# Patient Record
Sex: Female | Born: 1940 | Race: Black or African American | Hispanic: No | Marital: Married | Smoking: Never smoker
Health system: Southern US, Community
[De-identification: ages and names within clinical notes are randomized; demographics above are authoritative.]

## PROBLEM LIST (undated history)

## (undated) DIAGNOSIS — I471 Supraventricular tachycardia, unspecified: Secondary | ICD-10-CM

## (undated) DIAGNOSIS — IMO0001 Reserved for inherently not codable concepts without codable children: Secondary | ICD-10-CM

## (undated) DIAGNOSIS — I119 Hypertensive heart disease without heart failure: Secondary | ICD-10-CM

## (undated) DIAGNOSIS — I5189 Other ill-defined heart diseases: Secondary | ICD-10-CM

## (undated) DIAGNOSIS — E119 Type 2 diabetes mellitus without complications: Secondary | ICD-10-CM

## (undated) DIAGNOSIS — I2510 Atherosclerotic heart disease of native coronary artery without angina pectoris: Secondary | ICD-10-CM

## (undated) DIAGNOSIS — I739 Peripheral vascular disease, unspecified: Secondary | ICD-10-CM

## (undated) DIAGNOSIS — I639 Cerebral infarction, unspecified: Secondary | ICD-10-CM

## (undated) DIAGNOSIS — E785 Hyperlipidemia, unspecified: Secondary | ICD-10-CM

## (undated) DIAGNOSIS — Z794 Long term (current) use of insulin: Secondary | ICD-10-CM

## (undated) HISTORY — DX: Hyperlipidemia, unspecified: E78.5

## (undated) HISTORY — DX: Peripheral vascular disease, unspecified: I73.9

## (undated) HISTORY — DX: Long term (current) use of insulin: Z79.4

## (undated) HISTORY — PX: EYE SURGERY: SHX253

## (undated) HISTORY — DX: Cerebral infarction, unspecified: I63.9

## (undated) HISTORY — DX: Hypertensive heart disease without heart failure: I11.9

## (undated) HISTORY — DX: Reserved for inherently not codable concepts without codable children: IMO0001

## (undated) HISTORY — DX: Type 2 diabetes mellitus without complications: E11.9

## (undated) HISTORY — DX: Other ill-defined heart diseases: I51.89

## (undated) HISTORY — PX: ABDOMINAL HYSTERECTOMY: SHX81

## (undated) HISTORY — PX: CARDIAC CATHETERIZATION: SHX172

---

## 2004-10-04 ENCOUNTER — Emergency Department: Payer: Self-pay | Admitting: Emergency Medicine

## 2004-10-04 ENCOUNTER — Other Ambulatory Visit: Payer: Self-pay

## 2004-10-04 IMAGING — CT CT CHEST W/ CM
1 of 2 series · 16 of 32 positions shown, 20 images · IV contrast (APPLIED)
Comparison: none

REASON FOR EXAM: chest pain
COMMENTS:

[Series 6: inspace · axial · 0.85mm/px · z∈[-722,-428]mm · 16 of 463 slices shown, 20 images]
[im 22/463  mediastinal]
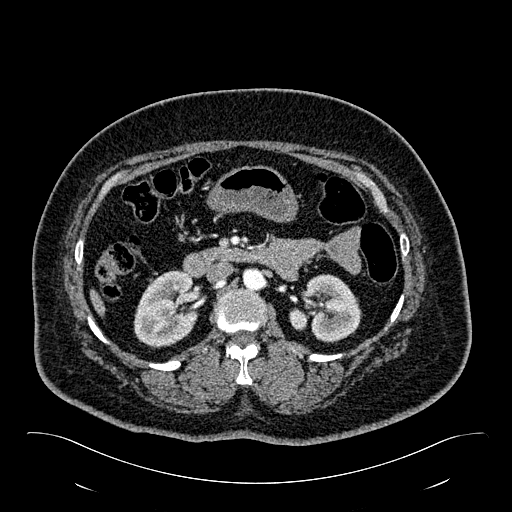
[im 22/463  lung]
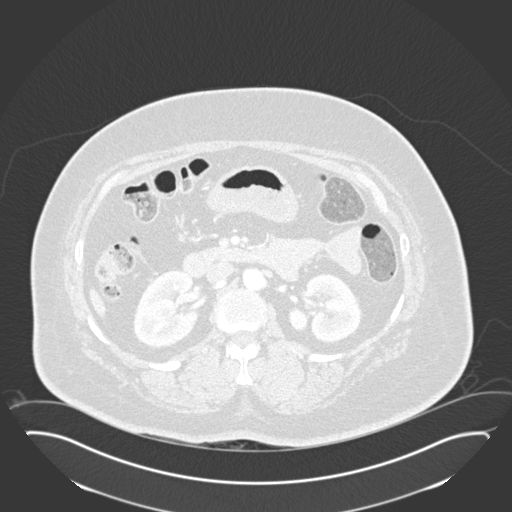
[im 64/463  lung]
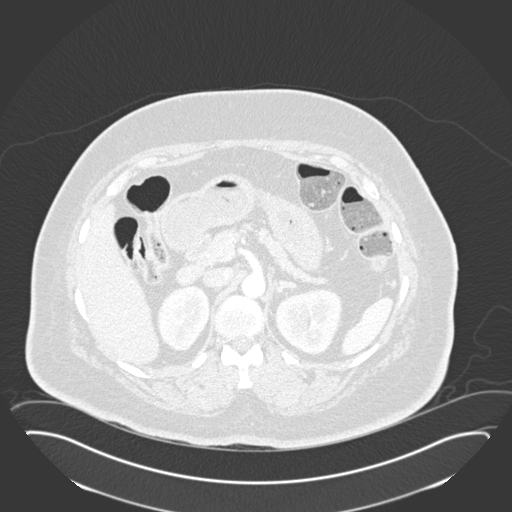
[im 85/463  lung]
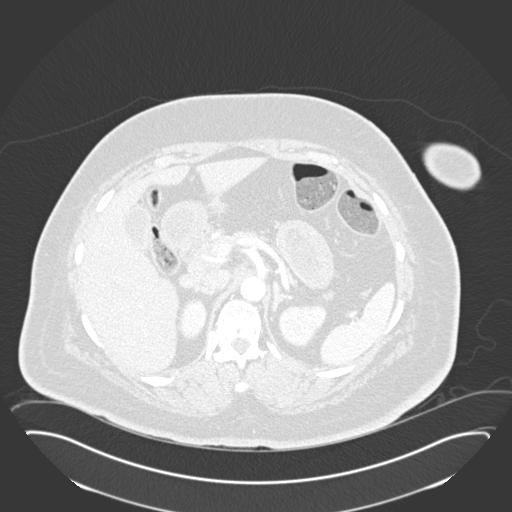
[im 116/463  lung]
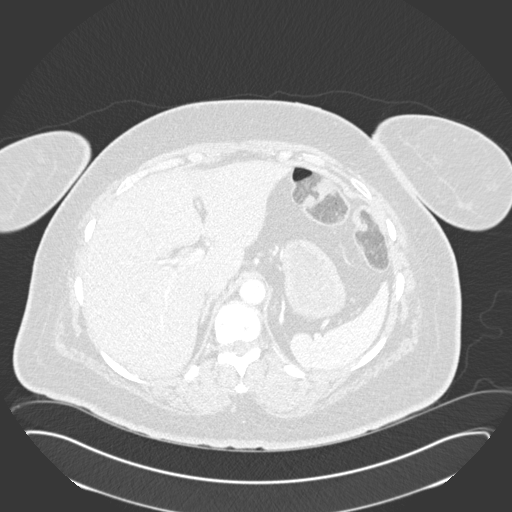
[im 127/463  mediastinal]
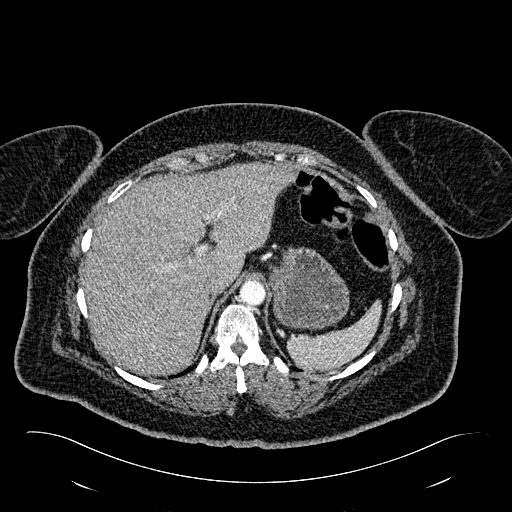
[im 127/463  lung]
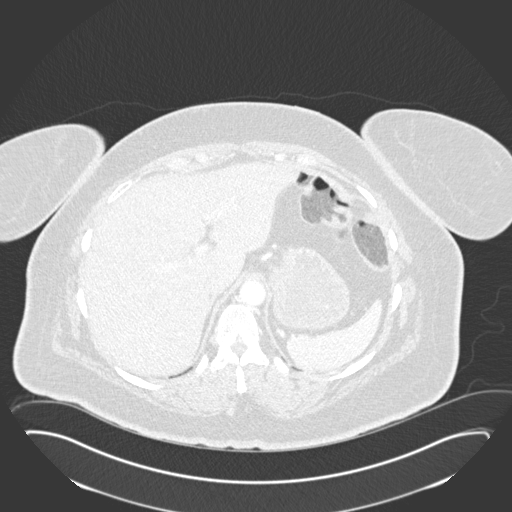
[im 169/463  lung]
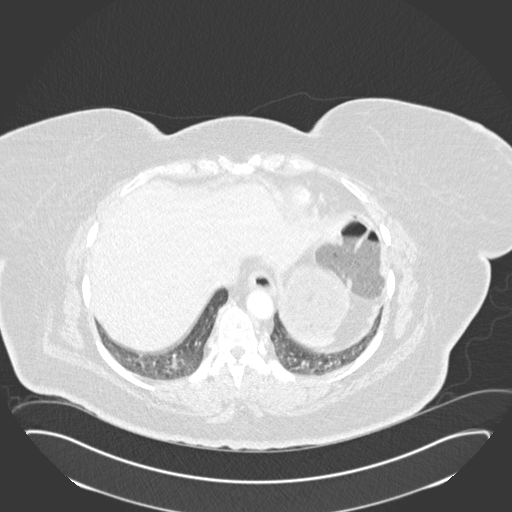
[im 190/463  lung]
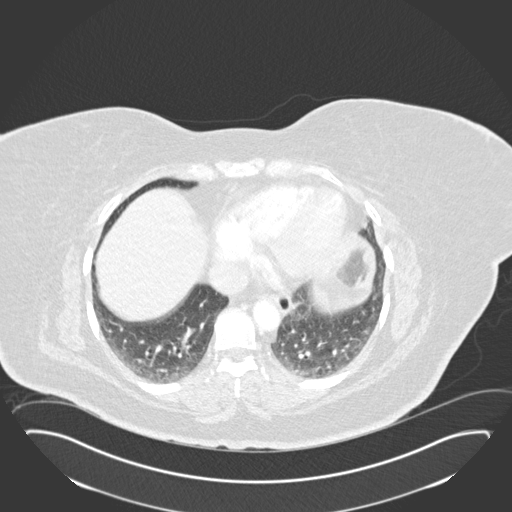
[im 218/463  lung]
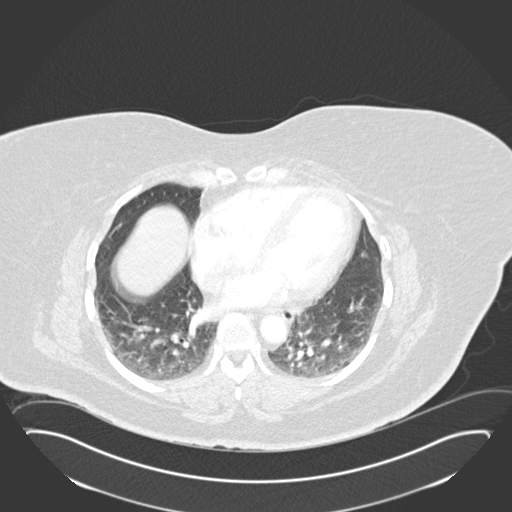
[im 232/463  mediastinal]
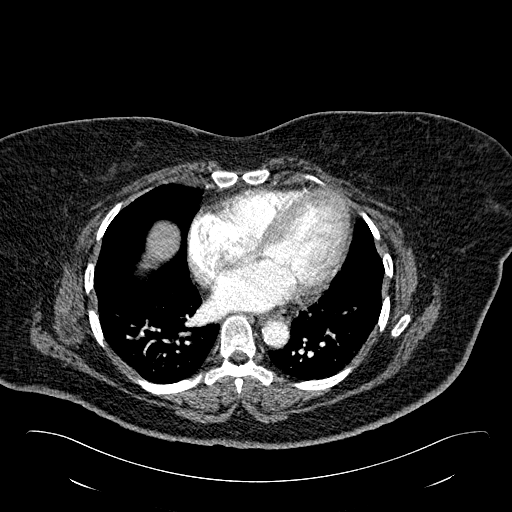
[im 232/463  lung]
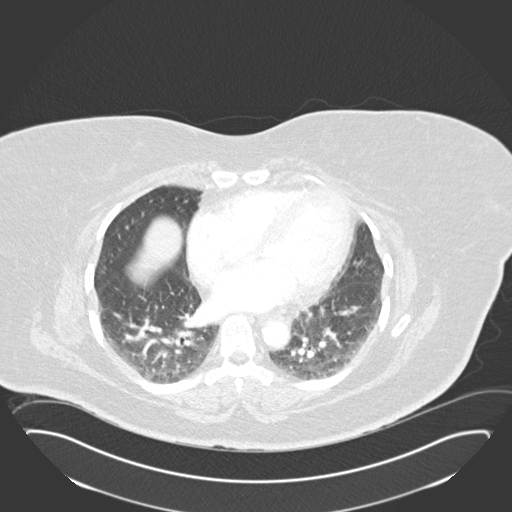
[im 274/463  lung]
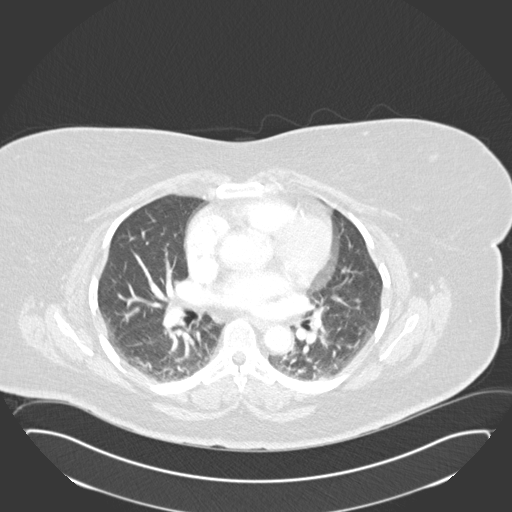
[im 295/463  lung]
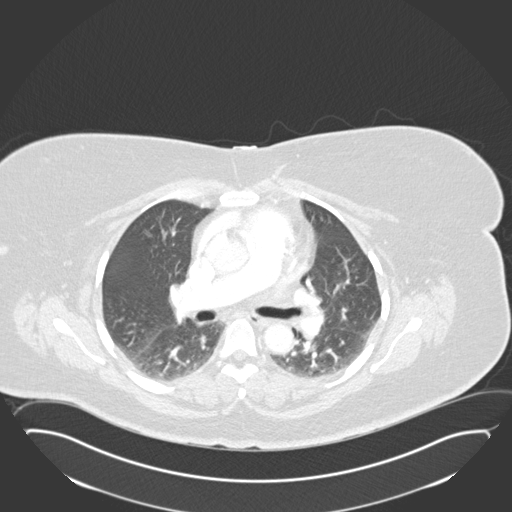
[im 337/463  lung]
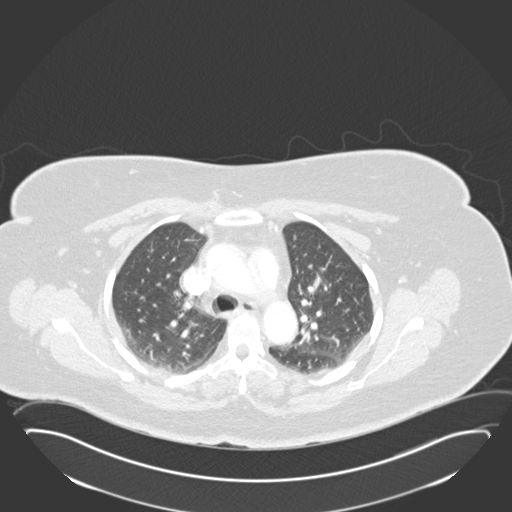
[im 347/463  mediastinal]
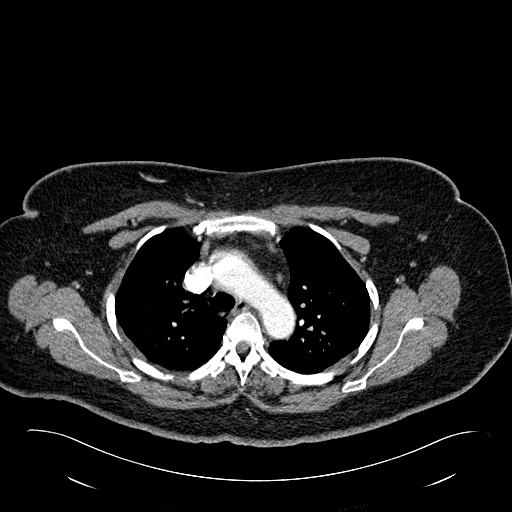
[im 347/463  lung]
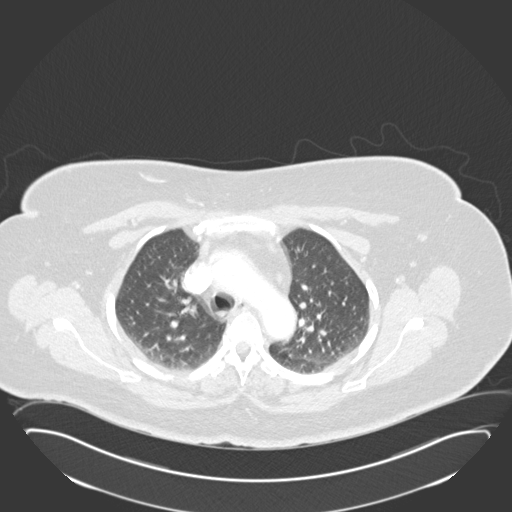
[im 379/463  lung]
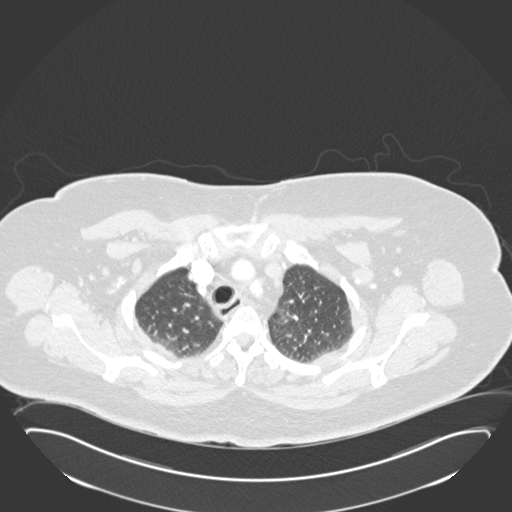
[im 400/463  lung]
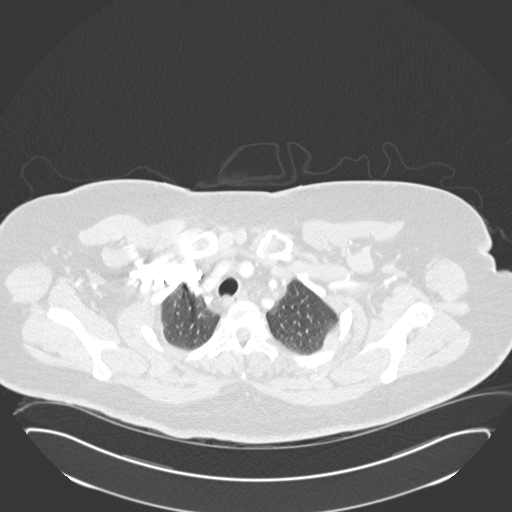
[im 442/463  lung]
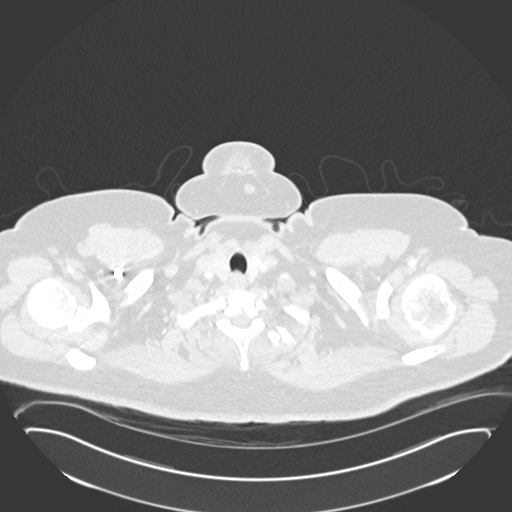

[16 of 32 positions shown; findings below may reference images not displayed]

PROCEDURE:     CT  - CT CHEST (FOR PE) W  - [DATE] [DATE]

RESULT:     3 mm helical cuts were performed through the chest with 100 ccs
of Isovue 370 contrast. No prior studies are available for comparison.  The
pulmonary arteries enhance normally without evidence of filling defect or
wall thickening.  No evidence of pleural or pericardial effusion. The lung
windows do not show evidence of a suspicious mass, nodule or pneumonia.
There is some subtle dependent atelectasis at both lung bases.  The thoracic
aorta tapers normally without evidence of aneurysm or dissection.  Limited
cuts through the upper abdomen do not show a suspicious solid organ
abnormality.
IMPRESSION: 1)No evidence of PE or other acute pulmonary abnormality.

2)No pleural or pericardial effusion or evidence of thoracic aortic aneurysm
or dissection.

3)No suspicious mass, nodule or pneumonia in either lung field.

## 2004-10-05 ENCOUNTER — Ambulatory Visit: Payer: Self-pay | Admitting: Emergency Medicine

## 2004-12-14 ENCOUNTER — Other Ambulatory Visit: Payer: Self-pay

## 2004-12-14 ENCOUNTER — Emergency Department: Payer: Self-pay | Admitting: Internal Medicine

## 2004-12-14 IMAGING — CT CT HEAD WITHOUT CONTRAST
1 series · 16 of 28 positions shown, 20 images · non-contrast
Comparison: none

REASON FOR EXAM: difficulty speaking, slurred speech [HOSPITAL]
COMMENTS:

[Series 2: without · axial · non-contrast · 0.39mm/px · z∈[+354,+480]mm · 16 of 28 slices shown, 20 images]
[im 2/28  brain]
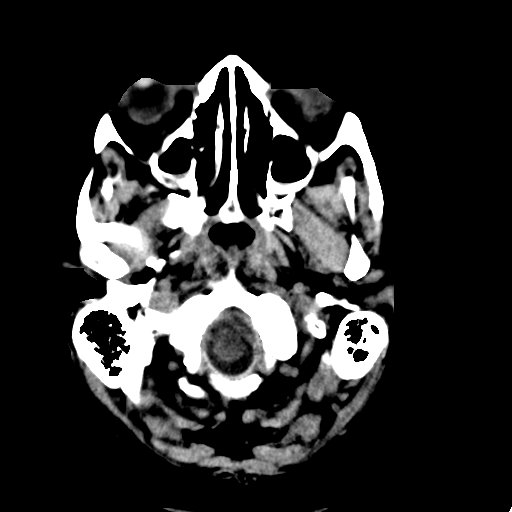
[im 2/28  bone]
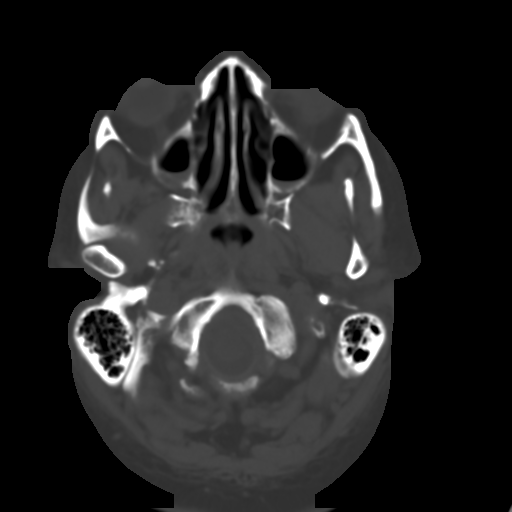
[im 4/28  brain]
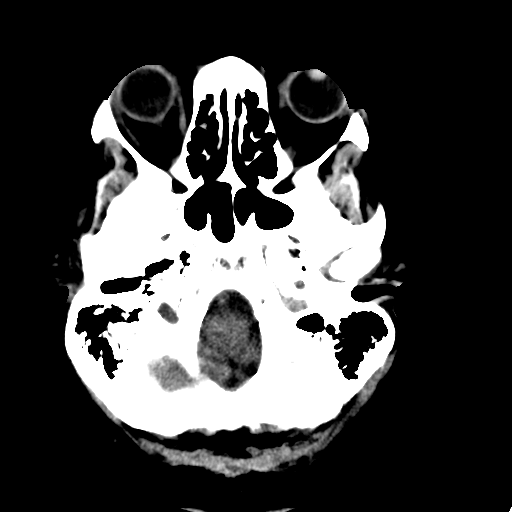
[im 6/28  brain]
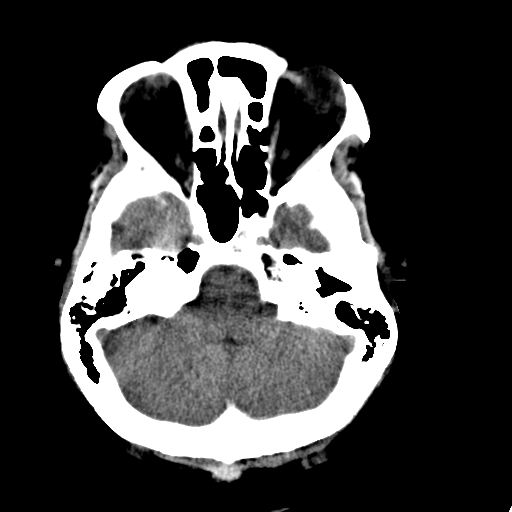
[im 7/28  brain]
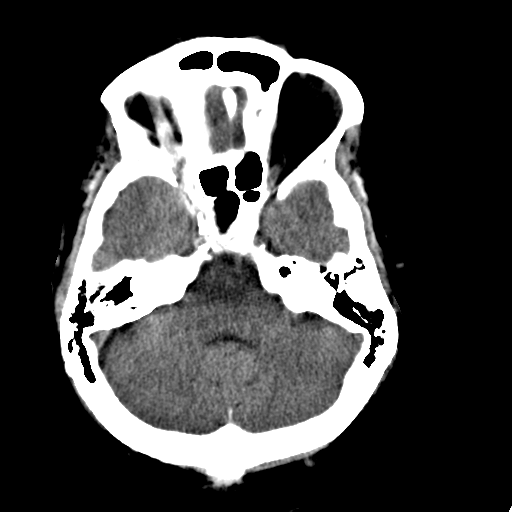
[im 9/28  brain]
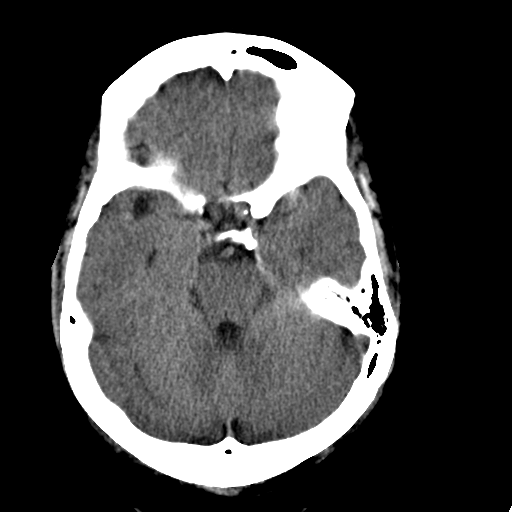
[im 9/28  bone]
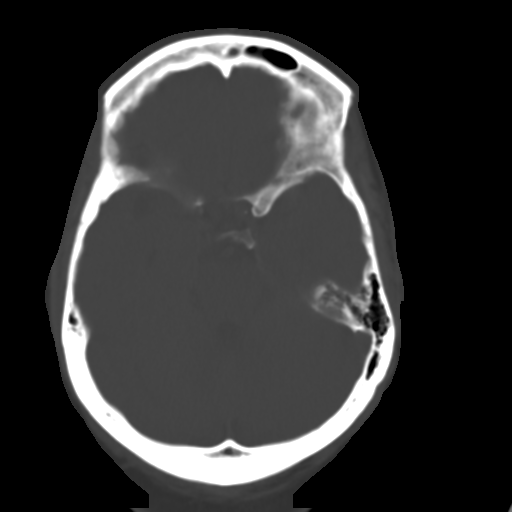
[im 10/28  brain]
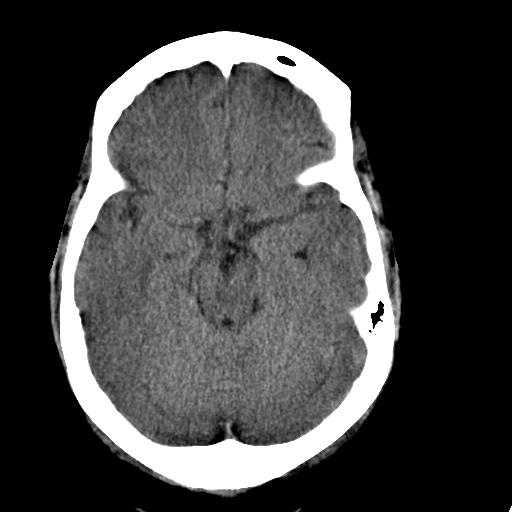
[im 12/28  brain]
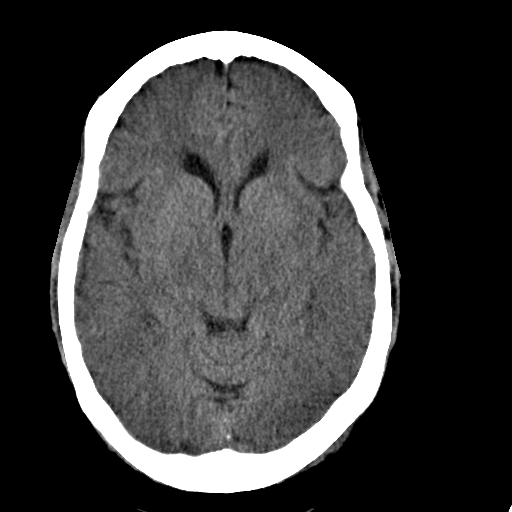
[im 14/28  brain]
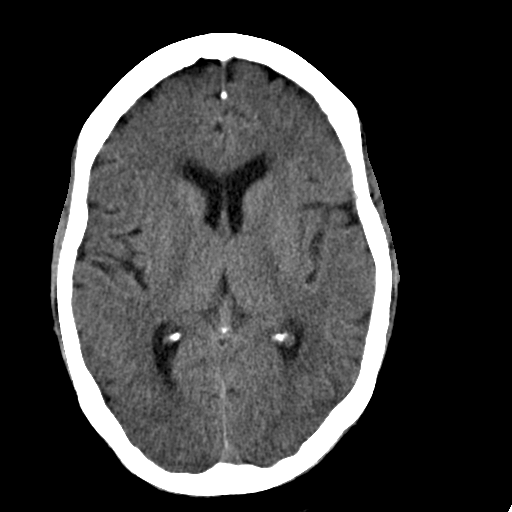
[im 15/28  brain]
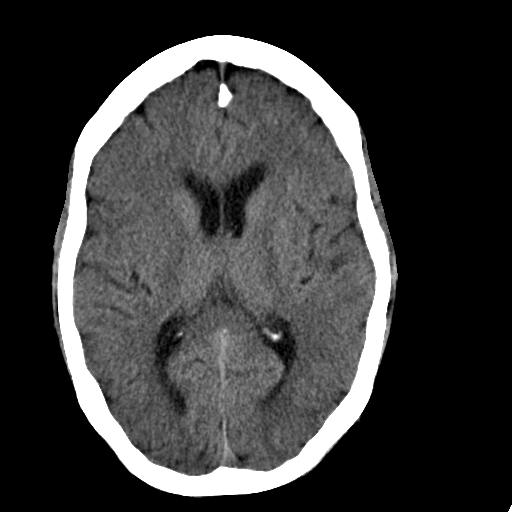
[im 15/28  bone]
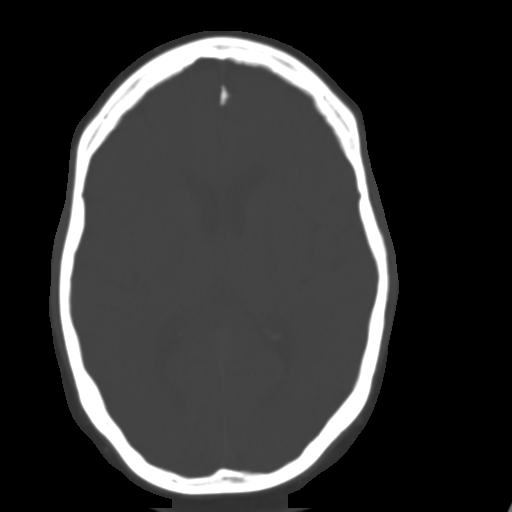
[im 17/28  brain]
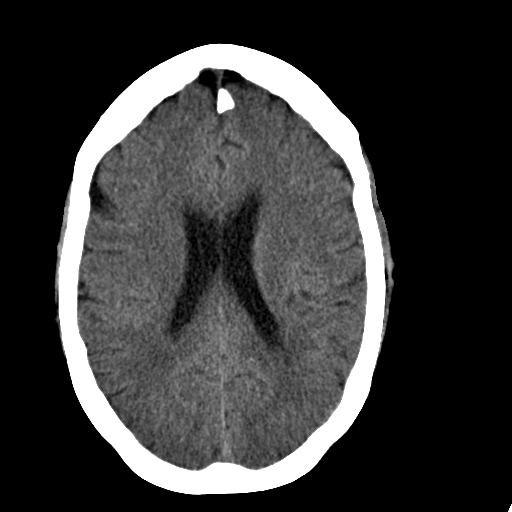
[im 19/28  brain]
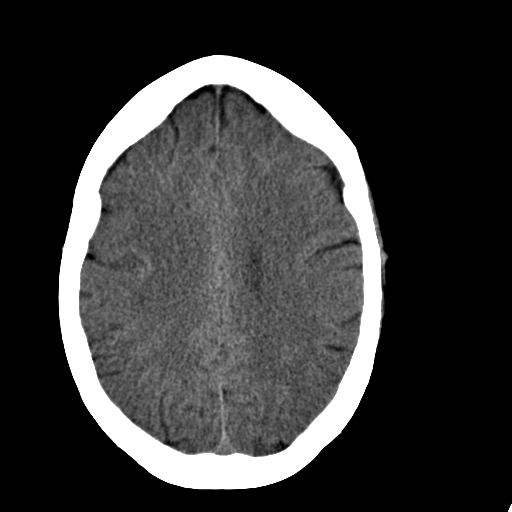
[im 20/28  brain]
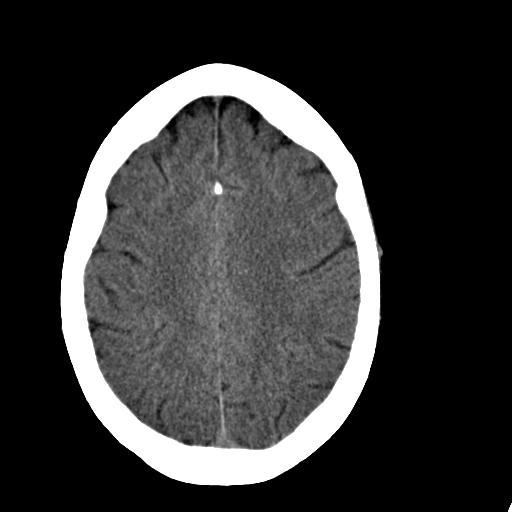
[im 22/28  brain]
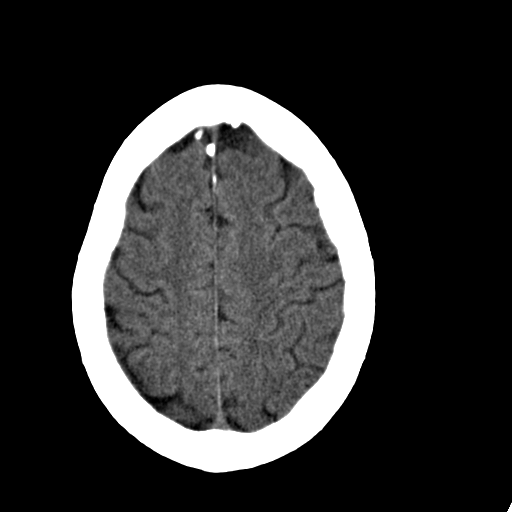
[im 22/28  bone]
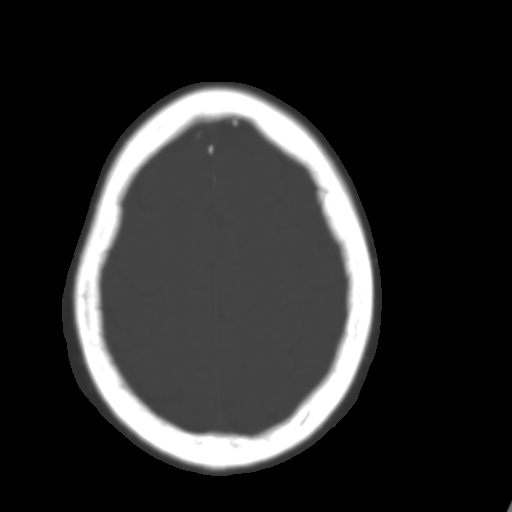
[im 23/28  brain]
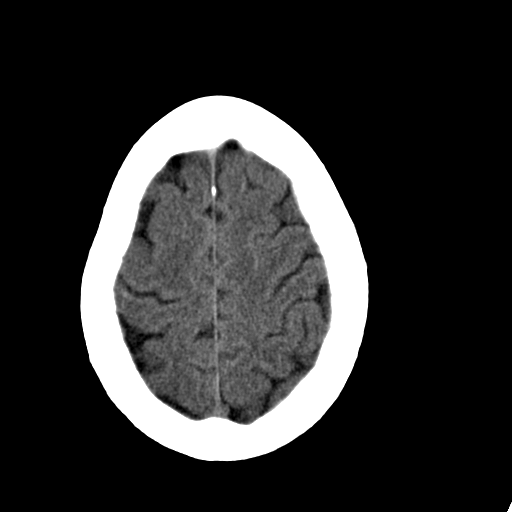
[im 25/28  brain]
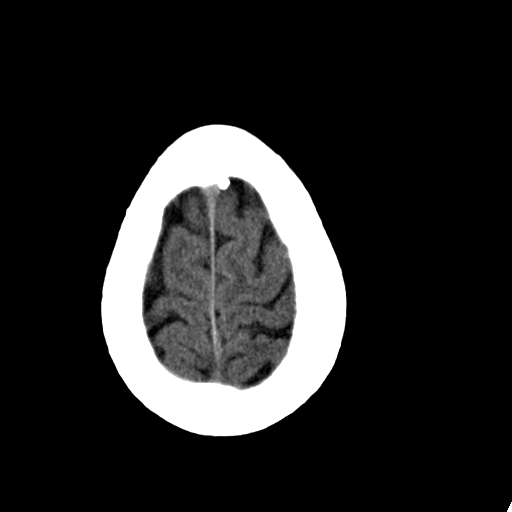
[im 27/28  brain]
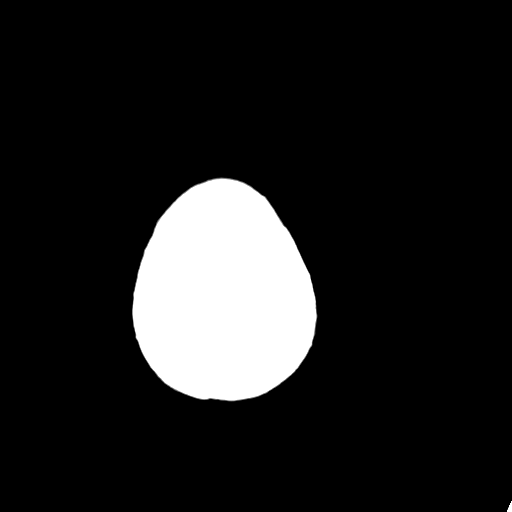

[16 of 28 positions shown; findings below may reference images not displayed]

PROCEDURE:     CT  - CT HEAD WITHOUT CONTRAST  - [DATE]  [DATE]

RESULT:     The patient reports speech disturbances.

The ventricles are normal in size and position. There is no evidence of a
mass nor mass effect.  I see no intracranial hemorrhage. The cerebellum and
brainstem are normal in density and position.  At bone window settings there
are noted to be air fluid levels in both maxillary sinuses. The visualized
portions of the other paranasal sinuses appear normal.
IMPRESSION: 1)I see no acute abnormality of the brain.  Specifically, I see no finding
to suggest an evolving stroke.

2)There is small air fluid levels in both maxillary sinuses.

## 2004-12-15 ENCOUNTER — Ambulatory Visit: Payer: Self-pay | Admitting: Internal Medicine

## 2004-12-15 IMAGING — US US CAROTID DUPLEX BILAT
1 series · 17 of 24 positions shown · non-contrast
Comparison: none

REASON FOR EXAM: Speech Difficulty TIA
COMMENTS:

[Series 1: us carotid duplex bilat · 17 of 56 slices shown]
[im 1/56]
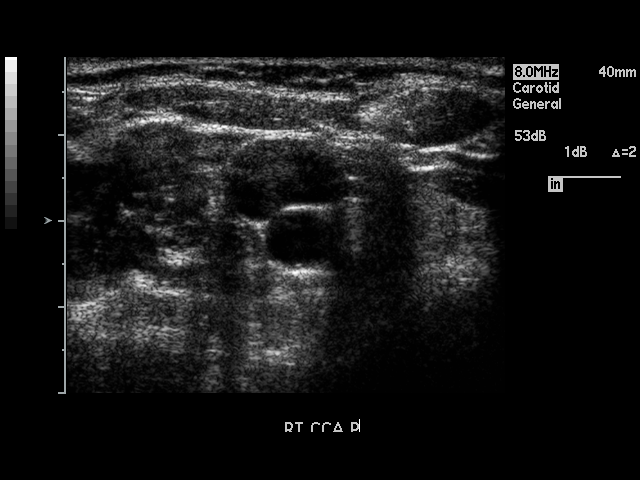
[im 5/56]
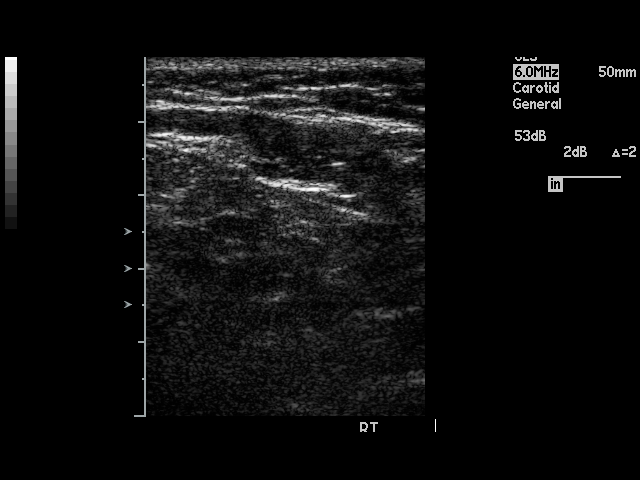
[im 8/56]
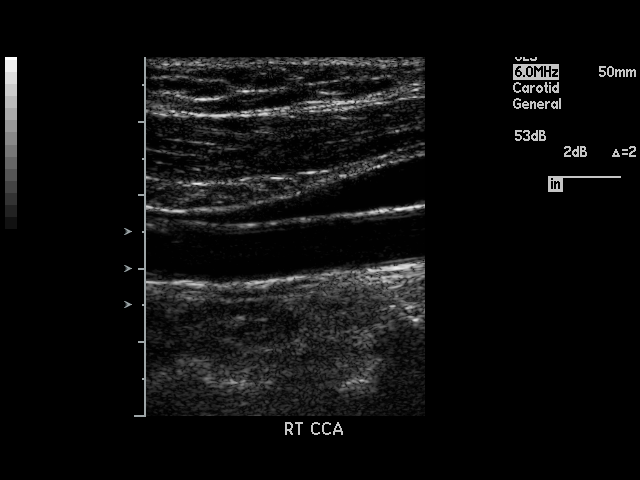
[im 10/56]
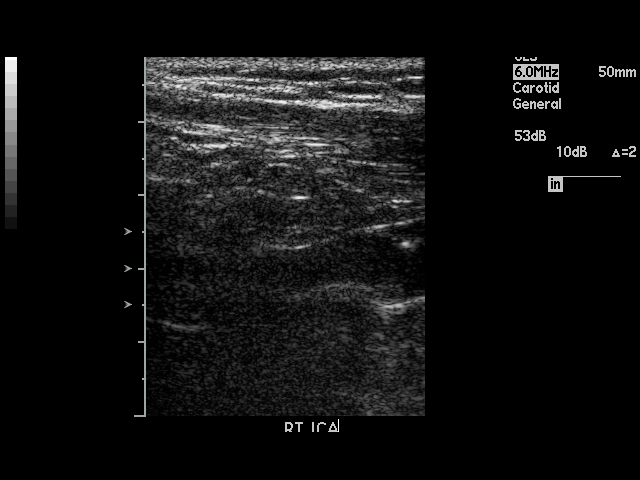
[im 15/56]
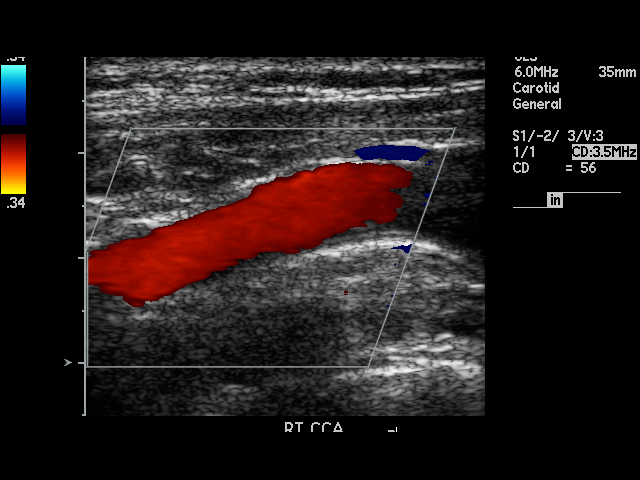
[im 17/56]
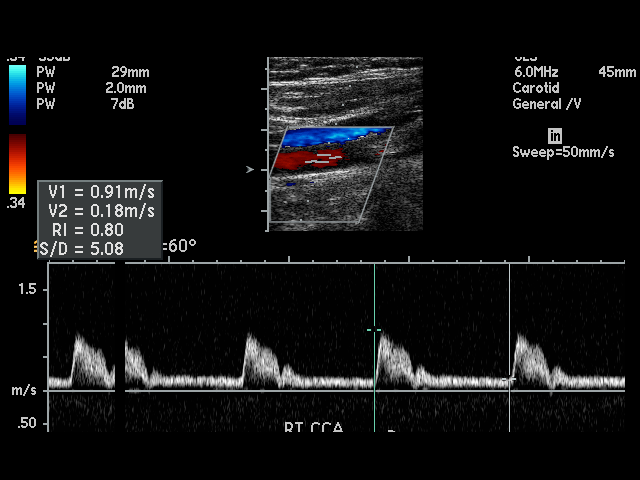
[im 22/56]
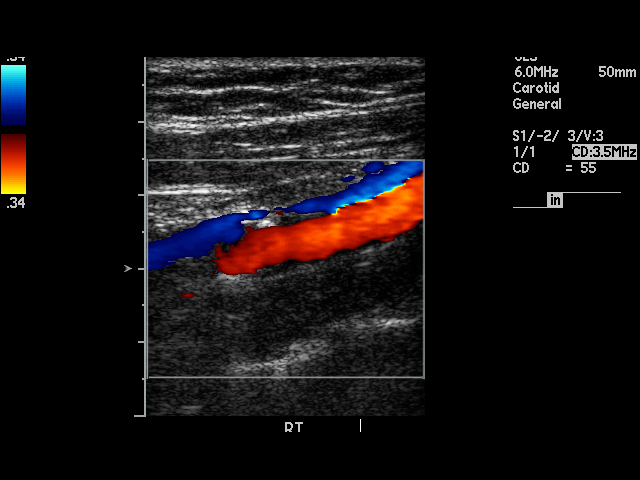
[im 24/56]
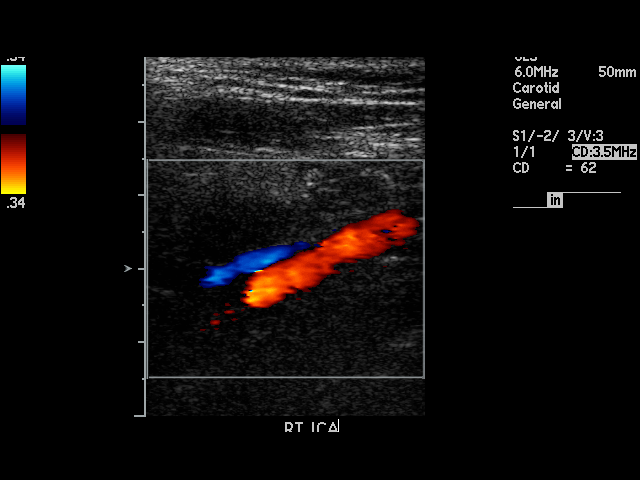
[im 29/56]
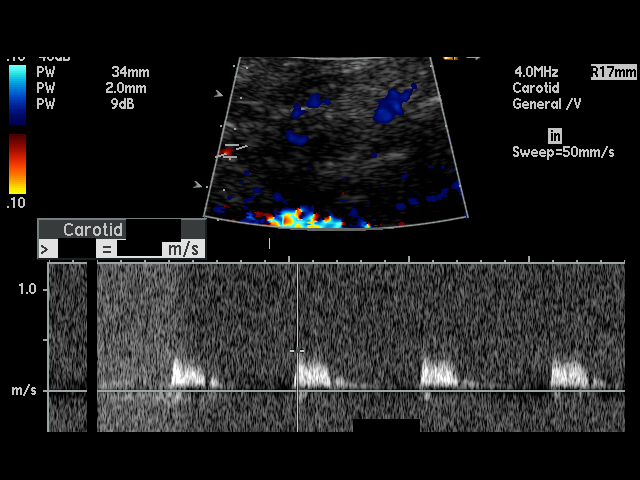
[im 32/56]
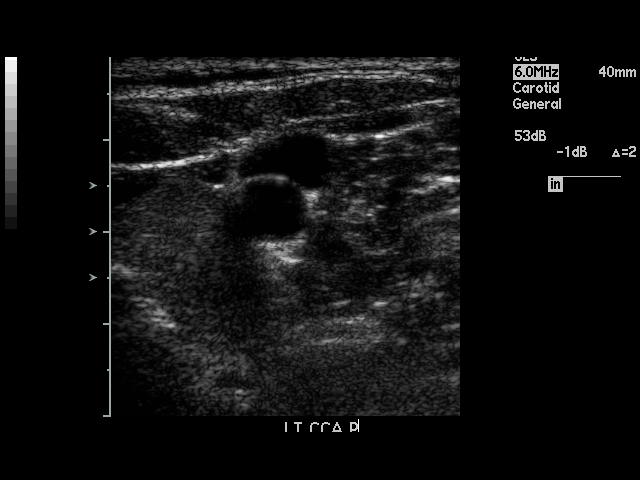
[im 34/56]
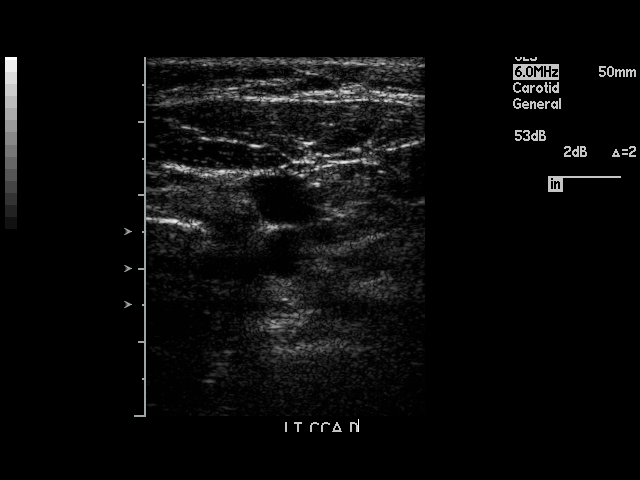
[im 39/56]
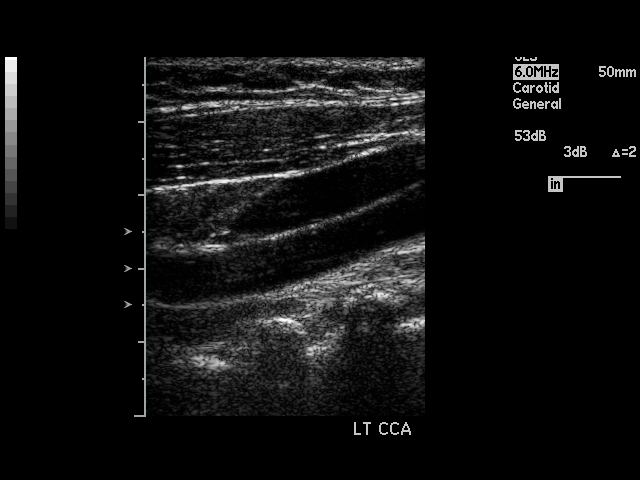
[im 41/56]
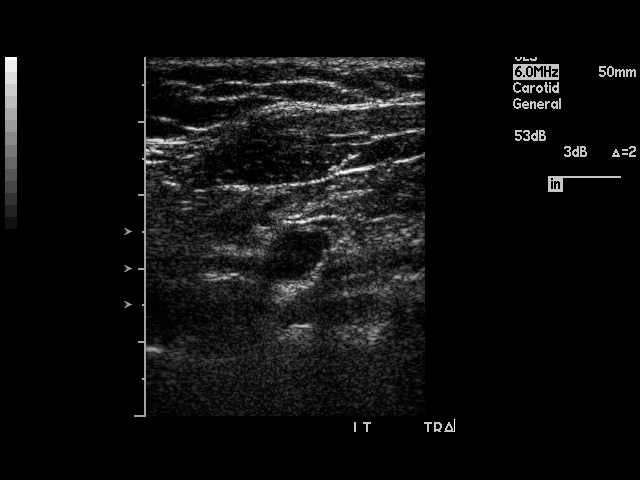
[im 46/56]
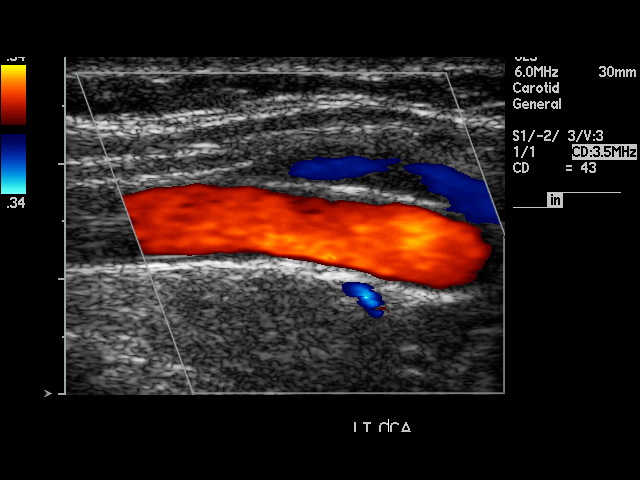
[im 48/56]
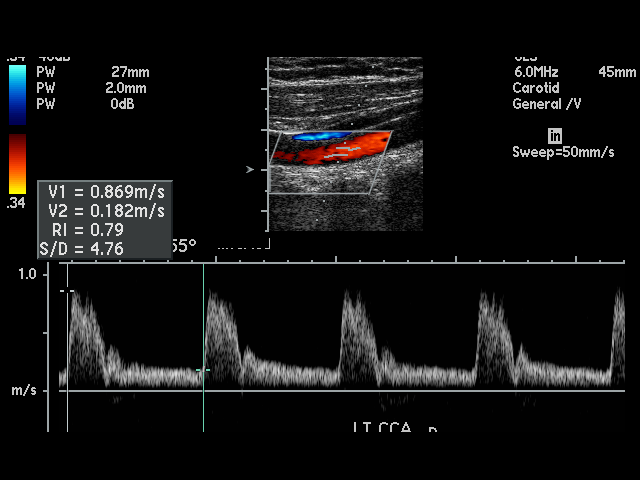
[im 51/56]
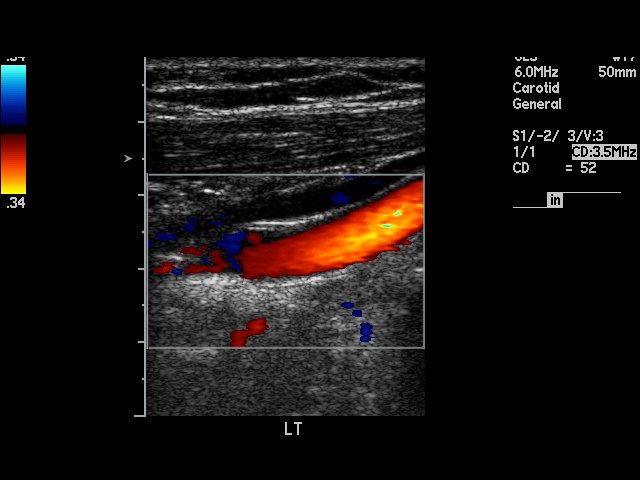
[im 56/56]
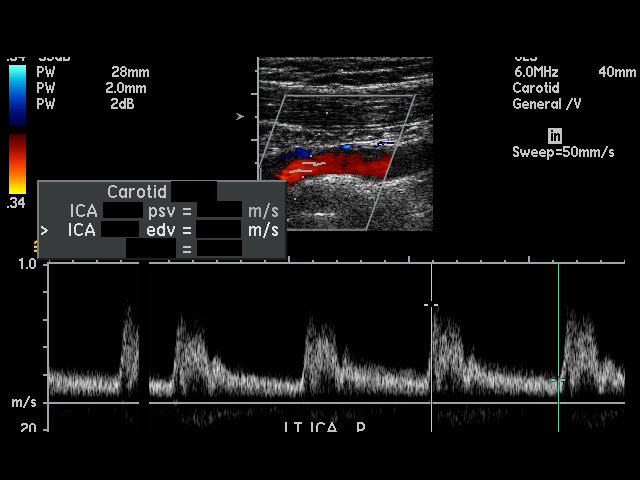

[17 of 24 positions shown; findings below may reference images not displayed]

PROCEDURE:     US  - US CAROTID DOPPLER BILATERAL  - [DATE]  [DATE]

RESULT:        There is noted mild intimal thickening about the carotid
bifurcations bilaterally.  There is slight soft plaque formation at the
carotid bifurcations bilaterally.  On the RIGHT,  the peak RIGHT common
carotid artery flow velocity measures 1.05 meters per second and the peak
RIGHT internal carotid artery flow velocity measures .996 meters per second.
 The IC/CC ratio is 0.95.

On the LEFT,  the peak LEFT common carotid artery flow velocity measures
0.999 meters per second and the peak LEFT internal carotid artery flow
velocity measures .996 meters per second.  The IC/CC ratio is 0.997.  These
values are compatible with the absence of hemodynamically significant
stenosis bilaterally.

Antegrade flow is noted in both vertebrals.
IMPRESSION: 1.     There is noted slight intimal thickening and soft plaque formation
bilaterally.
2.     No hemodynamically significant stenosis is identified on either side.
3.     Antegrade flow is noted in both vertebrals.

## 2004-12-17 ENCOUNTER — Ambulatory Visit: Payer: Self-pay | Admitting: Internal Medicine

## 2005-04-01 ENCOUNTER — Ambulatory Visit: Payer: Self-pay | Admitting: Internal Medicine

## 2005-12-24 ENCOUNTER — Ambulatory Visit: Payer: Self-pay | Admitting: Internal Medicine

## 2006-01-16 ENCOUNTER — Ambulatory Visit: Payer: Self-pay | Admitting: Internal Medicine

## 2006-05-19 ENCOUNTER — Ambulatory Visit: Payer: Self-pay | Admitting: Internal Medicine

## 2006-10-10 ENCOUNTER — Emergency Department: Payer: Self-pay | Admitting: Emergency Medicine

## 2006-10-10 ENCOUNTER — Other Ambulatory Visit: Payer: Self-pay

## 2006-11-24 ENCOUNTER — Ambulatory Visit: Payer: Self-pay | Admitting: Gastroenterology

## 2007-04-11 ENCOUNTER — Ambulatory Visit: Payer: Self-pay | Admitting: Internal Medicine

## 2007-04-11 IMAGING — US ABDOMEN ULTRASOUND
1 series · 17 of 25 positions shown · non-contrast
Comparison: none

REASON FOR EXAM: RUQ pain
COMMENTS:

[Series 1: abdomen ultrasound · 17 of 55 slices shown]
[im 1/55]
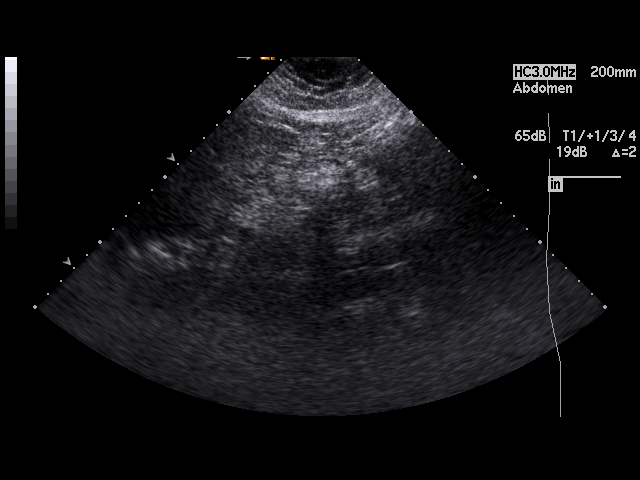
[im 5/55]
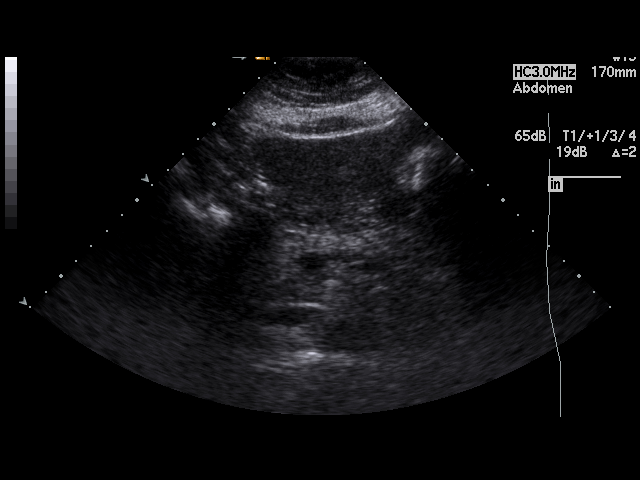
[im 7/55]
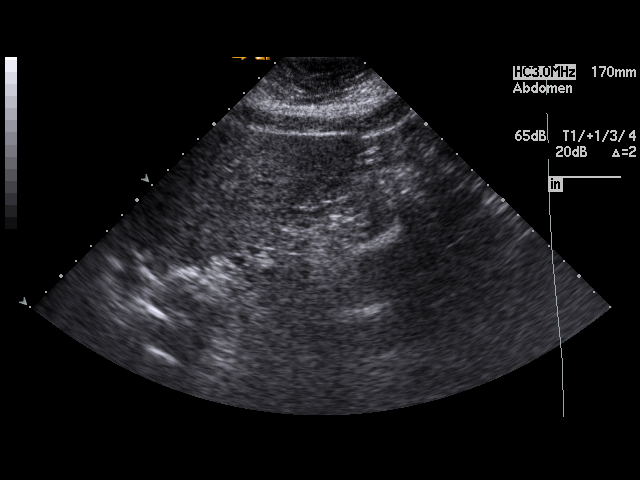
[im 12/55]
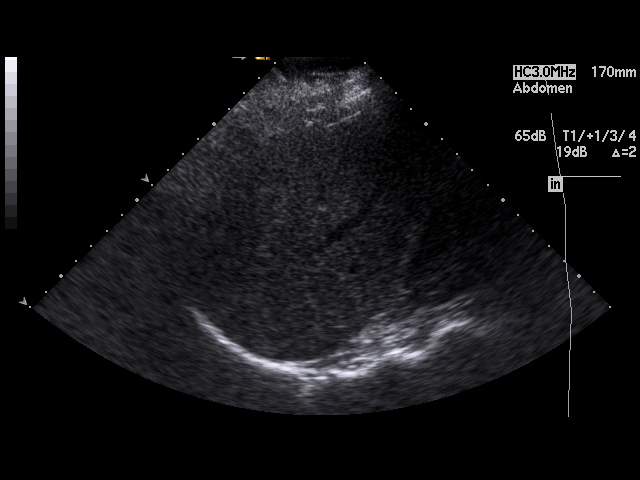
[im 14/55]
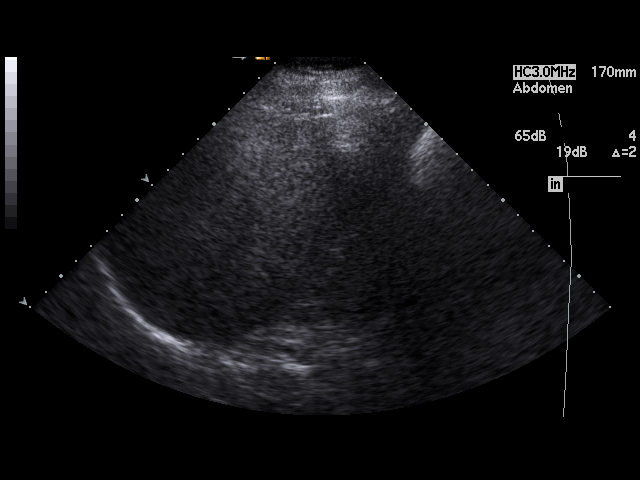
[im 19/55]
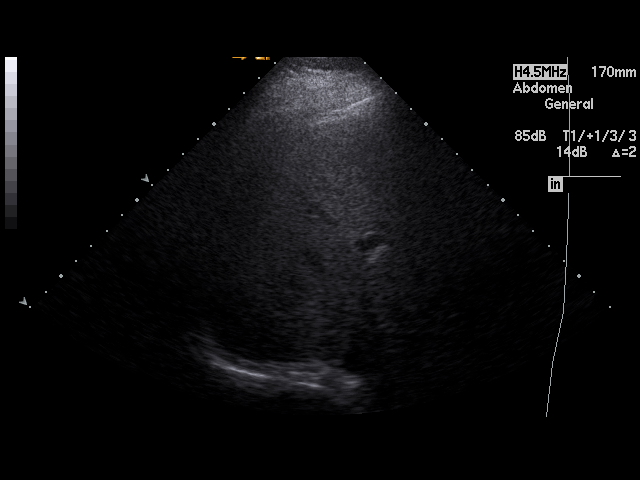
[im 21/55]
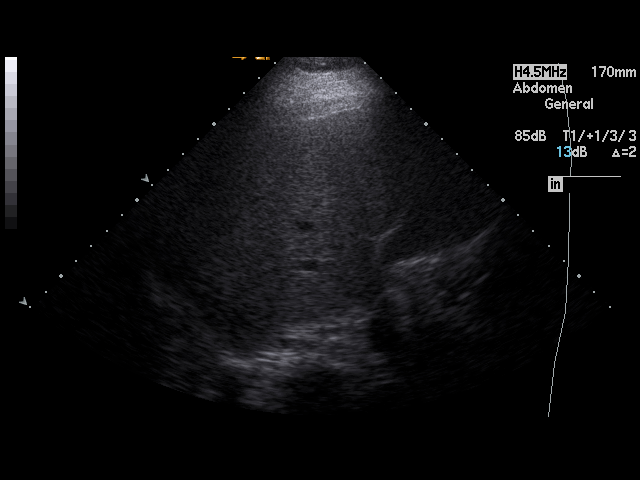
[im 25/55]
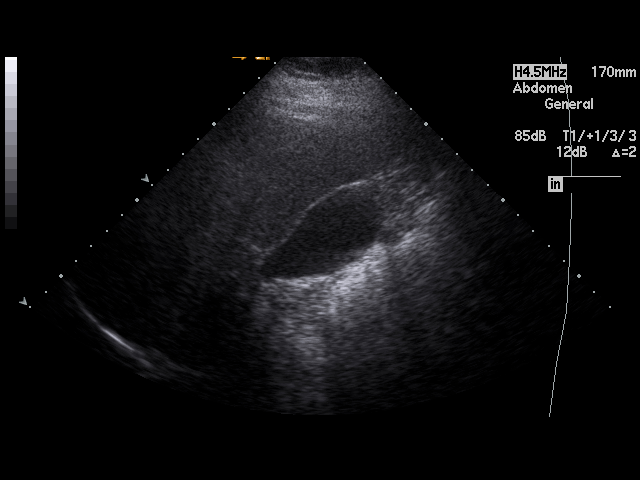
[im 28/55]
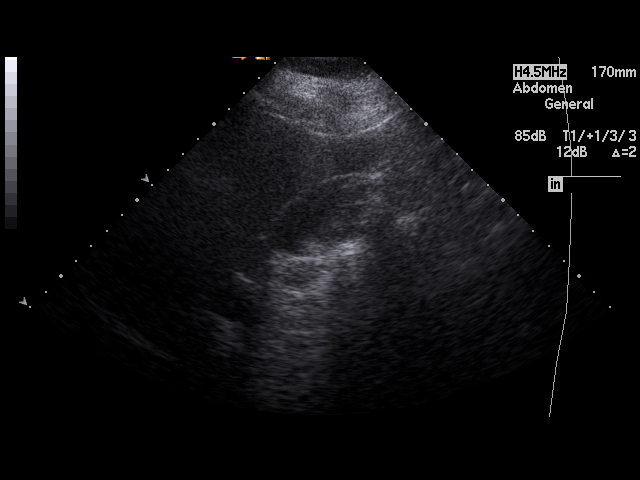
[im 30/55]
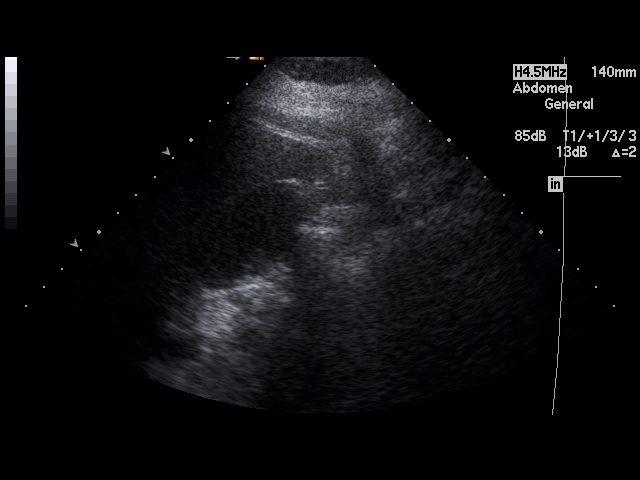
[im 34/55]
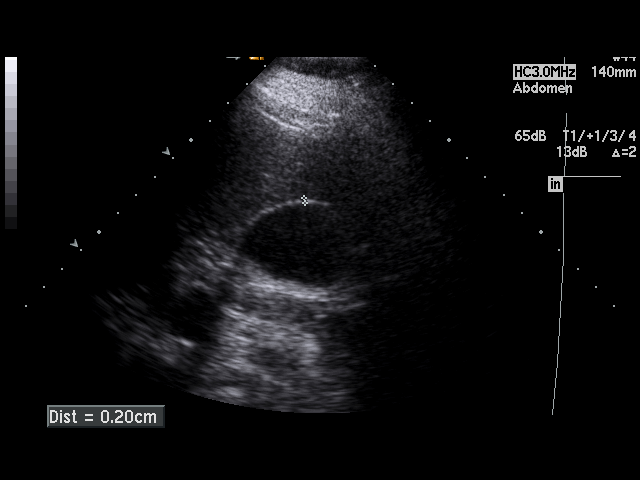
[im 37/55]
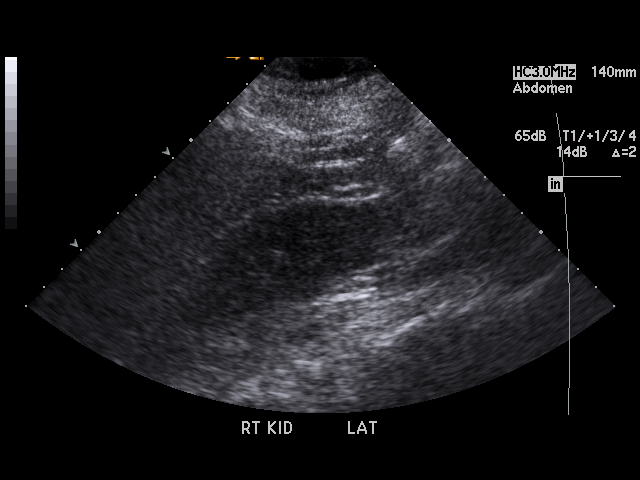
[im 41/55]
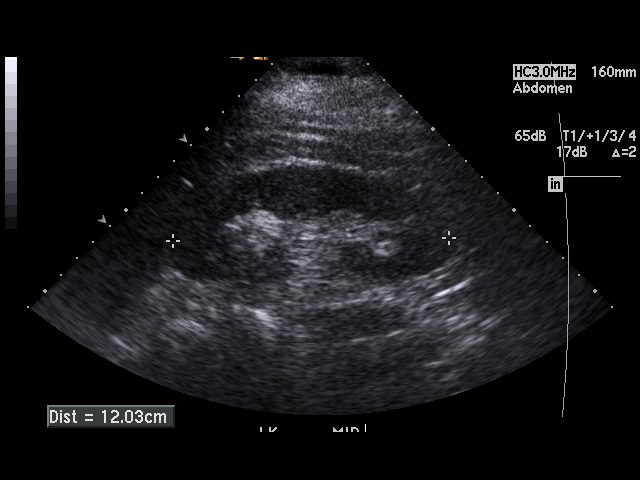
[im 43/55]
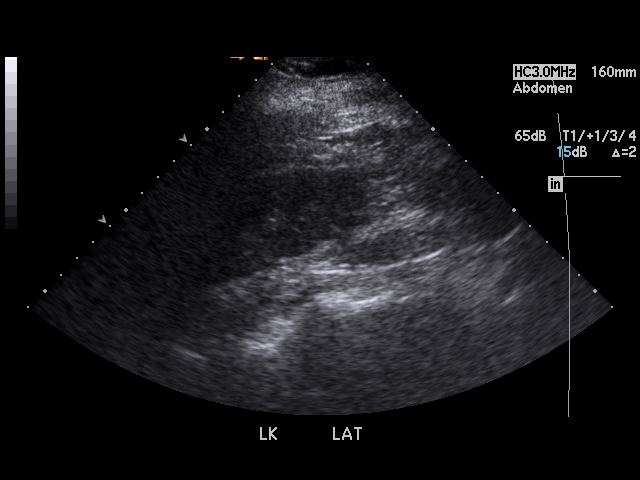
[im 48/55]
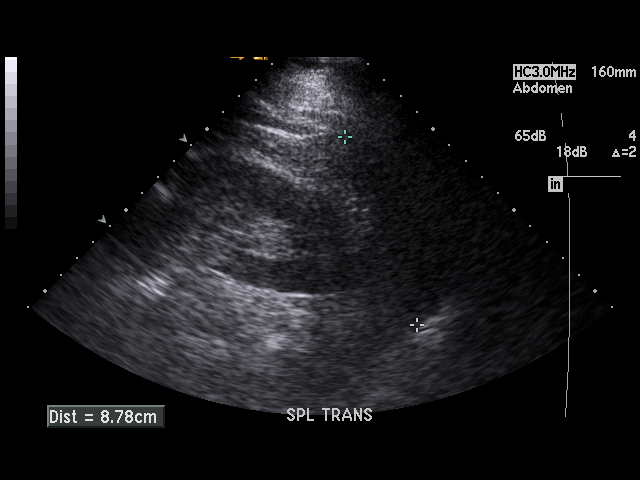
[im 50/55]
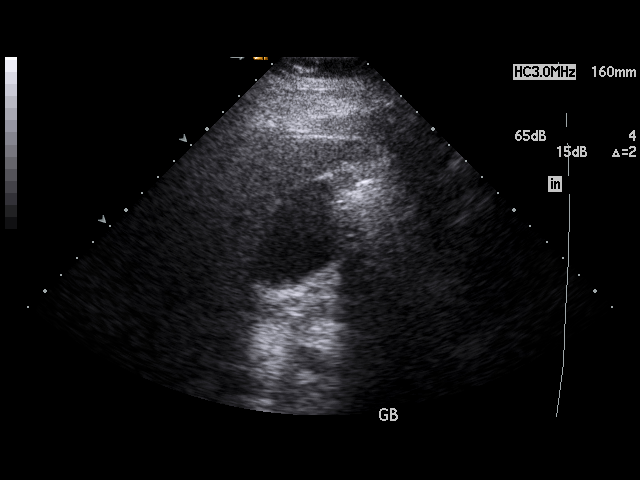
[im 55/55]
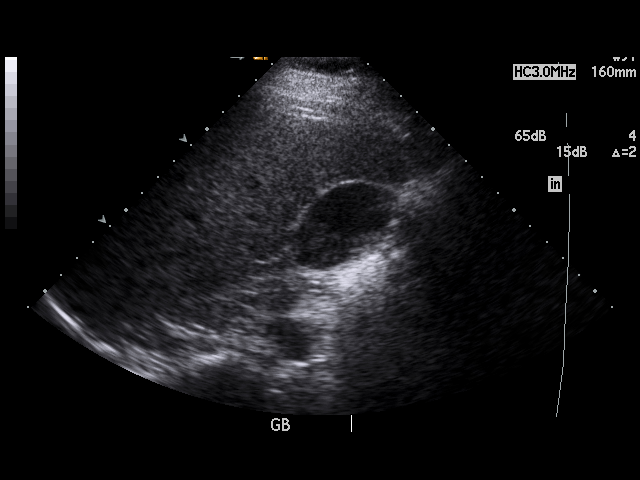

[17 of 25 positions shown; findings below may reference images not displayed]

PROCEDURE:     US  - US ABDOMEN GENERAL SURVEY  - [DATE]  [DATE]

RESULT:     The liver, spleen and pancreas show no specific abnormalities.
There are noted shadowing echo densities in the gallbladder compatible with
gallstones. No thickening of the gallbladder wall is seen. The common bile
duct measures 2.8 mm in diameter which is within normal limits. The kidneys
show no hydronephrosis. There is no ascites.
IMPRESSION: 1. Cholelithiasis.
2. Although not mentioned above, sludge is also observed in the gallbladder.
3. There is no thickening of the gallbladder wall.

## 2007-06-15 ENCOUNTER — Ambulatory Visit: Payer: Self-pay | Admitting: Internal Medicine

## 2008-06-10 ENCOUNTER — Emergency Department: Payer: Self-pay | Admitting: Unknown Physician Specialty

## 2008-07-02 ENCOUNTER — Ambulatory Visit: Payer: Self-pay | Admitting: Internal Medicine

## 2008-08-06 ENCOUNTER — Ambulatory Visit: Payer: Self-pay | Admitting: Family

## 2008-08-18 ENCOUNTER — Ambulatory Visit: Payer: Self-pay | Admitting: Family

## 2008-12-05 IMAGING — CR DG SHOULDER 3+V*L*
1 series · 3 of 3 positions shown · non-contrast
Comparison: none

REASON FOR EXAM: lt shoulder pain
COMMENTS:

PROCEDURE:     DXR - DXR SHOULDER LEFT COMPLETE  - [DATE] [DATE]
RESULT:     Three views of the left shoulder reveal the bones to be mildly
osteopenic. I do not see evidence of an acute fracture. Mild AC joint
degenerative changes present.

[Series 1: view not recorded · 0.17mm/px · 3 of 3 slices shown]
[im 1/3]
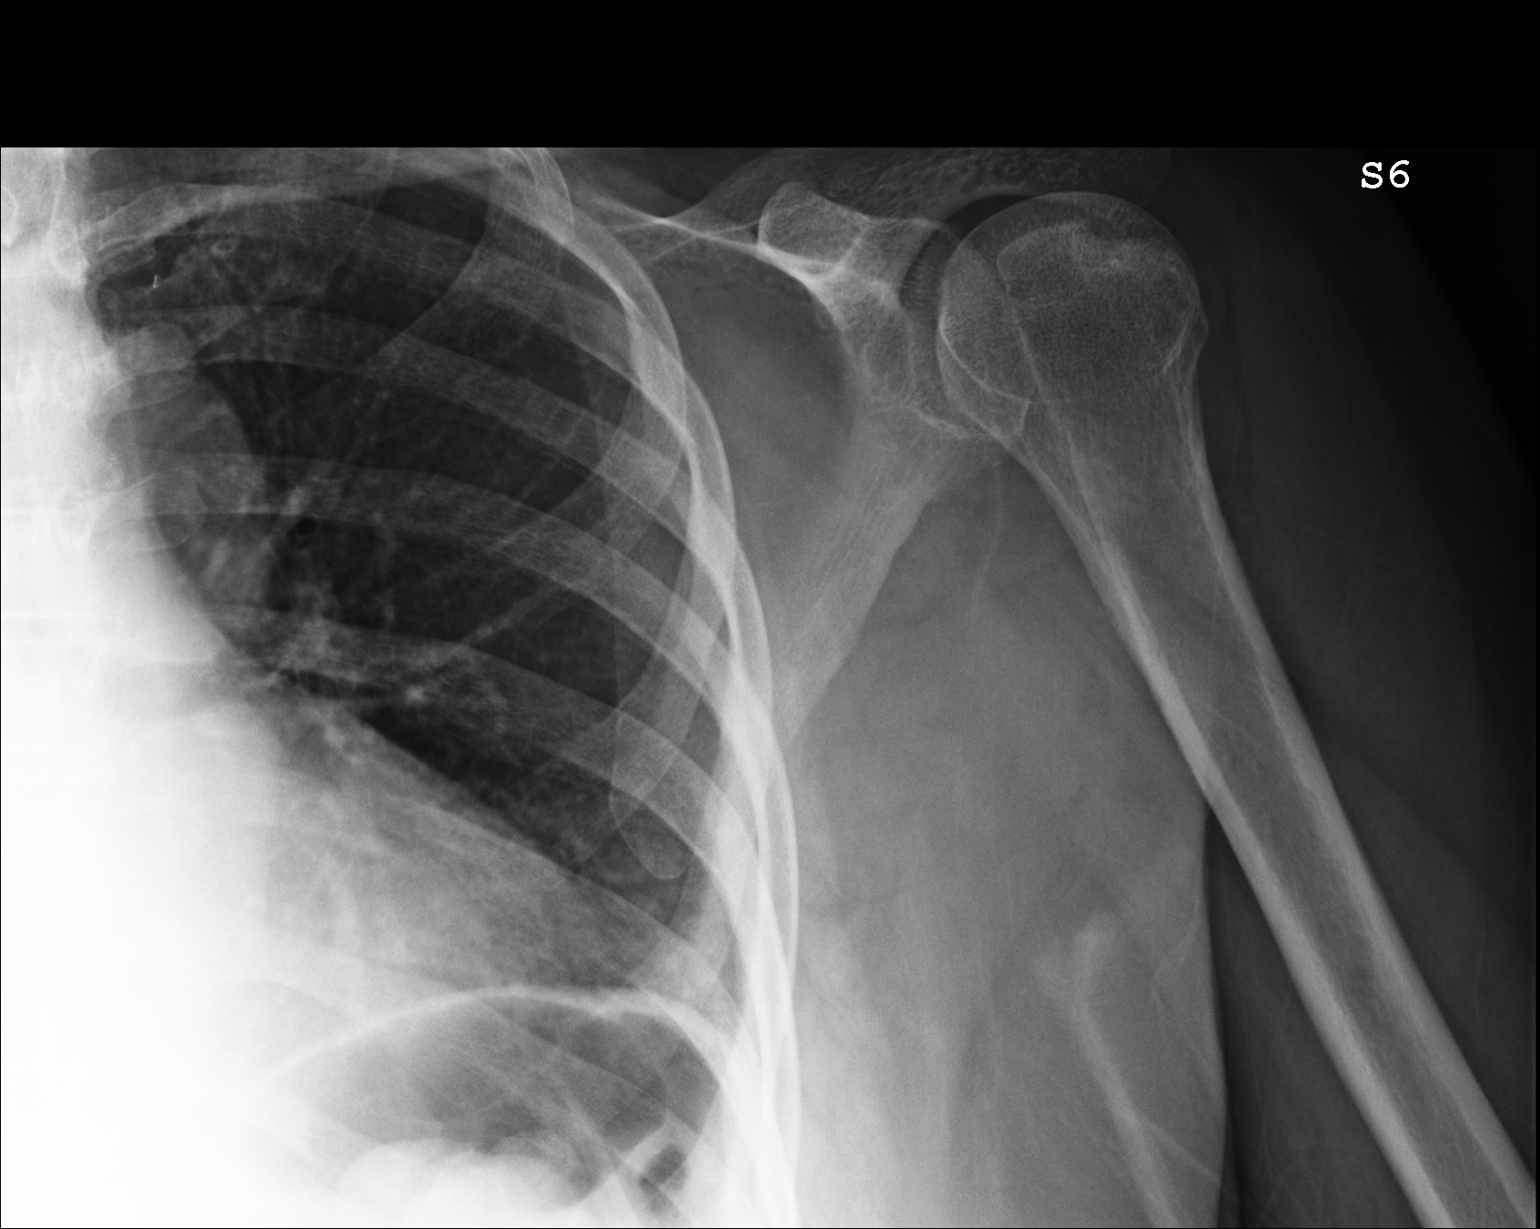
[im 2/3]
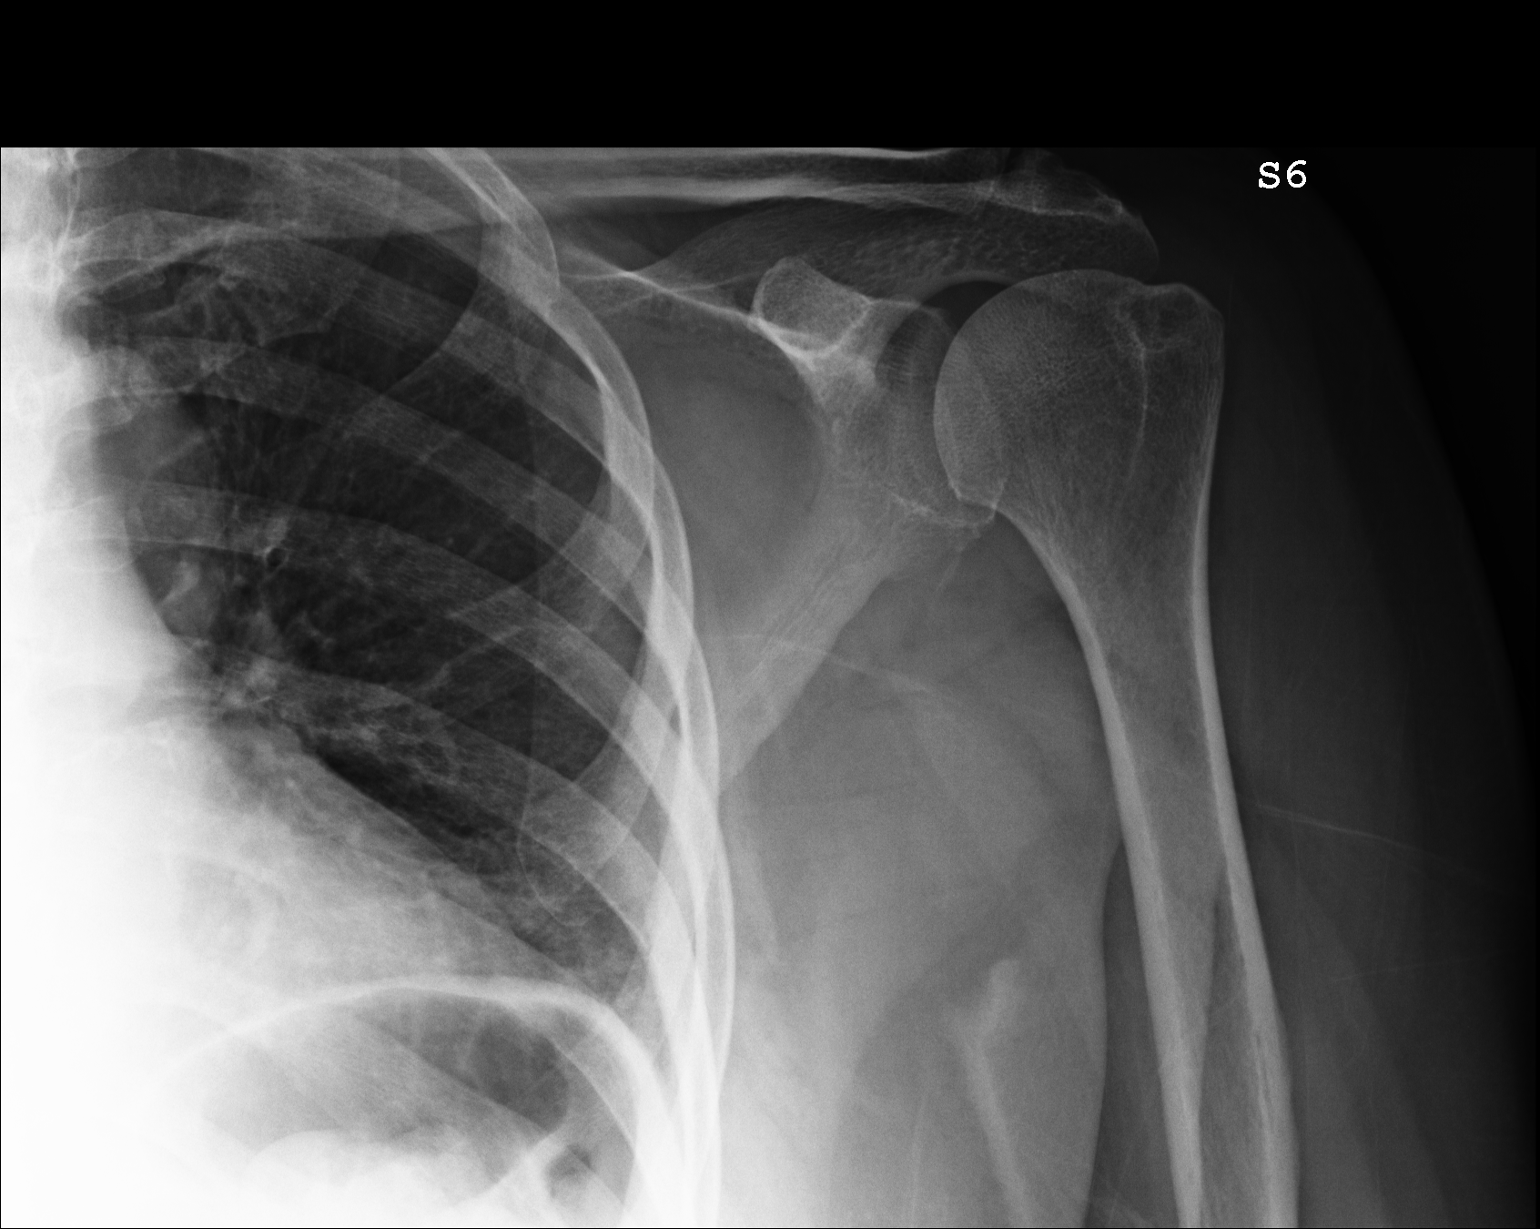
[im 3/3]
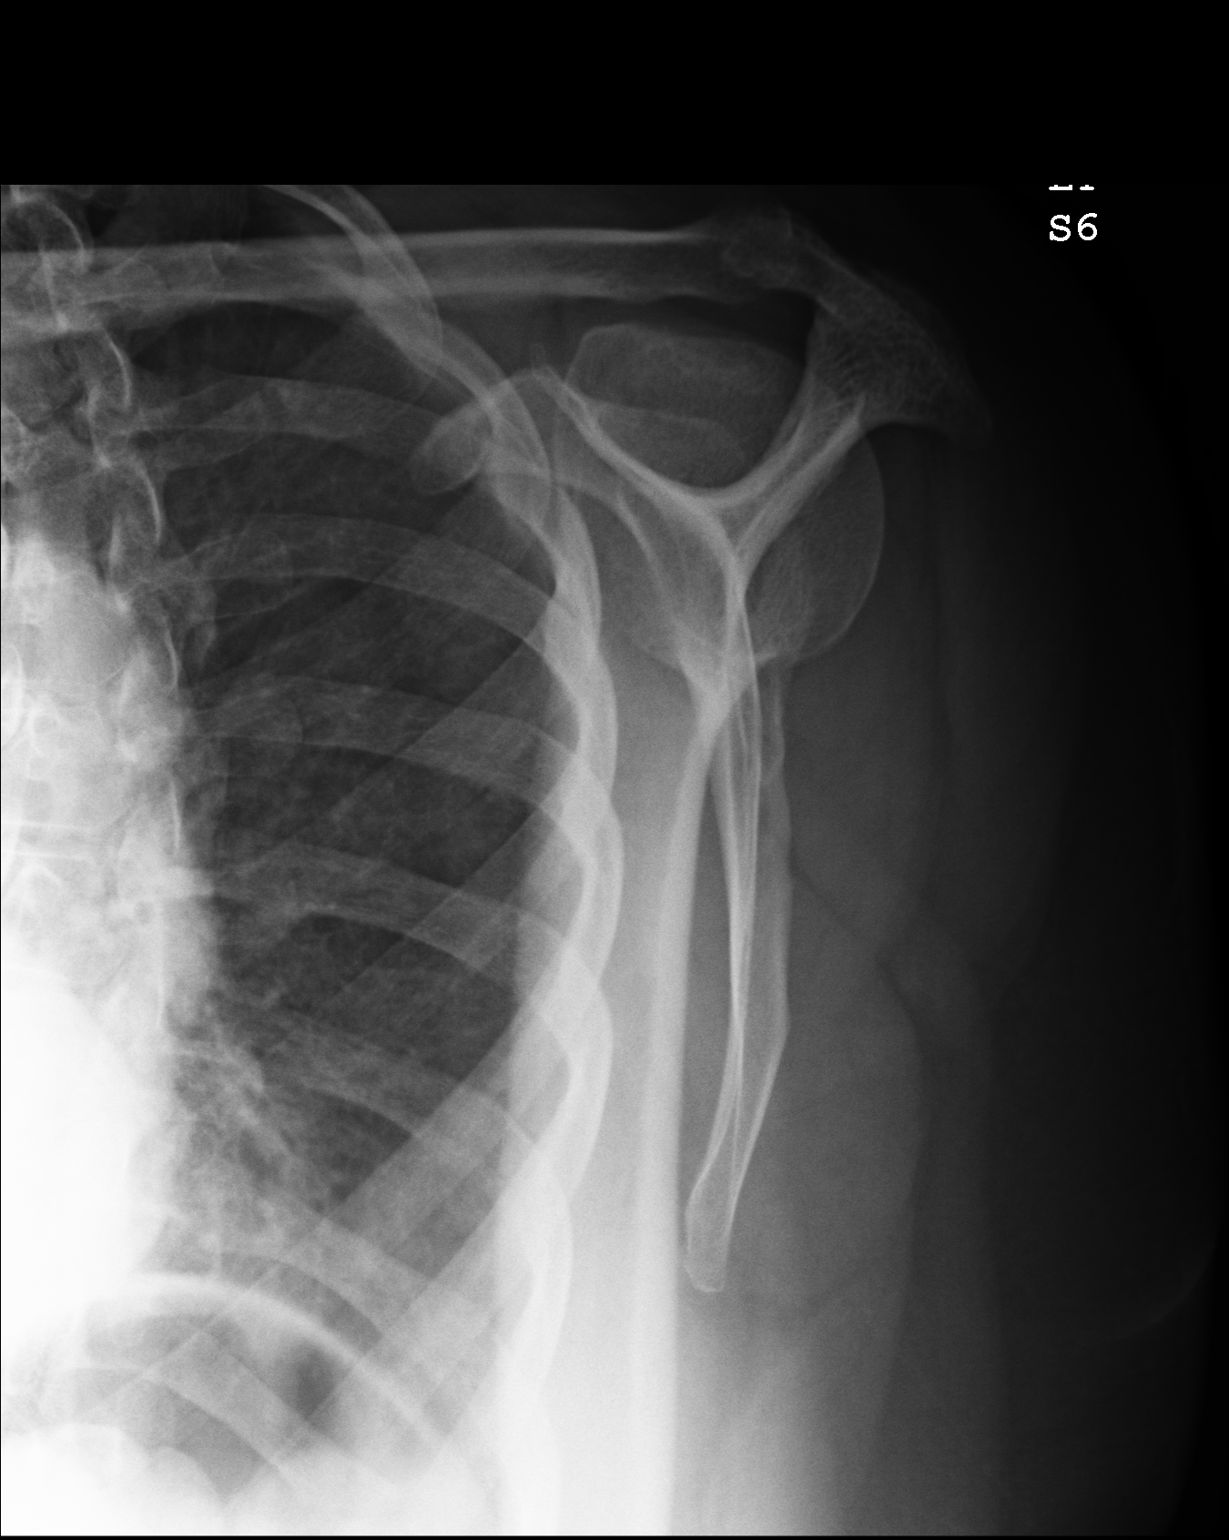

[3 of 3 positions shown; findings below may reference images not displayed]

IMPRESSION: I do not see acute bony abnormality of the left shoulder.
Mild AC joint degenerative changes present.

## 2009-07-08 ENCOUNTER — Ambulatory Visit: Payer: Self-pay | Admitting: Internal Medicine

## 2009-07-08 IMAGING — CR DG KNEE COMPLETE 4+V*R*
1 series · 5 of 5 positions shown · non-contrast
Comparison: none

REASON FOR EXAM: rt knee pain
COMMENTS:

[Series 1: view not recorded · 0.17mm/px · 5 of 5 slices shown]
[im 1/5]
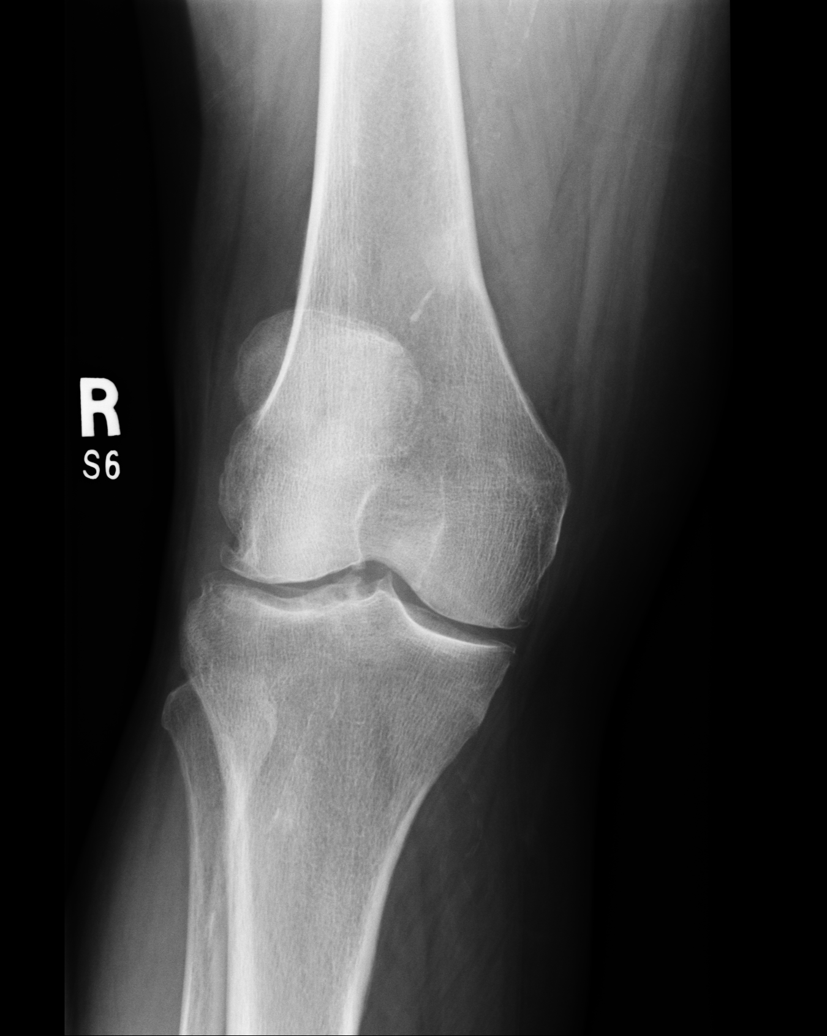
[im 2/5]
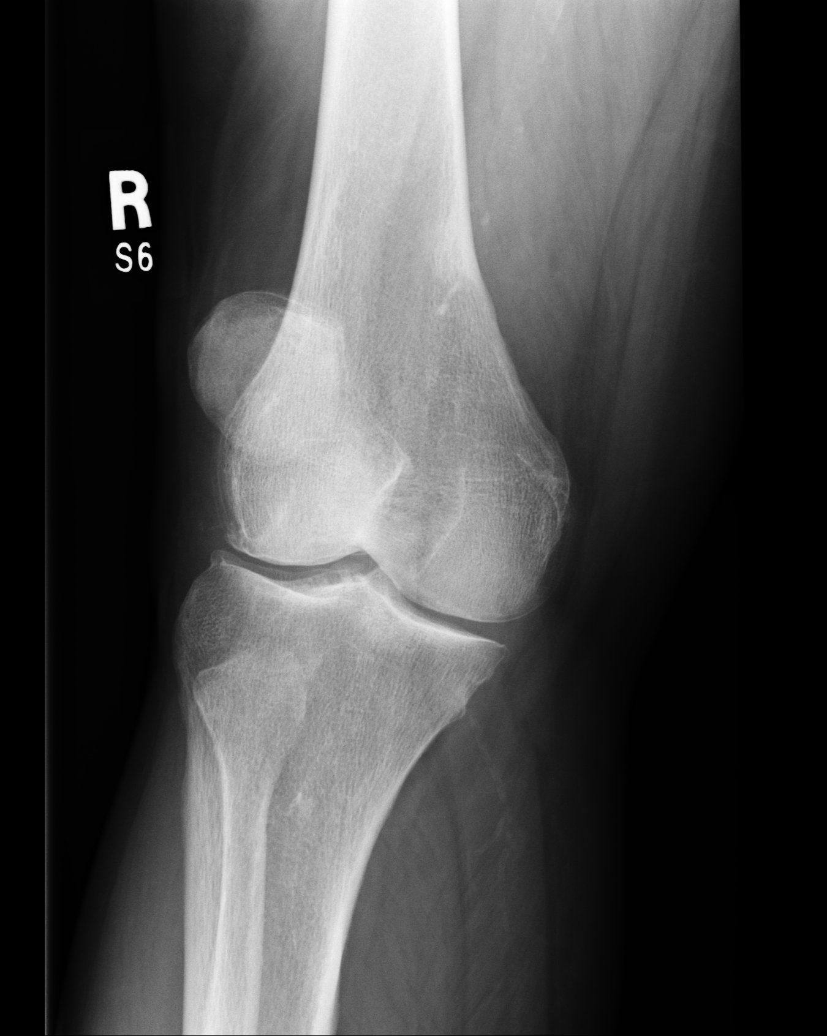
[im 3/5]
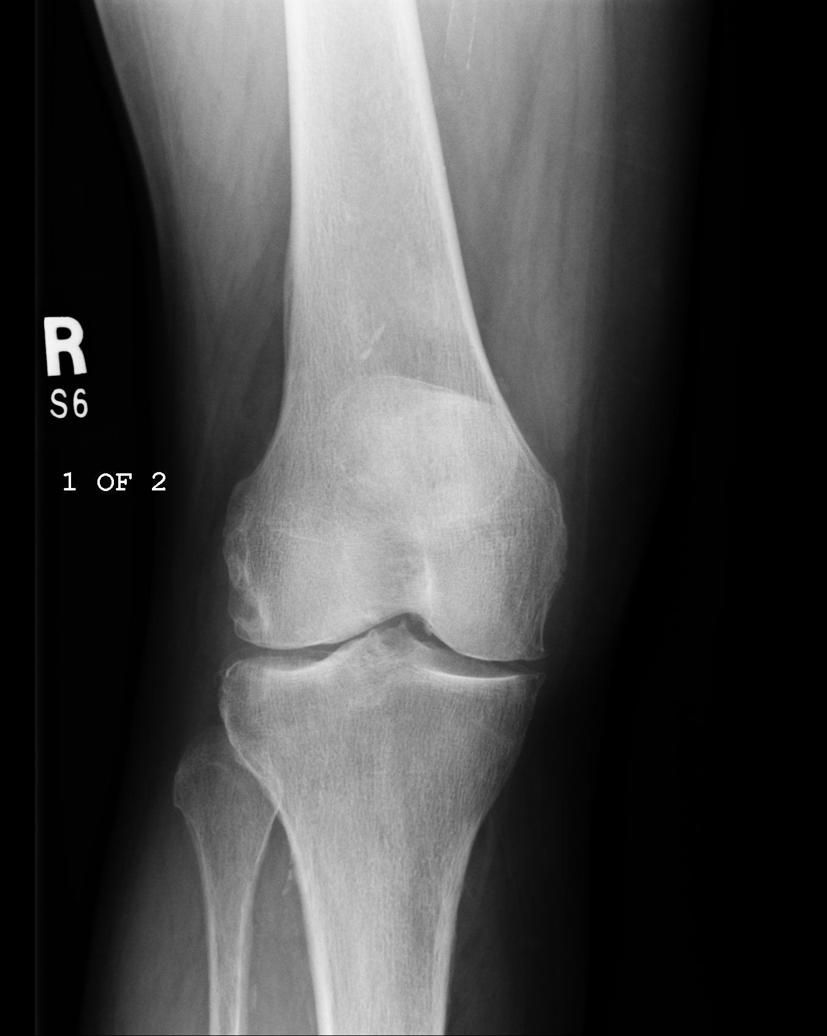
[im 4/5]
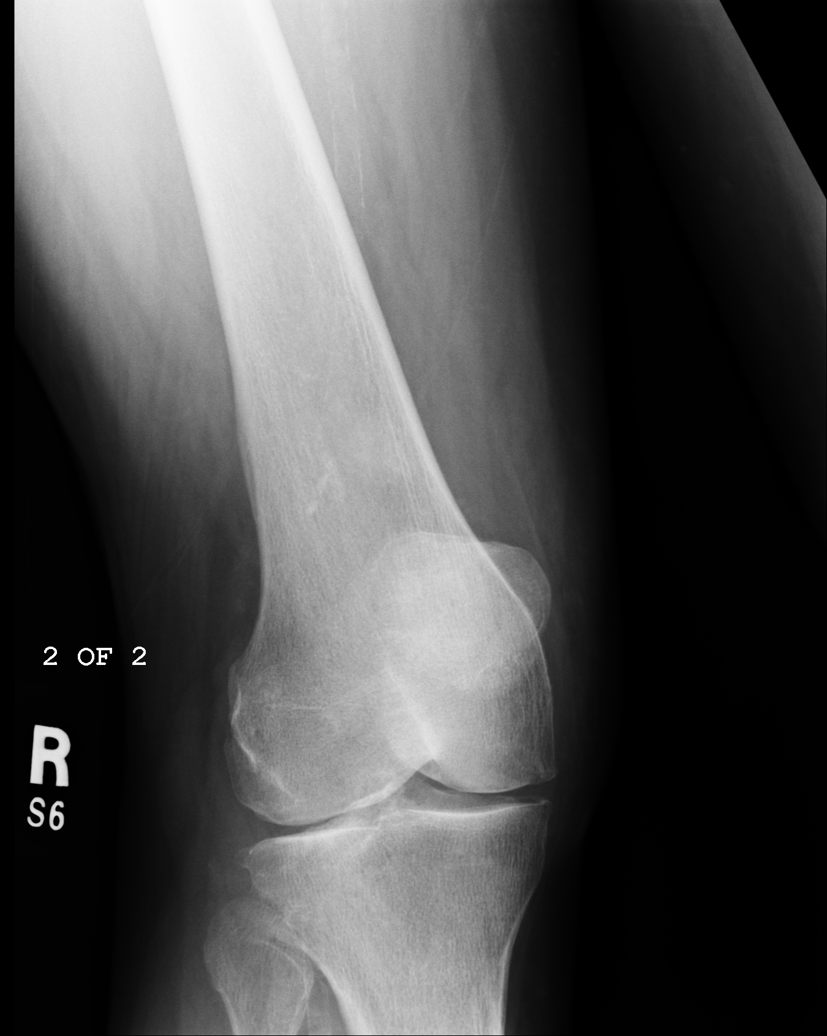
[im 5/5]
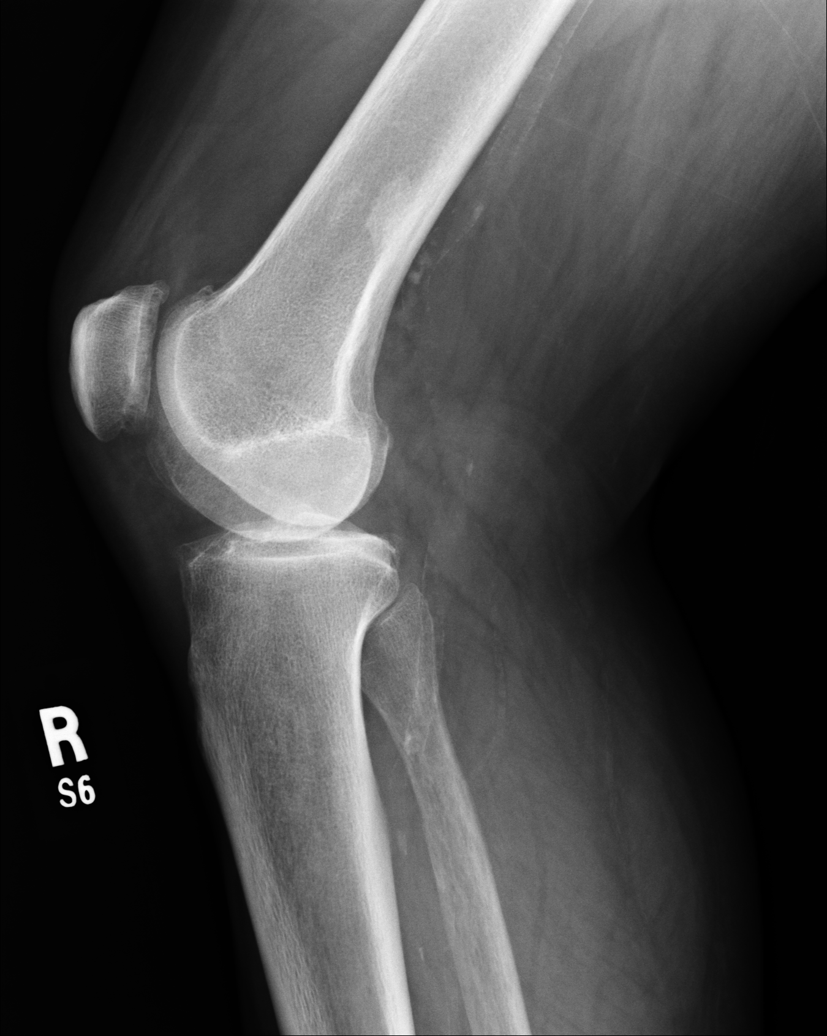

[5 of 5 positions shown; findings below may reference images not displayed]

PROCEDURE:     DXR - DXR KNEE RT COMP WITH OBLIQUES  - [DATE] [DATE]

RESULT:     Four views of the right knee reveal the bones to be adequately
mineralized. There is beaking of the tibial spines. There is mild narrowing
of both medial and lateral joint compartments. There is a small spur from
the superior margin of the patella. There are vascular calcifications in the
popliteal artery.
IMPRESSION: There is moderate degenerative change involving all 3 joint
compartments of the right knee. I do not see evidence of an acute fracture.

## 2009-07-08 IMAGING — CR CERVICAL SPINE - COMPLETE 4+ VIEW
1 series · 6 of 6 positions shown · non-contrast
Comparison: none

REASON FOR EXAM: neck pain
COMMENTS:

[Series 1: view not recorded · 0.17mm/px · 6 of 6 slices shown]
[im 1/6]
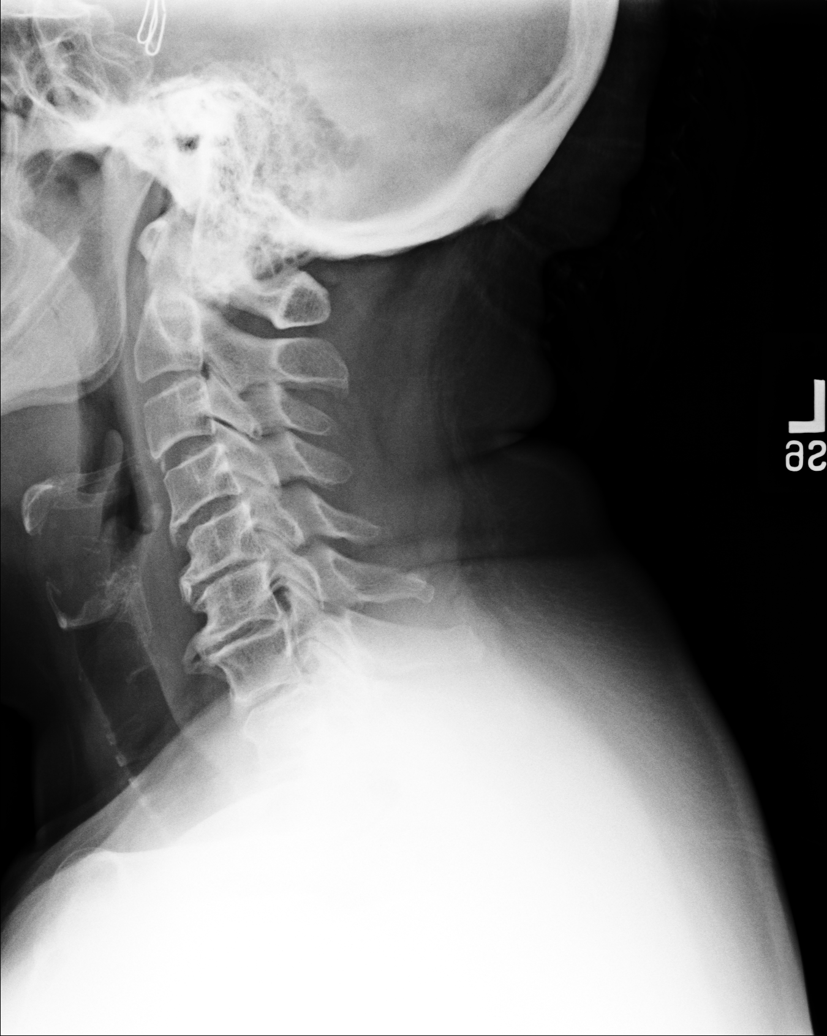
[im 2/6]
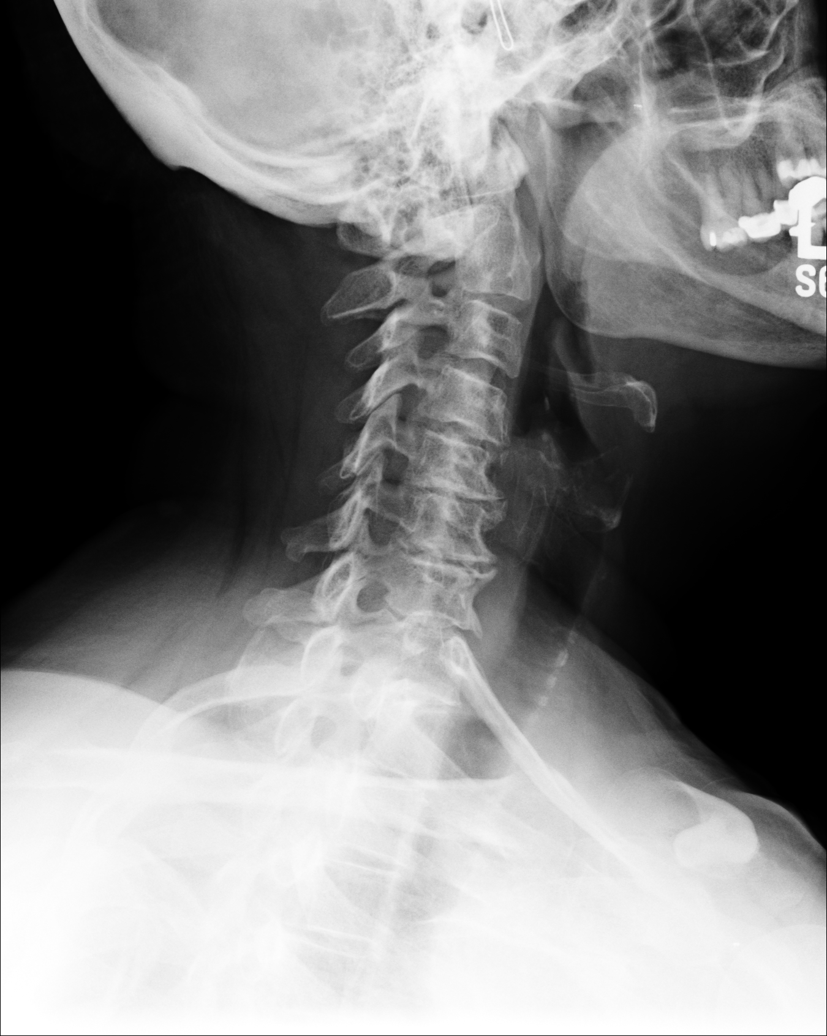
[im 3/6]
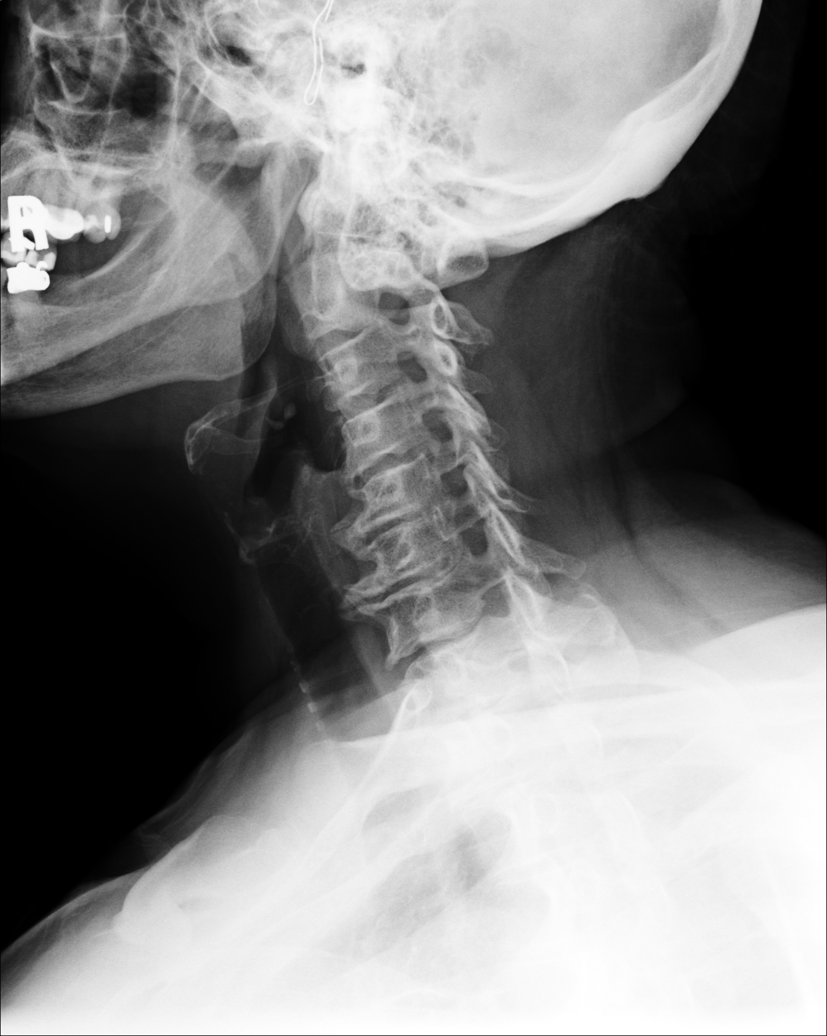
[im 4/6]
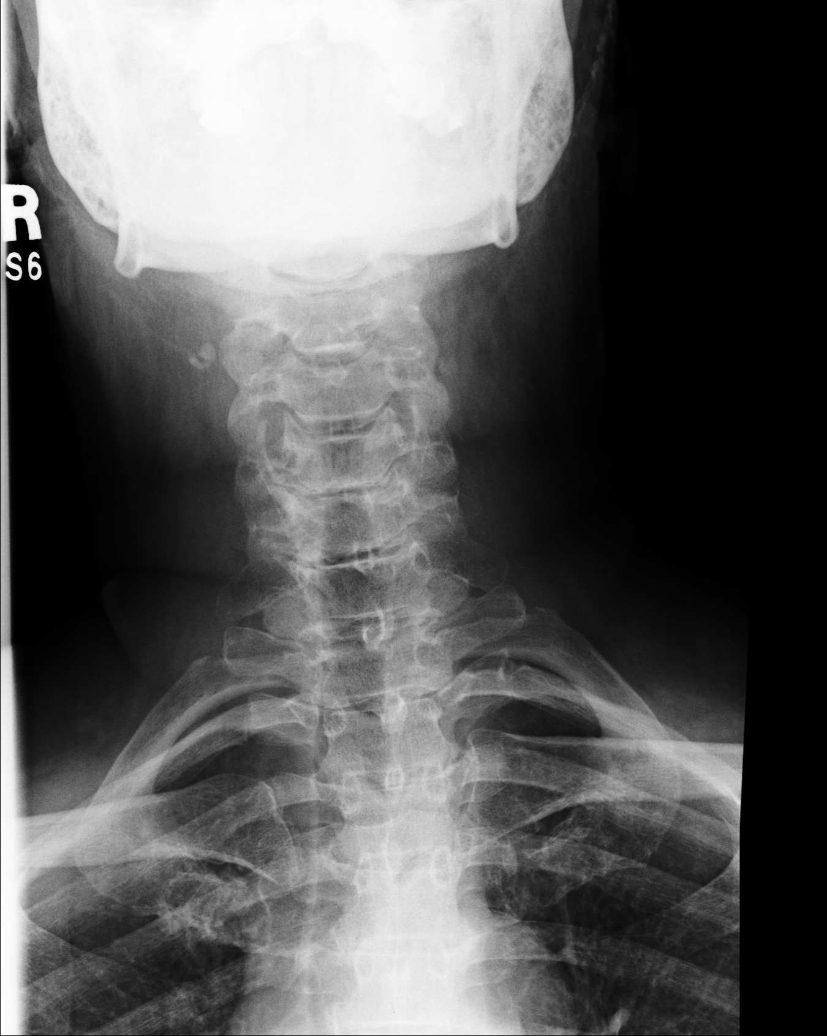
[im 5/6]
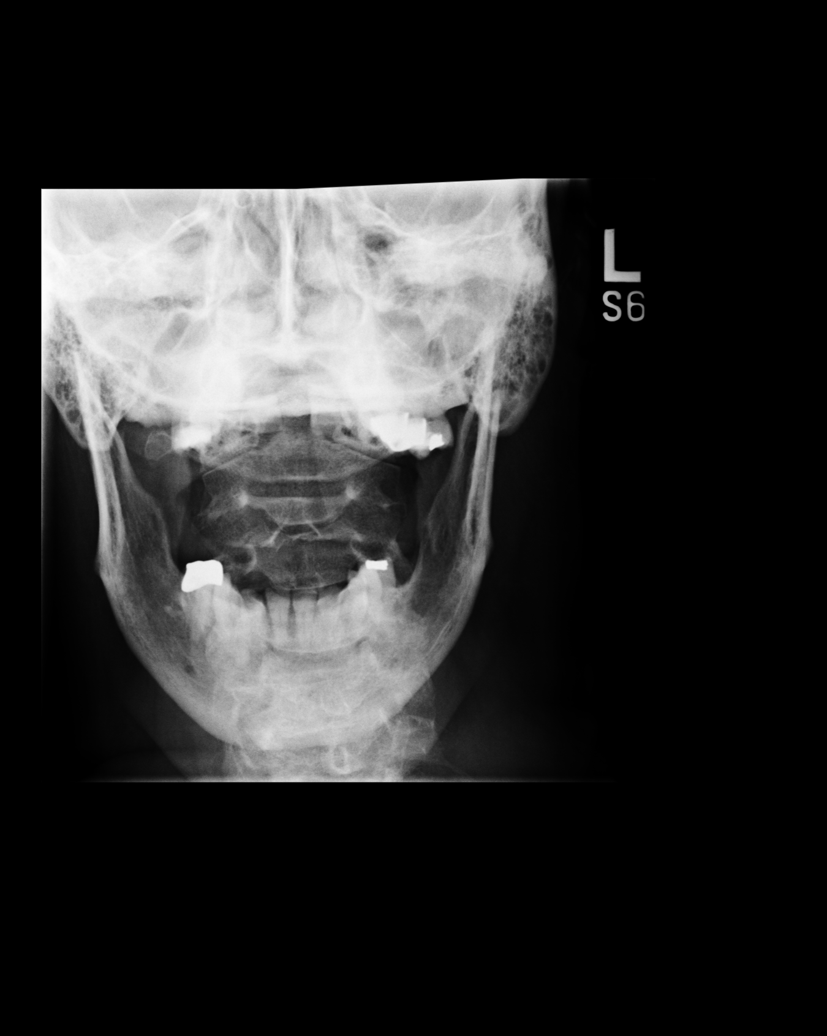
[im 6/6]
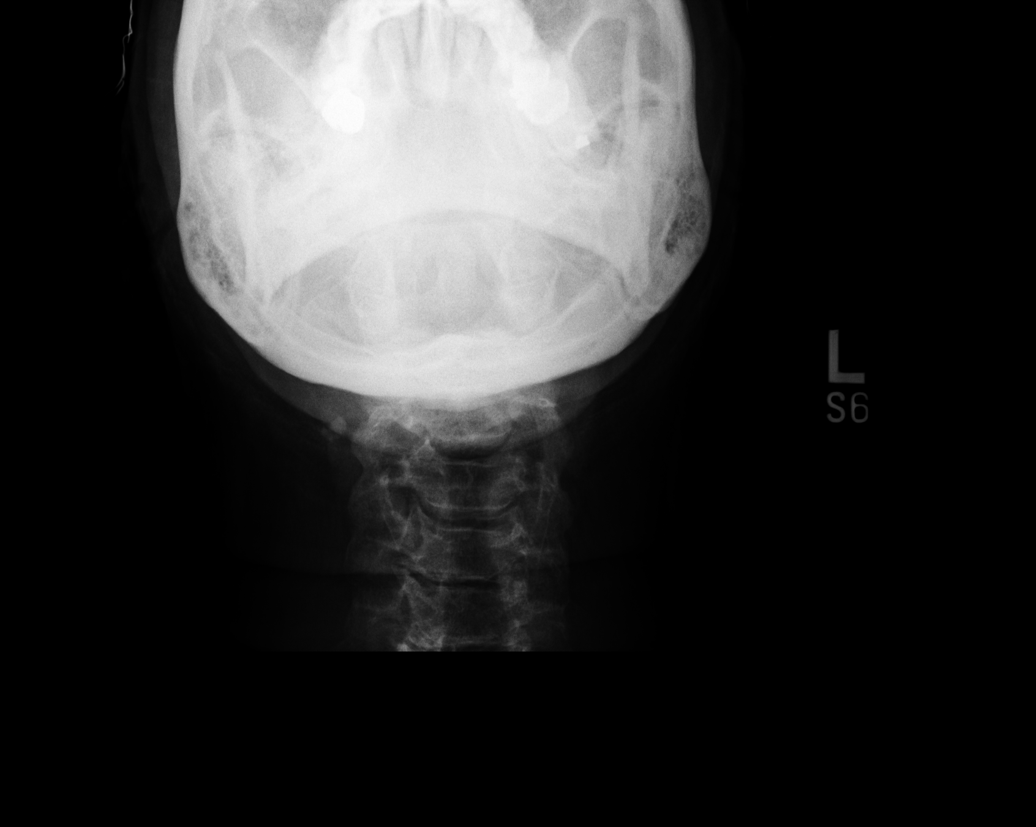

[6 of 6 positions shown; findings below may reference images not displayed]

PROCEDURE:     DXR - DXR CERVICAL SPINE COMPLETE  - [DATE] [DATE]

RESULT:     The cervical vertebral bodies are preserved in height the there
is disc space narrowing at C5-C6 and at C6-C7 and C7 T1. Anterior endplate
osteophytes are present at several levels. The posterior elements appear
intact. The prevertebral soft tissue spaces are normal. The oblique views
reveal mild encroachment upon the neural foramina at several levels. The
odontoid is intact. The lateral masses of C1 align normally with those of
C2. There is calcific density present in the region of the carotid bulbs
bilaterally.
IMPRESSION: 1. There are mild degenerative disc and facet joint changes of the mid and
lower cervical spine. There is no evidence of a fracture. Further evaluation
with MRI may be useful given that there are radicular symptoms.
2. Calcification in the region of the carotid bulbs made reflect underlying
atherosclerosis.

## 2010-07-13 ENCOUNTER — Ambulatory Visit: Payer: Self-pay | Admitting: Internal Medicine

## 2010-11-13 ENCOUNTER — Ambulatory Visit: Payer: Self-pay | Admitting: Internal Medicine

## 2010-11-13 IMAGING — US ABDOMEN ULTRASOUND LIMITED
1 series · 17 of 25 positions shown · non-contrast
Comparison: none

REASON FOR EXAM: abd pain hx of gallstones
COMMENTS:

[Series 1: abdomen ultrasound limited · 17 of 88 slices shown]
[im 1/88]
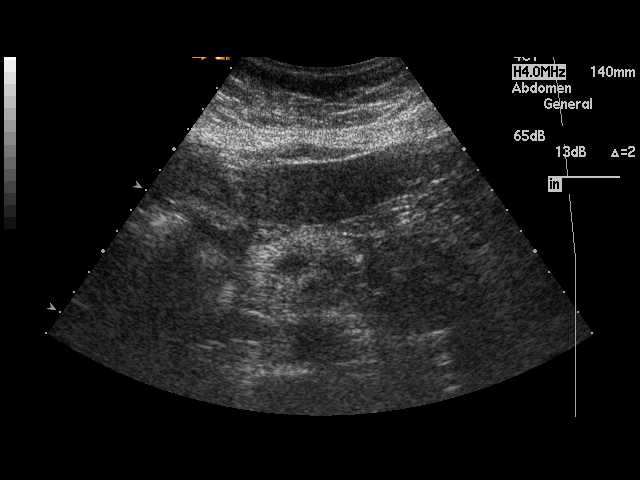
[im 8/88]
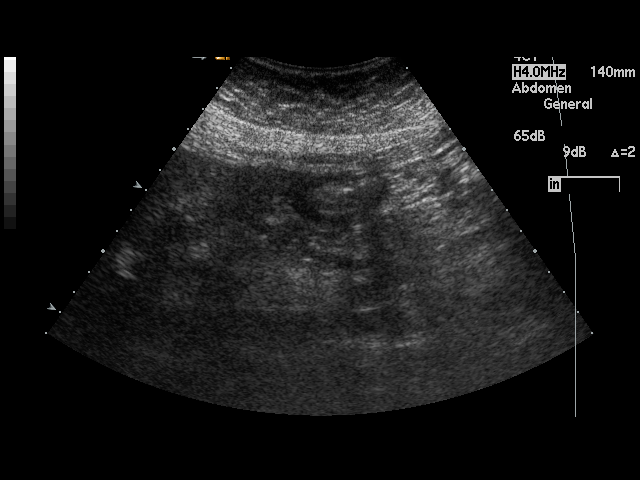
[im 11/88]
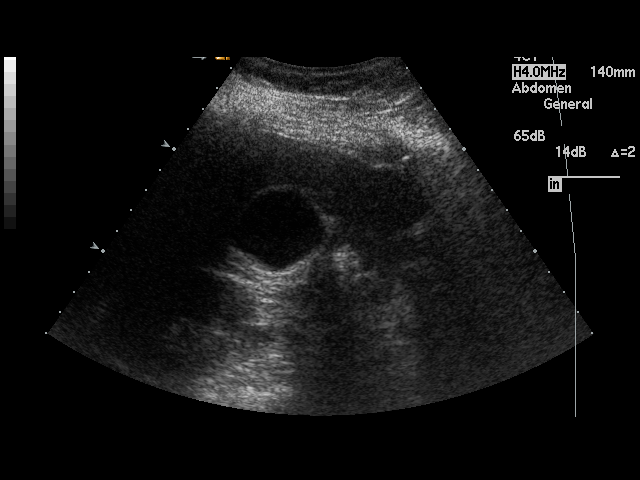
[im 19/88]
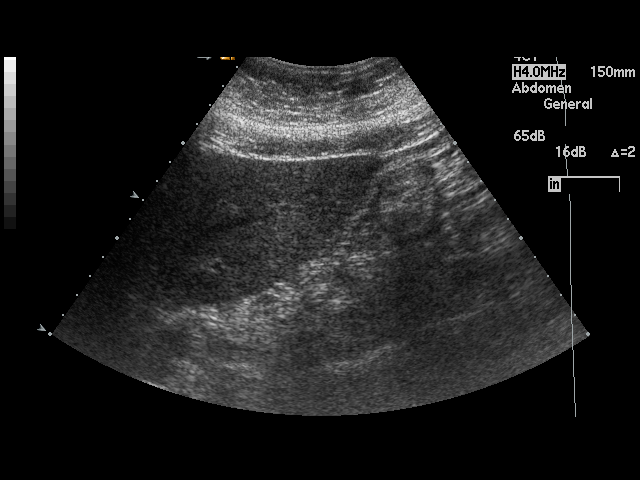
[im 22/88]
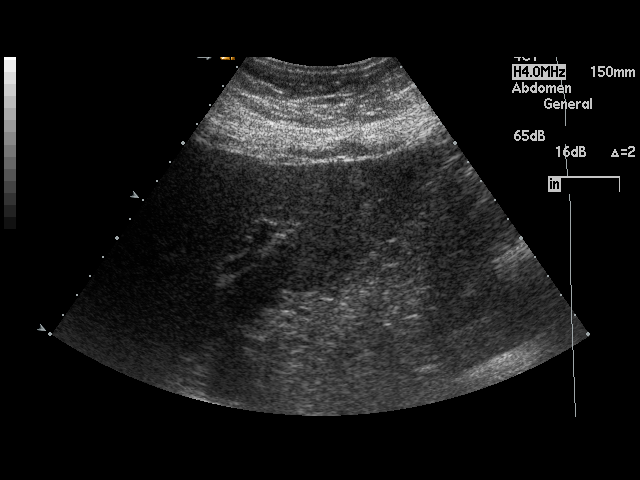
[im 30/88]
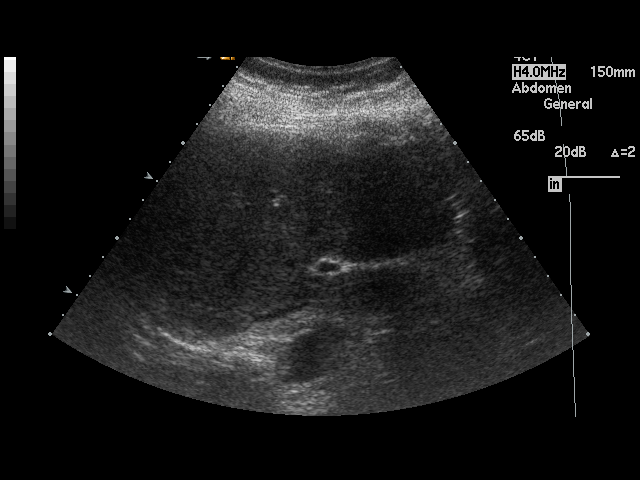
[im 33/88]
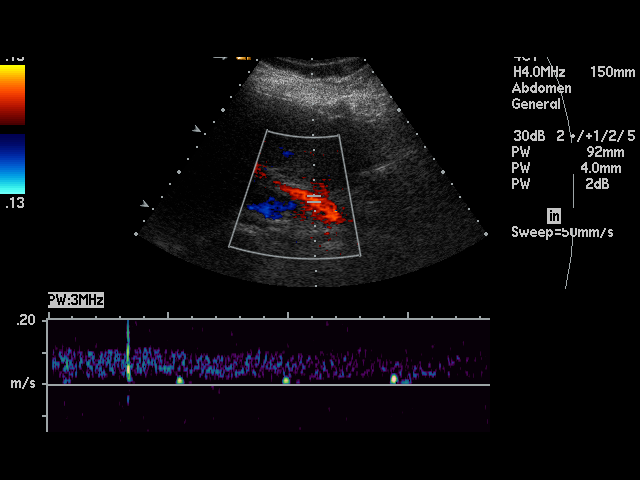
[im 40/88]
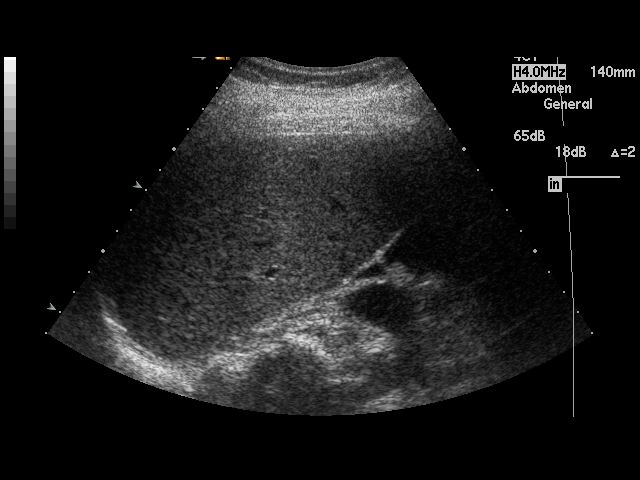
[im 44/88]
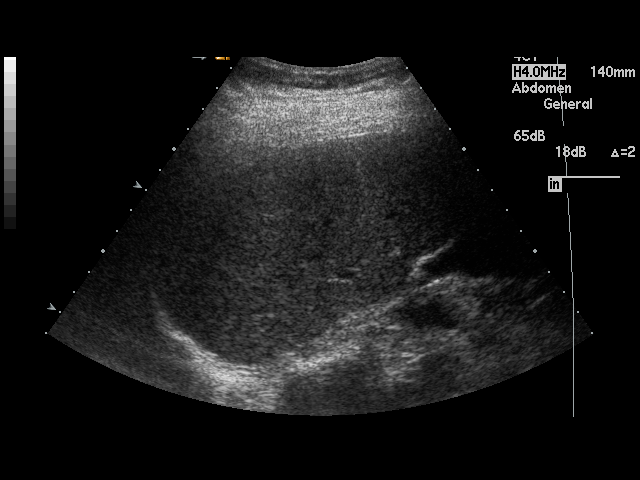
[im 48/88]
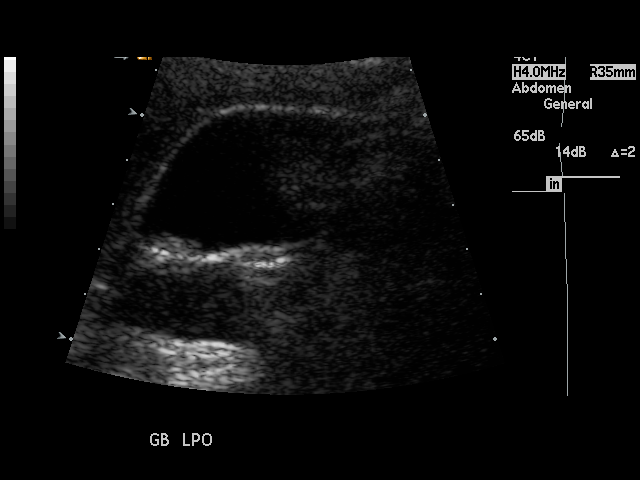
[im 55/88]
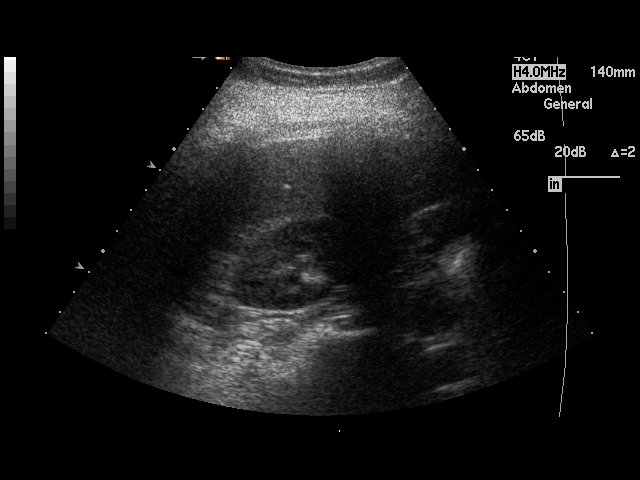
[im 59/88]
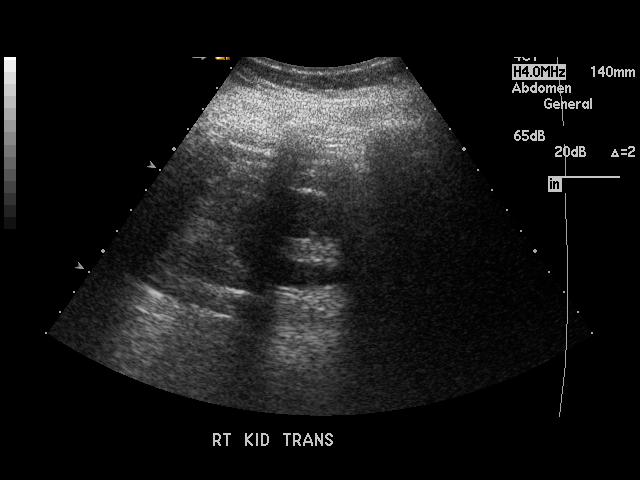
[im 66/88]
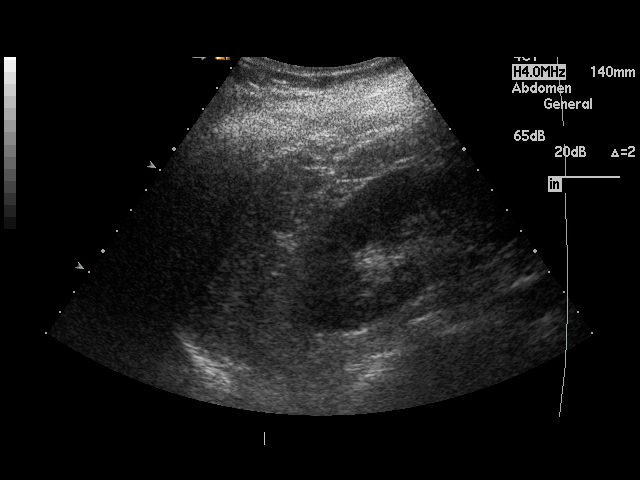
[im 69/88]
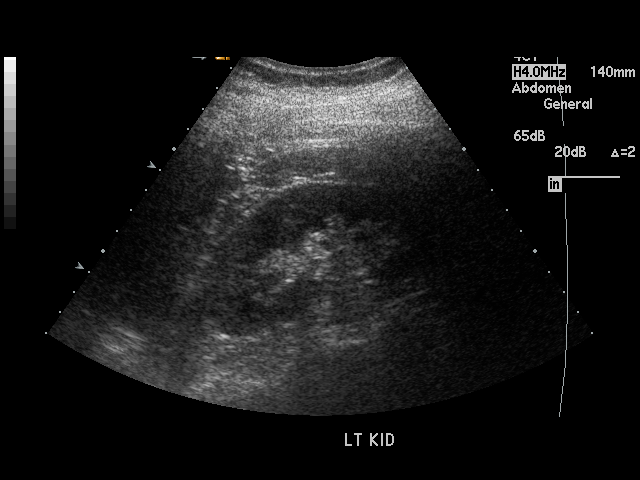
[im 77/88]
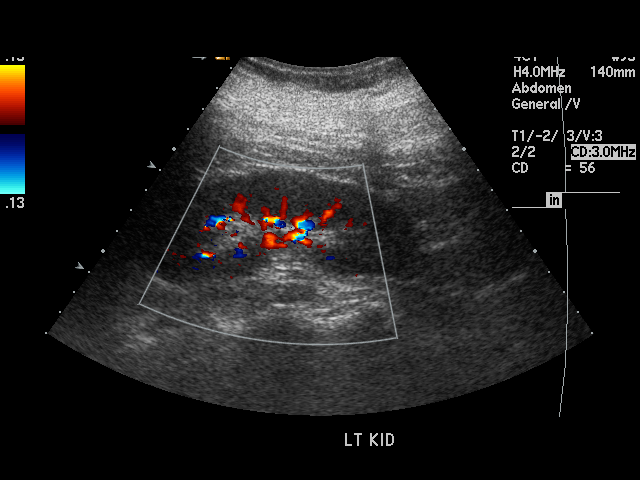
[im 80/88]
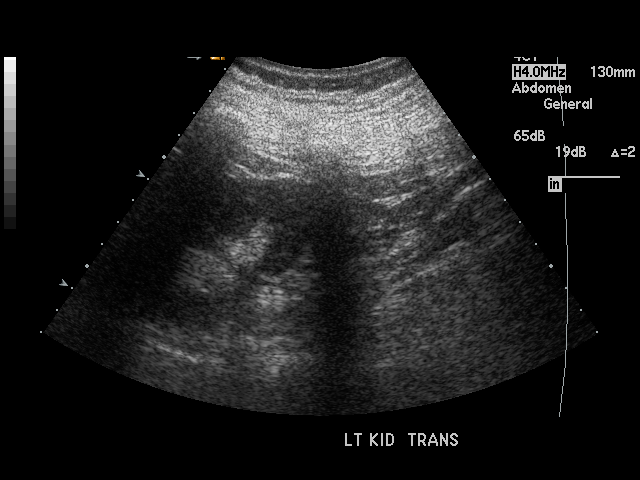
[im 88/88]
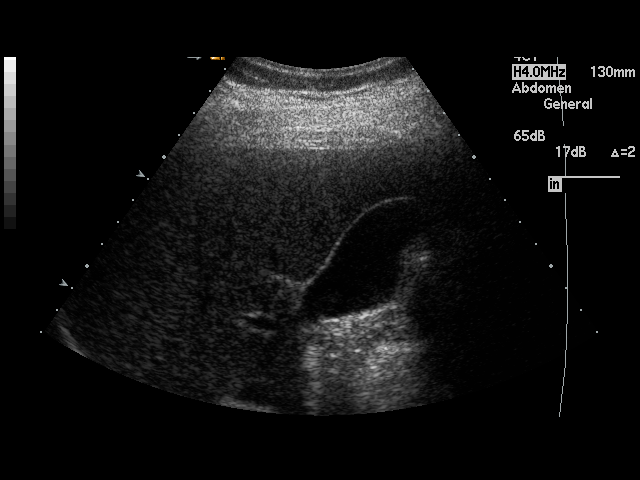

[17 of 25 positions shown; findings below may reference images not displayed]

PROCEDURE:     CHILLON - CHILLON ABDOMEN UPPER GENERAL  - [DATE]  [DATE]

RESULT:     The liver, spleen, pancreas, abdominal aorta and inferior vena
cava show no significant abnormalities. No definite gallstones are
demonstrated on the current exam. The tiny faintly shadowing echo densities
in the gallbladder consider to represent gallstones on the prior exam of
[DATE] are not definitely seen on the current exam. There is a small
amount of nonshadowing echogenic material observed consistent with sludge.
There is no thickening of the gallbladder wall. Common bile duct measures
3.2 mm in diameter which is within normal limits. The kidneys show no
hydronephrosis. Sagittally, the right kidney measures 9.77 cm and left
kidney measures 10.19 cm. There is no ascites.
IMPRESSION: 1. There is a small amount of sludge observed in the gallbladder.
2. The tiny echo densities in the gallbladder thought to represent
gallstones noted on the prior exam of [DATE] are not definitely
identified on the current study.

## 2010-11-26 ENCOUNTER — Ambulatory Visit: Payer: Self-pay | Admitting: Emergency Medicine

## 2011-09-01 ENCOUNTER — Ambulatory Visit: Payer: Self-pay | Admitting: Internal Medicine

## 2012-01-26 DIAGNOSIS — L089 Local infection of the skin and subcutaneous tissue, unspecified: Secondary | ICD-10-CM | POA: Diagnosis not present

## 2012-01-26 DIAGNOSIS — L680 Hirsutism: Secondary | ICD-10-CM | POA: Diagnosis not present

## 2012-01-26 DIAGNOSIS — L83 Acanthosis nigricans: Secondary | ICD-10-CM | POA: Diagnosis not present

## 2012-01-26 DIAGNOSIS — L679 Hair color and hair shaft abnormality, unspecified: Secondary | ICD-10-CM | POA: Diagnosis not present

## 2012-02-07 DIAGNOSIS — E11319 Type 2 diabetes mellitus with unspecified diabetic retinopathy without macular edema: Secondary | ICD-10-CM | POA: Diagnosis not present

## 2012-02-07 DIAGNOSIS — E1139 Type 2 diabetes mellitus with other diabetic ophthalmic complication: Secondary | ICD-10-CM | POA: Diagnosis not present

## 2012-02-14 DIAGNOSIS — K811 Chronic cholecystitis: Secondary | ICD-10-CM | POA: Diagnosis not present

## 2012-02-14 DIAGNOSIS — R109 Unspecified abdominal pain: Secondary | ICD-10-CM | POA: Diagnosis not present

## 2012-04-24 DIAGNOSIS — H43819 Vitreous degeneration, unspecified eye: Secondary | ICD-10-CM | POA: Diagnosis not present

## 2012-06-05 DIAGNOSIS — H43819 Vitreous degeneration, unspecified eye: Secondary | ICD-10-CM | POA: Diagnosis not present

## 2012-07-21 DIAGNOSIS — Z23 Encounter for immunization: Secondary | ICD-10-CM | POA: Diagnosis not present

## 2012-09-05 ENCOUNTER — Ambulatory Visit: Payer: Self-pay | Admitting: Internal Medicine

## 2012-09-05 DIAGNOSIS — Z1231 Encounter for screening mammogram for malignant neoplasm of breast: Secondary | ICD-10-CM | POA: Diagnosis not present

## 2012-09-05 IMAGING — MG MM CAD SCREENING MAMMO
1 series · 4 of 4 positions shown · non-contrast
Comparison: none

REASON FOR EXAM: SCR MAMMO NO ORDER
COMMENTS:

PROCEDURE:     MAM - MAM DGTL SCRN MAM NO ORDER W/CAD  - [DATE]  [DATE]
RESULT:     Scattered fibroglandular parenchymal pattern. Benign
calcifications. No mass. Exam stable from prior exams.

[R CC · right · 4 of 4 slices shown]
[im 1/4]
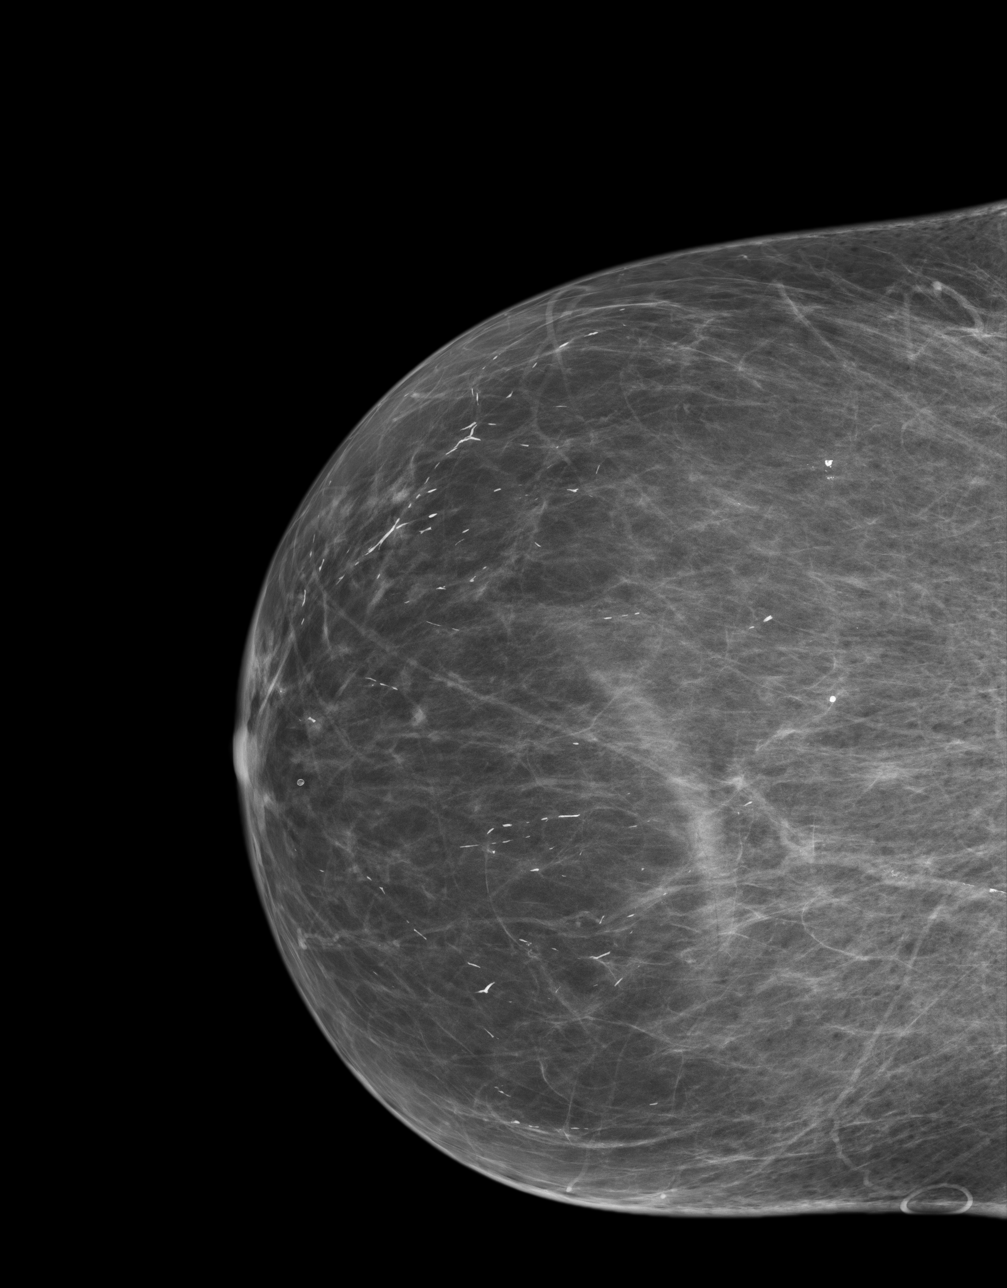
[im 2/4]
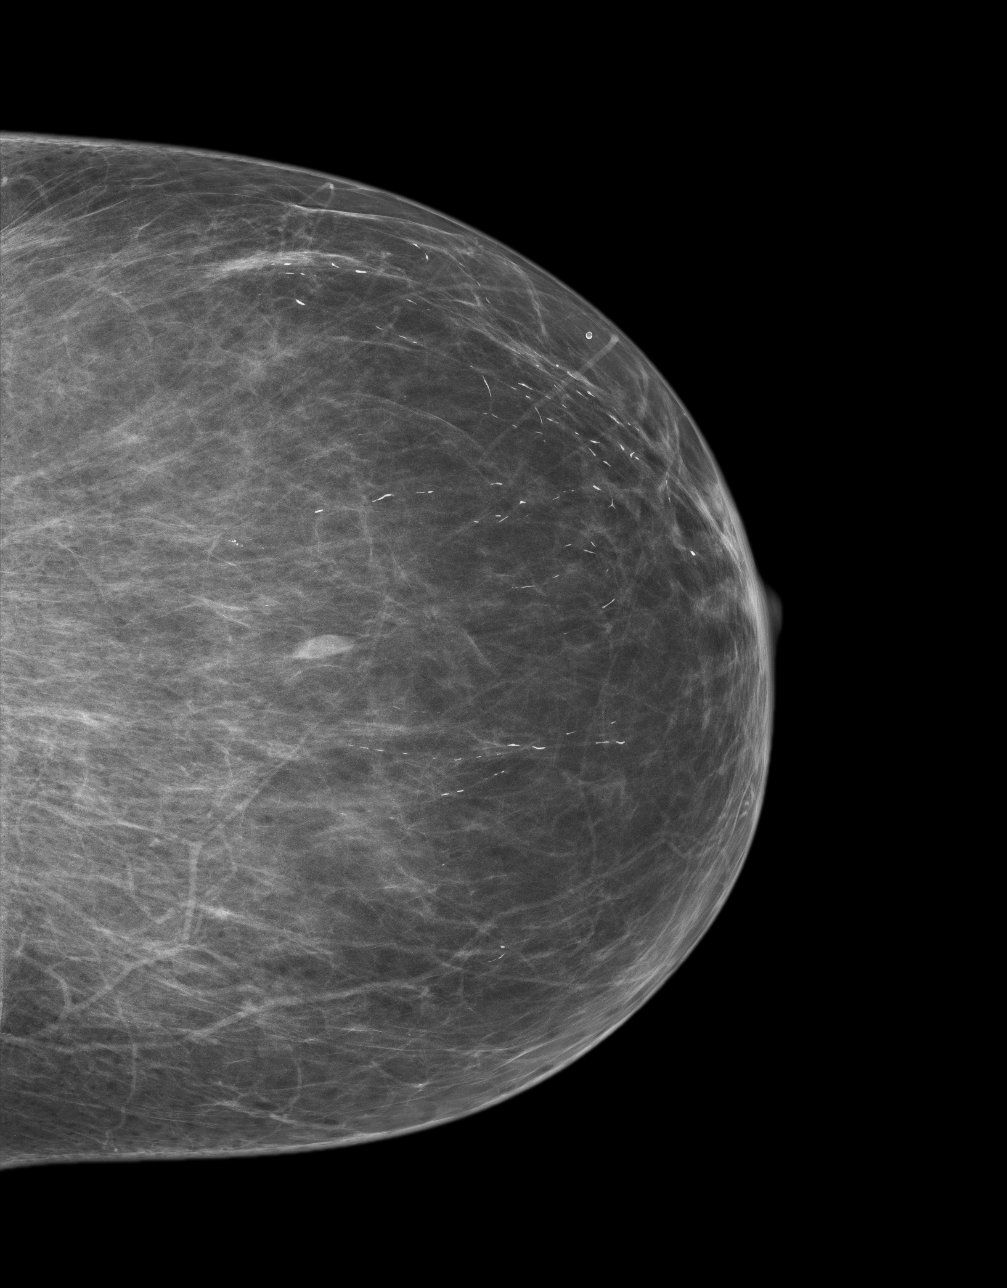
[im 3/4]
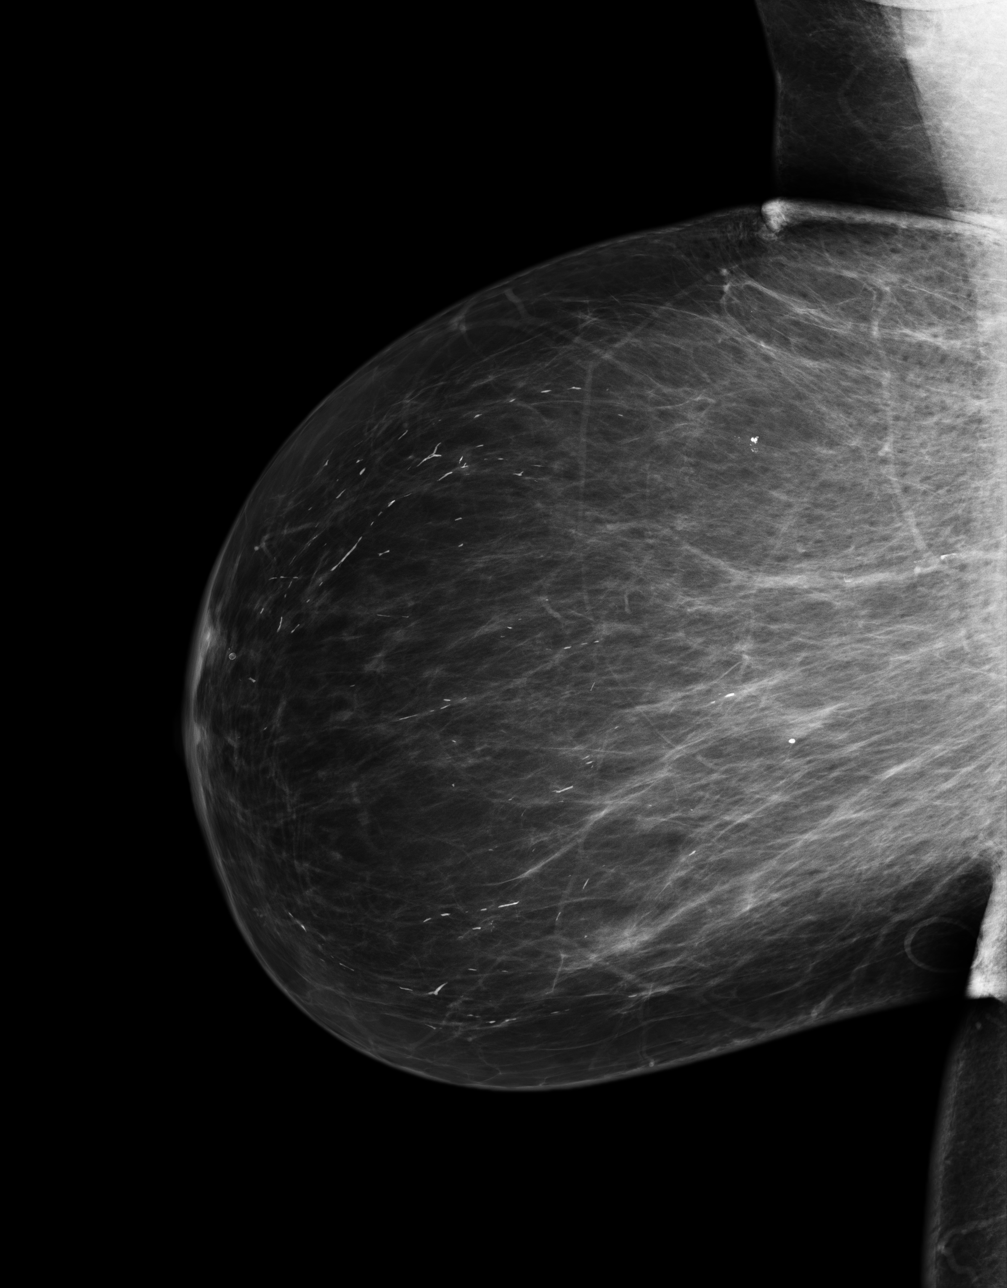
[im 4/4]
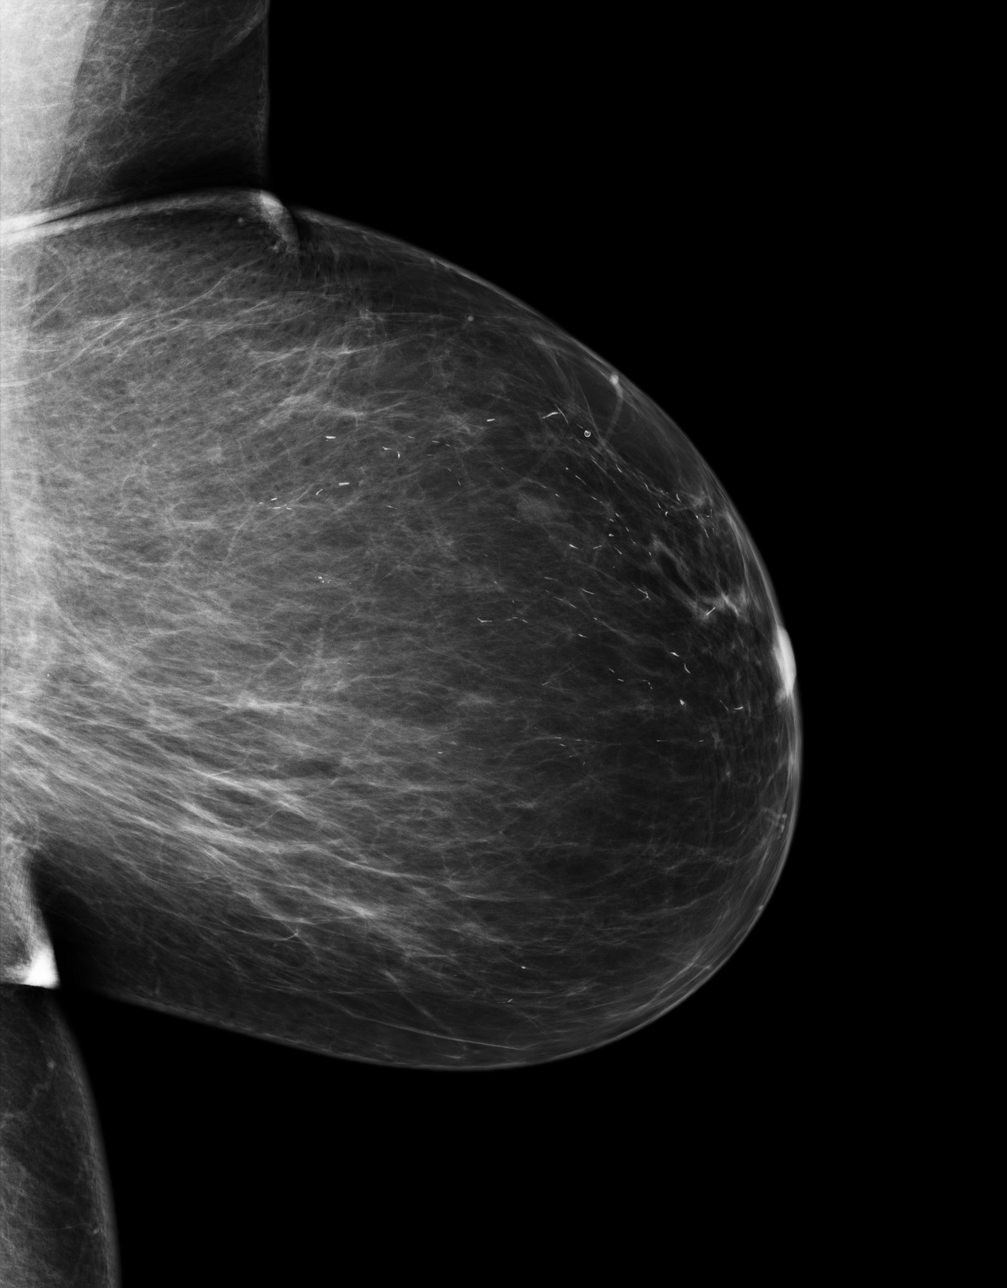

[4 of 4 positions shown; findings below may reference images not displayed]

IMPRESSION: Stable benign exam. BI-RADS: Category 2- Benign Finding

A NEGATIVE MAMMOGRAM REPORT DOES NOT PRECLUDE BIOPSY OR OTHER EVALUATION OF
A CLINICALLY PALPABLE OR OTHERWISE SUSPICIOUS MASS OR LESION. BREAST CANCER
MAY NOT BE DETECTED IN UP TO 10% OF CASES.

## 2012-10-09 DIAGNOSIS — E119 Type 2 diabetes mellitus without complications: Secondary | ICD-10-CM | POA: Diagnosis not present

## 2012-10-09 DIAGNOSIS — E785 Hyperlipidemia, unspecified: Secondary | ICD-10-CM | POA: Diagnosis not present

## 2012-10-09 DIAGNOSIS — N390 Urinary tract infection, site not specified: Secondary | ICD-10-CM | POA: Diagnosis not present

## 2012-10-09 DIAGNOSIS — T887XXA Unspecified adverse effect of drug or medicament, initial encounter: Secondary | ICD-10-CM | POA: Diagnosis not present

## 2012-10-09 DIAGNOSIS — E109 Type 1 diabetes mellitus without complications: Secondary | ICD-10-CM | POA: Diagnosis not present

## 2012-10-09 DIAGNOSIS — E7800 Pure hypercholesterolemia, unspecified: Secondary | ICD-10-CM | POA: Diagnosis not present

## 2012-11-07 DIAGNOSIS — I129 Hypertensive chronic kidney disease with stage 1 through stage 4 chronic kidney disease, or unspecified chronic kidney disease: Secondary | ICD-10-CM | POA: Diagnosis not present

## 2012-11-07 DIAGNOSIS — E1129 Type 2 diabetes mellitus with other diabetic kidney complication: Secondary | ICD-10-CM | POA: Diagnosis not present

## 2012-11-07 DIAGNOSIS — E785 Hyperlipidemia, unspecified: Secondary | ICD-10-CM | POA: Diagnosis not present

## 2012-12-08 DIAGNOSIS — E1139 Type 2 diabetes mellitus with other diabetic ophthalmic complication: Secondary | ICD-10-CM | POA: Diagnosis not present

## 2012-12-08 DIAGNOSIS — E11319 Type 2 diabetes mellitus with unspecified diabetic retinopathy without macular edema: Secondary | ICD-10-CM | POA: Diagnosis not present

## 2013-06-08 DIAGNOSIS — E1139 Type 2 diabetes mellitus with other diabetic ophthalmic complication: Secondary | ICD-10-CM | POA: Diagnosis not present

## 2013-06-08 DIAGNOSIS — E11319 Type 2 diabetes mellitus with unspecified diabetic retinopathy without macular edema: Secondary | ICD-10-CM | POA: Diagnosis not present

## 2013-07-18 DIAGNOSIS — E1129 Type 2 diabetes mellitus with other diabetic kidney complication: Secondary | ICD-10-CM | POA: Diagnosis not present

## 2013-07-18 DIAGNOSIS — I129 Hypertensive chronic kidney disease with stage 1 through stage 4 chronic kidney disease, or unspecified chronic kidney disease: Secondary | ICD-10-CM | POA: Diagnosis not present

## 2013-07-18 DIAGNOSIS — E785 Hyperlipidemia, unspecified: Secondary | ICD-10-CM | POA: Diagnosis not present

## 2013-07-18 DIAGNOSIS — N181 Chronic kidney disease, stage 1: Secondary | ICD-10-CM | POA: Diagnosis not present

## 2013-07-18 DIAGNOSIS — Z23 Encounter for immunization: Secondary | ICD-10-CM | POA: Diagnosis not present

## 2013-08-13 DIAGNOSIS — I10 Essential (primary) hypertension: Secondary | ICD-10-CM | POA: Diagnosis not present

## 2013-08-13 DIAGNOSIS — E119 Type 2 diabetes mellitus without complications: Secondary | ICD-10-CM | POA: Diagnosis not present

## 2013-08-29 DIAGNOSIS — E1129 Type 2 diabetes mellitus with other diabetic kidney complication: Secondary | ICD-10-CM | POA: Diagnosis not present

## 2013-08-29 DIAGNOSIS — Z0000 Encounter for general adult medical examination without abnormal findings: Secondary | ICD-10-CM | POA: Diagnosis not present

## 2013-08-29 DIAGNOSIS — N189 Chronic kidney disease, unspecified: Secondary | ICD-10-CM | POA: Diagnosis not present

## 2013-08-29 DIAGNOSIS — I129 Hypertensive chronic kidney disease with stage 1 through stage 4 chronic kidney disease, or unspecified chronic kidney disease: Secondary | ICD-10-CM | POA: Diagnosis not present

## 2013-09-06 DIAGNOSIS — B351 Tinea unguium: Secondary | ICD-10-CM | POA: Diagnosis not present

## 2013-09-06 DIAGNOSIS — M79609 Pain in unspecified limb: Secondary | ICD-10-CM | POA: Diagnosis not present

## 2013-09-10 ENCOUNTER — Ambulatory Visit: Payer: Self-pay | Admitting: Family

## 2013-09-10 DIAGNOSIS — Z1231 Encounter for screening mammogram for malignant neoplasm of breast: Secondary | ICD-10-CM | POA: Diagnosis not present

## 2013-09-10 IMAGING — MG MM DIGITAL SCREENING BILAT W/ CAD
1 series · 4 of 4 positions shown · non-contrast
Comparison: Previous exam(s).

CLINICAL DATA: Screening.

EXAM:
DIGITAL SCREENING BILATERAL MAMMOGRAM WITH CAD

[R CC · right · 4 of 4 slices shown]
[im 1/4]
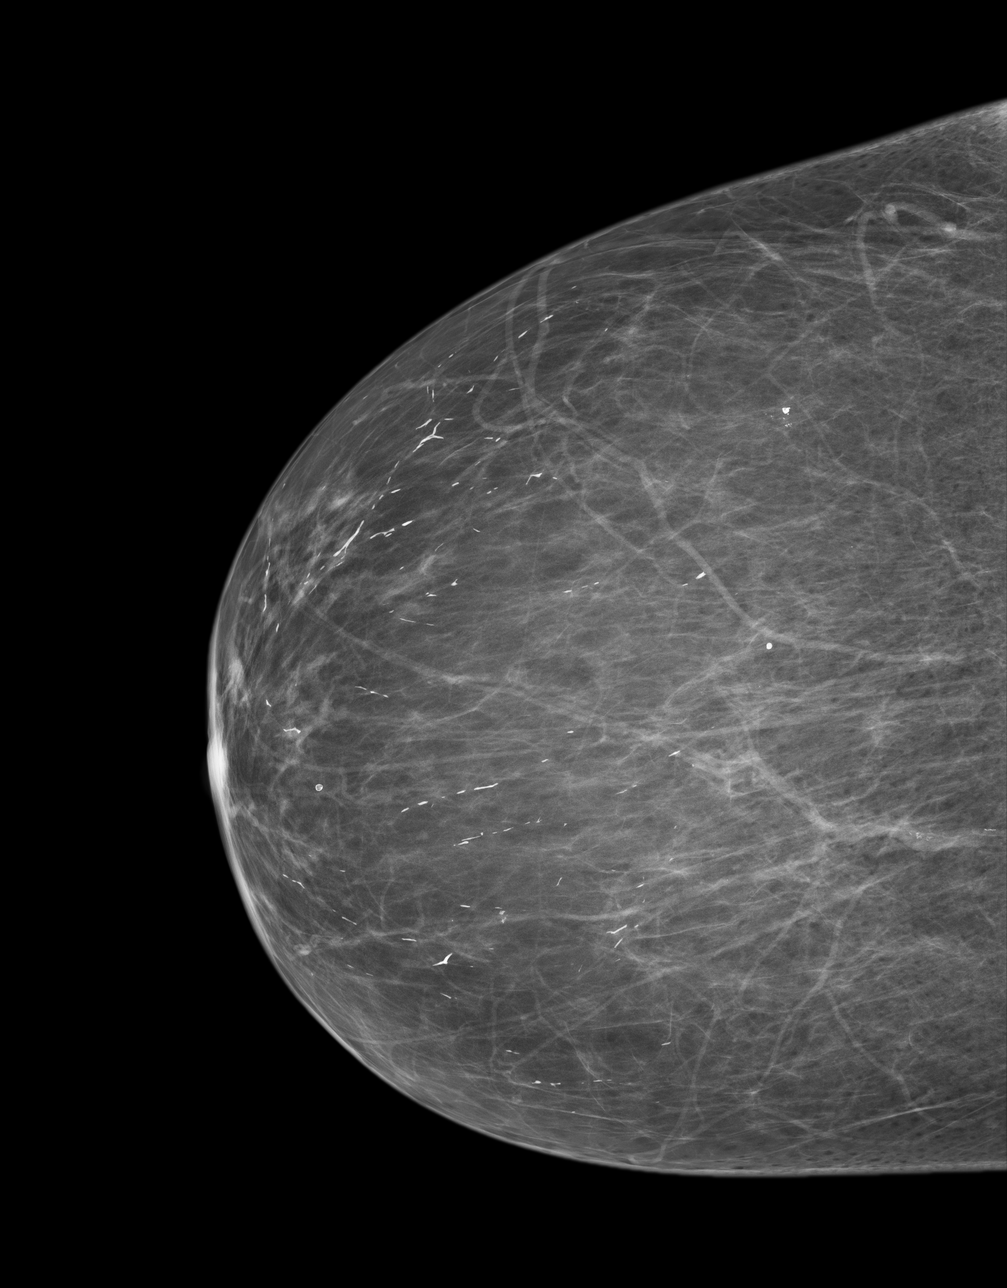
[im 2/4]
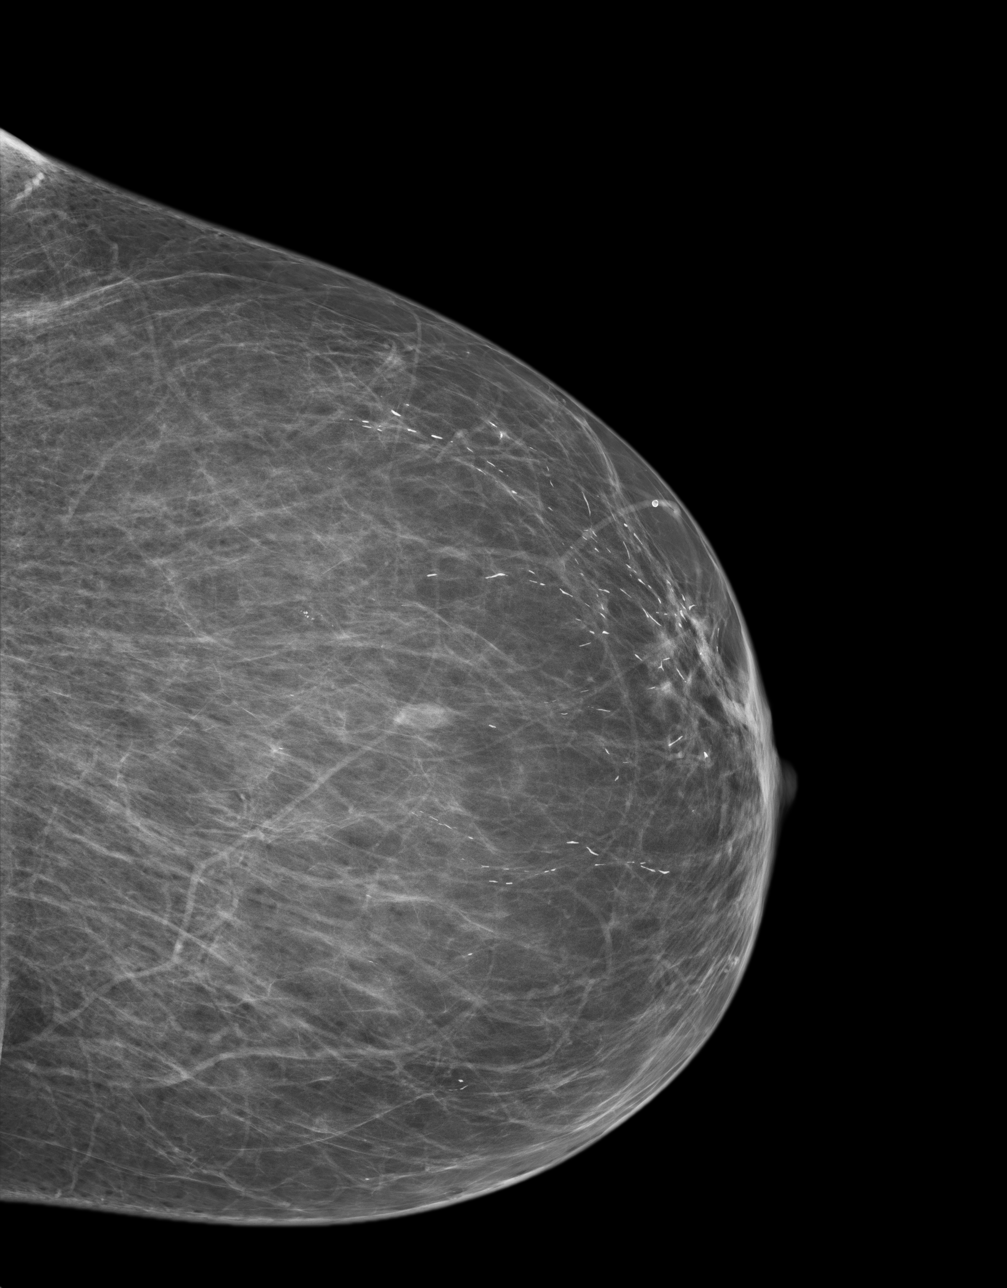
[im 3/4]
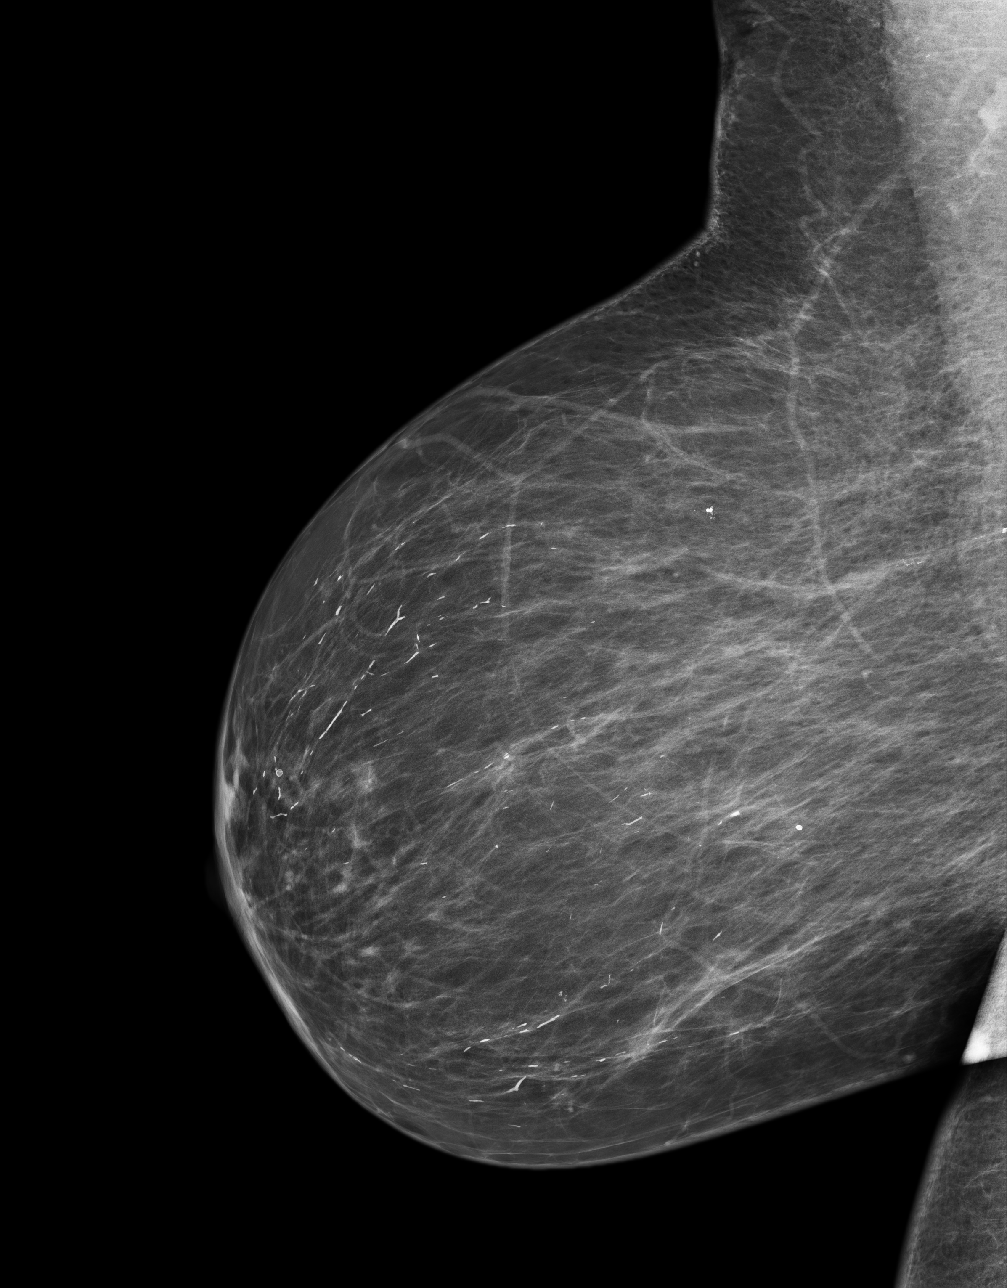
[im 4/4]
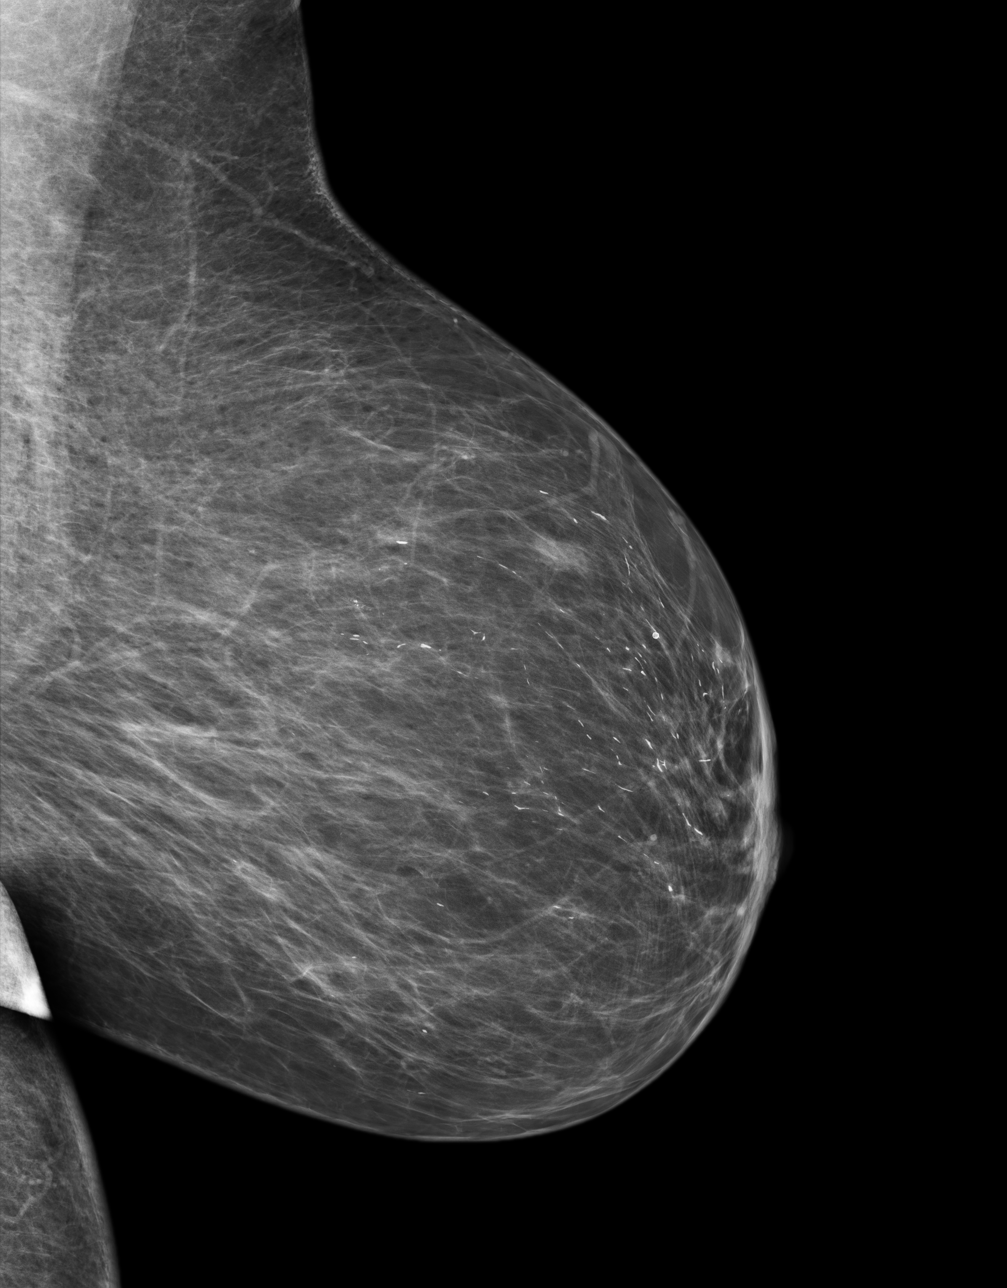

[4 of 4 positions shown; findings below may reference images not displayed]

ACR Breast Density Category b: There are scattered areas of
fibroglandular density.
FINDINGS: There are no findings suspicious for malignancy. Images were
processed with CAD.
IMPRESSION: No mammographic evidence of malignancy. A result letter of this
screening mammogram will be mailed directly to the patient.

RECOMMENDATION:
Screening mammogram in one year. (Code:[HN])

BI-RADS CATEGORY  1: Negative

## 2013-09-20 ENCOUNTER — Encounter: Payer: Self-pay | Admitting: Podiatrist

## 2013-09-20 ENCOUNTER — Ambulatory Visit (INDEPENDENT_AMBULATORY_CARE_PROVIDER_SITE_OTHER): Payer: Medicare Other | Admitting: Podiatrist

## 2013-09-20 VITALS — BP 208/101 | HR 72 | Resp 16 | Ht 68.0 in | Wt 197.0 lb

## 2013-09-20 DIAGNOSIS — M216X9 Other acquired deformities of unspecified foot: Secondary | ICD-10-CM

## 2013-09-20 DIAGNOSIS — L84 Corns and callosities: Secondary | ICD-10-CM | POA: Diagnosis not present

## 2013-09-20 DIAGNOSIS — M2040 Other hammer toe(s) (acquired), unspecified foot: Secondary | ICD-10-CM

## 2013-09-20 DIAGNOSIS — M2042 Other hammer toe(s) (acquired), left foot: Secondary | ICD-10-CM

## 2013-09-20 NOTE — Patient Instructions (Signed)
Diabetes and Foot Care Diabetes may cause you to have problems because of poor blood supply (circulation) to your feet and legs. This may cause the skin on your feet to become thinner, break easier, and heal more slowly. Your skin may become dry, and the skin may peel and crack. You may also have nerve damage in your legs and feet causing decreased feeling in them. You may not notice minor injuries to your feet that could lead to infections or more serious problems. Taking care of your feet is one of the most important things you can do for yourself.  HOME CARE INSTRUCTIONS  Wear shoes at all times, even in the house. Do not go barefoot. Bare feet are easily injured.  Check your feet daily for blisters, cuts, and redness. If you cannot see the bottom of your feet, use a mirror or ask someone for help.  Wash your feet with warm water (do not use hot water) and mild soap. Then pat your feet and the areas between your toes until they are completely dry. Do not soak your feet as this can dry your skin.  Apply a moisturizing lotion or petroleum jelly (that does not contain alcohol and is unscented) to the skin on your feet and to dry, brittle toenails. Do not apply lotion between your toes.  Trim your toenails straight across. Do not dig under them or around the cuticle. File the edges of your nails with an emery board or nail file.  Do not cut corns or calluses or try to remove them with medicine.  Wear clean socks or stockings every day. Make sure they are not too tight. Do not wear knee-high stockings since they may decrease blood flow to your legs.  Wear shoes that fit properly and have enough cushioning. To break in new shoes, wear them for just a few hours a day. This prevents you from injuring your feet. Always look in your shoes before you put them on to be sure there are no objects inside.  Do not cross your legs. This may decrease the blood flow to your feet.  If you find a minor scrape,  cut, or break in the skin on your feet, keep it and the skin around it clean and dry. These areas may be cleansed with mild soap and water. Do not cleanse the area with peroxide, alcohol, or iodine.  When you remove an adhesive bandage, be sure not to damage the skin around it.  If you have a wound, look at it several times a day to make sure it is healing.  Do not use heating pads or hot water bottles. They may burn your skin. If you have lost feeling in your feet or legs, you may not know it is happening until it is too late.  Make sure your health care provider performs a complete foot exam at least annually or more often if you have foot problems. Report any cuts, sores, or bruises to your health care provider immediately. SEEK MEDICAL CARE IF:   You have an injury that is not healing.  You have cuts or breaks in the skin.  You have an ingrown nail.  You notice redness on your legs or feet.  You feel burning or tingling in your legs or feet.  You have pain or cramps in your legs and feet.  Your legs or feet are numb.  Your feet always feel cold. SEEK IMMEDIATE MEDICAL CARE IF:   There is increasing redness,   swelling, or pain in or around a wound.  There is a red line that goes up your leg.  Pus is coming from a wound.  You develop a fever or as directed by your health care provider.  You notice a bad smell coming from an ulcer or wound. Document Released: 10/01/2000 Document Revised: 06/06/2013 Document Reviewed: 03/13/2013 [employer] Patient Information 2014 [employer].  

## 2013-09-20 NOTE — Progress Notes (Signed)
   Subjective:    Patient ID: Carrie Kane, female    DOB: [birth year], 72 y.o.   MRN: [id number]  Subjective:  Patient presents today as a new patient complaining of toe pain 4th digit of her left foot.  She states she has noticed a callus on the toe and it is uncomfortable.  She is diabetic and denies any changes in her diabetes or its control.  She denies any drainage or redness to the toe.  She lives close to [place] and has been seeing a podiatrist past [facility] for her diabetic foot care.  HPI Comments: N PAIN L 4TH DIGIT LEFT D 3M O ? C SAME A ? T PEROXIDE  Toe Pain       Review of Systems  Constitutional: Negative.   HENT: Negative.   Eyes: Positive for redness and itching.  Respiratory: Negative.   Cardiovascular: Negative.   Gastrointestinal: Negative.   Endocrine: Negative.   Genitourinary: Negative.   Musculoskeletal: Negative.   Skin:       CHANGE IN NAILS  Allergic/Immunologic: Positive for environmental allergies.  Neurological: Negative.   Hematological: Negative.   Psychiatric/Behavioral: Negative.        Objective:   Physical Exam GENERAL APPEARANCE: Alert, conversant. Appropriately groomed. No acute distress.  VASCULAR: Pedal pulses palpable at 1/4 DP and PT bilateral.  Capillary refill time is less than 4 seconds to all digits,  Proximal to distal cooling it warm to warm.   NEUROLOGIC: sensation is intact epicritically and protectively to 5.07 monofilament at 3/5 sites bilateral.  Light touch is intact bilateral, vibratory sensation intact bilateral, achilles tendon reflex is intact bilateral.  MUSCULOSKELETAL: acceptable muscle strength, tone and stability bilateral.  Hallux abductovalgus deformity bilateral present,  Forefoot cavus deformity with contracture of digits noted.   DERMATOLOGIC: left 4th toe has slight swelling and darkening of the skin compared to the remainder of the toes despite normal capillary refill time present.  A hard  corn is present on the medial side of the toe.  It is painful to the patient.  No sign of infection present.  No open lesions persent.  Hyperkeratotic lesions submet 2/3 of bilateral feet also present.  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  She will be seen back as needed or per request.    Marlowe Aschoff, DPM

## 2013-09-28 DIAGNOSIS — E1129 Type 2 diabetes mellitus with other diabetic kidney complication: Secondary | ICD-10-CM | POA: Diagnosis not present

## 2013-09-28 DIAGNOSIS — N181 Chronic kidney disease, stage 1: Secondary | ICD-10-CM | POA: Diagnosis not present

## 2013-09-28 DIAGNOSIS — I129 Hypertensive chronic kidney disease with stage 1 through stage 4 chronic kidney disease, or unspecified chronic kidney disease: Secondary | ICD-10-CM | POA: Diagnosis not present

## 2013-09-28 DIAGNOSIS — R42 Dizziness and giddiness: Secondary | ICD-10-CM | POA: Diagnosis not present

## 2013-10-29 DIAGNOSIS — I10 Essential (primary) hypertension: Secondary | ICD-10-CM | POA: Diagnosis not present

## 2013-10-29 DIAGNOSIS — K828 Other specified diseases of gallbladder: Secondary | ICD-10-CM | POA: Diagnosis not present

## 2013-10-29 DIAGNOSIS — G459 Transient cerebral ischemic attack, unspecified: Secondary | ICD-10-CM | POA: Diagnosis not present

## 2013-10-29 DIAGNOSIS — R0602 Shortness of breath: Secondary | ICD-10-CM | POA: Diagnosis not present

## 2013-11-01 ENCOUNTER — Ambulatory Visit: Payer: Self-pay | Admitting: Internal Medicine

## 2013-11-01 DIAGNOSIS — E7800 Pure hypercholesterolemia, unspecified: Secondary | ICD-10-CM | POA: Diagnosis not present

## 2013-11-01 DIAGNOSIS — D649 Anemia, unspecified: Secondary | ICD-10-CM | POA: Diagnosis not present

## 2013-11-01 DIAGNOSIS — G319 Degenerative disease of nervous system, unspecified: Secondary | ICD-10-CM | POA: Diagnosis not present

## 2013-11-01 DIAGNOSIS — E039 Hypothyroidism, unspecified: Secondary | ICD-10-CM | POA: Diagnosis not present

## 2013-11-01 DIAGNOSIS — I6789 Other cerebrovascular disease: Secondary | ICD-10-CM | POA: Diagnosis not present

## 2013-11-01 DIAGNOSIS — G9389 Other specified disorders of brain: Secondary | ICD-10-CM | POA: Diagnosis not present

## 2013-11-01 DIAGNOSIS — R269 Unspecified abnormalities of gait and mobility: Secondary | ICD-10-CM | POA: Diagnosis not present

## 2013-11-01 DIAGNOSIS — E785 Hyperlipidemia, unspecified: Secondary | ICD-10-CM | POA: Diagnosis not present

## 2013-11-01 IMAGING — CT CT HEAD WITHOUT AND WITH CONTRAST
1 of 2 series · 13 of 30 positions shown, 17 images · IV contrast (agent unspecified)
Comparison: Noncontrast head CT [DATE]

CLINICAL DATA: Unsteady gait. Dizziness for a few weeks. Flashing
lights and right eye. TIA.

EXAM:
CT HEAD WITHOUT AND WITH CONTRAST
TECHNIQUE: Contiguous axial images were obtained from the base of the skull
through the vertex without and with intravenous contrast
CONTRAST:  75 cc [Y9]

[Series 2: head wo · axial · 0.41mm/px · z∈[+206,+321]mm · 13 of 27 slices shown, 17 images]
[im 2/27  brain]
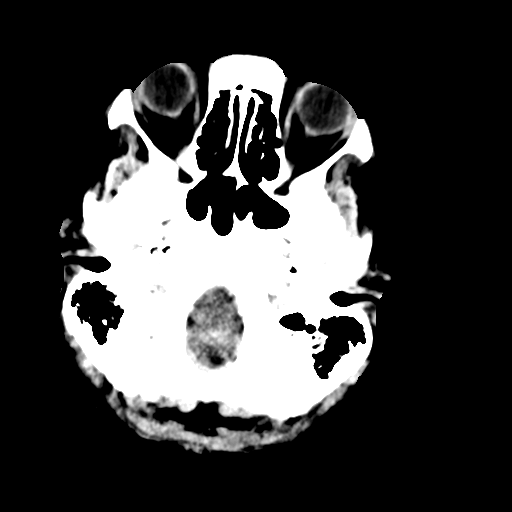
[im 2/27  bone]
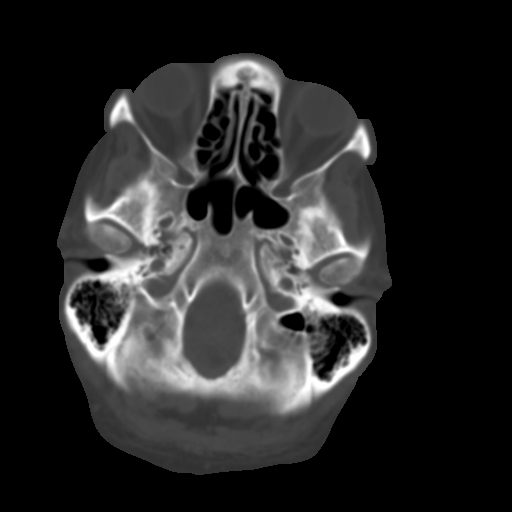
[im 4/27  brain]
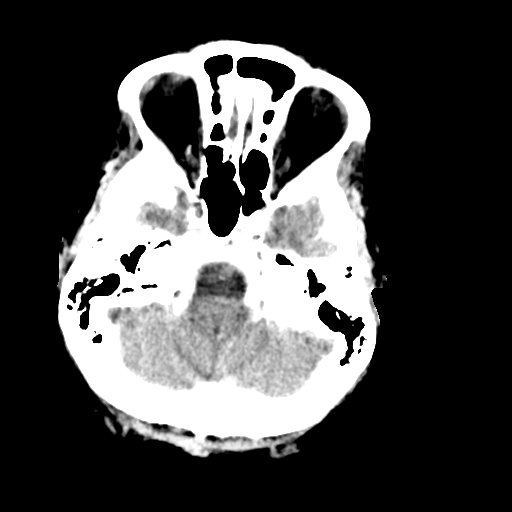
[im 6/27  brain]
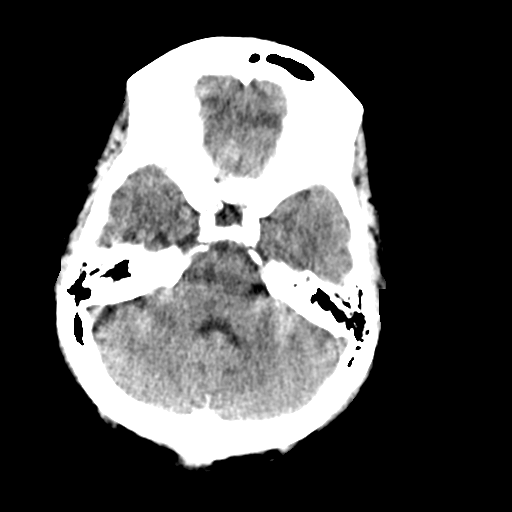
[im 8/27  brain]
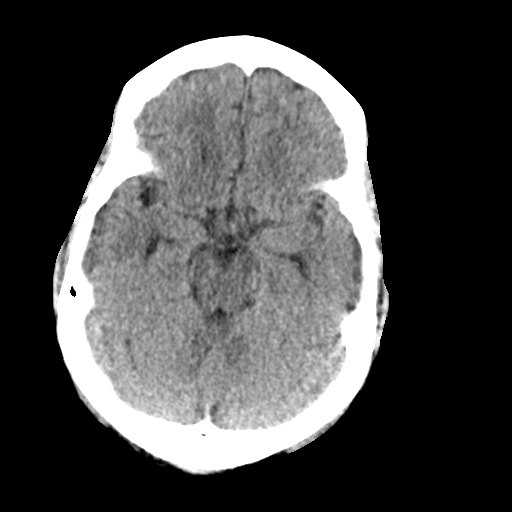
[im 10/27  brain]
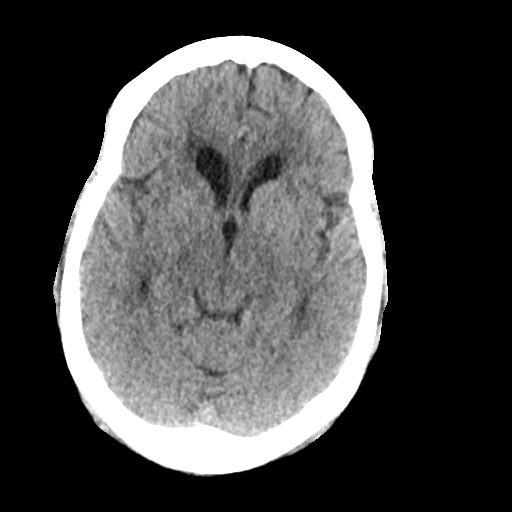
[im 10/27  bone]
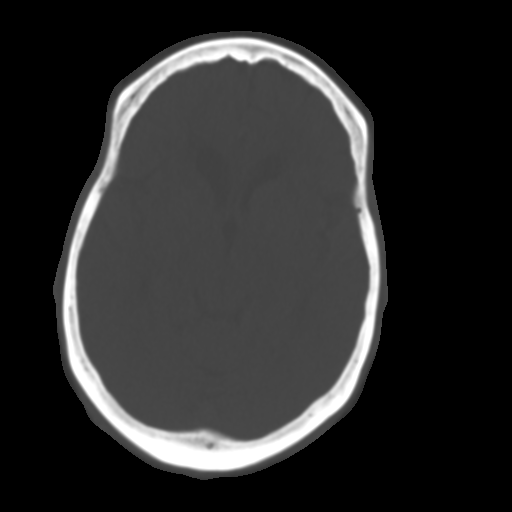
[im 12/27  brain]
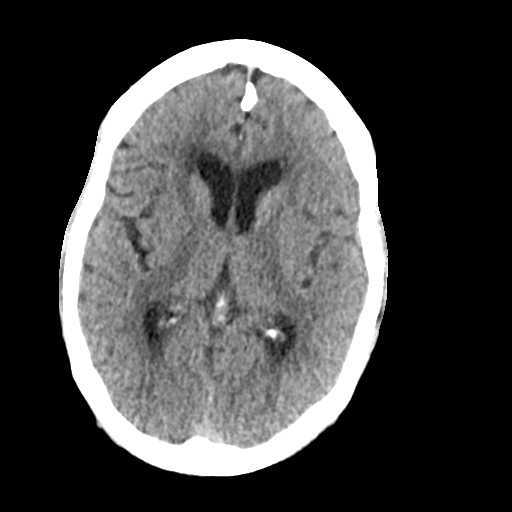
[im 14/27  brain]
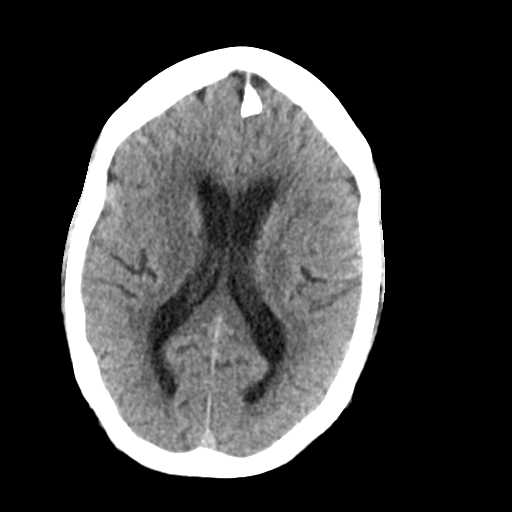
[im 15/27  brain]
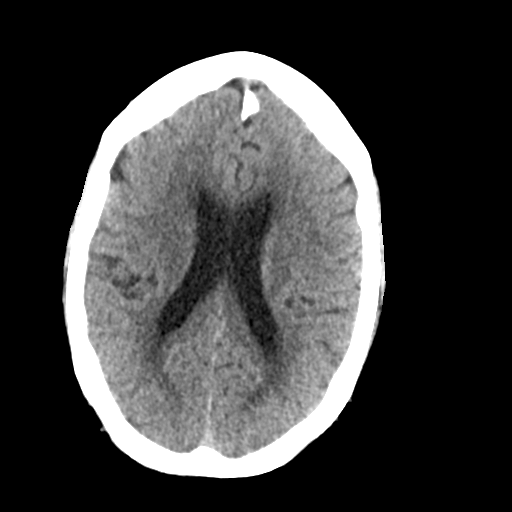
[im 17/27  brain]
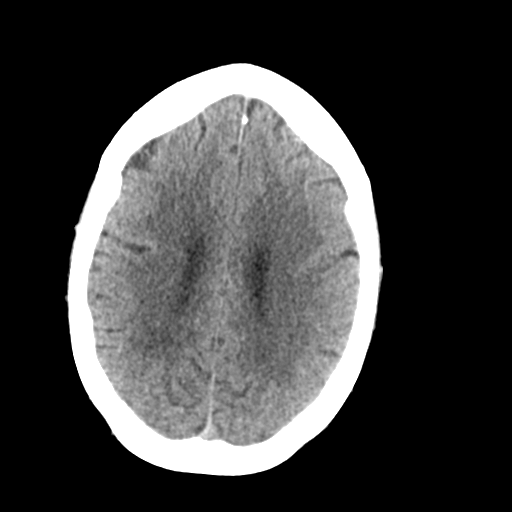
[im 17/27  bone]
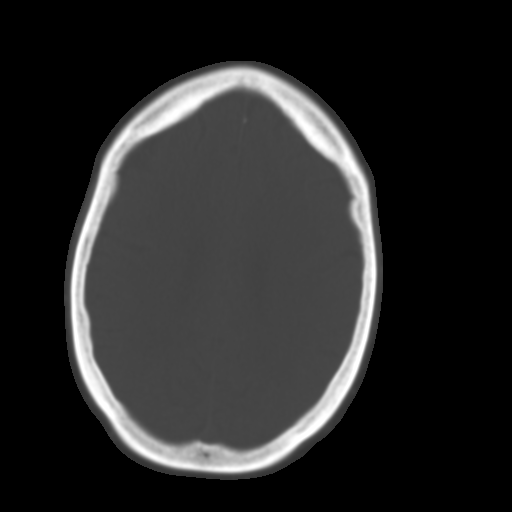
[im 19/27  brain]
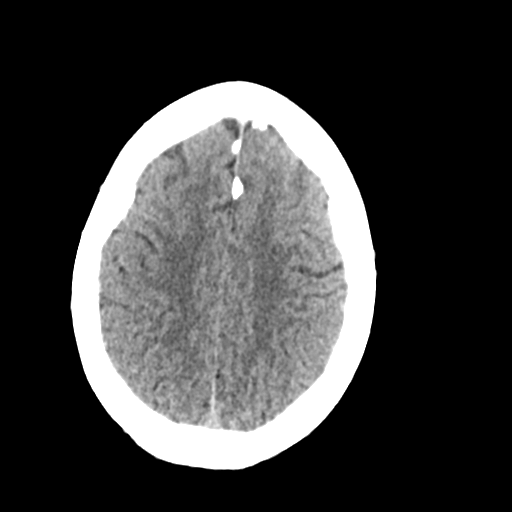
[im 21/27  brain]
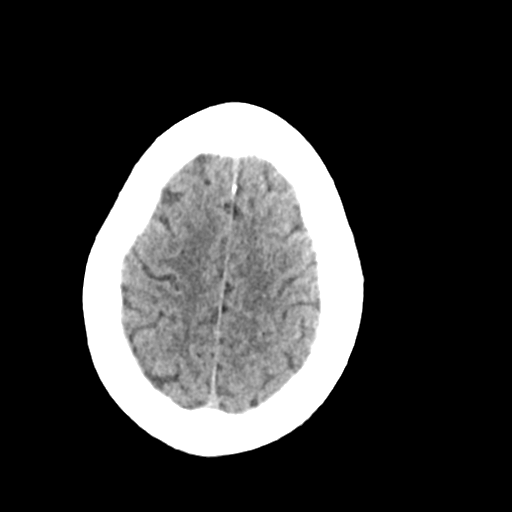
[im 23/27  brain]
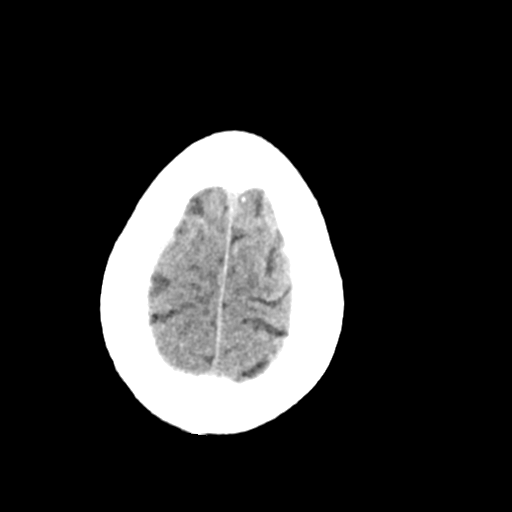
[im 25/27  brain]
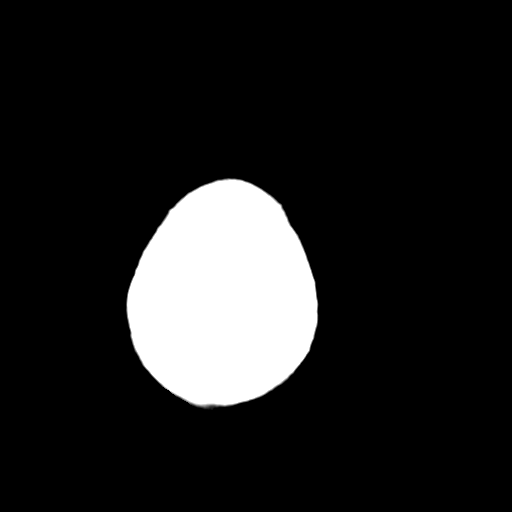
[im 25/27  bone]
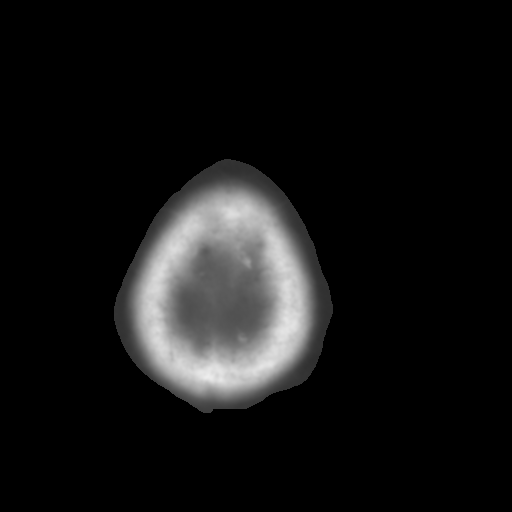

[13 of 30 positions shown; findings below may reference images not displayed]

FINDINGS: Periventricular white matter hypodensities have progressed from the
prior exam, nonspecific but compatible with mild chronic small
vessel ischemic disease. There is also more focal hypoattenuation
posterior to the frontal horn of the right lateral ventricle in the
region of the inferior caudate head, new from prior. There is no
evidence of acute large territory cortical infarct, mass, midline
shift, intracranial hemorrhage, or extra-axial fluid collection.
There is no abnormal enhancement. Visualized mastoid air cells and
paranasal sinuses are clear. Visualized orbits are unremarkable.
IMPRESSION: 1. No evidence of acute large territory cortical infarct.
2. Progressive periventricular white matter chronic small vessel
ischemic change with more focal area of new low density adjacent to
the right frontal horn near the inferior aspect of the caudate,
suggestive of ischemia of indeterminate age.

## 2013-11-05 DIAGNOSIS — G459 Transient cerebral ischemic attack, unspecified: Secondary | ICD-10-CM | POA: Diagnosis not present

## 2013-11-07 DIAGNOSIS — H35059 Retinal neovascularization, unspecified, unspecified eye: Secondary | ICD-10-CM | POA: Diagnosis not present

## 2013-11-26 DIAGNOSIS — M79609 Pain in unspecified limb: Secondary | ICD-10-CM

## 2014-01-07 DIAGNOSIS — H35059 Retinal neovascularization, unspecified, unspecified eye: Secondary | ICD-10-CM | POA: Diagnosis not present

## 2014-01-09 DIAGNOSIS — E109 Type 1 diabetes mellitus without complications: Secondary | ICD-10-CM | POA: Diagnosis not present

## 2014-01-09 DIAGNOSIS — H6690 Otitis media, unspecified, unspecified ear: Secondary | ICD-10-CM | POA: Diagnosis not present

## 2014-01-09 DIAGNOSIS — L049 Acute lymphadenitis, unspecified: Secondary | ICD-10-CM | POA: Diagnosis not present

## 2014-01-09 DIAGNOSIS — J3089 Other allergic rhinitis: Secondary | ICD-10-CM | POA: Diagnosis not present

## 2014-01-21 DIAGNOSIS — E11329 Type 2 diabetes mellitus with mild nonproliferative diabetic retinopathy without macular edema: Secondary | ICD-10-CM | POA: Diagnosis not present

## 2014-01-21 DIAGNOSIS — H353290 Exudative age-related macular degeneration, unspecified eye, stage unspecified: Secondary | ICD-10-CM | POA: Diagnosis not present

## 2014-01-21 DIAGNOSIS — E1139 Type 2 diabetes mellitus with other diabetic ophthalmic complication: Secondary | ICD-10-CM | POA: Diagnosis not present

## 2014-01-23 DIAGNOSIS — H359 Unspecified retinal disorder: Secondary | ICD-10-CM | POA: Diagnosis not present

## 2014-01-23 DIAGNOSIS — N189 Chronic kidney disease, unspecified: Secondary | ICD-10-CM | POA: Diagnosis not present

## 2014-01-23 DIAGNOSIS — E1129 Type 2 diabetes mellitus with other diabetic kidney complication: Secondary | ICD-10-CM | POA: Diagnosis not present

## 2014-01-23 DIAGNOSIS — I10 Essential (primary) hypertension: Secondary | ICD-10-CM | POA: Diagnosis not present

## 2014-01-29 DIAGNOSIS — Z0000 Encounter for general adult medical examination without abnormal findings: Secondary | ICD-10-CM | POA: Diagnosis not present

## 2014-01-29 DIAGNOSIS — E7800 Pure hypercholesterolemia, unspecified: Secondary | ICD-10-CM | POA: Diagnosis not present

## 2014-02-06 DIAGNOSIS — E1139 Type 2 diabetes mellitus with other diabetic ophthalmic complication: Secondary | ICD-10-CM | POA: Diagnosis not present

## 2014-02-06 DIAGNOSIS — E1129 Type 2 diabetes mellitus with other diabetic kidney complication: Secondary | ICD-10-CM | POA: Diagnosis not present

## 2014-02-06 DIAGNOSIS — E11319 Type 2 diabetes mellitus with unspecified diabetic retinopathy without macular edema: Secondary | ICD-10-CM | POA: Diagnosis not present

## 2014-02-06 DIAGNOSIS — E785 Hyperlipidemia, unspecified: Secondary | ICD-10-CM | POA: Diagnosis not present

## 2014-02-21 DIAGNOSIS — D3130 Benign neoplasm of unspecified choroid: Secondary | ICD-10-CM | POA: Diagnosis not present

## 2014-03-26 DIAGNOSIS — H3560 Retinal hemorrhage, unspecified eye: Secondary | ICD-10-CM | POA: Diagnosis not present

## 2014-04-10 DIAGNOSIS — N189 Chronic kidney disease, unspecified: Secondary | ICD-10-CM | POA: Diagnosis not present

## 2014-04-10 DIAGNOSIS — E1129 Type 2 diabetes mellitus with other diabetic kidney complication: Secondary | ICD-10-CM | POA: Diagnosis not present

## 2014-04-10 DIAGNOSIS — E11319 Type 2 diabetes mellitus with unspecified diabetic retinopathy without macular edema: Secondary | ICD-10-CM | POA: Diagnosis not present

## 2014-04-10 DIAGNOSIS — E1139 Type 2 diabetes mellitus with other diabetic ophthalmic complication: Secondary | ICD-10-CM | POA: Diagnosis not present

## 2014-07-01 DIAGNOSIS — I10 Essential (primary) hypertension: Secondary | ICD-10-CM | POA: Diagnosis not present

## 2014-07-01 DIAGNOSIS — H60399 Other infective otitis externa, unspecified ear: Secondary | ICD-10-CM | POA: Diagnosis not present

## 2014-07-01 DIAGNOSIS — E1129 Type 2 diabetes mellitus with other diabetic kidney complication: Secondary | ICD-10-CM | POA: Diagnosis not present

## 2014-07-01 DIAGNOSIS — N189 Chronic kidney disease, unspecified: Secondary | ICD-10-CM | POA: Diagnosis not present

## 2014-07-22 DIAGNOSIS — Z23 Encounter for immunization: Secondary | ICD-10-CM | POA: Diagnosis not present

## 2014-08-16 DIAGNOSIS — E784 Other hyperlipidemia: Secondary | ICD-10-CM | POA: Diagnosis not present

## 2014-08-16 DIAGNOSIS — E669 Obesity, unspecified: Secondary | ICD-10-CM | POA: Diagnosis not present

## 2014-08-16 DIAGNOSIS — E119 Type 2 diabetes mellitus without complications: Secondary | ICD-10-CM | POA: Diagnosis not present

## 2014-08-16 DIAGNOSIS — I10 Essential (primary) hypertension: Secondary | ICD-10-CM | POA: Diagnosis not present

## 2014-08-28 DIAGNOSIS — J3081 Allergic rhinitis due to animal (cat) (dog) hair and dander: Secondary | ICD-10-CM | POA: Diagnosis not present

## 2014-08-28 DIAGNOSIS — N189 Chronic kidney disease, unspecified: Secondary | ICD-10-CM | POA: Diagnosis not present

## 2014-08-28 DIAGNOSIS — I10 Essential (primary) hypertension: Secondary | ICD-10-CM | POA: Diagnosis not present

## 2014-08-28 DIAGNOSIS — E1122 Type 2 diabetes mellitus with diabetic chronic kidney disease: Secondary | ICD-10-CM | POA: Diagnosis not present

## 2014-09-03 DIAGNOSIS — Z23 Encounter for immunization: Secondary | ICD-10-CM | POA: Diagnosis not present

## 2014-09-06 DIAGNOSIS — H3532 Exudative age-related macular degeneration: Secondary | ICD-10-CM | POA: Diagnosis not present

## 2014-09-30 ENCOUNTER — Ambulatory Visit: Payer: Self-pay | Admitting: Internal Medicine

## 2014-09-30 DIAGNOSIS — Z1231 Encounter for screening mammogram for malignant neoplasm of breast: Secondary | ICD-10-CM | POA: Diagnosis not present

## 2014-09-30 IMAGING — MG MM DIGITAL SCREENING BILAT W/ CAD
4 series · 4 of 4 positions shown · non-contrast
Comparison: Previous exam(s).

CLINICAL DATA: Screening.

EXAM:
DIGITAL SCREENING BILATERAL MAMMOGRAM WITH CAD

[R CC]
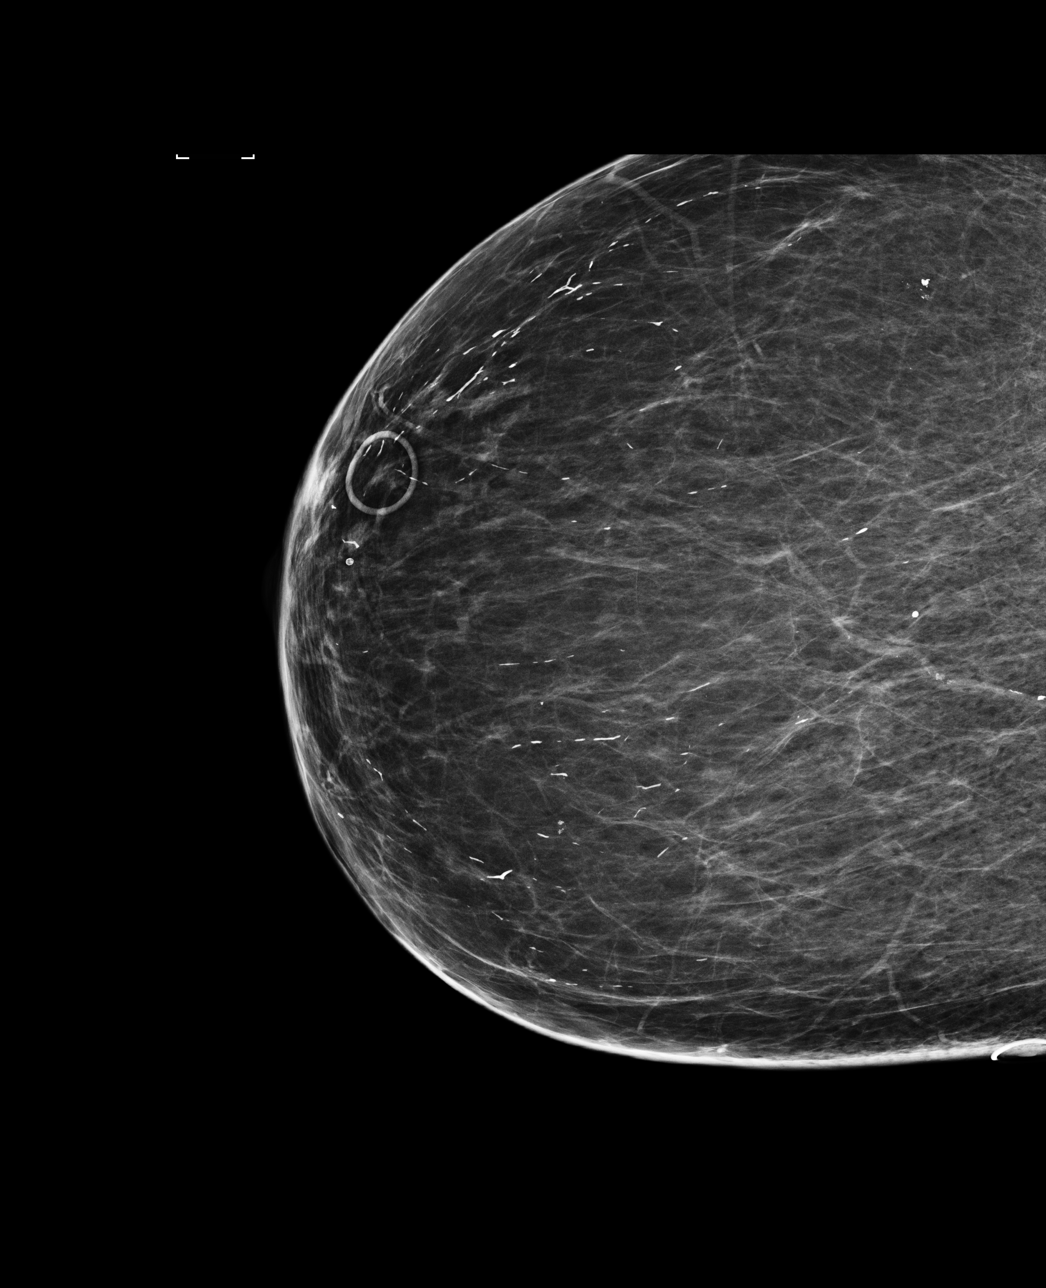

[R MLO]
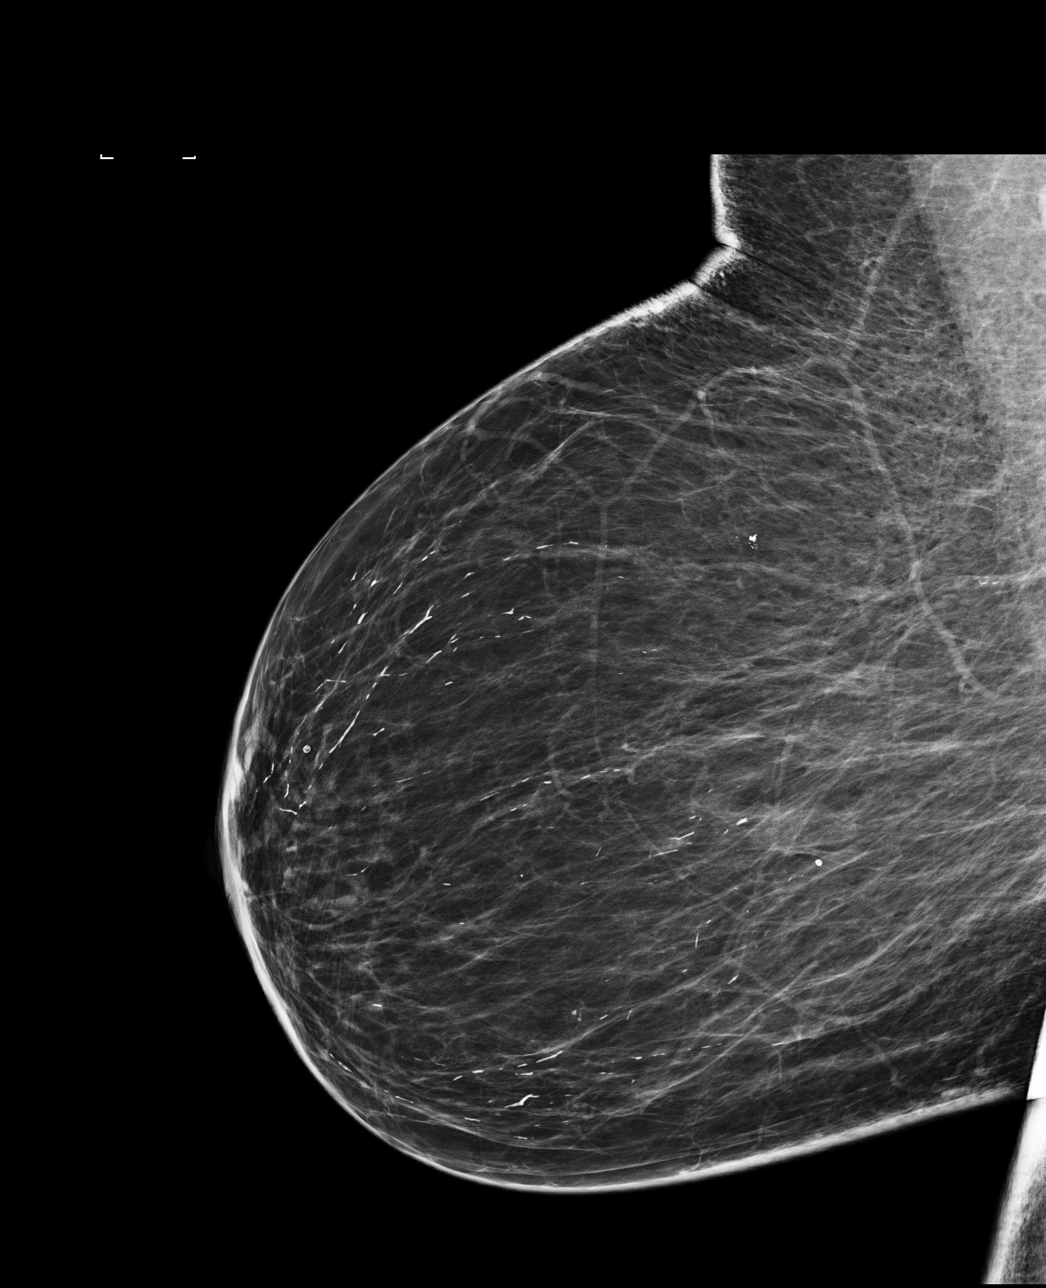

[L MLO]
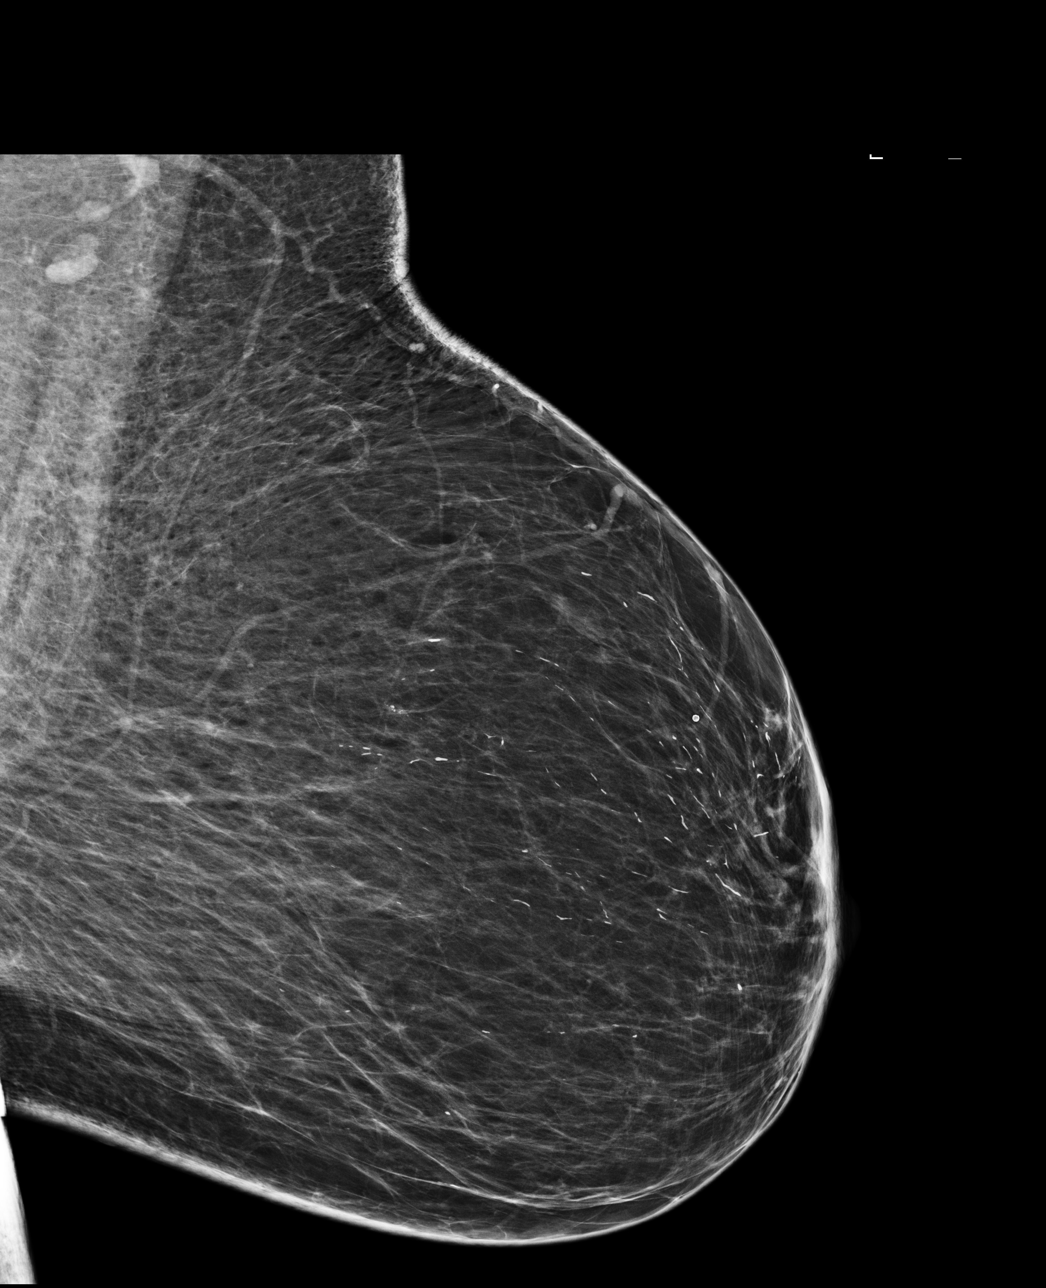

[L CC]
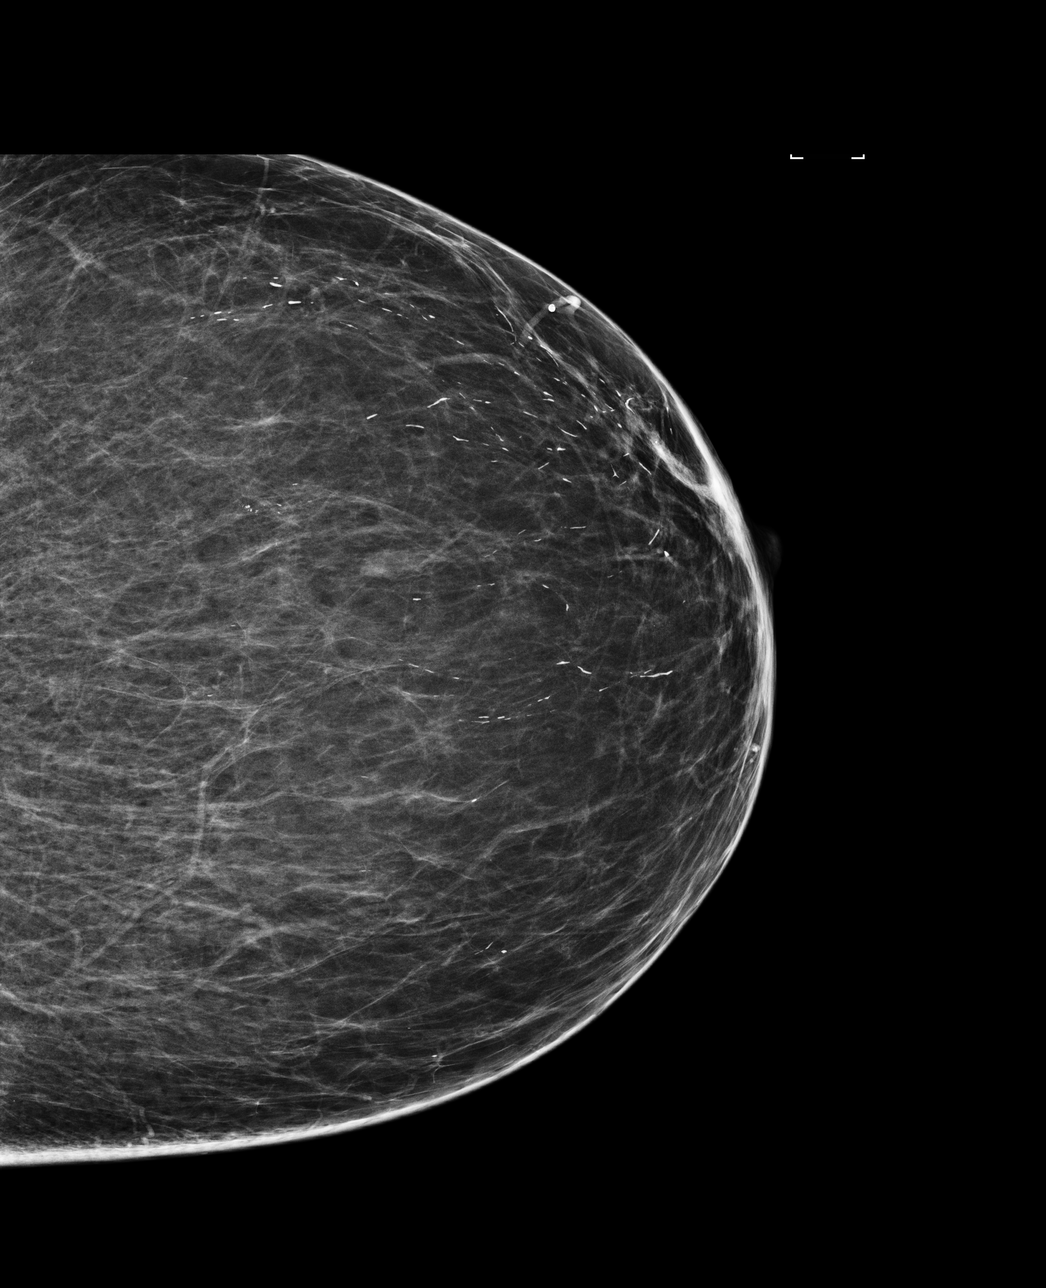

[4 of 4 positions shown; findings below may reference images not displayed]

ACR Breast Density Category b: There are scattered areas of
fibroglandular density.
FINDINGS: There are no findings suspicious for malignancy. Images were
processed with CAD.
IMPRESSION: No mammographic evidence of malignancy. A result letter of this
screening mammogram will be mailed directly to the patient.

RECOMMENDATION:
Screening mammogram in one year. (Code:[US])

BI-RADS CATEGORY  1: Negative.

## 2015-01-20 DIAGNOSIS — E11319 Type 2 diabetes mellitus with unspecified diabetic retinopathy without macular edema: Secondary | ICD-10-CM | POA: Diagnosis not present

## 2015-01-20 DIAGNOSIS — E784 Other hyperlipidemia: Secondary | ICD-10-CM | POA: Diagnosis not present

## 2015-01-20 DIAGNOSIS — E1122 Type 2 diabetes mellitus with diabetic chronic kidney disease: Secondary | ICD-10-CM | POA: Diagnosis not present

## 2015-01-20 DIAGNOSIS — I10 Essential (primary) hypertension: Secondary | ICD-10-CM | POA: Diagnosis not present

## 2015-02-03 DIAGNOSIS — H3532 Exudative age-related macular degeneration: Secondary | ICD-10-CM | POA: Diagnosis not present

## 2015-04-08 DIAGNOSIS — M1990 Unspecified osteoarthritis, unspecified site: Secondary | ICD-10-CM | POA: Diagnosis not present

## 2015-06-02 DIAGNOSIS — R0602 Shortness of breath: Secondary | ICD-10-CM | POA: Diagnosis not present

## 2015-06-02 DIAGNOSIS — E669 Obesity, unspecified: Secondary | ICD-10-CM | POA: Diagnosis not present

## 2015-06-02 DIAGNOSIS — E119 Type 2 diabetes mellitus without complications: Secondary | ICD-10-CM | POA: Diagnosis not present

## 2015-06-02 DIAGNOSIS — E784 Other hyperlipidemia: Secondary | ICD-10-CM | POA: Diagnosis not present

## 2015-06-02 DIAGNOSIS — I10 Essential (primary) hypertension: Secondary | ICD-10-CM | POA: Diagnosis not present

## 2015-06-09 DIAGNOSIS — H3532 Exudative age-related macular degeneration: Secondary | ICD-10-CM | POA: Diagnosis not present

## 2015-06-09 DIAGNOSIS — E11339 Type 2 diabetes mellitus with moderate nonproliferative diabetic retinopathy without macular edema: Secondary | ICD-10-CM | POA: Diagnosis not present

## 2015-06-12 DIAGNOSIS — E1122 Type 2 diabetes mellitus with diabetic chronic kidney disease: Secondary | ICD-10-CM | POA: Diagnosis not present

## 2015-06-12 DIAGNOSIS — I10 Essential (primary) hypertension: Secondary | ICD-10-CM | POA: Diagnosis not present

## 2015-06-12 DIAGNOSIS — E784 Other hyperlipidemia: Secondary | ICD-10-CM | POA: Diagnosis not present

## 2015-06-12 DIAGNOSIS — E119 Type 2 diabetes mellitus without complications: Secondary | ICD-10-CM | POA: Diagnosis not present

## 2015-08-07 ENCOUNTER — Other Ambulatory Visit: Payer: Self-pay | Admitting: Internal Medicine

## 2015-08-07 DIAGNOSIS — M859 Disorder of bone density and structure, unspecified: Secondary | ICD-10-CM

## 2015-08-07 DIAGNOSIS — M8580 Other specified disorders of bone density and structure, unspecified site: Secondary | ICD-10-CM

## 2015-08-07 DIAGNOSIS — Z23 Encounter for immunization: Secondary | ICD-10-CM | POA: Diagnosis not present

## 2015-08-08 ENCOUNTER — Other Ambulatory Visit: Payer: Self-pay | Admitting: Internal Medicine

## 2015-08-08 DIAGNOSIS — M859 Disorder of bone density and structure, unspecified: Secondary | ICD-10-CM

## 2015-08-08 DIAGNOSIS — Z139 Encounter for screening, unspecified: Secondary | ICD-10-CM

## 2015-08-08 DIAGNOSIS — M8580 Other specified disorders of bone density and structure, unspecified site: Secondary | ICD-10-CM

## 2015-08-13 ENCOUNTER — Other Ambulatory Visit: Payer: Self-pay | Admitting: Internal Medicine

## 2015-08-13 DIAGNOSIS — M859 Disorder of bone density and structure, unspecified: Secondary | ICD-10-CM

## 2015-08-14 ENCOUNTER — Ambulatory Visit
Admission: RE | Admit: 2015-08-14 | Discharge: 2015-08-14 | Disposition: A | Discharge status: Home / Self Care | Payer: Medicare Other | Source: Ambulatory Visit | Attending: Internal Medicine | Admitting: Internal Medicine

## 2015-08-14 ENCOUNTER — Ambulatory Visit: Admission: RE | Admit: 2015-08-14 | Payer: Medicare Other | Source: Ambulatory Visit

## 2015-08-14 DIAGNOSIS — Z780 Asymptomatic menopausal state: Secondary | ICD-10-CM | POA: Diagnosis not present

## 2015-08-14 DIAGNOSIS — M859 Disorder of bone density and structure, unspecified: Secondary | ICD-10-CM | POA: Insufficient documentation

## 2015-09-02 ENCOUNTER — Other Ambulatory Visit: Payer: Self-pay | Admitting: Internal Medicine

## 2015-09-02 DIAGNOSIS — E11319 Type 2 diabetes mellitus with unspecified diabetic retinopathy without macular edema: Secondary | ICD-10-CM | POA: Diagnosis not present

## 2015-09-02 DIAGNOSIS — M8580 Other specified disorders of bone density and structure, unspecified site: Secondary | ICD-10-CM | POA: Diagnosis not present

## 2015-09-02 DIAGNOSIS — E119 Type 2 diabetes mellitus without complications: Secondary | ICD-10-CM | POA: Diagnosis not present

## 2015-09-02 DIAGNOSIS — Z1231 Encounter for screening mammogram for malignant neoplasm of breast: Secondary | ICD-10-CM

## 2015-09-02 DIAGNOSIS — M545 Low back pain: Secondary | ICD-10-CM | POA: Diagnosis not present

## 2015-10-03 ENCOUNTER — Ambulatory Visit
Admission: RE | Admit: 2015-10-03 | Discharge: 2015-10-03 | Disposition: A | Discharge status: Home / Self Care | Payer: Medicare Other | Source: Ambulatory Visit | Attending: Internal Medicine | Admitting: Internal Medicine

## 2015-10-03 DIAGNOSIS — Z1231 Encounter for screening mammogram for malignant neoplasm of breast: Secondary | ICD-10-CM | POA: Insufficient documentation

## 2015-10-03 IMAGING — MG MM DIGITAL SCREENING BILAT W/ CAD
1 series · 4 of 4 positions shown · non-contrast
Comparison: Previous exam(s).

CLINICAL DATA: Screening.

EXAM:
DIGITAL SCREENING BILATERAL MAMMOGRAM WITH CAD

[R CC · right · 4 of 4 slices shown]
[im 1/4]
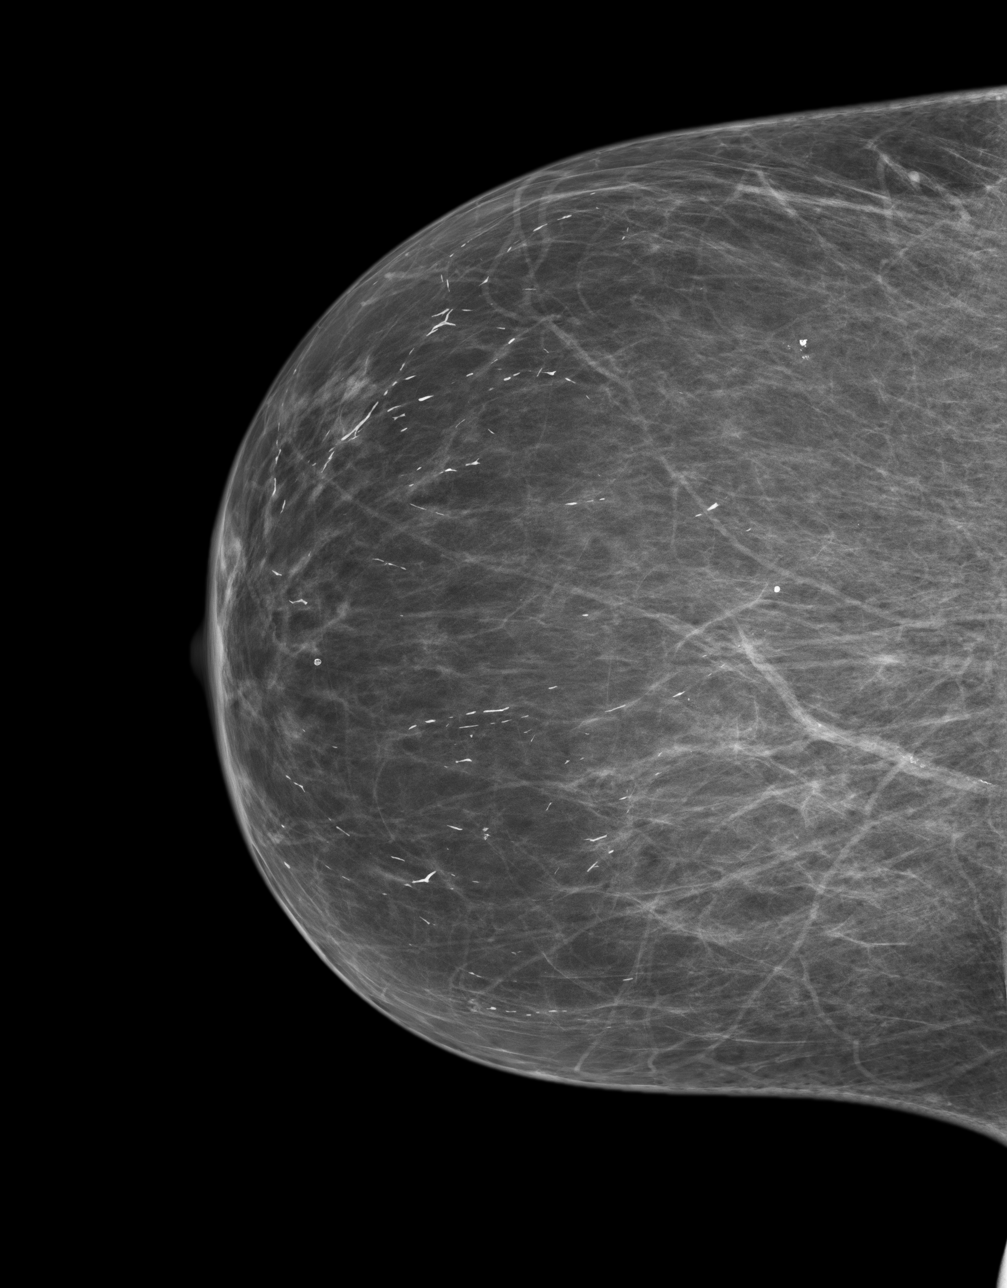
[im 2/4]
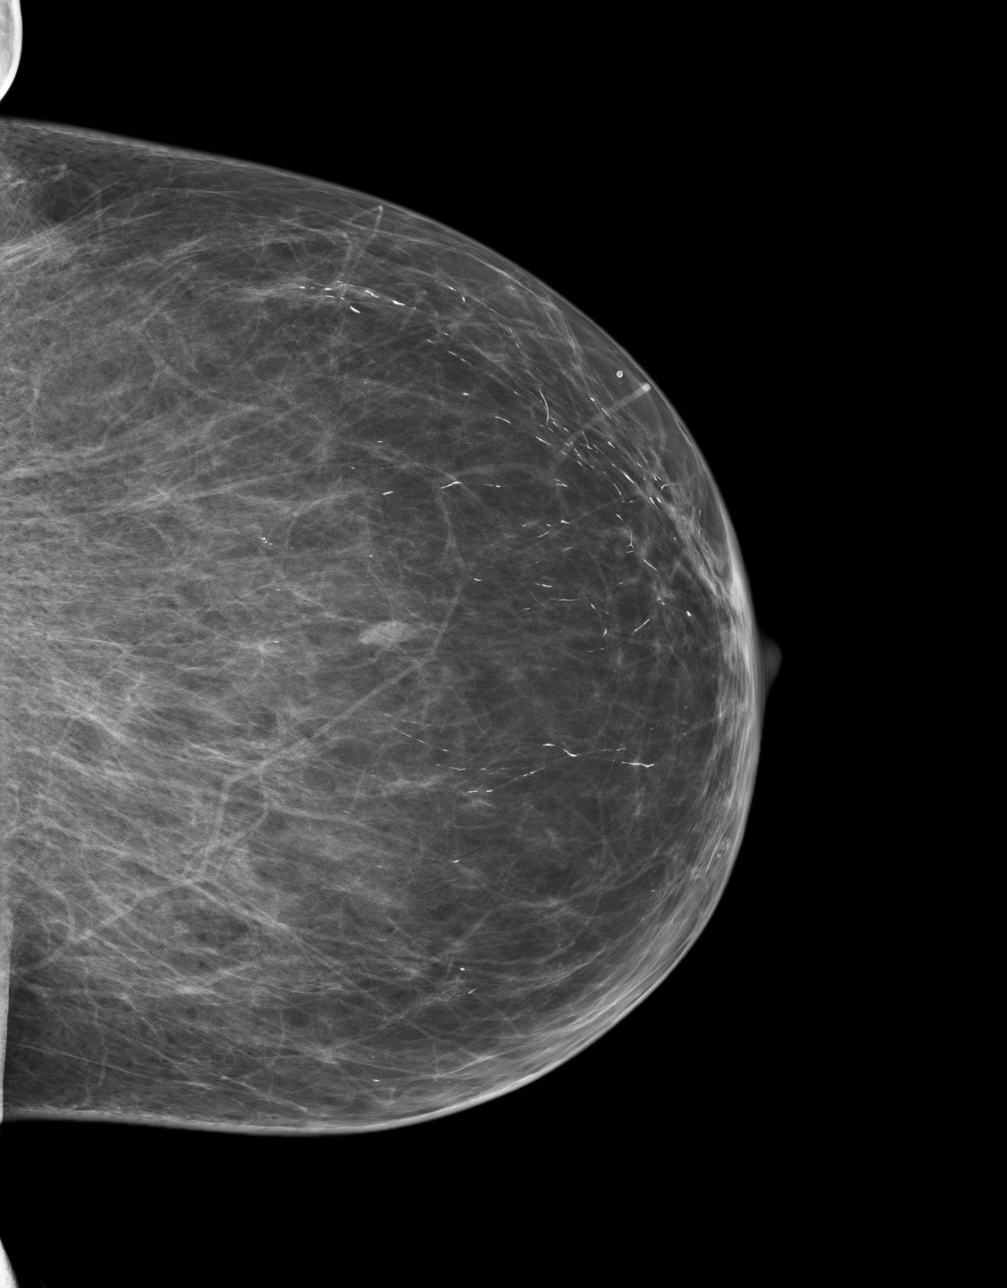
[im 3/4]
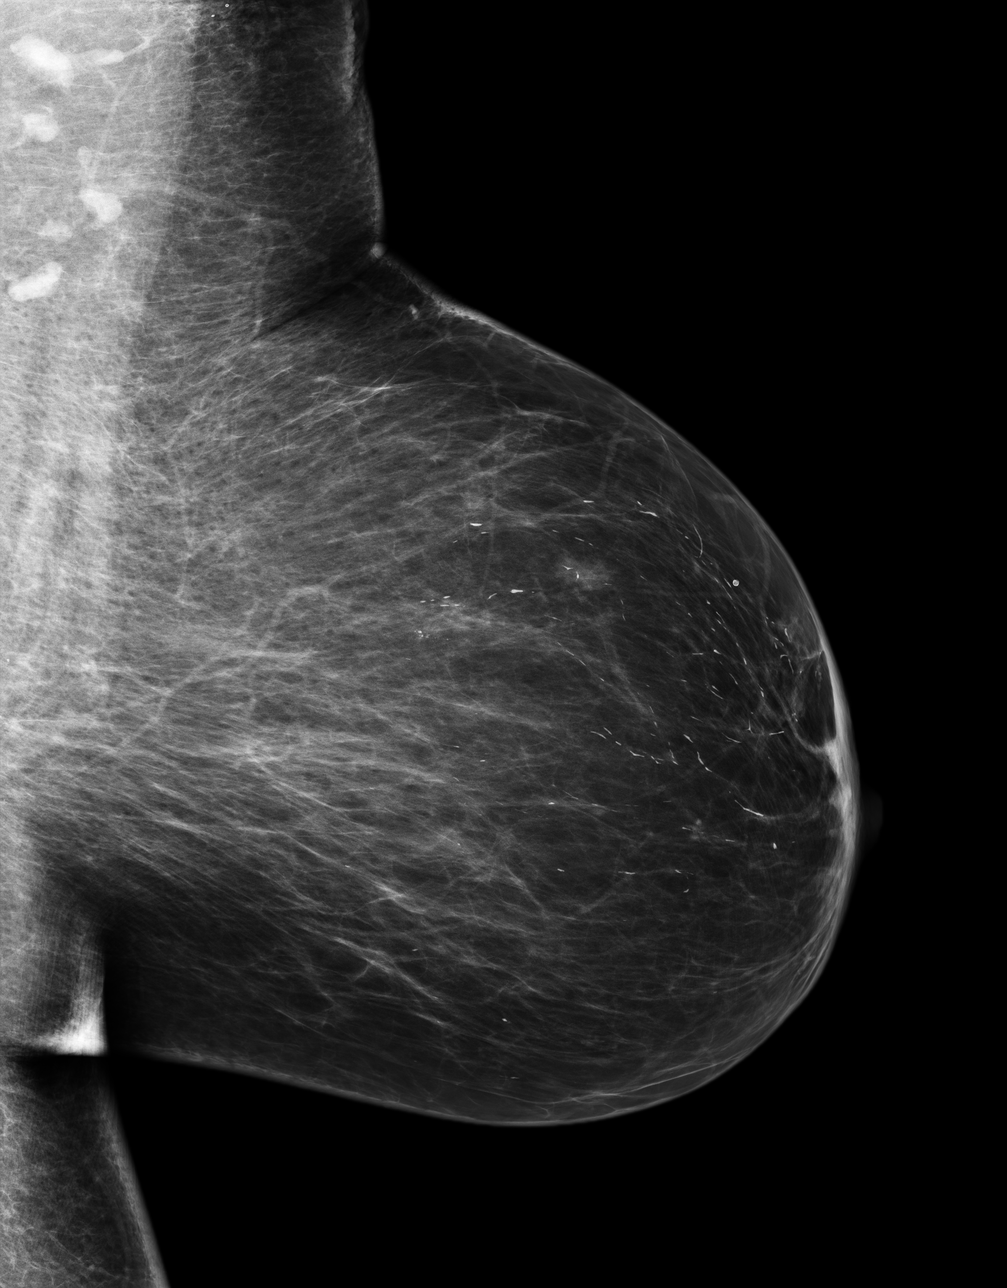
[im 4/4]
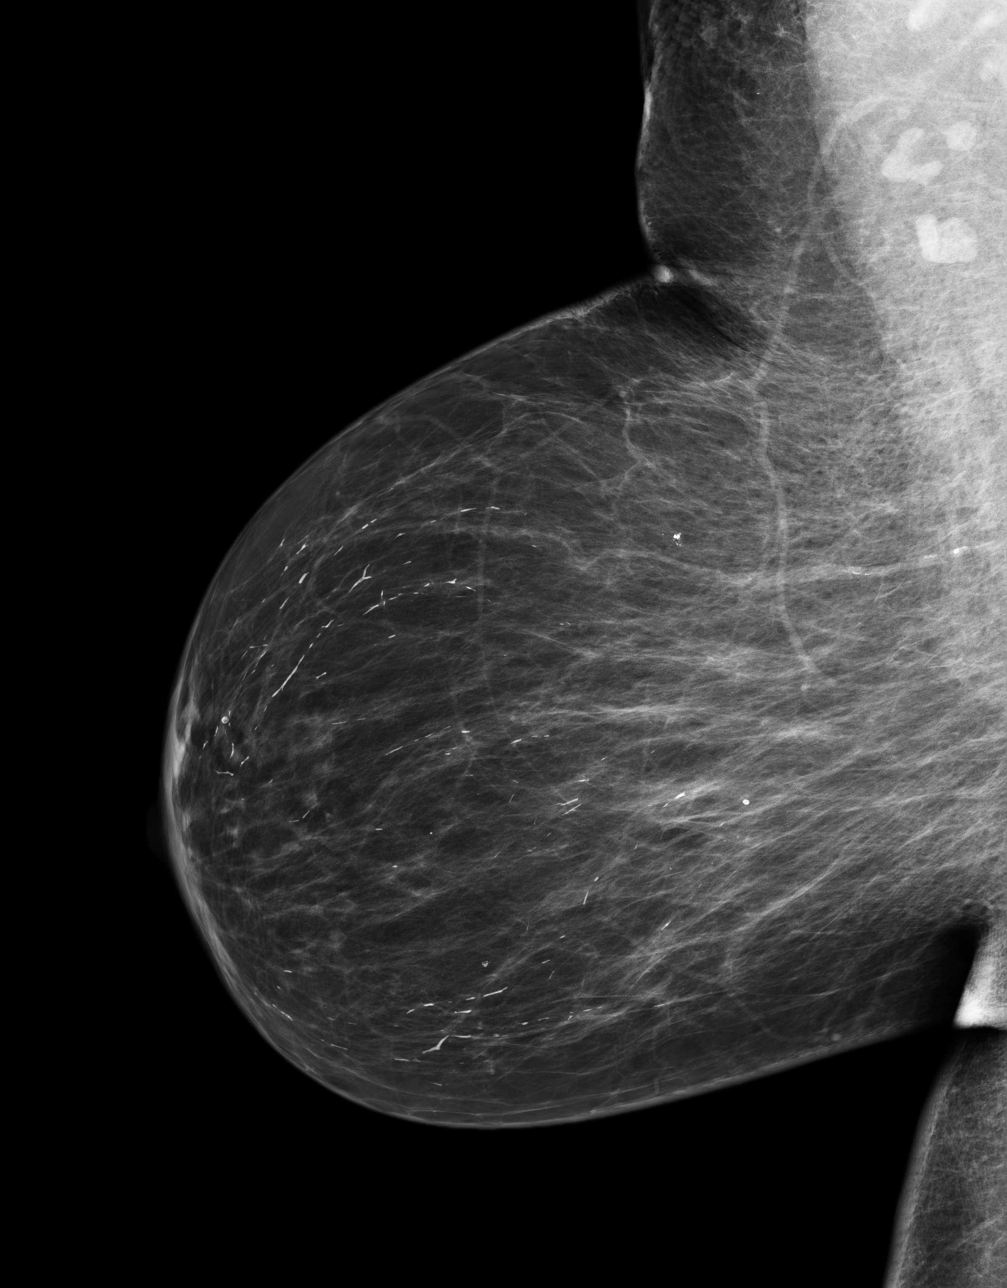

[4 of 4 positions shown; findings below may reference images not displayed]

ACR Breast Density Category b: There are scattered areas of
fibroglandular density.
FINDINGS: There are no findings suspicious for malignancy. Images were
processed with CAD.
IMPRESSION: No mammographic evidence of malignancy. A result letter of this
screening mammogram will be mailed directly to the patient.

RECOMMENDATION:
Screening mammogram in one year. (Code:[US])

BI-RADS CATEGORY  1: Negative.

## 2015-12-19 ENCOUNTER — Emergency Department
Admission: EM | Admit: 2015-12-19 | Discharge: 2015-12-19 | Disposition: A | Discharge status: Home / Self Care | Payer: Medicare Other | Attending: Emergency Medicine | Admitting: Emergency Medicine

## 2015-12-19 ENCOUNTER — Encounter: Payer: Self-pay | Admitting: Urgent Care

## 2015-12-19 DIAGNOSIS — L0231 Cutaneous abscess of buttock: Secondary | ICD-10-CM | POA: Insufficient documentation

## 2015-12-19 DIAGNOSIS — Z794 Long term (current) use of insulin: Secondary | ICD-10-CM | POA: Diagnosis not present

## 2015-12-19 DIAGNOSIS — E119 Type 2 diabetes mellitus without complications: Secondary | ICD-10-CM | POA: Diagnosis not present

## 2015-12-19 DIAGNOSIS — Z79899 Other long term (current) drug therapy: Secondary | ICD-10-CM | POA: Diagnosis not present

## 2015-12-19 MED ORDER — CLINDAMYCIN HCL 300 MG PO CAPS: 300.0000 mg | ORAL_CAPSULE | Freq: Three times a day (TID) | ORAL | Status: DC

## 2015-12-19 MED ORDER — TRAMADOL HCL 50 MG PO TABS: 50.0000 mg | ORAL_TABLET | Freq: Four times a day (QID) | ORAL | Status: AC | PRN

## 2015-12-19 MED ORDER — LIDOCAINE-EPINEPHRINE (PF) 2 %-1:200000 IJ SOLN
10.0000 mL | Freq: Once | INTRAMUSCULAR | Status: DC
  Filled 2015-12-19: qty 10

## 2015-12-19 NOTE — ED Notes (Signed)
On assessment with md pt with approx 1 inch circular bleeding abscess noted to posterior right lower labia and perineum border. No drainage other than sanginous noted.

## 2015-12-19 NOTE — ED Notes (Signed)
Patient presents with an abscess to her RIGHT buttock that has been in place x 2 days. (+) drainage. Patient was seen by Dr. Lavera Guise today and referred to "another doctor" - patient states, "They wouldn't answer the phone and it started bleeding more, so here I am." Denies fever.

## 2015-12-19 NOTE — Discharge Instructions (Signed)
Abscess °An abscess is an infected area that contains a collection of pus and debris. It can occur in almost any part of the body. An abscess is also known as a furuncle or boil. °CAUSES  °An abscess occurs when tissue gets infected. This can occur from blockage of oil or sweat glands, infection of hair follicles, or a minor injury to the skin. As the body tries to fight the infection, pus collects in the area and creates pressure under the skin. This pressure causes pain. People with weakened immune systems have difficulty fighting infections and get certain abscesses more often.  °SYMPTOMS °Usually an abscess develops on the skin and becomes a painful mass that is red, warm, and tender. If the abscess forms under the skin, you may feel a moveable soft area under the skin. Some abscesses break open (rupture) on their own, but most will continue to get worse without care. The infection can spread deeper into the body and eventually into the bloodstream, causing you to feel ill.  °DIAGNOSIS  °Your caregiver will take your medical history and perform a physical exam. A sample of fluid may also be taken from the abscess to determine what is causing your infection. °TREATMENT  °Your caregiver may prescribe antibiotic medicines to fight the infection. However, taking antibiotics alone usually does not cure an abscess. Your caregiver may need to make a small cut (incision) in the abscess to drain the pus. In some cases, gauze is packed into the abscess to reduce pain and to continue draining the area. °HOME CARE INSTRUCTIONS  °· Only take over-the-counter or prescription medicines for pain, discomfort, or fever as directed by your caregiver. °· If you were prescribed antibiotics, take them as directed. Finish them even if you start to feel better. °· If gauze is used, follow your caregiver's directions for changing the gauze. °· To avoid spreading the infection: °· Keep your draining abscess covered with a  bandage. °· Wash your hands well. °· Do not share personal care items, towels, or whirlpools with others. °· Avoid skin contact with others. °· Keep your skin and clothes clean around the abscess. °· Keep all follow-up appointments as directed by your caregiver. °SEEK MEDICAL CARE IF:  °· You have increased pain, swelling, redness, fluid drainage, or bleeding. °· You have muscle aches, chills, or a general ill feeling. °· You have a fever. °MAKE SURE YOU:  °· Understand these instructions. °· Will watch your condition. °· Will get help right away if you are not doing well or get worse. °  °This information is not intended to replace advice given to you by your health care provider. Make sure you discuss any questions you have with your health care provider. °  °Document Released: 07/14/2005 Document Revised: 04/04/2012 Document Reviewed: 12/17/2011 °Elsevier Interactive Patient Education ©2016 Elsevier Inc[employer] ° °Incision and Drainage °Incision and drainage is a procedure in which a sac-like structure (cystic structure) is opened and drained. The area to be drained usually contains material such as pus, fluid, or blood.  °LET YOUR CAREGIVER KNOW ABOUT:  °· Allergies to medicine. °· Medicines taken, including vitamins, herbs, eyedrops, over-the-counter medicines, and creams. °· Use of steroids (by mouth or creams). °· Previous problems with anesthetics or numbing medicines. °· History of bleeding problems or blood clots. °· Previous surgery. °· Other health problems, including diabetes and kidney problems. °· Possibility of pregnancy, if this applies. °RISKS AND COMPLICATIONS °· Pain. °· Bleeding. °· Scarring. °· Infection. °BEFORE THE PROCEDURE  °  You may need to have an ultrasound or other imaging tests to see how large or deep your cystic structure is. Blood tests may also be used to determine if you have an infection or how severe the infection is. You may need to have a tetanus shot. °PROCEDURE  °The affected area  is cleaned with a cleaning fluid. The cyst area will then be numbed with a medicine (local anesthetic). A small incision will be made in the cystic structure. A syringe or catheter may be used to drain the contents of the cystic structure, or the contents may be squeezed out. The area will then be flushed with a cleansing solution. After cleansing the area, it is often gently packed with a gauze or another wound dressing. Once it is packed, it will be covered with gauze and tape or some other type of wound dressing.  °AFTER THE PROCEDURE  °· Often, you will be allowed to go home right after the procedure. °· You may be given antibiotic medicine to prevent or heal an infection. °· If the area was packed with gauze or some other wound dressing, you will likely need to come back in 1 to 2 days to get it removed. °· The area should heal in about 14 days. °  °This information is not intended to replace advice given to you by your health care provider. Make sure you discuss any questions you have with your health care provider. °  °Document Released: 03/30/2001 Document Revised: 04/04/2012 Document Reviewed: 11/29/2011 °Elsevier Interactive Patient Education ©2016 Elsevier Inc. ° °

## 2015-12-19 NOTE — ED Notes (Signed)
i and d set up complete. Pt updated on treatment plan. Pt verbalizes understanding. Pt anxious regarding i and d. Pt reassured with verbal.

## 2015-12-19 NOTE — ED Provider Notes (Signed)
JMHANDP.JMHAND >JMHAND >JMHAND JMHANDP.JMHANDP.[facility] Emergency Department Provider Note  ____________________________________________   I have reviewed the triage vital signs and the nursing notes.   HISTORY  Chief Complaint Abscess    HPI Carrie Kane is a 75 y.o. female who presents today with abscess on her buttocks for the last 2 days. Nothing makes it better and sitting on it makes it worse. Did have some slight drainage for it. Has had no fever or chills. Does not have any other systemic illness. No history of same. Does have a history of diabetes. States her sugars up and well controlled.It is painful. Sharp pain.  Past Medical History  Diagnosis Date  . Diabetes ([place])   . HBP (high blood pressure)     There are no active problems to display for this patient.   Past Surgical History  Procedure Laterality Date  . Abdominal hysterectomy      Current Outpatient Rx  Name  Route  Sig  Dispense  Refill  . benazepril-hydrochlorthiazide (LOTENSIN HCT) 20-12.5 MG per tablet   Oral   Take 1 tablet by mouth daily.         . cloNIDine (CATAPRES) 0.3 MG tablet   Oral   Take 0.3 mg by mouth 2 (two) times daily.         . fluvastatin (LESCOL) 40 MG capsule   Oral   Take 40 mg by mouth at bedtime.         . insulin glargine (LANTUS) 100 UNIT/ML injection   Subcutaneous   Inject 45 Units into the skin at bedtime.         Marland Kitchen labetalol (NORMODYNE) 100 MG tablet   Oral   Take 100 mg by mouth 2 (two) times daily.            Allergies Flexeril  Family History  Problem Relation Age of Onset  . Breast cancer Maternal Aunt     Social History Social History  Substance Use Topics  . Smoking status: Never Smoker   . Smokeless tobacco: Never Used  . Alcohol Use: No    Review of Systems Constitutional: No fever/chills Eyes: No visual changes. ENT: No sore throat. No stiff neck no neck pain Cardiovascular: Denies  chest pain. Respiratory: Denies shortness of breath. Gastrointestinal:   no vomiting.  No diarrhea.  No constipation. Genitourinary: Negative for dysuria. Musculoskeletal: Negative lower extremity swelling Skin: See history of present illness Neurological: Negative for headaches, focal weakness or numbness. 10-point ROS otherwise negative.  ____________________________________________   PHYSICAL EXAM:  VITAL SIGNS: ED Triage Vitals  Enc Vitals Group     BP 12/19/15 1926 238/95 mmHg     Pulse Rate 12/19/15 1926 116     Resp 12/19/15 1926 16     Temp 12/19/15 1926 97.7 F (36.5 C)     Temp Source 12/19/15 1926 Oral     SpO2 12/19/15 1926 96 %     Weight 12/19/15 1926 198 lb (89.812 kg)     Height 12/19/15 1926 5\' 8"  (1.727 m)     Head Cir --      Peak Flow --      Pain Score 12/19/15 1927 8     Pain Loc --      Pain Edu? --      Excl. in [place]? --     Constitutional: Alert and oriented. Well appearing and in no acute distress. Cardiovascular: Normal rate, regular rhythm. Grossly normal heart sounds.  Good  peripheral circulation. Respiratory: Normal respiratory effort.  No retractions. Lungs CTAB. Abdominal: Soft and nontender. No distention. No guarding no rebound Back:  There is no focal tenderness or step off there is no midline tenderness there are no lesions noted. there is no CVA tenderness Musculoskeletal: No lower extremity tenderness. No joint effusions, no DVT signs strong distal pulses no edema Neurologic:  Normal speech and language. No gross focal neurologic deficits are appreciated.  Skin:  Skin is warm, dry and intact. He is perhaps a 2  cm abscess, isolated with no evidence of cellulitis to the medial right buttock, with no evidence of perirectal involvement. It is far from the anus. There is no surround cellulitis or induration. It is raised, and angry appearing. Psychiatric: Mood and affect are normal. Speech and behavior are  normal.  ____________________________________________   LABS (all labs ordered are listed, but only abnormal results are displayed)  Labs Reviewed  WOUND CULTURE   ____________________________________________  EKG  I personally interpreted any EKGs ordered by me or triage  ____________________________________________  RADIOLOGY  I reviewed any imaging ordered by me or triage that were performed during my shift ____________________________________________   PROCEDURES  Procedure(s) performed:   I&D of abscess verbal consent obtained, patient's location agreed upon with patient and the nursing staff, timeout performed. Sterile preparation with iodine, using 4 cc of 1% lidocaine with epi, I was able to anesthetize the area. Using an 11 blade and made a proximally 1.5 cm incision into the wound. There was approximately 3 cc of purulent material. Irrigated the area, investigated and found no loculations, did place packing, patient tolerated the procedure well. No complications. Sterile dressing placed.,    Critical Care performed: None  ____________________________________________   INITIAL IMPRESSION / ASSESSMENT AND PLAN / ED COURSE  Pertinent labs & imaging results that were available during my care of the patient were reviewed by me and considered in my medical decision making (see chart for details).  Patient with a small raised abscess. Could be infected cyst. I indeed did provide patient with complete relief. There is worsening she isn't purulent material. Patient does have a doctor. Return precautions and follow-up given and understood. Initial blood pressure was asymptomatically high and likely secondary to anxiety about the procedure with the patient expressed. However she tolerated very well Did advise her to get her blood pressure rechecked as an outpatient. Extensive return precautions given for incidental hypertension as well as her abscess. Given that she is  diabetic I will start her on a few days of clindamycin.  FINAL CLINICAL IMPRESSION(S) / ED DIAGNOSES  Final diagnoses:  None      This chart was dictated using voice recognition software.  Despite best efforts to proofread,  errors can occur which can change meaning.     Schuyler Amor, MD 12/19/15 2125

## 2015-12-24 LAB — WOUND CULTURE
CULTURE: NORMAL
Special Requests: NORMAL

## 2016-02-04 DIAGNOSIS — D649 Anemia, unspecified: Secondary | ICD-10-CM | POA: Diagnosis not present

## 2016-02-04 DIAGNOSIS — N289 Disorder of kidney and ureter, unspecified: Secondary | ICD-10-CM | POA: Diagnosis not present

## 2016-05-07 ENCOUNTER — Emergency Department
Admission: EM | Admit: 2016-05-07 | Discharge: 2016-05-07 | Disposition: A | Discharge status: Home / Self Care | Payer: Medicare Other | Attending: Emergency Medicine | Admitting: Emergency Medicine

## 2016-05-07 DIAGNOSIS — Z5321 Procedure and treatment not carried out due to patient leaving prior to being seen by health care provider: Secondary | ICD-10-CM | POA: Diagnosis not present

## 2016-05-07 DIAGNOSIS — X58XXXA Exposure to other specified factors, initial encounter: Secondary | ICD-10-CM | POA: Insufficient documentation

## 2016-05-07 DIAGNOSIS — T161XXA Foreign body in right ear, initial encounter: Secondary | ICD-10-CM | POA: Diagnosis not present

## 2016-05-07 DIAGNOSIS — Y939 Activity, unspecified: Secondary | ICD-10-CM | POA: Insufficient documentation

## 2016-05-07 DIAGNOSIS — Y999 Unspecified external cause status: Secondary | ICD-10-CM | POA: Insufficient documentation

## 2016-05-07 DIAGNOSIS — Y929 Unspecified place or not applicable: Secondary | ICD-10-CM | POA: Diagnosis not present

## 2016-05-07 DIAGNOSIS — H6063 Unspecified chronic otitis externa, bilateral: Secondary | ICD-10-CM | POA: Diagnosis not present

## 2016-05-07 NOTE — ED Notes (Signed)
Patient ambulatory to triage with steady gait, without difficulty or distress noted; pt st seen by Dr Lavera Guise yesterday and told ?cotton to right ear and was to make her an appt with ENT; pt c/o pain

## 2016-09-07 ENCOUNTER — Other Ambulatory Visit: Payer: Self-pay | Admitting: Internal Medicine

## 2016-09-07 DIAGNOSIS — Z1231 Encounter for screening mammogram for malignant neoplasm of breast: Secondary | ICD-10-CM

## 2016-09-29 ENCOUNTER — Other Ambulatory Visit: Payer: Self-pay | Admitting: Internal Medicine

## 2016-09-29 DIAGNOSIS — R040 Epistaxis: Secondary | ICD-10-CM

## 2016-09-29 DIAGNOSIS — R42 Dizziness and giddiness: Secondary | ICD-10-CM

## 2016-09-30 ENCOUNTER — Ambulatory Visit
Admission: RE | Admit: 2016-09-30 | Discharge: 2016-09-30 | Disposition: A | Discharge status: Home / Self Care | Payer: Medicare Other | Source: Ambulatory Visit | Attending: Internal Medicine | Admitting: Internal Medicine

## 2016-09-30 DIAGNOSIS — R040 Epistaxis: Secondary | ICD-10-CM | POA: Diagnosis not present

## 2016-09-30 DIAGNOSIS — R42 Dizziness and giddiness: Secondary | ICD-10-CM | POA: Diagnosis present

## 2016-10-05 ENCOUNTER — Ambulatory Visit
Admission: RE | Admit: 2016-10-05 | Discharge: 2016-10-05 | Disposition: A | Discharge status: Home / Self Care | Payer: Medicare Other | Source: Ambulatory Visit | Attending: Internal Medicine | Admitting: Internal Medicine

## 2016-10-05 DIAGNOSIS — Z1231 Encounter for screening mammogram for malignant neoplasm of breast: Secondary | ICD-10-CM | POA: Insufficient documentation

## 2016-10-05 IMAGING — MG MM DIGITAL SCREENING BILAT W/ TOMO W/ CAD
8 of 12 series · 8 of 28 positions shown · non-contrast
Comparison: Previous exam(s).

CLINICAL DATA: Screening.

EXAM:
2D DIGITAL SCREENING BILATERAL MAMMOGRAM WITH CAD AND ADJUNCT TOMO

[L CC]
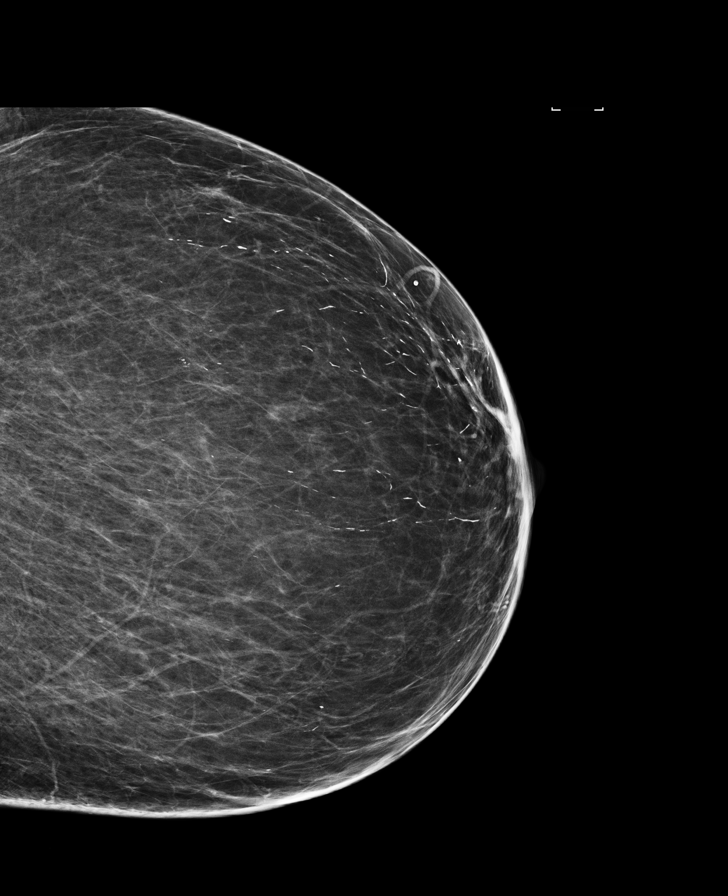

[L MLO synth-2D]
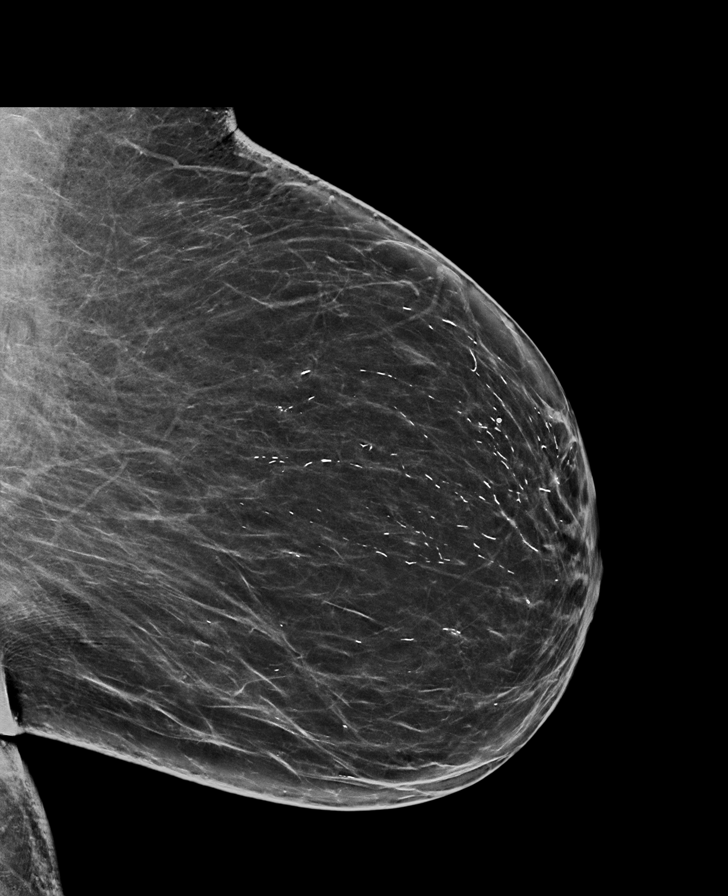

[L CC synth-2D]
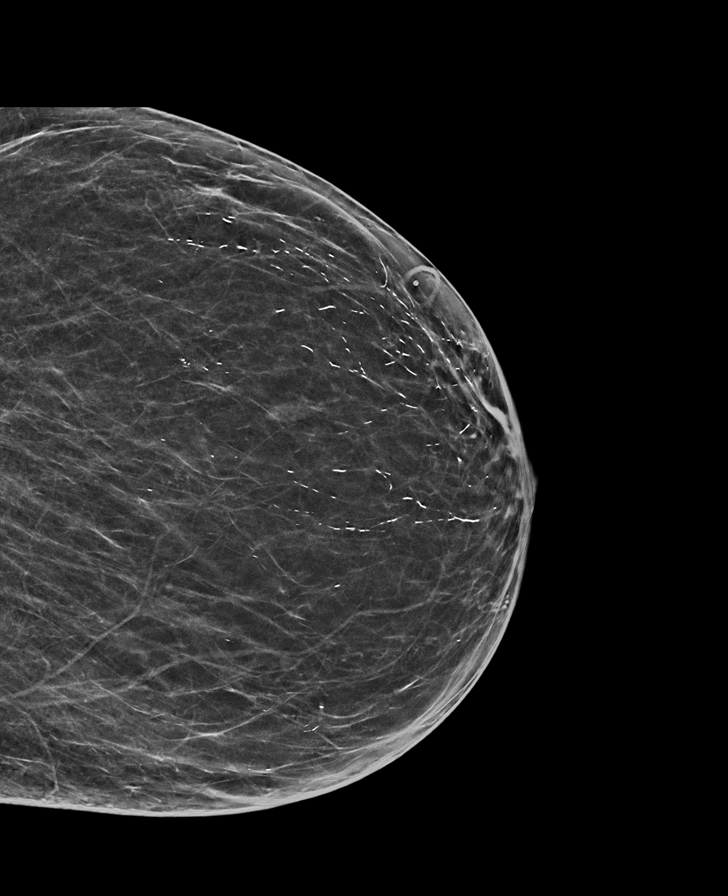

[R MLO synth-2D]
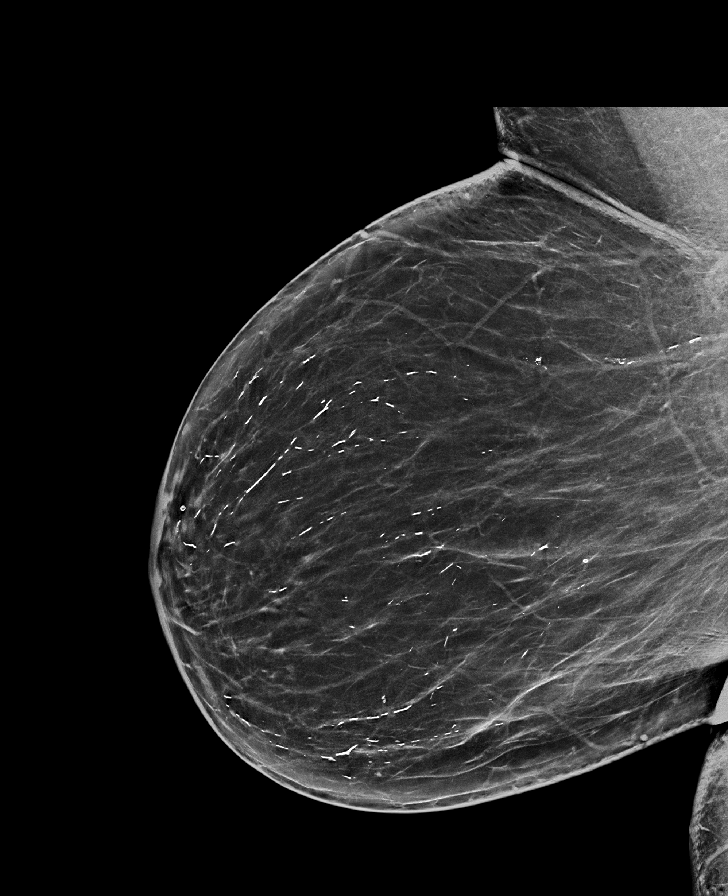

[R CC]
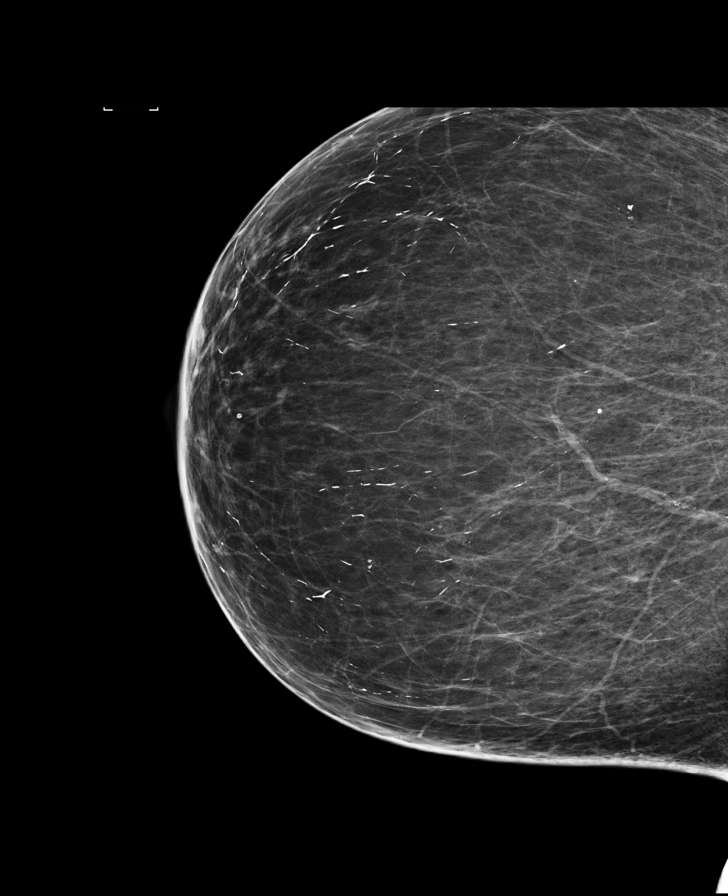

[R CC synth-2D]
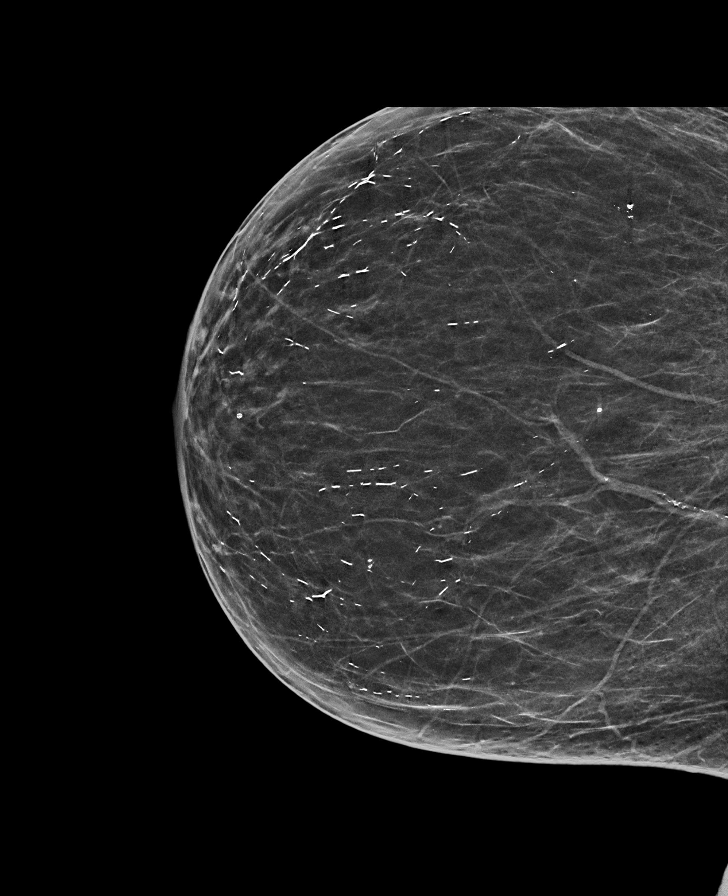

[R MLO]
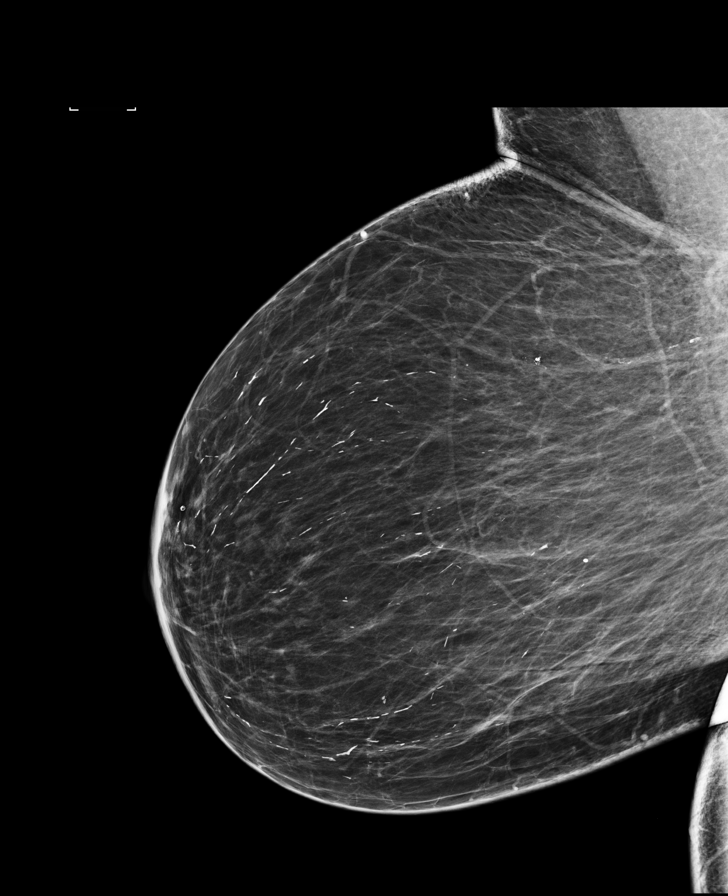

[L MLO]
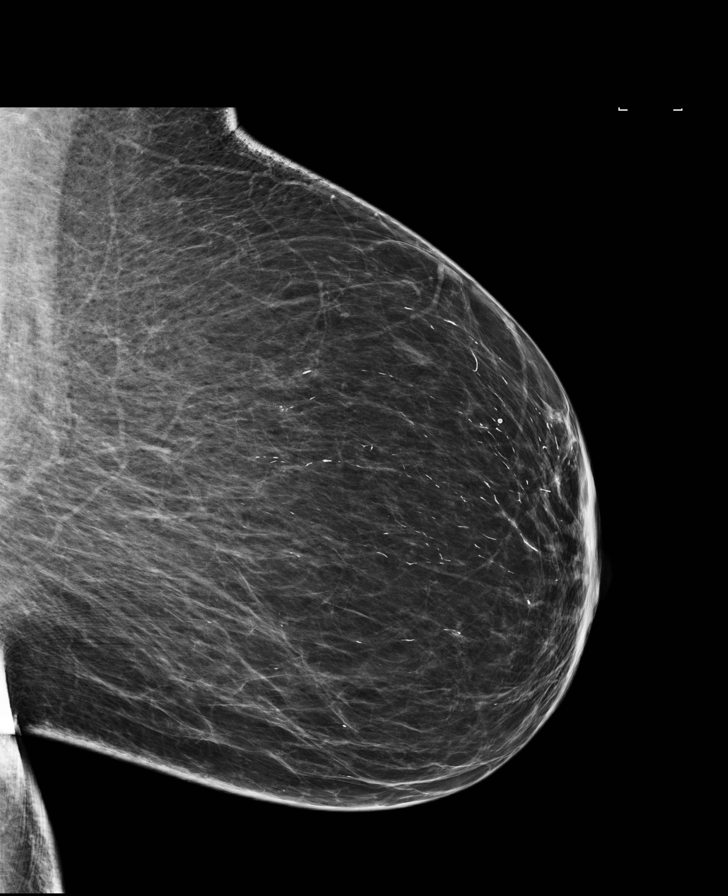

[8 of 28 positions shown; findings below may reference images not displayed]

ACR Breast Density Category b: There are scattered areas of
fibroglandular density.
FINDINGS: There are no findings suspicious for malignancy. Images were
processed with CAD.
IMPRESSION: No mammographic evidence of malignancy. A result letter of this
screening mammogram will be mailed directly to the patient.

RECOMMENDATION:
Screening mammogram in one year. (Code:[33])

BI-RADS CATEGORY  1: Negative.

## 2017-04-28 ENCOUNTER — Ambulatory Visit
Admission: RE | Admit: 2017-04-28 | Discharge: 2017-04-28 | Disposition: A | Discharge status: Home / Self Care | Payer: Medicare Other | Source: Ambulatory Visit | Attending: Cardiovascular Disease | Admitting: Cardiovascular Disease

## 2017-04-28 ENCOUNTER — Ambulatory Visit (INDEPENDENT_AMBULATORY_CARE_PROVIDER_SITE_OTHER): Payer: Medicare Other | Admitting: Cardiovascular Disease

## 2017-04-28 ENCOUNTER — Telehealth: Payer: Self-pay | Admitting: Cardiovascular Disease

## 2017-04-28 ENCOUNTER — Encounter: Payer: Self-pay | Admitting: Cardiovascular Disease

## 2017-04-28 ENCOUNTER — Other Ambulatory Visit
Admission: RE | Admit: 2017-04-28 | Discharge: 2017-04-28 | Disposition: A | Discharge status: Home / Self Care | Payer: Medicare Other | Source: Ambulatory Visit | Attending: Cardiovascular Disease | Admitting: Cardiovascular Disease

## 2017-04-28 VITALS — BP 170/72 | HR 72 | Ht 68.0 in | Wt 191.0 lb

## 2017-04-28 DIAGNOSIS — R0989 Other specified symptoms and signs involving the circulatory and respiratory systems: Secondary | ICD-10-CM | POA: Insufficient documentation

## 2017-04-28 DIAGNOSIS — R9439 Abnormal result of other cardiovascular function study: Secondary | ICD-10-CM | POA: Insufficient documentation

## 2017-04-28 DIAGNOSIS — R079 Chest pain, unspecified: Secondary | ICD-10-CM | POA: Insufficient documentation

## 2017-04-28 DIAGNOSIS — I209 Angina pectoris, unspecified: Secondary | ICD-10-CM

## 2017-04-28 DIAGNOSIS — Z794 Long term (current) use of insulin: Secondary | ICD-10-CM | POA: Diagnosis not present

## 2017-04-28 DIAGNOSIS — I5021 Acute systolic (congestive) heart failure: Secondary | ICD-10-CM | POA: Insufficient documentation

## 2017-04-28 DIAGNOSIS — E119 Type 2 diabetes mellitus without complications: Secondary | ICD-10-CM | POA: Diagnosis not present

## 2017-04-28 DIAGNOSIS — I5032 Chronic diastolic (congestive) heart failure: Secondary | ICD-10-CM | POA: Insufficient documentation

## 2017-04-28 DIAGNOSIS — I519 Heart disease, unspecified: Secondary | ICD-10-CM | POA: Diagnosis not present

## 2017-04-28 DIAGNOSIS — E11649 Type 2 diabetes mellitus with hypoglycemia without coma: Secondary | ICD-10-CM | POA: Insufficient documentation

## 2017-04-28 DIAGNOSIS — I200 Unstable angina: Secondary | ICD-10-CM | POA: Diagnosis not present

## 2017-04-28 LAB — BASIC METABOLIC PANEL
ANION GAP: 9 (ref 5–15)
BUN: 23 mg/dL — ABNORMAL HIGH (ref 6–20)
CALCIUM: 9.9 mg/dL (ref 8.9–10.3)
CO2: 29 mmol/L (ref 22–32)
CREATININE: 0.95 mg/dL (ref 0.44–1.00)
Chloride: 104 mmol/L (ref 101–111)
GFR calc Af Amer: >60 mL/min (ref >60)
GFR, EST NON AFRICAN AMERICAN: 58 mL/min — AB (ref >60)
Glucose, Bld: 66 mg/dL (ref 65–99)
Potassium: 3.9 mmol/L (ref 3.5–5.1)
SODIUM: 142 mmol/L (ref 135–145)

## 2017-04-28 LAB — CBC
HCT: 36.6 % (ref 35.0–47.0)
HEMOGLOBIN: 12.1 g/dL (ref 12.0–16.0)
MCH: 28.6 pg (ref 26.0–34.0)
MCHC: 33 g/dL (ref 32.0–36.0)
MCV: 86.5 fL (ref 80.0–100.0)
PLATELETS: 240 10*3/uL (ref 150–440)
RBC: 4.23 MIL/uL (ref 3.80–5.20)
RDW: 13.9 % (ref 11.5–14.5)
WBC: 5.2 10*3/uL (ref 3.6–11.0)

## 2017-04-28 LAB — PROTIME-INR
INR: 0.98
PROTHROMBIN TIME: 13 s (ref 11.4–15.2)

## 2017-04-28 NOTE — Patient Instructions (Addendum)
Medication Instructions:   No medication changes made  Labwork:  Labs today   Testing/Procedures:  We will schedule a cardiac cath with Dr. Fletcher Anon on Monday at 9:30 Am [facility] Cardiac Cath Instructions   You are scheduled for a Cardiac Cath on:__Monday, July 16__  Please arrive at 9:30 am on the day of your procedure  Please expect a call from our [place] to pre-register you  Do not eat/drink anything after midnight  Someone will need to drive you home  It is recommended someone be with you for the first 24 hours after your procedure  Wear clothes that are easy to get on/off and wear slip on shoes if possible   Medications bring a current list of all medications with you  _X_ Do not take these medications before your procedure: -Please do not take benazepril HCTZ the am of your procedure -Please do not take your insulin the am of your procedure   Day of your procedure: Arrive at the [place] entrance.  Free valet service is available.  After entering the [place] please check-in at the registration desk (1st desk on your right) to receive your armband. After receiving your armband someone will escort you to the cardiac cath/special procedures waiting area.  The usual length of stay after your procedure is about 2 to 3 hours.  This can vary.  If you have any questions, please call our office at [phone], or you may call the cardiac cath lab at [facility] directly at [place] for bruit  Follow-Up: It was a pleasure seeing you in the office today. Please call us if you have new issues that need to be addressed before your next appt.  [phone]  Your physician wants you to follow-up in: With Dr. Fletcher Anon after the cath   If you need a refill on your cardiac medications before your next appointment, please call your pharmacy.     Angiogram An angiogram is an X-ray test. It is used to look at your blood vessels. For this  test, a dye is put into the blood vessel being checked. The dye shows up on X-rays. It helps your doctor see if there is a blockage or other problem in the blood vessel. What happens before the procedure?  Follow your doctor's instructions about limiting what you eat or drink.  Ask your doctor if you may drink enough water to take any needed medicines the morning of the test.  Plan to have someone take you home after the test.  If you go home the same day as the test, plan to have someone stay with you for 24 hours. What happens during the procedure?  An IV tube will be put into one of your veins.  You will be given a medicine that makes you relax (sedative).  Your skin will be washed where the thin tube (catheter) will be put in. Hair may be removed from this area. The tube may be put into: ? Your upper leg area (groin). ? The fold of your arm, near your elbow. ? Your wrist.  You will be given a medicine that numbs the area where the tube will be inserted (local anesthetic).  The tube will be inserted into a blood vessel.  Using a type of X-ray (fluoroscopy) to see, your doctor will move the tube into the blood vessel to check it.  Dye will be put in through the tube. X-rays of your blood vessels will then be  taken. Different doctors and hospitals may do this procedure differently. What happens after the procedure?  If the test is done through the leg, you will be kept in bed lying flat for several hours. You will be told to not bend or cross your legs.  The area where the tube was inserted will be checked often.  The pulse in your feet or wrist will be checked often.  More tests or X-rays may be done. This information is not intended to replace advice given to you by your health care provider. Make sure you discuss any questions you have with your health care provider. Document Released: 12/31/2008 Document Revised: 03/11/2016 Document Reviewed: 03/07/2013 Elsevier  Interactive Patient Education  2017 [employer].

## 2017-04-28 NOTE — Progress Notes (Signed)
Cardiology Office Note  Date:  04/28/2017   ID:  Carrie Kane, DOB [birth year], MRN [id number]  PCP:  Carrie Athens, MD   Chief Complaint  Patient presents with  . other    referral from Dr Carrie Kane. Patient states she was having chest pain while doing her stress test. Meds reviewed verbally with patient.     HPI:  Carrie Kane is a pleasant 76 year old  with past medical history of Diabetes, insulin-dependent Hypertension Worsening chest pain over the past year Who presents by referral from Carrie Kane for unstable angina  She reports chest pain over the past year Symptoms sometimes at rest, often with exertion Symptoms getting worse over the past year Treadmill stress test done with primary care  Today, test was stopped prematurely secondary to worsening chest pain Chest pain lingered until she eventually got home that resolved Nitroglycerin was called in for her from primary care's office She was referred to our office for further evaluation, need for cardiac catheterization  Pain was in the middle of her chest, pressure. Denies having significant radiation   Notes from primary care indicate worsening pain in the past 2 months. Some shortness of breath on exertion  Lab work reviewed showing hemoglobin A1c 8.4 LDL 67 Normal creatinine 0.8 TSH 0.075  Blood pressure elevated on today's visit, she reports is typically better controlled at home but is elevated today as she was rushing  EKG personally reviewed by myself on todays visit Shows normal sinus rhythm rate 72 bpm left axis deviation nonspecific ST abnormality  Echocardiogram done through primary care showing ejection fraction 45%, anteroseptal wall dyskinesia normal left atrial size, calcified mitral valve, diastolic dysfunction    PMH:   has a past medical history of Diabetes ([place]) and HBP (high blood pressure).  PSH:    Past Surgical History:  Procedure Laterality Date  . ABDOMINAL HYSTERECTOMY      Current  Outpatient Prescriptions  Medication Sig Dispense Refill  . benazepril-hydrochlorthiazide (LOTENSIN HCT) 20-12.5 MG per tablet Take 1 tablet by mouth daily.    . cloNIDine (CATAPRES) 0.3 MG tablet Take 0.3 mg by mouth 2 (two) times daily.    . fluvastatin (LESCOL) 40 MG capsule Take 40 mg by mouth at bedtime.    . insulin glargine (LANTUS) 100 UNIT/ML injection Inject 45 Units into the skin at bedtime.    . insulin lispro (HUMALOG) 100 UNIT/ML injection Inject into the skin 3 (three) times daily before meals.    Marland Kitchen labetalol (NORMODYNE) 100 MG tablet Take 100 mg by mouth 2 (two) times daily.     Marland Kitchen levothyroxine (SYNTHROID, LEVOTHROID) 50 MCG tablet Take 50 mcg by mouth daily before breakfast.    . Multiple Vitamin (MULTIVITAMIN) capsule Take 1 capsule by mouth daily.     No current facility-administered medications for this visit.      Allergies:   Flexeril [cyclobenzaprine]   Social History:  The patient  reports that she has never smoked. She has never used smokeless tobacco. She reports that she does not drink alcohol or use drugs.   Family History:   family history includes Breast cancer in her maternal aunt.    Review of Systems: Review of Systems  Constitutional: Negative.   Respiratory: Negative.   Cardiovascular: Positive for chest pain.  Gastrointestinal: Negative.   Musculoskeletal: Negative.   Neurological: Negative.   Psychiatric/Behavioral: Negative.   All other systems reviewed and are negative.    PHYSICAL EXAM: VS:  BP (!) 170/72 (BP Location: Right Arm,  Patient Position: Sitting, Cuff Size: Normal)   Pulse 72   Ht 5\' 8"  (1.727 m)   Wt 191 lb (86.6 kg)   BMI 29.04 kg/m  , BMI Body mass index is 29.04 kg/m. GEN: Well nourished, well developed, in no acute distress  HEENT: normal  Neck: no JVD, + carotid bruits on the left, no masses Cardiac: RRR; no murmurs, rubs, or gallops,no edema  Respiratory:  clear to auscultation bilaterally, normal work of  breathing GI: soft, nontender, nondistended, + BS MS: no deformity or atrophy  Skin: warm and dry, no rash Neuro:  Strength and sensation are intact Psych: euthymic mood, full affect    Recent Labs: No results found for requested labs within last 8760 hours.    Lipid Panel No results found for: CHOL, HDL, LDLCALC, TRIG    Wt Readings from Last 3 Encounters:  04/28/17 191 lb (86.6 kg)  05/07/16 197 lb (89.4 kg)  12/19/15 198 lb (89.8 kg)       ASSESSMENT AND PLAN:    Abnormal stress test - Plan: EKG 25-OIBB, Basic Metabolic Panel (BMET), CBC, INR/PT, DG Chest 2 View Results reviewed with her Chest pain that was persistent, only relieved after some period of time when she got home later Cardiac catheterization ordered as below  Bruit - Plan: VAS US CAROTID, DG Chest 2 View Carotid bruit appreciated on the left, we will order carotid ultrasound at her convenience  Diabetes mellitus type 2, insulin dependent ([place]) We have encouraged continued exercise, careful diet management in an effort to lose weight.  Unstable angina (HCC) Worsening symptoms over the past year particularly in the past 2 months Mildly depressed ejection fraction on echocardiogram, EF 45% Chest pain on stress testing,  Risk factors for coronary disease including poorly controlled diabetes, hyperlipidemia Discussed various treatment options with her, would recommend cardiac catheterization given anginal symptoms with stress test  I have reviewed the risks, indications, and alternatives to cardiac catheterization, possible angioplasty, and stenting with the patient. Risks include but are not limited to bleeding, infection, vascular injury, stroke, myocardial infection, arrhythmia, kidney injury, radiation-related injury in the case of prolonged fluoroscopy use, emergency cardiac surgery, and death. The patient understands the risks of serious complication is 1-2 in 0488 with diagnostic cardiac cath and 1-2%  or less with angioplasty/stenting.  She'll be scheduled for Monday, July 16 with Dr. Fletcher Anon Chest x-ray ordered today, labs ordered today  Systolic dysfunction Ejection fraction 45% with anteroseptal wall dyskinesia based on outside echocardiogram   Total encounter time more than 60 minutes  Greater than 50% was spent in counseling and coordination of care with the patient   Disposition:   F/U  after catheterization    Orders Placed This Encounter  Procedures  . DG Chest 2 View  . Basic Metabolic Panel (BMET)  . CBC  . INR/PT  . EKG 12-Lead     Signed, Esmond Plants, M.D., Ph.D. 04/28/2017  [place], [place]

## 2017-04-28 NOTE — Telephone Encounter (Signed)
Dr Joesph July office needing patient to be seen by Dr Fletcher Anon They did an in office stress test and he thinks she may need a cath.  She is needing to be seen asap   Please advise.      Update: she is coming today at 63 to see Dr Rockey Situ

## 2017-04-29 ENCOUNTER — Ambulatory Visit
Admission: RE | Admit: 2017-04-29 | Discharge: 2017-04-29 | Disposition: A | Discharge status: Home / Self Care | Payer: Medicare Other | Source: Ambulatory Visit | Attending: Cardiovascular Disease | Admitting: Cardiovascular Disease

## 2017-04-29 DIAGNOSIS — I209 Angina pectoris, unspecified: Secondary | ICD-10-CM | POA: Insufficient documentation

## 2017-04-29 DIAGNOSIS — R079 Chest pain, unspecified: Secondary | ICD-10-CM | POA: Insufficient documentation

## 2017-04-29 DIAGNOSIS — R9439 Abnormal result of other cardiovascular function study: Secondary | ICD-10-CM | POA: Insufficient documentation

## 2017-04-29 DIAGNOSIS — R0989 Other specified symptoms and signs involving the circulatory and respiratory systems: Secondary | ICD-10-CM | POA: Insufficient documentation

## 2017-04-29 IMAGING — CR DG CHEST 2V
1 series · 2 of 2 positions shown · non-contrast
Comparison: None.

CLINICAL DATA: Angina pectoris. Abnormal stress test. Pre-op
respiratory exam

EXAM:
CHEST  2 VIEW

[Series 1: dg chest 2 view · 0.14mm/px · 2 of 2 slices shown]
[im 1/2]
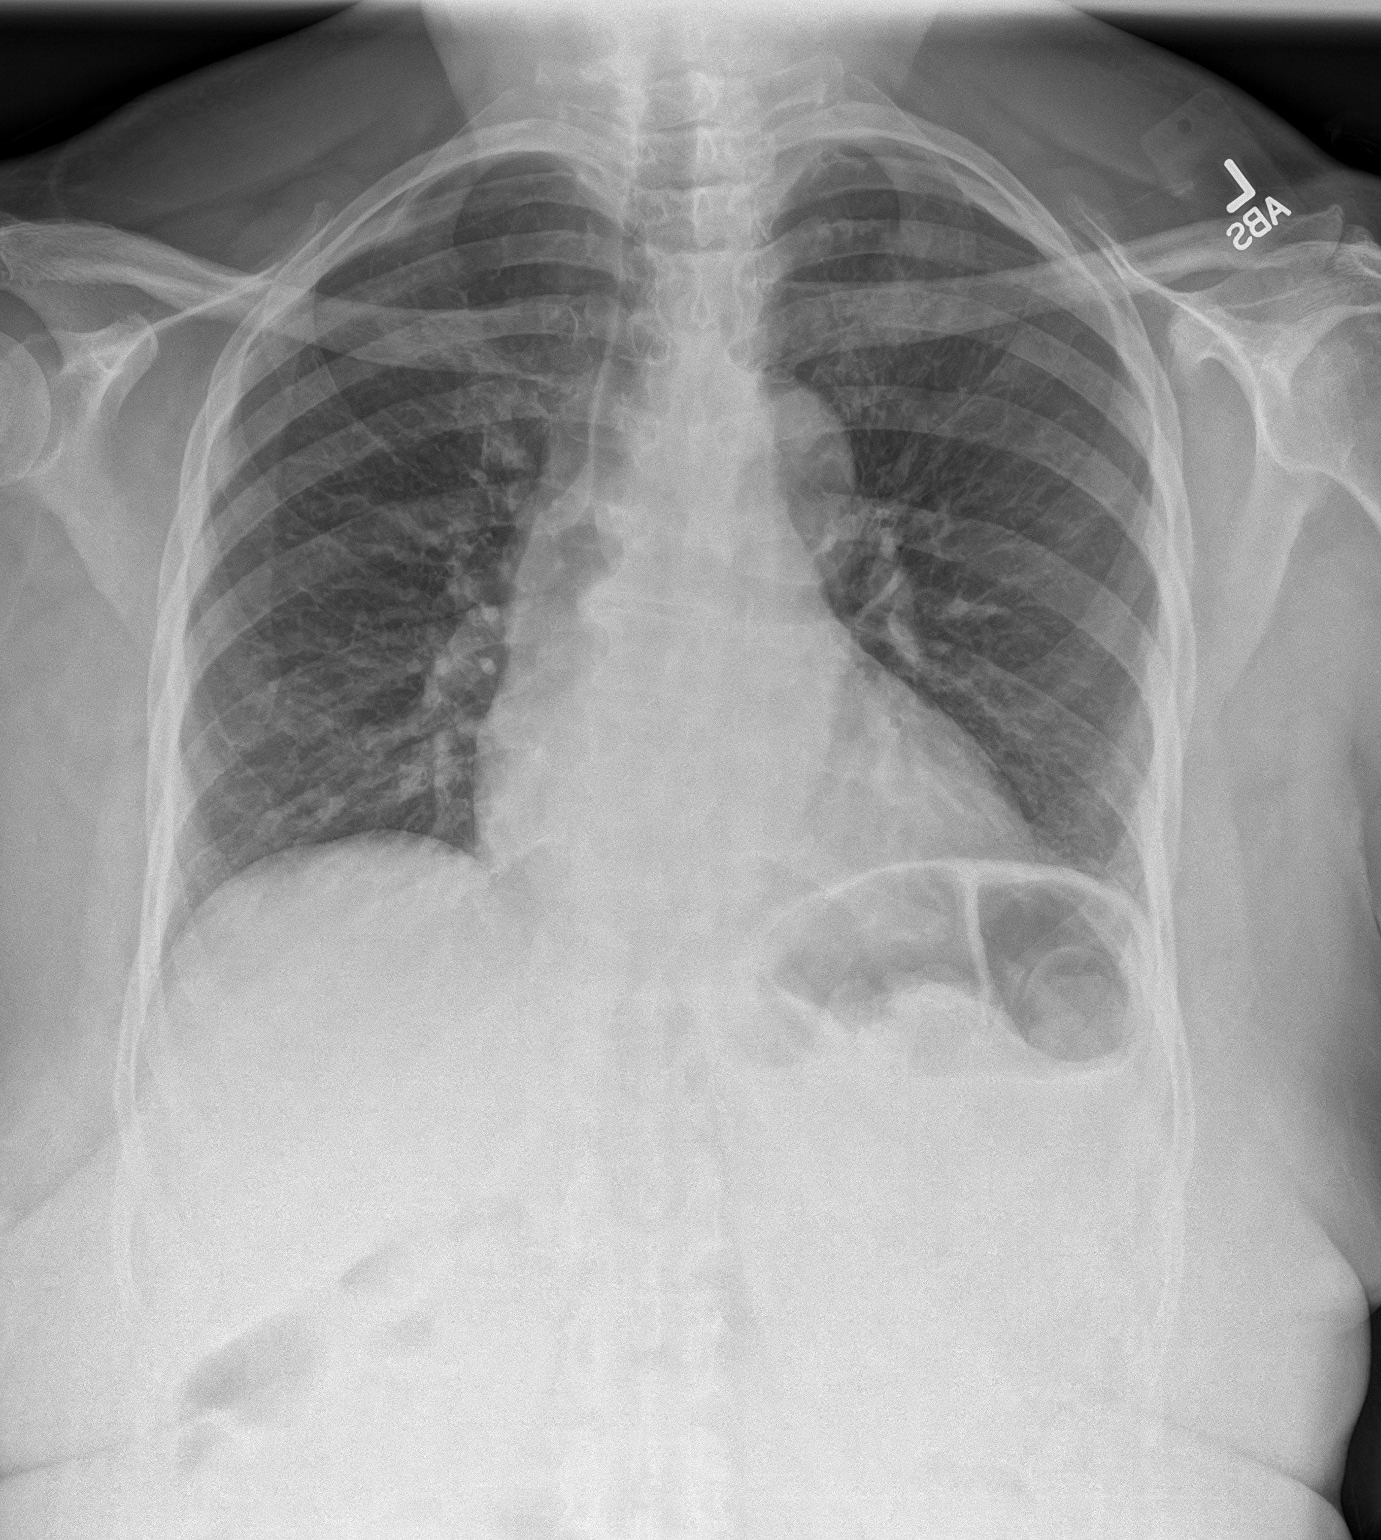
[im 2/2]
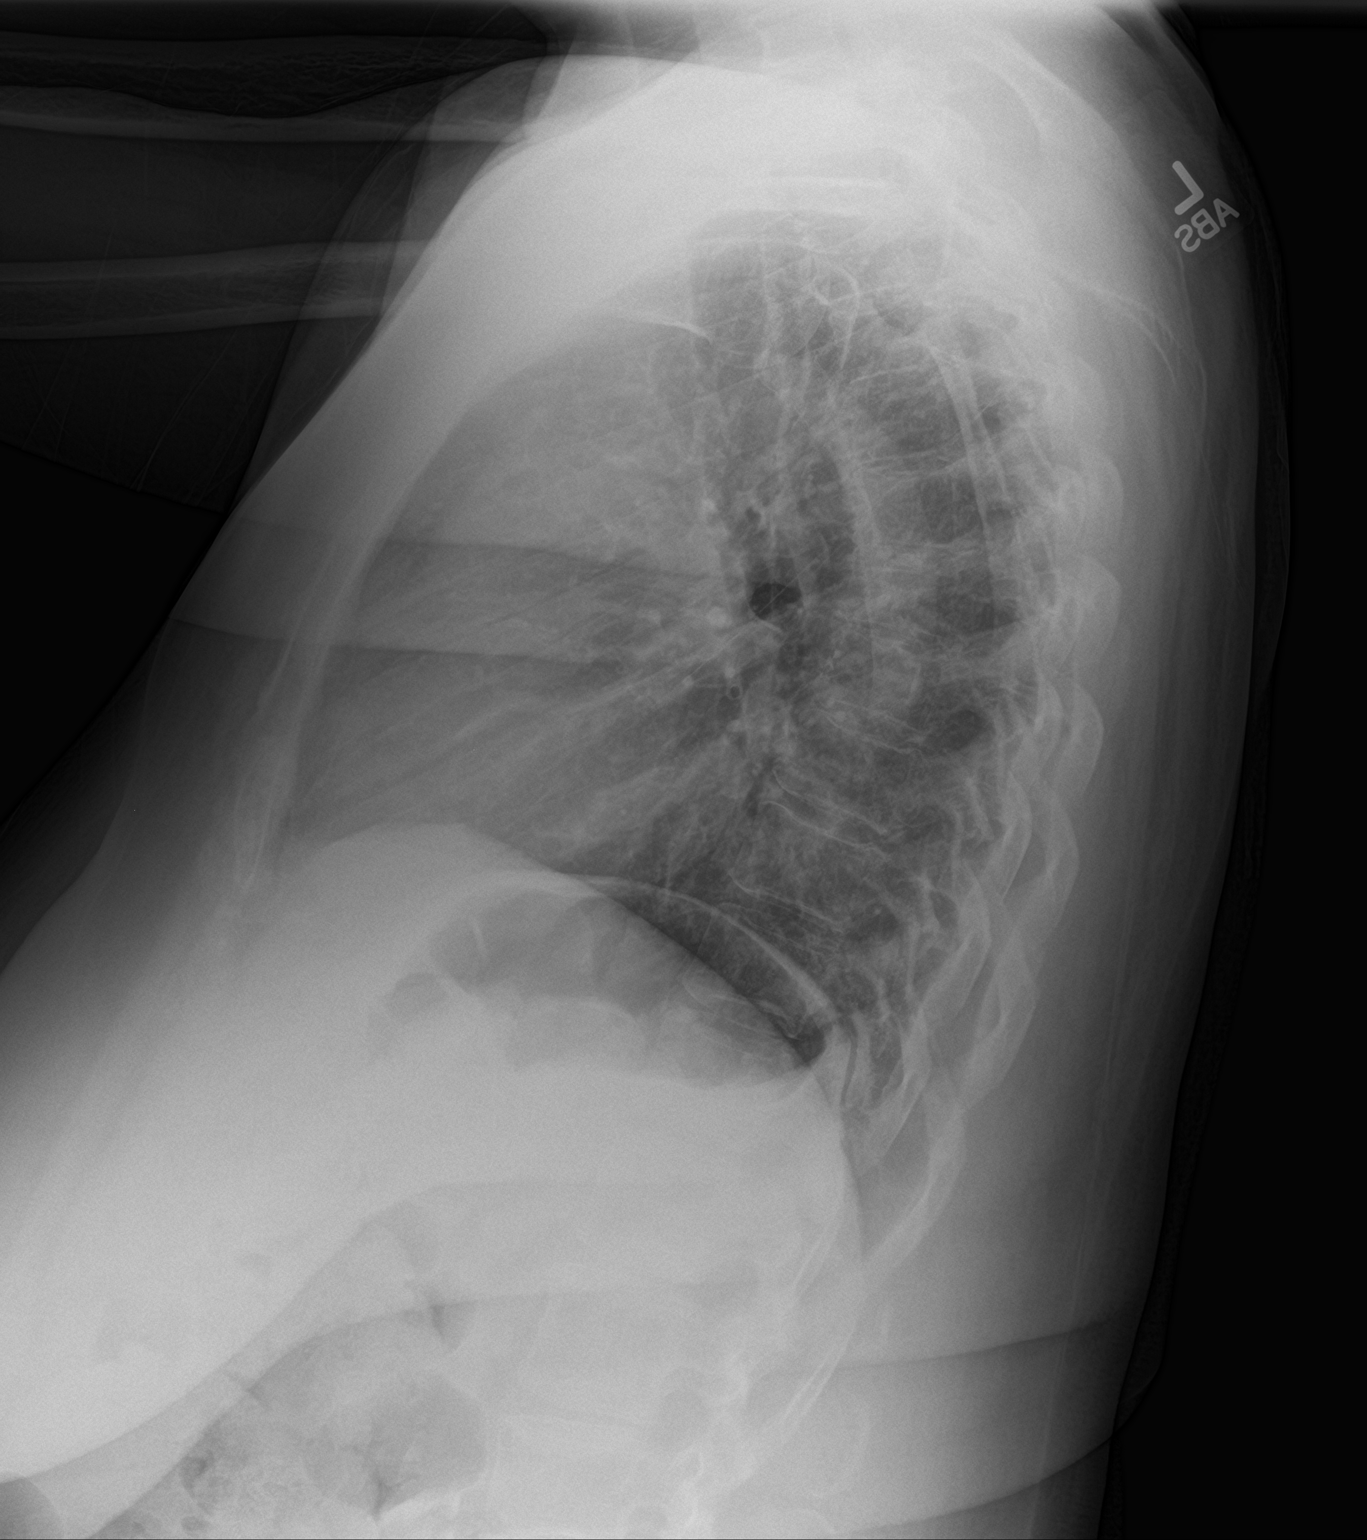

[2 of 2 positions shown; findings below may reference images not displayed]

FINDINGS: The heart size and mediastinal contours are within normal limits.
Both lungs are clear. The visualized skeletal structures are
unremarkable.
IMPRESSION: No active cardiopulmonary disease.

## 2017-05-02 ENCOUNTER — Inpatient Hospital Stay
Admission: AD | Admit: 2017-05-02 | Discharge: 2017-05-02 | DRG: 287 | Disposition: A | Discharge status: Other Acute Inpatient Hospital | Payer: Medicare Other | Source: Ambulatory Visit | Attending: Cardiovascular Disease | Admitting: Cardiovascular Disease

## 2017-05-02 ENCOUNTER — Other Ambulatory Visit: Payer: Self-pay | Admitting: Cardiovascular Disease

## 2017-05-02 ENCOUNTER — Inpatient Hospital Stay (HOSPITAL_COMMUNITY)
Admission: AD | Admit: 2017-05-02 | Discharge: 2017-05-15 | DRG: 236 | Disposition: A | Discharge status: Home Health | Payer: Medicare Other | Source: Other Acute Inpatient Hospital | Attending: Cardiothoracic Surgery | Admitting: Cardiothoracic Surgery

## 2017-05-02 ENCOUNTER — Other Ambulatory Visit: Payer: Self-pay | Admitting: Nurse Practitioner

## 2017-05-02 ENCOUNTER — Encounter
Admission: AD | Disposition: A | Discharge status: Other Acute Inpatient Hospital | Payer: Self-pay | Source: Ambulatory Visit | Attending: Cardiovascular Disease

## 2017-05-02 DIAGNOSIS — Z888 Allergy status to other drugs, medicaments and biological substances status: Secondary | ICD-10-CM

## 2017-05-02 DIAGNOSIS — Z79890 Hormone replacement therapy (postmenopausal): Secondary | ICD-10-CM | POA: Diagnosis not present

## 2017-05-02 DIAGNOSIS — I200 Unstable angina: Secondary | ICD-10-CM | POA: Diagnosis not present

## 2017-05-02 DIAGNOSIS — Z794 Long term (current) use of insulin: Secondary | ICD-10-CM | POA: Diagnosis not present

## 2017-05-02 DIAGNOSIS — E785 Hyperlipidemia, unspecified: Secondary | ICD-10-CM | POA: Diagnosis present

## 2017-05-02 DIAGNOSIS — D62 Acute posthemorrhagic anemia: Secondary | ICD-10-CM | POA: Diagnosis not present

## 2017-05-02 DIAGNOSIS — Z79899 Other long term (current) drug therapy: Secondary | ICD-10-CM | POA: Diagnosis not present

## 2017-05-02 DIAGNOSIS — I509 Heart failure, unspecified: Secondary | ICD-10-CM | POA: Diagnosis present

## 2017-05-02 DIAGNOSIS — E1159 Type 2 diabetes mellitus with other circulatory complications: Secondary | ICD-10-CM | POA: Diagnosis present

## 2017-05-02 DIAGNOSIS — G249 Dystonia, unspecified: Secondary | ICD-10-CM | POA: Diagnosis present

## 2017-05-02 DIAGNOSIS — E039 Hypothyroidism, unspecified: Secondary | ICD-10-CM | POA: Diagnosis present

## 2017-05-02 DIAGNOSIS — E8770 Fluid overload, unspecified: Secondary | ICD-10-CM | POA: Diagnosis not present

## 2017-05-02 DIAGNOSIS — E7800 Pure hypercholesterolemia, unspecified: Secondary | ICD-10-CM | POA: Diagnosis not present

## 2017-05-02 DIAGNOSIS — I110 Hypertensive heart disease with heart failure: Secondary | ICD-10-CM | POA: Diagnosis present

## 2017-05-02 DIAGNOSIS — I2510 Atherosclerotic heart disease of native coronary artery without angina pectoris: Secondary | ICD-10-CM | POA: Diagnosis not present

## 2017-05-02 DIAGNOSIS — I25110 Atherosclerotic heart disease of native coronary artery with unstable angina pectoris: Secondary | ICD-10-CM | POA: Diagnosis not present

## 2017-05-02 DIAGNOSIS — R079 Chest pain, unspecified: Secondary | ICD-10-CM | POA: Diagnosis present

## 2017-05-02 DIAGNOSIS — E669 Obesity, unspecified: Secondary | ICD-10-CM | POA: Diagnosis present

## 2017-05-02 DIAGNOSIS — Z6831 Body mass index (BMI) 31.0-31.9, adult: Secondary | ICD-10-CM | POA: Diagnosis not present

## 2017-05-02 DIAGNOSIS — I2584 Coronary atherosclerosis due to calcified coronary lesion: Secondary | ICD-10-CM | POA: Diagnosis present

## 2017-05-02 DIAGNOSIS — E1165 Type 2 diabetes mellitus with hyperglycemia: Secondary | ICD-10-CM | POA: Diagnosis not present

## 2017-05-02 DIAGNOSIS — I519 Heart disease, unspecified: Secondary | ICD-10-CM | POA: Diagnosis present

## 2017-05-02 DIAGNOSIS — R0989 Other specified symptoms and signs involving the circulatory and respiratory systems: Secondary | ICD-10-CM | POA: Diagnosis present

## 2017-05-02 DIAGNOSIS — I214 Non-ST elevation (NSTEMI) myocardial infarction: Secondary | ICD-10-CM | POA: Diagnosis present

## 2017-05-02 DIAGNOSIS — Z01810 Encounter for preprocedural cardiovascular examination: Secondary | ICD-10-CM | POA: Diagnosis not present

## 2017-05-02 DIAGNOSIS — Z951 Presence of aortocoronary bypass graft: Secondary | ICD-10-CM

## 2017-05-02 DIAGNOSIS — Z90710 Acquired absence of both cervix and uterus: Secondary | ICD-10-CM

## 2017-05-02 DIAGNOSIS — E11649 Type 2 diabetes mellitus with hypoglycemia without coma: Secondary | ICD-10-CM | POA: Diagnosis not present

## 2017-05-02 DIAGNOSIS — I10 Essential (primary) hypertension: Secondary | ICD-10-CM | POA: Diagnosis not present

## 2017-05-02 DIAGNOSIS — E119 Type 2 diabetes mellitus without complications: Secondary | ICD-10-CM | POA: Diagnosis present

## 2017-05-02 DIAGNOSIS — K5900 Constipation, unspecified: Secondary | ICD-10-CM | POA: Diagnosis not present

## 2017-05-02 DIAGNOSIS — I5041 Acute combined systolic (congestive) and diastolic (congestive) heart failure: Secondary | ICD-10-CM | POA: Diagnosis not present

## 2017-05-02 DIAGNOSIS — I119 Hypertensive heart disease without heart failure: Secondary | ICD-10-CM | POA: Diagnosis present

## 2017-05-02 DIAGNOSIS — Z803 Family history of malignant neoplasm of breast: Secondary | ICD-10-CM

## 2017-05-02 HISTORY — PX: LEFT HEART CATH AND CORONARY ANGIOGRAPHY: CATH118249

## 2017-05-02 HISTORY — DX: Atherosclerotic heart disease of native coronary artery without angina pectoris: I25.10

## 2017-05-02 LAB — GLUCOSE, CAPILLARY
GLUCOSE-CAPILLARY: 414 mg/dL — AB (ref 65–99)
Glucose-Capillary: 405 mg/dL — ABNORMAL HIGH (ref 65–99)
Glucose-Capillary: 369 mg/dL — ABNORMAL HIGH (ref 65–99)
Glucose-Capillary: 151 mg/dL — ABNORMAL HIGH (ref 65–99)

## 2017-05-02 SURGERY — LEFT HEART CATH AND CORONARY ANGIOGRAPHY
Anesthesia: Moderate Sedation

## 2017-05-02 SURGERY — LEFT HEART CATH
Anesthesia: Moderate Sedation

## 2017-05-02 MED ORDER — ATORVASTATIN CALCIUM 20 MG PO TABS: 80.0000 mg | ORAL_TABLET | Freq: Every day | ORAL | Status: DC

## 2017-05-02 MED ORDER — INSULIN ASPART 100 UNIT/ML ~~LOC~~ SOLN
10.0000 [IU] | Freq: Once | SUBCUTANEOUS | Status: AC
  Administered 2017-05-02: 10 [IU] via SUBCUTANEOUS

## 2017-05-02 MED ORDER — MIDAZOLAM HCL 2 MG/2ML IJ SOLN
INTRAMUSCULAR | Status: DC | PRN
  Administered 2017-05-02: 1 mg via INTRAVENOUS

## 2017-05-02 MED ORDER — ATORVASTATIN CALCIUM 40 MG PO TABS
40.0000 mg | ORAL_TABLET | Freq: Every day | ORAL | Status: DC
  Administered 2017-05-02 – 2017-05-14 (×12): 40 mg via ORAL
  Filled 2017-05-02 (×13): qty 1

## 2017-05-02 MED ORDER — INSULIN REGULAR HUMAN 100 UNIT/ML IJ SOLN: 10.0000 [IU] | Freq: Once | INTRAMUSCULAR | Status: DC

## 2017-05-02 MED ORDER — BENAZEPRIL HCL 20 MG PO TABS
20.0000 mg | ORAL_TABLET | Freq: Two times a day (BID) | ORAL | Status: DC
  Administered 2017-05-02: 20 mg via ORAL
  Filled 2017-05-02 (×3): qty 1

## 2017-05-02 MED ORDER — INSULIN GLARGINE 100 UNIT/ML ~~LOC~~ SOLN
45.0000 [IU] | Freq: Every day | SUBCUTANEOUS | Status: DC
  Filled 2017-05-02: qty 0.45

## 2017-05-02 MED ORDER — FENTANYL CITRATE (PF) 100 MCG/2ML IJ SOLN
INTRAMUSCULAR | Status: AC
  Filled 2017-05-02: qty 2

## 2017-05-02 MED ORDER — LABETALOL HCL 5 MG/ML IV SOLN
INTRAVENOUS | Status: AC
  Filled 2017-05-02: qty 4

## 2017-05-02 MED ORDER — INSULIN ASPART 100 UNIT/ML ~~LOC~~ SOLN
SUBCUTANEOUS | Status: AC
  Filled 2017-05-02: qty 1

## 2017-05-02 MED ORDER — HEPARIN (PORCINE) IN NACL 100-0.45 UNIT/ML-% IJ SOLN
950.0000 [IU]/h | INTRAMUSCULAR | Status: DC
  Administered 2017-05-02 – 2017-05-08 (×5): 950 [IU]/h via INTRAVENOUS
  Filled 2017-05-02 (×6): qty 250

## 2017-05-02 MED ORDER — SODIUM CHLORIDE 0.9% FLUSH: 3.0000 mL | Freq: Two times a day (BID) | INTRAVENOUS | Status: DC

## 2017-05-02 MED ORDER — CLONIDINE HCL 0.2 MG PO TABS
0.3000 mg | ORAL_TABLET | Freq: Two times a day (BID) | ORAL | Status: DC
  Administered 2017-05-02 – 2017-05-08 (×13): 0.3 mg via ORAL
  Filled 2017-05-02 (×13): qty 1

## 2017-05-02 MED ORDER — ALUM & MAG HYDROXIDE-SIMETH 200-200-20 MG/5ML PO SUSP: 15.0000 mL | Freq: Four times a day (QID) | ORAL | Status: DC | PRN

## 2017-05-02 MED ORDER — VITAMIN D3 25 MCG (1000 UNIT) PO TABS
1000.0000 [IU] | ORAL_TABLET | Freq: Every day | ORAL | Status: DC
  Filled 2017-05-02: qty 1

## 2017-05-02 MED ORDER — LEVOTHYROXINE SODIUM 100 MCG PO TABS: 100.0000 ug | ORAL_TABLET | Freq: Every day | ORAL | Status: DC

## 2017-05-02 MED ORDER — ADULT MULTIVITAMIN W/MINERALS CH: 1.0000 | ORAL_TABLET | Freq: Every day | ORAL | Status: DC

## 2017-05-02 MED ORDER — SODIUM CHLORIDE 0.9% FLUSH
3.0000 mL | Freq: Two times a day (BID) | INTRAVENOUS | Status: DC
  Administered 2017-05-02 – 2017-05-07 (×9): 3 mL via INTRAVENOUS

## 2017-05-02 MED ORDER — ASPIRIN 81 MG PO CHEW: 81.0000 mg | CHEWABLE_TABLET | ORAL | Status: DC

## 2017-05-02 MED ORDER — IOPAMIDOL (ISOVUE-300) INJECTION 61%
INTRAVENOUS | Status: DC | PRN
  Administered 2017-05-02: 110 mL via INTRA_ARTERIAL

## 2017-05-02 MED ORDER — INSULIN ASPART 100 UNIT/ML ~~LOC~~ SOLN: 0.0000 [IU] | Freq: Three times a day (TID) | SUBCUTANEOUS | Status: DC

## 2017-05-02 MED ORDER — MIDAZOLAM HCL 2 MG/2ML IJ SOLN
INTRAMUSCULAR | Status: AC
  Filled 2017-05-02: qty 2

## 2017-05-02 MED ORDER — VERAPAMIL HCL 2.5 MG/ML IV SOLN
INTRAVENOUS | Status: AC
  Filled 2017-05-02: qty 2

## 2017-05-02 MED ORDER — SODIUM CHLORIDE 0.9 % IV SOLN: INTRAVENOUS | Status: DC

## 2017-05-02 MED ORDER — SODIUM CHLORIDE 0.9 % IV SOLN: 250.0000 mL | INTRAVENOUS | Status: DC | PRN

## 2017-05-02 MED ORDER — CARVEDILOL 6.25 MG PO TABS
6.2500 mg | ORAL_TABLET | Freq: Two times a day (BID) | ORAL | Status: DC
  Administered 2017-05-02: 6.25 mg via ORAL
  Filled 2017-05-02: qty 1

## 2017-05-02 MED ORDER — BENAZEPRIL-HYDROCHLOROTHIAZIDE 20-12.5 MG PO TABS: 1.0000 | ORAL_TABLET | Freq: Two times a day (BID) | ORAL | Status: DC

## 2017-05-02 MED ORDER — BENAZEPRIL HCL 20 MG PO TABS
20.0000 mg | ORAL_TABLET | Freq: Every day | ORAL | Status: DC
  Administered 2017-05-02 – 2017-05-08 (×7): 20 mg via ORAL
  Filled 2017-05-02 (×7): qty 2

## 2017-05-02 MED ORDER — INSULIN GLARGINE 100 UNIT/ML ~~LOC~~ SOLN: 45.0000 [IU] | Freq: Every day | SUBCUTANEOUS | Status: DC

## 2017-05-02 MED ORDER — NITROGLYCERIN 0.4 MG SL SUBL
0.4000 mg | SUBLINGUAL_TABLET | SUBLINGUAL | Status: DC | PRN
  Administered 2017-05-03 – 2017-05-09 (×4): 0.4 mg via SUBLINGUAL
  Filled 2017-05-02 (×4): qty 1

## 2017-05-02 MED ORDER — FENTANYL CITRATE (PF) 100 MCG/2ML IJ SOLN
INTRAMUSCULAR | Status: DC | PRN
  Administered 2017-05-02: 25 ug via INTRAVENOUS

## 2017-05-02 MED ORDER — ONDANSETRON HCL 4 MG/2ML IJ SOLN: 4.0000 mg | Freq: Four times a day (QID) | INTRAMUSCULAR | Status: DC | PRN

## 2017-05-02 MED ORDER — ASPIRIN EC 81 MG PO TBEC
81.0000 mg | DELAYED_RELEASE_TABLET | Freq: Every day | ORAL | Status: DC
  Administered 2017-05-03 – 2017-05-08 (×6): 81 mg via ORAL
  Filled 2017-05-02 (×6): qty 1

## 2017-05-02 MED ORDER — SODIUM CHLORIDE 0.9% FLUSH: 3.0000 mL | INTRAVENOUS | Status: DC | PRN

## 2017-05-02 MED ORDER — NITROGLYCERIN 0.4 MG SL SUBL: 0.4000 mg | SUBLINGUAL_TABLET | SUBLINGUAL | Status: DC | PRN

## 2017-05-02 MED ORDER — ACETAMINOPHEN 325 MG PO TABS: 650.0000 mg | ORAL_TABLET | ORAL | Status: DC | PRN

## 2017-05-02 MED ORDER — INSULIN ASPART 100 UNIT/ML ~~LOC~~ SOLN
0.0000 [IU] | Freq: Three times a day (TID) | SUBCUTANEOUS | Status: DC
  Administered 2017-05-02: 3 [IU] via SUBCUTANEOUS
  Administered 2017-05-03: 5 [IU] via SUBCUTANEOUS
  Administered 2017-05-03: 8 [IU] via SUBCUTANEOUS
  Administered 2017-05-03: 2 [IU] via SUBCUTANEOUS
  Administered 2017-05-04: 11 [IU] via SUBCUTANEOUS
  Administered 2017-05-04: 2 [IU] via SUBCUTANEOUS
  Administered 2017-05-04: 3 [IU] via SUBCUTANEOUS
  Administered 2017-05-05: 2 [IU] via SUBCUTANEOUS

## 2017-05-02 MED ORDER — HEPARIN (PORCINE) IN NACL 2-0.9 UNIT/ML-% IJ SOLN
INTRAMUSCULAR | Status: AC
  Filled 2017-05-02: qty 500

## 2017-05-02 MED ORDER — INSULIN GLARGINE 100 UNIT/ML ~~LOC~~ SOLN
25.0000 [IU] | Freq: Two times a day (BID) | SUBCUTANEOUS | Status: DC
  Administered 2017-05-02 – 2017-05-07 (×8): 25 [IU] via SUBCUTANEOUS
  Filled 2017-05-02 (×11): qty 0.25

## 2017-05-02 MED ORDER — ASPIRIN 81 MG PO CHEW: 81.0000 mg | CHEWABLE_TABLET | Freq: Every day | ORAL | Status: DC

## 2017-05-02 MED ORDER — LABETALOL HCL 5 MG/ML IV SOLN
INTRAVENOUS | Status: DC | PRN
  Administered 2017-05-02 (×2): 20 mg via INTRAVENOUS

## 2017-05-02 MED ORDER — CARVEDILOL 6.25 MG PO TABS
6.2500 mg | ORAL_TABLET | Freq: Two times a day (BID) | ORAL | Status: DC
Start: 2017-05-02 — End: 2017-05-09
  Administered 2017-05-02 – 2017-05-08 (×13): 6.25 mg via ORAL
  Filled 2017-05-02 (×13): qty 1

## 2017-05-02 MED ORDER — LEVOTHYROXINE SODIUM 100 MCG PO TABS
100.0000 ug | ORAL_TABLET | Freq: Every day | ORAL | Status: DC
  Administered 2017-05-03 – 2017-05-15 (×12): 100 ug via ORAL
  Filled 2017-05-02 (×13): qty 1

## 2017-05-02 MED ORDER — METOCLOPRAMIDE HCL 5 MG/ML IJ SOLN: 10.0000 mg | Freq: Four times a day (QID) | INTRAMUSCULAR | Status: DC

## 2017-05-02 MED ORDER — CLONIDINE HCL 0.1 MG PO TABS
ORAL_TABLET | ORAL | Status: AC
  Administered 2017-05-02: 0.3 mg via ORAL
  Filled 2017-05-02: qty 3

## 2017-05-02 MED ORDER — HEPARIN SODIUM (PORCINE) 1000 UNIT/ML IJ SOLN
INTRAMUSCULAR | Status: AC
  Filled 2017-05-02: qty 1

## 2017-05-02 MED ORDER — HYDROCHLOROTHIAZIDE 12.5 MG PO CAPS
12.5000 mg | ORAL_CAPSULE | Freq: Two times a day (BID) | ORAL | Status: DC
  Administered 2017-05-02: 12.5 mg via ORAL
  Filled 2017-05-02 (×3): qty 1

## 2017-05-02 MED ORDER — CLONIDINE HCL 0.1 MG PO TABS
0.3000 mg | ORAL_TABLET | Freq: Two times a day (BID) | ORAL | Status: DC
  Administered 2017-05-02: 0.3 mg via ORAL

## 2017-05-02 MED ORDER — INSULIN ASPART 100 UNIT/ML ~~LOC~~ SOLN
0.0000 [IU] | Freq: Three times a day (TID) | SUBCUTANEOUS | Status: DC
  Administered 2017-05-02: 15 [IU] via SUBCUTANEOUS

## 2017-05-02 SURGICAL SUPPLY — 12 items
CATH 5FR JL4 DIAGNOSTIC (CATHETERS) ×4 IMPLANT
CATH INFINITI 5FR ANG PIGTAIL (CATHETERS) ×4 IMPLANT
CATH INFINITI 5FR JL5 (CATHETERS) ×4 IMPLANT
CATH INFINITI JR4 5F (CATHETERS) ×4 IMPLANT
DEVICE CLOSURE MYNXGRIP 5F (Vascular Products) ×4 IMPLANT
GLIDESHEATH SLEND SS 6F .021 (SHEATH) IMPLANT
KIT MANI 3VAL PERCEP (MISCELLANEOUS) ×4 IMPLANT
NEEDLE PERC 18GX7CM (NEEDLE) ×4 IMPLANT
PACK CARDIAC CATH (CUSTOM PROCEDURE TRAY) ×4 IMPLANT
SHEATH AVANTI 5FR X 11CM (SHEATH) ×4 IMPLANT
WIRE EMERALD 3MM-J .035X150CM (WIRE) ×4 IMPLANT
WIRE ROSEN-J .035X260CM (WIRE) IMPLANT

## 2017-05-02 NOTE — H&P (View-Only) (Signed)
Cardiology Office Note  Date:  04/28/2017   ID:  Carrie Kane, DOB [birth year], MRN [id number]  PCP:  Cletis Athens, MD   Chief Complaint  Patient presents with  . other    referral from Dr Lavera Guise. Patient states she was having chest pain while doing her stress test. Meds reviewed verbally with patient.     HPI:  [name] is a pleasant 76 year old  with past medical history of Diabetes, insulin-dependent Hypertension Worsening chest pain over the past year Who presents by referral from [facility] for unstable angina  She reports chest pain over the past year Symptoms sometimes at rest, often with exertion Symptoms getting worse over the past year Treadmill stress test done with primary care  Today, test was stopped prematurely secondary to worsening chest pain Chest pain lingered until she eventually got home that resolved Nitroglycerin was called in for her from primary care's office She was referred to our office for further evaluation, need for cardiac catheterization  Pain was in the middle of her chest, pressure. Denies having significant radiation   Notes from primary care indicate worsening pain in the past 2 months. Some shortness of breath on exertion  Lab work reviewed showing hemoglobin A1c 8.4 LDL 67 Normal creatinine 0.8 TSH 0.075  Blood pressure elevated on today's visit, she reports is typically better controlled at home but is elevated today as she was rushing  EKG personally reviewed by myself on todays visit Shows normal sinus rhythm rate 72 bpm left axis deviation nonspecific ST abnormality  Echocardiogram done through primary care showing ejection fraction 45%, anteroseptal wall dyskinesia normal left atrial size, calcified mitral valve, diastolic dysfunction    PMH:   has a past medical history of Diabetes ([place]) and HBP (high blood pressure).  PSH:    Past Surgical History:  Procedure Laterality Date  . ABDOMINAL HYSTERECTOMY      Current  Outpatient Prescriptions  Medication Sig Dispense Refill  . benazepril-hydrochlorthiazide (LOTENSIN HCT) 20-12.5 MG per tablet Take 1 tablet by mouth daily.    . cloNIDine (CATAPRES) 0.3 MG tablet Take 0.3 mg by mouth 2 (two) times daily.    . fluvastatin (LESCOL) 40 MG capsule Take 40 mg by mouth at bedtime.    . insulin glargine (LANTUS) 100 UNIT/ML injection Inject 45 Units into the skin at bedtime.    . insulin lispro (HUMALOG) 100 UNIT/ML injection Inject into the skin 3 (three) times daily before meals.    Marland Kitchen labetalol (NORMODYNE) 100 MG tablet Take 100 mg by mouth 2 (two) times daily.     Marland Kitchen levothyroxine (SYNTHROID, LEVOTHROID) 50 MCG tablet Take 50 mcg by mouth daily before breakfast.    . Multiple Vitamin (MULTIVITAMIN) capsule Take 1 capsule by mouth daily.     No current facility-administered medications for this visit.      Allergies:   Flexeril [cyclobenzaprine]   Social History:  The patient  reports that she has never smoked. She has never used smokeless tobacco. She reports that she does not drink alcohol or use drugs.   Family History:   family history includes Breast cancer in her maternal aunt.    Review of Systems: Review of Systems  Constitutional: Negative.   Respiratory: Negative.   Cardiovascular: Positive for chest pain.  Gastrointestinal: Negative.   Musculoskeletal: Negative.   Neurological: Negative.   Psychiatric/Behavioral: Negative.   All other systems reviewed and are negative.    PHYSICAL EXAM: VS:  BP (!) 170/72 (BP Location: Right Arm,  Patient Position: Sitting, Cuff Size: Normal)   Pulse 72   Ht 5\' 8"  (1.727 m)   Wt 191 lb (86.6 kg)   BMI 29.04 kg/m  , BMI Body mass index is 29.04 kg/m. GEN: Well nourished, well developed, in no acute distress  HEENT: normal  Neck: no JVD, + carotid bruits on the left, no masses Cardiac: RRR; no murmurs, rubs, or gallops,no edema  Respiratory:  clear to auscultation bilaterally, normal work of  breathing GI: soft, nontender, nondistended, + BS MS: no deformity or atrophy  Skin: warm and dry, no rash Neuro:  Strength and sensation are intact Psych: euthymic mood, full affect    Recent Labs: No results found for requested labs within last 8760 hours.    Lipid Panel No results found for: CHOL, HDL, LDLCALC, TRIG    Wt Readings from Last 3 Encounters:  04/28/17 191 lb (86.6 kg)  05/07/16 197 lb (89.4 kg)  12/19/15 198 lb (89.8 kg)       ASSESSMENT AND PLAN:    Abnormal stress test - Plan: EKG 53-MIWO, Basic Metabolic Panel (BMET), CBC, INR/PT, DG Chest 2 View Results reviewed with her Chest pain that was persistent, only relieved after some period of time when she got home later Cardiac catheterization ordered as below  Bruit - Plan: VAS US CAROTID, DG Chest 2 View Carotid bruit appreciated on the left, we will order carotid ultrasound at her convenience  Diabetes mellitus type 2, insulin dependent ([place]) We have encouraged continued exercise, careful diet management in an effort to lose weight.  Unstable angina (HCC) Worsening symptoms over the past year particularly in the past 2 months Mildly depressed ejection fraction on echocardiogram, EF 45% Chest pain on stress testing,  Risk factors for coronary disease including poorly controlled diabetes, hyperlipidemia Discussed various treatment options with her, would recommend cardiac catheterization given anginal symptoms with stress test  I have reviewed the risks, indications, and alternatives to cardiac catheterization, possible angioplasty, and stenting with the patient. Risks include but are not limited to bleeding, infection, vascular injury, stroke, myocardial infection, arrhythmia, kidney injury, radiation-related injury in the case of prolonged fluoroscopy use, emergency cardiac surgery, and death. The patient understands the risks of serious complication is 1-2 in 0321 with diagnostic cardiac cath and 1-2%  or less with angioplasty/stenting.  She'll be scheduled for Monday, July 16 with Dr. Fletcher Anon Chest x-ray ordered today, labs ordered today  Systolic dysfunction Ejection fraction 45% with anteroseptal wall dyskinesia based on outside echocardiogram   Total encounter time more than 60 minutes  Greater than 50% was spent in counseling and coordination of care with the patient   Disposition:   F/U  after catheterization    Orders Placed This Encounter  Procedures  . DG Chest 2 View  . Basic Metabolic Panel (BMET)  . CBC  . INR/PT  . EKG 12-Lead     Signed, Esmond Plants, M.D., Ph.D. 04/28/2017  [place], [place]

## 2017-05-02 NOTE — Discharge Instructions (Signed)
Groin Insertion Instructions-If you lose feeling or develop tingling or pain in your leg or foot after the procedure, please walk around first.  If the discomfort does not improve , contact your physician and proceed to the nearest emergency room.  Loss of feeling in your leg might mean that a blockage has formed in the artery and this can be appropriately treated.  Limit your activity for the next two days after your procedure.  Avoid stooping, bending, heavy lifting or exertion as this may put pressure on the insertion site.  Resume normal activities in 48 hours.  You may shower after 24 hours but avoid excessive warm water and do not scrub the site.  Remove clear dressing in 48 hours.  If you have had a closure device inserted, do not soak in a tub bath or a hot tub for at least one week. ° °No driving for 48 hours after discharge.  After the procedure, check the insertion site occasionally.  If any oozing occurs or there is apparent swelling, firm pressure over the site will prevent a bruise from forming.  You can not hurt anything by pressing directly on the site.  The pressure stops the bleeding by allowing a small clot to form.  If the bleeding continues after the pressure has been applied for more than 15 minutes, call 911 or go to the nearest emergency room.   ° °The x-ray dye causes you to pass a considerate amount of urine.  For this reason, you will be asked to drink plenty of liquids after the procedure to prevent dehydration.  You may resume you regular diet.  Avoid caffeine products.   ° °For pain at the site of your procedure, take non-aspirin medicines such as Tylenol. ° °Medications: A. Hold Metformin for 48 hours if applicable.  B. Continue taking all your present medications at home unless your doctor prescribes any changes. ° °Moderate Conscious Sedation, Adult, Care After °These instructions provide you with information about caring for yourself after your procedure. Your health care provider  may also give you more specific instructions. Your treatment has been planned according to current medical practices, but problems sometimes occur. Call your health care provider if you have any problems or questions after your procedure. °What can I expect after the procedure? °After your procedure, it is common: °· To feel sleepy for several hours. °· To feel clumsy and have poor balance for several hours. °· To have poor judgment for several hours. °· To vomit if you eat too soon. ° °Follow these instructions at home: °For at least 24 hours after the procedure: ° °· Do not: °? Participate in activities where you could fall or become injured. °? Drive. °? Use heavy machinery. °? Drink alcohol. °? Take sleeping pills or medicines that cause drowsiness. °? Make important decisions or sign legal documents. °? Take care of children on your own. °· Rest. °Eating and drinking °· Follow the diet recommended by your health care provider. °· If you vomit: °? Drink water, juice, or soup when you can drink without vomiting. °? Make sure you have little or no nausea before eating solid foods. °General instructions °· Have a responsible adult stay with you until you are awake and alert. °· Take over-the-counter and prescription medicines only as told by your health care provider. °· If you smoke, do not smoke without supervision. °· Keep all follow-up visits as told by your health care provider. This is important. °Contact a health care provider   if: °· You keep feeling nauseous or you keep vomiting. °· You feel light-headed. °· You develop a rash. °· You have a fever. °Get help right away if: °· You have trouble breathing. °This information is not intended to replace advice given to you by your health care provider. Make sure you discuss any questions you have with your health care provider. °Document Released: 07/25/2013 Document Revised: 03/08/2016 Document Reviewed: 01/24/2016 °Elsevier Interactive Patient Education © 2018  Elsevier Inc. ° °

## 2017-05-02 NOTE — Consult Note (Signed)
[address]       [place] [address]             [phone]        Carrie Kane [place] Medical Record #[id number] Date of Birth: 07/30/1941  Referring: Dr. Sophronia Simas  Primary Care: Cletis Athens, MD  Chief Complaint:  Chest pain shortness of breath  History of Present Illness:     Patient examined, coronary angiogram images personally reviewed and counseled with patient  76 year old obese diabetic nonsmoker with hypertension and recent symptoms of chest pain consistent with acute coronary syndrome. She notably developed substernal chest pain during a stress test which had to be terminated. She subsequently underwent a outpatient echo at a different institution which by report demonstrated ejection fraction 45% with anteroapical hypokinesia. Today she underwent a scheduled outpatient cardiac catheterization which demonstrated severe multivessel CAD. Ejection fraction of 45-50 percent and LVEDP was 27-28. Due to her coronary anatomy and diabetes she is felt to be candidate for surgical coronary revascularization.  The patient was transferred to this institution is now on IV heparin. She denies any chest pain during or after the procedure today. Cardiac catheterization access was via right femoral artery without complications. Current Activity/ Functional Status: The patient is retired and lives with her husband   Zubrod Score: At the time of surgery this patient's most appropriate activity status/level should be described as: []     0    Normal activity, no symptoms []     1    Restricted in physical strenuous activity but ambulatory, able to do out light work [x]     2    Ambulatory and capable of self care, unable to do work activities, up and about                 more than 50%  Of the time                            []     3    Only limited self care, in bed greater than 50% of waking hours []     4    Completely disabled, no self care, confined to bed or  chair []     5    Moribund  Past Medical History:  Diagnosis Date  . CAD (coronary artery disease)    a. 04/2017 Cath: LM 80, LAD 80p, 29m, LCX 95ost, 61m, EF 45-50%.  . Diabetes ([place])   . HBP (high blood pressure)     Past Surgical History:  Procedure Laterality Date  . ABDOMINAL HYSTERECTOMY      History  Smoking Status  . Never Smoker  Smokeless Tobacco  . Never Used    History  Alcohol Use No    Social History   Social History  . Marital status: Married    Spouse name: N/A  . Number of children: N/A  . Years of education: N/A   Occupational History  . Not on file.   Social History Main Topics  . Smoking status: Never Smoker  . Smokeless tobacco: Never Used  . Alcohol use No  . Drug use: No  . Sexual activity: Not on file   Other Topics Concern  . Not on file   Social History Narrative  . No narrative on file    Allergies  Allergen Reactions  . Flexeril [Cyclobenzaprine] Other (See Comments)    HBP    Current  Facility-Administered Medications  Medication Dose Route Frequency Provider Last Rate Last Dose  . 0.9 %  sodium chloride infusion  250 mL Intravenous PRN Rogelia Mire, NP      . acetaminophen (TYLENOL) tablet 650 mg  650 mg Oral Q4H PRN Rogelia Mire, NP      . Derrill Memo ON 05/03/2017] aspirin EC tablet 81 mg  81 mg Oral Daily Murray Hodgkins R, NP      . atorvastatin (LIPITOR) tablet 40 mg  40 mg Oral q1800 Rogelia Mire, NP   40 mg at 05/02/17 1851  . benazepril (LOTENSIN) tablet 20 mg  20 mg Oral Daily Murray Hodgkins R, NP   20 mg at 05/02/17 1851  . carvedilol (COREG) tablet 6.25 mg  6.25 mg Oral BID WC Murray Hodgkins R, NP   6.25 mg at 05/02/17 1851  . cloNIDine (CATAPRES) tablet 0.3 mg  0.3 mg Oral BID Murray Hodgkins R, NP      . heparin ADULT infusion 100 units/mL (25000 units/282mL sodium chloride 0.45%)  950 Units/hr Intravenous Continuous Kris Mouton, RPH      . insulin aspart (novoLOG) injection  0-15 Units  0-15 Units Subcutaneous TID WC & HS Prescott Gum, Collier Salina, MD      . insulin glargine (LANTUS) injection 25 Units  25 Units Subcutaneous BID Prescott Gum, Collier Salina, MD      . Derrill Memo ON 05/03/2017] levothyroxine (SYNTHROID, LEVOTHROID) tablet 100 mcg  100 mcg Oral QAC breakfast Rogelia Mire, NP      . nitroGLYCERIN (NITROSTAT) SL tablet 0.4 mg  0.4 mg Sublingual Q5 Min x 3 PRN Rogelia Mire, NP      . ondansetron [facility]) injection 4 mg  4 mg Intravenous Q6H PRN Rogelia Mire, NP      . sodium chloride flush (NS) 0.9 % injection 3 mL  3 mL Intravenous Q12H Murray Hodgkins R, NP      . sodium chloride flush (NS) 0.9 % injection 3 mL  3 mL Intravenous PRN Rogelia Mire, NP        Prescriptions Prior to Admission  Medication Sig Dispense Refill Last Dose  . alum & mag hydroxide-simeth (MAALOX/MYLANTA) 200-200-20 MG/5ML suspension Take 15 mLs by mouth every 6 (six) hours as needed for indigestion or heartburn.   Past Week at Unknown time  . benazepril-hydrochlorthiazide (LOTENSIN HCT) 20-12.5 MG per tablet Take 1 tablet by mouth 2 (two) times daily.    05/02/2017 at Unknown time  . Chlorpheniramine Maleate (ALLERGY PO) Take 1 tablet by mouth daily as needed (allergies).   Past Week at Unknown time  . cholecalciferol (VITAMIN D) 1000 units tablet Take 1,000 Units by mouth daily at 12 noon.   Past Week at Unknown time  . cloNIDine (CATAPRES) 0.3 MG tablet Take 0.3 mg by mouth 2 (two) times daily.   05/01/2017 at Unknown time  . fluvastatin (LESCOL) 40 MG capsule Take 40 mg by mouth at bedtime.   05/01/2017 at Unknown time  . insulin glargine (LANTUS) 100 UNIT/ML injection Inject 45 Units into the skin at bedtime.   05/01/2017 at Unknown time  . insulin lispro (HUMALOG) 100 UNIT/ML injection Inject 12-14 Units into the skin 2 (two) times daily. 12 units with lunch and 14 units with dinner   05/01/2017 at Unknown time  . insulin lispro protamine-lispro (HUMALOG 75/25 MIX) (75-25)  100 UNIT/ML SUSP injection Inject 19-38 Units into the skin daily with breakfast.   05/01/2017 at Unknown time  .  labetalol (NORMODYNE) 100 MG tablet Take 100 mg by mouth 2 (two) times daily.    05/02/2017 at Unknown time  . levothyroxine (SYNTHROID, LEVOTHROID) 100 MCG tablet Take 100 mcg by mouth daily before breakfast.   05/01/2017 at Unknown time  . Multiple Vitamin (MULTIVITAMIN) capsule Take 1 capsule by mouth daily at 12 noon.    05/01/2017 at Unknown time  . nitroGLYCERIN (NITROSTAT) 0.4 MG SL tablet Place 0.4 mg under the tongue every 5 (five) minutes as needed for chest pain.    05/01/2017 at Unknown time  . Polyethyl Glycol-Propyl Glycol (SYSTANE OP) Apply 1 drop to eye as needed (dry eyes).   05/01/2017 at Unknown time    Family History  Problem Relation Age of Onset  . Breast cancer Maternal Aunt      Review of Systems:    No prior thoracic trauma or surgery    No problems of bleeding disorder     No problems with anesthesia for previous hysterectomy     Cardiac Review of Systems: Y or N  Chest Pain [email or web address]    ]  Resting SOB [no   ] Exertional SOB  [ yes ]  Orthopnea [ no ]   Pedal Edema [ no  ]    Palpitations [place]  ] Syncope  [ no ]   Presyncope [ no  ]  General Review of Systems: [Y] = yes [  ]=no Constitional: recent weight change [  ]; anorexia [  ]; fatigue [place]  ]; nausea [  ]; night sweats [  ]; fever [  ]; or chills [  ]                                                               Dental: poor dentition[  ]; Last Dentist visit: One year  Eye : blurred vision [  ]; diplopia [   ]; vision changes [  ];  Amaurosis fugax[  ]; Resp: cough [  ];  wheezing[  ];  hemoptysis[  ]; shortness of breath[  ]; paroxysmal nocturnal dyspnea[  ]; dyspnea on exertion[ yes  ]; or orthopnea[  ];  GI:  gallstones[  ], vomiting[  ];  dysphagia[  ]; melena[  ];  hematochezia [  ]; heartburn[  ];   Hx of  Colonoscopy[  ]; GU: kidney stones [  ]; hematuria[  ];   dysuria [  ];  nocturia[  ];  history  of     obstruction [  ]; urinary frequency [  ]             Skin: rash, swelling[  ];, hair loss[  ];  peripheral edema[  ];  or itching[  ]; Musculosketetal: myalgias[  ];  joint swelling[  ];  joint erythema[  ];  joint pain[  ];  back pain[ yes  ];  Heme/Lymph: bruising[  ];  bleeding[  ];  anemia[  ];  Neuro: TIA[  ];  headaches[  ];  stroke[  ];  vertigo[  ];  seizures[  ];   paresthesias[  ];  difficulty walking[  ];  Psych:depression[  ]; anxiety[ yes mild ];  Endocrine: diabetes[ yes blood sugar today > 400 ];  thyroid dysfunction[  ];  Immunizations: Flu [  ];  Pneumococcal[  ];  Other: Right-hand dominant  Physical Exam: BP (!) 168/70 (BP Location: Right Arm)   Pulse 62   Temp 98.3 F (36.8 C) (Oral)   Resp 18   Ht 5\' 8"  (1.727 m)   Wt 186 lb 8 oz (84.6 kg)   SpO2 100%   BMI 28.36 kg/m        Physical Exam  General: Pleasant overweight AA female who appears younger than her stated age 60: Normocephalic pupils equal , dentition adequate Neck: Supple without JVD, adenopathy, or bruit Chest: Clear to auscultation, symmetrical breath sounds, no rhonchi, no tenderness             or deformity Cardiovascular: Regular rate and rhythm, no murmur, no gallop, peripheral pulses             palpable in all extremities Abdomen:  Soft, nontender, no palpable mass or organomegaly Extremities: Warm, well-perfused, no clubbing cyanosis edema or tenderness,              no venous stasis changes of the legs Rectal/GU: Deferred Neuro: Grossly non--focal and symmetrical throughout Skin: Clean and dry without rash or ulceration   Diagnostic Studies & Laboratory data:     Recent Radiology Findings:   No results found.   I have independently reviewed the above radiologic studies. Significant left main stenosis, severe ostial circumflex stenosis, severe proximal LAD stenosis, moderate RCA stenosis Recent Lab Findings: Lab Results  Component Value Date   WBC 5.2 04/28/2017    HGB 12.1 04/28/2017   HCT 36.6 04/28/2017   PLT 240 04/28/2017   GLUCOSE 66 04/28/2017   NA 142 04/28/2017   K 3.9 04/28/2017   CL 104 04/28/2017   CREATININE 0.95 04/28/2017   BUN 23 (H) 04/28/2017   CO2 29 04/28/2017   INR 0.98 04/28/2017      Assessment / Plan:      Very nice 76 year old diabetic with hypertension and accelerating angina She has mild LV dysfunction with severe multivessel CAD including left main disease Her best long-term therapy would be multivessel CABG which we scheduled on this admission after her blood sugars become better controlled and she undergoes her preoperative evaluation. I discussed the procedure with the patient and she demonstrates her understanding and agrees to proceed with the plan for multivessel CABG.     @ME1 @ 05/02/2017 8:32 PM

## 2017-05-02 NOTE — Interval H&P Note (Signed)
History and Physical Interval Note:  05/02/2017 12:39 PM  Carrie Kane  has presented today for surgery, with the diagnosis of Left Hearth Cath  The various methods of treatment have been discussed with the patient and family. After consideration of risks, benefits and other options for treatment, the patient has consented to  Procedure(s): Left Heart Cath and Coronary Angiography (N/A) as a surgical intervention .  The patient's history has been reviewed, patient examined, no change in status, stable for surgery.  I have reviewed the patient's chart and labs.  Questions were answered to the patient's satisfaction.     Kathlyn [place]

## 2017-05-02 NOTE — H&P (Signed)
History & Physical    Patient ID: Carrie Kane MRN: [id number], DOB/AGE: January 12, 1941   Admit date: (Not on file)   Primary Physician: Cletis Athens, MD Primary Cardiologist: Johnny Bridge, MD   Patient Profile    76 year old female with prior history of diabetes and hypertension who was recently evaluated in clinic by Dr. Rockey Situ with complaints of chest pain and abnormal stress test, who underwent diagnostic catheterization today revealing severe multivessel coronary artery disease.   Past Medical History    Past Medical History:  Diagnosis Date  . CAD (coronary artery disease)    a. 04/2017 Cath: LM 80, LAD 80p, 80m, LCX 95ost, 39m, EF 45-50%.  . Diabetes ([place])   . HBP (high blood pressure)     Past Surgical History:  Procedure Laterality Date  . ABDOMINAL HYSTERECTOMY       Allergies  Allergies  Allergen Reactions  . Flexeril [Cyclobenzaprine] Other (See Comments)    HBP    History of Present Illness    76 year old female with a prior history of hypertension, diabetes, and hypothyroidism. She did not have a prior cardiac history. She was recently evaluated by primary care due to exertional chest pain and dyspnea. A stress test was performed on July 12 through her primary care provider's office and this was stopped prematurely secondary to worsening chest pain. An echocardiogram was also performed and showed an EF of 45% with anteroseptal wall dyskinesia and diastolic dysfunction. As result of these findings, she was referred to Dr. Rockey Situ on July 12. It was felt that she would require diagnostic catheterization and this was set up for July 16. Catheterization this morning showed severe multivessel coronary artery disease and as result, she was admitted to [facility] while arrangements were made for transfer to [facility] for CT surgical evaluation. She has been chest pain-free during her time in [facility]. Blood pressures have been elevated.   Home Medications      Prior to Admission medications   Medication Sig Start Date End Date Taking? Authorizing Provider  alum & mag hydroxide-simeth (MAALOX/MYLANTA) 200-200-20 MG/5ML suspension Take 15 mLs by mouth every 6 (six) hours as needed for indigestion or heartburn.    [provider]  benazepril-hydrochlorthiazide (LOTENSIN HCT) 20-12.5 MG per tablet Take 1 tablet by mouth 2 (two) times daily.     [provider]  Chlorpheniramine Maleate (ALLERGY PO) Take 1 tablet by mouth daily as needed (allergies).    [provider]  cholecalciferol (VITAMIN D) 1000 units tablet Take 1,000 Units by mouth daily at 12 noon.    [provider]  cloNIDine (CATAPRES) 0.3 MG tablet Take 0.3 mg by mouth 2 (two) times daily.    [provider]  fluvastatin (LESCOL) 40 MG capsule Take 40 mg by mouth at bedtime.    [provider]  insulin glargine (LANTUS) 100 UNIT/ML injection Inject 45 Units into the skin at bedtime.    [provider]  insulin lispro (HUMALOG) 100 UNIT/ML injection Inject 12-14 Units into the skin 2 (two) times daily. 12 units with lunch and 14 units with dinner    [provider]  insulin lispro protamine-lispro (HUMALOG 75/25 MIX) (75-25) 100 UNIT/ML SUSP injection Inject 19-38 Units into the skin daily with breakfast.    [provider]  labetalol (NORMODYNE) 100 MG tablet Take 100 mg by mouth 2 (two) times daily.     [provider]  levothyroxine (SYNTHROID, LEVOTHROID) 100 MCG tablet Take 100 mcg by  mouth daily before breakfast.    [provider]  Multiple Vitamin (MULTIVITAMIN) capsule Take 1 capsule by mouth daily at 12 noon.     [provider]  nitroGLYCERIN (NITROSTAT) 0.4 MG SL tablet Place 0.4 mg under the tongue every 5 (five) minutes as needed for chest pain.  04/28/17   [provider]  Polyethyl Glycol-Propyl Glycol (SYSTANE OP) Apply 1 drop to eye as needed (dry eyes).     [provider]    Family History    Family History  Problem Relation Age of Onset  . Breast cancer Maternal Aunt     Social History    Social History   Social History  . Marital status: Married    Spouse name: N/A  . Number of children: N/A  . Years of education: N/A   Occupational History  . Not on file.   Social History Main Topics  . Smoking status: Never Smoker  . Smokeless tobacco: Never Used  . Alcohol use No  . Drug use: No  . Sexual activity: Not on file   Other Topics Concern  . Not on file   Social History Narrative  . No narrative on file     Review of Systems    General:  No chills, fever, night sweats or weight changes.  Cardiovascular: +++chest pain, +++ dyspnea on exertion, no   edema, orthopnea, palpitations, paroxysmal nocturnal dyspnea. Dermatological: No rash, lesions/masses Respiratory: No cough, +++ dyspnea Urologic: No hematuria, dysuria Abdominal:   No nausea, vomiting, diarrhea, bright red blood per rectum, melena, or hematemesis Neurologic:  No visual changes, wkns, changes in mental status. All other systems reviewed and are otherwise negative except as noted above.  Physical Exam    Temperature 98.4, heart rate 73, respirations 11, blood pressure 152/61, pulse ox 97% on room air  General: Pleasant, NAD Psych: Normal affect. Neuro: Alert and oriented X 3. Moves all extremities spontaneously. HEENT: Normal  Neck: Supple without bruits or JVD. Lungs:  Resp regular and unlabored, CTA. Heart: RRR no s3, s4, or murmurs. Abdomen: Soft, non-tender, non-distended, BS + x 4.  Extremities: No clubbing, cyanosis or edema. DP/PT/Radials 2+ and equal bilaterally.  Labs   Lab Results  Component Value Date   WBC 5.2 04/28/2017   HGB 12.1 04/28/2017   HCT 36.6 04/28/2017   MCV 86.5 04/28/2017   PLT 240 04/28/2017    Recent Labs Lab 04/28/17 1828  NA 142  K 3.9  CL 104  CO2 29  BUN 23*  CREATININE 0.95  CALCIUM 9.9    GLUCOSE 66     Radiology Studies    Dg Chest 2 View  Result Date: 04/29/2017 CLINICAL DATA:  Angina pectoris. Abnormal stress test. Pre-op respiratory exam EXAM: CHEST  2 VIEW COMPARISON:  None. FINDINGS: The heart size and mediastinal contours are within normal limits. Both lungs are clear. The visualized skeletal structures are unremarkable. IMPRESSION: No active cardiopulmonary disease. Electronically Signed   By: Earle Gell M.D.   On: 04/29/2017 14:17    ECG & Cardiac Imaging    Cath results as outlined in the past medical history.  Assessment & Plan    1. Unstable angina/coronary artery disease: patient was recently evaluated secondary to progressive dyspnea and exertional chest discomfort. Catheterization this morning shows severe multivessel coronary artery disease. She will be transferred to [facility] today for further CT surgical evaluation. Continue aspirin, heparin, beta blocker, and statin therapy.   2. Essential hypertension: Blood  pressure elevated during catheterization. She was previously on labetalol and this has  been switched to carvedilol.  Continue ACE inhibitor and clonidine.  3. Hyperlipidemia: Change to high potency statin therapy in the setting of above.  4. Type 2 diabetes mellitus: Continue home insulin regimen and add sliding scale insulin.  Signed, Murray Hodgkins, NP 05/02/2017, 5:14 PM

## 2017-05-02 NOTE — Progress Notes (Signed)
ANTICOAGULATION CONSULT NOTE - Initial Consult  Pharmacy Consult for heparin  Indication: chest pain/ACS  Allergies  Allergen Reactions  . Flexeril [Cyclobenzaprine] Other (See Comments)    HBP    Patient Measurements: Heparin dosing wt= 81.9kg  Vital Signs: Temp: 98.4 F (36.9 C) (07/16 1005) Temp Source: Oral (07/16 1005) BP: 152/61 (07/16 1637) Pulse Rate: 88 (07/16 1530)  Labs: No results for input(s): HGB, HCT, PLT, APTT, LABPROT, INR, HEPARINUNFRC, HEPRLOWMOCWT, CREATININE, CKTOTAL, CKMB, TROPONINI in the last 72 hours.  Estimated Creatinine Clearance: 59.5 mL/min (by C-G formula based on SCr of 0.95 mg/dL).   Medical History: Past Medical History:  Diagnosis Date  . CAD (coronary artery disease)    a. 04/2017 Cath: LM 80, LAD 80p, 67m, LCX 95ost, 79m, EF 45-50%.  . Diabetes ([place])   . HBP (high blood pressure)     Medications:  Prescriptions Prior to Admission  Medication Sig Dispense Refill Last Dose  . alum & mag hydroxide-simeth (MAALOX/MYLANTA) 200-200-20 MG/5ML suspension Take 15 mLs by mouth every 6 (six) hours as needed for indigestion or heartburn.   Past Week at Unknown time  . benazepril-hydrochlorthiazide (LOTENSIN HCT) 20-12.5 MG per tablet Take 1 tablet by mouth 2 (two) times daily.    05/02/2017 at Unknown time  . Chlorpheniramine Maleate (ALLERGY PO) Take 1 tablet by mouth daily as needed (allergies).   Past Week at Unknown time  . cholecalciferol (VITAMIN D) 1000 units tablet Take 1,000 Units by mouth daily at 12 noon.   Past Week at Unknown time  . cloNIDine (CATAPRES) 0.3 MG tablet Take 0.3 mg by mouth 2 (two) times daily.   05/01/2017 at Unknown time  . fluvastatin (LESCOL) 40 MG capsule Take 40 mg by mouth at bedtime.   05/01/2017 at Unknown time  . insulin glargine (LANTUS) 100 UNIT/ML injection Inject 45 Units into the skin at bedtime.   05/01/2017 at Unknown time  . insulin lispro (HUMALOG) 100 UNIT/ML injection Inject 12-14 Units into the skin 2  (two) times daily. 12 units with lunch and 14 units with dinner   05/01/2017 at Unknown time  . insulin lispro protamine-lispro (HUMALOG 75/25 MIX) (75-25) 100 UNIT/ML SUSP injection Inject 19-38 Units into the skin daily with breakfast.   05/01/2017 at Unknown time  . labetalol (NORMODYNE) 100 MG tablet Take 100 mg by mouth 2 (two) times daily.    05/02/2017 at Unknown time  . levothyroxine (SYNTHROID, LEVOTHROID) 100 MCG tablet Take 100 mcg by mouth daily before breakfast.   05/01/2017 at Unknown time  . Multiple Vitamin (MULTIVITAMIN) capsule Take 1 capsule by mouth daily at 12 noon.    05/01/2017 at Unknown time  . nitroGLYCERIN (NITROSTAT) 0.4 MG SL tablet Place 0.4 mg under the tongue every 5 (five) minutes as needed for chest pain.    05/01/2017 at Unknown time  . Polyethyl Glycol-Propyl Glycol (SYSTANE OP) Apply 1 drop to eye as needed (dry eyes).   05/01/2017 at Unknown time   Scheduled:  . [START ON 05/03/2017] aspirin EC  81 mg Oral Daily  . atorvastatin  40 mg Oral q1800  . benazepril  20 mg Oral Daily  . carvedilol  6.25 mg Oral BID WC  . cloNIDine  0.3 mg Oral BID  . [START ON 05/03/2017] insulin aspart  0-15 Units Subcutaneous TID WC  . [START ON 05/03/2017] insulin glargine  45 Units Subcutaneous BH-q7a  . [START ON 05/03/2017] levothyroxine  100 mcg Oral QAC breakfast  . sodium chloride flush  3 mL Intravenous Q12H    Assessment: 76 yo female with CP s/p cath with multivessel CAD and for possible CABG. Pharmacy consulted for heparin (sheath removed  ~ 1pm). No anticoagulants noted PTA.  Goal of Therapy:  Heparin level 0.3-0.7 units/ml Monitor platelets by anticoagulation protocol: Yes   Plan:  -No heparin bolus with recent cath -Start heparin 8 hours post sheath at 950 units/hr -Heparin level in 8 hours and daily wth CBC daily  Hildred Laser, Pharm D 05/02/2017 6:05 PM

## 2017-05-02 NOTE — Progress Notes (Signed)
Orders to transfer to [facility] for CABG/ report called to [facility] on [address]/ [facility]  To transfer pt.

## 2017-05-03 ENCOUNTER — Encounter (HOSPITAL_COMMUNITY): Payer: Self-pay

## 2017-05-03 ENCOUNTER — Inpatient Hospital Stay (HOSPITAL_COMMUNITY): Payer: Medicare Other

## 2017-05-03 DIAGNOSIS — I200 Unstable angina: Secondary | ICD-10-CM

## 2017-05-03 DIAGNOSIS — E1159 Type 2 diabetes mellitus with other circulatory complications: Secondary | ICD-10-CM

## 2017-05-03 DIAGNOSIS — I2510 Atherosclerotic heart disease of native coronary artery without angina pectoris: Secondary | ICD-10-CM

## 2017-05-03 DIAGNOSIS — I25110 Atherosclerotic heart disease of native coronary artery with unstable angina pectoris: Secondary | ICD-10-CM

## 2017-05-03 LAB — CBC
HEMATOCRIT: 34.2 % — AB (ref 36.0–46.0)
Hemoglobin: 11.3 g/dL — ABNORMAL LOW (ref 12.0–15.0)
MCH: 28.2 pg (ref 26.0–34.0)
MCHC: 33 g/dL (ref 30.0–36.0)
MCV: 85.3 fL (ref 78.0–100.0)
Platelets: 228 10*3/uL (ref 150–400)
RBC: 4.01 MIL/uL (ref 3.87–5.11)
RDW: 13.5 % (ref 11.5–15.5)
WBC: 6.2 10*3/uL (ref 4.0–10.5)

## 2017-05-03 LAB — ECHOCARDIOGRAM COMPLETE
E decel time: 408 msec
E/e' ratio: 9.03
FS: 31 % (ref 28–44)
Height: 68 in
IVS/LV PW RATIO, ED: 0.93
LA ID, A-P, ES: 45 mm
LA diam end sys: 45 mm
LA diam index: 2.21 cm/m2
LA vol A4C: 52.4 ml
LA vol index: 26.4 mL/m2
LA vol: 53.9 mL
LV E/e' medial: 9.03
LV E/e'average: 9.03
LV PW d: 10.9 mm — AB (ref 0.6–1.1)
LV dias vol index: 33 mL/m2
LV dias vol: 67 mL (ref 46–106)
LV sys vol index: 15 mL/m2
LV sys vol: 31 mL (ref 14–42)
LVOT SV: 89 mL
LVOT VTI: 28.2 cm
LVOT area: 3.14 cm2
LVOT diameter: 20 mm
LVOT peak grad rest: 5 mmHg
LVOT peak vel: 112 cm/s
Lateral S' vel: 10.3 cm/s
MV Dec: 408
MV Peak grad: 2 mmHg
MV pk A vel: 141 m/s
MV pk E vel: 75.7 m/s
RV sys press: 30 mmHg
Reg peak vel: 261 cm/s
Simpson's disk: 54
Stroke v: 36 ml
TAPSE: 21.4 mm
TDI e' medial: 8.38
TR max vel: 261 cm/s
Weight: 2998.4 oz

## 2017-05-03 LAB — URINALYSIS, ROUTINE W REFLEX MICROSCOPIC
Bilirubin Urine: NEGATIVE
Glucose, UA: >=500 mg/dL — AB
Hgb urine dipstick: NEGATIVE
Ketones, ur: NEGATIVE mg/dL
Nitrite: NEGATIVE
Protein, ur: NEGATIVE mg/dL
Specific Gravity, Urine: 1.011 (ref 1.005–1.030)
pH: 6 (ref 5.0–8.0)

## 2017-05-03 LAB — GLUCOSE, CAPILLARY
GLUCOSE-CAPILLARY: 222 mg/dL — AB (ref 65–99)
GLUCOSE-CAPILLARY: 258 mg/dL — AB (ref 65–99)
GLUCOSE-CAPILLARY: 298 mg/dL — AB (ref 65–99)
GLUCOSE-CAPILLARY: 78 mg/dL (ref 65–99)
Glucose-Capillary: 137 mg/dL — ABNORMAL HIGH (ref 65–99)
Glucose-Capillary: 70 mg/dL (ref 65–99)
Glucose-Capillary: 125 mg/dL — ABNORMAL HIGH (ref 65–99)
Glucose-Capillary: 146 mg/dL — ABNORMAL HIGH (ref 65–99)

## 2017-05-03 LAB — BASIC METABOLIC PANEL
Anion gap: 6 (ref 5–15)
BUN: 22 mg/dL — AB (ref 6–20)
CHLORIDE: 105 mmol/L (ref 101–111)
CO2: 28 mmol/L (ref 22–32)
Calcium: 9.2 mg/dL (ref 8.9–10.3)
Creatinine, Ser: 0.91 mg/dL (ref 0.44–1.00)
GFR calc Af Amer: >60 mL/min (ref >60)
GFR calc non Af Amer: >60 mL/min (ref >60)
GLUCOSE: 148 mg/dL — AB (ref 65–99)
POTASSIUM: 3.8 mmol/L (ref 3.5–5.1)
Sodium: 139 mmol/L (ref 135–145)

## 2017-05-03 LAB — LIPID PANEL
Cholesterol: 155 mg/dL (ref 0–200)
HDL: 55 mg/dL (ref >40)
LDL CALC: 81 mg/dL (ref 0–99)
Total CHOL/HDL Ratio: 2.8 RATIO
Triglycerides: 93 mg/dL (ref <150)
VLDL: 19 mg/dL (ref 0–40)

## 2017-05-03 LAB — HEPARIN LEVEL (UNFRACTIONATED)
HEPARIN UNFRACTIONATED: 0.46 [IU]/mL (ref 0.30–0.70)
Heparin Unfractionated: 0.42 IU/mL (ref 0.30–0.70)

## 2017-05-03 LAB — TSH: TSH: 0.372 u[IU]/mL (ref 0.350–4.500)

## 2017-05-03 LAB — PROTIME-INR
INR: 1.09
Prothrombin Time: 14.1 seconds (ref 11.4–15.2)

## 2017-05-03 LAB — SURGICAL PCR SCREEN
MRSA, PCR: NEGATIVE
Staphylococcus aureus: NEGATIVE

## 2017-05-03 MED ORDER — INSULIN ASPART 100 UNIT/ML ~~LOC~~ SOLN
8.0000 [IU] | Freq: Three times a day (TID) | SUBCUTANEOUS | Status: DC
  Administered 2017-05-03: 8 [IU] via SUBCUTANEOUS

## 2017-05-03 MED ORDER — INSULIN ASPART 100 UNIT/ML ~~LOC~~ SOLN
6.0000 [IU] | Freq: Three times a day (TID) | SUBCUTANEOUS | Status: DC
  Administered 2017-05-04 – 2017-05-07 (×9): 6 [IU] via SUBCUTANEOUS

## 2017-05-03 NOTE — Progress Notes (Signed)
The patient got her first dose of meal coverage of 8 units at dinner time with a CBG of ~140 plus sliding scale 2 units. She called the nurse not feeling well, diaphoretic, stating her sugar was low. CBG down to 70 after eating 100% of her meal. Notified Tacy Dura PA with CVTS, verbal to change meal coverage to 6 units based of Diabetes Coordinator recommendation. Verbal to hold Lantus night time dose if CBG <200 at HS check. I will continue to monitor the patient closely.   Saddie Benders RN

## 2017-05-03 NOTE — Progress Notes (Signed)
  2D Echocardiogram has been performed.  Carrie Kane 05/03/2017, 4:20 PM

## 2017-05-03 NOTE — Progress Notes (Signed)
Progress Note  Patient Name: Carrie Kane Date of Encounter: 05/03/2017  Primary Cardiologist: Dr. Rockey Situ   Subjective   No complaints. Currently CP free. No dyspnea.   Inpatient Medications    Scheduled Meds: . aspirin EC  81 mg Oral Daily  . atorvastatin  40 mg Oral q1800  . benazepril  20 mg Oral Daily  . carvedilol  6.25 mg Oral BID WC  . cloNIDine  0.3 mg Oral BID  . insulin aspart  0-15 Units Subcutaneous TID WC & HS  . insulin glargine  25 Units Subcutaneous BID  . levothyroxine  100 mcg Oral QAC breakfast  . sodium chloride flush  3 mL Intravenous Q12H   Continuous Infusions: . sodium chloride    . heparin 950 Units/hr (05/02/17 2127)   PRN Meds: sodium chloride, acetaminophen, nitroGLYCERIN, ondansetron (ZOFRAN) IV, sodium chloride flush   Vital Signs    Vitals:   05/02/17 1813 05/02/17 1851 05/02/17 2129 05/03/17 0524  BP: (!) 168/70  (!) 161/68 (!) 155/68  Pulse:  62 66 69  Resp: 18     Temp: 98.3 F (36.8 C)  99.3 F (37.4 C) 98.3 F (36.8 C)  TempSrc: Oral  Oral Oral  SpO2: 100%  100% 97%  Weight: 186 lb 8 oz (84.6 kg)   187 lb 6.4 oz (85 kg)  Height: 5\' 8"  (1.727 m)       Intake/Output Summary (Last 24 hours) at 05/03/17 0822 Last data filed at 05/03/17 0036  Gross per 24 hour  Intake                0 ml  Output              950 ml  Net             -950 ml   Filed Weights   05/02/17 1813 05/03/17 0524  Weight: 186 lb 8 oz (84.6 kg) 187 lb 6.4 oz (85 kg)    Telemetry    NSR- Personally Reviewed  ECG    NSR LAFB- Personally Reviewed  Physical Exam   GEN: No acute distress.   Neck: No JVD Cardiac: RRR, no murmurs, rubs, or gallops.  Respiratory: Clear to auscultation bilaterally. GI: Soft, nontender, non-distended  MS: No edema; No deformity. Neuro:  Nonfocal  Psych: Normal affect   Labs    Chemistry Recent Labs Lab 04/28/17 1828 05/03/17 0454  NA 142 139  K 3.9 3.8  CL 104 105  CO2 29 28  GLUCOSE 66 148*  BUN  23* 22*  CREATININE 0.95 0.91  CALCIUM 9.9 9.2  GFRNONAA 58* >60  GFRAA >60 >60  ANIONGAP 9 6     Hematology Recent Labs Lab 04/28/17 1828 05/03/17 0454  WBC 5.2 6.2  RBC 4.23 4.01  HGB 12.1 11.3*  HCT 36.6 34.2*  MCV 86.5 85.3  MCH 28.6 28.2  MCHC 33.0 33.0  RDW 13.9 13.5  PLT 240 228    Cardiac EnzymesNo results for input(s): TROPONINI in the last 168 hours. No results for input(s): TROPIPOC in the last 168 hours.   BNPNo results for input(s): BNP, PROBNP in the last 168 hours.   DDimer No results for input(s): DDIMER in the last 168 hours.   Radiology    No results found.  Cardiac Studies   LHC 05/02/17 Conclusion     LM lesion, 80 %stenosed.  Ost Cx to Prox Cx lesion, 95 %stenosed.  Mid Cx lesion, 70 %stenosed.  Mid  LAD lesion, 30 %stenosed.  Prox LAD lesion, 80 %stenosed.  There is mild left ventricular systolic dysfunction.  LV end diastolic pressure is moderately elevated.  The left ventricular ejection fraction is 45-50% by visual estimate.   1. Severe heavily calcified calcified left main stenosis extending into the ostium of the left circumflex with significant proximal LAD stenosis. Diffuse diabetic branch disease. 2. Mildly reduced LV systolic function with an EF of 45-50% with moderate mid to distal anterior and apical hypokinesis. 3. Severely elevated systemic hypertension with moderately elevated left ventricular end-diastolic pressure.       Patient Profile     76 y.o. female with DM, HTN and CAD s/p recent LHC demonstrating severe multivessel disease with LM involvment, transferred from [facility] by Dr. Fletcher Anon for CABG.   Assessment & Plan    1. Unstable Angina/ CAD: LHC 05/02/17 at [facility] showed severe multivessel coronary artery disease with 80% LM lesion. Evaluated by Dr. Prescott Gum yesterday. CAGB recommend. Pre-operative evaluation pending. Tentatively planned for Friday 05/06/17. Continue medical therapy for now. IV  heparin until surgery, ASA, statin, BB and ACE-I. Echo pending. EF by cath was 45-50%.    2. Mildly Reduced LV Systolic Dysfunction: Echo pending. EF by cath 45-50%. In the setting CAD. Continue Coreg and benazepril. Volume stable. Monitor.   3. DM: fasting BG 148. Continue Insulin. Hgb A1c pending   4. HTN: has been elevated this admission. Most recent BP 149/73. Continue to monitor. Continue Coreg, benazepril and clonidine.   5. HLD: LDL 81 mg/dL. Was on fluvastatin 40 mg outpatient. Now on Lipitor 40. Recheck FLP and HFTs in 6-8 weeks. Goal LDL <70 mg/dL. If not at goal, bump up Lipitor to 80 mg +/- Zetia.   Signed, Lyda Jester, PA-C  05/03/2017, 8:22 AM    I have seen and examined the patient along with Lyda Jester, PA-C .  I have reviewed the chart, notes and new data.  I agree with PA/NP's note.  Key new complaints: asymptomatic at rest Key examination changes: no overt hypervolemia Key new findings / data: workup for CABG in process  PLAN: Keep on IV heparin until CABG.  Sanda Klein, MD, [place] [phone] 05/03/2017, 9:36 AM

## 2017-05-03 NOTE — Progress Notes (Signed)
Inpatient Diabetes Program Recommendations  AACE/ADA: New Consensus Statement on Inpatient Glycemic Control (2015)  Target Ranges:  Prepandial:   less than 140 mg/dL      Peak postprandial:   less than 180 mg/dL (1-2 hours)      Critically ill patients:  140 - 180 mg/dL   Lab Results  Component Value Date   GLUCAP 258 (H) 05/03/2017    Review of Glycemic Control:  Results for Carrie Kane, MIN (MRN [id number]) as of 05/03/2017 11:55  Ref. Range 05/02/2017 14:14 05/02/2017 18:24 05/02/2017 21:25 05/03/2017 00:39 05/03/2017 07:45 05/03/2017 10:14 05/03/2017 11:53  Glucose-Capillary Latest Ref Range: 65 - 99 mg/dL 405 (H) 369 (H) 151 (H) 78 222 (H) 298 (H) 258 (H)   Diabetes history: Type 2 diabetes Outpatient Diabetes medications: Lantus 45 units q HS, Humalog 12-14 units bid, Humalog 75/25 19-38 units with breakfast Current orders for Inpatient glycemic control:  Novolog moderate tid with meals and HS, Lantus 25 units bid  Inpatient Diabetes Program Recommendations:   Please consider adding Novolog 6 units tid with meals-Hold if patient eats less than 50%.   Thanks, Adah Perl, RN, BC-ADM Inpatient Diabetes Coordinator Pager [phone] (8a-5p)

## 2017-05-03 NOTE — Progress Notes (Signed)
ANTICOAGULATION CONSULT NOTE  Pharmacy Consult:  Heparin  Indication: chest pain/ACS  Allergies  Allergen Reactions  . Flexeril [Cyclobenzaprine] Other (See Comments)    HBP    Patient Measurements: Heparin dosing wt = 82 kg  Vital Signs: Temp: 98.3 F (36.8 C) (07/17 0524) Temp Source: Oral (07/17 0524) BP: 149/73 (07/17 0732) Pulse Rate: 69 (07/17 0524)  Labs:  Recent Labs  05/03/17 0454 05/03/17 1201  HGB 11.3*  --   HCT 34.2*  --   PLT 228  --   LABPROT 14.1  --   INR 1.09  --   HEPARINUNFRC 0.42 0.46  CREATININE 0.91  --     Estimated Creatinine Clearance: 61.9 mL/min (by C-G formula based on SCr of 0.91 mg/dL).   Assessment: [name] with chest pain now s/p cath.  Pharmacy consulted to dose heparin while awaiting CABG on ?Friday.  Heparin level is therapeutic and stable.  No bleeding reported.   Goal of Therapy:  Heparin level 0.3-0.7 units/ml Monitor platelets by anticoagulation protocol: Yes    Plan:  Continue heparin gtt at 950 units/hr Daily heparin level and CBC F/U CBGs   Carrie Kane, PharmD, BCPS Pager:  [phone] 05/03/2017, 1:19 PM

## 2017-05-04 ENCOUNTER — Inpatient Hospital Stay (HOSPITAL_COMMUNITY): Payer: Medicare Other

## 2017-05-04 ENCOUNTER — Encounter (HOSPITAL_COMMUNITY): Payer: Medicare Other

## 2017-05-04 DIAGNOSIS — E7800 Pure hypercholesterolemia, unspecified: Secondary | ICD-10-CM

## 2017-05-04 DIAGNOSIS — I5041 Acute combined systolic (congestive) and diastolic (congestive) heart failure: Secondary | ICD-10-CM

## 2017-05-04 DIAGNOSIS — I10 Essential (primary) hypertension: Secondary | ICD-10-CM

## 2017-05-04 LAB — CBC
HEMATOCRIT: 36.4 % (ref 36.0–46.0)
Hemoglobin: 12 g/dL (ref 12.0–15.0)
MCH: 28.4 pg (ref 26.0–34.0)
MCHC: 33 g/dL (ref 30.0–36.0)
MCV: 86.1 fL (ref 78.0–100.0)
PLATELETS: 238 10*3/uL (ref 150–400)
RBC: 4.23 MIL/uL (ref 3.87–5.11)
RDW: 14 % (ref 11.5–15.5)
WBC: 7 10*3/uL (ref 4.0–10.5)

## 2017-05-04 LAB — PULMONARY FUNCTION TEST
FEF 25-75 Post: 1.85 L/sec
FEF 25-75 Pre: 2.7 L/sec
FEF2575-%Change-Post: -31 %
FEF2575-%Pred-Post: 101 %
FEF2575-%Pred-Pre: 148 %
FEV1-%Change-Post: -4 %
FEV1-%Pred-Post: 84 %
FEV1-%Pred-Pre: 89 %
FEV1-Post: 1.78 L
FEV1-Pre: 1.86 L
FEV1FVC-%Change-Post: -8 %
FEV1FVC-%Pred-Pre: 120 %
FEV6-%Change-Post: 3 %
FEV6-%Pred-Post: 81 %
FEV6-%Pred-Pre: 78 %
FEV6-Post: 2.11 L
FEV6-Pre: 2.03 L
FEV6FVC-%Pred-Post: 103 %
FEV6FVC-%Pred-Pre: 103 %
FVC-%Change-Post: 3 %
FVC-%Pred-Post: 78 %
FVC-%Pred-Pre: 75 %
FVC-Post: 2.11 L
FVC-Pre: 2.03 L
Post FEV1/FVC ratio: 84 %
Post FEV6/FVC ratio: 100 %
Pre FEV1/FVC ratio: 92 %
Pre FEV6/FVC Ratio: 100 %

## 2017-05-04 LAB — GLUCOSE, CAPILLARY
GLUCOSE-CAPILLARY: 340 mg/dL — AB (ref 65–99)
Glucose-Capillary: 125 mg/dL — ABNORMAL HIGH (ref 65–99)
Glucose-Capillary: 150 mg/dL — ABNORMAL HIGH (ref 65–99)
Glucose-Capillary: 62 mg/dL — ABNORMAL LOW (ref 65–99)
Glucose-Capillary: 122 mg/dL — ABNORMAL HIGH (ref 65–99)

## 2017-05-04 LAB — HEMOGLOBIN A1C
Hgb A1c MFr Bld: 8.4 % — ABNORMAL HIGH (ref 4.8–5.6)
Mean Plasma Glucose: 194 mg/dL

## 2017-05-04 LAB — HEPARIN LEVEL (UNFRACTIONATED): Heparin Unfractionated: 0.49 IU/mL (ref 0.30–0.70)

## 2017-05-04 MED ORDER — AMLODIPINE BESYLATE 5 MG PO TABS
5.0000 mg | ORAL_TABLET | Freq: Every day | ORAL | Status: DC
  Administered 2017-05-04 – 2017-05-06 (×3): 5 mg via ORAL
  Filled 2017-05-04 (×3): qty 1

## 2017-05-04 MED ORDER — ALBUTEROL SULFATE (2.5 MG/3ML) 0.083% IN NEBU
2.5000 mg | INHALATION_SOLUTION | Freq: Once | RESPIRATORY_TRACT | Status: AC
  Administered 2017-05-04: 2.5 mg via RESPIRATORY_TRACT

## 2017-05-04 MED ORDER — ALBUTEROL SULFATE (2.5 MG/3ML) 0.083% IN NEBU
INHALATION_SOLUTION | RESPIRATORY_TRACT | Status: AC
  Filled 2017-05-04: qty 3

## 2017-05-04 NOTE — Progress Notes (Signed)
CARDIAC REHAB PHASE I   PRE:  Rate/Rhythm: 96 SR  BP:  Sitting: 166/72        SaO2: 97 RA  MODE:  Ambulation: 500 ft   POST:  Rate/Rhythm: 86 SR  BP:  Sitting: 173/81         SaO2: 100 RA  Pt ambulated 500 ft on RA, IV, assist x1, steady gait, tolerated well with no complaints, happy to walk. Completed cardiac surgery pre-op education. Reviewed IS, activity progression, sternal precautions, cardiac surgery booklet and cardiac surgery guidelines. Pt verbalized understanding, declines cardiac surgery videos at this time. Pt to recliner after walk, call bell within reach. Will follow.   [place], RN, BSN 05/04/2017 3:17 PM

## 2017-05-04 NOTE — Progress Notes (Signed)
ANTICOAGULATION CONSULT NOTE  Pharmacy Consult:  Heparin  Indication: chest pain/ACS  Allergies  Allergen Reactions  . Flexeril [Cyclobenzaprine] Other (See Comments)    HBP    Patient Measurements: Heparin dosing wt = 82 kg  Vital Signs: Temp: 98.9 F (37.2 C) (07/18 0440) Temp Source: Oral (07/18 0440) BP: 164/78 (07/18 0440) Pulse Rate: 63 (07/18 0440)  Labs:  Recent Labs  05/03/17 0454 05/03/17 1201 05/04/17 0228  HGB 11.3*  --  12.0  HCT 34.2*  --  36.4  PLT 228  --  238  LABPROT 14.1  --   --   INR 1.09  --   --   HEPARINUNFRC 0.42 0.46 0.49  CREATININE 0.91  --   --     Estimated Creatinine Clearance: 61.6 mL/min (by C-G formula based on SCr of 0.91 mg/dL).   Assessment: 47 YOF with chest pain now s/p cath.   Pharmacy consulted to dose heparin while awaiting CABG on Friday.    Heparin level is therapeutic and stable.  No bleeding reported.   Goal of Therapy:  Heparin level 0.3-0.7 units/ml Monitor platelets by anticoagulation protocol: Yes    Plan:  Continue heparin gtt at 950 units/hr Daily heparin level and CBC   Sloan Leiter, PharmD, BCPS Clinical Pharmacist Clinical Phone 05/04/2017 until 3:30 PM - #[phone] After hours, please call [phone] 05/04/2017, 10:28 AM

## 2017-05-04 NOTE — Progress Notes (Signed)
Procedure(s) (LRB): CORONARY ARTERY BYPASS GRAFTING (CABG) (N/A) INTRAOPERATIVE TRANSESOPHAGEAL ECHOCARDIOGRAM (N/A) Subjective: Some pain last night CBGs getting better PFTs ok Echo with normal systolic function, valves ok Objective: Vital signs in last 24 hours: Temp:  [98.4 F (36.9 C)-98.9 F (37.2 C)] 98.9 F (37.2 C) (07/18 0440) Pulse Rate:  [63-73] 63 (07/18 0440) Cardiac Rhythm: Normal sinus rhythm (07/18 0700) Resp:  [18] 18 (07/17 1348) BP: (164-222)/(71-82) 164/78 (07/18 0440) SpO2:  [98 %-100 %] 100 % (07/18 0440) Weight:  [184 lb 11.2 oz (83.8 kg)] 184 lb 11.2 oz (83.8 kg) (07/18 0440)  Hemodynamic parameters for last 24 hours:  stable  Intake/Output from previous day: 07/17 0701 - 07/18 0700 In: 1033 [P.O.:720; I.V.:313] Out: -  Intake/Output this shift: Total I/O In: 240 [P.O.:240] Out: -        Exam    General- alert and comfortable   Lungs- clear without rales, wheezes   Cor- regular rate and rhythm, no murmur , gallop   Abdomen- soft, non-tender   Extremities - warm, non-tender, minimal edema   Neuro- oriented, appropriate, no focal weakness   Lab Results:  Recent Labs  05/03/17 0454 05/04/17 0228  WBC 6.2 7.0  HGB 11.3* 12.0  HCT 34.2* 36.4  PLT 228 238   BMET:  Recent Labs  05/03/17 0454  NA 139  K 3.8  CL 105  CO2 28  GLUCOSE 148*  BUN 22*  CREATININE 0.91  CALCIUM 9.2    PT/INR:  Recent Labs  05/03/17 0454  LABPROT 14.1  INR 1.09   ABG No results found for: PHART, HCO3, TCO2, ACIDBASEDEF, O2SAT CBG (last 3)   Recent Labs  05/03/17 2052 05/04/17 0730 05/04/17 1029  GLUCAP 146* 125* 150*    Assessment/Plan: S/P Procedure(s) (LRB): CORONARY ARTERY BYPASS GRAFTING (CABG) (N/A) INTRAOPERATIVE TRANSESOPHAGEAL ECHOCARDIOGRAM (N/A) CABG first avail OR availability- prob Mon    LOS: 2 days    Carrie Kane 05/04/2017

## 2017-05-04 NOTE — Progress Notes (Signed)
Progress Note  Patient Name: Carrie Kane Date of Encounter: 05/04/2017  Primary Cardiologist: Dr. Rockey Situ   Subjective   No complaints. Currently CP free. No dyspnea. Had 1 brief episode of sharp chest pain last night around 8pm, without associated symptoms and relieved with NTG.   Inpatient Medications    Scheduled Meds: . aspirin EC  81 mg Oral Daily  . atorvastatin  40 mg Oral q1800  . benazepril  20 mg Oral Daily  . carvedilol  6.25 mg Oral BID WC  . cloNIDine  0.3 mg Oral BID  . insulin aspart  0-15 Units Subcutaneous TID WC & HS  . insulin aspart  6 Units Subcutaneous TID WC  . insulin glargine  25 Units Subcutaneous BID  . levothyroxine  100 mcg Oral QAC breakfast  . sodium chloride flush  3 mL Intravenous Q12H   Continuous Infusions: . sodium chloride    . heparin 950 Units/hr (05/03/17 2247)   PRN Meds: sodium chloride, acetaminophen, nitroGLYCERIN, ondansetron (ZOFRAN) IV, sodium chloride flush   Vital Signs    Vitals:   05/03/17 2048 05/03/17 2053 05/03/17 2227 05/04/17 0440  BP: (!) 187/82 (!) 187/82 (!) 204/79 (!) 164/78  Pulse:  73  63  Resp:      Temp:  98.4 F (36.9 C)  98.9 F (37.2 C)  TempSrc:  Oral  Oral  SpO2:  99%  100%  Weight:    184 lb 11.2 oz (83.8 kg)  Height:        Intake/Output Summary (Last 24 hours) at 05/04/17 0938 Last data filed at 05/04/17 0911  Gross per 24 hour  Intake          1033.03 ml  Output                0 ml  Net          1033.03 ml   Filed Weights   05/02/17 1813 05/03/17 0524 05/04/17 0440  Weight: 186 lb 8 oz (84.6 kg) 187 lb 6.4 oz (85 kg) 184 lb 11.2 oz (83.8 kg)    Telemetry    NSR, 60's-80's, had a short run of SVT last evening. - Personally Reviewed  ECG   NSR LAFB, LVH unchanged from previous- Personally Reviewed  Physical Exam   GEN: No acute distress.   Neck: No JVD Cardiac: RRR, no murmurs, rubs, or gallops.  Respiratory: Clear to auscultation bilaterally. GI: Soft, nontender,  non-distended  MS: No edema; No deformity. Neuro:  Nonfocal  Psych: Normal affect   Labs    Chemistry  Recent Labs Lab 04/28/17 1828 05/03/17 0454  NA 142 139  K 3.9 3.8  CL 104 105  CO2 29 28  GLUCOSE 66 148*  BUN 23* 22*  CREATININE 0.95 0.91  CALCIUM 9.9 9.2  GFRNONAA 58* >60  GFRAA >60 >60  ANIONGAP 9 6     Hematology  Recent Labs Lab 04/28/17 1828 05/03/17 0454 05/04/17 0228  WBC 5.2 6.2 7.0  RBC 4.23 4.01 4.23  HGB 12.1 11.3* 12.0  HCT 36.6 34.2* 36.4  MCV 86.5 85.3 86.1  MCH 28.6 28.2 28.4  MCHC 33.0 33.0 33.0  RDW 13.9 13.5 14.0  PLT 240 228 238    Cardiac EnzymesNo results for input(s): TROPONINI in the last 168 hours. No results for input(s): TROPIPOC in the last 168 hours.   BNPNo results for input(s): BNP, PROBNP in the last 168 hours.   DDimer No results for input(s): DDIMER in  the last 168 hours.   Radiology    No results found.  Cardiac Studies   LHC 05/02/17 Conclusion     LM lesion, 80 %stenosed.  Ost Cx to Prox Cx lesion, 95 %stenosed.  Mid Cx lesion, 70 %stenosed.  Mid LAD lesion, 30 %stenosed.  Prox LAD lesion, 80 %stenosed.  There is mild left ventricular systolic dysfunction.  LV end diastolic pressure is moderately elevated.  The left ventricular ejection fraction is 45-50% by visual estimate.   1. Severe heavily calcified calcified left main stenosis extending into the ostium of the left circumflex with significant proximal LAD stenosis. Diffuse diabetic branch disease. 2. Mildly reduced LV systolic function with an EF of 45-50% with moderate mid to distal anterior and apical hypokinesis. 3. Severely elevated systemic hypertension with moderately elevated left ventricular end-diastolic pressure.    Echocardiogram 05/03/17 Study Conclusions  - Left ventricle: The cavity size was normal. Systolic function was   normal. The estimated ejection fraction was in the range of 55%   to 60%. Wall motion was normal;  there were no regional wall   motion abnormalities. Doppler parameters are consistent with   abnormal left ventricular relaxation (grade 1 diastolic   dysfunction). - Mitral valve: Moderately calcified annulus. - Left atrium: The atrium was mildly dilated. - Pulmonary arteries: Systolic pressure was mildly increased.   Patient Profile     76 y.o. female with DM, HTN and CAD s/p recent LHC demonstrating severe multivessel disease with LM involvment, transferred from [facility] by Dr. Fletcher Anon for CABG.   Assessment & Plan    1. 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 Evaluated by Dr. Prescott Gum yesterday. CAGB recommended and tentatively planned for Friday 05/06/17. Pre-operative evaluation pending. Continue medical therapy for now. IV heparin until surgery, ASA, statin, BB and ACE-I. Echo pending. EF by cath was 45-50%. One brief episode of sharp chest pain last evening, relieved by SL NTG.    2. Mildly Reduced LV Systolic Dysfunction: Echo pending. EF by cath 45-50%. In the setting CAD. Continue Coreg and benazepril. Volume stable. Monitor.   3. DM: ACHS CBGs with SSI.  Hgb A1c 8.4. Diabetes coordinator and recommendations being followed.   4. HTN: has been elevated this admission. Most recent BP 164/78 with high of 222/78 last evening. Continue to monitor. Continue Coreg, benazepril and clonidine. Is off of her HCTZ as per home routine.  Will add amlodipine 5 mg.   5. HLD: LDL 81 mg/dL. Was on fluvastatin 40 mg outpatient. Now on Lipitor 40. Recheck FLP and HFTs in 6-8 weeks. Goal LDL <70 mg/dL. If not at goal, bump up Lipitor to 80 mg +/- Zetia.   Signed, Daune Perch, NP  05/04/2017, 9:38 AM    I have seen and examined the patient along with Daune Perch, NP .  I have reviewed the chart, notes and new data.  I agree with NP's note.  Key new complaints: transient chest discomfort last night, no angina  today Key examination changes: no  Overt HF, BP remains consistently high. Off diuretic since before cath Key new findings / data: short burst SVT on telemetry, EF normal on echo, better than LV gram and Doppler findings suggest that LV filling pressures have normalized.  PLAN: Rapid changes in LV systolic and diastolic function consistent with active ischemia at rest, unstable coronary status. Occasional chest pain at rest. Needs to remain inpatient on heparin until surgery.  Add amlodipine for BP and antianginal properties.  Sanda Klein, MD, [place] [phone] 05/04/2017, 10:35 AM

## 2017-05-05 ENCOUNTER — Inpatient Hospital Stay (HOSPITAL_COMMUNITY): Payer: Medicare Other

## 2017-05-05 DIAGNOSIS — Z01810 Encounter for preprocedural cardiovascular examination: Secondary | ICD-10-CM

## 2017-05-05 LAB — GLUCOSE, CAPILLARY
GLUCOSE-CAPILLARY: 42 mg/dL — AB (ref 65–99)
GLUCOSE-CAPILLARY: 101 mg/dL — AB (ref 65–99)
GLUCOSE-CAPILLARY: 114 mg/dL — AB (ref 65–99)
Glucose-Capillary: 137 mg/dL — ABNORMAL HIGH (ref 65–99)
Glucose-Capillary: 120 mg/dL — ABNORMAL HIGH (ref 65–99)
Glucose-Capillary: 74 mg/dL (ref 65–99)
Glucose-Capillary: 108 mg/dL — ABNORMAL HIGH (ref 65–99)

## 2017-05-05 LAB — CBC
HCT: 35 % — ABNORMAL LOW (ref 36.0–46.0)
Hemoglobin: 11.4 g/dL — ABNORMAL LOW (ref 12.0–15.0)
MCH: 28 pg (ref 26.0–34.0)
MCHC: 32.6 g/dL (ref 30.0–36.0)
MCV: 86 fL (ref 78.0–100.0)
PLATELETS: 218 10*3/uL (ref 150–400)
RBC: 4.07 MIL/uL (ref 3.87–5.11)
RDW: 13.8 % (ref 11.5–15.5)
WBC: 6.3 10*3/uL (ref 4.0–10.5)

## 2017-05-05 LAB — VAS US DOPPLER PRE CABG
LEFT ECA DIAS: -7 cm/s
LEFT VERTEBRAL DIAS: -9 cm/s
Left CCA dist dias: -19 cm/s
Left CCA dist sys: -89 cm/s
Left CCA prox dias: 11 cm/s
Left CCA prox sys: 93 cm/s
Left ICA dist dias: -32 cm/s
Left ICA dist sys: -106 cm/s
Left ICA prox dias: -23 cm/s
Left ICA prox sys: -103 cm/s
RIGHT ECA DIAS: -3 cm/s
RIGHT VERTEBRAL DIAS: -4 cm/s
Right CCA prox dias: 16 cm/s
Right CCA prox sys: 107 cm/s
Right cca dist sys: -103 cm/s

## 2017-05-05 LAB — HEPARIN LEVEL (UNFRACTIONATED): HEPARIN UNFRACTIONATED: 0.55 [IU]/mL (ref 0.30–0.70)

## 2017-05-05 MED ORDER — INSULIN ASPART 100 UNIT/ML ~~LOC~~ SOLN
0.0000 [IU] | Freq: Three times a day (TID) | SUBCUTANEOUS | Status: DC
  Administered 2017-05-06: 2 [IU] via SUBCUTANEOUS
  Administered 2017-05-06: 5 [IU] via SUBCUTANEOUS
  Administered 2017-05-07: 2 [IU] via SUBCUTANEOUS
  Administered 2017-05-07: 5 [IU] via SUBCUTANEOUS
  Administered 2017-05-08: 3 [IU] via SUBCUTANEOUS
  Administered 2017-05-08: 11 [IU] via SUBCUTANEOUS

## 2017-05-05 MED ORDER — INSULIN ASPART 100 UNIT/ML ~~LOC~~ SOLN
0.0000 [IU] | Freq: Every day | SUBCUTANEOUS | Status: DC
  Administered 2017-05-08: 2 [IU] via SUBCUTANEOUS

## 2017-05-05 NOTE — Care Management Note (Signed)
Case Management Note  Patient Details  Name: Sueellen Kayes MRN: [id number] Date of Birth: Nov 09, 1940  Subjective/Objective: Pt presented for Unstable Angina. 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 CVTS consulted and plan for CABG on Monday 05-09-17. Pt is from home with husband and PTA- pt was independent.                   Action/Plan: CM will continue to monitor for additional needs post procedure.   Expected Discharge Date:                  Expected Discharge Plan:  [place]  In-House Referral:  NA  Discharge planning Services  CM Consult  Post Acute Care Choice:    Choice offered to:     DME Arranged:    DME Agency:     HH Arranged:    HH Agency:     Status of Service:  In process, will continue to follow  If discussed at Long Length of Stay Meetings, dates discussed:    Additional Comments:  Bethena Roys, RN 05/05/2017, 4:23 PM

## 2017-05-05 NOTE — Progress Notes (Signed)
Inpatient Diabetes Program Recommendations  AACE/ADA: New Consensus Statement on Inpatient Glycemic Control (2015)  Target Ranges:  Prepandial:   less than 140 mg/dL      Peak postprandial:   less than 180 mg/dL (1-2 hours)      Critically ill patients:  140 - 180 mg/dL   Lab Results  Component Value Date   GLUCAP 114 (H) 05/05/2017   HGBA1C 8.4 (H) 05/03/2017    Review of Glycemic ControlResults for LACRESHA, FUSILIER (MRN [id number]) as of 05/05/2017 12:05  Ref. Range 05/04/2017 20:22 05/05/2017 04:01 05/05/2017 04:34 05/05/2017 07:50 05/05/2017 11:25  Glucose-Capillary Latest Ref Range: 65 - 99 mg/dL 340 (H) 42 (LL) 101 (H) 137 (H) 114 (H)  Diabetes history: Type 2 diabetes Outpatient Diabetes medications: Lantus 45 units q HS, Humalog 12-14 units bid, Humalog 75/25 19-38 units with breakfast Current orders for Inpatient glycemic control:  Novolog moderate tid with meals and HS, Lantus 25 units bid, Novolog 6 units tid with meals Inpatient Diabetes Program Recommendations:   Note low blood sugar this morning after patient received 11 units of Novolog last PM for blood sugar of 340 mg/dL.  May consider changing to HS coverage per glycemic control order set (starts at 201 mg/dL and less aggressive).  Text page sent.   Thanks, Adah Perl, RN, BC-ADM Inpatient Diabetes Coordinator Pager [phone] (8a-5p)

## 2017-05-05 NOTE — Progress Notes (Addendum)
ANTICOAGULATION CONSULT NOTE - Follow Up Consult  Pharmacy Consult for Heparin Indication: chest pain/ACS  Allergies  Allergen Reactions  . Flexeril [Cyclobenzaprine] Other (See Comments)    HBP    Patient Measurements: Height: 5\' 8"  (172.7 cm) Weight: 187 lb 9.6 oz (85.1 kg) IBW/kg (Calculated) : 63.9 Heparin Dosing Weight: 82 kg  Vital Signs: Temp: 97.9 F (36.6 C) (07/19 0516) Temp Source: Oral (07/19 0516) BP: 170/71 (07/19 0848) Pulse Rate: 69 (07/19 0803)  Labs:  Recent Labs  05/03/17 0454 05/03/17 1201 05/04/17 0228 05/05/17 0158  HGB 11.3*  --  12.0 11.4*  HCT 34.2*  --  36.4 35.0*  PLT 228  --  238 218  LABPROT 14.1  --   --   --   INR 1.09  --   --   --   HEPARINUNFRC 0.42 0.46 0.49 0.55  CREATININE 0.91  --   --   --     Estimated Creatinine Clearance: 62 mL/min (by C-G formula based on SCr of 0.91 mg/dL).  Assessment: 76 year old female presented with chest pain, now s/p cath. Pharmacy to dose heparin. Awaiting CABG Monday 05/09/17.   Heparin level is therapeutic and has been stable. CBC stable. No signs/symptoms of bleeding noted.   Goal of Therapy:  Heparin level 0.3-0.7 units/ml Monitor platelets by anticoagulation protocol: Yes   Plan:   Continue heparin drip at 950 units/hour Daily heparin level and CBC F/u CABG plans and assess heparin stop time (currently scheduled for Mon)  Bridgett Larsson, PharmD, [facility] PGY1 Pharmacy Resident Pager: [phone] 05/05/2017,10:20 AM   I discussed / reviewed the pharmacy note by Dr. Kyung Rudd and I agree with the resident's findings and plans as documented.   Sloan Leiter, PharmD, BCPS Clinical Pharmacist Clinical phone 05/05/2017 until 3:30 PM - [phone] After hours, please call [phone] 05/05/2017, 11:19 AM

## 2017-05-05 NOTE — Progress Notes (Signed)
Pre-op Cardiac Surgery  Carotid Findings: Bilateral:  1-39% ICA stenosis.  Vertebral artery flow is antegrade.      Upper Extremity Right Left  Brachial Pressures 185 Triphasic 172 Triphasic  Radial Waveforms Triphasic Triphasic  Ulnar Waveforms Triphasic Triphasic  Palmar Arch (Allen's Test) Normal Normal   Findings:  Doppler waveforms remained normal bilaterally with both radial and ulnar compressions    Lower  Extremity Right Left  Dorsalis Pedis 132 Monophasic >255 Monophasic  Posterior Tibial 78 Monophasic >255 Monophasic  Ankle/Brachial Indices 0.71 N/A   Findings :  Right ABI indicates a moderate reduction in arterial flow at rest. Left ABI could not be ascertained due to non compressible arteries possibly secondary to calcification.   [facility], [place] 05/05/17, 4:13 PM

## 2017-05-05 NOTE — Progress Notes (Signed)
CARDIAC REHAB PHASE I   PRE:  Rate/Rhythm: 65 SR  BP:  Sitting: 140/64        SaO2: 100 RA  MODE:  Ambulation: 550 ft   POST:  Rate/Rhythm: 72 SR  BP:  Sitting: 143/63         SaO2: 100 RA  Pt ambulated 550 ft on RA, IV, hand held assist, steady gait, tolerated well with no complaints. Encouraged ambulation as tolerated with staff supervision. Pt to recliner after walk, call bell within reach. Will follow.   [place], RN, BSN 05/05/2017 2:08 PM

## 2017-05-05 NOTE — Plan of Care (Signed)
Problem: Safety: Goal: Ability to remain free from injury will improve Outcome: Progressing Patient able to ambulate to the restroom, she has her call bell within easy reach and uses it to call for assistance.

## 2017-05-05 NOTE — Progress Notes (Signed)
Progress Note  Patient Name: Carrie Kane Date of Encounter: 05/05/2017  Primary Cardiologist: Dr. Rockey Situ   Subjective   No complaints. Currently chest pain-free. No dyspnea. She has been working on her incentive spirometer in preparation for surgery.  Inpatient Medications    Scheduled Meds: . amLODipine  5 mg Oral Daily  . aspirin EC  81 mg Oral Daily  . atorvastatin  40 mg Oral q1800  . benazepril  20 mg Oral Daily  . carvedilol  6.25 mg Oral BID WC  . cloNIDine  0.3 mg Oral BID  . insulin aspart  0-15 Units Subcutaneous TID WC & HS  . insulin aspart  6 Units Subcutaneous TID WC  . insulin glargine  25 Units Subcutaneous BID  . levothyroxine  100 mcg Oral QAC breakfast  . sodium chloride flush  3 mL Intravenous Q12H   Continuous Infusions: . sodium chloride    . heparin 950 Units/hr (05/05/17 1100)   PRN Meds: sodium chloride, acetaminophen, nitroGLYCERIN, ondansetron (ZOFRAN) IV, sodium chloride flush   Vital Signs    Vitals:   05/04/17 2025 05/05/17 0516 05/05/17 0803 05/05/17 0848  BP: (!) 180/81 (!) 159/68 (!) 170/71 (!) 170/71  Pulse: 63 67 69   Resp: 18 18  15   Temp: 98.2 F (36.8 C) 97.9 F (36.6 C)    TempSrc: Oral Oral    SpO2: 100% 100%    Weight:  187 lb 9.6 oz (85.1 kg)    Height:        Intake/Output Summary (Last 24 hours) at 05/05/17 1148 Last data filed at 05/05/17 0849  Gross per 24 hour  Intake            246.7 ml  Output                0 ml  Net            246.7 ml   Filed Weights   05/03/17 0524 05/04/17 0440 05/05/17 0516  Weight: 187 lb 6.4 oz (85 kg) 184 lb 11.2 oz (83.8 kg) 187 lb 9.6 oz (85.1 kg)    Telemetry    Normal sinus rhythm in the 60s with PVCs - Personally Reviewed  ECG   No new tracings.  Physical Exam  Physical Exam  Constitutional: She is oriented to person, place, and time. She appears well-developed and well-nourished. No distress.  HENT:  Head: Normocephalic and atraumatic.  Neck: No JVD present.   Cardiovascular: Normal rate, regular rhythm and normal heart sounds.  Exam reveals no gallop and no friction rub.   No murmur heard. Pulmonary/Chest: Effort normal and breath sounds normal. No respiratory distress. She has no wheezes. She has no rales.  Abdominal: Soft. Bowel sounds are normal.  Musculoskeletal: Normal range of motion. She exhibits no edema.  Neurological: She is alert and oriented to person, place, and time.  Skin: Skin is warm and dry.     Labs    Chemistry  Recent Labs Lab 04/28/17 1828 05/03/17 0454  NA 142 139  K 3.9 3.8  CL 104 105  CO2 29 28  GLUCOSE 66 148*  BUN 23* 22*  CREATININE 0.95 0.91  CALCIUM 9.9 9.2  GFRNONAA 58* >60  GFRAA >60 >60  ANIONGAP 9 6     Hematology  Recent Labs Lab 05/03/17 0454 05/04/17 0228 05/05/17 0158  WBC 6.2 7.0 6.3  RBC 4.01 4.23 4.07  HGB 11.3* 12.0 11.4*  HCT 34.2* 36.4 35.0*  MCV 85.3  86.1 86.0  MCH 28.2 28.4 28.0  MCHC 33.0 33.0 32.6  RDW 13.5 14.0 13.8  PLT 228 238 218    Cardiac EnzymesNo results for input(s): TROPONINI in the last 168 hours. No results for input(s): TROPIPOC in the last 168 hours.   BNPNo results for input(s): BNP, PROBNP in the last 168 hours.   DDimer No results for input(s): DDIMER in the last 168 hours.   Radiology    No results found.  Cardiac Studies   LHC 05/02/17 Conclusion     LM lesion, 80 %stenosed.  Ost Cx to Prox Cx lesion, 95 %stenosed.  Mid Cx lesion, 70 %stenosed.  Mid LAD lesion, 30 %stenosed.  Prox LAD lesion, 80 %stenosed.  There is mild left ventricular systolic dysfunction.  LV end diastolic pressure is moderately elevated.  The left ventricular ejection fraction is 45-50% by visual estimate.   1. Severe heavily calcified calcified left main stenosis extending into the ostium of the left circumflex with significant proximal LAD stenosis. Diffuse diabetic branch disease. 2. Mildly reduced LV systolic function with an EF of 45-50% with  moderate mid to distal anterior and apical hypokinesis. 3. Severely elevated systemic hypertension with moderately elevated left ventricular end-diastolic pressure.    Echocardiogram 05/03/17 Study Conclusions  - Left ventricle: The cavity size was normal. Systolic function was   normal. The estimated ejection fraction was in the range of 55%   to 60%. Wall motion was normal; there were no regional wall   motion abnormalities. Doppler parameters are consistent with   abnormal left ventricular relaxation (grade 1 diastolic   dysfunction). - Mitral valve: Moderately calcified annulus. - Left atrium: The atrium was mildly dilated. - Pulmonary arteries: Systolic pressure was mildly increased.   Patient Profile     76 y.o. female with DM, HTN and CAD s/p recent LHC demonstrating severe multivessel disease with LM involvment, transferred from [facility] by Dr. Fletcher Anon for CABG.   Assessment & Plan    1. 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 Evaluated by Dr. Prescott Gum . CABG recommended and tentatively planned for Monday. Pre-operative evaluation in progress. Continue medical therapy for now. IV heparin until surgery, ASA, statin, BB and ACE-I. Echo showed EF 55-60% with no RWMA, grade 1 DD. No further chest pain.   2. Mildly Reduced LV Systolic Dysfunction: EF by cath 45-50% in the setting CAD. Echo showed EF 55-60%, LV filling pressures have normalized. Continue Coreg and benazepril. Volume stable. No dyspnea, orthopnea or edema. Monitor.   3. DM: ACHS CBGs with SSI.  Hgb A1c 8.4. Diabetes coordinator and recommendations being followed. Had an episode of hypoglycemia last night and her Insulin 7030 has been decreased.  4. HTN: has been elevated this admission. Most recent BP 170/71. Continue Coreg, benazepril and clonidine. Is off of her HCTZ as per home routine.  Amlodipine 5 mg added, first dose yesterday.  Continue to monitor.  5. HLD: LDL 81 mg/dL. Was on fluvastatin 40 mg outpatient. Now on Lipitor 40. Recheck FLP and HFTs in 6-8 weeks. Goal LDL <70 mg/dL. If not at goal, bump up Lipitor to 80 mg +/- Zetia.   Signed, Daune Perch, NP  05/05/2017, 11:48 AM    I have seen and examined the patient along with Daune Perch, NP.  I have reviewed the chart, notes and new data.  I agree with PA/NP's note.  Key new complaints: had mild transient hypoglycemia  after increased dose of insulin, no angina. Key examination changes: no overt hypervolemia  PLAN: CABG next available on schedule, currently slotted for Monday.  Sanda Klein, MD, [place] [phone] 05/05/2017, 1:28 PM

## 2017-05-05 NOTE — Progress Notes (Signed)
Hypoglycemic Event  CBG: 42 at 0401  Treatment: Sprite and Graham crackers  Symptoms: diaphoresis  Follow-up CBG: Time 0434 CBG Result: 101  Possible Reasons for Event: Patient's night time CBG was 340. Scheduled to get 11 units Novolog and 25 units of Lantus. Patient did not want snack at that time.   Comments/MD notified:  Marylyn Ishihara

## 2017-05-06 DIAGNOSIS — E1165 Type 2 diabetes mellitus with hyperglycemia: Secondary | ICD-10-CM

## 2017-05-06 DIAGNOSIS — Z794 Long term (current) use of insulin: Secondary | ICD-10-CM

## 2017-05-06 DIAGNOSIS — I519 Heart disease, unspecified: Secondary | ICD-10-CM

## 2017-05-06 LAB — HEPARIN LEVEL (UNFRACTIONATED): HEPARIN UNFRACTIONATED: 0.48 [IU]/mL (ref 0.30–0.70)

## 2017-05-06 LAB — CBC
HCT: 33.5 % — ABNORMAL LOW (ref 36.0–46.0)
Hemoglobin: 10.8 g/dL — ABNORMAL LOW (ref 12.0–15.0)
MCH: 28.1 pg (ref 26.0–34.0)
MCHC: 32.2 g/dL (ref 30.0–36.0)
MCV: 87 fL (ref 78.0–100.0)
PLATELETS: 223 10*3/uL (ref 150–400)
RBC: 3.85 MIL/uL — AB (ref 3.87–5.11)
RDW: 14.1 % (ref 11.5–15.5)
WBC: 6.3 10*3/uL (ref 4.0–10.5)

## 2017-05-06 LAB — GLUCOSE, CAPILLARY
GLUCOSE-CAPILLARY: 121 mg/dL — AB (ref 65–99)
GLUCOSE-CAPILLARY: 145 mg/dL — AB (ref 65–99)
Glucose-Capillary: 105 mg/dL — ABNORMAL HIGH (ref 65–99)
Glucose-Capillary: 129 mg/dL — ABNORMAL HIGH (ref 65–99)
Glucose-Capillary: 242 mg/dL — ABNORMAL HIGH (ref 65–99)

## 2017-05-06 MED ORDER — AMLODIPINE BESYLATE 10 MG PO TABS
10.0000 mg | ORAL_TABLET | Freq: Every day | ORAL | Status: DC
  Administered 2017-05-07 – 2017-05-08 (×2): 10 mg via ORAL
  Filled 2017-05-06 (×2): qty 1

## 2017-05-06 MED ORDER — HYDRALAZINE HCL 10 MG PO TABS: 10.0000 mg | ORAL_TABLET | Freq: Three times a day (TID) | ORAL | Status: DC

## 2017-05-06 MED ORDER — HYDRALAZINE HCL 10 MG PO TABS
10.0000 mg | ORAL_TABLET | Freq: Three times a day (TID) | ORAL | Status: DC
  Administered 2017-05-07: 10 mg via ORAL
  Filled 2017-05-06: qty 1

## 2017-05-06 MED ORDER — MAGNESIUM HYDROXIDE 400 MG/5ML PO SUSP
5.0000 mL | Freq: Every day | ORAL | Status: DC | PRN
  Administered 2017-05-06: 5 mL via ORAL
  Filled 2017-05-06: qty 30

## 2017-05-06 NOTE — Progress Notes (Signed)
Procedure(s) (LRB): CORONARY ARTERY BYPASS GRAFTING (CABG) (N/A) INTRAOPERATIVE TRANSESOPHAGEAL ECHOCARDIOGRAM (N/A) Subjective: Left main and multivessel CAD Blood sugars under better control Blood pressure still remains high-we'll start low-dose oral Apresoline in a.m. Plan multivessel CABG on Monday 7-23 Preoperative Doppler show no significant carotid disease, brachial artery pressures are equal, right leg ABI 0.7  Objective: Vital signs in last 24 hours: Temp:  [97.5 F (36.4 C)-98.7 F (37.1 C)] 97.5 F (36.4 C) (07/20 1411) Pulse Rate:  [60-75] 62 (07/20 1411) Cardiac Rhythm: Normal sinus rhythm (07/20 0810) Resp:  [16] 16 (07/20 1411) BP: (143-181)/(66-93) 176/67 (07/20 1411) SpO2:  [100 %] 100 % (07/20 1411) Weight:  [188 lb 1.6 oz (85.3 kg)] 188 lb 1.6 oz (85.3 kg) (07/20 0431)  Hemodynamic parameters for last 24 hours:    Intake/Output from previous day: 07/19 0701 - 07/20 0700 In: 435.8 [P.O.:360; I.V.:75.8] Out: -  Intake/Output this shift: Total I/O In: 434.6 [P.O.:360; I.V.:74.6] Out: -     Lab Results:  Recent Labs  05/05/17 0158 05/06/17 0344  WBC 6.3 6.3  HGB 11.4* 10.8*  HCT 35.0* 33.5*  PLT 218 223   BMET: No results for input(s): NA, K, CL, CO2, GLUCOSE, BUN, CREATININE, CALCIUM in the last 72 hours.  PT/INR: No results for input(s): LABPROT, INR in the last 72 hours. ABG No results found for: PHART, HCO3, TCO2, ACIDBASEDEF, O2SAT CBG (last 3)   Recent Labs  05/06/17 0425 05/06/17 0746 05/06/17 1118  GLUCAP 105* 129* 121*    Assessment/Plan: S/P Procedure(s) (LRB): CORONARY ARTERY BYPASS GRAFTING (CABG) (N/A) INTRAOPERATIVE TRANSESOPHAGEAL ECHOCARDIOGRAM (N/A) CABG Monday 7-23   LOS: 4 days    Tharon Aquas Trigt III 05/06/2017

## 2017-05-06 NOTE — Care Management Important Message (Signed)
Important Message  Patient Details  Name: Carrie Kane MRN: [id number] Date of Birth: 06-28-1941   Medicare Important Message Given:  Yes    Nathen May 05/06/2017, 10:29 AM

## 2017-05-06 NOTE — Progress Notes (Signed)
ANTICOAGULATION CONSULT NOTE - Follow Up Consult  Pharmacy Consult for Heparin Indication: chest pain/ACS  Allergies  Allergen Reactions  . Flexeril [Cyclobenzaprine] Other (See Comments)    HBP    Patient Measurements: Height: 5\' 8"  (172.7 cm) Weight: 188 lb 1.6 oz (85.3 kg) IBW/kg (Calculated) : 63.9 Heparin Dosing Weight: 82 kg  Vital Signs: Temp: 98.7 F (37.1 C) (07/20 0431) Temp Source: Oral (07/20 0431) BP: 181/83 (07/20 0815) Pulse Rate: 70 (07/20 0815)  Labs:  Recent Labs  05/04/17 0228 05/05/17 0158 05/06/17 0344  HGB 12.0 11.4* 10.8*  HCT 36.4 35.0* 33.5*  PLT 238 218 223  HEPARINUNFRC 0.49 0.55 0.48    Estimated Creatinine Clearance: 62.1 mL/min (by C-G formula based on SCr of 0.91 mg/dL).  Assessment: 76 year old female presented with chest pain, now s/p cath. Pharmacy to dose heparin. Awaiting CABG Monday 05/09/17.   Heparin level is therapeutic and has been stable. CBC stable. No signs/symptoms of bleeding noted.   Goal of Therapy:  Heparin level 0.3-0.7 units/ml Monitor platelets by anticoagulation protocol: Yes   Plan:   Continue heparin drip at 950 units/hour Daily heparin level and CBC F/u CABG plans and assess heparin stop time (currently scheduled for Mon)  Erin Hearing PharmD., BCPS Clinical Pharmacist Pager [phone] 05/06/2017 12:10 PM

## 2017-05-06 NOTE — Progress Notes (Signed)
Progress Note  Patient Name: Carrie Kane Date of Encounter: 05/06/2017  Primary Cardiologist: Dr. Rockey Situ   Subjective   Sitting up in the chair eating breakfast. No chest pain, shortness of breath, or dizziness.  Inpatient Medications    Scheduled Meds: . amLODipine  5 mg Oral Daily  . aspirin EC  81 mg Oral Daily  . atorvastatin  40 mg Oral q1800  . benazepril  20 mg Oral Daily  . carvedilol  6.25 mg Oral BID WC  . cloNIDine  0.3 mg Oral BID  . insulin aspart  0-15 Units Subcutaneous TID WC  . insulin aspart  0-5 Units Subcutaneous QHS  . insulin aspart  6 Units Subcutaneous TID WC  . insulin glargine  25 Units Subcutaneous BID  . levothyroxine  100 mcg Oral QAC breakfast  . sodium chloride flush  3 mL Intravenous Q12H   Continuous Infusions: . sodium chloride    . heparin 950 Units/hr (05/06/17 0140)   PRN Meds: sodium chloride, acetaminophen, nitroGLYCERIN, ondansetron (ZOFRAN) IV, sodium chloride flush   Vital Signs    Vitals:   05/05/17 2139 05/05/17 2144 05/06/17 0431 05/06/17 0815  BP: (!) 177/66 (!) 177/66 (!) 153/71 (!) 181/83  Pulse:  65 60 70  Resp:  16 16   Temp:  98.2 F (36.8 C) 98.7 F (37.1 C)   TempSrc:  Oral Oral   SpO2:  100% 100%   Weight:   188 lb 1.6 oz (85.3 kg)   Height:        Intake/Output Summary (Last 24 hours) at 05/06/17 0845 Last data filed at 05/06/17 0140  Gross per 24 hour  Intake           435.83 ml  Output                0 ml  Net           435.83 ml   Filed Weights   05/04/17 0440 05/05/17 0516 05/06/17 0431  Weight: 184 lb 11.2 oz (83.8 kg) 187 lb 9.6 oz (85.1 kg) 188 lb 1.6 oz (85.3 kg)    Telemetry   Normal sinus rhythm in the low 60s - Personally Reviewed  ECG   No new tracings.  Physical Exam  Physical Exam  Constitutional: She is oriented to person, place, and time. She appears well-developed and well-nourished. No distress.  HENT:  Head: Normocephalic and atraumatic.  Neck: Normal range of  motion. Neck supple. No JVD present.  Cardiovascular: Normal rate, regular rhythm and normal heart sounds.  Exam reveals no gallop and no friction rub.   No murmur heard. Pulmonary/Chest: Effort normal and breath sounds normal. No respiratory distress. She has no wheezes. She has no rales.  Abdominal: Soft. Bowel sounds are normal.  Musculoskeletal: Normal range of motion. She exhibits no edema.  Neurological: She is alert and oriented to person, place, and time.  Skin: Skin is warm and dry.  Psychiatric: She has a normal mood and affect. Her behavior is normal.    Labs    Chemistry  Recent Labs Lab 05/03/17 0454  NA 139  K 3.8  CL 105  CO2 28  GLUCOSE 148*  BUN 22*  CREATININE 0.91  CALCIUM 9.2  GFRNONAA >60  GFRAA >60  ANIONGAP 6     Hematology  Recent Labs Lab 05/04/17 0228 05/05/17 0158 05/06/17 0344  WBC 7.0 6.3 6.3  RBC 4.23 4.07 3.85*  HGB 12.0 11.4* 10.8*  HCT 36.4 35.0*  33.5*  MCV 86.1 86.0 87.0  MCH 28.4 28.0 28.1  MCHC 33.0 32.6 32.2  RDW 14.0 13.8 14.1  PLT 238 218 223    Cardiac EnzymesNo results for input(s): TROPONINI in the last 168 hours. No results for input(s): TROPIPOC in the last 168 hours.   BNPNo results for input(s): BNP, PROBNP in the last 168 hours.   DDimer No results for input(s): DDIMER in the last 168 hours.   Radiology    No results found.  Cardiac Studies   LHC 05/02/17 Conclusion     LM lesion, 80 %stenosed.  Ost Cx to Prox Cx lesion, 95 %stenosed.  Mid Cx lesion, 70 %stenosed.  Mid LAD lesion, 30 %stenosed.  Prox LAD lesion, 80 %stenosed.  There is mild left ventricular systolic dysfunction.  LV end diastolic pressure is moderately elevated.  The left ventricular ejection fraction is 45-50% by visual estimate.   1. Severe heavily calcified calcified left main stenosis extending into the ostium of the left circumflex with significant proximal LAD stenosis. Diffuse diabetic branch disease. 2. Mildly  reduced LV systolic function with an EF of 45-50% with moderate mid to distal anterior and apical hypokinesis. 3. Severely elevated systemic hypertension with moderately elevated left ventricular end-diastolic pressure.    Echocardiogram 05/03/17 Study Conclusions  - Left ventricle: The cavity size was normal. Systolic function was   normal. The estimated ejection fraction was in the range of 55%   to 60%. Wall motion was normal; there were no regional wall   motion abnormalities. Doppler parameters are consistent with   abnormal left ventricular relaxation (grade 1 diastolic   dysfunction). - Mitral valve: Moderately calcified annulus. - Left atrium: The atrium was mildly dilated. - Pulmonary arteries: Systolic pressure was mildly increased.   Patient Profile     76 y.o. female with DM, HTN and CAD s/p recent LHC demonstrating severe multivessel disease with LM involvment, transferred from [facility] by Dr. Fletcher Anon for CABG.   Assessment & Plan    1. Unstable Angina/ CAD: LHC 05/02/17 at [facility] showed severe multivessel coronary artery disease with 80% LM lesion. Evaluated by Dr. Prescott Gum . CABG recommended and tentatively planned for Monday. Pre-operative evaluation in progress. Continue medical therapy for now. IV heparin until surgery, ASA, statin, BB and ACE-I. Echo showed EF 55-60% with no RWMA, grade 1 DD. No further chest pain.   2. Mildly Reduced LV Systolic Dysfunction: EF by cath 45-50% in the setting CAD. Echo showed EF 55-60%, LV filling pressures have normalized. Continue Coreg and benazepril. Volume stable. No dyspnea, orthopnea or edema. Monitor.   3. DM: ACHS CBGs with SSI.  Hgb A1c 8.4. Diabetes coordinator and recommendations being followed. Blood sugars have improved with fasting of 129 this am.   4. HTN: has been elevated this admission. Most recent BP 170/71. Continue Coreg, benazepril and clonidine. Is off of her HCTZ as per home routine.  Amlodipine 5 mg  added and BP continues to be high. Will increase to 10 mg.   5. HLD: LDL 81 mg/dL. Was on fluvastatin 40 mg outpatient. Now on Lipitor 40. Recheck FLP and HFTs in 6-8 weeks. Goal LDL <70 mg/dL. If not at goal, bump up Lipitor to 80 mg +/- Zetia.   Signed, Daune Perch, NP  05/06/2017, 8:45 AM    I have seen and examined the patient along with Daune Perch, NP .  I have reviewed the chart, notes and new data.  I agree with NP's note.  Key new complaints: anxious to get surgery over with Key examination changes: no overt HF, no arrhythmia. BP still high Key new findings / data: chemistry not checked since 7/17. Recheck in AM  PLAN: CABG Monday.  Sanda Klein, MD, [place] [phone] 05/06/2017, 11:09 AM

## 2017-05-06 NOTE — Progress Notes (Signed)
CARDIAC REHAB PHASE I   PRE:  Rate/Rhythm: 70 SR  BP:  Sitting: 176/67        SaO2: 100 RA  MODE:  Ambulation: 850 ft   POST:  Rate/Rhythm: 90 SR  BP:  Sitting: 190/78         SaO2: 99 RA  Pt ambulated 850 ft on RA, IV, independent (standby assist), steady gait, tolerated well with no complaints. BP somewhat elevated. Pt to recliner after walk, call bell within reach. Will follow.   [phone] Lenna Sciara, RN, BSN 05/06/2017 2:30 PM

## 2017-05-07 LAB — BASIC METABOLIC PANEL
Anion gap: 6 (ref 5–15)
BUN: 14 mg/dL (ref 6–20)
CALCIUM: 9.2 mg/dL (ref 8.9–10.3)
CO2: 26 mmol/L (ref 22–32)
CREATININE: 0.8 mg/dL (ref 0.44–1.00)
Chloride: 109 mmol/L (ref 101–111)
GFR calc Af Amer: >60 mL/min (ref >60)
GLUCOSE: 84 mg/dL (ref 65–99)
Potassium: 3.7 mmol/L (ref 3.5–5.1)
SODIUM: 141 mmol/L (ref 135–145)

## 2017-05-07 LAB — GLUCOSE, CAPILLARY
GLUCOSE-CAPILLARY: 42 mg/dL — AB (ref 65–99)
GLUCOSE-CAPILLARY: 197 mg/dL — AB (ref 65–99)
Glucose-Capillary: 123 mg/dL — ABNORMAL HIGH (ref 65–99)
Glucose-Capillary: 150 mg/dL — ABNORMAL HIGH (ref 65–99)
Glucose-Capillary: 224 mg/dL — ABNORMAL HIGH (ref 65–99)
Glucose-Capillary: 67 mg/dL (ref 65–99)

## 2017-05-07 LAB — CBC
HCT: 33.7 % — ABNORMAL LOW (ref 36.0–46.0)
Hemoglobin: 10.9 g/dL — ABNORMAL LOW (ref 12.0–15.0)
MCH: 28 pg (ref 26.0–34.0)
MCHC: 32.3 g/dL (ref 30.0–36.0)
MCV: 86.6 fL (ref 78.0–100.0)
PLATELETS: 211 10*3/uL (ref 150–400)
RBC: 3.89 MIL/uL (ref 3.87–5.11)
RDW: 13.8 % (ref 11.5–15.5)
WBC: 6.2 10*3/uL (ref 4.0–10.5)

## 2017-05-07 LAB — HEPARIN LEVEL (UNFRACTIONATED): Heparin Unfractionated: 0.46 IU/mL (ref 0.30–0.70)

## 2017-05-07 MED ORDER — HYDRALAZINE HCL 25 MG PO TABS
25.0000 mg | ORAL_TABLET | Freq: Four times a day (QID) | ORAL | Status: DC
  Administered 2017-05-07 – 2017-05-08 (×7): 25 mg via ORAL
  Filled 2017-05-07 (×7): qty 1

## 2017-05-07 MED ORDER — INSULIN GLARGINE 100 UNIT/ML ~~LOC~~ SOLN
20.0000 [IU] | Freq: Two times a day (BID) | SUBCUTANEOUS | Status: DC
  Administered 2017-05-07 – 2017-05-08 (×3): 20 [IU] via SUBCUTANEOUS
  Filled 2017-05-07 (×5): qty 0.2

## 2017-05-07 NOTE — Progress Notes (Signed)
Subjective:  No complaints of chest pain or shortness of breath.  Objective:  Vital Signs in the last 24 hours: BP (!) 170/64   Pulse (!) 58   Temp 98.6 F (37 C) (Oral)   Resp 16   Ht 5\' 8"  (1.727 m)   Wt 85.8 kg (189 lb 3.2 oz)   SpO2 100%   BMI 28.77 kg/m   Physical Exam: Pleasant obese black female in no acute distress Lungs:  Clear Cardiac:  Regular rhythm, normal S1 and S2, no S3 Extremities:  No edema present  Intake/Output from previous day: 07/20 0701 - 07/21 0700 In: 794.6 [P.O.:720; I.V.:74.6] Out: -   Weight Filed Weights   05/05/17 0516 05/06/17 0431 05/07/17 0401  Weight: 85.1 kg (187 lb 9.6 oz) 85.3 kg (188 lb 1.6 oz) 85.8 kg (189 lb 3.2 oz)    Lab Results: Basic Metabolic Panel:  Recent Labs  05/07/17 0225  NA 141  K 3.7  CL 109  CO2 26  GLUCOSE 84  BUN 14  CREATININE 0.80   CBC:  Recent Labs  05/06/17 0344 05/07/17 0225  WBC 6.3 6.2  HGB 10.8* 10.9*  HCT 33.5* 33.7*  MCV 87.0 86.6  PLT 223 211   Telemetry: Personal and reviewed.  Normal sinus rhythm.  Assessment/Plan:  1.  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  Diabetes mellitus fair control 3.  Hypertension still elevated  Recommendations:  Blood pressure remains up this morning.  Awaiting surgery on Monday without chest pain.  Add hydralazine because of reduced LV systolic function and hypertension.      Kerry Hough  MD [facility] Cardiology  05/07/2017, 9:15 AM

## 2017-05-07 NOTE — Progress Notes (Signed)
Notified Cardiology Mancel Bale with uncontrolled cbg's insulin and lantus changes made. I will continue to monitor the patient closely.  Saddie Benders RN

## 2017-05-07 NOTE — Progress Notes (Signed)
Pt c/o of diaphoresis this AM. CBG 42. Recheck CBG after snack 123. Pt diet is more controlled in hospital setting and RN has had multiple conversations initiated by pt about lifestyle changes post d/c.

## 2017-05-07 NOTE — Progress Notes (Signed)
ANTICOAGULATION CONSULT NOTE - Follow Up Consult  Pharmacy Consult for Heparin Indication: chest pain/ACS  Allergies  Allergen Reactions  . Flexeril [Cyclobenzaprine] Hypertension    Patient Measurements: Height: 5\' 8"  (172.7 cm) Weight: 189 lb 3.2 oz (85.8 kg) IBW/kg (Calculated) : 63.9 Heparin Dosing Weight: 82 kg  Vital Signs: Temp: 98.6 F (37 C) (07/21 0401) Temp Source: Oral (07/21 0401) BP: 170/64 (07/21 0908) Pulse Rate: 58 (07/21 0904)  Labs:  Recent Labs  05/05/17 0158 05/06/17 0344 05/07/17 0225  HGB 11.4* 10.8* 10.9*  HCT 35.0* 33.5* 33.7*  PLT 218 223 211  HEPARINUNFRC 0.55 0.48 0.46  CREATININE  --   --  0.80    Estimated Creatinine Clearance: 70.8 mL/min (by C-G formula based on SCr of 0.8 mg/dL).  Assessment: 76 year old female presented with chest pain, now s/p cath. Pharmacy to dose heparin. Awaiting CABG Monday 05/09/17.   Heparin level is therapeutic and has been stable. CBC stable. No signs/symptoms of bleeding noted.   Goal of Therapy:  Heparin level 0.3-0.7 units/ml Monitor platelets by anticoagulation protocol: Yes   Plan:   Continue heparin drip at 950 units/hour Daily heparin level and CBC F/u CABG plans and assess heparin stop time (currently scheduled for Mon)  Erin Hearing PharmD., BCPS Clinical Pharmacist Pager [phone] 05/07/2017 11:41 AM

## 2017-05-08 LAB — CBC
HEMATOCRIT: 33.2 % — AB (ref 36.0–46.0)
HEMOGLOBIN: 10.7 g/dL — AB (ref 12.0–15.0)
MCH: 27.7 pg (ref 26.0–34.0)
MCHC: 32.2 g/dL (ref 30.0–36.0)
MCV: 86 fL (ref 78.0–100.0)
Platelets: 222 10*3/uL (ref 150–400)
RBC: 3.86 MIL/uL — AB (ref 3.87–5.11)
RDW: 13.9 % (ref 11.5–15.5)
WBC: 7 10*3/uL (ref 4.0–10.5)

## 2017-05-08 LAB — BLOOD GAS, ARTERIAL
ACID-BASE EXCESS: 0.4 mmol/L (ref 0.0–2.0)
BICARBONATE: 24 mmol/L (ref 20.0–28.0)
Drawn by: 236041
FIO2: 21
O2 Saturation: 97.1 %
PCO2 ART: 35.3 mmHg (ref 32.0–48.0)
PH ART: 7.448 (ref 7.350–7.450)
PO2 ART: 88.2 mmHg (ref 83.0–108.0)
Patient temperature: 98.6

## 2017-05-08 LAB — GLUCOSE, CAPILLARY
GLUCOSE-CAPILLARY: 207 mg/dL — AB (ref 65–99)
Glucose-Capillary: 162 mg/dL — ABNORMAL HIGH (ref 65–99)
Glucose-Capillary: 324 mg/dL — ABNORMAL HIGH (ref 65–99)
Glucose-Capillary: 34 mg/dL — CL (ref 65–99)
Glucose-Capillary: 403 mg/dL — ABNORMAL HIGH (ref 65–99)
Glucose-Capillary: 44 mg/dL — CL (ref 65–99)
Glucose-Capillary: 83 mg/dL (ref 65–99)

## 2017-05-08 LAB — PREPARE RBC (CROSSMATCH)

## 2017-05-08 LAB — HEPARIN LEVEL (UNFRACTIONATED): Heparin Unfractionated: 0.46 IU/mL (ref 0.30–0.70)

## 2017-05-08 LAB — ABO/RH: ABO/RH(D): A POS

## 2017-05-08 MED ORDER — CHLORHEXIDINE GLUCONATE 0.12 % MT SOLN
15.0000 mL | Freq: Once | OROMUCOSAL | Status: AC
  Administered 2017-05-09: 15 mL via OROMUCOSAL
  Filled 2017-05-08: qty 15

## 2017-05-08 MED ORDER — DIAZEPAM 2 MG PO TABS
2.0000 mg | ORAL_TABLET | Freq: Once | ORAL | Status: AC
  Administered 2017-05-09: 2 mg via ORAL
  Filled 2017-05-08: qty 1

## 2017-05-08 MED ORDER — TRANEXAMIC ACID 1000 MG/10ML IV SOLN
1.5000 mg/kg/h | INTRAVENOUS | Status: DC
  Filled 2017-05-08: qty 25

## 2017-05-08 MED ORDER — TRANEXAMIC ACID (OHS) PUMP PRIME SOLUTION
2.0000 mg/kg | INTRAVENOUS | Status: DC
Start: 2017-05-09 — End: 2017-05-09
  Filled 2017-05-08: qty 1.72

## 2017-05-08 MED ORDER — METOPROLOL TARTRATE 12.5 MG HALF TABLET
12.5000 mg | ORAL_TABLET | Freq: Once | ORAL | Status: AC
  Administered 2017-05-09: 12.5 mg via ORAL
  Filled 2017-05-08: qty 1

## 2017-05-08 MED ORDER — VANCOMYCIN HCL 10 G IV SOLR
1500.0000 mg | INTRAVENOUS | Status: DC
  Filled 2017-05-08: qty 1500

## 2017-05-08 MED ORDER — DEXTROSE 5 % IV SOLN
1.5000 g | INTRAVENOUS | Status: DC
  Filled 2017-05-08: qty 1.5

## 2017-05-08 MED ORDER — TRANEXAMIC ACID (OHS) BOLUS VIA INFUSION
15.0000 mg/kg | INTRAVENOUS | Status: DC
  Filled 2017-05-08: qty 1287

## 2017-05-08 MED ORDER — EPINEPHRINE PF 1 MG/ML IJ SOLN
0.0000 ug/min | INTRAMUSCULAR | Status: DC
  Filled 2017-05-08: qty 4

## 2017-05-08 MED ORDER — CHLORHEXIDINE GLUCONATE 4 % EX LIQD
60.0000 mL | Freq: Once | CUTANEOUS | Status: AC
  Administered 2017-05-08: 4 via TOPICAL
  Filled 2017-05-08: qty 60

## 2017-05-08 MED ORDER — DEXTROSE 50 % IV SOLN
INTRAVENOUS | Status: AC
  Administered 2017-05-08: 50 mL
  Filled 2017-05-08: qty 50

## 2017-05-08 MED ORDER — DEXMEDETOMIDINE HCL IN NACL 400 MCG/100ML IV SOLN
0.1000 ug/kg/h | INTRAVENOUS | Status: DC
Start: 2017-05-09 — End: 2017-05-09
  Filled 2017-05-08: qty 100

## 2017-05-08 MED ORDER — SODIUM CHLORIDE 0.9 % IV SOLN
INTRAVENOUS | Status: DC
  Filled 2017-05-08: qty 1

## 2017-05-08 MED ORDER — TEMAZEPAM 15 MG PO CAPS: 15.0000 mg | ORAL_CAPSULE | Freq: Once | ORAL | Status: DC | PRN

## 2017-05-08 MED ORDER — POTASSIUM CHLORIDE 2 MEQ/ML IV SOLN
80.0000 meq | INTRAVENOUS | Status: DC
  Filled 2017-05-08: qty 40

## 2017-05-08 MED ORDER — MAGNESIUM SULFATE 50 % IJ SOLN
40.0000 meq | INTRAMUSCULAR | Status: DC
  Filled 2017-05-08: qty 10

## 2017-05-08 MED ORDER — NITROGLYCERIN IN D5W 200-5 MCG/ML-% IV SOLN
2.0000 ug/min | INTRAVENOUS | Status: DC
  Filled 2017-05-08: qty 250

## 2017-05-08 MED ORDER — DOPAMINE-DEXTROSE 3.2-5 MG/ML-% IV SOLN
0.0000 ug/kg/min | INTRAVENOUS | Status: DC
  Filled 2017-05-08: qty 250

## 2017-05-08 MED ORDER — DEXTROSE 5 % IV SOLN
750.0000 mg | INTRAVENOUS | Status: DC
  Filled 2017-05-08: qty 750

## 2017-05-08 MED ORDER — SODIUM CHLORIDE 0.9 % IV SOLN
30.0000 ug/min | INTRAVENOUS | Status: DC
  Filled 2017-05-08: qty 2

## 2017-05-08 MED ORDER — SODIUM CHLORIDE 0.9 % IV SOLN
INTRAVENOUS | Status: DC
  Filled 2017-05-08: qty 30

## 2017-05-08 MED ORDER — BISACODYL 5 MG PO TBEC: 5.0000 mg | DELAYED_RELEASE_TABLET | Freq: Once | ORAL | Status: DC

## 2017-05-08 MED ORDER — CHLORHEXIDINE GLUCONATE 4 % EX LIQD
60.0000 mL | Freq: Once | CUTANEOUS | Status: AC
Start: 2017-05-09 — End: 2017-05-09
  Administered 2017-05-09: 4 via TOPICAL
  Filled 2017-05-08: qty 60

## 2017-05-08 MED ORDER — ALPRAZOLAM 0.25 MG PO TABS: 0.2500 mg | ORAL_TABLET | ORAL | Status: DC | PRN

## 2017-05-08 MED ORDER — PLASMA-LYTE 148 IV SOLN
INTRAVENOUS | Status: DC
  Filled 2017-05-08: qty 2.5

## 2017-05-08 NOTE — Progress Notes (Signed)
TCTS BRIEF PROGRESS NOTE   Denies CP, SOB Hypertensive - being addressed by Dr Wynonia Lawman For OR tomorrow All questions answered  Rexene Alberts, MD 05/08/2017 9:59 AM

## 2017-05-08 NOTE — Progress Notes (Signed)
Subjective:   She had an episode of chest pain last night while blisters were in the room.  She is currently pain-free.  Denies angina today.  Anxious about surgery and questions answered.  Objective:  Vital Signs in the last 24 hours: BP (!) 139/58   Pulse 68   Temp 98.2 F (36.8 C) (Oral)   Resp 20   Ht 5\' 8"  (1.727 m)   Wt 85.8 kg (189 lb 1.6 oz)   SpO2 98%   BMI 28.75 kg/m   Physical Exam: Pleasant obese black female in no acute distress Lungs:  Clear Cardiac:  Regular rhythm, normal S1 and S2, no S3 Extremities:  No edema present  Intake/Output from previous day: 07/21 0701 - 07/22 0700 In: 1596.1 [P.O.:1500; I.V.:96.1] Out: -   Weight Filed Weights   05/06/17 0431 05/07/17 0401 05/08/17 0451  Weight: 85.3 kg (188 lb 1.6 oz) 85.8 kg (189 lb 3.2 oz) 85.8 kg (189 lb 1.6 oz)    Lab Results: Basic Metabolic Panel:  Recent Labs  05/07/17 0225  NA 141  K 3.7  CL 109  CO2 26  GLUCOSE 84  BUN 14  CREATININE 0.80   CBC:  Recent Labs  05/07/17 0225 05/08/17 0544  WBC 6.2 7.0  HGB 10.9* 10.7*  HCT 33.7* 33.2*  MCV 86.6 86.0  PLT 211 222   Telemetry: Personal and reviewed.  Normal sinus rhythm.  Assessment/Plan:  1.  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  Diabetes mellitus fair controlSome adjustments of insulin this morning. 3.  Hypertension still elevated  Recommendations:  Hydralazine was increased yesterday.  She may need a higher dose following surgery.  The blood pressure is running a little better today following the dosing increased yesterday.  Adjustments in insulin made.  Questions answered regarding surgery.      Kerry Hough  MD [facility] Cardiology  05/08/2017, 9:29 AM

## 2017-05-08 NOTE — Anesthesia Preprocedure Evaluation (Addendum)
Anesthesia Evaluation  Patient identified by MRN, date of birth, ID band Patient awake    Reviewed: Allergy & Precautions, NPO status , Patient's Chart, lab work & pertinent test results  History of Anesthesia Complications Negative for: history of anesthetic complications  Airway Mallampati: II  TM Distance: >3 FB Neck ROM: Full    Dental no notable dental hx. (+) Dental Advisory Given   Pulmonary neg pulmonary ROS,    Pulmonary exam normal        Cardiovascular hypertension, + angina + CAD  Normal cardiovascular exam   04/2017 Cath: LM 80, LAD 80p, 59m, LCX 95ost, 59m, EF 45-50%.   Neuro/Psych negative neurological ROS  negative psych ROS   GI/Hepatic negative GI ROS, Neg liver ROS,   Endo/Other  diabetes  Renal/GU      Musculoskeletal   Abdominal   Peds  Hematology negative hematology ROS (+)   Anesthesia Other Findings   Reproductive/Obstetrics                            Anesthesia Physical Anesthesia Plan  ASA: IV  Anesthesia Plan: General   Post-op Pain Management:    Induction: Intravenous  PONV Risk Score and Plan: 2 and Ondansetron, Dexamethasone and Treatment may vary due to age or medical condition  Airway Management Planned: Oral ETT  Additional Equipment: Arterial line, PA Cath and 3D TEE  Intra-op Plan:   Post-operative Plan: Post-operative intubation/ventilation  Informed Consent: I have reviewed the patients History and Physical, chart, labs and discussed the procedure including the risks, benefits and alternatives for the proposed anesthesia with the patient or authorized representative who has indicated his/her understanding and acceptance.   Dental advisory given  Plan Discussed with: Anesthesiologist, CRNA and Surgeon  Anesthesia Plan Comments:       Anesthesia Quick Evaluation

## 2017-05-08 NOTE — Progress Notes (Signed)
ANTICOAGULATION CONSULT NOTE - Follow Up Consult  Pharmacy Consult for Heparin Indication: chest pain/ACS  Allergies  Allergen Reactions  . Flexeril [Cyclobenzaprine] Hypertension    Patient Measurements: Height: 5\' 8"  (172.7 cm) Weight: 189 lb 1.6 oz (85.8 kg) IBW/kg (Calculated) : 63.9 Heparin Dosing Weight: 82 kg  Vital Signs: Temp: 98.2 F (36.8 C) (07/22 0451) Temp Source: Oral (07/22 0451) BP: 139/58 (07/22 0450) Pulse Rate: 68 (07/22 0450)  Labs:  Recent Labs  05/06/17 0344 05/07/17 0225 05/08/17 0544  HGB 10.8* 10.9* 10.7*  HCT 33.5* 33.7* 33.2*  PLT 223 211 222  HEPARINUNFRC 0.48 0.46 0.46  CREATININE  --  0.80  --     Estimated Creatinine Clearance: 70.8 mL/min (by C-G formula based on SCr of 0.8 mg/dL).  Assessment: 76 year old female presented with chest pain, now s/p cath. Pharmacy to dose heparin. Awaiting CABG Monday 05/09/17.   Heparin level is therapeutic: 0.46 and has been stable. CBC stable. No signs/symptoms of bleeding noted.   Goal of Therapy:  Heparin level 0.3-0.7 units/ml Monitor platelets by anticoagulation protocol: Yes   Plan:   Continue heparin drip at 950 units/hour  Daily heparin level and CBC   Diana L. Kyung Rudd, PharmD, [place] PGY1 Pharmacy Resident

## 2017-05-09 ENCOUNTER — Inpatient Hospital Stay (HOSPITAL_COMMUNITY): Payer: Medicare Other | Admitting: Anesthesiology

## 2017-05-09 ENCOUNTER — Inpatient Hospital Stay (HOSPITAL_COMMUNITY): Payer: Medicare Other

## 2017-05-09 ENCOUNTER — Encounter (HOSPITAL_COMMUNITY)
Admission: AD | Disposition: A | Discharge status: Home Health | Payer: Self-pay | Source: Other Acute Inpatient Hospital | Attending: Cardiovascular Disease

## 2017-05-09 DIAGNOSIS — I214 Non-ST elevation (NSTEMI) myocardial infarction: Secondary | ICD-10-CM

## 2017-05-09 DIAGNOSIS — I25110 Atherosclerotic heart disease of native coronary artery with unstable angina pectoris: Secondary | ICD-10-CM

## 2017-05-09 HISTORY — PX: CORONARY ARTERY BYPASS GRAFT: SHX141

## 2017-05-09 HISTORY — PX: INTRAOPERATIVE TRANSESOPHAGEAL ECHOCARDIOGRAM: SHX5062

## 2017-05-09 LAB — POCT I-STAT 3, ART BLOOD GAS (G3+)
Acid-base deficit: 3 mmol/L — ABNORMAL HIGH (ref 0.0–2.0)
Acid-base deficit: 1 mmol/L (ref 0.0–2.0)
BICARBONATE: 23.7 mmol/L (ref 20.0–28.0)
BICARBONATE: 24.6 mmol/L (ref 20.0–28.0)
Bicarbonate: 22.6 mmol/L (ref 20.0–28.0)
O2 SAT: 99 %
O2 Saturation: 97 %
O2 Saturation: 97 %
PCO2 ART: 40.1 mmHg (ref 32.0–48.0)
PH ART: 7.355 (ref 7.350–7.450)
PH ART: 7.381 (ref 7.350–7.450)
PO2 ART: 94 mmHg (ref 83.0–108.0)
Patient temperature: 36.1
Patient temperature: 36.4
TCO2: 25 mmol/L (ref 0–100)
TCO2: 24 mmol/L (ref 0–100)
TCO2: 26 mmol/L (ref 0–100)
pCO2 arterial: 33.3 mmHg (ref 32.0–48.0)
pCO2 arterial: 41.3 mmHg (ref 32.0–48.0)
pH, Arterial: 7.455 — ABNORMAL HIGH (ref 7.350–7.450)
pO2, Arterial: 109 mmHg — ABNORMAL HIGH (ref 83.0–108.0)
pO2, Arterial: 90 mmHg (ref 83.0–108.0)

## 2017-05-09 LAB — GLUCOSE, CAPILLARY
GLUCOSE-CAPILLARY: 105 mg/dL — AB (ref 65–99)
GLUCOSE-CAPILLARY: 110 mg/dL — AB (ref 65–99)
GLUCOSE-CAPILLARY: 148 mg/dL — AB (ref 65–99)
GLUCOSE-CAPILLARY: 152 mg/dL — AB (ref 65–99)
GLUCOSE-CAPILLARY: 173 mg/dL — AB (ref 65–99)
Glucose-Capillary: 180 mg/dL — ABNORMAL HIGH (ref 65–99)
Glucose-Capillary: 103 mg/dL — ABNORMAL HIGH (ref 65–99)
Glucose-Capillary: 105 mg/dL — ABNORMAL HIGH (ref 65–99)
Glucose-Capillary: 127 mg/dL — ABNORMAL HIGH (ref 65–99)
Glucose-Capillary: 146 mg/dL — ABNORMAL HIGH (ref 65–99)
Glucose-Capillary: 167 mg/dL — ABNORMAL HIGH (ref 65–99)

## 2017-05-09 LAB — POCT I-STAT, CHEM 8
BUN: 11 mg/dL (ref 6–20)
CHLORIDE: 108 mmol/L (ref 101–111)
CREATININE: 0.6 mg/dL (ref 0.44–1.00)
Calcium, Ion: 1.24 mmol/L (ref 1.15–1.40)
Glucose, Bld: 118 mg/dL — ABNORMAL HIGH (ref 65–99)
HEMATOCRIT: 34 % — AB (ref 36.0–46.0)
HEMOGLOBIN: 11.6 g/dL — AB (ref 12.0–15.0)
POTASSIUM: 4 mmol/L (ref 3.5–5.1)
Sodium: 141 mmol/L (ref 135–145)
TCO2: 24 mmol/L (ref 0–100)

## 2017-05-09 LAB — CBC
HCT: 34.5 % — ABNORMAL LOW (ref 36.0–46.0)
HCT: 32.3 % — ABNORMAL LOW (ref 36.0–46.0)
HCT: 27.4 % — ABNORMAL LOW (ref 36.0–46.0)
Hemoglobin: 11.3 g/dL — ABNORMAL LOW (ref 12.0–15.0)
Hemoglobin: 10.7 g/dL — ABNORMAL LOW (ref 12.0–15.0)
Hemoglobin: 9.2 g/dL — ABNORMAL LOW (ref 12.0–15.0)
MCH: 28.3 pg (ref 26.0–34.0)
MCH: 27.8 pg (ref 26.0–34.0)
MCH: 28.3 pg (ref 26.0–34.0)
MCHC: 32.8 g/dL (ref 30.0–36.0)
MCHC: 33.1 g/dL (ref 30.0–36.0)
MCHC: 33.6 g/dL (ref 30.0–36.0)
MCV: 86.3 fL (ref 78.0–100.0)
MCV: 83.9 fL (ref 78.0–100.0)
MCV: 84.3 fL (ref 78.0–100.0)
PLATELETS: 235 10*3/uL (ref 150–400)
PLATELETS: 132 10*3/uL — AB (ref 150–400)
Platelets: 138 10*3/uL — ABNORMAL LOW (ref 150–400)
RBC: 4 MIL/uL (ref 3.87–5.11)
RBC: 3.85 MIL/uL — AB (ref 3.87–5.11)
RBC: 3.25 MIL/uL — ABNORMAL LOW (ref 3.87–5.11)
RDW: 14.2 % (ref 11.5–15.5)
RDW: 14.5 % (ref 11.5–15.5)
RDW: 14.8 % (ref 11.5–15.5)
WBC: 6.5 10*3/uL (ref 4.0–10.5)
WBC: 8.7 10*3/uL (ref 4.0–10.5)
WBC: 10.2 10*3/uL (ref 4.0–10.5)

## 2017-05-09 LAB — BASIC METABOLIC PANEL
Anion gap: 7 (ref 5–15)
BUN: 14 mg/dL (ref 6–20)
CO2: 25 mmol/L (ref 22–32)
Calcium: 9.3 mg/dL (ref 8.9–10.3)
Chloride: 107 mmol/L (ref 101–111)
Creatinine, Ser: 0.93 mg/dL (ref 0.44–1.00)
GFR calc Af Amer: >60 mL/min (ref >60)
GFR calc non Af Amer: 59 mL/min — ABNORMAL LOW (ref >60)
Glucose, Bld: 173 mg/dL — ABNORMAL HIGH (ref 65–99)
Potassium: 4.3 mmol/L (ref 3.5–5.1)
Sodium: 139 mmol/L (ref 135–145)

## 2017-05-09 LAB — HEMOGLOBIN AND HEMATOCRIT, BLOOD
HCT: 22.7 % — ABNORMAL LOW (ref 36.0–46.0)
Hemoglobin: 7.6 g/dL — ABNORMAL LOW (ref 12.0–15.0)

## 2017-05-09 LAB — PLATELET COUNT: Platelets: 133 10*3/uL — ABNORMAL LOW (ref 150–400)

## 2017-05-09 LAB — POCT I-STAT 4, (NA,K, GLUC, HGB,HCT)
Glucose, Bld: 112 mg/dL — ABNORMAL HIGH (ref 65–99)
HCT: 29 % — ABNORMAL LOW (ref 36.0–46.0)
HEMOGLOBIN: 9.9 g/dL — AB (ref 12.0–15.0)
Potassium: 4.2 mmol/L (ref 3.5–5.1)
SODIUM: 142 mmol/L (ref 135–145)

## 2017-05-09 LAB — CREATININE, SERUM
Creatinine, Ser: 0.76 mg/dL (ref 0.44–1.00)
GFR calc Af Amer: >60 mL/min (ref >60)
GFR calc non Af Amer: >60 mL/min (ref >60)

## 2017-05-09 LAB — PREPARE RBC (CROSSMATCH)

## 2017-05-09 LAB — ECHO TEE
Height: 68 in
Weight: 189 oz

## 2017-05-09 LAB — MAGNESIUM: Magnesium: 2.9 mg/dL — ABNORMAL HIGH (ref 1.7–2.4)

## 2017-05-09 LAB — PROTIME-INR
INR: 1.4
PROTHROMBIN TIME: 17.2 s — AB (ref 11.4–15.2)

## 2017-05-09 LAB — HEPARIN LEVEL (UNFRACTIONATED): Heparin Unfractionated: 0.61 IU/mL (ref 0.30–0.70)

## 2017-05-09 LAB — APTT: aPTT: 27 seconds (ref 24–36)

## 2017-05-09 IMAGING — DX DG CHEST 1V PORT
1 series · 1 of 1 positions shown · non-contrast
Comparison: Chest x-ray [DATE] and CTA chest [DATE].

CLINICAL DATA: Postop day 0 CABG.

EXAM:
PORTABLE CHEST 1 VIEW

[chest]
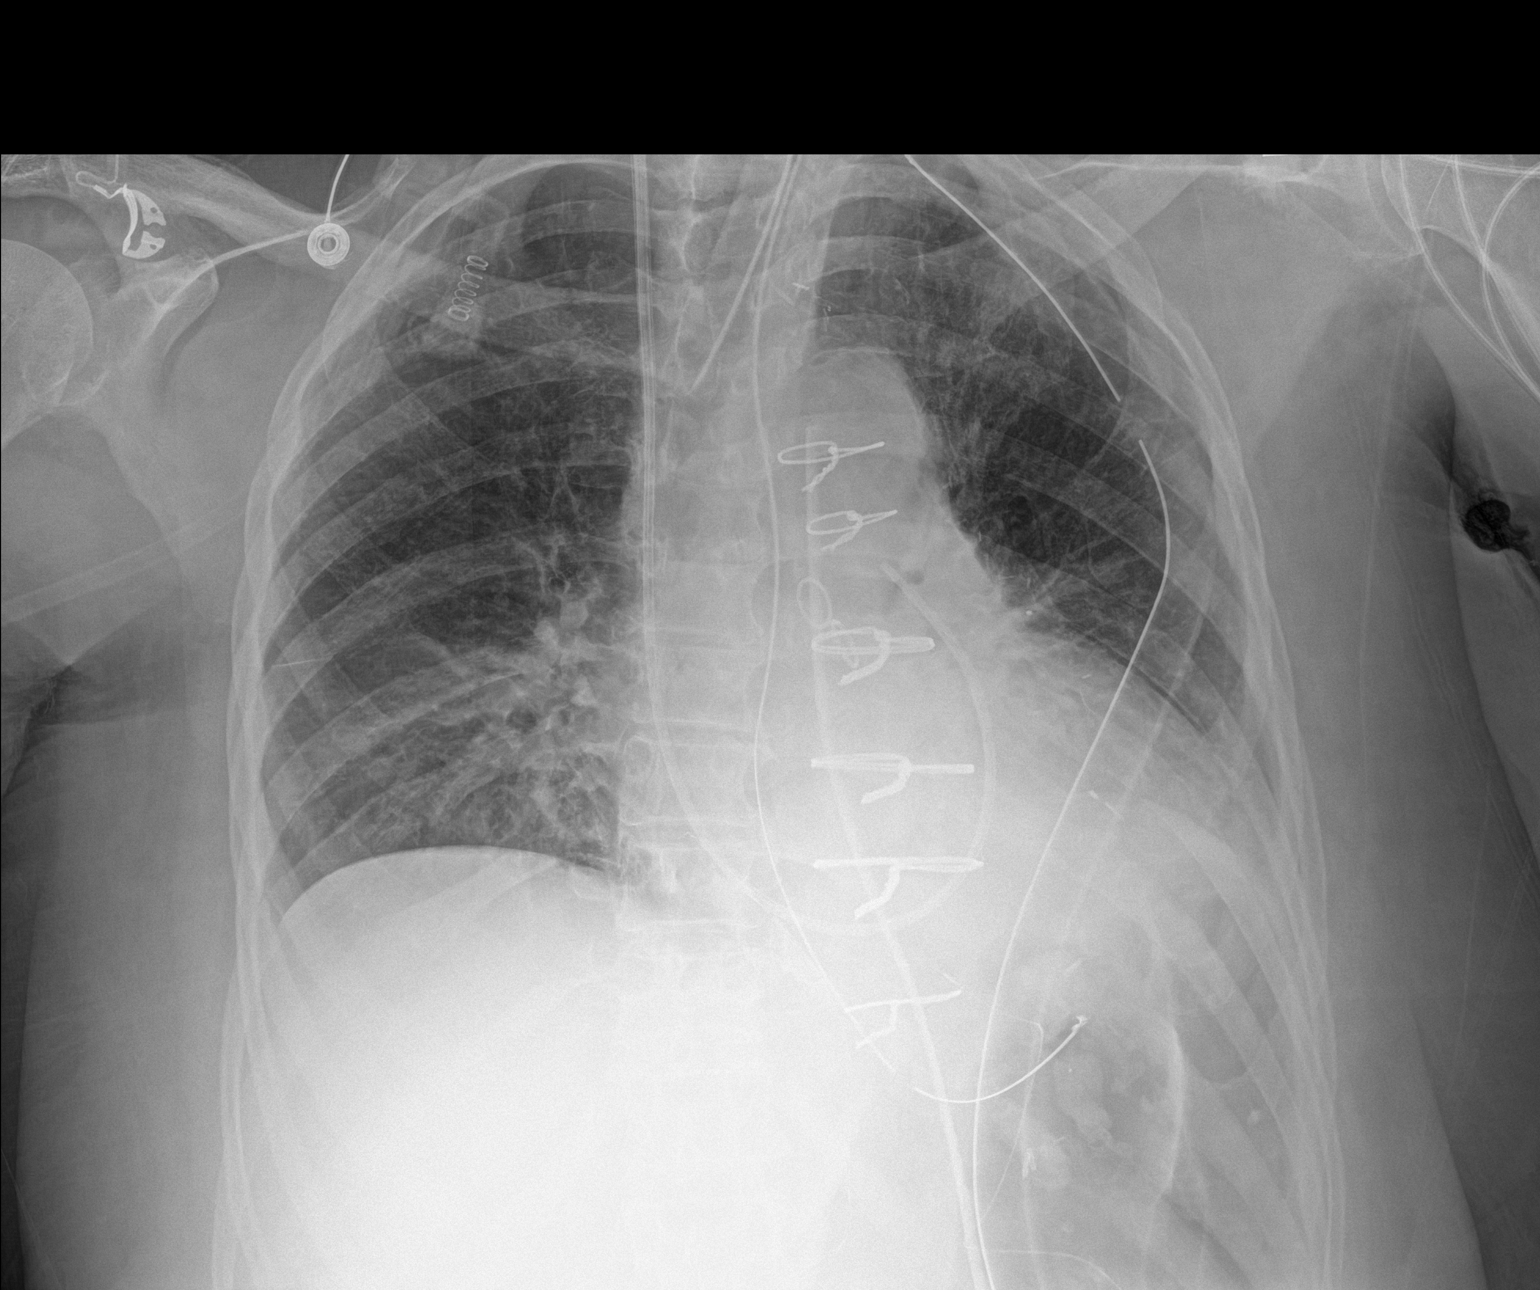

[1 of 1 positions shown; findings below may reference images not displayed]

FINDINGS: Sternotomy for CABG. Endotracheal tube tip in satisfactory position
projecting approximately 4 cm above the carina. Swan-Ganz catheter
tip projects at the expected location of the main pulmonary trunk.
Left chest tube in place with no pneumothorax. Nasogastric tube tip
at the expected location of the gastric fundus.

Cardiac silhouette mildly to moderately enlarged, unchanged. Mild
atelectasis involving the lung bases, left greater than right. Lungs
otherwise clear. Pulmonary vascularity normal without evidence of
pulmonary edema.
IMPRESSION: 1. Support apparatus satisfactory.
2. Left chest tube in place with no visible pneumothorax.
3. Bibasilar atelectasis, left greater than right. No acute
cardiopulmonary disease otherwise.

## 2017-05-09 SURGERY — CORONARY ARTERY BYPASS GRAFTING (CABG)
Anesthesia: General | Site: Chest

## 2017-05-09 MED ORDER — GELATIN ABSORBABLE MT POWD
OROMUCOSAL | Status: DC | PRN
Start: 2017-05-09 — End: 2017-05-09
  Administered 2017-05-09: 4 mL via TOPICAL

## 2017-05-09 MED ORDER — SODIUM CHLORIDE 0.9% FLUSH
3.0000 mL | Freq: Two times a day (BID) | INTRAVENOUS | Status: DC
  Administered 2017-05-10 – 2017-05-14 (×7): 3 mL via INTRAVENOUS

## 2017-05-09 MED ORDER — ROCURONIUM BROMIDE 10 MG/ML (PF) SYRINGE
PREFILLED_SYRINGE | INTRAVENOUS | Status: DC | PRN
  Administered 2017-05-09: 100 mg via INTRAVENOUS
  Administered 2017-05-09: 50 mg via INTRAVENOUS

## 2017-05-09 MED ORDER — MIDAZOLAM HCL 5 MG/5ML IJ SOLN
INTRAMUSCULAR | Status: DC | PRN
  Administered 2017-05-09 (×2): 1 mg via INTRAVENOUS

## 2017-05-09 MED ORDER — ALBUMIN HUMAN 5 % IV SOLN
12.5000 g | Freq: Once | INTRAVENOUS | Status: AC
  Administered 2017-05-09: 12.5 g via INTRAVENOUS

## 2017-05-09 MED ORDER — METOPROLOL TARTRATE 5 MG/5ML IV SOLN: 2.5000 mg | INTRAVENOUS | Status: DC | PRN

## 2017-05-09 MED ORDER — TRAMADOL HCL 50 MG PO TABS: 50.0000 mg | ORAL_TABLET | ORAL | Status: DC | PRN

## 2017-05-09 MED ORDER — DEXMEDETOMIDINE HCL IN NACL 400 MCG/100ML IV SOLN
0.1000 ug/kg/h | INTRAVENOUS | Status: AC
  Administered 2017-05-09: .5 ug/kg/h via INTRAVENOUS

## 2017-05-09 MED ORDER — EPINEPHRINE PF 1 MG/ML IJ SOLN: 0.0000 ug/min | INTRAVENOUS | Status: DC

## 2017-05-09 MED ORDER — FENTANYL CITRATE (PF) 250 MCG/5ML IJ SOLN
INTRAMUSCULAR | Status: AC
  Filled 2017-05-09: qty 10

## 2017-05-09 MED ORDER — SODIUM CHLORIDE 0.9 % IV SOLN: 250.0000 mL | INTRAVENOUS | Status: DC

## 2017-05-09 MED ORDER — ALBUMIN HUMAN 5 % IV SOLN
INTRAVENOUS | Status: AC
  Filled 2017-05-09: qty 250

## 2017-05-09 MED ORDER — LIDOCAINE HCL (CARDIAC) 20 MG/ML IV SOLN
INTRAVENOUS | Status: AC
  Filled 2017-05-09: qty 5

## 2017-05-09 MED ORDER — ACETAMINOPHEN 160 MG/5ML PO SOLN: 650.0000 mg | Freq: Once | ORAL | Status: AC

## 2017-05-09 MED ORDER — MORPHINE SULFATE (PF) 4 MG/ML IV SOLN
2.0000 mg | INTRAVENOUS | Status: DC | PRN
  Administered 2017-05-09 (×2): 2 mg via INTRAVENOUS
  Administered 2017-05-10 (×2): 4 mg via INTRAVENOUS
  Filled 2017-05-09 (×4): qty 1

## 2017-05-09 MED ORDER — METOPROLOL TARTRATE 25 MG/10 ML ORAL SUSPENSION: 12.5000 mg | Freq: Two times a day (BID) | ORAL | Status: DC

## 2017-05-09 MED ORDER — FENTANYL CITRATE (PF) 250 MCG/5ML IJ SOLN
INTRAMUSCULAR | Status: AC
  Filled 2017-05-09: qty 5

## 2017-05-09 MED ORDER — CEFUROXIME SODIUM 1.5 G IV SOLR
1.5000 g | INTRAVENOUS | Status: AC
Start: 2017-05-09 — End: 2017-05-09
  Administered 2017-05-09: .75 g via INTRAVENOUS
  Administered 2017-05-09: 1.5 g via INTRAVENOUS

## 2017-05-09 MED ORDER — VANCOMYCIN HCL IN DEXTROSE 1-5 GM/200ML-% IV SOLN
1000.0000 mg | Freq: Once | INTRAVENOUS | Status: AC
  Administered 2017-05-09: 1000 mg via INTRAVENOUS
  Filled 2017-05-09: qty 200

## 2017-05-09 MED ORDER — LACTATED RINGERS IV SOLN: INTRAVENOUS | Status: DC

## 2017-05-09 MED ORDER — DOPAMINE-DEXTROSE 3.2-5 MG/ML-% IV SOLN: 0.0000 ug/kg/min | INTRAVENOUS | Status: DC

## 2017-05-09 MED ORDER — LACTATED RINGERS IV SOLN
INTRAVENOUS | Status: DC
  Administered 2017-05-10: 05:00:00 via INTRAVENOUS

## 2017-05-09 MED ORDER — ASPIRIN 81 MG PO CHEW
324.0000 mg | CHEWABLE_TABLET | Freq: Every day | ORAL | Status: DC
  Administered 2017-05-15: 324 mg
  Filled 2017-05-09 (×2): qty 4

## 2017-05-09 MED ORDER — PROPOFOL 10 MG/ML IV BOLUS
INTRAVENOUS | Status: AC
  Filled 2017-05-09: qty 20

## 2017-05-09 MED ORDER — ASPIRIN EC 325 MG PO TBEC
325.0000 mg | DELAYED_RELEASE_TABLET | Freq: Every day | ORAL | Status: DC
  Administered 2017-05-10 – 2017-05-14 (×5): 325 mg via ORAL
  Filled 2017-05-09 (×6): qty 1

## 2017-05-09 MED ORDER — SODIUM CHLORIDE 0.9 % IJ SOLN
OROMUCOSAL | Status: DC | PRN
  Administered 2017-05-09: 4 mL via TOPICAL

## 2017-05-09 MED ORDER — TRANEXAMIC ACID (OHS) BOLUS VIA INFUSION
15.0000 mg/kg | INTRAVENOUS | Status: AC
  Administered 2017-05-09: 1287 mg via INTRAVENOUS
  Filled 2017-05-09: qty 1287

## 2017-05-09 MED ORDER — LACTATED RINGERS IV SOLN
INTRAVENOUS | Status: DC | PRN
  Administered 2017-05-09 (×2): via INTRAVENOUS

## 2017-05-09 MED ORDER — ALBUMIN HUMAN 5 % IV SOLN
250.0000 mL | INTRAVENOUS | Status: AC | PRN
  Administered 2017-05-09 (×4): 250 mL via INTRAVENOUS
  Filled 2017-05-09: qty 250

## 2017-05-09 MED ORDER — MILRINONE LACTATE IN DEXTROSE 20-5 MG/100ML-% IV SOLN
0.1250 ug/kg/min | INTRAVENOUS | Status: DC
  Administered 2017-05-09: 0.3 ug/kg/min via INTRAVENOUS
  Administered 2017-05-10: 0.125 ug/kg/min via INTRAVENOUS
  Filled 2017-05-09 (×2): qty 100

## 2017-05-09 MED ORDER — ACETAMINOPHEN 160 MG/5ML PO SOLN: 1000.0000 mg | Freq: Four times a day (QID) | ORAL | Status: AC

## 2017-05-09 MED ORDER — LACTATED RINGERS IV SOLN: 500.0000 mL | Freq: Once | INTRAVENOUS | Status: DC | PRN

## 2017-05-09 MED ORDER — SODIUM CHLORIDE 0.9 % IV SOLN
30.0000 ug/min | INTRAVENOUS | Status: AC
  Administered 2017-05-09: 25 ug/min via INTRAVENOUS

## 2017-05-09 MED ORDER — MIDAZOLAM HCL 2 MG/2ML IJ SOLN: 2.0000 mg | INTRAMUSCULAR | Status: DC | PRN

## 2017-05-09 MED ORDER — ACETAMINOPHEN 500 MG PO TABS
1000.0000 mg | ORAL_TABLET | Freq: Four times a day (QID) | ORAL | Status: AC
  Administered 2017-05-09 – 2017-05-14 (×18): 1000 mg via ORAL
  Filled 2017-05-09 (×18): qty 2

## 2017-05-09 MED ORDER — CEFUROXIME SODIUM 750 MG IJ SOLR: 750.0000 mg | INTRAMUSCULAR | Status: DC

## 2017-05-09 MED ORDER — METOCLOPRAMIDE HCL 5 MG/ML IJ SOLN
10.0000 mg | Freq: Four times a day (QID) | INTRAMUSCULAR | Status: DC
  Administered 2017-05-09 – 2017-05-13 (×14): 10 mg via INTRAVENOUS
  Filled 2017-05-09 (×14): qty 2

## 2017-05-09 MED ORDER — SODIUM CHLORIDE 0.9 % IV SOLN
INTRAVENOUS | Status: AC
Start: 2017-05-09 — End: 2017-05-09
  Administered 2017-05-09: 1 [IU]/h via INTRAVENOUS

## 2017-05-09 MED ORDER — MILRINONE LACTATE IN DEXTROSE 20-5 MG/100ML-% IV SOLN
0.1250 ug/kg/min | INTRAVENOUS | Status: AC
  Administered 2017-05-09: 0.25 ug/kg/min via INTRAVENOUS
  Filled 2017-05-09: qty 100

## 2017-05-09 MED ORDER — MAGNESIUM SULFATE 4 GM/100ML IV SOLN
4.0000 g | Freq: Once | INTRAVENOUS | Status: AC
  Administered 2017-05-09: 4 g via INTRAVENOUS
  Filled 2017-05-09: qty 100

## 2017-05-09 MED ORDER — NITROGLYCERIN IN D5W 200-5 MCG/ML-% IV SOLN
2.0000 ug/min | INTRAVENOUS | Status: AC
  Administered 2017-05-09: 10 ug/min via INTRAVENOUS

## 2017-05-09 MED ORDER — BISACODYL 10 MG RE SUPP: 10.0000 mg | Freq: Every day | RECTAL | Status: DC

## 2017-05-09 MED ORDER — SODIUM CHLORIDE 0.9% FLUSH: 3.0000 mL | INTRAVENOUS | Status: DC | PRN

## 2017-05-09 MED ORDER — LACTATED RINGERS IV SOLN
INTRAVENOUS | Status: DC | PRN
  Administered 2017-05-09: 11:00:00 via INTRAVENOUS

## 2017-05-09 MED ORDER — PROTAMINE SULFATE 10 MG/ML IV SOLN
INTRAVENOUS | Status: DC | PRN
  Administered 2017-05-09: 240 mg via INTRAVENOUS
  Administered 2017-05-09: 10 mg via INTRAVENOUS

## 2017-05-09 MED ORDER — HEPARIN SODIUM (PORCINE) 1000 UNIT/ML IJ SOLN
INTRAMUSCULAR | Status: DC | PRN
  Administered 2017-05-09: 23000 [IU] via INTRAVENOUS
  Administered 2017-05-09: 2000 [IU] via INTRAVENOUS

## 2017-05-09 MED ORDER — HEPARIN SODIUM (PORCINE) 1000 UNIT/ML IJ SOLN
INTRAMUSCULAR | Status: AC
  Filled 2017-05-09: qty 1

## 2017-05-09 MED ORDER — PHENYLEPHRINE HCL 10 MG/ML IJ SOLN
INTRAMUSCULAR | Status: DC | PRN
  Administered 2017-05-09: 10 ug/min via INTRAVENOUS

## 2017-05-09 MED ORDER — PROTAMINE SULFATE 10 MG/ML IV SOLN
INTRAVENOUS | Status: AC
  Filled 2017-05-09: qty 25

## 2017-05-09 MED ORDER — LIDOCAINE 2% (20 MG/ML) 5 ML SYRINGE
INTRAMUSCULAR | Status: DC | PRN
  Administered 2017-05-09: 100 mg via INTRAVENOUS

## 2017-05-09 MED ORDER — PANTOPRAZOLE SODIUM 40 MG PO TBEC
40.0000 mg | DELAYED_RELEASE_TABLET | Freq: Every day | ORAL | Status: DC
  Administered 2017-05-11 – 2017-05-15 (×5): 40 mg via ORAL
  Filled 2017-05-09 (×4): qty 1

## 2017-05-09 MED ORDER — PLASMA-LYTE 148 IV SOLN
INTRAVENOUS | Status: AC
  Administered 2017-05-09: 500 mL

## 2017-05-09 MED ORDER — ONDANSETRON HCL 4 MG/2ML IJ SOLN: 4.0000 mg | Freq: Four times a day (QID) | INTRAMUSCULAR | Status: DC | PRN

## 2017-05-09 MED ORDER — MIDAZOLAM HCL 2 MG/2ML IJ SOLN
INTRAMUSCULAR | Status: AC
  Filled 2017-05-09: qty 2

## 2017-05-09 MED ORDER — FAMOTIDINE IN NACL 20-0.9 MG/50ML-% IV SOLN
20.0000 mg | Freq: Two times a day (BID) | INTRAVENOUS | Status: AC
  Administered 2017-05-09 (×2): 20 mg via INTRAVENOUS
  Filled 2017-05-09: qty 50

## 2017-05-09 MED ORDER — ORAL CARE MOUTH RINSE
15.0000 mL | Freq: Two times a day (BID) | OROMUCOSAL | Status: DC
  Administered 2017-05-10 – 2017-05-15 (×7): 15 mL via OROMUCOSAL

## 2017-05-09 MED ORDER — SODIUM CHLORIDE 0.9 % IV SOLN
0.0000 ug/kg/h | INTRAVENOUS | Status: DC
  Filled 2017-05-09 (×2): qty 2

## 2017-05-09 MED ORDER — VANCOMYCIN HCL 10 G IV SOLR
1500.0000 mg | INTRAVENOUS | Status: AC
  Administered 2017-05-09: 1500 mg via INTRAVENOUS

## 2017-05-09 MED ORDER — SODIUM CHLORIDE 0.9 % IV SOLN
0.0000 ug/min | INTRAVENOUS | Status: DC
  Filled 2017-05-09: qty 2

## 2017-05-09 MED ORDER — MORPHINE SULFATE (PF) 4 MG/ML IV SOLN
1.0000 mg | INTRAVENOUS | Status: AC | PRN
  Administered 2017-05-09: 2 mg via INTRAVENOUS
  Administered 2017-05-10: 4 mg via INTRAVENOUS
  Filled 2017-05-09 (×2): qty 1

## 2017-05-09 MED ORDER — FENTANYL CITRATE (PF) 250 MCG/5ML IJ SOLN
INTRAMUSCULAR | Status: DC | PRN
Start: 2017-05-09 — End: 2017-05-09
  Administered 2017-05-09 (×2): 250 ug via INTRAVENOUS
  Administered 2017-05-09: 100 ug via INTRAVENOUS
  Administered 2017-05-09: 50 ug via INTRAVENOUS
  Administered 2017-05-09: 250 ug via INTRAVENOUS
  Administered 2017-05-09: 150 ug via INTRAVENOUS
  Administered 2017-05-09 (×2): 100 ug via INTRAVENOUS

## 2017-05-09 MED ORDER — DEXTROSE 5 % IV SOLN
1.5000 g | Freq: Two times a day (BID) | INTRAVENOUS | Status: AC
  Administered 2017-05-09 – 2017-05-11 (×4): 1.5 g via INTRAVENOUS
  Filled 2017-05-09 (×5): qty 1.5

## 2017-05-09 MED ORDER — TRANEXAMIC ACID 1000 MG/10ML IV SOLN
1.5000 mg/kg/h | INTRAVENOUS | Status: AC
Start: 2017-05-09 — End: 2017-05-09
  Administered 2017-05-09: 1.5 mg/kg/h via INTRAVENOUS

## 2017-05-09 MED ORDER — METOPROLOL TARTRATE 12.5 MG HALF TABLET
12.5000 mg | ORAL_TABLET | Freq: Two times a day (BID) | ORAL | Status: DC
  Administered 2017-05-10 – 2017-05-13 (×8): 12.5 mg via ORAL
  Filled 2017-05-09 (×8): qty 1

## 2017-05-09 MED ORDER — HEMOSTATIC AGENTS (NO CHARGE) OPTIME
TOPICAL | Status: DC | PRN
  Administered 2017-05-09: 1 via TOPICAL

## 2017-05-09 MED ORDER — BISACODYL 5 MG PO TBEC
10.0000 mg | DELAYED_RELEASE_TABLET | Freq: Every day | ORAL | Status: DC
  Administered 2017-05-10 – 2017-05-15 (×6): 10 mg via ORAL
  Filled 2017-05-09 (×7): qty 2

## 2017-05-09 MED ORDER — LACTATED RINGERS IV SOLN
INTRAVENOUS | Status: DC | PRN
  Administered 2017-05-09: 07:00:00 via INTRAVENOUS

## 2017-05-09 MED ORDER — MAGNESIUM SULFATE 50 % IJ SOLN: 40.0000 meq | INTRAMUSCULAR | Status: DC

## 2017-05-09 MED ORDER — SODIUM CHLORIDE 0.45 % IV SOLN: INTRAVENOUS | Status: DC | PRN

## 2017-05-09 MED ORDER — TRANEXAMIC ACID (OHS) PUMP PRIME SOLUTION: 2.0000 mg/kg | INTRAVENOUS | Status: DC

## 2017-05-09 MED ORDER — SODIUM CHLORIDE 0.9 % IV SOLN: INTRAVENOUS | Status: DC

## 2017-05-09 MED ORDER — NITROGLYCERIN IN D5W 200-5 MCG/ML-% IV SOLN
0.0000 ug/min | INTRAVENOUS | Status: DC
  Administered 2017-05-10: 55 ug/min via INTRAVENOUS
  Filled 2017-05-09: qty 250

## 2017-05-09 MED ORDER — ROCURONIUM BROMIDE 10 MG/ML (PF) SYRINGE
PREFILLED_SYRINGE | INTRAVENOUS | Status: AC
  Filled 2017-05-09: qty 10

## 2017-05-09 MED ORDER — OXYCODONE HCL 5 MG PO TABS
5.0000 mg | ORAL_TABLET | ORAL | Status: DC | PRN
  Administered 2017-05-10: 10 mg via ORAL
  Administered 2017-05-10: 5 mg via ORAL
  Administered 2017-05-11: 10 mg via ORAL
  Filled 2017-05-09 (×2): qty 2
  Filled 2017-05-09: qty 1

## 2017-05-09 MED ORDER — POTASSIUM CHLORIDE 10 MEQ/50ML IV SOLN: 10.0000 meq | INTRAVENOUS | Status: AC

## 2017-05-09 MED ORDER — DOCUSATE SODIUM 100 MG PO CAPS
200.0000 mg | ORAL_CAPSULE | Freq: Every day | ORAL | Status: DC
  Administered 2017-05-10 – 2017-05-15 (×5): 200 mg via ORAL
  Filled 2017-05-09 (×5): qty 2

## 2017-05-09 MED ORDER — INSULIN REGULAR BOLUS VIA INFUSION
0.0000 [IU] | Freq: Three times a day (TID) | INTRAVENOUS | Status: DC
  Filled 2017-05-09: qty 10

## 2017-05-09 MED ORDER — PROPOFOL 10 MG/ML IV BOLUS
INTRAVENOUS | Status: DC | PRN
  Administered 2017-05-09: 100 mg via INTRAVENOUS

## 2017-05-09 MED ORDER — CHLORHEXIDINE GLUCONATE 0.12 % MT SOLN
15.0000 mL | OROMUCOSAL | Status: AC
  Administered 2017-05-09: 15 mL via OROMUCOSAL

## 2017-05-09 MED ORDER — SODIUM CHLORIDE 0.9 % IV SOLN: 10.0000 mL/h | Freq: Once | INTRAVENOUS | Status: DC

## 2017-05-09 MED ORDER — 0.9 % SODIUM CHLORIDE (POUR BTL) OPTIME
TOPICAL | Status: DC | PRN
  Administered 2017-05-09: 6000 mL

## 2017-05-09 MED ORDER — ACETAMINOPHEN 650 MG RE SUPP
650.0000 mg | Freq: Once | RECTAL | Status: AC
  Administered 2017-05-09: 650 mg via RECTAL

## 2017-05-09 MED ORDER — SODIUM CHLORIDE 0.9 % IV SOLN
INTRAVENOUS | Status: DC
  Administered 2017-05-09: 3.7 [IU]/h via INTRAVENOUS
  Filled 2017-05-09: qty 1

## 2017-05-09 MED ORDER — POTASSIUM CHLORIDE 2 MEQ/ML IV SOLN: 80.0000 meq | INTRAVENOUS | Status: DC

## 2017-05-09 SURGICAL SUPPLY — 111 items
ADAPTER CARDIO PERF ANTE/RETRO (ADAPTER) ×3 IMPLANT
APPLICATOR COTTON TIP 6IN STRL (MISCELLANEOUS) ×3 IMPLANT
BAG DECANTER FOR FLEXI CONT (MISCELLANEOUS) ×3 IMPLANT
BANDAGE ACE 4X5 VEL STRL LF (GAUZE/BANDAGES/DRESSINGS) ×3 IMPLANT
BANDAGE ACE 6X5 VEL STRL LF (GAUZE/BANDAGES/DRESSINGS) ×3 IMPLANT
BASKET HEART  (ORDER IN 25'S) (MISCELLANEOUS) ×1
BASKET HEART (ORDER IN 25'S) (MISCELLANEOUS) ×1
BASKET HEART (ORDER IN 25S) (MISCELLANEOUS) ×1 IMPLANT
BLADE CLIPPER SURG (BLADE) IMPLANT
BLADE STERNUM SYSTEM 6 (BLADE) ×3 IMPLANT
BLADE SURG 11 STRL SS (BLADE) ×3 IMPLANT
BLADE SURG 12 STRL SS (BLADE) ×3 IMPLANT
BNDG GAUZE ELAST 4 BULKY (GAUZE/BANDAGES/DRESSINGS) ×3 IMPLANT
CANISTER SUCT 3000ML PPV (MISCELLANEOUS) ×3 IMPLANT
CANNULA GUNDRY RCSP 15FR (MISCELLANEOUS) ×3 IMPLANT
CATH CPB KIT VANTRIGT (MISCELLANEOUS) ×3 IMPLANT
CATH ROBINSON RED A/P 18FR (CATHETERS) ×9 IMPLANT
CATH THORACIC 36FR RT ANG (CATHETERS) ×3 IMPLANT
CRADLE DONUT ADULT HEAD (MISCELLANEOUS) ×3 IMPLANT
DERMABOND ADVANCED (GAUZE/BANDAGES/DRESSINGS) ×2
DERMABOND ADVANCED .7 DNX12 (GAUZE/BANDAGES/DRESSINGS) ×1 IMPLANT
DRAIN CHANNEL 32F RND 10.7 FF (WOUND CARE) ×3 IMPLANT
DRAPE CARDIOVASCULAR INCISE (DRAPES) ×2
DRAPE SLUSH/WARMER DISC (DRAPES) ×3 IMPLANT
DRAPE SRG 135X102X78XABS (DRAPES) ×1 IMPLANT
DRSG AQUACEL AG ADV 3.5X14 (GAUZE/BANDAGES/DRESSINGS) ×3 IMPLANT
ELECT BLADE 4.0 EZ CLEAN MEGAD (MISCELLANEOUS) ×3
ELECT BLADE 6.5 EXT (BLADE) ×3 IMPLANT
ELECT CAUTERY BLADE 6.4 (BLADE) ×3 IMPLANT
ELECT REM PT RETURN 9FT ADLT (ELECTROSURGICAL) ×6
ELECTRODE BLDE 4.0 EZ CLN MEGD (MISCELLANEOUS) ×1 IMPLANT
ELECTRODE REM PT RTRN 9FT ADLT (ELECTROSURGICAL) ×2 IMPLANT
FELT TEFLON 1X6 (MISCELLANEOUS) ×6 IMPLANT
GAUZE SPONGE 4X4 12PLY STRL (GAUZE/BANDAGES/DRESSINGS) ×6 IMPLANT
GAUZE SPONGE 4X4 12PLY STRL LF (GAUZE/BANDAGES/DRESSINGS) ×6 IMPLANT
GLOVE BIO SURGEON STRL SZ7.5 (GLOVE) ×9 IMPLANT
GLOVE BIOGEL M 6.5 STRL (GLOVE) ×12 IMPLANT
GLOVE BIOGEL M STER SZ 6 (GLOVE) ×6 IMPLANT
GLOVE BIOGEL PI IND STRL 6 (GLOVE) ×1 IMPLANT
GLOVE BIOGEL PI IND STRL 6.5 (GLOVE) ×4 IMPLANT
GLOVE BIOGEL PI INDICATOR 6 (GLOVE) ×2
GLOVE BIOGEL PI INDICATOR 6.5 (GLOVE) ×8
GOWN STRL REUS W/ TWL LRG LVL3 (GOWN DISPOSABLE) ×8 IMPLANT
GOWN STRL REUS W/TWL LRG LVL3 (GOWN DISPOSABLE) ×16
HEMOSTAT POWDER SURGIFOAM 1G (HEMOSTASIS) ×9 IMPLANT
HEMOSTAT SURGICEL 2X14 (HEMOSTASIS) ×3 IMPLANT
INSERT FOGARTY XLG (MISCELLANEOUS) IMPLANT
KIT BASIN OR (CUSTOM PROCEDURE TRAY) ×3 IMPLANT
KIT ROOM TURNOVER OR (KITS) ×3 IMPLANT
KIT SUCTION CATH 14FR (SUCTIONS) ×3 IMPLANT
KIT VASOVIEW HEMOPRO VH 3000 (KITS) ×3 IMPLANT
LEAD PACING MYOCARDI (MISCELLANEOUS) ×3 IMPLANT
LINE VENT (MISCELLANEOUS) ×3 IMPLANT
MARKER GRAFT CORONARY BYPASS (MISCELLANEOUS) ×9 IMPLANT
NS IRRIG 1000ML POUR BTL (IV SOLUTION) ×15 IMPLANT
PACK OPEN HEART (CUSTOM PROCEDURE TRAY) ×3 IMPLANT
PAD ARMBOARD 7.5X6 YLW CONV (MISCELLANEOUS) ×6 IMPLANT
PAD ELECT DEFIB RADIOL ZOLL (MISCELLANEOUS) ×3 IMPLANT
PENCIL BUTTON HOLSTER BLD 10FT (ELECTRODE) ×3 IMPLANT
PUNCH AORTIC ROTATE  4.5MM 8IN (MISCELLANEOUS) ×3 IMPLANT
PUNCH AORTIC ROTATE 4.0MM (MISCELLANEOUS) IMPLANT
PUNCH AORTIC ROTATE 4.5MM 8IN (MISCELLANEOUS) IMPLANT
PUNCH AORTIC ROTATE 5MM 8IN (MISCELLANEOUS) IMPLANT
SET CARDIOPLEGIA MPS 5001102 (MISCELLANEOUS) ×3 IMPLANT
SOLUTION ANTI FOG 6CC (MISCELLANEOUS) ×3 IMPLANT
SPONGE LAP 18X18 X RAY DECT (DISPOSABLE) ×3 IMPLANT
SURGIFLO W/THROMBIN 8M KIT (HEMOSTASIS) ×6 IMPLANT
SUT BONE WAX W31G (SUTURE) ×3 IMPLANT
SUT ETHIBOND 2 0 SH (SUTURE) ×8
SUT ETHIBOND 2 0 SH 36X2 (SUTURE) ×4 IMPLANT
SUT MNCRL AB 4-0 PS2 18 (SUTURE) ×3 IMPLANT
SUT PROLENE 3 0 SH DA (SUTURE) IMPLANT
SUT PROLENE 3 0 SH1 36 (SUTURE) IMPLANT
SUT PROLENE 4 0 RB 1 (SUTURE) ×4
SUT PROLENE 4 0 SH DA (SUTURE) ×3 IMPLANT
SUT PROLENE 4-0 RB1 .5 CRCL 36 (SUTURE) ×2 IMPLANT
SUT PROLENE 5 0 C 1 36 (SUTURE) ×6 IMPLANT
SUT PROLENE 6 0 C 1 30 (SUTURE) ×6 IMPLANT
SUT PROLENE 6 0 CC (SUTURE) ×9 IMPLANT
SUT PROLENE 8 0 BV175 6 (SUTURE) ×6 IMPLANT
SUT PROLENE BLUE 7 0 (SUTURE) ×6 IMPLANT
SUT SILK  1 MH (SUTURE) ×6
SUT SILK 1 MH (SUTURE) ×3 IMPLANT
SUT SILK 1 TIES 10X30 (SUTURE) ×3 IMPLANT
SUT SILK 2 0 SH CR/8 (SUTURE) ×6 IMPLANT
SUT SILK 2 0 TIES 10X30 (SUTURE) ×3 IMPLANT
SUT SILK 2 0 TIES 17X18 (SUTURE) ×2
SUT SILK 2-0 18XBRD TIE BLK (SUTURE) ×1 IMPLANT
SUT SILK 3 0 SH CR/8 (SUTURE) ×3 IMPLANT
SUT SILK 4 0 TIE 10X30 (SUTURE) ×6 IMPLANT
SUT STEEL 6MS V (SUTURE) ×3 IMPLANT
SUT STEEL SZ 6 DBL 3X14 BALL (SUTURE) ×3 IMPLANT
SUT TEM PAC WIRE 2 0 SH (SUTURE) ×6 IMPLANT
SUT VIC AB 1 CTX 36 (SUTURE) ×4
SUT VIC AB 1 CTX36XBRD ANBCTR (SUTURE) ×2 IMPLANT
SUT VIC AB 2-0 CT1 27 (SUTURE) ×2
SUT VIC AB 2-0 CT1 TAPERPNT 27 (SUTURE) ×1 IMPLANT
SUT VIC AB 2-0 CTX 27 (SUTURE) ×6 IMPLANT
SUT VIC AB 3-0 X1 27 (SUTURE) ×6 IMPLANT
SUTURE E-PAK OPEN HEART (SUTURE) ×3 IMPLANT
SYSTEM SAHARA CHEST DRAIN ATS (WOUND CARE) ×3 IMPLANT
TAPE CLOTH SURG 4X10 WHT LF (GAUZE/BANDAGES/DRESSINGS) ×3 IMPLANT
TAPE PAPER 2X10 WHT MICROPORE (GAUZE/BANDAGES/DRESSINGS) ×3 IMPLANT
TOWEL GREEN STERILE (TOWEL DISPOSABLE) ×12 IMPLANT
TOWEL GREEN STERILE FF (TOWEL DISPOSABLE) ×6 IMPLANT
TOWEL OR 17X24 6PK STRL BLUE (TOWEL DISPOSABLE) ×6 IMPLANT
TOWEL OR 17X26 10 PK STRL BLUE (TOWEL DISPOSABLE) ×6 IMPLANT
TRAY FOLEY SILVER 16FR TEMP (SET/KITS/TRAYS/PACK) ×3 IMPLANT
TUBING INSUFFLATION (TUBING) ×3 IMPLANT
UNDERPAD 30X30 (UNDERPADS AND DIAPERS) ×3 IMPLANT
WATER STERILE IRR 1000ML POUR (IV SOLUTION) ×6 IMPLANT

## 2017-05-09 NOTE — Brief Op Note (Signed)
05/02/2017 - 05/09/2017  11:34 AM  PATIENT:  Carrie Kane  76 y.o. female  PRE-OPERATIVE DIAGNOSIS:  CAD  POST-OPERATIVE DIAGNOSIS:  CAD  PROCEDURE:  Procedure(s):  CORONARY ARTERY BYPASS GRAFTING x 3 -LIMA to LAD -SVG to DIAGONAL -SVG to OM 1  ENDOSCOPIC HARVEST GREATER SAPHENOUS VEIN -Right Leg  INTRAOPERATIVE TRANSESOPHAGEAL ECHOCARDIOGRAM (N/A)  SURGEON:  Surgeon(s) and Role:    Ivin Poot, MD - Primary  PHYSICIAN ASSISTANT: Ellwood Handler PA-C  ANESTHESIA:   general  EBL:  Total I/O In: 23 [I.V.:1100] Out: 0223 [Urine:1425]  BLOOD ADMINISTERED: CELLSAVER  DRAINS: Left Pleural Chest Tubes, Mediastinal Chest Drains   LOCAL MEDICATIONS USED:  NONE  SPECIMEN:  No Specimen  DISPOSITION OF SPECIMEN:  N/A  COUNTS:  YES  TOURNIQUET:  * No tourniquets in log *    DICTATION: .Dragon Dictation  PLAN OF CARE: Admit to inpatient   PATIENT DISPOSITION:  ICU - intubated and hemodynamically stable.   Delay start of Pharmacological VTE agent (>24hrs) due to surgical blood loss or risk of bleeding: yes

## 2017-05-09 NOTE — Progress Notes (Signed)
On-call cardiology fellow notified of CBG.

## 2017-05-09 NOTE — Progress Notes (Signed)
  Echocardiogram Echocardiogram Transesophageal has been performed.  Darlina Sicilian M 05/09/2017, 8:49 AM

## 2017-05-09 NOTE — Procedures (Signed)
Extubation Procedure Note  Patient Details:   Name: Carrie Kane DOB: 1941-06-11 MRN: [id number]   Airway Documentation:     Evaluation  O2 sats: stable throughout Complications: No apparent complications Patient did tolerate procedure well. Bilateral Breath Sounds: Clear   Yes, Placed on 4L [place]  SPO2 100%  Gonzella Lex 05/09/2017, 6:32 PM

## 2017-05-09 NOTE — Progress Notes (Signed)
The patient was examined and preop studies reviewed. There has been no change from the prior exam and the patient is ready for surgery.  Plan CABG on B [place]

## 2017-05-09 NOTE — Progress Notes (Addendum)
Hypoglycemic Event  CBG: 44, 34  Treatment: 1 amp D50  Symptoms: Sweaty  Follow-up CBG: Time:0005 CBG Result:173 Possible Reasons for Event: Unknown      Carrie Kane N Ennis Heavner

## 2017-05-09 NOTE — Progress Notes (Signed)
CT surgery. Rounds  Patient extubated Neuro intact Atrially paced with stable hemodynamics Minimal chest tube output Blood pressure controlled with IV nitroglycerin Cardiac index 2.2

## 2017-05-09 NOTE — Transfer of Care (Signed)
Immediate Anesthesia Transfer of Care Note  Patient: Carrie Kane  Procedure(s) Performed: Procedure(s): CORONARY ARTERY BYPASS GRAFTING (CABG) x 3 using left internal mammary artery and right greater saphenous vein harvested endoscopically (N/A) INTRAOPERATIVE TRANSESOPHAGEAL ECHOCARDIOGRAM (N/A)  Patient Location: SICU  Anesthesia Type:General  Level of Consciousness: sedated and Patient remains intubated per anesthesia plan  Airway & Oxygen Therapy: Patient remains intubated per anesthesia plan and Patient placed on Ventilator (see vital sign flow sheet for setting)  Post-op Assessment: Report given to RN and Post -op Vital signs reviewed and stable  Post vital signs: Reviewed and stable  Last Vitals:  Vitals:   05/09/17 0422 05/09/17 0428  BP: (!) 174/80 (!) 149/70  Pulse: 67 67  Resp:    Temp:      Last Pain:  Vitals:   05/09/17 0432  TempSrc:   PainSc: 1       Patients Stated Pain Goal: 0 (27/07/86 7544)  Complications: No apparent anesthesia complications

## 2017-05-09 NOTE — Anesthesia Procedure Notes (Signed)
Central Venous Catheter Insertion Performed by: Duane Boston, anesthesiologist Start/End7/23/2018 6:44 AM, 05/09/2017 7:54 AM Patient location: Pre-op. Preanesthetic checklist: patient identified, IV checked, site marked, risks and benefits discussed, surgical consent, monitors and equipment checked, pre-op evaluation, timeout performed and anesthesia consent Position: Trendelenburg Lidocaine 1% used for infiltration and patient sedated Hand hygiene performed , maximum sterile barriers used  and Seldinger technique used Catheter size: 8.5 Fr Total catheter length 8. PA cath was placed.Sheath introducer Swan type:thermodilution PA Cath depth:50 Procedure performed using ultrasound guided technique. Ultrasound Notes:anatomy identified, needle tip was noted to be adjacent to the nerve/plexus identified, no ultrasound evidence of intravascular and/or intraneural injection and image(s) printed for medical record Attempts: 1 Following insertion, line sutured and dressing applied. Post procedure assessment: free fluid flow, blood return through all ports and no air  Patient tolerated the procedure well with no immediate complications.

## 2017-05-09 NOTE — Anesthesia Postprocedure Evaluation (Addendum)
Anesthesia Post Note  Patient: Carrie Kane  Procedure(s) Performed: Procedure(s) (LRB): CORONARY ARTERY BYPASS GRAFTING (CABG) x 3 using left internal mammary artery and right greater saphenous vein harvested endoscopically (N/A) INTRAOPERATIVE TRANSESOPHAGEAL ECHOCARDIOGRAM (N/A)     Patient location during evaluation: SICU Anesthesia Type: General Level of consciousness: sedated Pain management: pain level controlled Vital Signs Assessment: post-procedure vital signs reviewed and stable Respiratory status: patient remains intubated per anesthesia plan Cardiovascular status: stable Anesthetic complications: no    Last Vitals:  Vitals:   05/09/17 0422 05/09/17 0428  BP: (!) 174/80 (!) 149/70  Pulse: 67 67  Resp:    Temp:      Last Pain:  Vitals:   05/09/17 0432  TempSrc:   PainSc: 1                  Tahlia Deamer DANIEL

## 2017-05-09 NOTE — Plan of Care (Signed)
Problem: Cardiac: Goal: Hemodynamic stability will improve Outcome: Progressing Wean off drips for BP Goal: Ability to maintain an adequate cardiac output will improve Outcome: Progressing CI will improve Goal: Will show no signs and symptoms of excessive bleeding Outcome: Progressing Chest tube drainage will be minimal

## 2017-05-09 NOTE — Anesthesia Procedure Notes (Signed)
Arterial Line Insertion Start/End7/23/2018 7:05 AM, 05/09/2017 7:10 AM Performed by: CRNA  Patient location: Pre-op. Preanesthetic checklist: patient identified, IV checked, site marked, risks and benefits discussed, surgical consent, monitors and equipment checked, pre-op evaluation, timeout performed and anesthesia consent Lidocaine 1% used for infiltration Left, radial was placed Catheter size: 20 G Hand hygiene performed , maximum sterile barriers used  and Seldinger technique used Allen's test indicative of satisfactory collateral circulation Attempts: 1 Procedure performed without using ultrasound guided technique. Following insertion, Biopatch and dressing applied. Post procedure assessment: normal  Patient tolerated the procedure well with no immediate complications.

## 2017-05-10 ENCOUNTER — Inpatient Hospital Stay (HOSPITAL_COMMUNITY): Payer: Medicare Other

## 2017-05-10 ENCOUNTER — Encounter (HOSPITAL_COMMUNITY): Payer: Self-pay | Admitting: Cardiothoracic Surgery

## 2017-05-10 LAB — CBC
HCT: 28 % — ABNORMAL LOW (ref 36.0–46.0)
HEMATOCRIT: 29.4 % — AB (ref 36.0–46.0)
HEMOGLOBIN: 9.4 g/dL — AB (ref 12.0–15.0)
HEMOGLOBIN: 9.5 g/dL — AB (ref 12.0–15.0)
MCH: 28.7 pg (ref 26.0–34.0)
MCH: 27.7 pg (ref 26.0–34.0)
MCHC: 33.6 g/dL (ref 30.0–36.0)
MCHC: 32.3 g/dL (ref 30.0–36.0)
MCV: 85.4 fL (ref 78.0–100.0)
MCV: 85.7 fL (ref 78.0–100.0)
PLATELETS: 142 10*3/uL — AB (ref 150–400)
Platelets: 156 10*3/uL (ref 150–400)
RBC: 3.28 MIL/uL — AB (ref 3.87–5.11)
RBC: 3.43 MIL/uL — AB (ref 3.87–5.11)
RDW: 15.4 % (ref 11.5–15.5)
RDW: 15.6 % — ABNORMAL HIGH (ref 11.5–15.5)
WBC: 10.9 10*3/uL — ABNORMAL HIGH (ref 4.0–10.5)
WBC: 13.5 10*3/uL — ABNORMAL HIGH (ref 4.0–10.5)

## 2017-05-10 LAB — POCT I-STAT 3, ART BLOOD GAS (G3+)
ACID-BASE EXCESS: 2 mmol/L (ref 0.0–2.0)
ACID-BASE EXCESS: 3 mmol/L — AB (ref 0.0–2.0)
ACID-BASE EXCESS: 3 mmol/L — AB (ref 0.0–2.0)
Acid-Base Excess: 2 mmol/L (ref 0.0–2.0)
Acid-Base Excess: 2 mmol/L (ref 0.0–2.0)
BICARBONATE: 27 mmol/L (ref 20.0–28.0)
BICARBONATE: 26.7 mmol/L (ref 20.0–28.0)
Bicarbonate: 26.9 mmol/L (ref 20.0–28.0)
Bicarbonate: 26.8 mmol/L (ref 20.0–28.0)
Bicarbonate: 27.8 mmol/L (ref 20.0–28.0)
O2 SAT: 100 %
O2 SAT: 100 %
O2 Saturation: 100 %
O2 Saturation: 100 %
O2 Saturation: 100 %
PCO2 ART: 43.2 mmHg (ref 32.0–48.0)
PCO2 ART: 40.6 mmHg (ref 32.0–48.0)
PCO2 ART: 43.4 mmHg (ref 32.0–48.0)
PCO2 ART: 43.1 mmHg (ref 32.0–48.0)
PO2 ART: 258 mmHg — AB (ref 83.0–108.0)
PO2 ART: 274 mmHg — AB (ref 83.0–108.0)
PO2 ART: 258 mmHg — AB (ref 83.0–108.0)
TCO2: 28 mmol/L (ref 0–100)
TCO2: 28 mmol/L (ref 0–100)
TCO2: 28 mmol/L (ref 0–100)
TCO2: 28 mmol/L (ref 0–100)
TCO2: 29 mmol/L (ref 0–100)
pCO2 arterial: 37.3 mmHg (ref 32.0–48.0)
pH, Arterial: 7.403 (ref 7.350–7.450)
pH, Arterial: 7.431 (ref 7.350–7.450)
pH, Arterial: 7.397 (ref 7.350–7.450)
pH, Arterial: 7.417 (ref 7.350–7.450)
pH, Arterial: 7.464 — ABNORMAL HIGH (ref 7.350–7.450)
pO2, Arterial: 290 mmHg — ABNORMAL HIGH (ref 83.0–108.0)
pO2, Arterial: 325 mmHg — ABNORMAL HIGH (ref 83.0–108.0)

## 2017-05-10 LAB — POCT I-STAT, CHEM 8
BUN: 16 mg/dL (ref 6–20)
BUN: 11 mg/dL (ref 6–20)
BUN: 16 mg/dL (ref 6–20)
BUN: 12 mg/dL (ref 6–20)
BUN: 12 mg/dL (ref 6–20)
BUN: 12 mg/dL (ref 6–20)
BUN: 13 mg/dL (ref 6–20)
CALCIUM ION: 1.05 mmol/L — AB (ref 1.15–1.40)
CALCIUM ION: 1.04 mmol/L — AB (ref 1.15–1.40)
CALCIUM ION: 1.05 mmol/L — AB (ref 1.15–1.40)
CHLORIDE: 105 mmol/L (ref 101–111)
CHLORIDE: 95 mmol/L — AB (ref 101–111)
CHLORIDE: 102 mmol/L (ref 101–111)
CHLORIDE: 102 mmol/L (ref 101–111)
CREATININE: 0.4 mg/dL — AB (ref 0.44–1.00)
CREATININE: 0.5 mg/dL (ref 0.44–1.00)
CREATININE: 0.6 mg/dL (ref 0.44–1.00)
CREATININE: 0.5 mg/dL (ref 0.44–1.00)
Calcium, Ion: 1.23 mmol/L (ref 1.15–1.40)
Calcium, Ion: 0.98 mmol/L — ABNORMAL LOW (ref 1.15–1.40)
Calcium, Ion: 1.22 mmol/L (ref 1.15–1.40)
Calcium, Ion: 1.17 mmol/L (ref 1.15–1.40)
Chloride: 106 mmol/L (ref 101–111)
Chloride: 102 mmol/L (ref 101–111)
Chloride: 100 mmol/L — ABNORMAL LOW (ref 101–111)
Creatinine, Ser: 0.7 mg/dL (ref 0.44–1.00)
Creatinine, Ser: 0.5 mg/dL (ref 0.44–1.00)
Creatinine, Ser: 0.9 mg/dL (ref 0.44–1.00)
GLUCOSE: 108 mg/dL — AB (ref 65–99)
GLUCOSE: 102 mg/dL — AB (ref 65–99)
GLUCOSE: 290 mg/dL — AB (ref 65–99)
Glucose, Bld: 175 mg/dL — ABNORMAL HIGH (ref 65–99)
Glucose, Bld: 114 mg/dL — ABNORMAL HIGH (ref 65–99)
Glucose, Bld: 150 mg/dL — ABNORMAL HIGH (ref 65–99)
Glucose, Bld: 125 mg/dL — ABNORMAL HIGH (ref 65–99)
HCT: 23 % — ABNORMAL LOW (ref 36.0–46.0)
HCT: 24 % — ABNORMAL LOW (ref 36.0–46.0)
HCT: 28 % — ABNORMAL LOW (ref 36.0–46.0)
HEMATOCRIT: 36 % (ref 36.0–46.0)
HEMATOCRIT: 23 % — AB (ref 36.0–46.0)
HEMATOCRIT: 29 % — AB (ref 36.0–46.0)
HEMATOCRIT: 28 % — AB (ref 36.0–46.0)
HEMOGLOBIN: 9.9 g/dL — AB (ref 12.0–15.0)
HEMOGLOBIN: 8.2 g/dL — AB (ref 12.0–15.0)
HEMOGLOBIN: 9.5 g/dL — AB (ref 12.0–15.0)
Hemoglobin: 12.2 g/dL (ref 12.0–15.0)
Hemoglobin: 7.8 g/dL — ABNORMAL LOW (ref 12.0–15.0)
Hemoglobin: 7.8 g/dL — ABNORMAL LOW (ref 12.0–15.0)
Hemoglobin: 9.5 g/dL — ABNORMAL LOW (ref 12.0–15.0)
POTASSIUM: 4.3 mmol/L (ref 3.5–5.1)
POTASSIUM: 4.4 mmol/L (ref 3.5–5.1)
Potassium: 4.4 mmol/L (ref 3.5–5.1)
Potassium: 4.2 mmol/L (ref 3.5–5.1)
Potassium: 5.4 mmol/L — ABNORMAL HIGH (ref 3.5–5.1)
Potassium: 5 mmol/L (ref 3.5–5.1)
Potassium: 5.6 mmol/L — ABNORMAL HIGH (ref 3.5–5.1)
SODIUM: 142 mmol/L (ref 135–145)
SODIUM: 136 mmol/L (ref 135–145)
SODIUM: 141 mmol/L (ref 135–145)
SODIUM: 140 mmol/L (ref 135–145)
SODIUM: 137 mmol/L (ref 135–145)
Sodium: 137 mmol/L (ref 135–145)
Sodium: 139 mmol/L (ref 135–145)
TCO2: 25 mmol/L (ref 0–100)
TCO2: 26 mmol/L (ref 0–100)
TCO2: 29 mmol/L (ref 0–100)
TCO2: 26 mmol/L (ref 0–100)
TCO2: 26 mmol/L (ref 0–100)
TCO2: 28 mmol/L (ref 0–100)
TCO2: 23 mmol/L (ref 0–100)

## 2017-05-10 LAB — CREATININE, SERUM
CREATININE: 1.09 mg/dL — AB (ref 0.44–1.00)
GFR calc Af Amer: 57 mL/min — ABNORMAL LOW (ref >60)
GFR, EST NON AFRICAN AMERICAN: 49 mL/min — AB (ref >60)

## 2017-05-10 LAB — GLUCOSE, CAPILLARY
GLUCOSE-CAPILLARY: 139 mg/dL — AB (ref 65–99)
GLUCOSE-CAPILLARY: 124 mg/dL — AB (ref 65–99)
GLUCOSE-CAPILLARY: 133 mg/dL — AB (ref 65–99)
GLUCOSE-CAPILLARY: 209 mg/dL — AB (ref 65–99)
GLUCOSE-CAPILLARY: 191 mg/dL — AB (ref 65–99)
GLUCOSE-CAPILLARY: 220 mg/dL — AB (ref 65–99)
GLUCOSE-CAPILLARY: 211 mg/dL — AB (ref 65–99)
Glucose-Capillary: 114 mg/dL — ABNORMAL HIGH (ref 65–99)
Glucose-Capillary: 121 mg/dL — ABNORMAL HIGH (ref 65–99)
Glucose-Capillary: 123 mg/dL — ABNORMAL HIGH (ref 65–99)
Glucose-Capillary: 126 mg/dL — ABNORMAL HIGH (ref 65–99)
Glucose-Capillary: 127 mg/dL — ABNORMAL HIGH (ref 65–99)
Glucose-Capillary: 129 mg/dL — ABNORMAL HIGH (ref 65–99)
Glucose-Capillary: 147 mg/dL — ABNORMAL HIGH (ref 65–99)
Glucose-Capillary: 154 mg/dL — ABNORMAL HIGH (ref 65–99)
Glucose-Capillary: 167 mg/dL — ABNORMAL HIGH (ref 65–99)

## 2017-05-10 LAB — BASIC METABOLIC PANEL
Anion gap: 7 (ref 5–15)
BUN: 9 mg/dL (ref 6–20)
CHLORIDE: 109 mmol/L (ref 101–111)
CO2: 23 mmol/L (ref 22–32)
CREATININE: 0.83 mg/dL (ref 0.44–1.00)
Calcium: 8.2 mg/dL — ABNORMAL LOW (ref 8.9–10.3)
Glucose, Bld: 114 mg/dL — ABNORMAL HIGH (ref 65–99)
POTASSIUM: 3.7 mmol/L (ref 3.5–5.1)
SODIUM: 139 mmol/L (ref 135–145)

## 2017-05-10 LAB — MAGNESIUM
MAGNESIUM: 2.5 mg/dL — AB (ref 1.7–2.4)
Magnesium: 2.3 mg/dL (ref 1.7–2.4)

## 2017-05-10 IMAGING — DX DG CHEST 1V PORT
1 series · 1 of 1 positions shown · non-contrast
Comparison: [DATE] .  [DATE] .

CLINICAL DATA: CABG.  Chest tube .

EXAM:
PORTABLE CHEST 1 VIEW

[chest ap]
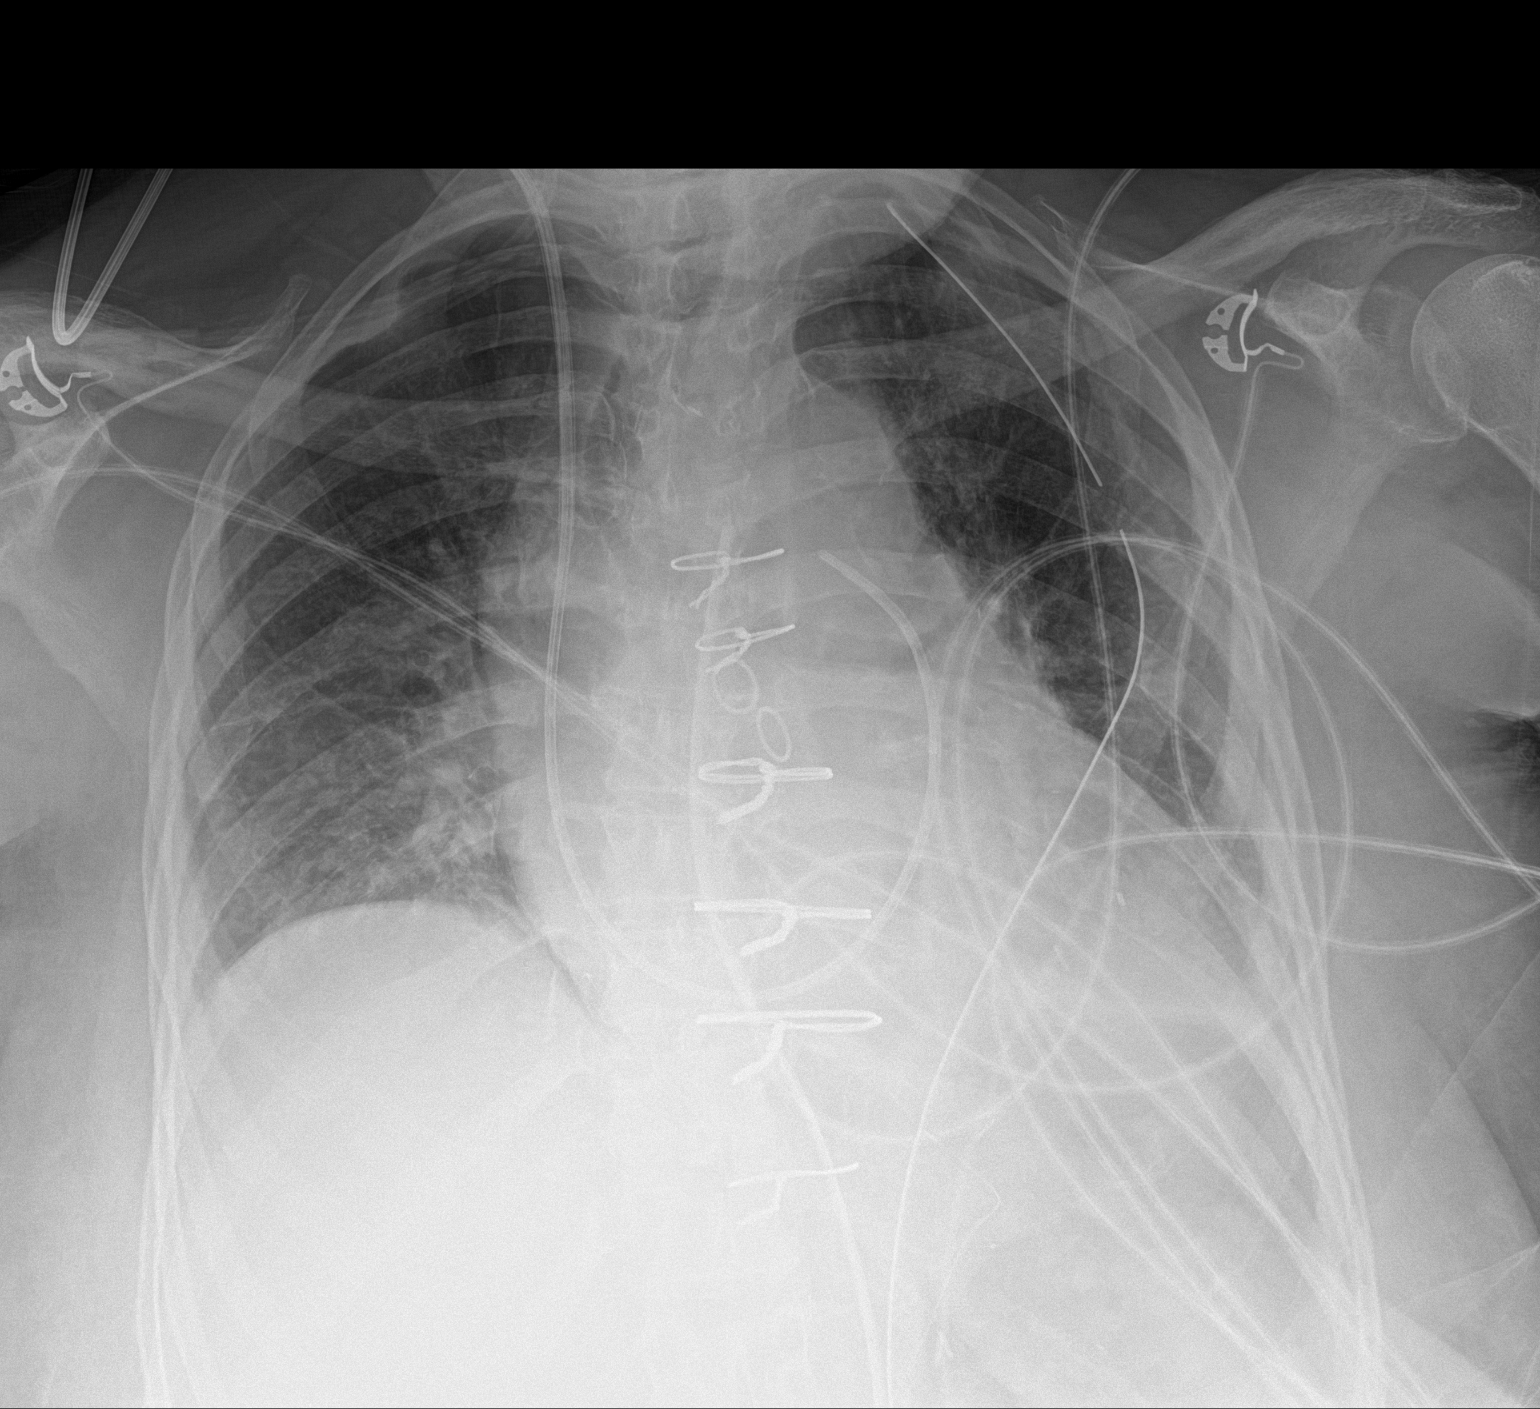

[1 of 1 positions shown; findings below may reference images not displayed]

FINDINGS: Interim extubation and removal of NG tube. Swan-Ganz catheter left
chest tube in stable position. No pneumothorax. Prior CABG.
Cardiomegaly. Mild bilateral interstitial prominence. Mild CHF
cannot be excluded.
IMPRESSION: 1. Interim extubation and removal of NG tube. Remaining lines and
tubes including left chest tube in stable position. No pneumothorax.

2. Prior CABG. Cardiomegaly with mild bilateral interstitial
prominence consistent with mild CHF.

## 2017-05-10 MED ORDER — INSULIN DETEMIR 100 UNIT/ML ~~LOC~~ SOLN: 8.0000 [IU] | Freq: Two times a day (BID) | SUBCUTANEOUS | Status: DC

## 2017-05-10 MED ORDER — FUROSEMIDE 10 MG/ML IJ SOLN
40.0000 mg | Freq: Two times a day (BID) | INTRAMUSCULAR | Status: DC
  Administered 2017-05-10: 40 mg via INTRAVENOUS
  Filled 2017-05-10: qty 4

## 2017-05-10 MED ORDER — POTASSIUM CHLORIDE 10 MEQ/50ML IV SOLN: 10.0000 meq | INTRAVENOUS | Status: DC | PRN

## 2017-05-10 MED ORDER — VANCOMYCIN HCL IN DEXTROSE 1-5 GM/200ML-% IV SOLN
1000.0000 mg | Freq: Once | INTRAVENOUS | Status: AC
  Administered 2017-05-10: 1000 mg via INTRAVENOUS
  Filled 2017-05-10: qty 200

## 2017-05-10 MED ORDER — FUROSEMIDE 10 MG/ML IJ SOLN
20.0000 mg | Freq: Two times a day (BID) | INTRAMUSCULAR | Status: DC
  Administered 2017-05-10 (×2): 20 mg via INTRAVENOUS
  Filled 2017-05-10 (×2): qty 2

## 2017-05-10 MED ORDER — POTASSIUM CHLORIDE 10 MEQ/50ML IV SOLN
10.0000 meq | INTRAVENOUS | Status: AC
  Administered 2017-05-10 (×3): 10 meq via INTRAVENOUS

## 2017-05-10 MED ORDER — CLONIDINE HCL 0.3 MG/24HR TD PTWK
0.3000 mg | MEDICATED_PATCH | TRANSDERMAL | Status: DC
  Administered 2017-05-10: 0.3 mg via TRANSDERMAL
  Filled 2017-05-10: qty 1

## 2017-05-10 MED ORDER — BENAZEPRIL HCL 5 MG PO TABS
10.0000 mg | ORAL_TABLET | Freq: Two times a day (BID) | ORAL | Status: DC
  Administered 2017-05-10 – 2017-05-14 (×10): 10 mg via ORAL
  Filled 2017-05-10: qty 2
  Filled 2017-05-10: qty 1
  Filled 2017-05-10 (×2): qty 2
  Filled 2017-05-10 (×3): qty 1
  Filled 2017-05-10 (×2): qty 2
  Filled 2017-05-10: qty 1

## 2017-05-10 MED ORDER — INSULIN DETEMIR 100 UNIT/ML ~~LOC~~ SOLN
28.0000 [IU] | Freq: Two times a day (BID) | SUBCUTANEOUS | Status: DC
  Administered 2017-05-10 – 2017-05-15 (×8): 28 [IU] via SUBCUTANEOUS
  Filled 2017-05-10 (×11): qty 0.28

## 2017-05-10 MED ORDER — HYDRALAZINE HCL 20 MG/ML IJ SOLN
10.0000 mg | INTRAMUSCULAR | Status: DC | PRN
  Administered 2017-05-11 – 2017-05-12 (×2): 10 mg via INTRAVENOUS
  Filled 2017-05-10 (×2): qty 1

## 2017-05-10 MED ORDER — INSULIN ASPART 100 UNIT/ML ~~LOC~~ SOLN
0.0000 [IU] | SUBCUTANEOUS | Status: DC
Start: 2017-05-10 — End: 2017-05-11
  Administered 2017-05-10: 12 [IU] via SUBCUTANEOUS
  Administered 2017-05-10 (×3): 8 [IU] via SUBCUTANEOUS
  Administered 2017-05-11: 2 [IU] via SUBCUTANEOUS

## 2017-05-10 MED ORDER — INSULIN ASPART 100 UNIT/ML ~~LOC~~ SOLN
6.0000 [IU] | Freq: Three times a day (TID) | SUBCUTANEOUS | Status: DC
  Administered 2017-05-11 – 2017-05-12 (×4): 6 [IU] via SUBCUTANEOUS

## 2017-05-10 MED ORDER — INSULIN DETEMIR 100 UNIT/ML ~~LOC~~ SOLN
18.0000 [IU] | Freq: Two times a day (BID) | SUBCUTANEOUS | Status: DC
  Administered 2017-05-10: 18 [IU] via SUBCUTANEOUS
  Filled 2017-05-10 (×2): qty 0.18

## 2017-05-10 MED ORDER — INSULIN ASPART 100 UNIT/ML ~~LOC~~ SOLN
4.0000 [IU] | Freq: Three times a day (TID) | SUBCUTANEOUS | Status: DC
  Administered 2017-05-10 (×2): 4 [IU] via SUBCUTANEOUS

## 2017-05-10 MED FILL — Potassium Chloride Inj 2 mEq/ML: INTRAVENOUS | Qty: 40 | Status: AC

## 2017-05-10 MED FILL — Sodium Bicarbonate IV Soln 8.4%: INTRAVENOUS | Qty: 50 | Status: AC

## 2017-05-10 MED FILL — Heparin Sodium (Porcine) Inj 1000 Unit/ML: INTRAMUSCULAR | Qty: 30 | Status: AC

## 2017-05-10 MED FILL — Electrolyte-R (PH 7.4) Solution: INTRAVENOUS | Qty: 6000 | Status: AC

## 2017-05-10 MED FILL — Sodium Chloride IV Soln 0.9%: INTRAVENOUS | Qty: 4000 | Status: AC

## 2017-05-10 MED FILL — Lidocaine HCl IV Inj 20 MG/ML: INTRAVENOUS | Qty: 5 | Status: AC

## 2017-05-10 MED FILL — Mannitol IV Soln 20%: INTRAVENOUS | Qty: 500 | Status: AC

## 2017-05-10 MED FILL — Magnesium Sulfate Inj 50%: INTRAMUSCULAR | Qty: 10 | Status: AC

## 2017-05-10 MED FILL — Heparin Sodium (Porcine) Inj 1000 Unit/ML: INTRAMUSCULAR | Qty: 10 | Status: AC

## 2017-05-10 NOTE — Care Management Note (Signed)
Case Management Note  Patient Details  Name: Carrie Kane MRN: [id number] Date of Birth: [birth year]  Subjective/Objective:  Per previous NCM note, Pt presented for Unstable Angina. [place] 05/02/17 at [facility] showed severe multivessel coronary artery disease . CVTS consulted and plan for CABG on Monday 05-09-17. Pt is from home with husband and PTA- pt was independent.   7/24 Tomi Bamberger RN, BSN- POD 1 CABG, dc chest tubes, diuresis.                    Action/Plan: NCM will follow for dc needs.   Expected Discharge Date:                  Expected Discharge Plan:  [place]  In-House Referral:  NA  Discharge planning Services  CM Consult  Post Acute Care Choice:    Choice offered to:     DME Arranged:    DME Agency:     HH Arranged:    HH Agency:     Status of Service:  In process, will continue to follow  If discussed at Long Length of Stay Meetings, dates discussed:    Additional Comments:  Zenon Mayo, RN 05/10/2017, 5:36 PM

## 2017-05-10 NOTE — Progress Notes (Signed)
1 Day Post-Op Procedure(s) (LRB): CORONARY ARTERY BYPASS GRAFTING (CABG) x 3 using left internal mammary artery and right greater saphenous vein harvested endoscopically (N/A) INTRAOPERATIVE TRANSESOPHAGEAL ECHOCARDIOGRAM (N/A) Subjective: Stable after CABG Now in low junctional rhythm needing A-V pacing BP high  Objective: Vital signs in last 24 hours: Temp:  [96.6 F (35.9 C)-98.8 F (37.1 C)] 98.8 F (37.1 C) (07/24 0800) Pulse Rate:  [72-90] 89 (07/24 0800) Cardiac Rhythm: Ventricular paced (07/24 0400) Resp:  [0-26] 19 (07/24 0800) BP: (88-145)/(54-76) 130/55 (07/24 0800) SpO2:  [94 %-100 %] 95 % (07/24 0800) Arterial Line BP: (118-211)/(47-65) 172/49 (07/24 0800) FiO2 (%):  [40 %-50 %] 40 % (07/23 1749) Weight:  [189 lb 2.5 oz (85.8 kg)-203 lb 4.2 oz (92.2 kg)] 203 lb 4.2 oz (92.2 kg) (07/24 0500)  Hemodynamic parameters for last 24 hours: PAP: (18-34)/(5-18) 28/11 CO:  [2.5 L/min-5.7 L/min] 5.4 L/min CI:  [1.3 L/min/m2-2.9 L/min/m2] 2.7 L/min/m2  Intake/Output from previous day: 07/23 0701 - 07/24 0700 In: 5777.3 [P.O.:240; I.V.:3072.3; Blood:665; IV Piggyback:1800] Out: 0932 [Urine:3140; Blood:660; Chest Tube:432] Intake/Output this shift: Total I/O In: -  Out: 35 [Urine:15; Chest Tube:20]       Exam    General- alert and comfortable   Lungs- clear without rales, wheezes   Cor- regular rate and rhythm, no murmur , gallop   Abdomen- soft, non-tender   Extremities - warm, non-tender, minimal edema   Neuro- oriented, appropriate, no focal weakness   Lab Results:  Recent Labs  05/09/17 1927 05/09/17 1947 05/10/17 0312  WBC 10.2  --  10.9*  HGB 9.2* 11.6* 9.4*  HCT 27.4* 34.0* 28.0*  PLT 138*  --  142*   BMET:  Recent Labs  05/09/17 0448  05/09/17 1947 05/10/17 0312  NA 139  < > 141 139  K 4.3  < > 4.0 3.7  CL 107  --  108 109  CO2 25  --   --  23  GLUCOSE 173*  < > 118* 114*  BUN 14  --  11 9  CREATININE 0.93  < > 0.60 0.83  CALCIUM 9.3  --    --  8.2*  < > = values in this interval not displayed.  PT/INR:  Recent Labs  05/09/17 1320  LABPROT 17.2*  INR 1.40   ABG    Component Value Date/Time   PHART 7.381 05/09/2017 1943   HCO3 24.6 05/09/2017 1943   TCO2 24 05/09/2017 1947   ACIDBASEDEF 1.0 05/09/2017 1943   O2SAT 97.0 05/09/2017 1943   CBG (last 3)   Recent Labs  05/10/17 0631 05/10/17 0735 05/10/17 0820  GLUCAP 133* 127* 147*    Assessment/Plan: S/P Procedure(s) (LRB): CORONARY ARTERY BYPASS GRAFTING (CABG) x 3 using left internal mammary artery and right greater saphenous vein harvested endoscopically (N/A) INTRAOPERATIVE TRANSESOPHAGEAL ECHOCARDIOGRAM (N/A) Mobilize Diuresis Diabetes control d/c tubes/lines See progression orders   LOS: 8 days    Tharon Aquas Trigt III 05/10/2017

## 2017-05-10 NOTE — Progress Notes (Signed)
Patient ID: Carrie Kane, female   DOB: 10-07-1941, 76 y.o.   MRN: [id number] SICU Evening Rounds:  Hemodynamically stable in sinus rhythm on milrinone 0.125  Urine output good  BMET    Component Value Date/Time   NA 137 05/10/2017 1736   K 4.4 05/10/2017 1736   CL 100 (L) 05/10/2017 1736   CO2 23 05/10/2017 0312   GLUCOSE 290 (H) 05/10/2017 1736   BUN 13 05/10/2017 1736   CREATININE 0.90 05/10/2017 1736   CALCIUM 8.2 (L) 05/10/2017 0312   GFRNONAA 49 (L) 05/10/2017 1724   GFRAA 57 (L) 05/10/2017 1724   CBC    Component Value Date/Time   WBC 13.5 (H) 05/10/2017 1724   RBC 3.43 (L) 05/10/2017 1724   HGB 9.5 (L) 05/10/2017 1736   HCT 28.0 (L) 05/10/2017 1736   PLT 156 05/10/2017 1724   MCV 85.7 05/10/2017 1724   MCH 27.7 05/10/2017 1724   MCHC 32.3 05/10/2017 1724   RDW 15.6 (H) 05/10/2017 1724   CBG 290 earlier this evening and now 220. On levemir bid and SSI. Continue to follow for now.

## 2017-05-11 ENCOUNTER — Inpatient Hospital Stay (HOSPITAL_COMMUNITY): Payer: Medicare Other

## 2017-05-11 LAB — BASIC METABOLIC PANEL
Anion gap: 7 (ref 5–15)
BUN: 13 mg/dL (ref 6–20)
CO2: 25 mmol/L (ref 22–32)
Calcium: 8.6 mg/dL — ABNORMAL LOW (ref 8.9–10.3)
Chloride: 104 mmol/L (ref 101–111)
Creatinine, Ser: 0.99 mg/dL (ref 0.44–1.00)
GFR calc Af Amer: >60 mL/min (ref >60)
GFR calc non Af Amer: 55 mL/min — ABNORMAL LOW (ref >60)
Glucose, Bld: 147 mg/dL — ABNORMAL HIGH (ref 65–99)
Potassium: 3.9 mmol/L (ref 3.5–5.1)
Sodium: 136 mmol/L (ref 135–145)

## 2017-05-11 LAB — GLUCOSE, CAPILLARY
GLUCOSE-CAPILLARY: 291 mg/dL — AB (ref 65–99)
GLUCOSE-CAPILLARY: 123 mg/dL — AB (ref 65–99)
GLUCOSE-CAPILLARY: 154 mg/dL — AB (ref 65–99)
GLUCOSE-CAPILLARY: 99 mg/dL (ref 65–99)
Glucose-Capillary: 130 mg/dL — ABNORMAL HIGH (ref 65–99)
Glucose-Capillary: 154 mg/dL — ABNORMAL HIGH (ref 65–99)
Glucose-Capillary: 193 mg/dL — ABNORMAL HIGH (ref 65–99)
Glucose-Capillary: 91 mg/dL (ref 65–99)

## 2017-05-11 LAB — TYPE AND SCREEN
ABO/RH(D): A POS
Antibody Screen: NEGATIVE
Unit division: 0
Unit division: 0
Unit division: 0
Unit division: 0

## 2017-05-11 LAB — BPAM RBC
Blood Product Expiration Date: 201808042359
Blood Product Expiration Date: 201808052359
Blood Product Expiration Date: 201808072359
Blood Product Expiration Date: 201808072359
ISSUE DATE / TIME: 201807231137
ISSUE DATE / TIME: 201807231137
ISSUE DATE / TIME: 201807231137
ISSUE DATE / TIME: 201807231137
Unit Type and Rh: 6200
Unit Type and Rh: 6200
Unit Type and Rh: 6200
Unit Type and Rh: 6200

## 2017-05-11 LAB — CBC
HCT: 29.7 % — ABNORMAL LOW (ref 36.0–46.0)
Hemoglobin: 9.8 g/dL — ABNORMAL LOW (ref 12.0–15.0)
MCH: 28.2 pg (ref 26.0–34.0)
MCHC: 33 g/dL (ref 30.0–36.0)
MCV: 85.6 fL (ref 78.0–100.0)
Platelets: 154 10*3/uL (ref 150–400)
RBC: 3.47 MIL/uL — ABNORMAL LOW (ref 3.87–5.11)
RDW: 15.6 % — ABNORMAL HIGH (ref 11.5–15.5)
WBC: 15.4 10*3/uL — ABNORMAL HIGH (ref 4.0–10.5)

## 2017-05-11 IMAGING — DX DG CHEST 1V PORT
1 series · 1 of 1 positions shown · non-contrast
Comparison: [DATE] .

CLINICAL DATA: Sore chest.  CABG.

EXAM:
PORTABLE CHEST 1 VIEW

[chest]
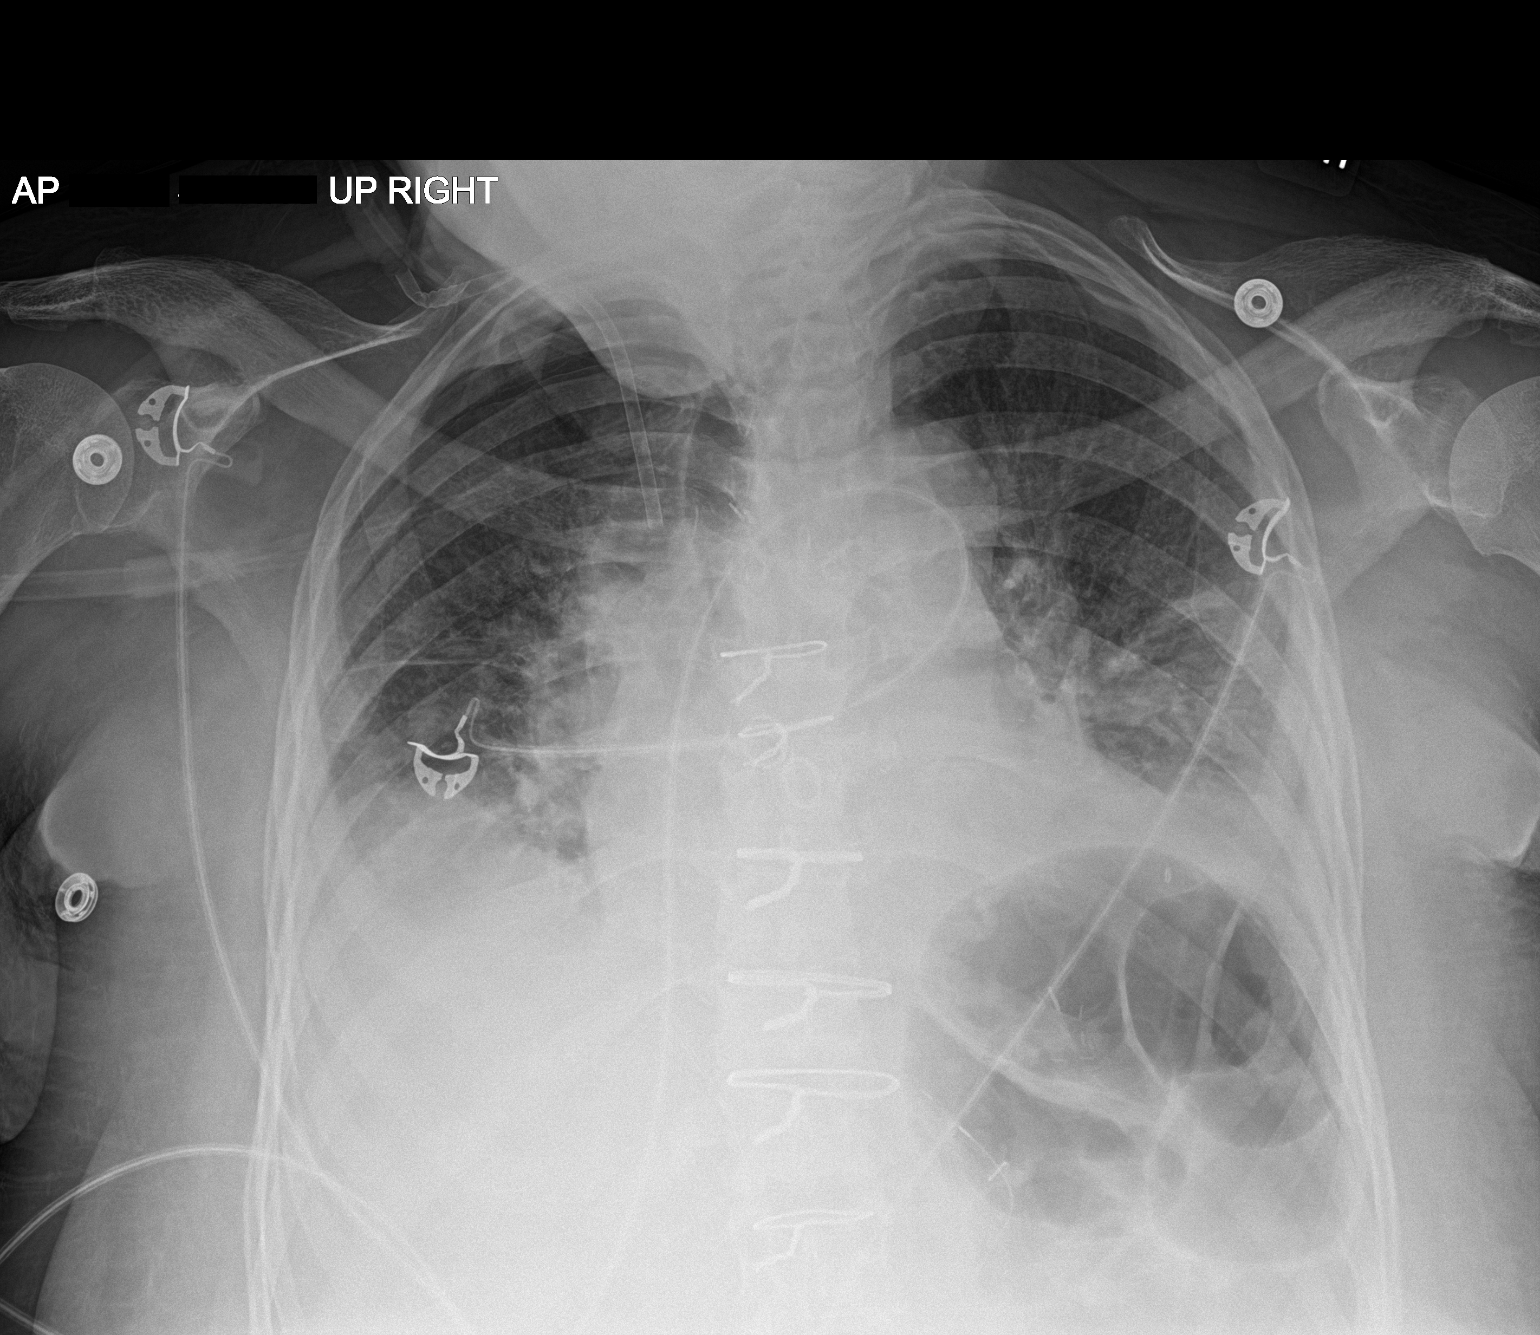

[1 of 1 positions shown; findings below may reference images not displayed]

FINDINGS: Interim removal of left chest tube. Interim removal of Swan-Ganz
catheter. Right IJ sheath in stable position. Prior CABG. Surgical
clips and wiring noted over the chest and upper abdomen .
Cardiomegaly with diffuse pulmonary venous congestion bilateral
interstitial prominence consistent with CHF. Small bilateral pleural
effusions cannot be excluded . Low lung volumes with basilar
atelectasis.
IMPRESSION: 1. Interim removal of left chest tube and Swan-Ganz catheter. Right
IJ sheath in stable position. No pneumothorax.

2. Prior CABG. Cardiomegaly with diffuse bilateral pulmonary
interstitial prominence again noted consistent with CHF. Small
bilateral pleural effusions .

## 2017-05-11 MED ORDER — METOLAZONE 2.5 MG PO TABS
5.0000 mg | ORAL_TABLET | Freq: Every day | ORAL | Status: AC
  Administered 2017-05-11 – 2017-05-13 (×3): 5 mg via ORAL
  Filled 2017-05-11 (×2): qty 1
  Filled 2017-05-11: qty 2

## 2017-05-11 MED ORDER — SODIUM CHLORIDE 0.9% FLUSH
3.0000 mL | Freq: Two times a day (BID) | INTRAVENOUS | Status: DC
  Administered 2017-05-11 – 2017-05-14 (×6): 3 mL via INTRAVENOUS

## 2017-05-11 MED ORDER — FUROSEMIDE 10 MG/ML IJ SOLN
40.0000 mg | Freq: Two times a day (BID) | INTRAMUSCULAR | Status: AC
  Administered 2017-05-11 (×2): 40 mg via INTRAVENOUS
  Filled 2017-05-11 (×2): qty 4

## 2017-05-11 MED ORDER — SIMETHICONE 80 MG PO CHEW
80.0000 mg | CHEWABLE_TABLET | Freq: Four times a day (QID) | ORAL | Status: DC | PRN
Start: 2017-05-11 — End: 2017-05-15

## 2017-05-11 MED ORDER — SODIUM CHLORIDE 0.9 % IV SOLN: 250.0000 mL | INTRAVENOUS | Status: DC | PRN

## 2017-05-11 MED ORDER — INSULIN ASPART 100 UNIT/ML ~~LOC~~ SOLN: 0.0000 [IU] | Freq: Three times a day (TID) | SUBCUTANEOUS | Status: DC

## 2017-05-11 MED ORDER — SODIUM CHLORIDE 0.9% FLUSH
3.0000 mL | INTRAVENOUS | Status: DC | PRN
  Administered 2017-05-11: 13:00:00 via INTRAVENOUS
  Filled 2017-05-11: qty 3

## 2017-05-11 MED ORDER — FUROSEMIDE 40 MG PO TABS
40.0000 mg | ORAL_TABLET | Freq: Every day | ORAL | Status: DC
  Administered 2017-05-12 – 2017-05-15 (×4): 40 mg via ORAL
  Filled 2017-05-11 (×4): qty 1

## 2017-05-11 MED ORDER — MOVING RIGHT ALONG BOOK
Freq: Once | Status: AC
  Administered 2017-05-11: 1
  Filled 2017-05-11: qty 1

## 2017-05-11 MED ORDER — MAGNESIUM HYDROXIDE 400 MG/5ML PO SUSP: 30.0000 mL | Freq: Every day | ORAL | Status: DC | PRN

## 2017-05-11 MED ORDER — INSULIN ASPART 100 UNIT/ML ~~LOC~~ SOLN
0.0000 [IU] | Freq: Three times a day (TID) | SUBCUTANEOUS | Status: DC
  Administered 2017-05-11 (×2): 2 [IU] via SUBCUTANEOUS
  Administered 2017-05-12: 8 [IU] via SUBCUTANEOUS
  Administered 2017-05-12: 2 [IU] via SUBCUTANEOUS
  Administered 2017-05-12: 8 [IU] via SUBCUTANEOUS
  Administered 2017-05-12: 2 [IU] via SUBCUTANEOUS
  Administered 2017-05-13: 4 [IU] via SUBCUTANEOUS
  Administered 2017-05-13: 12 [IU] via SUBCUTANEOUS
  Administered 2017-05-14: 4 [IU] via SUBCUTANEOUS
  Administered 2017-05-14: 16 [IU] via SUBCUTANEOUS
  Administered 2017-05-15: 4 [IU] via SUBCUTANEOUS
  Administered 2017-05-15: 8 [IU] via SUBCUTANEOUS

## 2017-05-11 NOTE — Progress Notes (Signed)
      [address]       [place] [address]             [phone]      POD # 2 CABG x 3  No complaints this evening  BP (!) 154/62   Pulse 78   Temp 98 F (36.7 C) (Oral)   Resp 16   Ht 5\' 8"  (1.727 m)   Wt 204 lb 12.9 oz (92.9 kg)   SpO2 99%   BMI 31.14 kg/m    Intake/Output Summary (Last 24 hours) at 05/11/17 1817 Last data filed at 05/11/17 1645  Gross per 24 hour  Intake           745.75 ml  Output             2885 ml  Net         -2139.25 ml   CBG 130-154  Doing well POD # 2  Remo Lipps C. Roxan Hockey, MD [facility] [phone]

## 2017-05-11 NOTE — Op Note (Signed)
NAME:  Carrie Kane, CATER NO.:  MEDICAL RECORD NO.:  [id number]  LOCATION:                                 FACILITY:  PHYSICIAN:  Ivin Poot, M.D.  DATE OF BIRTH:  1941/05/13  DATE OF PROCEDURE:  05/09/2017 DATE OF DISCHARGE:                              OPERATIVE REPORT   PREOPERATIVE DIAGNOSES:  Non-ST elevation myocardial infarction, diabetes, hypertension, severe multivessel coronary artery disease.  POSTOPERATIVE DIAGNOSES:  Non-ST elevation myocardial infarction, diabetes, hypertension, severe multivessel coronary artery disease.  OPERATION: 1. Coronary artery bypass grafting x3 (left internal mammary artery to     left anterior descending artery, saphenous vein graft to diagonal,     saphenous vein graft to the obtuse marginal). 2. Endoscopic harvest of right leg greater saphenous vein.  SURGEON:  Ivin Poot, M.D.  ASSISTANT:  Ellwood Handler, PA-C.  ANESTHESIA:  General by Dr. Tamela Gammon.  INDICATIONS:  The patient is a 76 year old diabetic hypertensive female who presented with chest pain and positive cardiac enzymes.  Cardiac catheterization by Dr. Fletcher Anon demonstrated severe left main and multivessel coronary artery disease.  She was placed on heparin, nitroglycerin and stabilized.  Her renal function remained stable.  Her blood sugars were high and were optimized with diabetic regimen.  Blood pressure was well controlled.  She was recommended for multivessel CABG. I saw the patient in consultation after reviewing her cardiac catheterization and echocardiogram and agreed with the recommendation for multivessel CABG for treatment of her CAD.  I discussed with the patient the benefits of CABG for treatment of her coronary disease and to prevent further MI.  She understood that the surgery would improve survival, improve her symptoms and preserve her left ventricular function.  I discussed the aspects of the operation with the  patient including the use of general anesthesia and cardiopulmonary bypass, the location of the surgical incisions, and the expected postoperative hospital recovery.  I discussed with the patient the risks to her of coronary bypass surgery including risk of stroke, MI, bleeding, blood transfusion requirement, postoperative pulmonary problems including pleural effusion, postoperative arrhythmias, postoperative infection, and death.  After reviewing these issues, she demonstrated her understanding and agreed to proceed with surgery under what I felt was an informed consent.  OPERATIVE FINDINGS: 1. Small but adequate conduit. 2. Small but adequate targets in the coronary vessels. 3. Preserved LV systolic function following separation from     cardiopulmonary bypass. 4. Heavily diseased vessels, not adequate for redo surgical     revascularization.  OPERATIVE PROCEDURE:  The patient was brought to the operating room, placed supine on the operating table.  General anesthesia was induced under invasive hemodynamic monitoring.  A transesophageal echo probe was placed by the anesthesia team.  The patient was prepped and draped as a sterile field.  A proper time-out was performed.  A sternal incision was made as the saphenous vein was harvested endoscopically from the right leg.  The left internal mammary artery was harvested as a pedicle graft from its origin at the subclavian vessels.  The sternal retractor was placed using the deep blades due to the patient's obese body habitus.  The pericardium was opened and suspended.  Pursestrings were placed in the ascending aorta and right atrium and after heparin had been administered and the ACT was documented as being therapeutic, the patient was placed on cardiopulmonary bypass.  The coronaries were identified for grafting, and the mammary artery and vein grafts were prepared for the distal anastomoses.  Cardioplegia cannulas were placed both  antegrade and retrograde cold blood cardioplegia, and the patient was cooled to 32 degrees.  The aortic crossclamp was applied and 1 liter of cold blood cardioplegia was delivered in split doses between the antegrade aortic and retrograde coronary sinus catheters.  There was good cardioplegic arrest and septal temperature dropped less than 14 degrees.  Cardioplegia was delivered every 20 minutes.  The distal coronary anastomoses were performed.  The first distal anastomosis was to the OM branch of the left coronary.  This had a proximal 95% stenosis.  A reverse saphenous vein was sewn end-to-side with running 7-0 Prolene with good flow through the graft.  Cardioplegia was redosed.  The second distal anastomosis was to the diagonal branch of the LAD. This had an ostial 80% stenosis.  A reverse saphenous vein was sewn end- to-side with running 7-0 Prolene with good flow through the graft. Cardioplegia was redosed.  The third distal anastomosis was the distal third of the LAD. Proximally, it was heavily diseased and calcified.  There was a proximal 95% stenosis.  The left IMA pedicle was brought through an opening and the left lateral pericardium was brought down onto the LAD and sewn end- to-side with running 8-0 Prolene.  There was good flow through the anastomosis after briefly releasing the bulldog clamp on the mammary pedicle.  The bulldog was reapplied and the pedicle was secured to epicardium.  Cardioplegia was redosed.  The crossclamp was still in place, 2 proximal vein anastomoses were performed on the ascending aorta using a 4.5-mm punch and running 6-0 Prolene.  Prior to tying the final proximal anastomosis, air was vented from the coronaries with a dose of retrograde warm blood cardioplegia. The crossclamp was removed.  The vein grafts were open and de-aired and each had good flow and hemostasis was documented at the proximal and distal sites.  The patient was rewarmed and  reperfused.  Temporary pacing wires were applied.  The lungs were expanded, ventilator was resumed.  The cardioplegia cannulas had been removed.  The patient was weaned from cardiopulmonary bypass without difficulty.  Low-dose Milrinone was used because of her history of hypertension and elevated pulmonary artery pressures.  The patient was stable off bypass.  Cardiac output was normal.  Protamine was administered to reverse heparin without adverse reactive reaction.  The cannulas were removed.  The mediastinum was irrigated.  The superior pericardial fat was closed over the aorta.  Anterior mediastinal and left pleural tubes were placed and brought out through separate incisions.  The sternum was closed with interrupted wire.  The pectoralis fascia was closed with a running #1 Vicryl.  The subcutaneous and skin layers were closed in running Vicryl and sterile dressings were applied.  Total cardiopulmonary bypass time was 118 minutes.     Ivin Poot, M.D.     PV/MEDQ  D:  05/10/2017  T:  05/10/2017  Job:  [id number]  cc:   Kathlyn Sacramento, MD

## 2017-05-11 NOTE — Progress Notes (Signed)
2 Days Post-Op Procedure(s) (LRB): CORONARY ARTERY BYPASS GRAFTING (CABG) x 3 using left internal mammary artery and right greater saphenous vein harvested endoscopically (N/A) INTRAOPERATIVE TRANSESOPHAGEAL ECHOCARDIOGRAM (N/A) Subjective: Walking Atelectasis on CXR Now NSR off pacer  Objective: Vital signs in last 24 hours: Temp:  [97.5 F (36.4 C)-98.6 F (37 C)] 98.6 F (37 C) (07/25 0752) Pulse Rate:  [72-94] 80 (07/25 0800) Cardiac Rhythm: Ventricular paced (07/25 0400) Resp:  [0-28] 15 (07/25 0800) BP: (119-173)/(49-101) 124/60 (07/25 0800) SpO2:  [94 %-100 %] 97 % (07/25 0800) Weight:  [204 lb 12.9 oz (92.9 kg)] 204 lb 12.9 oz (92.9 kg) (07/25 0500)  Hemodynamic parameters for last 24 hours: PAP: (31-38)/(10-17) 34/17 CO:  [4.3 L/min] 4.3 L/min CI:  [2.2 L/min/m2] 2.2 L/min/m2  Intake/Output from previous day: 07/24 0701 - 07/25 0700 In: 1608.5 [P.O.:240; I.V.:968.5; IV Piggyback:400] Out: 1760 [Urine:1740; Chest Tube:20] Intake/Output this shift: No intake/output data recorded.       Exam    General- alert and comfortable   Lungs- clear without rales, wheezes   Cor- regular rate and rhythm, no murmur , gallop   Abdomen- soft, non-tender   Extremities - warm, non-tender, minimal edema   Neuro- oriented, appropriate, no focal weakness   Lab Results:  Recent Labs  05/10/17 1724 05/10/17 1736 05/11/17 0529  WBC 13.5*  --  15.4*  HGB 9.5* 9.5* 9.8*  HCT 29.4* 28.0* 29.7*  PLT 156  --  154   BMET:  Recent Labs  05/10/17 0312  05/10/17 1736 05/11/17 0529  NA 139  --  137 136  K 3.7  --  4.4 3.9  CL 109  --  100* 104  CO2 23  --   --  25  GLUCOSE 114*  --  290* 147*  BUN 9  --  13 13  CREATININE 0.83  < > 0.90 0.99  CALCIUM 8.2*  --   --  8.6*  < > = values in this interval not displayed.  PT/INR:  Recent Labs  05/09/17 1320  LABPROT 17.2*  INR 1.40   ABG    Component Value Date/Time   PHART 7.381 05/09/2017 1943   HCO3 24.6  05/09/2017 1943   TCO2 23 05/10/2017 1736   ACIDBASEDEF 1.0 05/09/2017 1943   O2SAT 97.0 05/09/2017 1943   CBG (last 3)   Recent Labs  05/10/17 2324 05/11/17 0041 05/11/17 0352  GLUCAP 211* 193* 154*    Assessment/Plan: S/P Procedure(s) (LRB): CORONARY ARTERY BYPASS GRAFTING (CABG) x 3 using left internal mammary artery and right greater saphenous vein harvested endoscopically (N/A) INTRAOPERATIVE TRANSESOPHAGEAL ECHOCARDIOGRAM (N/A) Mobilize Diuresis Diabetes control d/c tubes/lines Plan for transfer to step-down: see transfer orders   LOS: 9 days    Tharon Aquas Trigt III 05/11/2017

## 2017-05-12 ENCOUNTER — Telehealth: Payer: Self-pay

## 2017-05-12 ENCOUNTER — Inpatient Hospital Stay (HOSPITAL_COMMUNITY): Payer: Medicare Other

## 2017-05-12 LAB — BASIC METABOLIC PANEL
Anion gap: 11 (ref 5–15)
BUN: 16 mg/dL (ref 6–20)
CO2: 25 mmol/L (ref 22–32)
Calcium: 8.7 mg/dL — ABNORMAL LOW (ref 8.9–10.3)
Chloride: 100 mmol/L — ABNORMAL LOW (ref 101–111)
Creatinine, Ser: 0.92 mg/dL (ref 0.44–1.00)
GFR calc Af Amer: >60 mL/min (ref >60)
GFR calc non Af Amer: 60 mL/min — ABNORMAL LOW (ref >60)
Glucose, Bld: 159 mg/dL — ABNORMAL HIGH (ref 65–99)
Potassium: 3.3 mmol/L — ABNORMAL LOW (ref 3.5–5.1)
Sodium: 136 mmol/L (ref 135–145)

## 2017-05-12 LAB — GLUCOSE, CAPILLARY
GLUCOSE-CAPILLARY: 203 mg/dL — AB (ref 65–99)
GLUCOSE-CAPILLARY: 135 mg/dL — AB (ref 65–99)
Glucose-Capillary: 160 mg/dL — ABNORMAL HIGH (ref 65–99)
Glucose-Capillary: 225 mg/dL — ABNORMAL HIGH (ref 65–99)

## 2017-05-12 LAB — CBC
HCT: 33.6 % — ABNORMAL LOW (ref 36.0–46.0)
Hemoglobin: 11.2 g/dL — ABNORMAL LOW (ref 12.0–15.0)
MCH: 28.4 pg (ref 26.0–34.0)
MCHC: 33.3 g/dL (ref 30.0–36.0)
MCV: 85.1 fL (ref 78.0–100.0)
Platelets: 181 10*3/uL (ref 150–400)
RBC: 3.95 MIL/uL (ref 3.87–5.11)
RDW: 15.1 % (ref 11.5–15.5)
WBC: 14.1 10*3/uL — ABNORMAL HIGH (ref 4.0–10.5)

## 2017-05-12 IMAGING — DX DG CHEST 1V PORT
1 series · 1 of 1 positions shown · non-contrast
Comparison: [DATE] .

CLINICAL DATA: Sore chest.  Follow-up CABG .

EXAM:
PORTABLE CHEST 1 VIEW

[chest]
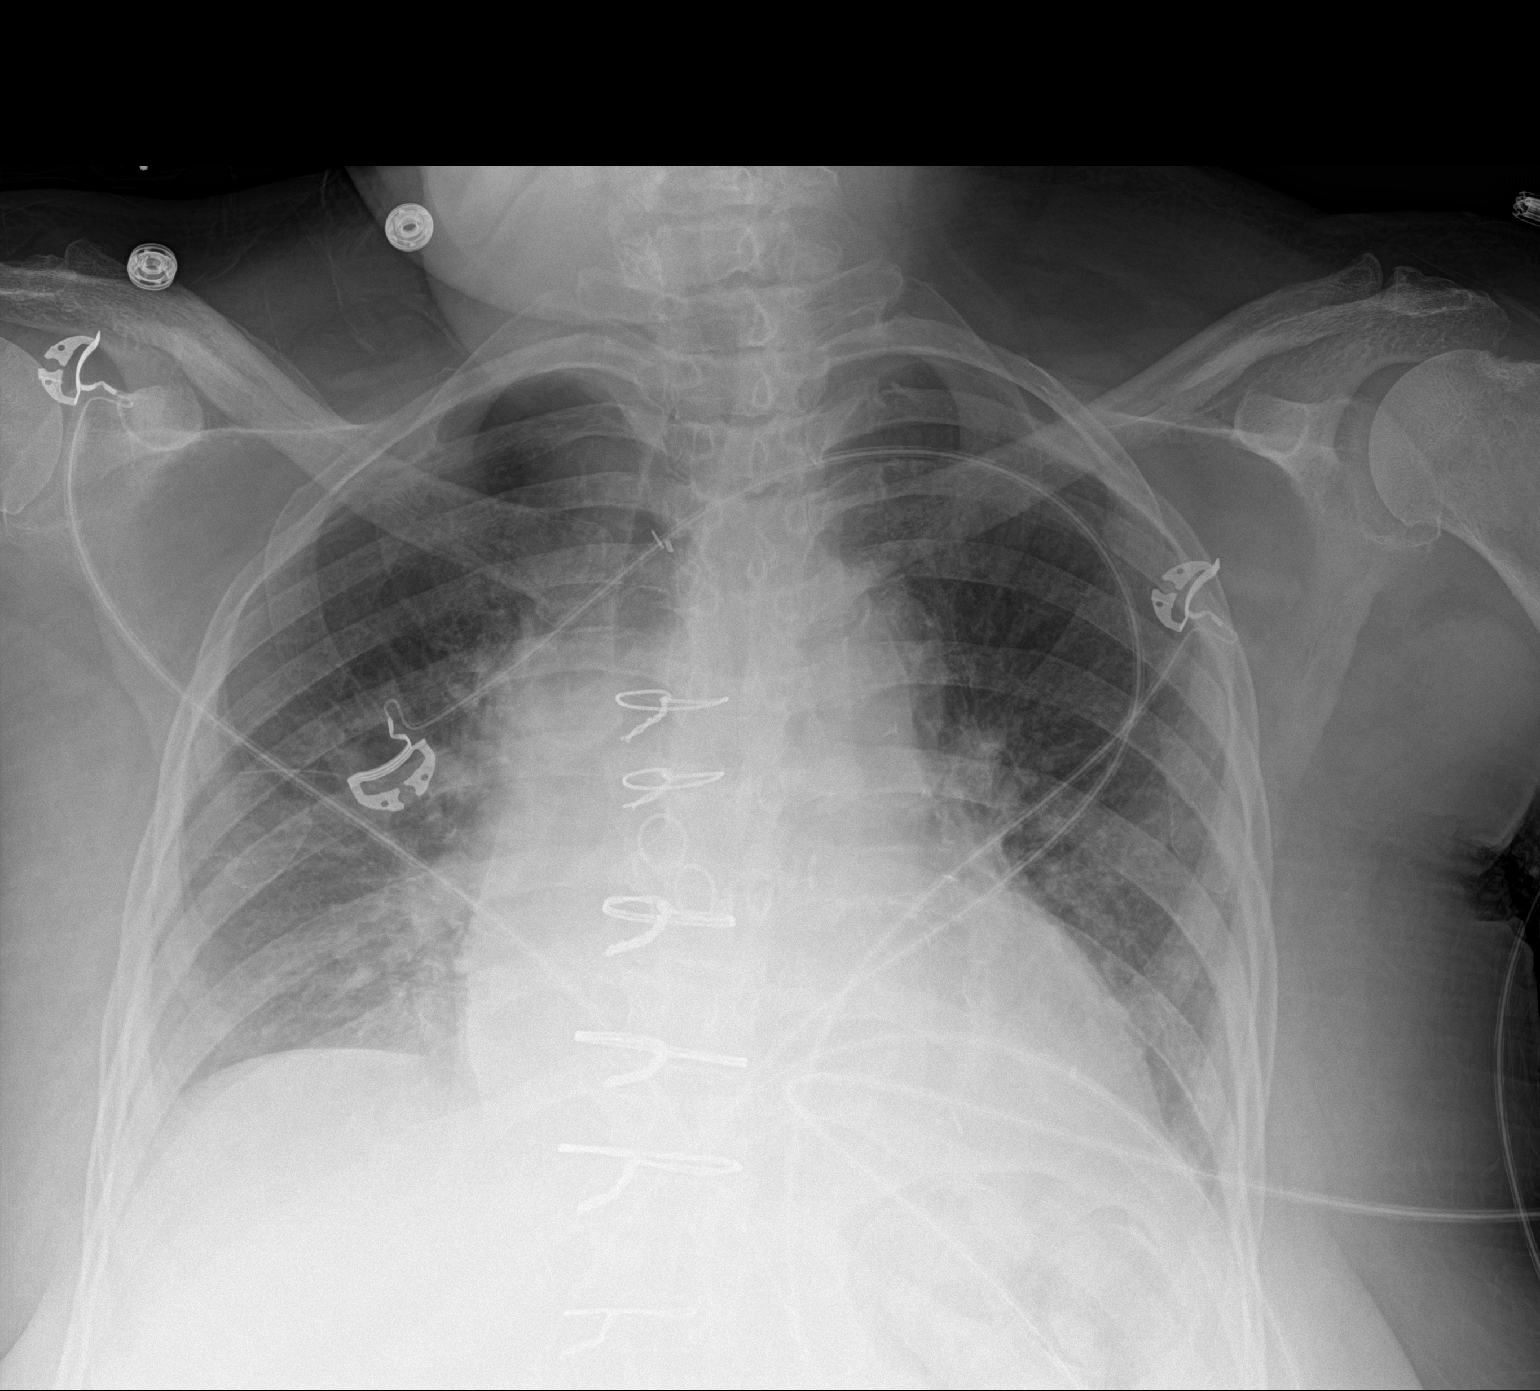

[1 of 1 positions shown; findings below may reference images not displayed]

FINDINGS: Interim removal of right IJ sheath. Prior CABG. Cardiomegaly with
diffuse bilateral from interstitial prominence suggesting CHF .
Interim improvement from prior exam. Tiny pleural effusions cannot
be excluded No pneumothorax.
IMPRESSION: 1. Interim removal of right IJ sheath.

2. Prior CABG. Cardiomegaly. Mild bilateral pulmonary interstitial
prominence consistent with interstitial edema, interim improvement
from prior exam.

## 2017-05-12 MED ORDER — POTASSIUM CHLORIDE CRYS ER 20 MEQ PO TBCR
20.0000 meq | EXTENDED_RELEASE_TABLET | Freq: Two times a day (BID) | ORAL | Status: DC
  Administered 2017-05-12 – 2017-05-15 (×7): 20 meq via ORAL
  Filled 2017-05-12 (×6): qty 1

## 2017-05-12 NOTE — Telephone Encounter (Signed)
Spoke with pt husband, he advised pt is hospitalized, and to call back in a couple weeks. I deferred order for August.

## 2017-05-12 NOTE — Discharge Summary (Signed)
76 year old female with past medical history of diabetes, hypertension and hyperlipidemia who was referred for outpatient cardiac catheterization for symptoms worrisome for unstable angina. She underwent a cardiac catheterization which showed:  1. Severe heavily calcified calcified left main stenosis extending into the ostium of the left circumflex with significant proximal LAD stenosis. Diffuse diabetic branch disease. 2. Mildly reduced LV systolic function with an EF of 45-50% with moderate mid to distal anterior and apical hypokinesis. 3. Severely elevated systemic hypertension with moderately elevated left ventricular end-diastolic pressure.  Given the patient's cath findings, severely elevated blood pressure with chest pain, the patient was transferred to [facility] for evaluation of CABG.

## 2017-05-12 NOTE — Progress Notes (Signed)
3 Days Post-Op Procedure(s) (LRB): CORONARY ARTERY BYPASS GRAFTING (CABG) x 3 using left internal mammary artery and right greater saphenous vein harvested endoscopically (N/A) INTRAOPERATIVE TRANSESOPHAGEAL ECHOCARDIOGRAM (N/A) Subjective: Progressing after CABG  Waiting for stepdown HBP, DM controlled nsr  Objective: Vital signs in last 24 hours: Temp:  [97.5 F (36.4 C)-98.4 F (36.9 C)] 98.4 F (36.9 C) (07/26 0759) Pulse Rate:  [61-82] 77 (07/26 0800) Cardiac Rhythm: Normal sinus rhythm (07/26 0800) Resp:  [15-27] 20 (07/26 0800) BP: (101-199)/(47-96) 153/62 (07/26 0800) SpO2:  [94 %-100 %] 95 % (07/26 0800) Weight:  [198 lb 10.2 oz (90.1 kg)] 198 lb 10.2 oz (90.1 kg) (07/26 0629)  Hemodynamic parameters for last 24 hours:  stable  Intake/Output from previous day: 07/25 0701 - 07/26 0700 In: 860 [P.O.:720; I.V.:140] Out: 2210 [Urine:2210] Intake/Output this shift: No intake/output data recorded.      Exam    General- alert and comfortable   Lungs- clear without rales, wheezes   Cor- regular rate and rhythm, no murmur , gallop   Abdomen- soft, non-tender   Extremities - warm, non-tender, minimal edema   Neuro- oriented, appropriate, no focal weakness   Lab Results:  Recent Labs  05/11/17 0529 05/12/17 0546  WBC 15.4* 14.1*  HGB 9.8* 11.2*  HCT 29.7* 33.6*  PLT 154 181   BMET:  Recent Labs  05/11/17 0529 05/12/17 0546  NA 136 136  K 3.9 3.3*  CL 104 100*  CO2 25 25  GLUCOSE 147* 159*  BUN 13 16  CREATININE 0.99 0.92  CALCIUM 8.6* 8.7*    PT/INR:  Recent Labs  05/09/17 1320  LABPROT 17.2*  INR 1.40   ABG    Component Value Date/Time   PHART 7.381 05/09/2017 1943   HCO3 24.6 05/09/2017 1943   TCO2 23 05/10/2017 1736   ACIDBASEDEF 1.0 05/09/2017 1943   O2SAT 97.0 05/09/2017 1943   CBG (last 3)   Recent Labs  05/11/17 2215 05/11/17 2301 05/12/17 0802  GLUCAP 91 99 160*    Assessment/Plan: S/P Procedure(s) (LRB): CORONARY  ARTERY BYPASS GRAFTING (CABG) x 3 using left internal mammary artery and right greater saphenous vein harvested endoscopically (N/A) INTRAOPERATIVE TRANSESOPHAGEAL ECHOCARDIOGRAM (N/A) Mobilize Diuresis Diabetes control Plan for transfer to step-down: see transfer orders   LOS: 10 days    Tharon Aquas Trigt III 05/12/2017

## 2017-05-12 NOTE — Progress Notes (Signed)
CARDIAC REHAB PHASE I   PRE:  Rate/Rhythm: 92 SR with PVC    BP: sitting 146/43    SaO2: 92 RA  MODE:  Ambulation: 150 ft   POST:  Rate/Rhythm: 101 ST with PVCs    BP: sitting 164/61     SaO2: 95 RA  Pt moving fairly well, used RW, steady with assist x1. However SOB and significant fatigue walking short distance. To recliner, VSS. However pt could not get comfortable therefore moved back to bed. Sts she did not get this tired on this am's walk. Will f/u. Encouraged x1 more walk and IS. [place], ACSM 05/12/2017 2:46 PM

## 2017-05-12 NOTE — Telephone Encounter (Signed)
-----   Message from Minna Merritts, MD sent at 05/10/2017  1:35 PM EDT ----- Regarding: RE: please advise Yes she needs a carotid ultrasound She had bruit on exam thx Tg ----- Message ----- From: Blain Pais Sent: 05/06/2017   3:14 PM To: Minna Merritts, MD Subject: please advise                                  Please advise if this pt needs a carotid ultrasound

## 2017-05-12 NOTE — Progress Notes (Signed)
Pt transferred from [facility]. Elevated BP so PRN Hydralazine given. Pt asymptomatic. Pt oriented to room and equipment. Lunch tray brought. Pt denies needs. Call bell within reach. Will continue to monitor.   Fritz Pickerel, RN

## 2017-05-13 ENCOUNTER — Inpatient Hospital Stay (HOSPITAL_COMMUNITY): Payer: Medicare Other

## 2017-05-13 ENCOUNTER — Other Ambulatory Visit (HOSPITAL_COMMUNITY): Payer: Medicare Other

## 2017-05-13 LAB — GLUCOSE, CAPILLARY
GLUCOSE-CAPILLARY: 87 mg/dL (ref 65–99)
GLUCOSE-CAPILLARY: 195 mg/dL — AB (ref 65–99)
GLUCOSE-CAPILLARY: 46 mg/dL — AB (ref 65–99)
GLUCOSE-CAPILLARY: 66 mg/dL (ref 65–99)
Glucose-Capillary: 269 mg/dL — ABNORMAL HIGH (ref 65–99)
Glucose-Capillary: 109 mg/dL — ABNORMAL HIGH (ref 65–99)

## 2017-05-13 LAB — CBC
HCT: 31 % — ABNORMAL LOW (ref 36.0–46.0)
Hemoglobin: 10.3 g/dL — ABNORMAL LOW (ref 12.0–15.0)
MCH: 28.2 pg (ref 26.0–34.0)
MCHC: 33.2 g/dL (ref 30.0–36.0)
MCV: 84.9 fL (ref 78.0–100.0)
Platelets: 198 10*3/uL (ref 150–400)
RBC: 3.65 MIL/uL — ABNORMAL LOW (ref 3.87–5.11)
RDW: 14.9 % (ref 11.5–15.5)
WBC: 10.2 10*3/uL (ref 4.0–10.5)

## 2017-05-13 LAB — BASIC METABOLIC PANEL
Anion gap: 7 (ref 5–15)
BUN: 17 mg/dL (ref 6–20)
CO2: 28 mmol/L (ref 22–32)
Calcium: 8.2 mg/dL — ABNORMAL LOW (ref 8.9–10.3)
Chloride: 100 mmol/L — ABNORMAL LOW (ref 101–111)
Creatinine, Ser: 0.93 mg/dL (ref 0.44–1.00)
GFR calc Af Amer: >60 mL/min (ref >60)
GFR calc non Af Amer: 59 mL/min — ABNORMAL LOW (ref >60)
Glucose, Bld: 175 mg/dL — ABNORMAL HIGH (ref 65–99)
Potassium: 3.3 mmol/L — ABNORMAL LOW (ref 3.5–5.1)
Sodium: 135 mmol/L (ref 135–145)

## 2017-05-13 IMAGING — CR DG CHEST 2V
2 series · 2 of 2 positions shown · non-contrast
Comparison: [DATE] .  [DATE].  CT [DATE].

CLINICAL DATA: CABG.

EXAM:
CHEST  2 VIEW

[chest pa]
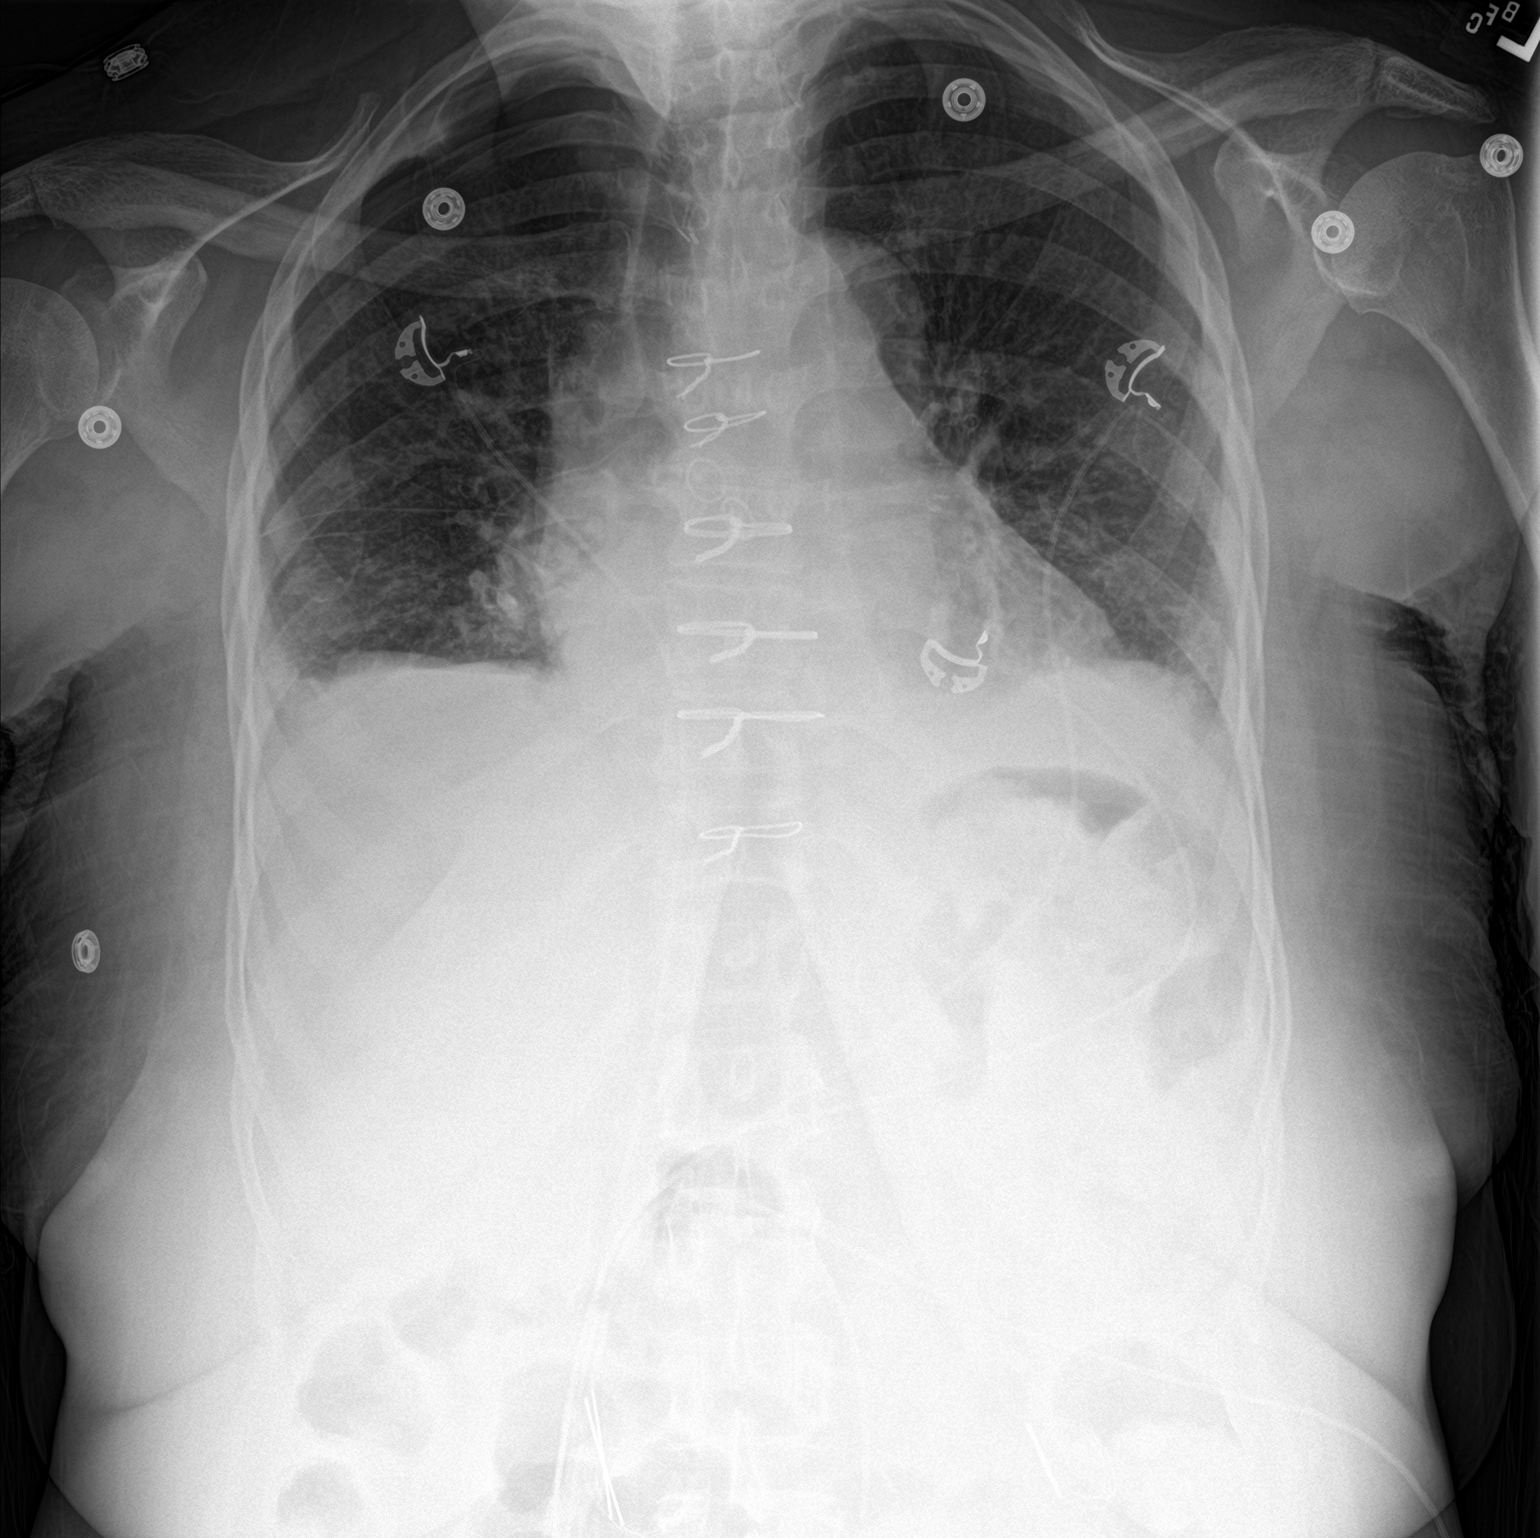

[chest lat]
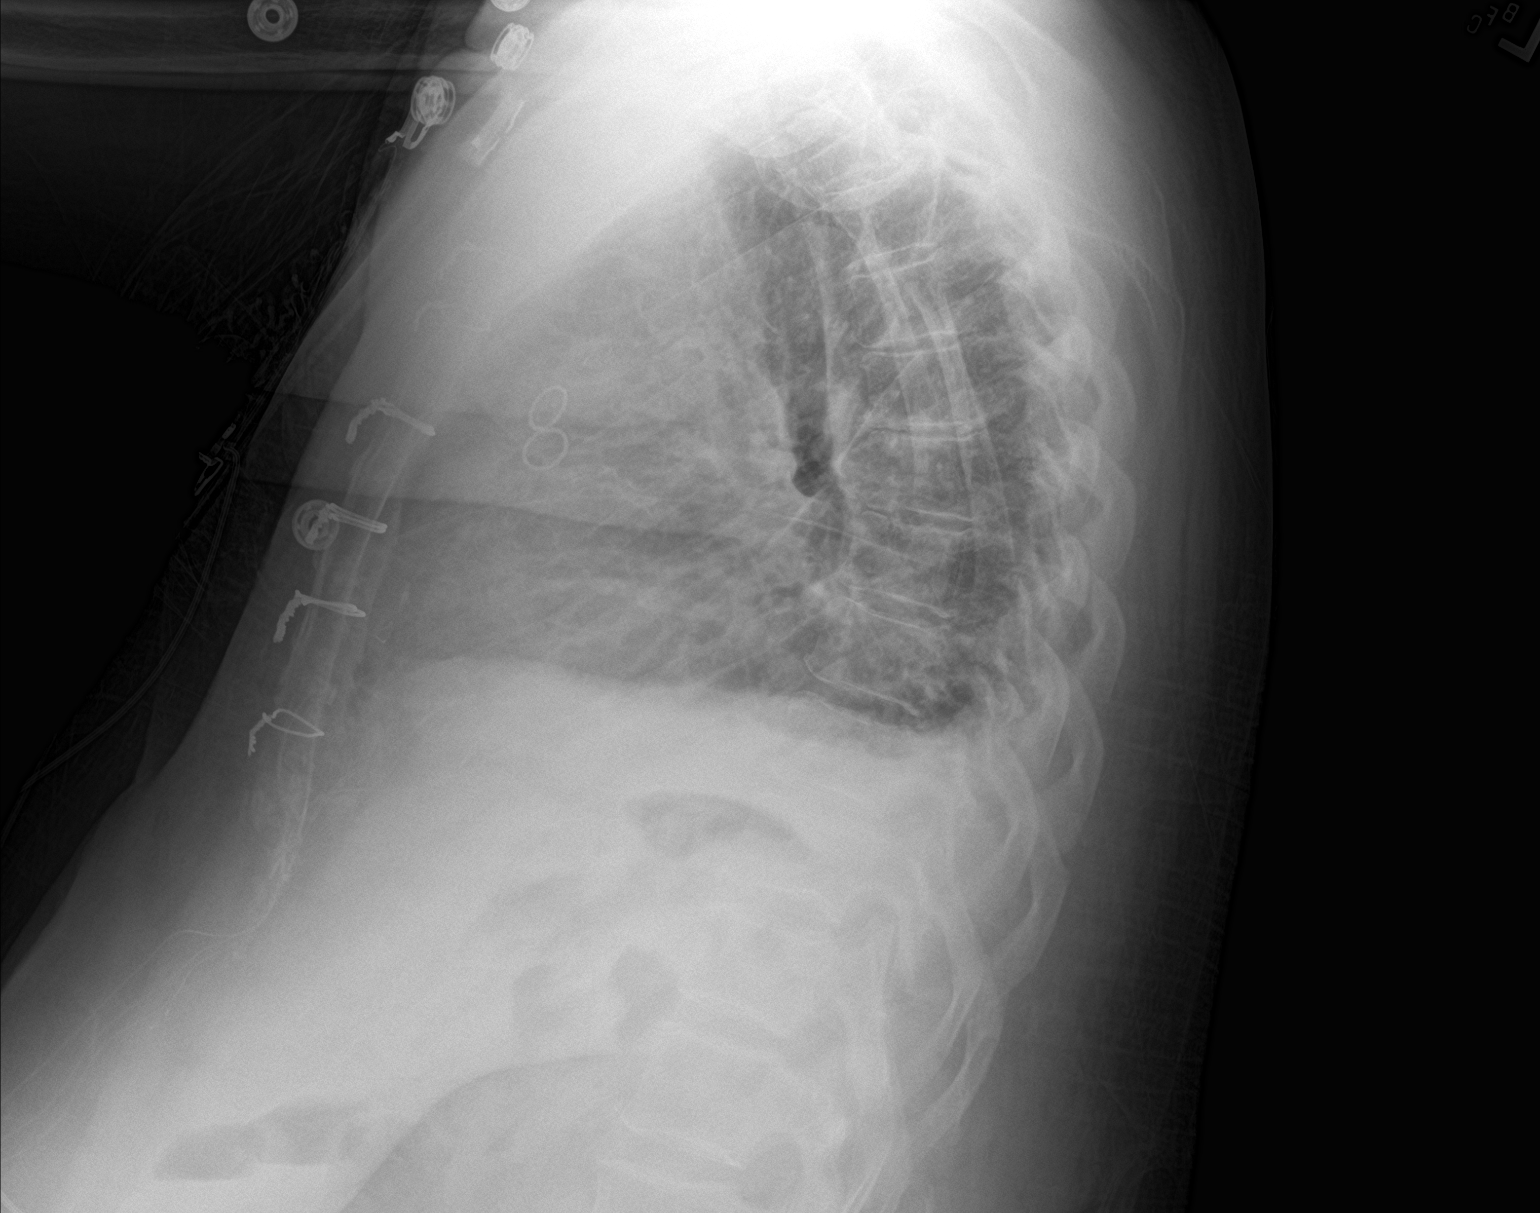

[2 of 2 positions shown; findings below may reference images not displayed]

FINDINGS: Prior CABG. Cardiomegaly with normal pulmonary vascularity. Interim
improvement aeration in the lung bases. Mild residual interstitial
edema. Small bilateral pleural effusions again noted . Stable right
apical pleural thickening.
IMPRESSION: Prior CABG. Stable cardiomegaly. Interim improvement of basilar
interstitial edema and small pleural effusions with mild residual.

## 2017-05-13 MED ORDER — INSULIN ASPART 100 UNIT/ML ~~LOC~~ SOLN
8.0000 [IU] | Freq: Three times a day (TID) | SUBCUTANEOUS | Status: DC
  Administered 2017-05-13 – 2017-05-15 (×4): 8 [IU] via SUBCUTANEOUS

## 2017-05-13 MED ORDER — POTASSIUM CHLORIDE CRYS ER 20 MEQ PO TBCR
40.0000 meq | EXTENDED_RELEASE_TABLET | Freq: Once | ORAL | Status: AC
  Administered 2017-05-13: 40 meq via ORAL
  Filled 2017-05-13: qty 2

## 2017-05-13 MED ORDER — LACTULOSE 10 GM/15ML PO SOLN
20.0000 g | Freq: Once | ORAL | Status: AC
  Administered 2017-05-13: 20 g via ORAL
  Filled 2017-05-13: qty 30

## 2017-05-13 MED ORDER — METOCLOPRAMIDE HCL 10 MG PO TABS
10.0000 mg | ORAL_TABLET | Freq: Four times a day (QID) | ORAL | Status: DC
  Administered 2017-05-14 – 2017-05-15 (×7): 10 mg via ORAL
  Filled 2017-05-13 (×7): qty 1

## 2017-05-13 NOTE — Care Management Important Message (Signed)
Important Message  Patient Details  Name: Carrie Kane MRN: [id number] Date of Birth: [birth year]   Medicare Important Message Given:  Yes    Karie Skowron Montine Circle 05/13/2017, 2:49 PM

## 2017-05-13 NOTE — Progress Notes (Signed)
Results for MAHOGONY, GILCHREST (MRN [id number]) as of 05/13/2017 09:42  Ref. Range 05/12/2017 08:02 05/12/2017 12:49 05/12/2017 15:49 05/12/2017 21:04 05/13/2017 06:35  Glucose-Capillary Latest Ref Range: 65 - 99 mg/dL 160 (H) 203 (H) 225 (H) 135 (H) 87  Noted that blood sugars have been less than 100 mg/dl.  Recommend changing Novolog correction scale to SENSITIVE TID & HS. Will continue to monitor blood sugars while in the hospital.   Harvel Ricks RN BSN CDE Diabetes Coordinator Pager: [phone]  8am-5pm

## 2017-05-13 NOTE — Progress Notes (Signed)
CARDIAC REHAB PHASE I   PRE:  Rate/Rhythm: 64 SR  BP:  Supine: 151/48  Sitting:   Standing:    SaO2: 96%RA   MODE:  Ambulation: 240 ft   POST:  Rate/Rhythm: 83SR  BP:  Supine:   Sitting: 184/68  Standing:    SaO2: 98%RA 1130-1155 Pt walked 240 ft on RA with rolling walker and asst x 1. Recommend walker for home use. Notified case Freight forwarder. To recliner after walk. Tired. BP elevated. Notified RN. Pt wants husband here for ed. Will hold ed at this time as husband not here.    Graylon Good, RN BSN  05/13/2017 11:52 AM

## 2017-05-13 NOTE — Discharge Summary (Signed)
Physician Discharge Summary       [address]       ,[place] [address]             [phone]    Patient ID: Carrie Kane MRN: [id number] DOB/AGE: December 03, 1940 76 y.o.  Admit date: 05/02/2017 Discharge date: 05/15/2017  Admission Diagnoses: 1. Unstable angina ([place]) 2. S/p NSTEMI 3. Coronary artery disease  Active Diagnoses:  1. Diabetes ([place]) 2. HBP (high blood pressure) 3. Obesity 4. ABL anemia   Procedure (s):  Left Heart Cath and Coronary Angiography by Dr. Fletcher Anon on 05/02/2017:  Conclusion     LM lesion, 80 %stenosed.  Ost Cx to Prox Cx lesion, 95 %stenosed.  Mid Cx lesion, 70 %stenosed.  Mid LAD lesion, 30 %stenosed.  Prox LAD lesion, 80 %stenosed.  There is mild left ventricular systolic dysfunction.  LV end diastolic pressure is moderately elevated.  The left ventricular ejection fraction is 45-50% by visual estimate.   1. Severe heavily calcified calcified left main stenosis extending into the ostium of the left circumflex with significant proximal LAD stenosis. Diffuse diabetic branch disease. 2. Mildly reduced LV systolic function with an EF of 45-50% with moderate mid to distal anterior and apical hypokinesis. 3. Severely elevated systemic hypertension with moderately elevated left ventricular end-diastolic pressure.  Recommendations: Given the patient's cath findings, severely elevated blood pressure with chest pain, recommend hospital admission for blood pressure control and revascularization. Recommend CABG. I switched labetalol to carvedilol. The patient is not truly allergic to aspirin but she reports previous retinal bleeding on antiplatelet medications. I'm going to start small dose aspirin 81 mg once daily as I feel that the benefits outweigh the risks at this time.    1. Coronary artery bypass grafting x3 (left internal mammary artery to     left anterior descending artery, saphenous vein graft to diagonal,  saphenous vein graft to the obtuse marginal). 2. Endoscopic harvest of right leg greater saphenous vein by Dr. Prescott Gum on 05/09/2017.  History of Presenting Illness: This is a 76 year old African American obese diabetic nonsmoker with hypertension and recent symptoms of chest pain consistent with acute coronary syndrome. She notably developed substernal chest pain during a stress test which had to be terminated. She subsequently underwent a outpatient echo at a different institution which by report demonstrated ejection fraction 45% with anteroapical hypokinesia. Today she underwent a scheduled outpatient cardiac catheterization which demonstrated severe multivessel CAD. Ejection fraction of 45-50 percent and LVEDP was 27-28. Due to her coronary anatomy and diabetes she is felt to be candidate for surgical coronary revascularization.  The patient was transferred to this institution is now on IV heparin. She denies any chest pain during or after the procedure today. Cardiac catheterization access was via right femoral artery without complications.  Dr. Prescott Gum discussed the need for coronary artery bypass grafting surgery. Potential risks, benefits, and complications of the surgery were discussed with the patient and she agreed to proceed with surgery. Pre operative carotid duplex showed no significant internal carotid artery stenosis bilaterally. She underwent a CABG x 3 on 05/09/2017.  Brief Hospital Course:  The patient was extubated the evening of surgery without difficulty. He remained afebrile and hemodynamically stable. She was initially V paced and weaned off of Milrinone drip. Gordy Councilman, a line, chest tubes, and foley were removed early in the post operative course. Lopressor was started and titrated accordingly. She was hypertensive and was restarted on Clonidine patch and Benazepril for better  BP control. She was volume over loaded and diuresed. She had ABL anemia. She did not require a  post op transfusion. Last H and H was 10.3 and 31 . She was weaned off the insulin drip.   The patient's glucose remained well controlled.The patient's HGA1C pre op was 8.4. She will require close medical follow up after discharge. The patient was felt surgically stable for transfer from the ICU to [facility]U for further convalescence on 05/12/2017. She continues to progress with cardiac rehab. She was ambulating on room air. She has been tolerating a diet and has had a bowel movement. Epicardial pacing wires were removed on 05/13/2017. Chest tube sutures will be removed the day of discharge. The patient is felt surgically stable for discharge today.   Latest Vital Signs: Blood pressure (!) 147/49, pulse 72, temperature 98.7 F (37.1 C), temperature source Oral, resp. rate 20, height 5\' 8"  (1.727 m), weight 87.6 kg (193 lb 3.2 oz), SpO2 100 %.  Physical Exam: Cardiovascular: RRR Pulmonary: Slightly diminished at bases Abdomen: Soft, non tender, bowel sounds present. Extremities: Mild bilateral lower extremity edema. Wounds: Clean and dry.  No erythema or signs of infection.  Discharge Condition:Stable and discharged to home.  Recent laboratory studies:  Lab Results  Component Value Date   WBC 10.2 05/13/2017   HGB 10.3 (L) 05/13/2017   HCT 31.0 (L) 05/13/2017   MCV 84.9 05/13/2017   PLT 198 05/13/2017   Lab Results  Component Value Date   NA 135 05/13/2017   K 3.3 (L) 05/13/2017   CL 100 (L) 05/13/2017   CO2 28 05/13/2017   CREATININE 0.93 05/13/2017   GLUCOSE 175 (H) 05/13/2017    Diagnostic Studies: Dg Chest 2 View  Result Date: 05/13/2017 CLINICAL DATA:  CABG. EXAM: CHEST  2 VIEW COMPARISON:  05/12/2017 .  04/29/2017.  CT 10/04/2004. FINDINGS: Prior CABG. Cardiomegaly with normal pulmonary vascularity. Interim improvement aeration in the lung bases. Mild residual interstitial edema. Small bilateral pleural effusions again noted . Stable right apical pleural thickening. IMPRESSION:  Prior CABG. Stable cardiomegaly. Interim improvement of basilar interstitial edema and small pleural effusions with mild residual. Electronically Signed   By: Marcello Moores  Register   On: 05/13/2017 07:31   Discharge Instructions    Discharge patient    Complete by:  As directed    Discharge disposition:  01-Home or Self Care   Discharge patient date:  05/15/2017     Discharge Medications: Allergies as of 05/15/2017      Reactions   Flexeril [cyclobenzaprine] Hypertension      Medication List    STOP taking these medications   fluvastatin 40 MG capsule Commonly known as:  LESCOL   labetalol 100 MG tablet Commonly known as:  NORMODYNE   nitroGLYCERIN 0.4 MG SL tablet Commonly known as:  NITROSTAT     TAKE these medications   ALLERGY PO Take 1 tablet by mouth daily as needed (allergies).   alum & mag hydroxide-simeth 200-200-20 MG/5ML suspension Commonly known as:  MAALOX/MYLANTA Take 15 mLs by mouth every 6 (six) hours as needed for indigestion or heartburn.   aspirin 325 MG EC tablet Take 1 tablet (325 mg total) by mouth daily.   atorvastatin 40 MG tablet Commonly known as:  LIPITOR Take 1 tablet (40 mg total) by mouth daily at 6 PM.   benazepril-hydrochlorthiazide 20-12.5 MG tablet Commonly known as:  LOTENSIN HCT Take 1 tablet by mouth 2 (two) times daily.   cholecalciferol 1000 units tablet Commonly  known as:  VITAMIN D Take 1,000 Units by mouth daily at 12 noon.   cloNIDine 0.3 MG tablet Commonly known as:  CATAPRES Take 0.3 mg by mouth 2 (two) times daily.   insulin glargine 100 UNIT/ML injection Commonly known as:  LANTUS Inject 45 Units into the skin at bedtime.   insulin lispro 100 UNIT/ML injection Commonly known as:  HUMALOG Inject 12-14 Units into the skin 2 (two) times daily. 12 units with lunch and 14 units with dinner   insulin lispro protamine-lispro (75-25) 100 UNIT/ML Susp injection Commonly known as:  HUMALOG 75/25 MIX Inject 19-38 Units into  the skin daily with breakfast.   levothyroxine 100 MCG tablet Commonly known as:  SYNTHROID, LEVOTHROID Take 100 mcg by mouth daily before breakfast.   metoprolol tartrate 25 MG tablet Commonly known as:  LOPRESSOR Take 1 tablet (25 mg total) by mouth 2 (two) times daily.   multivitamin capsule Take 1 capsule by mouth daily at 12 noon.   oxyCODONE 5 MG immediate release tablet Commonly known as:  Oxy IR/ROXICODONE Take 1 tablet (5 mg total) by mouth every 4 (four) hours as needed for severe pain.   SYSTANE OP Apply 1 drop to eye as needed (dry eyes).      The patient has been discharged on:   1.Beta Blocker:  Yes [  x ]                              No   [   ]                              If No, reason:  2.Ace Inhibitor/ARB: Yes [  x ]                                     No  [    ]                                     If No, reason:  3.Statin:   Yes [x   ]                  No  [   ]                  If No, reason:  4.Ecasa:  Yes  [ x  ]                  No   [   ]                  If No, reason:  Follow Up Appointments: Follow-up Information    Cletis Athens, MD. Call.   Specialty:  Internal Medicine Why:  Call for a follow up appointment regarding further diabetes management and surveillance of HGA1C 8.4 Contact information: [place] [address] [phone]        Ivin Poot, MD Follow up on 06/15/2017.   Specialty:  Cardiothoracic Surgery Why:  PA/LAT CXR to be taken (at [facility] which is in the same building as Dr. Lucianne Lei Trigt's office) on 06/15/2017 at 10:30 am;Appointment time is at 11:00 am  Contact information: [address] [place] [address] [phone]        Minna Merritts, MD Follow up.   Specialty:  Cardiology Contact information: [place] [address] [phone]           Signed: Terance Hart ContePA-C 05/15/2017, 10:21 AM

## 2017-05-13 NOTE — Progress Notes (Addendum)
      [address]       [address],[place] [address]             [phone]        4 Days Post-Op Procedure(s) (LRB): CORONARY ARTERY BYPASS GRAFTING (CABG) x 3 using left internal mammary artery and right greater saphenous vein harvested endoscopically (N/A) INTRAOPERATIVE TRANSESOPHAGEAL ECHOCARDIOGRAM (N/A)  Subjective: Patient has not had a bowel movement  Objective: Vital signs in last 24 hours: Temp:  [98.2 F (36.8 C)-98.8 F (37.1 C)] 98.6 F (37 C) (07/27 0412) Pulse Rate:  [74-89] 75 (07/27 0412) Cardiac Rhythm: Normal sinus rhythm (07/27 0700) Resp:  [13-22] 18 (07/27 0412) BP: (105-188)/(50-77) 141/77 (07/27 0412) SpO2:  [93 %-98 %] 94 % (07/27 0412) Weight:  [88.9 kg (196 lb)-90.7 kg (200 lb)] 88.9 kg (196 lb) (07/27 0517)  Pre op weight 85.8 kg Current Weight  05/13/17 88.9 kg (196 lb)      Intake/Output from previous day: 07/26 0701 - 07/27 0700 In: 720 [P.O.:720] Out: -    Physical Exam:  Cardiovascular: RRR Pulmonary: Slightly diminished at bases Abdomen: Soft, non tender, bowel sounds present. Extremities: Mild bilateral lower extremity edema. Wounds: Clean and dry.  No erythema or signs of infection.  Lab Results: CBC: Recent Labs  05/12/17 0546 05/13/17 0223  WBC 14.1* 10.2  HGB 11.2* 10.3*  HCT 33.6* 31.0*  PLT 181 198   BMET:  Recent Labs  05/12/17 0546 05/13/17 0223  NA 136 135  K 3.3* 3.3*  CL 100* 100*  CO2 25 28  GLUCOSE 159* 175*  BUN 16 17  CREATININE 0.92 0.93  CALCIUM 8.7* 8.2*    PT/INR:  Lab Results  Component Value Date   INR 1.40 05/09/2017   INR 1.09 05/03/2017   INR 0.98 04/28/2017   ABG:  INR: Will add last result for INR, ABG once components are confirmed Will add last 4 CBG results once components are confirmed  Assessment/Plan:  1. CV - S/p NSTEMI. Maintaining SR in the 70's this am. On Lopressor 12.5 mg bid, Clonidine patch, and Benazepril 10 mg bid. Monitor BP as may need  further titration of medications. 2.  Pulmonary - On room air. CXR shows no pneumothorax, cardiomegaly, small bilateral pleural effusions. Encourage incentive spirometer. 3. Volume Overload - On Zaroxolyn 5 mg daily and Lasix 40 mg daily 4.  Acute blood loss anemia - H and H slightly decreased to 10.3 and 31. 5. Supplement potassium 6. DM-CBGs 225/135/87. On Insulin. Pre op HGA1C 8.4. She will need close medical follow up after discharge. 7. Remove EPW 8. LOC constipation 9. Hope to discharge 1-2 days  ZIMMERMAN,DONIELLE MPA-C 05/13/2017,7:17 AM

## 2017-05-13 NOTE — Progress Notes (Signed)
EPWs DC'd per order and unit protocol.  Pt tolerated well, all tips intact, sites unremarkable.  Pt understands bedrest for one hr with frequent VS.  CCMD notified, will monitor closely.

## 2017-05-14 LAB — GLUCOSE, CAPILLARY
GLUCOSE-CAPILLARY: 341 mg/dL — AB (ref 65–99)
GLUCOSE-CAPILLARY: 48 mg/dL — AB (ref 65–99)
Glucose-Capillary: 107 mg/dL — ABNORMAL HIGH (ref 65–99)
Glucose-Capillary: 186 mg/dL — ABNORMAL HIGH (ref 65–99)
Glucose-Capillary: 79 mg/dL (ref 65–99)

## 2017-05-14 MED ORDER — METOPROLOL TARTRATE 25 MG/10 ML ORAL SUSPENSION
12.5000 mg | Freq: Two times a day (BID) | ORAL | Status: DC
  Filled 2017-05-14 (×2): qty 10

## 2017-05-14 MED ORDER — METOPROLOL TARTRATE 25 MG PO TABS
25.0000 mg | ORAL_TABLET | Freq: Two times a day (BID) | ORAL | Status: DC
  Administered 2017-05-14 – 2017-05-15 (×3): 25 mg via ORAL
  Filled 2017-05-14 (×4): qty 1

## 2017-05-14 NOTE — Progress Notes (Signed)
Patient setting in chair. C/Co of being sweaty, tired,calmey. Accu ck was 46 Patient started eating her Dinner and repeat accu.ck at 20:05 was 66 Patient still eating dinner. repeat Accu ck. At 20:37 109 and feels better. Patient had received Insulin earlier tonight at 17:00 and did not eat her dinner til 8 P.M. R.N. Aware.

## 2017-05-14 NOTE — Discharge Instructions (Signed)

## 2017-05-14 NOTE — Progress Notes (Addendum)
      [address]       [place] [address]             [phone]      5 Days Post-Op Procedure(s) (LRB): CORONARY ARTERY BYPASS GRAFTING (CABG) x 3 using left internal mammary artery and right greater saphenous vein harvested endoscopically (N/A) INTRAOPERATIVE TRANSESOPHAGEAL ECHOCARDIOGRAM (N/A) Subjective: States that her sugar got low last night since she didn't eat her dinner right away. She feels better this morning.   Objective: Vital signs in last 24 hours: Temp:  [98.1 F (36.7 C)-98.4 F (36.9 C)] 98.4 F (36.9 C) (07/28 0444) Pulse Rate:  [76-95] 95 (07/28 0444) Cardiac Rhythm: Normal sinus rhythm;Bundle branch block (07/27 1901) Resp:  [19-23] 20 (07/28 0444) BP: (143-164)/(57-85) 149/69 (07/28 0444) SpO2:  [93 %-100 %] 100 % (07/28 0444) Weight:  [87.2 kg (192 lb 4.8 oz)] 87.2 kg (192 lb 4.8 oz) (07/28 0444)     Intake/Output from previous day: 07/27 0701 - 07/28 0700 In: 960 [P.O.:960] Out: 800 [Urine:800] Intake/Output this shift: No intake/output data recorded.  General appearance: alert, cooperative and no distress Heart: regular rate and rhythm, S1, S2 normal, no murmur, click, rub or gallop Lungs: clear to auscultation bilaterally Abdomen: soft, non-tender; bowel sounds normal; no masses,  no organomegaly Extremities: extremities normal, atraumatic, no cyanosis or edema Wound: c/d/i, breast binder in place  Lab Results:  Recent Labs  05/12/17 0546 05/13/17 0223  WBC 14.1* 10.2  HGB 11.2* 10.3*  HCT 33.6* 31.0*  PLT 181 198   BMET:  Recent Labs  05/12/17 0546 05/13/17 0223  NA 136 135  K 3.3* 3.3*  CL 100* 100*  CO2 25 28  GLUCOSE 159* 175*  BUN 16 17  CREATININE 0.92 0.93  CALCIUM 8.7* 8.2*    PT/INR: No results for input(s): LABPROT, INR in the last 72 hours. ABG    Component Value Date/Time   PHART 7.381 05/09/2017 1943   HCO3 24.6 05/09/2017 1943   TCO2 23 05/10/2017 1736   ACIDBASEDEF 1.0 05/09/2017  1943   O2SAT 97.0 05/09/2017 1943   CBG (last 3)   Recent Labs  05/13/17 2005 05/13/17 2037 05/14/17 0629  GLUCAP 66 109* 107*    Assessment/Plan: S/P Procedure(s) (LRB): CORONARY ARTERY BYPASS GRAFTING (CABG) x 3 using left internal mammary artery and right greater saphenous vein harvested endoscopically (N/A) INTRAOPERATIVE TRANSESOPHAGEAL ECHOCARDIOGRAM (N/A)  1. CV-NSR in the 80s to 90s. BP climbing. Will increase her Lopressor to  25mg  BID. On Clonidine patch and Benazepril.  2. Pulm-Tolerating room air with good oxygen saturations.  3. Renal- remains fluid overloaded. Weight trending down. Continue Lasix and Zaroxolyn.  4. Acute blood loss anemia-H and H stable. Platelets trending up. 5. Endo-hypoglycemic events noted. Appears to have received insulin hours before meal was eaten. Diabetes educator consulted and titration insulin.  6. Constipation-+ flatus  Plan: Increase Lopressor. Increase ambulation, Encourage incentive spirometry. Likely home tomorrow if remains stable.    LOS: 12 days    Elgie Collard 05/14/2017 Agree with above assessment and plan. I have personally examined and spoken with the patient Patient appears to be ready for discharge in a.m. Discharge instructions have been reviewed with patient Tharon Aquas trigt M.D.

## 2017-05-14 NOTE — Progress Notes (Signed)
CARDIAC REHAB PHASE I   PRE:  Rate/Rhythm: 83 SR  BP:  Sitting: 132/59        SaO2: 96 RA  MODE:  Ambulation: 470 ft   POST:  Rate/Rhythm: 89 SR  BP:  Sitting: 138/60         SaO2: 99 RA  Pt ambulated 470 ft on RA, rolling walker, assist x1, steady gait, tolerated well with no complaints. Cardiac surgery discharge education completed with pt and husband at bedside. Reviewed risk factors, IS, sternal precautions, activity progression, exercise, heart healthy and diabetes diet handouts, daily weights and phase 2 cardiac rehab. Pt verbalized understanding. Pt interested in phase 2 cardiac rehab referral, Dr. Rockey Situ is pt's cardiologist, will send letter of interest to [facility]. Pt to bed per pt request after walk, call bell within reach.  [place], RN, BSN 05/14/2017 2:27 PM

## 2017-05-15 LAB — GLUCOSE, CAPILLARY
GLUCOSE-CAPILLARY: 170 mg/dL — AB (ref 65–99)
GLUCOSE-CAPILLARY: 174 mg/dL — AB (ref 65–99)
GLUCOSE-CAPILLARY: 205 mg/dL — AB (ref 65–99)
Glucose-Capillary: 189 mg/dL — ABNORMAL HIGH (ref 65–99)

## 2017-05-15 MED ORDER — OXYCODONE HCL 5 MG PO TABS: 5.0000 mg | ORAL_TABLET | ORAL | 0 refills | Status: DC | PRN

## 2017-05-15 MED ORDER — BENAZEPRIL HCL 5 MG PO TABS
20.0000 mg | ORAL_TABLET | Freq: Two times a day (BID) | ORAL | Status: DC
  Administered 2017-05-15: 20 mg via ORAL
  Filled 2017-05-15: qty 4

## 2017-05-15 MED ORDER — ASPIRIN 325 MG PO TBEC: 325.0000 mg | DELAYED_RELEASE_TABLET | Freq: Every day | ORAL | 0 refills | Status: DC

## 2017-05-15 MED ORDER — METOPROLOL TARTRATE 25 MG PO TABS: 25.0000 mg | ORAL_TABLET | Freq: Two times a day (BID) | ORAL | 1 refills | Status: DC

## 2017-05-15 MED ORDER — ATORVASTATIN CALCIUM 40 MG PO TABS: 40.0000 mg | ORAL_TABLET | Freq: Every day | ORAL | 1 refills | Status: DC

## 2017-05-15 NOTE — Progress Notes (Signed)
Patient's discharge instructions given along with prescriptions. Patient verbalized understanding when told about her follow up appointments and education in discharge packet. IV removed and telemetry d/c'd. Waiting on family for discharge.

## 2017-05-15 NOTE — Progress Notes (Signed)
      [address]       [place] [address]             [phone]      6 Days Post-Op Procedure(s) (LRB): CORONARY ARTERY BYPASS GRAFTING (CABG) x 3 using left internal mammary artery and right greater saphenous vein harvested endoscopically (N/A) INTRAOPERATIVE TRANSESOPHAGEAL ECHOCARDIOGRAM (N/A) Subjective: Feels okay this morning. She states her blood sugar gets low sometimes at home.   Objective: Vital signs in last 24 hours: Temp:  [98.5 F (36.9 C)-98.8 F (37.1 C)] 98.7 F (37.1 C) (07/29 0459) Pulse Rate:  [82-88] 82 (07/28 2132) Cardiac Rhythm: Normal sinus rhythm (07/29 0700) Resp:  [20] 20 (07/28 1624) BP: (151-177)/(61-64) 177/61 (07/28 2132) SpO2:  [100 %] 100 % (07/28 1624) Weight:  [87.6 kg (193 lb 3.2 oz)] 87.6 kg (193 lb 3.2 oz) (07/29 0459)     Intake/Output from previous day: 07/28 0701 - 07/29 0700 In: 600 [P.O.:600] Out: 1700 [Urine:1700] Intake/Output this shift: No intake/output data recorded.  General appearance: alert, cooperative and no distress Heart: regular rate and rhythm, S1, S2 normal, no murmur, click, rub or gallop Lungs: clear to auscultation bilaterally Abdomen: soft, non-tender; bowel sounds normal; no masses,  no organomegaly Extremities: extremities normal, atraumatic, no cyanosis or edema Wound: clean and dry  Lab Results:  Recent Labs  05/13/17 0223  WBC 10.2  HGB 10.3*  HCT 31.0*  PLT 198   BMET:  Recent Labs  05/13/17 0223  NA 135  K 3.3*  CL 100*  CO2 28  GLUCOSE 175*  BUN 17  CREATININE 0.93  CALCIUM 8.2*    PT/INR: No results for input(s): LABPROT, INR in the last 72 hours. ABG    Component Value Date/Time   PHART 7.381 05/09/2017 1943   HCO3 24.6 05/09/2017 1943   TCO2 23 05/10/2017 1736   ACIDBASEDEF 1.0 05/09/2017 1943   O2SAT 97.0 05/09/2017 1943   CBG (last 3)   Recent Labs  05/15/17 0035 05/15/17 0558 05/15/17 0633  GLUCAP 170* 174* 189*     Assessment/Plan: S/P Procedure(s) (LRB): CORONARY ARTERY BYPASS GRAFTING (CABG) x 3 using left internal mammary artery and right greater saphenous vein harvested endoscopically (N/A) INTRAOPERATIVE TRANSESOPHAGEAL ECHOCARDIOGRAM (N/A)  1. CV-NSR in the 60s. BP remains elevated. On Clonidine patch and Benazepril. Will revert to her home dose of Benazepril/HCTZ on discharge.  2. Pulm-Tolerating room air with good oxygen saturations.  3. Renal- creatinine stable. Weight stable.  4. Acute blood loss anemia-H and H stable. Platelets trending up. 5. Endo-hypoglycemic again last night.We will revert back to her home dose on discharge and encourage more frequent blood glucose readings. A1C is 8.4, will need outpatient follow-up.  6. Constipation-+ flatus  Plan: Home later today if remains stable. Will prescribe home dose insulin and home dose Benazepril/HCTZ on discharge. Continue to encourage incentive spirometer. Ambulate.    LOS: 13 days    Elgie Collard 05/15/2017

## 2017-05-15 NOTE — Care Management Note (Signed)
Case Management Note Previous CM note initiated by Zenon Mayo, RN--05/10/2017, 5:36 PM   Patient Details  Name: Carrie Kane MRN: [id number] Date of Birth: 07/17/1941  Subjective/Objective:  Per previous NCM note, Pt presented for Unstable Angina. [place] 05/02/17 at [facility] showed severe multivessel coronary artery disease . CVTS consulted and plan for CABG on Monday 05-09-17. Pt is from home with husband and PTA- pt was independent.   7/24 Carrie Bamberger RN, BSN- POD 1 CABG, dc chest tubes, diuresis.                    Action/Plan: NCM will follow for dc needs.   Expected Discharge Date:  05/15/17               Expected Discharge Plan:  [place]  In-House Referral:  NA  Discharge planning Services  CM Consult  Post Acute Care Choice:  Durable Medical Equipment Choice offered to:  Patient  DME Arranged:  [place] rolling DME Agency:  [place]:  NA [place] Agency:  NA  Status of Service:  Completed, signed off  If discussed at [place] of Stay Meetings, dates discussed:    Discharge Disposition: home/self care   Additional Comments:  05/15/17- 1120- Carrie Nibert RN CM- pt for d/c home today- per conversation with pt she still would like a RW for home- order placed and notified Brad with [facility] for DME needs- RW to be delivered to room prior to discharge-  Pt also requested a private duty list for extra assistance at home- copy of list provided to pt at bedside.   Dahlia Client Alice, RN 05/15/2017, 11:26 AM [phone]

## 2017-05-16 ENCOUNTER — Telehealth: Payer: Self-pay | Admitting: Cardiovascular Disease

## 2017-05-16 NOTE — Telephone Encounter (Signed)
Attempted to call patient and schedule fu with Dr Fletcher Anon  No answer no vm Will try again at a later time

## 2017-05-16 NOTE — Telephone Encounter (Signed)
-----   Message from Isaiah Serge, NP sent at 05/16/2017  1:26 PM EDT ----- Needs follow up in 2 weeks post CABG, please call her she has been discharged.  thanks

## 2017-05-17 NOTE — Telephone Encounter (Signed)
Pt scheduled for 06/03/17 to see Ignacia Bayley   Nothing further needed.

## 2017-05-19 ENCOUNTER — Other Ambulatory Visit: Payer: Self-pay | Admitting: *Deleted

## 2017-06-03 ENCOUNTER — Ambulatory Visit (INDEPENDENT_AMBULATORY_CARE_PROVIDER_SITE_OTHER): Payer: Medicare Other | Admitting: Nurse Practitioner

## 2017-06-03 ENCOUNTER — Encounter: Payer: Self-pay | Admitting: Nurse Practitioner

## 2017-06-03 VITALS — BP 90/52 | HR 69 | Ht 68.5 in | Wt 181.5 lb

## 2017-06-03 DIAGNOSIS — E876 Hypokalemia: Secondary | ICD-10-CM

## 2017-06-03 DIAGNOSIS — E785 Hyperlipidemia, unspecified: Secondary | ICD-10-CM

## 2017-06-03 DIAGNOSIS — I2510 Atherosclerotic heart disease of native coronary artery without angina pectoris: Secondary | ICD-10-CM

## 2017-06-03 DIAGNOSIS — I200 Unstable angina: Secondary | ICD-10-CM | POA: Diagnosis not present

## 2017-06-03 DIAGNOSIS — I10 Essential (primary) hypertension: Secondary | ICD-10-CM | POA: Diagnosis not present

## 2017-06-03 MED ORDER — NITROGLYCERIN 0.4 MG SL SUBL: 0.4000 mg | SUBLINGUAL_TABLET | SUBLINGUAL | 12 refills | Status: DC | PRN

## 2017-06-03 MED ORDER — CLONIDINE HCL 0.2 MG PO TABS: 0.2000 mg | ORAL_TABLET | Freq: Two times a day (BID) | ORAL | 1 refills | Status: DC

## 2017-06-03 MED ORDER — METOPROLOL TARTRATE 25 MG PO TABS: 25.0000 mg | ORAL_TABLET | Freq: Two times a day (BID) | ORAL | 1 refills | Status: DC

## 2017-06-03 MED ORDER — ATORVASTATIN CALCIUM 40 MG PO TABS: 40.0000 mg | ORAL_TABLET | Freq: Every day | ORAL | 1 refills | Status: DC

## 2017-06-03 NOTE — Progress Notes (Signed)
Office Visit    Patient Name: Carrie Kane Date of Encounter: 06/03/2017  Primary Care Provider:  Cletis Athens, MD Primary Cardiologist:  Johnny Bridge, MD   Chief Complaint    76 year old female status post recent coronary artery bypass grafting with other history including hypertension, hyperlipidemia, insulin-dependent diabetes mellitus, and diastolic dysfunction, who presents for post hospital follow-up.  Past Medical History    Past Medical History:  Diagnosis Date  . CAD (coronary artery disease)    a. 04/2017 Cath: LM 80, LAD 80p, 88m, LCX 95ost, 23m, EF 45-50%; b. 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  . Diastolic dysfunction    a. 04/2017 Echo: EF 55-60%, no rwma, Gr1 DD, mildly dil LA.  Marland Kitchen Hyperlipidemia   . Hypertensive heart disease   . Insulin dependent diabetes mellitus ([place])    Past Surgical History:  Procedure Laterality Date  . ABDOMINAL HYSTERECTOMY    . CORONARY ARTERY BYPASS GRAFT N/A 05/09/2017   Procedure: CORONARY ARTERY BYPASS GRAFTING (CABG) x 3 using left internal mammary artery and right greater saphenous vein harvested endoscopically;  Surgeon: Ivin Poot, MD;  Location: [place];  Service: Open Heart Surgery;  Laterality: N/A;  . INTRAOPERATIVE TRANSESOPHAGEAL ECHOCARDIOGRAM N/A 05/09/2017   Procedure: INTRAOPERATIVE TRANSESOPHAGEAL ECHOCARDIOGRAM;  Surgeon: Ivin Poot, MD;  Location: [facility];  Service: Open Heart Surgery;  Laterality: N/A;  . LEFT HEART CATH AND CORONARY ANGIOGRAPHY N/A 05/02/2017   Procedure: Left Heart Cath and Coronary Angiography;  Surgeon: Wellington Hampshire, MD;  Location: [facility];  Service: Cardiovascular;  Laterality: N/A;    Allergies  Allergies  Allergen Reactions  . Flexeril [Cyclobenzaprine] Hypertension    History of Present Illness    76 year old female with the above past medical history including hypertension, hyperlipidemia, diabetes, and diastolic dysfunction.  She was recently seen  in clinic with complaints of intermittent chest discomfort and an abnl ETT. She subsequently underwent diagnostic catheterization in July, which revealed significant multivessel disease. She was transferred and admitted to [facility] for CT surgical evaluation. She subsequently underwent coronary artery bypass grafting 3. Postoperative course was uncomplicated and she was discharged on July 31. Since discharge, she has been doing well. She denies angina, dyspnea, PND, orthopnea, palpitations, dizziness, syncope, edema, or early satiety. She feels as though her wounds have been healing well.  She occasionally notes incisional pain in her chest, which is positional and relieved by rolling over in bed.  Home Medications    Prior to Admission medications   Medication Sig Start Date End Date Taking? Authorizing Provider  alum & mag hydroxide-simeth (MAALOX/MYLANTA) 200-200-20 MG/5ML suspension Take 15 mLs by mouth every 6 (six) hours as needed for indigestion or heartburn.   Yes [provider]  aspirin 325 MG EC tablet Take 1 tablet (325 mg total) by mouth daily. 05/15/17  Yes Harriet Pho, Tessa N, PA-C  atorvastatin (LIPITOR) 40 MG tablet Take 1 tablet (40 mg total) by mouth daily at 6 PM. 06/03/17  Yes Rogelia Mire, NP  benazepril-hydrochlorthiazide (LOTENSIN HCT) 20-12.5 MG per tablet Take 1 tablet by mouth 2 (two) times daily.    Yes [provider]  Chlorpheniramine Maleate (ALLERGY PO) Take 1 tablet by mouth daily as needed (allergies).   Yes [provider]  cholecalciferol (VITAMIN D) 1000 units tablet Take 2,000 Units by mouth daily at 12 noon.    Yes [provider]  insulin glargine (LANTUS) 100 UNIT/ML injection Inject 45 Units into the  skin at bedtime.   Yes [provider]  insulin lispro (HUMALOG) 100 UNIT/ML injection Inject 12-14 Units into the skin 2 (two) times daily. 12 units with lunch and 14 units with dinner   Yes [provider]    insulin lispro protamine-lispro (HUMALOG 75/25 MIX) (75-25) 100 UNIT/ML SUSP injection Inject 19-38 Units into the skin daily with breakfast.   Yes [provider]  levothyroxine (SYNTHROID, LEVOTHROID) 100 MCG tablet Take 100 mcg by mouth daily before breakfast.   Yes [provider]  metoprolol tartrate (LOPRESSOR) 25 MG tablet Take 1 tablet (25 mg total) by mouth 2 (two) times daily. 06/03/17  Yes Rogelia Mire, NP  Multiple Vitamin (MULTIVITAMIN) capsule Take 1 capsule by mouth daily at 12 noon.    Yes [provider]  Polyethyl Glycol-Propyl Glycol (SYSTANE OP) Apply 1 drop to eye as needed (dry eyes).   Yes [provider]  cloNIDine (CATAPRES) 0.2 MG tablet Take 1 tablet (0.2 mg total) by mouth 2 (two) times daily. 06/03/17   Rogelia Mire, NP  nitroGLYCERIN (NITROSTAT) 0.4 MG SL tablet Place 1 tablet (0.4 mg total) under the tongue every 5 (five) minutes as needed for chest pain. 06/03/17   Rogelia Mire, NP    Review of Systems    Overall doing well since bypass surgery.  She denies chest pain, palpitations, dyspnea, pnd, orthopnea, n, v, dizziness, syncope, edema, weight gain, or early satiety.  All other systems reviewed and are otherwise negative except as noted above.  Physical Exam    VS:  BP (!) 90/52 (BP Location: Left Arm, Patient Position: Sitting, Cuff Size: Normal)   Pulse 69   Ht 5' 8.5" (1.74 m)   Wt 181 lb 8 oz (82.3 kg)   BMI 27.20 kg/m  , BMI Body mass index is 27.2 kg/m. GEN: Well nourished, well developed, in no acute distress.  HEENT: normal.  Neck: Supple, no JVD, carotid bruits, or masses. Cardiac: RRR, 2/6 systolic ejection murmur at the right upper sternal border, no rubs, or gallops. No clubbing, cyanosis, trace right ankle edema.  Radials/DP/PT 2+ and equal bilaterally. Midline sternal incision is healing well without erythema or drainage. Right medial thigh incision is healing well without erythema or  drainage. Respiratory:  Respirations regular and unlabored, clear to auscultation bilaterally. GI: Soft, nontender, nondistended, BS + x 4.  Chest tube sites in the upper quadrants are healing well without erythema or drainage. MS: no deformity or atrophy. Skin: warm and dry, no rash. Neuro:  Strength and sensation are intact. Psych: Normal affect.  Accessory Clinical Findings    ECG - regular sinus rhythm, 69, left axis deviation, left anterior fascicular block, LVH with repolarization abnormality  Assessment & Plan    1.  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 She subsequently underwent coronary artery bypass grafting times 3. Postoperative course was uncomplicated. She has been feeling well without any dyspnea or recurrent angina. Her surgical incisions have been healing well. She remains on aspirin, statin, beta blocker, and ACE inhibitor therapy. She is planning on enrolling in cardiac rehabilitation here at [facility] once she is cleared by CT surgery.  2. Essential hypertension:Patient's blood pressure is low today at 90/52. She does check it at home and notes that it has been running in the low 100s recently. I have asked her to  reduce her clonidine to 0.2 mg twice a day and sent in a new prescription.  She otherwise remains on beta blocker, ACE inhibitor, and diuretic therapy.  I am going to follow-up a basic metabolic panel today in the setting of hypokalemia on July 27.  3. Hyperlipidemia:  LDL was 81 on July 17. Continue high potency statin therapy.  4. Insulin-dependent diabetes mellitus: She remains on insulin therapy and is followed by primary care.  A1c was 8.4 on July 17.  5. Hypokalemia: This was noted during hospitalization. Follow-uasic metabolic panel today.  Murray Hodgkins, NP 06/03/2017,  1:57 PM

## 2017-06-03 NOTE — Patient Instructions (Signed)
Medication Instructions:  Your physician has recommended you make the following change in your medication:  DECREASE clondine to 0.2mg  twice daily   Labwork: BMET today  Testing/Procedures: none  Follow-Up: Your physician recommends that you schedule a follow-up appointment in: 2 months with Dr. Rockey Situ.    Any Other Special Instructions Will Be Listed Below (If Applicable).     If you need a refill on your cardiac medications before your next appointment, please call your pharmacy.

## 2017-06-04 LAB — BASIC METABOLIC PANEL
BUN / CREAT RATIO: 20 (ref 12–28)
BUN: 21 mg/dL (ref 8–27)
CO2: 23 mmol/L (ref 20–29)
CREATININE: 1.07 mg/dL — AB (ref 0.57–1.00)
Calcium: 9.5 mg/dL (ref 8.7–10.3)
Chloride: 98 mmol/L (ref 96–106)
GFR, EST AFRICAN AMERICAN: 59 mL/min/{1.73_m2} — AB (ref >59)
GFR, EST NON AFRICAN AMERICAN: 51 mL/min/{1.73_m2} — AB (ref >59)
Glucose: 239 mg/dL — ABNORMAL HIGH (ref 65–99)
POTASSIUM: 4.3 mmol/L (ref 3.5–5.2)
SODIUM: 138 mmol/L (ref 134–144)

## 2017-06-06 ENCOUNTER — Other Ambulatory Visit: Payer: Self-pay

## 2017-06-06 MED ORDER — CLONIDINE HCL 0.2 MG PO TABS: 0.2000 mg | ORAL_TABLET | Freq: Two times a day (BID) | ORAL | 0 refills | Status: DC

## 2017-06-09 ENCOUNTER — Encounter: Payer: Medicare Other | Attending: Cardiovascular Disease | Admitting: *Deleted

## 2017-06-09 ENCOUNTER — Encounter: Payer: Self-pay | Admitting: *Deleted

## 2017-06-09 VITALS — Ht 69.0 in | Wt 182.4 lb

## 2017-06-09 DIAGNOSIS — Z951 Presence of aortocoronary bypass graft: Secondary | ICD-10-CM | POA: Diagnosis not present

## 2017-06-09 NOTE — Patient Instructions (Signed)
Patient Instructions  Patient Details  Name: Carrie Kane MRN: [id number] Date of Birth: 08/11/1941 Referring Provider:  Wellington Hampshire, MD  Below are the personal goals you chose as well as exercise and nutrition goals. Our goal is to help you keep on track towards obtaining and maintaining your goals. We will be discussing your progress on these goals with you throughout the program.  Initial Exercise Prescription:     Initial Exercise Prescription - 06/09/17 1500      Date of Initial Exercise RX and Referring Provider   Date 06/09/17   Referring Provider Arida     Treadmill   MPH 1.5   Grade 0   Minutes 15   METs 2     NuStep   Level 2   SPM 60   Minutes 15   METs 2     Biostep-RELP   Level 2   SPM 50   Minutes 15   METs 2     Track   Laps 25   Minutes 15   METs 2     Prescription Details   Frequency (times per week) 3   Duration Progress to 30 minutes of continuous aerobic without signs/symptoms of physical distress     Intensity   THRR 40-80% of Max Heartrate 99-130   Ratings of Perceived Exertion 11-13   Perceived Dyspnea 0-4     Resistance Training   Training Prescription Yes   Weight 2   Reps 10-15      Exercise Goals: Frequency: Be able to perform aerobic exercise three times per week working toward 3-5 days per week.  Intensity: Work with a perceived exertion of 11 (fairly light) - 15 (hard) as tolerated. Follow your new exercise prescription and watch for changes in prescription as you progress with the program. Changes will be reviewed with you when they are made.  Duration: You should be able to do 30 minutes of continuous aerobic exercise in addition to a 5 minute warm-up and a 5 minute cool-down routine.  Nutrition Goals: Your personal nutrition goals will be established when you do your nutrition analysis with the dietician.  The following are nutrition guidelines to follow: Cholesterol < 200mg /day Sodium < 1500mg /day Fiber:  Women over 50 yrs - 21 grams per day  Personal Goals:     Personal Goals and Risk Factors at Admission - 06/09/17 1421      Core Components/Risk Factors/Patient Goals on Admission    Weight Management Weight Gain;Yes   Intervention Weight Management: Provide education and appropriate resources to help participant work on and attain dietary goals.;Weight Management: Develop a combined nutrition and exercise program designed to reach desired caloric intake, while maintaining appropriate intake of nutrient and fiber, sodium and fats, and appropriate energy expenditure required for the weight goal.  Patient states she would like to gain weight in her extremeties because they are "looking too thin"   Admit Weight 182 lb 6.4 oz (82.7 kg)   Goal Weight: Short Term 184 lb (83.5 kg)   Goal Weight: Long Term 186 lb (84.4 kg)  patient unsure, just wants to not look too thin in arms   Expected Outcomes Short Term: Continue to assess and modify interventions until short term weight is achieved;Long Term: Adherence to nutrition and physical activity/exercise program aimed toward attainment of established weight goal;Weight Maintenance: Understanding of the daily nutrition guidelines, which includes 25-35% calories from fat, 7% or less cal from saturated fats, less than 200mg  cholesterol, less than 1.5gm  of sodium, & 5 or more servings of fruits and vegetables daily;Understanding recommendations for meals to include 15-35% energy as protein, 25-35% energy from fat, 35-60% energy from carbohydrates, less than 200mg  of dietary cholesterol, 20-35 gm of total fiber daily;Understanding of distribution of calorie intake throughout the day with the consumption of 4-5 meals/snacks   Diabetes Yes   Intervention Provide education about signs/symptoms and action to take for hypo/hyperglycemia.;Provide education about proper nutrition, including hydration, and aerobic/resistive exercise prescription along with prescribed  medications to achieve blood glucose in normal ranges: Fasting glucose 65-99 mg/dL   Expected Outcomes Short Term: Participant verbalizes understanding of the signs/symptoms and immediate care of hyper/hypoglycemia, proper foot care and importance of medication, aerobic/resistive exercise and nutrition plan for blood glucose control.;Long Term: Attainment of HbA1C < 7%.   Heart Failure Yes   Intervention Provide a combined exercise and nutrition program that is supplemented with education, support and counseling about heart failure. Directed toward relieving symptoms such as shortness of breath, decreased exercise tolerance, and extremity edema.   Expected Outcomes Improve functional capacity of life;Short term: Attendance in program 2-3 days a week with increased exercise capacity. Reported lower sodium intake. Reported increased fruit and vegetable intake. Reports medication compliance.;Short term: Daily weights obtained and reported for increase. Utilizing diuretic protocols set by physician.;Long term: Adoption of self-care skills and reduction of barriers for early signs and symptoms recognition and intervention leading to self-care maintenance.      Tobacco Use Initial Evaluation: History  Smoking Status  . Never Smoker  Smokeless Tobacco  . Never Used    Copy of goals given to participant.

## 2017-06-09 NOTE — Progress Notes (Signed)
Daily Session Note  Patient Details  Name: Carrie Kane MRN: [id number] Date of Birth: Feb 23, 1941 Referring Provider:     Cardiac Rehab from 06/09/2017 in [facility]  Referring Provider  Arida      Encounter Date: 06/09/2017  Check In:     Session Check In - 06/09/17 1415      Check-In   Location [facility]   Staff Present Heath Lark, RN, BSN, CCRP;Jalina Blowers Sherryll Burger, RN Vickki Hearing, BA, ACSM CEP, Exercise Physiologist   Supervising physician immediately available to respond to emergencies See telemetry face sheet for immediately available ER MD   Medication changes reported     No   Fall or balance concerns reported    No   Warm-up and Cool-down Performed as group-led instruction   Resistance Training Performed Yes   VAD Patient? No     Pain Assessment   Currently in Pain? No/denies           Exercise Prescription Changes - 06/09/17 1400      Response to Exercise   Blood Pressure (Admit) 130/58   Blood Pressure (Exercise) 132/48   Blood Pressure (Exit) 128/50   Heart Rate (Admit) 70 bpm   Heart Rate (Exercise) 95 bpm   Heart Rate (Exit) 67 bpm   Oxygen Saturation (Admit) 100 %   Oxygen Saturation (Exercise) 100 %   Rating of Perceived Exertion (Exercise) 15      History  Smoking Status  . Never Smoker  Smokeless Tobacco  . Never Used    Goals Met:  Proper associated with RPD/PD & O2 Sat Exercise tolerated well Personal goals reviewed No report of cardiac concerns or symptoms Strength training completed today  Goals Unmet:  Not Applicable  Comments: Med Review Completed   Dr. Emily Filbert is Medical Director for [facility] and [facility].

## 2017-06-09 NOTE — Progress Notes (Signed)
Cardiac Individual Treatment Plan  Patient Details  Name: Carrie Kane Name MRN: [id number] Date of Birth: February 09, 1941 Referring Provider:     Cardiac Rehab from 06/09/2017 in [facility] Cardiac and Pulmonary Rehab  Referring Provider  Arida      Initial Encounter Date:    Cardiac Rehab from 06/09/2017 in [facility] Cardiac and Pulmonary Rehab  Date  06/09/17  Referring Provider  Fletcher Anon      Visit Diagnosis: S/P CABG x 3  Patient's Home Medications on Admission:  Current Outpatient Prescriptions:  .  alum & mag hydroxide-simeth (MAALOX/MYLANTA) 200-200-20 MG/5ML suspension, Take 15 mLs by mouth every 6 (six) hours as needed for indigestion or heartburn., Disp: , Rfl:  .  aspirin 325 MG EC tablet, Take 1 tablet (325 mg total) by mouth daily., Disp: 30 tablet, Rfl: 0 .  atorvastatin (LIPITOR) 40 MG tablet, Take 1 tablet (40 mg total) by mouth daily at 6 PM., Disp: 90 tablet, Rfl: 1 .  benazepril-hydrochlorthiazide (LOTENSIN HCT) 20-12.5 MG per tablet, Take 1 tablet by mouth 2 (two) times daily. , Disp: , Rfl:  .  Chlorpheniramine Maleate (ALLERGY PO), Take 1 tablet by mouth daily as needed (allergies)., Disp: , Rfl:  .  cholecalciferol (VITAMIN D) 1000 units tablet, Take 2,000 Units by mouth daily at 12 noon. , Disp: , Rfl:  .  cloNIDine (CATAPRES) 0.2 MG tablet, Take 1 tablet (0.2 mg total) by mouth 2 (two) times daily., Disp: 90 tablet, Rfl: 1 .  cloNIDine (CATAPRES) 0.2 MG tablet, Take 1 tablet (0.2 mg total) by mouth 2 (two) times daily., Disp: 60 tablet, Rfl: 0 .  insulin glargine (LANTUS) 100 UNIT/ML injection, Inject 45 Units into the skin at bedtime., Disp: , Rfl:  .  insulin lispro (HUMALOG) 100 UNIT/ML injection, Inject 12-14 Units into the skin 2 (two) times daily. 12 units with lunch and 14 units with dinner, Disp: , Rfl:  .  insulin lispro protamine-lispro (HUMALOG 75/25 MIX) (75-25) 100 UNIT/ML SUSP injection, Inject 19-38 Units into the skin daily with breakfast., Disp: , Rfl:  .   levothyroxine (SYNTHROID, LEVOTHROID) 100 MCG tablet, Take 100 mcg by mouth daily before breakfast., Disp: , Rfl:  .  metoprolol tartrate (LOPRESSOR) 25 MG tablet, Take 1 tablet (25 mg total) by mouth 2 (two) times daily., Disp: 180 tablet, Rfl: 1 .  Multiple Vitamin (MULTIVITAMIN) capsule, Take 1 capsule by mouth daily at 12 noon. , Disp: , Rfl:  .  nitroGLYCERIN (NITROSTAT) 0.4 MG SL tablet, Place 1 tablet (0.4 mg total) under the tongue every 5 (five) minutes as needed for chest pain., Disp: 30 tablet, Rfl: 12 .  Polyethyl Glycol-Propyl Glycol (SYSTANE OP), Apply 1 drop to eye as needed (dry eyes)., Disp: , Rfl:   Past Medical History: Past Medical History:  Diagnosis Date  . CAD (coronary artery disease)    a. 04/2017 Cath: LM 80, LAD 80p, 55m, LCX 95ost, 65m, EF 45-50%; b. 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  . Diastolic dysfunction    a. 04/2017 Echo: EF 55-60%, no rwma, Gr1 DD, mildly dil LA.  Marland Kitchen Hyperlipidemia   . Hypertensive heart disease   . Insulin dependent diabetes mellitus (HCC)     Tobacco Use: History  Smoking Status  . Never Smoker  Smokeless Tobacco  . Never Used    Labs: Recent Review Flowsheet Data    Labs for ITP Cardiac and Pulmonary Rehab Latest Ref Rng & Units 05/09/2017 05/09/2017 05/09/2017 05/09/2017 05/10/2017   Cholestrol 0 -  200 mg/dL - - - - -   LDLCALC 0 - 99 mg/dL - - - - -   HDL >40 mg/dL - - - - -   Trlycerides <150 mg/dL - - - - -   Hemoglobin A1c 4.8 - 5.6 % - - - - -   PHART 7.350 - 7.450 7.455(H) 7.355 7.381 - -   PCO2ART 32.0 - 48.0 mmHg 33.3 40.1 41.3 - -   HCO3 20.0 - 28.0 mmol/L 23.7 22.6 24.6 - -   TCO2 0 - 100 mmol/L 25 24 26 24 23    ACIDBASEDEF 0.0 - 2.0 mmol/L - 3.0(H) 1.0 - -   O2SAT % 99.0 97.0 97.0 - -       Exercise Target Goals: Date: 06/09/17  Exercise Program Goal: Individual exercise prescription set with THRR, safety & activity barriers. Participant demonstrates ability to understand and report RPE using BORG  scale, to self-measure pulse accurately, and to acknowledge the importance of the exercise prescription.  Exercise Prescription Goal: Starting with aerobic activity 30 plus minutes a day, 3 days per week for initial exercise prescription. Provide home exercise prescription and guidelines that participant acknowledges understanding prior to discharge.  Activity Barriers & Risk Stratification:     Activity Barriers & Cardiac Risk Stratification - 06/09/17 1429      Activity Barriers & Cardiac Risk Stratification   Activity Barriers Back Problems   Cardiac Risk Stratification High      6 Minute Walk:     6 Minute Walk    Row Name 06/09/17 1506         6 Minute Walk   Phase Initial     Distance 900 feet     Walk Time 6 minutes     # of Rest Breaks 0     MPH 1.7     METS 2     RPE 15     VO2 Peak 7.03     Symptoms No     Resting HR 68 bpm     Resting BP 130/58     Max Ex. HR 95 bpm     Max Ex. BP 132/48     2 Minute Post BP 128/50        Oxygen Initial Assessment:   Oxygen Re-Evaluation:   Oxygen Discharge (Final Oxygen Re-Evaluation):   Initial Exercise Prescription:     Initial Exercise Prescription - 06/09/17 1500      Date of Initial Exercise RX and Referring Provider   Date 06/09/17   Referring Provider Arida     Treadmill   MPH 1.5   Grade 0   Minutes 15   METs 2     NuStep   Level 2   SPM 60   Minutes 15   METs 2     Biostep-RELP   Level 2   SPM 50   Minutes 15   METs 2     Track   Laps 25   Minutes 15   METs 2     Prescription Details   Frequency (times per week) 3   Duration Progress to 30 minutes of continuous aerobic without signs/symptoms of physical distress     Intensity   THRR 40-80% of Max Heartrate 99-130   Ratings of Perceived Exertion 11-13   Perceived Dyspnea 0-4     Resistance Training   Training Prescription Yes   Weight 2   Reps 10-15      Perform Capillary Blood Glucose checks as  needed.  Exercise  Prescription Changes:     Exercise Prescription Changes    Row Name 06/09/17 1400             Response to Exercise   Blood Pressure (Admit) 130/58       Blood Pressure (Exercise) 132/48       Blood Pressure (Exit) 128/50       Heart Rate (Admit) 70 bpm       Heart Rate (Exercise) 95 bpm       Heart Rate (Exit) 67 bpm       Oxygen Saturation (Admit) 100 %       Oxygen Saturation (Exercise) 100 %       Rating of Perceived Exertion (Exercise) 15          Exercise Comments:   Exercise Goals and Review:     Exercise Goals    Row Name 06/09/17 1506             Exercise Goals   Increase Physical Activity Yes       Intervention Provide advice, education, support and counseling about physical activity/exercise needs.;Develop an individualized exercise prescription for aerobic and resistive training based on initial evaluation findings, risk stratification, comorbidities and participant's personal goals.       Expected Outcomes Achievement of increased cardiorespiratory fitness and enhanced flexibility, muscular endurance and strength shown through measurements of functional capacity and personal statement of participant.       Increase Strength and Stamina Yes       Intervention Provide advice, education, support and counseling about physical activity/exercise needs.;Develop an individualized exercise prescription for aerobic and resistive training based on initial evaluation findings, risk stratification, comorbidities and participant's personal goals.       Expected Outcomes Achievement of increased cardiorespiratory fitness and enhanced flexibility, muscular endurance and strength shown through measurements of functional capacity and personal statement of participant.          Exercise Goals Re-Evaluation :   Discharge Exercise Prescription (Final Exercise Prescription Changes):     Exercise Prescription Changes - 06/09/17 1400      Response to Exercise   Blood  Pressure (Admit) 130/58   Blood Pressure (Exercise) 132/48   Blood Pressure (Exit) 128/50   Heart Rate (Admit) 70 bpm   Heart Rate (Exercise) 95 bpm   Heart Rate (Exit) 67 bpm   Oxygen Saturation (Admit) 100 %   Oxygen Saturation (Exercise) 100 %   Rating of Perceived Exertion (Exercise) 15      Nutrition:  Target Goals: Understanding of nutrition guidelines, daily intake of sodium 1500mg , cholesterol 200mg , calories 30% from fat and 7% or less from saturated fats, daily to have 5 or more servings of fruits and vegetables.  Biometrics:     Pre Biometrics - 06/09/17 1505      Pre Biometrics   Height 5\' 9"  (1.753 m)   Weight 182 lb 6.4 oz (82.7 kg)   Waist Circumference 41.25 inches   Hip Circumference 38.5 inches   Waist to Hip Ratio 1.07 %   BMI (Calculated) 26.92       Nutrition Therapy Plan and Nutrition Goals:   Nutrition Discharge: Rate Your Plate Scores:   Nutrition Goals Re-Evaluation:   Nutrition Goals Discharge (Final Nutrition Goals Re-Evaluation):   Psychosocial: Target Goals: Acknowledge presence or absence of significant depression and/or stress, maximize coping skills, provide positive support system. Participant is able to verbalize types and ability to use techniques and skills needed for  reducing stress and depression.   Initial Review & Psychosocial Screening:   Quality of Life Scores:      Quality of Life - 06/09/17 1426      Quality of Life Scores   Health/Function Pre 20.43 %   Socioeconomic Pre 21 %   Psych/Spiritual Pre 20.57 %   Family Pre 20.5 %   GLOBAL Pre 20.58 %      PHQ-9: Recent Review Flowsheet Data    Depression screen [facility] 2/9 06/09/2017   Decreased Interest 0   Down, Depressed, Hopeless 0   PHQ - 2 Score 0   Altered sleeping 1   Tired, decreased energy 0   Change in appetite 0   Feeling bad or failure about yourself  0   Trouble concentrating 0   Moving slowly or fidgety/restless 0   Suicidal thoughts 0    PHQ-9 Score 1   Difficult doing work/chores Not difficult at all     Interpretation of Total Score  Total Score Depression Severity:  1-4 = Minimal depression, 5-9 = Mild depression, 10-14 = Moderate depression, 15-19 = Moderately severe depression, 20-27 = Severe depression   Psychosocial Evaluation and Intervention:   Psychosocial Re-Evaluation:   Psychosocial Discharge (Final Psychosocial Re-Evaluation):   Vocational Rehabilitation: Provide vocational rehab assistance to qualifying candidates.   Vocational Rehab Evaluation & Intervention:     Vocational Rehab - 06/09/17 1427      Initial Vocational Rehab Evaluation & Intervention   Assessment shows need for Vocational Rehabilitation No      Education: Education Goals: Education classes will be provided on a weekly basis, covering required topics. Participant will state understanding/return demonstration of topics presented.  Learning Barriers/Preferences:     Learning Barriers/Preferences - 06/09/17 1427      Learning Barriers/Preferences   Learning Barriers None   Learning Preferences Individual Instruction      Education Topics: General Nutrition Guidelines/Fats and Fiber: -Group instruction provided by verbal, written material, models and posters to present the general guidelines for heart healthy nutrition. Gives an explanation and review of dietary fats and fiber.   Controlling Sodium/Reading Food Labels: -Group verbal and written material supporting the discussion of sodium use in heart healthy nutrition. Review and explanation with models, verbal and written materials for utilization of the food label.   Exercise Physiology & Risk Factors: - Group verbal and written instruction with models to review the exercise physiology of the cardiovascular system and associated critical values. Details cardiovascular disease risk factors and the goals associated with each risk factor.   Aerobic Exercise &  Resistance Training: - Gives group verbal and written discussion on the health impact of inactivity. On the components of aerobic and resistive training programs and the benefits of this training and how to safely progress through these programs.   Flexibility, Balance, General Exercise Guidelines: - Provides group verbal and written instruction on the benefits of flexibility and balance training programs. Provides general exercise guidelines with specific guidelines to those with heart or lung disease. Demonstration and skill practice provided.   Stress Management: - Provides group verbal and written instruction about the health risks of elevated stress, cause of high stress, and healthy ways to reduce stress.   Depression: - Provides group verbal and written instruction on the correlation between heart/lung disease and depressed mood, treatment options, and the stigmas associated with seeking treatment.   Anatomy & Physiology of the Heart: - Group verbal and written instruction and models provide basic cardiac anatomy and  physiology, with the coronary electrical and arterial systems. Review of: AMI, Angina, Valve disease, Heart Failure, Cardiac Arrhythmia, Pacemakers, and the ICD.   Cardiac Procedures: - Group verbal and written instruction and models to describe the testing methods done to diagnose heart disease. Reviews the outcomes of the test results. Describes the treatment choices: Medical Management, Angioplasty, or Coronary Bypass Surgery.   Cardiac Medications: - Group verbal and written instruction to review commonly prescribed medications for heart disease. Reviews the medication, class of the drug, and side effects. Includes the steps to properly store meds and maintain the prescription regimen.   Go Sex-Intimacy & Heart Disease, Get SMART - Goal Setting: - Group verbal and written instruction through game format to discuss heart disease and the return to sexual intimacy.  Provides group verbal and written material to discuss and apply goal setting through the application of the S.M.A.R.T. Method.   Other Matters of the Heart: - Provides group verbal, written materials and models to describe Heart Failure, Angina, Valve Disease, and Diabetes in the realm of heart disease. Includes description of the disease process and treatment options available to the cardiac patient.   Exercise & Equipment Safety: - Individual verbal instruction and demonstration of equipment use and safety with use of the equipment.   Cardiac Rehab from 06/09/2017 in [facility] Cardiac and Pulmonary Rehab  Date  06/09/17  Educator  [facility]  Instruction Review Code  1- partially meets, needs review/practice      Infection Prevention: - Provides verbal and written material to individual with discussion of infection control including proper hand washing and proper equipment cleaning during exercise session.   Cardiac Rehab from 06/09/2017 in [facility] Cardiac and Pulmonary Rehab  Date  06/09/17  Educator  [facility]  Instruction Review Code  2- meets goals/outcomes      Falls Prevention: - Provides verbal and written material to individual with discussion of falls prevention and safety.   Cardiac Rehab from 06/09/2017 in [facility] Cardiac and Pulmonary Rehab  Date  06/09/17  Educator  [facility]  Instruction Review Code  2- meets goals/outcomes      Diabetes: - Individual verbal and written instruction to review signs/symptoms of diabetes, desired ranges of glucose level fasting, after meals and with exercise. Advice that pre and post exercise glucose checks will be done for 3 sessions at entry of program.   Cardiac Rehab from 06/09/2017 in [facility] Cardiac and Pulmonary Rehab  Date  06/09/17  Educator  [facility]  Instruction Review Code  2- meets goals/outcomes       Knowledge Questionnaire Score:     Knowledge Questionnaire Score - 06/09/17 1427      Knowledge Questionnaire Score   Pre Score 17/28   Correct answers  reviewed with Inez Catalina.      Core Components/Risk Factors/Patient Goals at Admission:     Personal Goals and Risk Factors at Admission - 06/09/17 1421      Core Components/Risk Factors/Patient Goals on Admission    Weight Management Weight Gain;Yes   Intervention Weight Management: Provide education and appropriate resources to help participant work on and attain dietary goals.;Weight Management: Develop a combined nutrition and exercise program designed to reach desired caloric intake, while maintaining appropriate intake of nutrient and fiber, sodium and fats, and appropriate energy expenditure required for the weight goal.  Patient states she would like to gain weight in her extremeties because they are "looking too thin"   Admit Weight 182 lb 6.4 oz (82.7 kg)   Goal Weight:  Short Term 184 lb (83.5 kg)   Goal Weight: Long Term 186 lb (84.4 kg)  patient unsure, just wants to not look too thin in arms   Expected Outcomes Short Term: Continue to assess and modify interventions until short term weight is achieved;Long Term: Adherence to nutrition and physical activity/exercise program aimed toward attainment of established weight goal;Weight Maintenance: Understanding of the daily nutrition guidelines, which includes 25-35% calories from fat, 7% or less cal from saturated fats, less than 200mg  cholesterol, less than 1.5gm of sodium, & 5 or more servings of fruits and vegetables daily;Understanding recommendations for meals to include 15-35% energy as protein, 25-35% energy from fat, 35-60% energy from carbohydrates, less than 200mg  of dietary cholesterol, 20-35 gm of total fiber daily;Understanding of distribution of calorie intake throughout the day with the consumption of 4-5 meals/snacks   Diabetes Yes   Intervention Provide education about signs/symptoms and action to take for hypo/hyperglycemia.;Provide education about proper nutrition, including hydration, and aerobic/resistive exercise  prescription along with prescribed medications to achieve blood glucose in normal ranges: Fasting glucose 65-99 mg/dL   Expected Outcomes Short Term: Participant verbalizes understanding of the signs/symptoms and immediate care of hyper/hypoglycemia, proper foot care and importance of medication, aerobic/resistive exercise and nutrition plan for blood glucose control.;Long Term: Attainment of HbA1C < 7%.   Heart Failure Yes   Intervention Provide a combined exercise and nutrition program that is supplemented with education, support and counseling about heart failure. Directed toward relieving symptoms such as shortness of breath, decreased exercise tolerance, and extremity edema.   Expected Outcomes Improve functional capacity of life;Short term: Attendance in program 2-3 days a week with increased exercise capacity. Reported lower sodium intake. Reported increased fruit and vegetable intake. Reports medication compliance.;Short term: Daily weights obtained and reported for increase. Utilizing diuretic protocols set by physician.;Long term: Adoption of self-care skills and reduction of barriers for early signs and symptoms recognition and intervention leading to self-care maintenance.      Core Components/Risk Factors/Patient Goals Review:    Core Components/Risk Factors/Patient Goals at Discharge (Final Review):    ITP Comments:     ITP Comments    Row Name 06/09/17 1416           ITP Comments Med Review Completed. Initial ITP created. Diagnosis can be found in [facility] 05/13/17          Comments: Initial ITP

## 2017-06-10 ENCOUNTER — Other Ambulatory Visit: Payer: Self-pay

## 2017-06-10 ENCOUNTER — Telehealth: Payer: Self-pay | Admitting: Cardiovascular Disease

## 2017-06-10 MED ORDER — METOPROLOL TARTRATE 25 MG PO TABS: 25.0000 mg | ORAL_TABLET | Freq: Two times a day (BID) | ORAL | 0 refills | Status: DC

## 2017-06-10 NOTE — Telephone Encounter (Signed)
°*  STAT* If patient is at the pharmacy, call can be transferred to refill team.   1. Which medications need to be refilled? (please list name of each medication and dose if known) metoprolol   2. Which pharmacy/location (including street and city if local pharmacy) is [facility]   3. Do they need a 30 day or 90 day supply? 90 day

## 2017-06-10 NOTE — Telephone Encounter (Signed)
Requested Prescriptions   Signed Prescriptions Disp Refills  . metoprolol tartrate (LOPRESSOR) 25 MG tablet 180 tablet 0    Sig: Take 1 tablet (25 mg total) by mouth 2 (two) times daily.    Authorizing Provider: GOLLAN, TIMOTHY J    Ordering User: Naomi Castrogiovanni N    

## 2017-06-14 ENCOUNTER — Encounter: Payer: Self-pay | Admitting: Cardiovascular Disease

## 2017-06-14 ENCOUNTER — Other Ambulatory Visit: Payer: Self-pay | Admitting: Cardiothoracic Surgery

## 2017-06-14 DIAGNOSIS — Z951 Presence of aortocoronary bypass graft: Secondary | ICD-10-CM

## 2017-06-15 ENCOUNTER — Ambulatory Visit
Admission: RE | Admit: 2017-06-15 | Discharge: 2017-06-15 | Disposition: A | Discharge status: Home / Self Care | Payer: Medicare Other | Source: Ambulatory Visit | Attending: Cardiothoracic Surgery | Admitting: Cardiothoracic Surgery

## 2017-06-15 ENCOUNTER — Ambulatory Visit (INDEPENDENT_AMBULATORY_CARE_PROVIDER_SITE_OTHER): Payer: Self-pay | Admitting: Cardiothoracic Surgery

## 2017-06-15 VITALS — BP 117/69 | HR 82 | Resp 20 | Ht 69.0 in | Wt 184.0 lb

## 2017-06-15 DIAGNOSIS — Z951 Presence of aortocoronary bypass graft: Secondary | ICD-10-CM

## 2017-06-15 IMAGING — CR DG CHEST 2V
2 series · 2 of 2 positions shown · non-contrast
Comparison: Radiographs [DATE].

CLINICAL DATA: Status post coronary artery bypass graft.

EXAM:
CHEST  2 VIEW

[w chest pa]
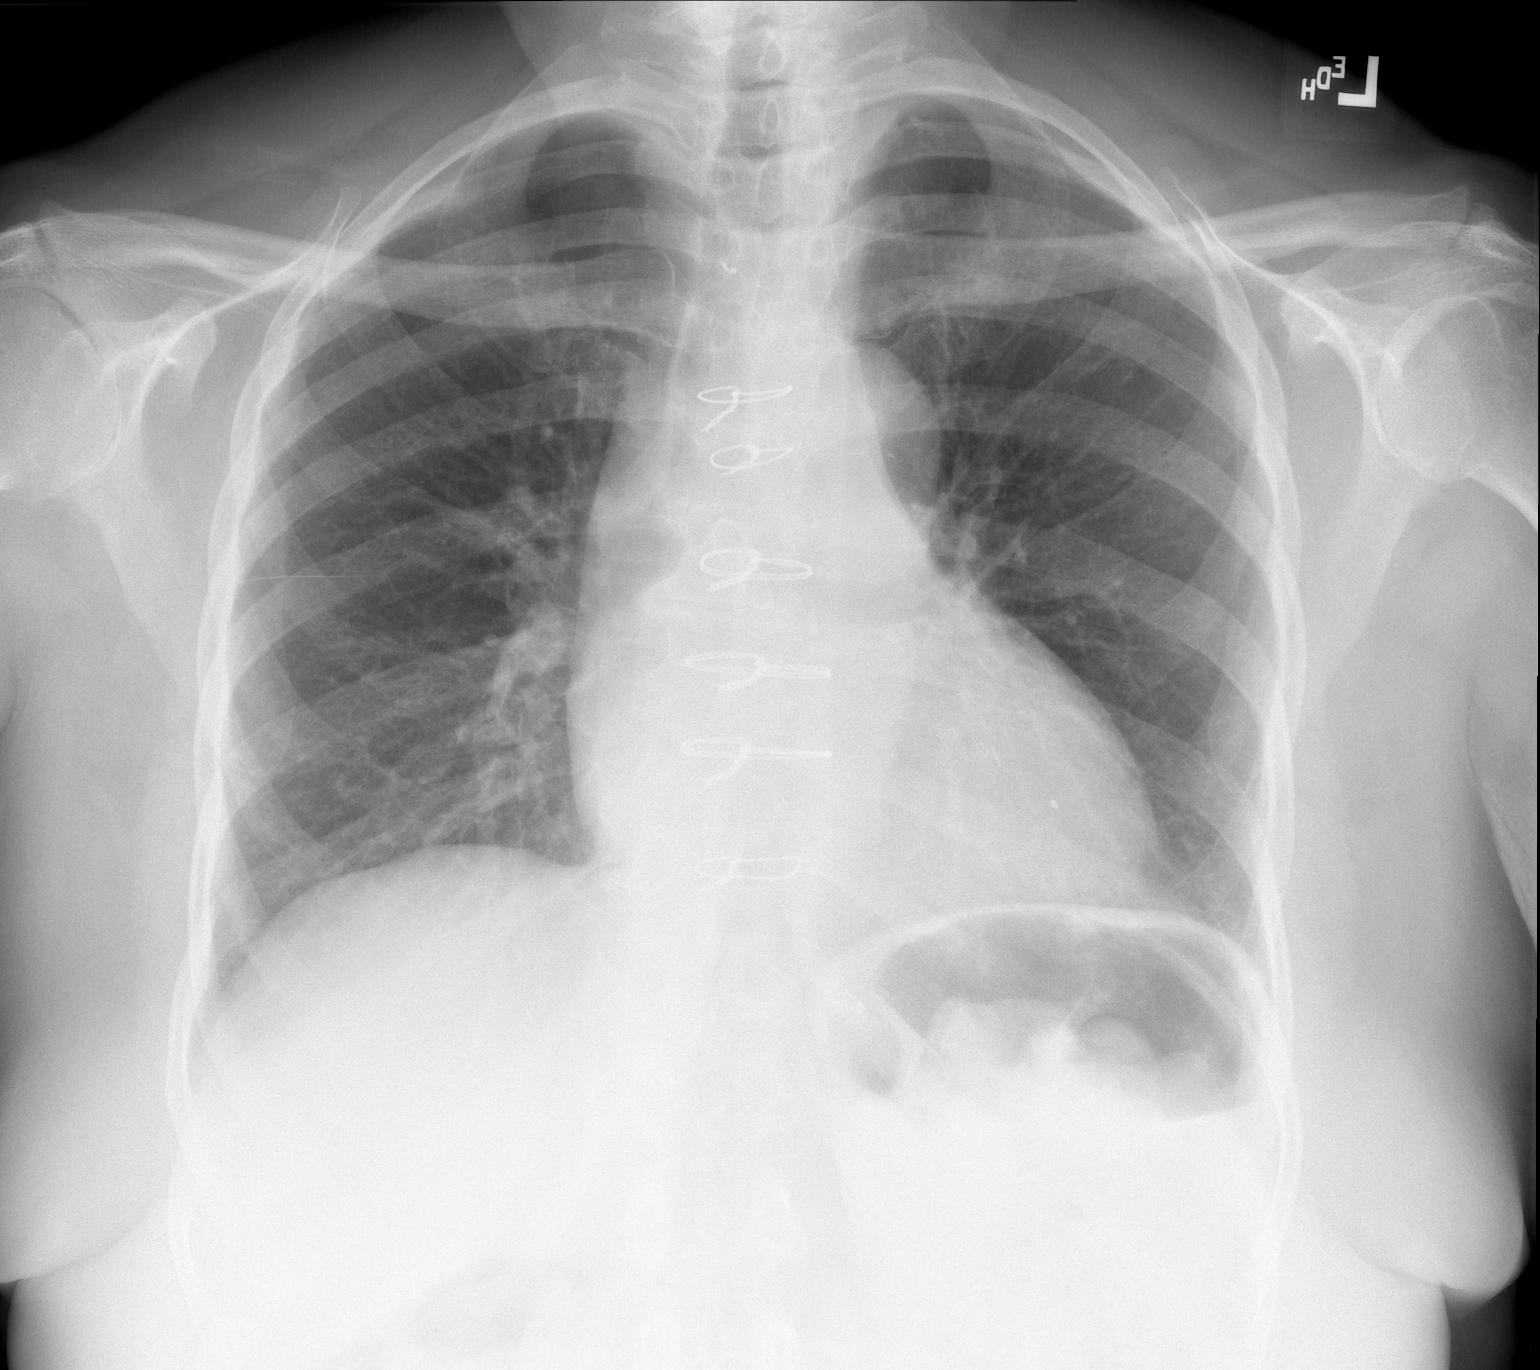

[w chest lat]
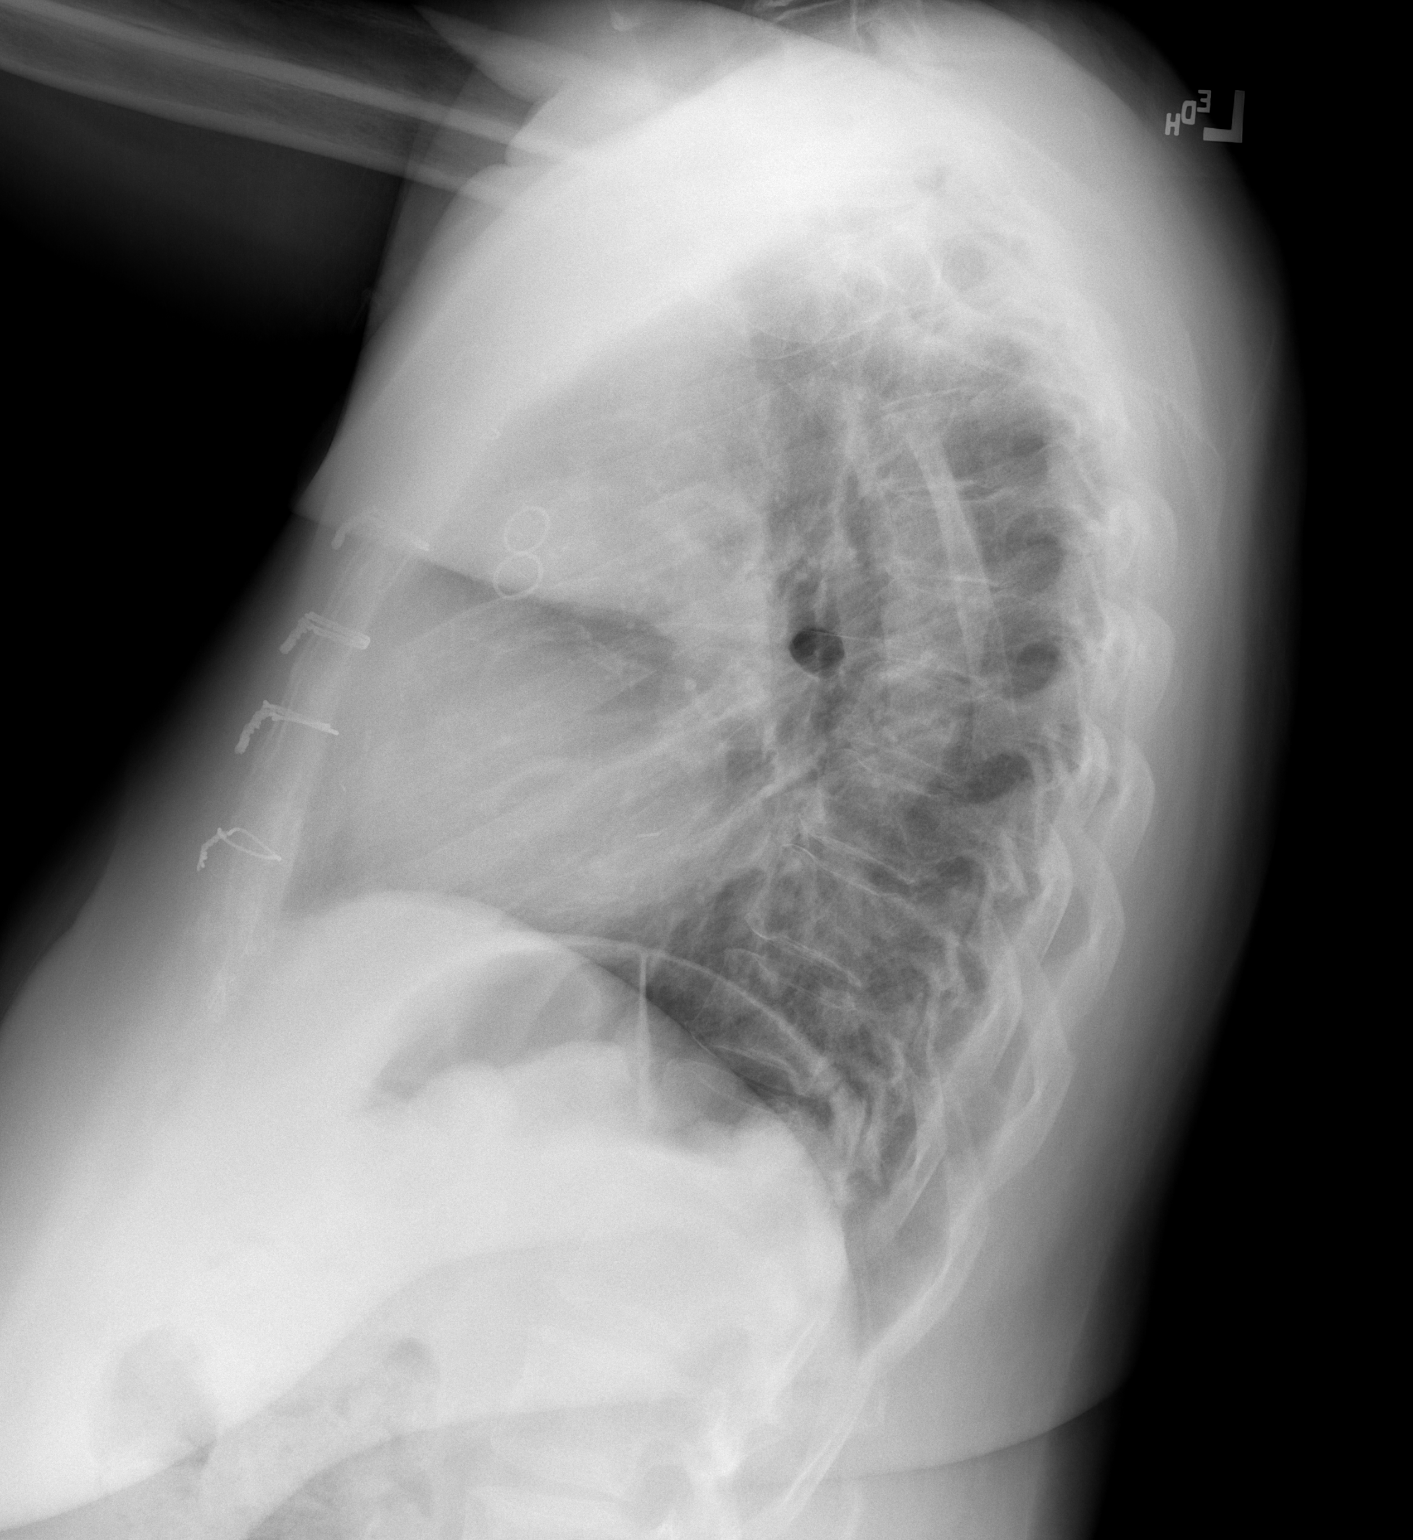

[2 of 2 positions shown; findings below may reference images not displayed]

FINDINGS: Status post coronary artery bypass graft. Stable cardiomediastinal
silhouette. No pneumothorax or pleural effusion is noted. No acute
pulmonary disease is noted. Bony thorax is unremarkable.
IMPRESSION: No active cardiopulmonary disease.

## 2017-06-15 NOTE — Progress Notes (Signed)
PCP is Cletis Athens, MD Referring Provider is Minna Merritts, MD  Chief Complaint  Patient presents with  . Routine Post Op    f/u from surgery with CXR s/p CABG x3, 05/09/17    HPI: The patient returns for scheduled follow-up 1 month after CABG 3 after presentation for symptoms of angina and catheterization findings of severe left main stenosis. She did well after surgery and maintained sinus rhythm. She continues to do well at home without recurrent angina. Surgical incisions are well-healed. She has started outpatient cardiac rehabilitation. She has not required any narcotic pain medication. She denies shortness of breath or edema.  Past Medical History:  Diagnosis Date  . CAD (coronary artery disease)    a. 04/2017 Cath: LM 80, LAD 80p, 33m, LCX 95ost, 73m, EF 45-50%; b. 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  . Diastolic dysfunction    a. 04/2017 Echo: EF 55-60%, no rwma, Gr1 DD, mildly dil LA.  Marland Kitchen Hyperlipidemia   . Hypertensive heart disease   . Insulin dependent diabetes mellitus ([place])     Past Surgical History:  Procedure Laterality Date  . ABDOMINAL HYSTERECTOMY    . CORONARY ARTERY BYPASS GRAFT N/A 05/09/2017   Procedure: CORONARY ARTERY BYPASS GRAFTING (CABG) x 3 using left internal mammary artery and right greater saphenous vein harvested endoscopically;  Surgeon: Ivin Poot, MD;  Location: [facility];  Service: Open Heart Surgery;  Laterality: N/A;  . INTRAOPERATIVE TRANSESOPHAGEAL ECHOCARDIOGRAM N/A 05/09/2017   Procedure: INTRAOPERATIVE TRANSESOPHAGEAL ECHOCARDIOGRAM;  Surgeon: Ivin Poot, MD;  Location: [facility];  Service: Open Heart Surgery;  Laterality: N/A;  . LEFT HEART CATH AND CORONARY ANGIOGRAPHY N/A 05/02/2017   Procedure: Left Heart Cath and Coronary Angiography;  Surgeon: Wellington Hampshire, MD;  Location: [facility];  Service: Cardiovascular;  Laterality: N/A;    Family History  Problem Relation Age of Onset  . Breast cancer Maternal  Aunt     Social History Social History  Substance Use Topics  . Smoking status: Never Smoker  . Smokeless tobacco: Never Used  . Alcohol use No    Current Outpatient Prescriptions  Medication Sig Dispense Refill  . alum & mag hydroxide-simeth (MAALOX/MYLANTA) 200-200-20 MG/5ML suspension Take 15 mLs by mouth every 6 (six) hours as needed for indigestion or heartburn.    Marland Kitchen aspirin 325 MG EC tablet Take 1 tablet (325 mg total) by mouth daily. 30 tablet 0  . atorvastatin (LIPITOR) 40 MG tablet Take 1 tablet (40 mg total) by mouth daily at 6 PM. 90 tablet 1  . benazepril-hydrochlorthiazide (LOTENSIN HCT) 20-12.5 MG per tablet Take 1 tablet by mouth 2 (two) times daily.     . Chlorpheniramine Maleate (ALLERGY PO) Take 1 tablet by mouth daily as needed (allergies).    . cholecalciferol (VITAMIN D) 1000 units tablet Take 2,000 Units by mouth daily at 12 noon.     . cloNIDine (CATAPRES) 0.2 MG tablet Take 1 tablet (0.2 mg total) by mouth 2 (two) times daily. 90 tablet 1  . cloNIDine (CATAPRES) 0.2 MG tablet Take 1 tablet (0.2 mg total) by mouth 2 (two) times daily. 60 tablet 0  . insulin glargine (LANTUS) 100 UNIT/ML injection Inject 45 Units into the skin at bedtime.    . insulin lispro (HUMALOG) 100 UNIT/ML injection Inject 12-14 Units into the skin 2 (two) times daily. 12 units with lunch and 14 units with dinner    . insulin lispro protamine-lispro (HUMALOG 75/25 MIX) (75-25) 100  UNIT/ML SUSP injection Inject 19-38 Units into the skin daily with breakfast.    . levothyroxine (SYNTHROID, LEVOTHROID) 100 MCG tablet Take 100 mcg by mouth daily before breakfast.    . metoprolol tartrate (LOPRESSOR) 25 MG tablet Take 1 tablet (25 mg total) by mouth 2 (two) times daily. 180 tablet 0  . Multiple Vitamin (MULTIVITAMIN) capsule Take 1 capsule by mouth daily at 12 noon.     . nitroGLYCERIN (NITROSTAT) 0.4 MG SL tablet Place 1 tablet (0.4 mg total) under the tongue every 5 (five) minutes as needed for  chest pain. 30 tablet 12  . Polyethyl Glycol-Propyl Glycol (SYSTANE OP) Apply 1 drop to eye as needed (dry eyes).     No current facility-administered medications for this visit.     Allergies  Allergen Reactions  . Flexeril [Cyclobenzaprine] Hypertension    Review of Systems  Strength and appetite improved No complaints  BP 117/69   Pulse 82   Resp 20   Ht 5\' 9"  (1.753 m)   Wt 184 lb (83.5 kg)   SpO2 99% Comment: RA  BMI 27.17 kg/m  Physical Exam      Exam    General- alert and comfortable   Lungs- clear without rales, wheezes   Cor- regular rate and rhythm, no murmur , gallop   Abdomen- soft, non-tender   Extremities - warm, non-tender, minimal edema   Neuro- oriented, appropriate, no focal weakness   Diagnostic Tests: Chest x-ray clear  Impression: Excellent early recovery one month after CABG 3  Plan: Continue current medications Patient understands she may drive and lift up to 20 pounds maximum She understands that she cannot lift more than 20 pounds until 3 months after surgery. We discussed heart healthy diet and heart healthy lifestyle to optimize long-term graft patency. She will return as needed.  Len Childs, MD [facility] [phone]

## 2017-06-16 DIAGNOSIS — Z951 Presence of aortocoronary bypass graft: Secondary | ICD-10-CM | POA: Diagnosis not present

## 2017-06-16 LAB — GLUCOSE, CAPILLARY
GLUCOSE-CAPILLARY: 284 mg/dL — AB (ref 65–99)
GLUCOSE-CAPILLARY: 263 mg/dL — AB (ref 65–99)

## 2017-06-16 NOTE — Progress Notes (Signed)
Daily Session Note  Patient Details  Name: Classie Weng MRN: [id number] Date of Birth: 1941-04-03 Referring Provider:     Cardiac Rehab from 06/09/2017 in [facility] Cardiac and Pulmonary Rehab  Referring Provider  Arida      Encounter Date: 06/16/2017  Check In:     Session Check In - 06/16/17 1650      Check-In   Location [facility]-Cardiac & Pulmonary Rehab   Staff Present Gerlene Burdock, RN, Moises Blood, BS, ACSM CEP, Exercise Physiologist;Estefani Bateson Flavia Shipper   Supervising physician immediately available to respond to emergencies See telemetry face sheet for immediately available ER MD   Medication changes reported     No   Fall or balance concerns reported    No   Warm-up and Cool-down Performed on first and last piece of equipment   Resistance Training Performed Yes   VAD Patient? No     Pain Assessment   Currently in Pain? No/denies   Multiple Pain Sites No         History  Smoking Status  . Never Smoker  Smokeless Tobacco  . Never Used    Goals Met:  Exercise tolerated well No report of cardiac concerns or symptoms Strength training completed today  Goals Unmet:  Not Applicable  Comments: First full day of exercise!  Patient was oriented to gym and equipment including functions, settings, policies, and procedures.  Patient's individual exercise prescription and treatment plan were reviewed.  All starting workloads were established based on the results of the 6 minute walk test done at initial orientation visit.  The plan for exercise progression was also introduced and progression will be customized based on patient's performance and goals.   Dr. Emily Filbert is Medical Director for [place] and [facility].

## 2017-06-22 ENCOUNTER — Encounter: Payer: Medicare Other | Attending: Cardiovascular Disease | Admitting: *Deleted

## 2017-06-22 ENCOUNTER — Telehealth: Payer: Self-pay

## 2017-06-22 DIAGNOSIS — Z951 Presence of aortocoronary bypass graft: Secondary | ICD-10-CM | POA: Insufficient documentation

## 2017-06-22 LAB — GLUCOSE, CAPILLARY
GLUCOSE-CAPILLARY: 302 mg/dL — AB (ref 65–99)
GLUCOSE-CAPILLARY: 208 mg/dL — AB (ref 65–99)

## 2017-06-22 NOTE — Telephone Encounter (Signed)
Only attended first day exercise - no answer or machine

## 2017-06-22 NOTE — Progress Notes (Signed)
Daily Session Note  Patient Details  Name: Carrie Kane MRN: [id number] Date of Birth: 01-29-1941 Referring Provider:     Cardiac Rehab from 06/09/2017 in [facility] and [facility]monary Rehab  Referring Provider  Arida      Encounter Date: 06/22/2017  Check In:     Session Check In - 06/22/17 1733      Check-In   Location [facility]   Staff Present Gerlene Burdock, RN, Vickki Hearing, BA, ACSM CEP, Exercise Physiologist;Meredith Sherryll Burger, RN BSN   Supervising physician immediately available to respond to emergencies See telemetry face sheet for immediately available ER MD   Medication changes reported     No   Fall or balance concerns reported    No   Tobacco Cessation No Change   Warm-up and Cool-down Performed on first and last piece of equipment   Resistance Training Performed Yes   VAD Patient? No     Pain Assessment   Currently in Pain? No/denies         History  Smoking Status  . Never Smoker  Smokeless Tobacco  . Never Used    Goals Met:  Proper associated with RPD/PD & O2 Sat Exercise tolerated well No report of cardiac concerns or symptoms Strength training completed today  Goals Unmet:  Not Applicable  Comments:     Dr. Emily Filbert is Medical Director for [place] and [facility].

## 2017-06-23 DIAGNOSIS — Z951 Presence of aortocoronary bypass graft: Secondary | ICD-10-CM | POA: Diagnosis not present

## 2017-06-23 LAB — GLUCOSE, CAPILLARY
GLUCOSE-CAPILLARY: 117 mg/dL — AB (ref 65–99)
Glucose-Capillary: 125 mg/dL — ABNORMAL HIGH (ref 65–99)

## 2017-06-23 NOTE — Progress Notes (Signed)
Daily Session Note  Patient Details  Name: Carrie Kane MRN: [id number] Date of Birth: 12-Jan-1941 Referring Provider:     Cardiac Rehab from 06/09/2017 in [facility] Cardiac and Pulmonary Rehab  Referring Provider  Arida      Encounter Date: 06/23/2017  Check In:     Session Check In - 06/23/17 1625      Check-In   Location [facility]-Cardiac & [facility] Re[facility]   Staff Present Gerlene Burdock, RN, Vickki Hearing, BA, ACSM CEP, Exercise Physiologist;Kelly Amedeo Plenty, BS, ACSM CEP, Exercise Physiologist;Jisselle Poth Flavia Shipper           Exercise Prescription Changes - 06/23/17 1100      Response to Exercise   Blood Pressure (Admit) 134/62   Blood Pressure (Exercise) 112/52   Blood Pressure (Exit) 148/80   Heart Rate (Admit) 81 bpm   Heart Rate (Exercise) 101 bpm   Heart Rate (Exit) 70 bpm   Rating of Perceived Exertion (Exercise) 17   Symptoms none   Duration Progress to 45 minutes of aerobic exercise without signs/symptoms of physical distress   Intensity THRR unchanged     Progression   Progression Continue to progress workloads to maintain intensity without signs/symptoms of physical distress.   Average METs 1.85     Resistance Training   Training Prescription Yes   Weight 2   Reps 10-15     Interval Training   Interval Training No     Treadmill   MPH 1.2   Grade 0   Minutes 15   METs 1.9     NuStep   Level 2   Minutes 15   METs 1.8      History  Smoking Status  . Never Smoker  Smokeless Tobacco  . Never Used    Goals Met:  Independence with exercise equipment Exercise tolerated well No report of cardiac concerns or symptoms Strength training completed today  Goals Unmet:  Not Applicable  Comments: Pt able to follow exercise prescription today without complaint.  Will continue to monitor for progression.   Dr. Emily Filbert is Medical Director for [place] and [facility].

## 2017-06-27 DIAGNOSIS — Z951 Presence of aortocoronary bypass graft: Secondary | ICD-10-CM | POA: Diagnosis not present

## 2017-06-27 NOTE — Progress Notes (Signed)
Daily Session Note  Patient Details  Name: Carrie Kane MRN: [id number] Date of Birth: 1941/07/22 Referring Provider:     Cardiac Rehab from 06/09/2017 in [facility] Cardiac and Pulmonary Rehab  Referring Provider  Arida      Encounter Date: 06/27/2017  Check In:     Session Check In - 06/27/17 1739      Check-In   Location [facility]-Cardiac & Pulmonary Rehab   Staff Present Nada Maclachlan, BA, ACSM CEP, Exercise Physiologist;Kelly Amedeo Plenty, BS, ACSM CEP, Exercise Physiologist;Meredith Sherryll Burger, RN BSN   Supervising physician immediately available to respond to emergencies See telemetry face sheet for immediately available ER MD   Medication changes reported     No   Fall or balance concerns reported    No   Warm-up and Cool-down Performed on first and last piece of equipment   Resistance Training Performed Yes   VAD Patient? No     Pain Assessment   Currently in Pain? No/denies         History  Smoking Status  . Never Smoker  Smokeless Tobacco  . Never Used    Goals Met:  Independence with exercise equipment Exercise tolerated well No report of cardiac concerns or symptoms Strength training completed today  Goals Unmet:  Not Applicable  Comments: Pt able to follow exercise prescription today without complaint.  Will continue to monitor for progression.    Dr. Emily Filbert is Medical Director for [place] and [facility].

## 2017-06-29 ENCOUNTER — Telehealth: Payer: Self-pay | Admitting: Cardiovascular Disease

## 2017-06-29 ENCOUNTER — Other Ambulatory Visit: Payer: Self-pay | Admitting: Cardiology

## 2017-06-29 ENCOUNTER — Encounter: Payer: Medicare Other | Admitting: *Deleted

## 2017-06-29 ENCOUNTER — Encounter: Payer: Self-pay | Admitting: *Deleted

## 2017-06-29 DIAGNOSIS — I6523 Occlusion and stenosis of bilateral carotid arteries: Secondary | ICD-10-CM

## 2017-06-29 DIAGNOSIS — Z951 Presence of aortocoronary bypass graft: Secondary | ICD-10-CM | POA: Diagnosis not present

## 2017-06-29 NOTE — Telephone Encounter (Signed)
Patient came to the office for Carotid U/s that was not needed as the appointment was scheduled to soon.    Patient states this is not the first time this has happened . She was told previously that she needed to schedule an appt and she was then told that it wasn't needed.  Patient states if she is called again she will not reschedule this appt.

## 2017-06-29 NOTE — Progress Notes (Signed)
Cardiac Individual Treatment Plan  Patient Details  Name: Carrie Kane MRN: [id number] Date of Birth: March 19, 1941 Referring Provider:     Cardiac Rehab from 06/09/2017 in [facility] Cardiac and Pulmonary Rehab  Referring Provider  Arida      Initial Encounter Date:    Cardiac Rehab from 06/09/2017 in [facility] Cardiac and Pulmonary Rehab  Date  06/09/17  Referring Provider  Fletcher Anon      Visit Diagnosis: S/P CABG x 3  Patient's Home Medications on Admission:  Current Outpatient Prescriptions:  .  alum & mag hydroxide-simeth (MAALOX/MYLANTA) 200-200-20 MG/5ML suspension, Take 15 mLs by mouth every 6 (six) hours as needed for indigestion or heartburn., Disp: , Rfl:  .  aspirin 325 MG EC tablet, Take 1 tablet (325 mg total) by mouth daily., Disp: 30 tablet, Rfl: 0 .  atorvastatin (LIPITOR) 40 MG tablet, Take 1 tablet (40 mg total) by mouth daily at 6 PM., Disp: 90 tablet, Rfl: 1 .  benazepril-hydrochlorthiazide (LOTENSIN HCT) 20-12.5 MG per tablet, Take 1 tablet by mouth 2 (two) times daily. , Disp: , Rfl:  .  Chlorpheniramine Maleate (ALLERGY PO), Take 1 tablet by mouth daily as needed (allergies)., Disp: , Rfl:  .  cholecalciferol (VITAMIN D) 1000 units tablet, Take 2,000 Units by mouth daily at 12 noon. , Disp: , Rfl:  .  cloNIDine (CATAPRES) 0.2 MG tablet, Take 1 tablet (0.2 mg total) by mouth 2 (two) times daily., Disp: 90 tablet, Rfl: 1 .  cloNIDine (CATAPRES) 0.2 MG tablet, Take 1 tablet (0.2 mg total) by mouth 2 (two) times daily., Disp: 60 tablet, Rfl: 0 .  insulin glargine (LANTUS) 100 UNIT/ML injection, Inject 45 Units into the skin at bedtime., Disp: , Rfl:  .  insulin lispro (HUMALOG) 100 UNIT/ML injection, Inject 12-14 Units into the skin 2 (two) times daily. 12 units with lunch and 14 units with dinner, Disp: , Rfl:  .  insulin lispro protamine-lispro (HUMALOG 75/25 MIX) (75-25) 100 UNIT/ML SUSP injection, Inject 19-38 Units into the skin daily with breakfast., Disp: , Rfl:  .   levothyroxine (SYNTHROID, LEVOTHROID) 100 MCG tablet, Take 100 mcg by mouth daily before breakfast., Disp: , Rfl:  .  metoprolol tartrate (LOPRESSOR) 25 MG tablet, Take 1 tablet (25 mg total) by mouth 2 (two) times daily., Disp: 180 tablet, Rfl: 0 .  Multiple Vitamin (MULTIVITAMIN) capsule, Take 1 capsule by mouth daily at 12 noon. , Disp: , Rfl:  .  nitroGLYCERIN (NITROSTAT) 0.4 MG SL tablet, Place 1 tablet (0.4 mg total) under the tongue every 5 (five) minutes as needed for chest pain., Disp: 30 tablet, Rfl: 12 .  Polyethyl Glycol-Propyl Glycol (SYSTANE OP), Apply 1 drop to eye as needed (dry eyes)., Disp: , Rfl:   Past Medical History: Past Medical History:  Diagnosis Date  . CAD (coronary artery disease)    a. 04/2017 Cath: LM 80, LAD 80p, 49m LCX 95ost, 751mEF 45-50%; b. 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  . Diastolic dysfunction    a. 04/2017 Echo: EF 55-60%, no rwma, Gr1 DD, mildly dil LA.  . Marland Kitchenyperlipidemia   . Hypertensive heart disease   . Insulin dependent diabetes mellitus (HCC)     Tobacco Use: History  Smoking Status  . Never Smoker  Smokeless Tobacco  . Never Used    Labs: Recent Review Flowsheet Data    Labs for ITP Cardiac and Pulmonary Rehab Latest Ref Rng & Units 05/09/2017 05/09/2017 05/09/2017 05/09/2017 05/10/2017   Cholestrol 0 -  200 mg/dL - - - - -   LDLCALC 0 - 99 mg/dL - - - - -   HDL >40 mg/dL - - - - -   Trlycerides <150 mg/dL - - - - -   Hemoglobin A1c 4.8 - 5.6 % - - - - -   PHART 7.350 - 7.450 7.455(H) 7.355 7.381 - -   PCO2ART 32.0 - 48.0 mmHg 33.3 40.1 41.3 - -   HCO3 20.0 - 28.0 mmol/L 23.7 22.6 24.6 - -   TCO2 0 - 100 mmol/L '25 24 26 24 23   '$ ACIDBASEDEF 0.0 - 2.0 mmol/L - 3.0(H) 1.0 - -   O2SAT % 99.0 97.0 97.0 - -       Exercise Target Goals:    Exercise Program Goal: Individual exercise prescription set with THRR, safety & activity barriers. Participant demonstrates ability to understand and report RPE using BORG scale, to  self-measure pulse accurately, and to acknowledge the importance of the exercise prescription.  Exercise Prescription Goal: Starting with aerobic activity 30 plus minutes a day, 3 days per week for initial exercise prescription. Provide home exercise prescription and guidelines that participant acknowledges understanding prior to discharge.  Activity Barriers & Risk Stratification:     Activity Barriers & Cardiac Risk Stratification - 06/09/17 1429      Activity Barriers & Cardiac Risk Stratification   Activity Barriers Back Problems   Cardiac Risk Stratification High      6 Minute Walk:     6 Minute Walk    Row Name 06/09/17 1506         6 Minute Walk   Phase Initial     Distance 900 feet     Walk Time 6 minutes     # of Rest Breaks 0     MPH 1.7     METS 2     RPE 15     VO2 Peak 7.03     Symptoms No     Resting HR 68 bpm     Resting BP 130/58     Max Ex. HR 95 bpm     Max Ex. BP 132/48     2 Minute Post BP 128/50        Oxygen Initial Assessment:   Oxygen Re-Evaluation:   Oxygen Discharge (Final Oxygen Re-Evaluation):   Initial Exercise Prescription:     Initial Exercise Prescription - 06/09/17 1500      Date of Initial Exercise RX and Referring Provider   Date 06/09/17   Referring Provider Arida     Treadmill   MPH 1.5   Grade 0   Minutes 15   METs 2     NuStep   Level 2   SPM 60   Minutes 15   METs 2     Biostep-RELP   Level 2   SPM 50   Minutes 15   METs 2     Track   Laps 25   Minutes 15   METs 2     Prescription Details   Frequency (times per week) 3   Duration Progress to 30 minutes of continuous aerobic without signs/symptoms of physical distress     Intensity   THRR 40-80% of Max Heartrate 99-130   Ratings of Perceived Exertion 11-13   Perceived Dyspnea 0-4     Resistance Training   Training Prescription Yes   Weight 2   Reps 10-15      Perform Capillary Blood Glucose checks as  needed.  Exercise  Prescription Changes:     Exercise Prescription Changes    Row Name 06/09/17 1400 06/23/17 1100           Response to Exercise   Blood Pressure (Admit) 130/58 134/62      Blood Pressure (Exercise) 132/48 112/52      Blood Pressure (Exit) 128/50 148/80      Heart Rate (Admit) 70 bpm 81 bpm      Heart Rate (Exercise) 95 bpm 101 bpm      Heart Rate (Exit) 67 bpm 70 bpm      Oxygen Saturation (Admit) 100 %  -      Oxygen Saturation (Exercise) 100 %  -      Rating of Perceived Exertion (Exercise) 15 17      Symptoms  - none      Duration  - Progress to 45 minutes of aerobic exercise without signs/symptoms of physical distress      Intensity  - THRR unchanged        Progression   Progression  - Continue to progress workloads to maintain intensity without signs/symptoms of physical distress.      Average METs  - 1.85        Resistance Training   Training Prescription  - Yes      Weight  - 2      Reps  - 10-15        Interval Training   Interval Training  - No        Treadmill   MPH  - 1.2      Grade  - 0      Minutes  - 15      METs  - 1.9        NuStep   Level  - 2      Minutes  - 15      METs  - 1.8         Exercise Comments:   Exercise Goals and Review:     Exercise Goals    Row Name 06/09/17 1506             Exercise Goals   Increase Physical Activity Yes       Intervention Provide advice, education, support and counseling about physical activity/exercise needs.;Develop an individualized exercise prescription for aerobic and resistive training based on initial evaluation findings, risk stratification, comorbidities and participant's personal goals.       Expected Outcomes Achievement of increased cardiorespiratory fitness and enhanced flexibility, muscular endurance and strength shown through measurements of functional capacity and personal statement of participant.       Increase Strength and Stamina Yes       Intervention Provide advice, education,  support and counseling about physical activity/exercise needs.;Develop an individualized exercise prescription for aerobic and resistive training based on initial evaluation findings, risk stratification, comorbidities and participant's personal goals.       Expected Outcomes Achievement of increased cardiorespiratory fitness and enhanced flexibility, muscular endurance and strength shown through measurements of functional capacity and personal statement of participant.          Exercise Goals Re-Evaluation :     Exercise Goals Re-Evaluation    Row Name 06/23/17 1142             Exercise Goal Re-Evaluation   Exercise Goals Review Increase Physical Activity;Increase Strength and Stamina       Comments Chyrel will need to progress slowly with exercise.  Her intital TM speed was reduced.  Staff will monitor progress.       Expected Outcomes Short - Trinna will attend HT regularly.  Long - Grethel will increase overall fitness          Discharge Exercise Prescription (Final Exercise Prescription Changes):     Exercise Prescription Changes - 06/23/17 1100      Response to Exercise   Blood Pressure (Admit) 134/62   Blood Pressure (Exercise) 112/52   Blood Pressure (Exit) 148/80   Heart Rate (Admit) 81 bpm   Heart Rate (Exercise) 101 bpm   Heart Rate (Exit) 70 bpm   Rating of Perceived Exertion (Exercise) 17   Symptoms none   Duration Progress to 45 minutes of aerobic exercise without signs/symptoms of physical distress   Intensity THRR unchanged     Progression   Progression Continue to progress workloads to maintain intensity without signs/symptoms of physical distress.   Average METs 1.85     Resistance Training   Training Prescription Yes   Weight 2   Reps 10-15     Interval Training   Interval Training No     Treadmill   MPH 1.2   Grade 0   Minutes 15   METs 1.9     NuStep   Level 2   Minutes 15   METs 1.8      Nutrition:  Target Goals: Understanding of  nutrition guidelines, daily intake of sodium '1500mg'$ , cholesterol '200mg'$ , calories 30% from fat and 7% or less from saturated fats, daily to have 5 or more servings of fruits and vegetables.  Biometrics:     Pre Biometrics - 06/09/17 1505      Pre Biometrics   Height '5\' 9"'$  (1.753 m)   Weight 182 lb 6.4 oz (82.7 kg)   Waist Circumference 41.25 inches   Hip Circumference 38.5 inches   Waist to Hip Ratio 1.07 %   BMI (Calculated) 26.92       Nutrition Therapy Plan and Nutrition Goals:   Nutrition Discharge: Rate Your Plate Scores:   Nutrition Goals Re-Evaluation:   Nutrition Goals Discharge (Final Nutrition Goals Re-Evaluation):   Psychosocial: Target Goals: Acknowledge presence or absence of significant depression and/or stress, maximize coping skills, provide positive support system. Participant is able to verbalize types and ability to use techniques and skills needed for reducing stress and depression.   Initial Review & Psychosocial Screening:   Quality of Life Scores:      Quality of Life - 06/09/17 1426      Quality of Life Scores   Health/Function Pre 20.43 %   Socioeconomic Pre 21 %   Psych/Spiritual Pre 20.57 %   Family Pre 20.5 %   GLOBAL Pre 20.58 %      PHQ-9: Recent Review Flowsheet Data    Depression screen [facility] 2/9 06/09/2017   Decreased Interest 0   Down, Depressed, Hopeless 0   PHQ - 2 Score 0   Altered sleeping 1   Tired, decreased energy 0   Change in appetite 0   Feeling bad or failure about yourself  0   Trouble concentrating 0   Moving slowly or fidgety/restless 0   Suicidal thoughts 0   PHQ-9 Score 1   Difficult doing work/chores Not difficult at all     Interpretation of Total Score  Total Score Depression Severity:  1-4 = Minimal depression, 5-9 = Mild depression, 10-14 = Moderate depression, 15-19 = Moderately severe depression, 20-27 = Severe depression  Psychosocial Evaluation and Intervention:     Psychosocial  Evaluation - 06/22/17 1703      Psychosocial Evaluation & Interventions   Interventions Encouraged to exercise with the program and follow exercise prescription   Comments Counselor met with Ms. Pichette Inez Catalina) today for initial psychosocial evaluation.  She is a 76 year old who had a CABGx3 on 7/23.  Xandrea has a strong support system with a spouse of 31 years; a daughter; her in-laws; and active involvement in her local church.  Naliah states that she sleeps well and has a good appetite.  Other than her health issues with Cardiac concerns and diabetes, she has minimal stress in her life.  Channa denies a history of depression or anxiety or any current symptoms.  She has goals to "get healthy" and get back to her normal activities.  Bellamy will be followed by staff throughout the course of this program.     Expected Outcomes Blossom will benefit from consistent exercise to achieve  her stated goals.  The educational and psychoeducational components of this program will be helpful in learning to understand and manage her illness in a positive way.  Sonoma will be followed by staff.   Continue Psychosocial Services  Follow up required by staff      Psychosocial Re-Evaluation:   Psychosocial Discharge (Final Psychosocial Re-Evaluation):   Vocational Rehabilitation: Provide vocational rehab assistance to qualifying candidates.   Vocational Rehab Evaluation & Intervention:     Vocational Rehab - 06/09/17 1427      Initial Vocational Rehab Evaluation & Intervention   Assessment shows need for Vocational Rehabilitation No      Education: Education Goals: Education classes will be provided on a variety of topics geared toward better understanding of heart health and risk factor modification. Participant will state understanding/return demonstration of topics presented as noted by education test scores.  Learning Barriers/Preferences:     Learning Barriers/Preferences - 06/09/17 1427       Learning Barriers/Preferences   Learning Barriers None   Learning Preferences Individual Instruction      Education Topics: General Nutrition Guidelines/Fats and Fiber: -Group instruction provided by verbal, written material, models and posters to present the general guidelines for heart healthy nutrition. Gives an explanation and review of dietary fats and fiber.   Controlling Sodium/Reading Food Labels: -Group verbal and written material supporting the discussion of sodium use in heart healthy nutrition. Review and explanation with models, verbal and written materials for utilization of the food label.   Exercise Physiology & Risk Factors: - Group verbal and written instruction with models to review the exercise physiology of the cardiovascular system and associated critical values. Details cardiovascular disease risk factors and the goals associated with each risk factor.   Cardiac Rehab from 06/27/2017 in [facility] Cardiac and Pulmonary Rehab  Date  06/22/17  Educator  Nada Maclachlan, EP  Instruction Review Code  2- Demonstrated Understanding      Aerobic Exercise & Resistance Training: - Gives group verbal and written discussion on the health impact of inactivity. On the components of aerobic and resistive training programs and the benefits of this training and how to safely progress through these programs.   Cardiac Rehab from 06/27/2017 in [facility] Cardiac and Pulmonary Rehab  Date  06/27/17  Educator  [facility]  Instruction Review Code  1- Geologist, engineering, Balance, General Exercise Guidelines: - Provides group verbal and written instruction on the benefits of flexibility and balance training programs.  Provides general exercise guidelines with specific guidelines to those with heart or lung disease. Demonstration and skill practice provided.   Stress Management: - Provides group verbal and written instruction about the health risks of elevated stress, cause of high  stress, and healthy ways to reduce stress.   Depression: - Provides group verbal and written instruction on the correlation between heart/lung disease and depressed mood, treatment options, and the stigmas associated with seeking treatment.   Anatomy & Physiology of the Heart: - Group verbal and written instruction and models provide basic cardiac anatomy and physiology, with the coronary electrical and arterial systems. Review of: AMI, Angina, Valve disease, Heart Failure, Cardiac Arrhythmia, Pacemakers, and the ICD.   Cardiac Procedures: - Group verbal and written instruction to review commonly prescribed medications for heart disease. Reviews the medication, class of the drug, and side effects. Includes the steps to properly store meds and maintain the prescription regimen. (beta blockers and nitrates)   Cardiac Medications I: - Group verbal and written instruction to review commonly prescribed medications for heart disease. Reviews the medication, class of the drug, and side effects. Includes the steps to properly store meds and maintain the prescription regimen.   Cardiac Medications II: -Group verbal and written instruction to review commonly prescribed medications for heart disease. Reviews the medication, class of the drug, and side effects. (all other drug classes)    Go Sex-Intimacy & Heart Disease, Get SMART - Goal Setting: - Group verbal and written instruction through game format to discuss heart disease and the return to sexual intimacy. Provides group verbal and written material to discuss and apply goal setting through the application of the S.M.A.R.T. Method.   Other Matters of the Heart: - Provides group verbal, written materials and models to describe Heart Failure, Angina, Valve Disease, Peripheral Artery Disease, and Diabetes in the realm of heart disease. Includes description of the disease process and treatment options available to the cardiac  patient.   Exercise & Equipment Safety: - Individual verbal instruction and demonstration of equipment use and safety with use of the equipment.   Cardiac Rehab from 06/27/2017 in [facility] Cardiac and Pulmonary Rehab  Date  06/09/17  Educator  [facility]      Infection Prevention: - Provides verbal and written material to individual with discussion of infection control including proper hand washing and proper equipment cleaning during exercise session.   Cardiac Rehab from 06/27/2017 in [facility] Cardiac and Pulmonary Rehab  Date  06/09/17  Educator  [facility]      Falls Prevention: - Provides verbal and written material to individual with discussion of falls prevention and safety.   Cardiac Rehab from 06/27/2017 in [facility] Cardiac and Pulmonary Rehab  Date  06/09/17  Educator  [facility]  Instruction Review Code (retired)  2- meets goals/outcomes      Diabetes: - Individual verbal and written instruction to review signs/symptoms of diabetes, desired ranges of glucose level fasting, after meals and with exercise. Acknowledge that pre and post exercise glucose checks will be done for 3 sessions at entry of program.   Cardiac Rehab from 06/27/2017 in [facility] Cardiac and Pulmonary Rehab  Date  06/09/17  Educator  [facility]      Other: -Provides group and verbal instruction on various topics (see comments)    Knowledge Questionnaire Score:     Knowledge Questionnaire Score - 06/09/17 1427      Knowledge Questionnaire Score   Pre Score 17/28   Correct answers reviewed with Inez Catalina.  Core Components/Risk Factors/Patient Goals at Admission:     Personal Goals and Risk Factors at Admission - 06/09/17 1421      Core Components/Risk Factors/Patient Goals on Admission    Weight Management Weight Gain;Yes   Intervention Weight Management: Provide education and appropriate resources to help participant work on and attain dietary goals.;Weight Management: Develop a combined nutrition and exercise program designed to  reach desired caloric intake, while maintaining appropriate intake of nutrient and fiber, sodium and fats, and appropriate energy expenditure required for the weight goal.  Patient states she would like to gain weight in her extremeties because they are "looking too thin"   Admit Weight 182 lb 6.4 oz (82.7 kg)   Goal Weight: Short Term 184 lb (83.5 kg)   Goal Weight: Long Term 186 lb (84.4 kg)  patient unsure, just wants to not look too thin in arms   Expected Outcomes Short Term: Continue to assess and modify interventions until short term weight is achieved;Long Term: Adherence to nutrition and physical activity/exercise program aimed toward attainment of established weight goal;Weight Maintenance: Understanding of the daily nutrition guidelines, which includes 25-35% calories from fat, 7% or less cal from saturated fats, less than '200mg'$  cholesterol, less than 1.5gm of sodium, & 5 or more servings of fruits and vegetables daily;Understanding recommendations for meals to include 15-35% energy as protein, 25-35% energy from fat, 35-60% energy from carbohydrates, less than '200mg'$  of dietary cholesterol, 20-35 gm of total fiber daily;Understanding of distribution of calorie intake throughout the day with the consumption of 4-5 meals/snacks   Diabetes Yes   Intervention Provide education about signs/symptoms and action to take for hypo/hyperglycemia.;Provide education about proper nutrition, including hydration, and aerobic/resistive exercise prescription along with prescribed medications to achieve blood glucose in normal ranges: Fasting glucose 65-99 mg/dL   Expected Outcomes Short Term: Participant verbalizes understanding of the signs/symptoms and immediate care of hyper/hypoglycemia, proper foot care and importance of medication, aerobic/resistive exercise and nutrition plan for blood glucose control.;Long Term: Attainment of HbA1C < 7%.   Heart Failure Yes   Intervention Provide a combined exercise and  nutrition program that is supplemented with education, support and counseling about heart failure. Directed toward relieving symptoms such as shortness of breath, decreased exercise tolerance, and extremity edema.   Expected Outcomes Improve functional capacity of life;Short term: Attendance in program 2-3 days a week with increased exercise capacity. Reported lower sodium intake. Reported increased fruit and vegetable intake. Reports medication compliance.;Short term: Daily weights obtained and reported for increase. Utilizing diuretic protocols set by physician.;Long term: Adoption of self-care skills and reduction of barriers for early signs and symptoms recognition and intervention leading to self-care maintenance.      Core Components/Risk Factors/Patient Goals Review:    Core Components/Risk Factors/Patient Goals at Discharge (Final Review):    ITP Comments:     ITP Comments    Row Name 06/09/17 1416 06/23/17 1646 06/29/17 1142       ITP Comments Med Review Completed. Initial ITP created. Diagnosis can be found in [facility] 05/13/17 Jalesha felt a little shakey on the treadmill. Blood sugar checked at 117, was given peanut butter crackers and continued to exercise on a seated piece of equipment. 30 day review. Continue with ITP unless directed changes per Medical Director review.          Comments:

## 2017-06-29 NOTE — Progress Notes (Signed)
Daily Session Note  Patient Details  Name: Carrie Kane MRN: [id number] Date of Birth: 1941/08/08 Referring Provider:     Cardiac Rehab from 06/09/2017 in [facility] Cardiac and Pulmonary Rehab  Referring Provider  Arida      Encounter Date: 06/29/2017  Check In:     Session Check In - 06/29/17 1711      Check-In   Location [facility]-Cardiac & Pulmonary Rehab   Staff Present Renita Papa, RN BSN;Carroll Enterkin, RN, Vickki Hearing, BA, ACSM CEP, Exercise Physiologist   Supervising physician immediately available to respond to emergencies See telemetry face sheet for immediately available ER MD   Medication changes reported     No   Fall or balance concerns reported    No   Warm-up and Cool-down Performed on first and last piece of equipment   Resistance Training Performed Yes   VAD Patient? No     Pain Assessment   Currently in Pain? No/denies         History  Smoking Status  . Never Smoker  Smokeless Tobacco  . Never Used    Goals Met:  Proper associated with RPD/PD & O2 Sat Independence with exercise equipment Exercise tolerated well No report of cardiac concerns or symptoms Strength training completed today  Goals Unmet:  Not Applicable  Comments: Pt able to follow exercise prescription today without complaint.  Will continue to monitor for progression.    Dr. Emily Filbert is Medical Director for [place] and [facility].

## 2017-07-04 DIAGNOSIS — Z951 Presence of aortocoronary bypass graft: Secondary | ICD-10-CM | POA: Diagnosis not present

## 2017-07-04 LAB — GLUCOSE, CAPILLARY: Glucose-Capillary: 317 mg/dL — ABNORMAL HIGH (ref 65–99)

## 2017-07-04 NOTE — Progress Notes (Signed)
Daily Session Note  Patient Details  Name: Carrie Kane MRN: [id number] Date of Birth: Jan 08, 1941 Referring Provider:     Cardiac Rehab from 06/09/2017 in [facility] Cardiac and Pulmonary Rehab  Referring Provider  Arida      Encounter Date: 07/04/2017  Check In:     Session Check In - 07/04/17 1733      Check-In   Location [facility]-Cardiac & Pulmonary Rehab   Staff Present Nada Maclachlan, BA, ACSM CEP, Exercise Physiologist;Kelly Amedeo Plenty, BS, ACSM CEP, Exercise Physiologist;Meredith Sherryll Burger, RN BSN   Supervising physician immediately available to respond to emergencies See telemetry face sheet for immediately available ER MD   Medication changes reported     No   Fall or balance concerns reported    No   Warm-up and Cool-down Performed on first and last piece of equipment   Resistance Training Performed Yes   VAD Patient? No     VAD patient   Has back up controller? No     Pain Assessment   Currently in Pain? No/denies         History  Smoking Status  . Never Smoker  Smokeless Tobacco  . Never Used    Goals Met:  Independence with exercise equipment Exercise tolerated well Strength training completed today  Goals Unmet:  Not Applicable  Comments: [name] only completed part of the aerobic exercise session due to high BG.   Dr. Emily Filbert is Medical Director for [place] and [facility].

## 2017-07-06 ENCOUNTER — Encounter: Payer: Medicare Other | Admitting: *Deleted

## 2017-07-06 DIAGNOSIS — Z951 Presence of aortocoronary bypass graft: Secondary | ICD-10-CM | POA: Diagnosis not present

## 2017-07-06 NOTE — Progress Notes (Signed)
Daily Session Note  Patient Details  Name: Carrie Kane MRN: [id number] Date of Birth: 24-Apr-1941 Referring Provider:     Cardiac Rehab from 06/09/2017 in [facility] Cardiac and Pulmonary Rehab  Referring Provider  Arida      Encounter Date: 07/06/2017  Check In:     Session Check In - 07/06/17 1629      Check-In   Location [facility]-Cardiac & Pulmonary Rehab   Staff Present Renita Papa, RN BSN;Carroll Enterkin, RN, Vickki Hearing, BA, ACSM CEP, Exercise Physiologist   Supervising physician immediately available to respond to emergencies See telemetry face sheet for immediately available ER MD   Medication changes reported     No   Fall or balance concerns reported    No   Warm-up and Cool-down Performed on first and last piece of equipment   Resistance Training Performed Yes   VAD Patient? No     Pain Assessment   Currently in Pain? No/denies           Exercise Prescription Changes - 07/06/17 1500      Response to Exercise   Blood Pressure (Admit) 132/82   Blood Pressure (Exercise) 140/80   Blood Pressure (Exit) 110/60   Heart Rate (Admit) 62 bpm   Heart Rate (Exercise) 77 bpm   Heart Rate (Exit) 69 bpm   Rating of Perceived Exertion (Exercise) 12   Symptoms high BG - see daily note   Duration Progress to 45 minutes of aerobic exercise without signs/symptoms of physical distress   Intensity THRR unchanged     Progression   Progression Continue to progress workloads to maintain intensity without signs/symptoms of physical distress.   Average METs 2.15     Resistance Training   Training Prescription Yes   Weight 2   Reps 10-15     Interval Training   Interval Training No     Treadmill   MPH 1.5   Grade 0   Minutes 15   METs 2.15      History  Smoking Status  . Never Smoker  Smokeless Tobacco  . Never Used    Goals Met:  Proper associated with RPD/PD & O2 Sat Independence with exercise equipment Exercise tolerated well No report of cardiac  concerns or symptoms Strength training completed today  Goals Unmet:  Not Applicable  Comments: Pt able to follow exercise prescription today without complaint.  Will continue to monitor for progression.    Dr. Emily Filbert is Medical Director for [place] and [facility].

## 2017-07-11 DIAGNOSIS — Z951 Presence of aortocoronary bypass graft: Secondary | ICD-10-CM

## 2017-07-11 NOTE — Progress Notes (Signed)
Daily Session Note  Patient Details  Name: Carrie Kane MRN: [id number] Date of Birth: [birth year] Referring Provider:     Cardiac Rehab from 06/09/2017 in [facility] Cardiac and Pulmonary Rehab  Referring Provider  Arida      Encounter Date: 07/11/2017  Check In:     Session Check In - 07/11/17 1751      Check-In   Location [facility]-Cardiac & Pulmonary Rehab   Staff Present Nada Maclachlan, BA, ACSM CEP, Exercise Physiologist;Kelly Amedeo Plenty, BS, ACSM CEP, Exercise Physiologist;Meredith Sherryll Burger, RN BSN   Supervising physician immediately available to respond to emergencies See telemetry face sheet for immediately available ER MD   Medication changes reported     No   Fall or balance concerns reported    No   Warm-up and Cool-down Performed on first and last piece of equipment   Resistance Training Performed Yes   VAD Patient? No     VAD patient   Has back up controller? No     Pain Assessment   Currently in Pain? No/denies         History  Smoking Status  . Never Smoker  Smokeless Tobacco  . Never Used    Goals Met:  Independence with exercise equipment Exercise tolerated well No report of cardiac concerns or symptoms Strength training completed today  Goals Unmet:  Not Applicable  Comments: Pt able to follow exercise prescription today without complaint.  Will continue to monitor for progression.    Dr. Emily Filbert is Medical Director for [place] and [facility].

## 2017-07-13 DIAGNOSIS — Z951 Presence of aortocoronary bypass graft: Secondary | ICD-10-CM

## 2017-07-13 NOTE — Progress Notes (Signed)
Daily Session Note  Patient Details  Name: Carrie Kane MRN: [id number] Date of Birth: [birth year] Referring Provider:     Cardiac Rehab from 06/09/2017 in [facility] Cardiac and Pulmonary Rehab  Referring Provider  Arida      Encounter Date: 07/13/2017  Check In:     Session Check In - 07/13/17 1654      Check-In   Location [facility]-Cardiac & Pulmonary Rehab   Staff Present Gerlene Burdock, RN, Vickki Hearing, BA, ACSM CEP, Exercise Physiologist;Meredith Sherryll Burger, RN BSN   Supervising physician immediately available to respond to emergencies See telemetry face sheet for immediately available ER MD   Medication changes reported     No   Fall or balance concerns reported    No   Warm-up and Cool-down Performed on first and last piece of equipment   Resistance Training Performed Yes   VAD Patient? No     VAD patient   Has back up controller? No     Pain Assessment   Currently in Pain? No/denies         History  Smoking Status  . Never Smoker  Smokeless Tobacco  . Never Used    Goals Met:  Independence with exercise equipment Exercise tolerated well No report of cardiac concerns or symptoms Strength training completed today  Goals Unmet:  Not Applicable  Comments: Pt able to follow exercise prescription today without complaint.  Will continue to monitor for progression.    Dr. Emily Filbert is Medical Director for [name] and [facility].

## 2017-07-14 ENCOUNTER — Encounter: Payer: Medicare Other | Admitting: *Deleted

## 2017-07-14 DIAGNOSIS — Z951 Presence of aortocoronary bypass graft: Secondary | ICD-10-CM

## 2017-07-14 NOTE — Progress Notes (Signed)
Daily Session Note  Patient Details  Name: Carrie Kane MRN: [id number] Date of Birth: 1940-10-28 Referring Provider:     Cardiac Rehab from 06/09/2017 in [facility] Cardiac and Pulmonary Rehab  Referring Provider  Arida      Encounter Date: 07/14/2017  Check In:     Session Check In - 07/14/17 1725      Check-In   Location [facility]-Cardiac & Pulmonary Rehab   Staff Present Earlean Shawl, BS, ACSM CEP, Exercise Physiologist;Joseph Tessie Fass RCP,RRT,BSRT;Carroll Enterkin, RN, BSN   Supervising physician immediately available to respond to emergencies See telemetry face sheet for immediately available ER MD   Medication changes reported     No   Fall or balance concerns reported    No   Warm-up and Cool-down Performed on first and last piece of equipment   Resistance Training Performed Yes   VAD Patient? No     Pain Assessment   Currently in Pain? No/denies   Multiple Pain Sites No         History  Smoking Status  . Never Smoker  Smokeless Tobacco  . Never Used    Goals Met:  Independence with exercise equipment Exercise tolerated well No report of cardiac concerns or symptoms Strength training completed today  Goals Unmet:  Not Applicable  Comments: Pt able to follow exercise prescription today without complaint.  Will continue to monitor for progression.    Dr. Emily Filbert is Medical Director for [place] and [facility].

## 2017-07-18 ENCOUNTER — Encounter: Payer: Medicare Other | Attending: Cardiovascular Disease

## 2017-07-18 DIAGNOSIS — Z951 Presence of aortocoronary bypass graft: Secondary | ICD-10-CM

## 2017-07-18 NOTE — Progress Notes (Signed)
Daily Session Note  Patient Details  Name: Carrie Kane MRN: [id number] Date of Birth: 1941-01-18 Referring Provider:     Cardiac Rehab from 06/09/2017 in [facility] Cardiac and Pulmonary Rehab  Referring Provider  [place]      Encounter Date: 07/18/2017  Check In:     Session Check In - 07/18/17 1745      Check-In   Location [facility]-Cardiac & Pulmonary Rehab   Staff Present Nyoka Cowden, RN, BSN, Kela Millin, BA, ACSM CEP, Exercise Physiologist;Kelly Amedeo Plenty, BS, ACSM CEP, Exercise Physiologist   Supervising physician immediately available to respond to emergencies See telemetry face sheet for immediately available ER MD   Medication changes reported     No   Fall or balance concerns reported    No   Warm-up and Cool-down Performed on first and last piece of equipment   Resistance Training Performed Yes   VAD Patient? No     VAD patient   Has back up controller? No     Pain Assessment   Currently in Pain? No/denies         History  Smoking Status  . Never Smoker  Smokeless Tobacco  . Never Used    Goals Met:  Independence with exercise equipment Exercise tolerated well No report of cardiac concerns or symptoms Strength training completed today  Goals Unmet:  Not Applicable  Comments: Pt able to follow exercise prescription today without complaint.  Will continue to monitor for progression.    Dr. Emily Filbert is Medical Director for [place] and [facility].

## 2017-07-20 DIAGNOSIS — Z951 Presence of aortocoronary bypass graft: Secondary | ICD-10-CM

## 2017-07-20 NOTE — Progress Notes (Signed)
Daily Session Note  Patient Details  Name: Carrie Kane MRN: [id number] Date of Birth: 09-06-1941 Referring Provider:     Cardiac Rehab from 06/09/2017 in [facility] Cardiac and Pulmonary Rehab  Referring Provider  [place]      Encounter Date: 07/20/2017  Check In:     Session Check In - 07/20/17 1708      Check-In   Location [facility]-Cardiac & Pulmonary Rehab   Staff Present Renita Papa, RN Vickki Hearing, BA, ACSM CEP, Exercise Physiologist;Carroll Enterkin, RN, BSN   Supervising physician immediately available to respond to emergencies See telemetry face sheet for immediately available ER MD   Medication changes reported     No   Fall or balance concerns reported    No   Warm-up and Cool-down Performed on first and last piece of equipment   Resistance Training Performed Yes   VAD Patient? No     Pain Assessment   Currently in Pain? No/denies           Exercise Prescription Changes - 07/20/17 1500      Response to Exercise   Blood Pressure (Admit) 132/72   Blood Pressure (Exercise) 124/58   Blood Pressure (Exit) 138/88   Heart Rate (Admit) 57 bpm   Heart Rate (Exercise) 93 bpm   Heart Rate (Exit) 53 bpm   Rating of Perceived Exertion (Exercise) 12   Duration Progress to 45 minutes of aerobic exercise without signs/symptoms of physical distress   Intensity THRR unchanged     Progression   Progression Continue to progress workloads to maintain intensity without signs/symptoms of physical distress.   Average METs 2.15     Resistance Training   Training Prescription Yes   Weight 2   Reps 10-15     Interval Training   Interval Training No     Treadmill   MPH 1.5   Grade 0   Minutes 15   METs 2.15     NuStep   Level 3   Minutes 15      History  Smoking Status  . Never Smoker  Smokeless Tobacco  . Never Used    Goals Met:  Independence with exercise equipment Exercise tolerated well No report of cardiac concerns or symptoms Strength training  completed today  Goals Unmet:  Not Applicable  Comments: Pt able to follow exercise prescription today without complaint.  Will continue to monitor for progression.    Dr. Emily Filbert is Medical Director for [place] and [facility].

## 2017-07-21 ENCOUNTER — Encounter: Payer: Self-pay | Admitting: *Deleted

## 2017-07-21 ENCOUNTER — Telehealth: Payer: Self-pay | Admitting: *Deleted

## 2017-07-21 NOTE — Telephone Encounter (Signed)
Carrie Kane called and said her blood sugar dropped at home today so she doesn't feel comfortable coming to Cardiac Rehab this evening and driving herself.

## 2017-07-25 DIAGNOSIS — Z951 Presence of aortocoronary bypass graft: Secondary | ICD-10-CM | POA: Diagnosis not present

## 2017-07-25 NOTE — Progress Notes (Signed)
Daily Session Note  Patient Details  Name: Carrie Kane MRN: [id number] Date of Birth: 11/13/1940 Referring Provider:     Cardiac Rehab from 06/09/2017 in [facility] Cardiac and Pulmonary Rehab  Referring Provider  Arida      Encounter Date: 07/25/2017  Check In:     Session Check In - 07/25/17 1709      Check-In   Location [facility]-Cardiac & Pulmonary Rehab   Staff Present Nada Maclachlan, BA, ACSM CEP, Exercise Physiologist;Kelly Amedeo Plenty, BS, ACSM CEP, Exercise Physiologist;Meredith Sherryll Burger, RN BSN   Supervising physician immediately available to respond to emergencies See telemetry face sheet for immediately available ER MD   Medication changes reported     No   Fall or balance concerns reported    No   Warm-up and Cool-down Performed on first and last piece of equipment   Resistance Training Performed Yes   VAD Patient? No     VAD patient   Has back up controller? No     Pain Assessment   Currently in Pain? No/denies         History  Smoking Status  . Never Smoker  Smokeless Tobacco  . Never Used    Goals Met:  Independence with exercise equipment Exercise tolerated well No report of cardiac concerns or symptoms Strength training completed today  Goals Unmet:  Not Applicable  Comments: Pt able to follow exercise prescription today without complaint.  Will continue to monitor for progression.    Dr. Emily Filbert is Medical Director for [place] and [facility].

## 2017-07-27 ENCOUNTER — Encounter: Payer: Self-pay | Admitting: *Deleted

## 2017-07-27 ENCOUNTER — Encounter: Payer: Medicare Other | Admitting: *Deleted

## 2017-07-27 DIAGNOSIS — Z951 Presence of aortocoronary bypass graft: Secondary | ICD-10-CM

## 2017-07-27 NOTE — Progress Notes (Signed)
Daily Session Note  Patient Details  Name: Emonni Depasquale MRN: [id number] Date of Birth: [birth year] Referring Provider:     Cardiac Rehab from 06/09/2017 in [facility] Cardiac and Pulmonary Rehab  Referring Provider  Arida      Encounter Date: 07/27/2017  Check In:     Session Check In - 07/27/17 1625      Check-In   Location [facility]-Cardiac & Pulmonary Rehab   Staff Present Renita Papa, RN Vickki Hearing, BA, ACSM CEP, Exercise Physiologist;Carroll Enterkin, RN, BSN   Supervising physician immediately available to respond to emergencies See telemetry face sheet for immediately available ER MD   Medication changes reported     No   Fall or balance concerns reported    No   Warm-up and Cool-down Performed on first and last piece of equipment   Resistance Training Performed Yes   VAD Patient? No     Pain Assessment   Currently in Pain? No/denies         History  Smoking Status  . Never Smoker  Smokeless Tobacco  . Never Used    Goals Met:  Independence with exercise equipment Exercise tolerated well No report of cardiac concerns or symptoms Strength training completed today  Goals Unmet:  Not Applicable  Comments: Pt able to follow exercise prescription today without complaint.  Will continue to monitor for progression.    Dr. Emily Filbert is Medical Director for [place] and [facility].

## 2017-07-27 NOTE — Progress Notes (Signed)
Cardiac Individual Treatment Plan  Patient Details  Name: Carrie Kane MRN: [id number] Date of Birth: September 13, 1941 Referring Provider:     Cardiac Rehab from 06/09/2017 in [facility] Cardiac and Pulmonary Rehab  Referring Provider  Arida      Initial Encounter Date:    Cardiac Rehab from 06/09/2017 in [facility] Cardiac and Pulmonary Rehab  Date  06/09/17  Referring Provider  Kirke Corin      Visit Diagnosis: No diagnosis found.  Patient's Home Medications on Admission:  Current Outpatient Prescriptions:  .  alum & mag hydroxide-simeth (MAALOX/MYLANTA) 200-200-20 MG/5ML suspension, Take 15 mLs by mouth every 6 (six) hours as needed for indigestion or heartburn., Disp: , Rfl:  .  aspirin 325 MG EC tablet, Take 1 tablet (325 mg total) by mouth daily., Disp: 30 tablet, Rfl: 0 .  atorvastatin (LIPITOR) 40 MG tablet, Take 1 tablet (40 mg total) by mouth daily at 6 PM., Disp: 90 tablet, Rfl: 1 .  benazepril-hydrochlorthiazide (LOTENSIN HCT) 20-12.5 MG per tablet, Take 1 tablet by mouth 2 (two) times daily. , Disp: , Rfl:  .  Chlorpheniramine Maleate (ALLERGY PO), Take 1 tablet by mouth daily as needed (allergies)., Disp: , Rfl:  .  cholecalciferol (VITAMIN D) 1000 units tablet, Take 2,000 Units by mouth daily at 12 noon. , Disp: , Rfl:  .  cloNIDine (CATAPRES) 0.2 MG tablet, Take 1 tablet (0.2 mg total) by mouth 2 (two) times daily., Disp: 90 tablet, Rfl: 1 .  cloNIDine (CATAPRES) 0.2 MG tablet, Take 1 tablet (0.2 mg total) by mouth 2 (two) times daily., Disp: 60 tablet, Rfl: 0 .  insulin glargine (LANTUS) 100 UNIT/ML injection, Inject 45 Units into the skin at bedtime., Disp: , Rfl:  .  insulin lispro (HUMALOG) 100 UNIT/ML injection, Inject 12-14 Units into the skin 2 (two) times daily. 12 units with lunch and 14 units with dinner, Disp: , Rfl:  .  insulin lispro protamine-lispro (HUMALOG 75/25 MIX) (75-25) 100 UNIT/ML SUSP injection, Inject 19-38 Units into the skin daily with breakfast., Disp: , Rfl:  .   levothyroxine (SYNTHROID, LEVOTHROID) 100 MCG tablet, Take 100 mcg by mouth daily before breakfast., Disp: , Rfl:  .  metoprolol tartrate (LOPRESSOR) 25 MG tablet, Take 1 tablet (25 mg total) by mouth 2 (two) times daily., Disp: 180 tablet, Rfl: 0 .  Multiple Vitamin (MULTIVITAMIN) capsule, Take 1 capsule by mouth daily at 12 noon. , Disp: , Rfl:  .  nitroGLYCERIN (NITROSTAT) 0.4 MG SL tablet, Place 1 tablet (0.4 mg total) under the tongue every 5 (five) minutes as needed for chest pain., Disp: 30 tablet, Rfl: 12 .  Polyethyl Glycol-Propyl Glycol (SYSTANE OP), Apply 1 drop to eye as needed (dry eyes)., Disp: , Rfl:   Past Medical History: Past Medical History:  Diagnosis Date  . CAD (coronary artery disease)    a. 04/2017 Cath: LM 80, LAD 80p, 27m, LCX 95ost, 52m, EF 45-50%; b. 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  . Diastolic dysfunction    a. 04/2017 Echo: EF 55-60%, no rwma, Gr1 DD, mildly dil LA.  Marland Kitchen Hyperlipidemia   . Hypertensive heart disease   . Insulin dependent diabetes mellitus (HCC)     Tobacco Use: History  Smoking Status  . Never Smoker  Smokeless Tobacco  . Never Used    Labs: Recent Review Flowsheet Data    Labs for ITP Cardiac and Pulmonary Rehab Latest Ref Rng & Units 05/09/2017 05/09/2017 05/09/2017 05/09/2017 05/10/2017   Cholestrol 0 - 200  mg/dL - - - - -   LDLCALC 0 - 99 mg/dL - - - - -   HDL >40 mg/dL - - - - -   Trlycerides <150 mg/dL - - - - -   Hemoglobin A1c 4.8 - 5.6 % - - - - -   PHART 7.350 - 7.450 7.455(H) 7.355 7.381 - -   PCO2ART 32.0 - 48.0 mmHg 33.3 40.1 41.3 - -   HCO3 20.0 - 28.0 mmol/L 23.7 22.6 24.6 - -   TCO2 0 - 100 mmol/L '25 24 26 24 23   '$ ACIDBASEDEF 0.0 - 2.0 mmol/L - 3.0(H) 1.0 - -   O2SAT % 99.0 97.0 97.0 - -       Exercise Target Goals:    Exercise Program Goal: Individual exercise prescription set with THRR, safety & activity barriers. Participant demonstrates ability to understand and report RPE using BORG scale, to  self-measure pulse accurately, and to acknowledge the importance of the exercise prescription.  Exercise Prescription Goal: Starting with aerobic activity 30 plus minutes a day, 3 days per week for initial exercise prescription. Provide home exercise prescription and guidelines that participant acknowledges understanding prior to discharge.  Activity Barriers & Risk Stratification:     Activity Barriers & Cardiac Risk Stratification - 06/09/17 1429      Activity Barriers & Cardiac Risk Stratification   Activity Barriers Back Problems   Cardiac Risk Stratification High      6 Minute Walk:     6 Minute Walk    Row Name 06/09/17 1506         6 Minute Walk   Phase Initial     Distance 900 feet     Walk Time 6 minutes     # of Rest Breaks 0     MPH 1.7     METS 2     RPE 15     VO2 Peak 7.03     Symptoms No     Resting HR 68 bpm     Resting BP 130/58     Max Ex. HR 95 bpm     Max Ex. BP 132/48     2 Minute Post BP 128/50        Oxygen Initial Assessment:   Oxygen Re-Evaluation:   Oxygen Discharge (Final Oxygen Re-Evaluation):   Initial Exercise Prescription:     Initial Exercise Prescription - 06/09/17 1500      Date of Initial Exercise RX and Referring Provider   Date 06/09/17   Referring Provider Arida     Treadmill   MPH 1.5   Grade 0   Minutes 15   METs 2     NuStep   Level 2   SPM 60   Minutes 15   METs 2     Biostep-RELP   Level 2   SPM 50   Minutes 15   METs 2     Track   Laps 25   Minutes 15   METs 2     Prescription Details   Frequency (times per week) 3   Duration Progress to 30 minutes of continuous aerobic without signs/symptoms of physical distress     Intensity   THRR 40-80% of Max Heartrate 99-130   Ratings of Perceived Exertion 11-13   Perceived Dyspnea 0-4     Resistance Training   Training Prescription Yes   Weight 2   Reps 10-15      Perform Capillary Blood Glucose checks as needed.  Exercise  Prescription Changes:     Exercise Prescription Changes    Row Name 06/09/17 1400 06/23/17 1100 07/06/17 1500 07/20/17 1500       Response to Exercise   Blood Pressure (Admit) 130/58 134/62 132/82 132/72    Blood Pressure (Exercise) 132/48 112/52 140/80 124/58    Blood Pressure (Exit) 128/50 148/80 110/60 138/88    Heart Rate (Admit) 70 bpm 81 bpm 62 bpm 57 bpm    Heart Rate (Exercise) 95 bpm 101 bpm 77 bpm 93 bpm    Heart Rate (Exit) 67 bpm 70 bpm 69 bpm 53 bpm    Oxygen Saturation (Admit) 100 %  -  -  -    Oxygen Saturation (Exercise) 100 %  -  -  -    Rating of Perceived Exertion (Exercise) '15 17 12 12    '$ Symptoms  - none high BG - see daily note  -    Duration  - Progress to 45 minutes of aerobic exercise without signs/symptoms of physical distress Progress to 45 minutes of aerobic exercise without signs/symptoms of physical distress Progress to 45 minutes of aerobic exercise without signs/symptoms of physical distress    Intensity  - THRR unchanged THRR unchanged THRR unchanged      Progression   Progression  - Continue to progress workloads to maintain intensity without signs/symptoms of physical distress. Continue to progress workloads to maintain intensity without signs/symptoms of physical distress. Continue to progress workloads to maintain intensity without signs/symptoms of physical distress.    Average METs  - 1.85 2.15 2.15      Resistance Training   Training Prescription  - Yes Yes Yes    Weight  - '2 2 2    '$ Reps  - 10-15 10-15 10-15      Interval Training   Interval Training  - No No No      Treadmill   MPH  - 1.2 1.5 1.5    Grade  - 0 0 0    Minutes  - '15 15 15    '$ METs  - 1.9 2.15 2.15      NuStep   Level  - 2  - 3    Minutes  - 15  - 15    METs  - 1.8  -  -       Exercise Comments:     Exercise Comments    Row Name 07/04/17 1734           Exercise Comments Aseret was sweating on TM more than usual.  Her BG was 317.  She did the strength and  stetching exercises and education.          Exercise Goals and Review:     Exercise Goals    Row Name 06/09/17 1506             Exercise Goals   Increase Physical Activity Yes       Intervention Provide advice, education, support and counseling about physical activity/exercise needs.;Develop an individualized exercise prescription for aerobic and resistive training based on initial evaluation findings, risk stratification, comorbidities and participant's personal goals.       Expected Outcomes Achievement of increased cardiorespiratory fitness and enhanced flexibility, muscular endurance and strength shown through measurements of functional capacity and personal statement of participant.       Increase Strength and Stamina Yes       Intervention Provide advice, education, support and counseling about physical activity/exercise needs.;Develop an individualized exercise  prescription for aerobic and resistive training based on initial evaluation findings, risk stratification, comorbidities and participant's personal goals.       Expected Outcomes Achievement of increased cardiorespiratory fitness and enhanced flexibility, muscular endurance and strength shown through measurements of functional capacity and personal statement of participant.          Exercise Goals Re-Evaluation :     Exercise Goals Re-Evaluation    Row Name 06/23/17 1142 07/06/17 1526 07/20/17 1527         Exercise Goal Re-Evaluation   Exercise Goals Review Increase Physical Activity;Increase Strength and Stamina Increase Physical Activity;Increase Strength and Stamina Increase Physical Activity;Increase Strength and Stamina     Comments Lennon will need to progress slowly with exercise.   Her intital TM speed was reduced.  Staff will monitor progress. Ahnna has attended on a regular basis.  Staff will continue to monitor progression. Evann is tolerating exercise well.  Staff will continue to monitor for progress.      Expected Outcomes Short - Yohana will attend HT regularly.  Long - Mardella will increase overall fitness Short - Tiawana will continue to attend.  Long - Jennine will continue to build stamina. Short - Keliah will continue to attend HT.  Long - Bettty will progress levels on the machines and increase MET level.        Discharge Exercise Prescription (Final Exercise Prescription Changes):     Exercise Prescription Changes - 07/20/17 1500      Response to Exercise   Blood Pressure (Admit) 132/72   Blood Pressure (Exercise) 124/58   Blood Pressure (Exit) 138/88   Heart Rate (Admit) 57 bpm   Heart Rate (Exercise) 93 bpm   Heart Rate (Exit) 53 bpm   Rating of Perceived Exertion (Exercise) 12   Duration Progress to 45 minutes of aerobic exercise without signs/symptoms of physical distress   Intensity THRR unchanged     Progression   Progression Continue to progress workloads to maintain intensity without signs/symptoms of physical distress.   Average METs 2.15     Resistance Training   Training Prescription Yes   Weight 2   Reps 10-15     Interval Training   Interval Training No     Treadmill   MPH 1.5   Grade 0   Minutes 15   METs 2.15     NuStep   Level 3   Minutes 15      Nutrition:  Target Goals: Understanding of nutrition guidelines, daily intake of sodium '1500mg'$ , cholesterol '200mg'$ , calories 30% from fat and 7% or less from saturated fats, daily to have 5 or more servings of fruits and vegetables.  Biometrics:     Pre Biometrics - 06/09/17 1505      Pre Biometrics   Height '5\' 9"'$  (1.753 m)   Weight 182 lb 6.4 oz (82.7 kg)   Waist Circumference 41.25 inches   Hip Circumference 38.5 inches   Waist to Hip Ratio 1.07 %   BMI (Calculated) 26.92       Nutrition Therapy Plan and Nutrition Goals:   Nutrition Discharge: Rate Your Plate Scores:   Nutrition Goals Re-Evaluation:     Nutrition Goals Re-Evaluation    Row Name 07/13/17 1654             Goals    Comment Brandalynn wants to meet with the RD.  Appt not open til end of Oct so email was sent to see if she could get in sooner.  Expected Outcome SHort - Adelie will meet with RD.  Long - RD will provide info to help her stay healthy and attain weight goals.          Nutrition Goals Discharge (Final Nutrition Goals Re-Evaluation):     Nutrition Goals Re-Evaluation - 07/13/17 1654      Goals   Comment Niamya wants to meet with the RD.  Appt not open til end of Oct so email was sent to see if she could get in sooner.   Expected Outcome SHort - Jodee will meet with RD.  Long - RD will provide info to help her stay healthy and attain weight goals.      Psychosocial: Target Goals: Acknowledge presence or absence of significant depression and/or stress, maximize coping skills, provide positive support system. Participant is able to verbalize types and ability to use techniques and skills needed for reducing stress and depression.   Initial Review & Psychosocial Screening:   Quality of Life Scores:      Quality of Life - 06/09/17 1426      Quality of Life Scores   Health/Function Pre 20.43 %   Socioeconomic Pre 21 %   Psych/Spiritual Pre 20.57 %   Family Pre 20.5 %   GLOBAL Pre 20.58 %      PHQ-9: Recent Review Flowsheet Data    Depression screen [facility] 2/9 06/09/2017   Decreased Interest 0   Down, Depressed, Hopeless 0   PHQ - 2 Score 0   Altered sleeping 1   Tired, decreased energy 0   Change in appetite 0   Feeling bad or failure about yourself  0   Trouble concentrating 0   Moving slowly or fidgety/restless 0   Suicidal thoughts 0   PHQ-9 Score 1   Difficult doing work/chores Not difficult at all     Interpretation of Total Score  Total Score Depression Severity:  1-4 = Minimal depression, 5-9 = Mild depression, 10-14 = Moderate depression, 15-19 = Moderately severe depression, 20-27 = Severe depression   Psychosocial Evaluation and Intervention:     Psychosocial  Evaluation - 06/22/17 1703      Psychosocial Evaluation & Interventions   Interventions Encouraged to exercise with the program and follow exercise prescription   Comments Counselor met with Ms. Loken Inez Catalina) today for initial psychosocial evaluation.  She is a 76 year old who had a CABGx3 on 7/23.  Kylinn has a strong support system with a spouse of 31 years; a daughter; her in-laws; and active involvement in her local church.  Blaire states that she sleeps well and has a good appetite.  Other than her health issues with Cardiac concerns and diabetes, she has minimal stress in her life.  Chalonda denies a history of depression or anxiety or any current symptoms.  She has goals to "get healthy" and get back to her normal activities.  Viktoriya will be followed by staff throughout the course of this program.     Expected Outcomes Jenaveve will benefit from consistent exercise to achieve  her stated goals.  The educational and psychoeducational components of this program will be helpful in learning to understand and manage her illness in a positive way.  Jolin will be followed by staff.   Continue Psychosocial Services  Follow up required by staff      Psychosocial Re-Evaluation:     Psychosocial Re-Evaluation    [place] Name 07/21/17 1539  Psychosocial Re-Evaluation   Current issues with Current Stress Concerns       Comments Blood sugar controll problems.       Expected Outcomes Stable blood sugars which should help stablize dealing with her heart problems also.        Continue Psychosocial Services  Follow up required by staff         Initial Review   Source of Stress Concerns Chronic Illness          Psychosocial Discharge (Final Psychosocial Re-Evaluation):     Psychosocial Re-Evaluation - 07/21/17 1539      Psychosocial Re-Evaluation   Current issues with Current Stress Concerns   Comments Blood sugar controll problems.   Expected Outcomes Stable blood sugars which should  help stablize dealing with her heart problems also.    Continue Psychosocial Services  Follow up required by staff     Initial Review   Source of Stress Concerns Chronic Illness      Vocational Rehabilitation: Provide vocational rehab assistance to qualifying candidates.   Vocational Rehab Evaluation & Intervention:     Vocational Rehab - 06/09/17 1427      Initial Vocational Rehab Evaluation & Intervention   Assessment shows need for Vocational Rehabilitation No      Education: Education Goals: Education classes will be provided on a variety of topics geared toward better understanding of heart health and risk factor modification. Participant will state understanding/return demonstration of topics presented as noted by education test scores.  Learning Barriers/Preferences:     Learning Barriers/Preferences - 06/09/17 1427      Learning Barriers/Preferences   Learning Barriers None   Learning Preferences Individual Instruction      Education Topics: General Nutrition Guidelines/Fats and Fiber: -Group instruction provided by verbal, written material, models and posters to present the general guidelines for heart healthy nutrition. Gives an explanation and review of dietary fats and fiber.   Controlling Sodium/Reading Food Labels: -Group verbal and written material supporting the discussion of sodium use in heart healthy nutrition. Review and explanation with models, verbal and written materials for utilization of the food label.   Exercise Physiology & Risk Factors: - Group verbal and written instruction with models to review the exercise physiology of the cardiovascular system and associated critical values. Details cardiovascular disease risk factors and the goals associated with each risk factor.   Cardiac Rehab from 07/25/2017 in [facility] Cardiac and Pulmonary Rehab  Date  06/22/17  Educator  Nada Maclachlan, EP  Instruction Review Code  2- Demonstrated Understanding       Aerobic Exercise & Resistance Training: - Gives group verbal and written discussion on the health impact of inactivity. On the components of aerobic and resistive training programs and the benefits of this training and how to safely progress through these programs.   Cardiac Rehab from 07/25/2017 in [facility] Cardiac and Pulmonary Rehab  Date  06/27/17  Educator  [facility]  Instruction Review Code  1- Geologist, engineering, Balance, General Exercise Guidelines: - Provides group verbal and written instruction on the benefits of flexibility and balance training programs. Provides general exercise guidelines with specific guidelines to those with heart or lung disease. Demonstration and skill practice provided.   Cardiac Rehab from 07/25/2017 in [facility] Cardiac and Pulmonary Rehab  Date  06/29/17  Educator  AS  Instruction Review Code  1- Verbalizes Understanding      Stress Management: - Provides group verbal and written instruction about the  health risks of elevated stress, cause of high stress, and healthy ways to reduce stress.   Cardiac Rehab from 07/25/2017 in [facility] Cardiac and Pulmonary Rehab  Date  07/06/17  Educator  [facility]  Instruction Review Code  1- Verbalizes Understanding      Depression: - Provides group verbal and written instruction on the correlation between heart/lung disease and depressed mood, treatment options, and the stigmas associated with seeking treatment.   Anatomy & Physiology of the Heart: - Group verbal and written instruction and models provide basic cardiac anatomy and physiology, with the coronary electrical and arterial systems. Review of: AMI, Angina, Valve disease, Heart Failure, Cardiac Arrhythmia, Pacemakers, and the ICD.   Cardiac Rehab from 07/25/2017 in [facility] Cardiac and Pulmonary Rehab  Date  07/04/17  Educator  [facility]  Instruction Review Code  1- Verbalizes Understanding      Cardiac Procedures: - Group verbal and written instruction to  review commonly prescribed medications for heart disease. Reviews the medication, class of the drug, and side effects. Includes the steps to properly store meds and maintain the prescription regimen. (beta blockers and nitrates)   Cardiac Rehab from 07/25/2017 in [facility] Cardiac and Pulmonary Rehab  Date  07/11/17  Educator  [facility]  Instruction Review Code  1- Verbalizes Understanding      Cardiac Medications I: - Group verbal and written instruction to review commonly prescribed medications for heart disease. Reviews the medication, class of the drug, and side effects. Includes the steps to properly store meds and maintain the prescription regimen.   Cardiac Rehab from 07/25/2017 in [facility] Cardiac and Pulmonary Rehab  Date  07/18/17 [Part 2 07/20/17 [facility]]  Educator  MA  Instruction Review Code  1- Verbalizes Understanding      Cardiac Medications II: -Group verbal and written instruction to review commonly prescribed medications for heart disease. Reviews the medication, class of the drug, and side effects. (all other drug classes)    Go Sex-Intimacy & Heart Disease, Get SMART - Goal Setting: - Group verbal and written instruction through game format to discuss heart disease and the return to sexual intimacy. Provides group verbal and written material to discuss and apply goal setting through the application of the S.M.A.R.T. Method.   Cardiac Rehab from 07/25/2017 in [facility] Cardiac and Pulmonary Rehab  Date  07/11/17  Educator  [facility]  Instruction Review Code  1- Verbalizes Understanding      Other Matters of the Heart: - Provides group verbal, written materials and models to describe Heart Failure, Angina, Valve Disease, Peripheral Artery Disease, and Diabetes in the realm of heart disease. Includes description of the disease process and treatment options available to the cardiac patient.   Cardiac Rehab from 07/25/2017 in [facility] Cardiac and Pulmonary Rehab  Date  07/04/17  Educator  [facility]  Instruction  Review Code  1- Verbalizes Understanding      Exercise & Equipment Safety: - Individual verbal instruction and demonstration of equipment use and safety with use of the equipment.   Cardiac Rehab from 07/25/2017 in [facility] Cardiac and Pulmonary Rehab  Date  06/09/17  Educator  [facility]      Infection Prevention: - Provides verbal and written material to individual with discussion of infection control including proper hand washing and proper equipment cleaning during exercise session.   Cardiac Rehab from 07/25/2017 in [facility] Cardiac and Pulmonary Rehab  Date  06/09/17  Educator  [facility]      Falls Prevention: - Provides verbal and written material to  individual with discussion of falls prevention and safety.   Cardiac Rehab from 07/25/2017 in [facility] Cardiac and Pulmonary Rehab  Date  06/09/17  Educator  [facility]  Instruction Review Code (retired)  2- meets goals/outcomes      Diabetes: - Individual verbal and written instruction to review signs/symptoms of diabetes, desired ranges of glucose level fasting, after meals and with exercise. Acknowledge that pre and post exercise glucose checks will be done for 3 sessions at entry of program.   Cardiac Rehab from 07/25/2017 in [facility] Cardiac and Pulmonary Rehab  Date  06/09/17  Educator  [facility]      Other: -Provides group and verbal instruction on various topics (see comments)    Knowledge Questionnaire Score:     Knowledge Questionnaire Score - 06/09/17 1427      Knowledge Questionnaire Score   Pre Score 17/28   Correct answers reviewed with Inez Catalina.      Core Components/Risk Factors/Patient Goals at Admission:     Personal Goals and Risk Factors at Admission - 06/09/17 1421      Core Components/Risk Factors/Patient Goals on Admission    Weight Management Weight Gain;Yes   Intervention Weight Management: Provide education and appropriate resources to help participant work on and attain dietary goals.;Weight Management: Develop a combined  nutrition and exercise program designed to reach desired caloric intake, while maintaining appropriate intake of nutrient and fiber, sodium and fats, and appropriate energy expenditure required for the weight goal.  Patient states she would like to gain weight in her extremeties because they are "looking too thin"   Admit Weight 182 lb 6.4 oz (82.7 kg)   Goal Weight: Short Term 184 lb (83.5 kg)   Goal Weight: Long Term 186 lb (84.4 kg)  patient unsure, just wants to not look too thin in arms   Expected Outcomes Short Term: Continue to assess and modify interventions until short term weight is achieved;Long Term: Adherence to nutrition and physical activity/exercise program aimed toward attainment of established weight goal;Weight Maintenance: Understanding of the daily nutrition guidelines, which includes 25-35% calories from fat, 7% or less cal from saturated fats, less than '200mg'$  cholesterol, less than 1.5gm of sodium, & 5 or more servings of fruits and vegetables daily;Understanding recommendations for meals to include 15-35% energy as protein, 25-35% energy from fat, 35-60% energy from carbohydrates, less than '200mg'$  of dietary cholesterol, 20-35 gm of total fiber daily;Understanding of distribution of calorie intake throughout the day with the consumption of 4-5 meals/snacks   Diabetes Yes   Intervention Provide education about signs/symptoms and action to take for hypo/hyperglycemia.;Provide education about proper nutrition, including hydration, and aerobic/resistive exercise prescription along with prescribed medications to achieve blood glucose in normal ranges: Fasting glucose 65-99 mg/dL   Expected Outcomes Short Term: Participant verbalizes understanding of the signs/symptoms and immediate care of hyper/hypoglycemia, proper foot care and importance of medication, aerobic/resistive exercise and nutrition plan for blood glucose control.;Long Term: Attainment of HbA1C < 7%.   Heart Failure Yes    Intervention Provide a combined exercise and nutrition program that is supplemented with education, support and counseling about heart failure. Directed toward relieving symptoms such as shortness of breath, decreased exercise tolerance, and extremity edema.   Expected Outcomes Improve functional capacity of life;Short term: Attendance in program 2-3 days a week with increased exercise capacity. Reported lower sodium intake. Reported increased fruit and vegetable intake. Reports medication compliance.;Short term: Daily weights obtained and reported for increase. Utilizing diuretic protocols set by physician.;Long term: Adoption  of self-care skills and reduction of barriers for early signs and symptoms recognition and intervention leading to self-care maintenance.      Core Components/Risk Factors/Patient Goals Review:      Goals and Risk Factor Review    Row Name 07/13/17 1656             Core Components/Risk Factors/Patient Goals Review   Personal Goals Review Hypertension;Diabetes;Heart Failure       Review Danajah weighs in daily.  She also measures BP and BG daily.  All numbers have been good so far.         Expected Outcomes Short - Sarajane will maintain her healthy habits and good numbers.  Long - Krystiana will maintain control of BP and BG.           Core Components/Risk Factors/Patient Goals at Discharge (Final Review):      Goals and Risk Factor Review - 07/13/17 1656      Core Components/Risk Factors/Patient Goals Review   Personal Goals Review Hypertension;Diabetes;Heart Failure   Review Alaysiah weighs in daily.  She also measures BP and BG daily.  All numbers have been good so far.     Expected Outcomes Short - Graycen will maintain her healthy habits and good numbers.  Long - Ghazal will maintain control of BP and BG.       ITP Comments:     ITP Comments    Row Name 06/09/17 1416 06/23/17 1646 06/29/17 1142 07/21/17 1539 07/27/17 0833   ITP Comments Med Review Completed.  Initial ITP created. Diagnosis can be found in [facility] 05/13/17 Agustina felt a little shakey on the treadmill. Blood sugar checked at 117, was given peanut butter crackers and continued to exercise on a seated piece of equipment. 30 day review. Continue with ITP unless directed changes per Medical Director review.   Ova called and said her blood sugar dropped at home today so she doesn't feel comfortable coming to Cardiac Rehab this evening and driving herself.  30 day review. Continue with ITP unless directed changes per Medical Director Review.       Comments:

## 2017-07-28 ENCOUNTER — Encounter: Payer: Self-pay | Admitting: Dietician

## 2017-07-28 ENCOUNTER — Telehealth: Payer: Self-pay | Admitting: *Deleted

## 2017-07-28 NOTE — Telephone Encounter (Signed)
I called Carrie Kane and she may attend Cardiac Rehab but will check weather.

## 2017-08-01 ENCOUNTER — Encounter: Payer: Medicare Other | Admitting: *Deleted

## 2017-08-01 DIAGNOSIS — Z951 Presence of aortocoronary bypass graft: Secondary | ICD-10-CM

## 2017-08-01 NOTE — Progress Notes (Signed)
Cardiology Office Note  Date:  08/02/2017   ID:  Carrie Kane, DOB 08-01-1941, MRN [id number]  PCP:  Carrie Athens, MD   Chief Complaint  Patient presents with  . other    2 month follow up. Patient denies chest pain and SOB. Meds reviewed verbally with patient.     HPI:  [name] is a pleasant 76 year old  with past medical history of Diabetes, insulin-dependent Hypertension Worsening chest pain consistent with unstable angina Cardiac catheterization showing severe left main disease, three-vessel disease Coronary artery bypass grafting x307/23/2018 (left internal mammary artery to left anterior descending artery, saphenous vein graft to diagonal, saphenous vein graft to the obtuse marginal). Who presents for follow-up of her coronary disease, s/p CABG  Doing cardiac rehab, feels well denies any significant shortness of breath or on exertion PreviousLabs reviewed HBA1C 8.4 Total chol 155 LDL 81  She has changed her diet, doing low carbohydrates She is concerned about high aspirin dose, risk of prior bleeding to her eyes  EKG personally reviewed by myself on todays visit Shows normal sinus rhythm rate 61 bpm left anterior fascicular block ST abnormality 1 and aVL  Previous cardiac catheterization results discussed with her  LM lesion, 80 %stenosed.  Ost Cx to Prox Cx lesion, 95 %stenosed.  Mid Cx lesion, 70 %stenosed.  Mid LAD lesion, 30 %stenosed.  Prox LAD lesion, 80 %stenosed.  There is mild left ventricular systolic dysfunction.  LV end diastolic pressure is moderately elevated.  The left ventricular ejection fraction is 45-50% by visual estimate.   1. Severe heavily calcified calcified left main stenosis extending into the ostium of the left circumflex with significant proximal LAD stenosis. Diffuse diabetic branch disease. 2. Mildly reduced LV systolic function with an EF of 45-50% with moderate mid to distal anterior and apical hypokinesis. 3. Severely  elevated systemic hypertension with moderately elevated left ventricular end-diastolic pressure.  Echocardiogram done through primary care showing ejection fraction 45%, anteroseptal wall dyskinesia normal left atrial size, calcified mitral valve, diastolic dysfunction    PMH:   has a past medical history of CAD (coronary artery disease); Diastolic dysfunction; Hyperlipidemia; Hypertensive heart disease; and Insulin dependent diabetes mellitus (Carrie Kane).  PSH:    Past Surgical History:  Procedure Laterality Date  . ABDOMINAL HYSTERECTOMY    . CORONARY ARTERY BYPASS GRAFT N/A 05/09/2017   Procedure: CORONARY ARTERY BYPASS GRAFTING (CABG) x 3 using left internal mammary artery and right greater saphenous vein harvested endoscopically;  Surgeon: Ivin Poot, MD;  Location: [place];  Service: Open Heart Surgery;  Laterality: N/A;  . INTRAOPERATIVE TRANSESOPHAGEAL ECHOCARDIOGRAM N/A 05/09/2017   Procedure: INTRAOPERATIVE TRANSESOPHAGEAL ECHOCARDIOGRAM;  Surgeon: Ivin Poot, MD;  Location: [place];  Service: Open Heart Surgery;  Laterality: N/A;  . LEFT HEART CATH AND CORONARY ANGIOGRAPHY N/A 05/02/2017   Procedure: Left Heart Cath and Coronary Angiography;  Surgeon: Wellington Hampshire, MD;  Location: [place] [facility];  Service: Cardiovascular;  Laterality: N/A;    Current Outpatient Prescriptions  Medication Sig Dispense Refill  . alum & mag hydroxide-simeth (MAALOX/MYLANTA) 200-200-20 MG/5ML suspension Take 15 mLs by mouth every 6 (six) hours as needed for indigestion or heartburn.    Marland Kitchen aspirin 325 MG EC tablet Take 1 tablet (325 mg total) by mouth daily. 30 tablet 0  . atorvastatin (LIPITOR) 40 MG tablet Take 1 tablet (40 mg total) by mouth daily at 6 PM. 90 tablet 1  . benazepril-hydrochlorthiazide (LOTENSIN HCT) 20-12.5 MG per tablet Take 1 tablet by  mouth 2 (two) times daily.     . Chlorpheniramine Maleate (ALLERGY PO) Take 1 tablet by mouth daily as needed (allergies).    .  cholecalciferol (VITAMIN D) 1000 units tablet Take 2,000 Units by mouth daily at 12 noon.     . cloNIDine (CATAPRES) 0.2 MG tablet Take 1 tablet (0.2 mg total) by mouth 2 (two) times daily. 90 tablet 1  . insulin glargine (LANTUS) 100 UNIT/ML injection Inject 45 Units into the skin at bedtime.    . insulin lispro (HUMALOG) 100 UNIT/ML injection Inject 12-14 Units into the skin 2 (two) times daily. 12 units with lunch and 14 units with dinner    . insulin lispro protamine-lispro (HUMALOG 75/25 MIX) (75-25) 100 UNIT/ML SUSP injection Inject 19-38 Units into the skin daily with breakfast.    . levothyroxine (SYNTHROID, LEVOTHROID) 100 MCG tablet Take 100 mcg by mouth daily before breakfast.    . metoprolol tartrate (LOPRESSOR) 25 MG tablet Take 1 tablet (25 mg total) by mouth 2 (two) times daily. 180 tablet 0  . Multiple Vitamin (MULTIVITAMIN) capsule Take 1 capsule by mouth daily at 12 noon.     . nitroGLYCERIN (NITROSTAT) 0.4 MG SL tablet Place 1 tablet (0.4 mg total) under the tongue every 5 (five) minutes as needed for chest pain. 30 tablet 12  . Polyethyl Glycol-Propyl Glycol (SYSTANE OP) Apply 1 drop to eye as needed (dry eyes).     No current facility-administered medications for this visit.      Allergies:   Flexeril [cyclobenzaprine]   Social History:  The patient  reports that she has never smoked. She has never used smokeless tobacco. She reports that she does not drink alcohol or use drugs.   Family History:   family history includes Breast cancer in her maternal aunt.    Review of Systems: Review of Systems  Constitutional: Negative.   Respiratory: Negative.   Cardiovascular: Negative.   Gastrointestinal: Negative.   Musculoskeletal: Negative.   Neurological: Negative.   Psychiatric/Behavioral: Negative.   All other systems reviewed and are negative.    PHYSICAL EXAM: VS:  BP 138/60 (BP Location: Left Arm, Patient Position: Sitting, Cuff Size: Normal)   Pulse 61   Ht  5\' 8"  (1.727 m)   Wt 179 lb (81.2 kg)   BMI 27.22 kg/m  , BMI Body mass index is 27.22 kg/m. GEN: Well nourished, well developed, in no acute distress  HEENT: normal  Neck: no JVD, + carotid bruits on the left, no masses Cardiac: RRR; no murmurs, rubs, or gallops,no edema  Respiratory:  clear to auscultation bilaterally, normal work of breathing GI: soft, nontender, nondistended, + BS MS: no deformity or atrophy , Well healed mediastinal scar Skin: warm and dry, no rash Neuro:  Strength and sensation are intact Psych: euthymic mood, full affect    Recent Labs: 05/03/2017: TSH 0.372 05/10/2017: Magnesium 2.3 05/13/2017: Hemoglobin 10.3; Platelets 198 06/03/2017: BUN 21; Creatinine, Ser 1.07; Potassium 4.3; Sodium 138    Lipid Panel Lab Results  Component Value Date   CHOL 155 05/03/2017   HDL 55 05/03/2017   LDLCALC 81 05/03/2017   TRIG 93 05/03/2017      Wt Readings from Last 3 Encounters:  08/02/17 179 lb (81.2 kg)  06/15/17 184 lb (83.5 kg)  06/09/17 182 lb 6.4 oz (82.7 kg)       ASSESSMENT AND PLAN:  CAD, s/p CABG, Stable angina decrease aspirin dosing, start low-dose Plavix given left main disease, severe three-vessel disease  in a diabetic  Bruit - Ultrasound 05/05/2017 less than 39% bilateral disease  Diabetes mellitus type 2, insulin dependent ([place]) We have encouraged continued exercise, careful diet management in an effort to lose weight.  Systolic dysfunction Ejection fraction 45% with anteroseptal wall dyskinesia based on outside echocardiogram Now status post CABG  Hyperlipidemia On Lipitor 40 mg daily  PAD Lower extremity arterial Doppler showing moderate reduction arterial flow at rest on the right, noncompressible vessels on the left    Total encounter time more than 25 minutes  Greater than 50% was spent in counseling and coordination of care with the patient   Disposition:   F/U  6 months   Orders Placed This Encounter  Procedures   . EKG 12-Lead     Signed, Esmond Plants, M.D., Ph.D. 08/02/2017  [place], [place]

## 2017-08-01 NOTE — Progress Notes (Signed)
Daily Session Note  Patient Details  Name: Carrie Kane MRN: [id number] Date of Birth: [birth year] Referring Provider:     Cardiac Rehab from 06/09/2017 in [facility] Cardiac and Pulmonary Rehab  Referring Provider  Arida      Encounter Date: 08/01/2017  Check In:     Session Check In - 08/01/17 1642      Check-In   Location [facility]-Cardiac & Pulmonary Rehab   Staff Present Nada Maclachlan, BA, ACSM CEP, Exercise Physiologist;Vienne Corcoran Amedeo Plenty, BS, ACSM CEP, Exercise Physiologist;Meredith Sherryll Burger, RN BSN   Supervising physician immediately available to respond to emergencies See telemetry face sheet for immediately available ER MD   Medication changes reported     No   Fall or balance concerns reported    No   Warm-up and Cool-down Performed on first and last piece of equipment   Resistance Training Performed Yes   VAD Patient? No     VAD patient   Has back up controller? No     Pain Assessment   Currently in Pain? No/denies   Multiple Pain Sites No         History  Smoking Status  . Never Smoker  Smokeless Tobacco  . Never Used    Goals Met:  Independence with exercise equipment Exercise tolerated well No report of cardiac concerns or symptoms Strength training completed today  Goals Unmet:  Not Applicable  Comments: Pt able to follow exercise prescription today without complaint.  Will continue to monitor for progression.    Dr. Emily Filbert is Medical Director for [place] and [facility].

## 2017-08-02 ENCOUNTER — Ambulatory Visit (INDEPENDENT_AMBULATORY_CARE_PROVIDER_SITE_OTHER): Payer: Medicare Other | Admitting: Cardiovascular Disease

## 2017-08-02 ENCOUNTER — Encounter: Payer: Self-pay | Admitting: Cardiovascular Disease

## 2017-08-02 VITALS — BP 138/60 | HR 61 | Ht 68.0 in | Wt 179.0 lb

## 2017-08-02 DIAGNOSIS — I200 Unstable angina: Secondary | ICD-10-CM | POA: Diagnosis not present

## 2017-08-02 DIAGNOSIS — Z794 Long term (current) use of insulin: Secondary | ICD-10-CM | POA: Diagnosis not present

## 2017-08-02 DIAGNOSIS — E119 Type 2 diabetes mellitus without complications: Secondary | ICD-10-CM

## 2017-08-02 DIAGNOSIS — E782 Mixed hyperlipidemia: Secondary | ICD-10-CM

## 2017-08-02 DIAGNOSIS — Z951 Presence of aortocoronary bypass graft: Secondary | ICD-10-CM

## 2017-08-02 DIAGNOSIS — I25118 Atherosclerotic heart disease of native coronary artery with other forms of angina pectoris: Secondary | ICD-10-CM | POA: Diagnosis not present

## 2017-08-02 DIAGNOSIS — I739 Peripheral vascular disease, unspecified: Secondary | ICD-10-CM | POA: Insufficient documentation

## 2017-08-02 MED ORDER — CLOPIDOGREL BISULFATE 75 MG PO TABS: 75.0000 mg | ORAL_TABLET | Freq: Every day | ORAL | 3 refills | Status: DC

## 2017-08-02 NOTE — Patient Instructions (Signed)
Medication Instructions:   Decrease the aspirin down to 81 mg daily Start plavix 75 mg once a day  Labwork:  No new labs needed  Testing/Procedures:  No further testing at this time   Follow-Up: It was a pleasure seeing you in the office today. Please call us if you have new issues that need to be addressed before your next appt.  [phone]  Your physician wants you to follow-up in: 6 months.  You will receive a reminder letter in the mail two months in advance. If you don't receive a letter, please call our office to schedule the follow-up appointment.  If you need a refill on your cardiac medications before your next appointment, please call your pharmacy.

## 2017-08-03 DIAGNOSIS — Z951 Presence of aortocoronary bypass graft: Secondary | ICD-10-CM | POA: Diagnosis not present

## 2017-08-03 NOTE — Progress Notes (Signed)
Daily Session Note  Patient Details  Name: Carrie Kane MRN: [id number] Date of Birth: 24-Mar-1941 Referring Provider:     Cardiac Rehab from 06/09/2017 in [facility] Cardiac and Pulmonary Rehab  Referring Provider  Arida      Encounter Date: 08/03/2017  Check In:     Session Check In - 08/03/17 1637      Check-In   Location [facility]-Cardiac & Pulmonary Rehab   Staff Present Gerlene Burdock, RN, Vickki Hearing, BA, ACSM CEP, Exercise Physiologist;Other  [place] physician immediately available to respond to emergencies See telemetry face sheet for immediately available ER MD   Medication changes reported     No   Fall or balance concerns reported    No   Warm-up and Cool-down Performed on first and last piece of equipment   Resistance Training Performed Yes   VAD Patient? No     VAD patient   Has back up controller? No         History  Smoking Status  . Never Smoker  Smokeless Tobacco  . Never Used    Goals Met:  Independence with exercise equipment Exercise tolerated well No report of cardiac concerns or symptoms Strength training completed today  Goals Unmet:  Not Applicable  Comments: Pt able to follow exercise prescription today without complaint.  Will continue to monitor for progression.    Dr. Emily Filbert is Medical Director for [place] and [facility].

## 2017-08-04 DIAGNOSIS — Z951 Presence of aortocoronary bypass graft: Secondary | ICD-10-CM

## 2017-08-04 NOTE — Progress Notes (Signed)
Daily Session Note  Patient Details  Name: Siani Utke MRN: [id number] Date of Birth: August 11, 1941 Referring Provider:     Cardiac Rehab from 06/09/2017 in [facility] Cardiac and Pulmonary Rehab  Referring Provider  Arida      Encounter Date: 08/04/2017  Check In:     Session Check In - 08/04/17 1635      Check-In   Location [facility]-Cardiac & Pulmonary Rehab   Staff Present Gerlene Burdock, RN, Moises Blood, BS, ACSM CEP, Exercise Physiologist;Amare Kontos Flavia Shipper   Supervising physician immediately available to respond to emergencies See telemetry face sheet for immediately available ER MD   Medication changes reported     No   Fall or balance concerns reported    No   Warm-up and Cool-down Performed on first and last piece of equipment   Resistance Training Performed Yes   VAD Patient? No     VAD patient   Has back up controller? No     Pain Assessment   Currently in Pain? No/denies   Multiple Pain Sites No         History  Smoking Status  . Never Smoker  Smokeless Tobacco  . Never Used    Goals Met:  Independence with exercise equipment Exercise tolerated well No report of cardiac concerns or symptoms Strength training completed today  Personal goals reviewed  Goals Unmet:  Not Applicable  Comments: Pt able to follow exercise prescription today without complaint.  Will continue to monitor for progression.   Dr. Emily Filbert is Medical Director for [place] and [facility].

## 2017-08-08 DIAGNOSIS — Z951 Presence of aortocoronary bypass graft: Secondary | ICD-10-CM

## 2017-08-08 NOTE — Progress Notes (Signed)
Daily Session Note  Patient Details  Name: Carrie Kane MRN: [id number] Date of Birth: [birth year] Referring Provider:     Cardiac Rehab from 06/09/2017 in [facility] Cardiac and Pulmonary Rehab  Referring Provider  Arida      Encounter Date: 08/08/2017  Check In:     Session Check In - 08/08/17 1645      Check-In   Location [facility]-Cardiac & Pulmonary Rehab   Staff Present Nada Maclachlan, BA, ACSM CEP, Exercise Physiologist;Kelly Amedeo Plenty, BS, ACSM CEP, Exercise Physiologist;Meredith Sherryll Burger, RN BSN   Supervising physician immediately available to respond to emergencies See telemetry face sheet for immediately available ER MD   Medication changes reported     No   Fall or balance concerns reported    No   Warm-up and Cool-down Performed on first and last piece of equipment   Resistance Training Performed Yes   VAD Patient? No     VAD patient   Has back up controller? No     Pain Assessment   Currently in Pain? No/denies         History  Smoking Status  . Never Smoker  Smokeless Tobacco  . Never Used    Goals Met:  Independence with exercise equipment Exercise tolerated well No report of cardiac concerns or symptoms Strength training completed today  Goals Unmet:  Not Applicable  Comments: Pt able to follow exercise prescription today without complaint.  Will continue to monitor for progression.    Dr. Emily Filbert is Medical Director for [place] and [facility].

## 2017-08-10 ENCOUNTER — Encounter: Payer: Medicare Other | Admitting: *Deleted

## 2017-08-10 DIAGNOSIS — Z951 Presence of aortocoronary bypass graft: Secondary | ICD-10-CM

## 2017-08-10 NOTE — Progress Notes (Signed)
Daily Session Note  Patient Details  Name: Carrie Kane MRN: [id number] Date of Birth: 04-26-1941 Referring Provider:     Cardiac Rehab from 06/09/2017 in [facility] Cardiac and Pulmonary Rehab  Referring Provider  Arida      Encounter Date: 08/10/2017  Check In:     Session Check In - 08/10/17 1633      Check-In   Location [facility]-Cardiac & Pulmonary Rehab   Staff Present Renita Papa, RN Vickki Hearing, BA, ACSM CEP, Exercise Physiologist;Carroll Enterkin, RN, BSN   Supervising physician immediately available to respond to emergencies See telemetry face sheet for immediately available ER MD   Medication changes reported     No   Fall or balance concerns reported    No   Warm-up and Cool-down Performed on first and last piece of equipment   Resistance Training Performed Yes   VAD Patient? No     VAD patient   Has back up controller? No     Pain Assessment   Currently in Pain? No/denies         History  Smoking Status  . Never Smoker  Smokeless Tobacco  . Never Used    Goals Met:  Independence with exercise equipment Exercise tolerated well No report of cardiac concerns or symptoms Strength training completed today  Goals Unmet:  Not Applicable  Comments: Pt able to follow exercise prescription today without complaint.  Will continue to monitor for progression.    Dr. Emily Filbert is Medical Director for [place] and [facility].

## 2017-08-11 ENCOUNTER — Encounter: Payer: Medicare Other | Admitting: *Deleted

## 2017-08-11 VITALS — Ht 69.0 in | Wt 180.3 lb

## 2017-08-11 DIAGNOSIS — Z951 Presence of aortocoronary bypass graft: Secondary | ICD-10-CM | POA: Diagnosis not present

## 2017-08-11 NOTE — Progress Notes (Signed)
Daily Session Note  Patient Details  Name: Carrie Kane MRN: [id number] Date of Birth: [birth year] Referring Provider:     Cardiac Rehab from 06/09/2017 in [facility] Cardiac and Pulmonary Rehab  Referring Provider  Arida      Encounter Date: 08/11/2017  Check In:     Session Check In - 08/11/17 1704      Check-In   Location [facility]-Cardiac & Pulmonary Rehab   Staff Present Earlean Shawl, BS, ACSM CEP, Exercise Physiologist;Joseph Tessie Fass RCP,RRT,BSRT;Carroll Enterkin, RN, BSN   Supervising physician immediately available to respond to emergencies See telemetry face sheet for immediately available ER MD   Medication changes reported     No   Fall or balance concerns reported    No   Warm-up and Cool-down Performed on first and last piece of equipment   Resistance Training Performed Yes   VAD Patient? No     Pain Assessment   Currently in Pain? No/denies   Multiple Pain Sites No         History  Smoking Status  . Never Smoker  Smokeless Tobacco  . Never Used    Goals Met:  Independence with exercise equipment Exercise tolerated well Personal goals reviewed No report of cardiac concerns or symptoms Strength training completed today  Goals Unmet:  Not Applicable  Comments: Pt able to follow exercise prescription today without complaint.  Will continue to monitor for progression.     [place] Name 06/09/17 1506 08/11/17 1705       6 Minute Walk   Phase Initial Discharge    Distance 900 feet 1186 feet    Distance % Change  - 32 %    Distance Feet Change  - 286 ft    Walk Time 6 minutes 6 minutes    # of Rest Breaks 0 0    MPH 1.7 2.25    METS 2 2.6    RPE 15 9    VO2 Peak 7.03 9.11    Symptoms No No    Resting HR 68 bpm 73 bpm    Resting BP 130/58 130/58    Resting Oxygen Saturation   - 100 %    Exercise Oxygen Saturation  during 6 min walk  - 100 %    Max Ex. HR 95 bpm 88 bpm    Max Ex. BP 132/48 154/72    2 Minute Post BP 128/50  -          Dr. Emily Filbert is Medical Director for [place] and [facility].

## 2017-08-15 DIAGNOSIS — Z951 Presence of aortocoronary bypass graft: Secondary | ICD-10-CM

## 2017-08-15 NOTE — Progress Notes (Signed)
Daily Session Note  Patient Details  Name: Jazari Ober MRN: [id number] Date of Birth: [birth year] Referring Provider:     Cardiac Rehab from 06/09/2017 in [facility] Cardiac and Pulmonary Rehab  Referring Provider  Arida      Encounter Date: 08/15/2017  Check In:     Session Check In - 08/15/17 1729      Check-In   Location [facility]-Cardiac & Pulmonary Rehab   Staff Present Nada Maclachlan, BA, ACSM CEP, Exercise Physiologist;Kelly Amedeo Plenty, BS, ACSM CEP, Exercise Physiologist;Meredith Sherryll Burger, RN BSN   Supervising physician immediately available to respond to emergencies See telemetry face sheet for immediately available ER MD   Medication changes reported     No   Fall or balance concerns reported    No   Warm-up and Cool-down Performed on first and last piece of equipment   Resistance Training Performed Yes   VAD Patient? No     VAD patient   Has back up controller? No     Pain Assessment   Currently in Pain? No/denies   Multiple Pain Sites No         History  Smoking Status  . Never Smoker  Smokeless Tobacco  . Never Used    Goals Met:  Independence with exercise equipment Exercise tolerated well No report of cardiac concerns or symptoms Strength training completed today  Goals Unmet:  Not Applicable  Comments: Pt able to follow exercise prescription today without complaint.  Will continue to monitor for progression.    Dr. Emily Filbert is Medical Director for [place] and [facility].

## 2017-08-16 NOTE — Patient Instructions (Signed)
Discharge Instructions  Patient Details  Name: Carrie Kane MRN: [id number] Date of Birth: [birth year] Referring Provider:  Wellington Hampshire, MD   Number of Visits: 11  Reason for Discharge:  Patient reached a stable level of exercise. Patient independent in their exercise. Patient has met program and personal goals.  Smoking History:  History  Smoking Status  . Never Smoker  Smokeless Tobacco  . Never Used    Diagnosis:  S/P CABG x 3  Initial Exercise Prescription:     Initial Exercise Prescription - 06/09/17 1500      Date of Initial Exercise RX and Referring Provider   Date 06/09/17   Referring Provider Arida     Treadmill   MPH 1.5   Grade 0   Minutes 15   METs 2     NuStep   Level 2   SPM 60   Minutes 15   METs 2     Biostep-RELP   Level 2   SPM 50   Minutes 15   METs 2     Track   Laps 25   Minutes 15   METs 2     Prescription Details   Frequency (times per week) 3   Duration Progress to 30 minutes of continuous aerobic without signs/symptoms of physical distress     Intensity   THRR 40-80% of Max Heartrate 99-130   Ratings of Perceived Exertion 11-13   Perceived Dyspnea 0-4     Resistance Training   Training Prescription Yes   Weight 2   Reps 10-15      Discharge Exercise Prescription (Final Exercise Prescription Changes):     Exercise Prescription Changes - 08/05/17 1200      Response to Exercise   Blood Pressure (Admit) 126/58   Blood Pressure (Exercise) 130/67   Blood Pressure (Exit) 124/82   Heart Rate (Admit) 73 bpm   Heart Rate (Exercise) 100 bpm   Heart Rate (Exit) 66 bpm   Rating of Perceived Exertion (Exercise) 13   Duration Continue with 45 min of aerobic exercise without signs/symptoms of physical distress.   Intensity THRR unchanged     Progression   Progression Continue to progress workloads to maintain intensity without signs/symptoms of physical distress.   Average METs 2.7     Resistance Training    Training Prescription Yes   Weight 2   Reps 10-15     Interval Training   Interval Training No     Treadmill   MPH 2   Grade 2   Minutes 15   METs 3     Biostep-RELP   Level 2   SPM 50   Minutes 15   METs 2     Home Exercise Plan   Plans to continue exercise at Home (comment)  add 1-2 days of walking and stretching at home    Frequency Add 2 additional days to program exercise sessions.   Initial Home Exercises Provided 08/04/17      Functional Capacity:     6 Minute Walk    Row Name 06/09/17 1506 08/11/17 1705       6 Minute Walk   Phase Initial Discharge    Distance 900 feet 1186 feet    Distance % Change  - 32 %    Distance Feet Change  - 286 ft    Walk Time 6 minutes 6 minutes    # of Rest Breaks 0 0    MPH 1.7 2.25  METS 2 2.6    RPE 15 9    VO2 Peak 7.03 9.11    Symptoms No No    Resting HR 68 bpm 73 bpm    Resting BP 130/58 130/58    Resting Oxygen Saturation   - 100 %    Exercise Oxygen Saturation  during 6 min walk  - 100 %    Max Ex. HR 95 bpm 88 bpm    Max Ex. BP 132/48 154/72    2 Minute Post BP 128/50  -       Quality of Life:     Quality of Life - 06/09/17 1426      Quality of Life Scores   Health/Function Pre 20.43 %   Socioeconomic Pre 21 %   Psych/Spiritual Pre 20.57 %   Family Pre 20.5 %   GLOBAL Pre 20.58 %      Personal Goals: Goals established at orientation with interventions provided to work toward goal.     Personal Goals and Risk Factors at Admission - 06/09/17 1421      Core Components/Risk Factors/Patient Goals on Admission    Weight Management Weight Gain;Yes   Intervention Weight Management: Provide education and appropriate resources to help participant work on and attain dietary goals.;Weight Management: Develop a combined nutrition and exercise program designed to reach desired caloric intake, while maintaining appropriate intake of nutrient and fiber, sodium and fats, and appropriate energy expenditure  required for the weight goal.  Patient states she would like to gain weight in her extremeties because they are "looking too thin"   Admit Weight 182 lb 6.4 oz (82.7 kg)   Goal Weight: Short Term 184 lb (83.5 kg)   Goal Weight: Long Term 186 lb (84.4 kg)  patient unsure, just wants to not look too thin in arms   Expected Outcomes Short Term: Continue to assess and modify interventions until short term weight is achieved;Long Term: Adherence to nutrition and physical activity/exercise program aimed toward attainment of established weight goal;Weight Maintenance: Understanding of the daily nutrition guidelines, which includes 25-35% calories from fat, 7% or less cal from saturated fats, less than 276m cholesterol, less than 1.5gm of sodium, & 5 or more servings of fruits and vegetables daily;Understanding recommendations for meals to include 15-35% energy as protein, 25-35% energy from fat, 35-60% energy from carbohydrates, less than 202mof dietary cholesterol, 20-35 gm of total fiber daily;Understanding of distribution of calorie intake throughout the day with the consumption of 4-5 meals/snacks   Diabetes Yes   Intervention Provide education about signs/symptoms and action to take for hypo/hyperglycemia.;Provide education about proper nutrition, including hydration, and aerobic/resistive exercise prescription along with prescribed medications to achieve blood glucose in normal ranges: Fasting glucose 65-99 mg/dL   Expected Outcomes Short Term: Participant verbalizes understanding of the signs/symptoms and immediate care of hyper/hypoglycemia, proper foot care and importance of medication, aerobic/resistive exercise and nutrition plan for blood glucose control.;Long Term: Attainment of HbA1C < 7%.   Heart Failure Yes   Intervention Provide a combined exercise and nutrition program that is supplemented with education, support and counseling about heart failure. Directed toward relieving symptoms such as  shortness of breath, decreased exercise tolerance, and extremity edema.   Expected Outcomes Improve functional capacity of life;Short term: Attendance in program 2-3 days a week with increased exercise capacity. Reported lower sodium intake. Reported increased fruit and vegetable intake. Reports medication compliance.;Short term: Daily weights obtained and reported for increase. Utilizing diuretic protocols set by physician.;Long  term: Adoption of self-care skills and reduction of barriers for early signs and symptoms recognition and intervention leading to self-care maintenance.       Personal Goals Discharge:     Goals and Risk Factor Review - 08/04/17 1118      Core Components/Risk Factors/Patient Goals Review   Personal Goals Review Other;Hypertension;Diabetes;Lipids   Review Carrie Kane saw Dr Rockey Situ 08/02/17.  She stated all was good - she is on a smaller does of aspirin.  She walks every day when not attending HT.   Expected Outcomes Short - Carrie Kane will continue to exercise and maintain healthy habits.  Long - Carrie Kane will manage her DM and heart condition long term.      Exercise Goals and Review:     Exercise Goals    Row Name 06/09/17 1506             Exercise Goals   Increase Physical Activity Yes       Intervention Provide advice, education, support and counseling about physical activity/exercise needs.;Develop an individualized exercise prescription for aerobic and resistive training based on initial evaluation findings, risk stratification, comorbidities and participant's personal goals.       Expected Outcomes Achievement of increased cardiorespiratory fitness and enhanced flexibility, muscular endurance and strength shown through measurements of functional capacity and personal statement of participant.       Increase Strength and Stamina Yes       Intervention Provide advice, education, support and counseling about physical activity/exercise needs.;Develop an individualized  exercise prescription for aerobic and resistive training based on initial evaluation findings, risk stratification, comorbidities and participant's personal goals.       Expected Outcomes Achievement of increased cardiorespiratory fitness and enhanced flexibility, muscular endurance and strength shown through measurements of functional capacity and personal statement of participant.          Nutrition & Weight - Outcomes:     Pre Biometrics - 06/09/17 1505      Pre Biometrics   Height _0  (1.753 m)   Weight 182 lb 6.4 oz (82.7 kg)   Waist Circumference 41.25 inches   Hip Circumference 38.5 inches   Waist to Hip Ratio 1.07 %   BMI (Calculated) 26.92         Post Biometrics - 08/11/17 1707       Post  Biometrics   Height _1  (1.753 m)   Weight 180 lb 4.8 oz (81.8 kg)   Waist Circumference 40.5 inches   Hip Circumference 40.5 inches   Waist to Hip Ratio 1 %   BMI (Calculated) 26.61      Nutrition:     Nutrition Therapy & Goals - 07/28/17 1138      Nutrition Therapy   RD appointment defered Yes     Personal Nutrition Goals   Comments Carrie Kane states that she has had an RD visit once before, has her resources, and is following a healthy diet so does not need RD assistance at this time.      Nutrition Discharge:   Education Questionnaire Score:     Knowledge Questionnaire Score - 06/09/17 1427      Knowledge Questionnaire Score   Pre Score 17/28   Correct answers reviewed with Carrie Kane.      Goals reviewed with patient; copy given to patient.

## 2017-08-17 ENCOUNTER — Encounter: Payer: Medicare Other | Admitting: *Deleted

## 2017-08-17 DIAGNOSIS — Z951 Presence of aortocoronary bypass graft: Secondary | ICD-10-CM | POA: Diagnosis not present

## 2017-08-17 NOTE — Progress Notes (Signed)
Discharge Progress Report  Patient Details  Name: Carrie Kane MRN: [id number] Date of Birth: 1941-05-08 Referring Provider:     Cardiac Rehab from 06/09/2017 in [facility]  Referring Provider  [place]       Number of Visits: 97  Reason for Discharge:  Patient reached a stable level of exercise. Patient independent in their exercise. Patient has met program and personal goals.  Smoking History:  History  Smoking Status  . Never Smoker  Smokeless Tobacco  . Never Used    Diagnosis:  S/P CABG x 3  ADL UCSD:   Initial Exercise Prescription:     Initial Exercise Prescription - 06/09/17 1500      Date of Initial Exercise RX and Referring Provider   Date 06/09/17   Referring Provider Arida     Treadmill   MPH 1.5   Grade 0   Minutes 15   METs 2     NuStep   Level 2   SPM 60   Minutes 15   METs 2     Biostep-RELP   Level 2   SPM 50   Minutes 15   METs 2     Track   Laps 25   Minutes 15   METs 2     Prescription Details   Frequency (times per week) 3   Duration Progress to 30 minutes of continuous aerobic without signs/symptoms of physical distress     Intensity   THRR 40-80% of Max Heartrate 99-130   Ratings of Perceived Exertion 11-13   Perceived Dyspnea 0-4     Resistance Training   Training Prescription Yes   Weight 2   Reps 10-15      Discharge Exercise Prescription (Final Exercise Prescription Changes):     Exercise Prescription Changes - 08/05/17 1200      Response to Exercise   Blood Pressure (Admit) 126/58   Blood Pressure (Exercise) 130/67   Blood Pressure (Exit) 124/82   Heart Rate (Admit) 73 bpm   Heart Rate (Exercise) 100 bpm   Heart Rate (Exit) 66 bpm   Rating of Perceived Exertion (Exercise) 13   Duration Continue with 45 min of aerobic exercise without signs/symptoms of physical distress.   Intensity THRR unchanged     Progression   Progression Continue to progress workloads to maintain  intensity without signs/symptoms of physical distress.   Average METs 2.7     Resistance Training   Training Prescription Yes   Weight 2   Reps 10-15     Interval Training   Interval Training No     Treadmill   MPH 2   Grade 2   Minutes 15   METs 3     Biostep-RELP   Level 2   SPM 50   Minutes 15   METs 2     Home Exercise Plan   Plans to continue exercise at Home (comment)  add 1-2 days of walking and stretching at home    Frequency Add 2 additional days to program exercise sessions.   Initial Home Exercises Provided 08/04/17      Functional Capacity:     6 Minute Walk    Row Name 06/09/17 1506 08/11/17 1705       6 Minute Walk   Phase Initial Discharge    Distance 900 feet 1186 feet    Distance % Change  - 32 %    Distance Feet Change  - 286 ft    Walk  Time 6 minutes 6 minutes    # of Rest Breaks 0 0    MPH 1.7 2.25    METS 2 2.6    RPE 15 9    VO2 Peak 7.03 9.11    Symptoms No No    Resting HR 68 bpm 73 bpm    Resting BP 130/58 130/58    Resting Oxygen Saturation   - 100 %    Exercise Oxygen Saturation  during 6 min walk  - 100 %    Max Ex. HR 95 bpm 88 bpm    Max Ex. BP 132/48 154/72    2 Minute Post BP 128/50  -       Psychological, QOL, Others - Outcomes: PHQ 2/9: Depression screen PHQ 2/9 06/09/2017  Decreased Interest 0  Down, Depressed, Hopeless 0  PHQ - 2 Score 0  Altered sleeping 1  Tired, decreased energy 0  Change in appetite 0  Feeling bad or failure about yourself  0  Trouble concentrating 0  Moving slowly or fidgety/restless 0  Suicidal thoughts 0  PHQ-9 Score 1  Difficult doing work/chores Not difficult at all    Quality of Life:     Quality of Life - 06/09/17 1426      Quality of Life Scores   Health/Function Pre 20.43 %   Socioeconomic Pre 21 %   Psych/Spiritual Pre 20.57 %   Family Pre 20.5 %   GLOBAL Pre 20.58 %      Personal Goals: Goals established at orientation with interventions provided to work  toward goal.     Personal Goals and Risk Factors at Admission - 06/09/17 1421      Core Components/Risk Factors/Patient Goals on Admission    Weight Management Weight Gain;Yes   Intervention Weight Management: Provide education and appropriate resources to help participant work on and attain dietary goals.;Weight Management: Develop a combined nutrition and exercise program designed to reach desired caloric intake, while maintaining appropriate intake of nutrient and fiber, sodium and fats, and appropriate energy expenditure required for the weight goal.  Patient states she would like to gain weight in her extremeties because they are "looking too thin"   Admit Weight 182 lb 6.4 oz (82.7 kg)   Goal Weight: Short Term 184 lb (83.5 kg)   Goal Weight: Long Term 186 lb (84.4 kg)  patient unsure, just wants to not look too thin in arms   Expected Outcomes Short Term: Continue to assess and modify interventions until short term weight is achieved;Long Term: Adherence to nutrition and physical activity/exercise program aimed toward attainment of established weight goal;Weight Maintenance: Understanding of the daily nutrition guidelines, which includes 25-35% calories from fat, 7% or less cal from saturated fats, less than '200mg'$  cholesterol, less than 1.5gm of sodium, & 5 or more servings of fruits and vegetables daily;Understanding recommendations for meals to include 15-35% energy as protein, 25-35% energy from fat, 35-60% energy from carbohydrates, less than '200mg'$  of dietary cholesterol, 20-35 gm of total fiber daily;Understanding of distribution of calorie intake throughout the day with the consumption of 4-5 meals/snacks   Diabetes Yes   Intervention Provide education about signs/symptoms and action to take for hypo/hyperglycemia.;Provide education about proper nutrition, including hydration, and aerobic/resistive exercise prescription along with prescribed medications to achieve blood glucose in normal  ranges: Fasting glucose 65-99 mg/dL   Expected Outcomes Short Term: Participant verbalizes understanding of the signs/symptoms and immediate care of hyper/hypoglycemia, proper foot care and importance of medication, aerobic/resistive exercise  and nutrition plan for blood glucose control.;Long Term: Attainment of HbA1C < 7%.   Heart Failure Yes   Intervention Provide a combined exercise and nutrition program that is supplemented with education, support and counseling about heart failure. Directed toward relieving symptoms such as shortness of breath, decreased exercise tolerance, and extremity edema.   Expected Outcomes Improve functional capacity of life;Short term: Attendance in program 2-3 days a week with increased exercise capacity. Reported lower sodium intake. Reported increased fruit and vegetable intake. Reports medication compliance.;Short term: Daily weights obtained and reported for increase. Utilizing diuretic protocols set by physician.;Long term: Adoption of self-care skills and reduction of barriers for early signs and symptoms recognition and intervention leading to self-care maintenance.       Personal Goals Discharge:     Goals and Risk Factor Review    Row Name 07/13/17 1656 08/04/17 1118           Core Components/Risk Factors/Patient Goals Review   Personal Goals Review Hypertension;Diabetes;Heart Failure Other;Hypertension;Diabetes;Lipids      Review Idania weighs in daily.  She also measures BP and BG daily.  All numbers have been good so far.   Angelicia saw Dr Rockey Situ 08/02/17.  She stated all was good - she is on a smaller does of aspirin.  She walks every day when not attending HT.      Expected Outcomes Short - Benelli will maintain her healthy habits and good numbers.  Long - Renesmee will maintain control of BP and BG.  Short - Denisia will continue to exercise and maintain healthy habits.  Long - Mekhi will manage her DM and heart condition long term.         Exercise Goals  and Review:     Exercise Goals    Row Name 06/09/17 1506             Exercise Goals   Increase Physical Activity Yes       Intervention Provide advice, education, support and counseling about physical activity/exercise needs.;Develop an individualized exercise prescription for aerobic and resistive training based on initial evaluation findings, risk stratification, comorbidities and participant's personal goals.       Expected Outcomes Achievement of increased cardiorespiratory fitness and enhanced flexibility, muscular endurance and strength shown through measurements of functional capacity and personal statement of participant.       Increase Strength and Stamina Yes       Intervention Provide advice, education, support and counseling about physical activity/exercise needs.;Develop an individualized exercise prescription for aerobic and resistive training based on initial evaluation findings, risk stratification, comorbidities and participant's personal goals.       Expected Outcomes Achievement of increased cardiorespiratory fitness and enhanced flexibility, muscular endurance and strength shown through measurements of functional capacity and personal statement of participant.          Nutrition & Weight - Outcomes:     Pre Biometrics - 06/09/17 1505      Pre Biometrics   Height '5\' 9"'$  (1.753 m)   Weight 182 lb 6.4 oz (82.7 kg)   Waist Circumference 41.25 inches   Hip Circumference 38.5 inches   Waist to Hip Ratio 1.07 %   BMI (Calculated) 26.92         Post Biometrics - 08/11/17 1707       Post  Biometrics   Height '5\' 9"'$  (1.753 m)   Weight 180 lb 4.8 oz (81.8 kg)   Waist Circumference 40.5 inches   Hip Circumference 40.5 inches  Waist to Hip Ratio 1 %   BMI (Calculated) 26.61      Nutrition:     Nutrition Therapy & Goals - 07/28/17 1138      Nutrition Therapy   RD appointment defered Yes     Personal Nutrition Goals   Comments Ms. Nordmann states that she  has had an RD visit once before, has her resources, and is following a healthy diet so does not need RD assistance at this time.      Nutrition Discharge:   Education Questionnaire Score:     Knowledge Questionnaire Score - 06/09/17 1427      Knowledge Questionnaire Score   Pre Score 17/28   Correct answers reviewed with Inez Catalina.      Goals reviewed with patient; copy given to patient.

## 2017-08-17 NOTE — Progress Notes (Signed)
Daily Session Note  Patient Details  Name: Breeona Waid MRN: [id number] Date of Birth: 1941-03-21 Referring Provider:     Cardiac Rehab from 06/09/2017 in [facility] Cardiac and Pulmonary Rehab  Referring Provider  Arida      Encounter Date: 08/17/2017  Check In:     Session Check In - 08/17/17 1710      Check-In   Staff Present Heath Lark, RN, BSN, CCRP;Meredith Sherryll Burger, RN Vickki Hearing, [place], ACSM CEP, Exercise Physiologist   Supervising physician immediately available to respond to emergencies See telemetry face sheet for immediately available ER MD   Medication changes reported     No   Fall or balance concerns reported    No   Warm-up and Cool-down Performed on first and last piece of equipment   Resistance Training Performed Yes   VAD Patient? No     Pain Assessment   Currently in Pain? No/denies         History  Smoking Status  . Never Smoker  Smokeless Tobacco  . Never Used    Goals Met:  Independence with exercise equipment Exercise tolerated well No report of cardiac concerns or symptoms Strength training completed today  Goals Unmet:  Not Applicable  Comments:  Ethne graduated today from cardiac rehab with 36 sessions completed.  Details of the patient's exercise prescription and what She needs to do in order to continue the prescription and progress were discussed with patient.  Patient was given a copy of prescription and goals.  Patient verbalized understanding.  Shaaron plans to continue to exercise by exercising at home and local gym.    Dr. Emily Filbert is Medical Director for [facility].

## 2017-08-17 NOTE — Progress Notes (Signed)
Cardiac Individual Treatment Plan  Patient Details  Name: Mercedees Convery MRN: [id number] Date of Birth: 05-21-1941 Referring Provider:     Cardiac Rehab from 06/09/2017 in [facility] Cardiac and Pulmonary Rehab  Referring Provider  Arida      Initial Encounter Date:    Cardiac Rehab from 06/09/2017 in [facility] Cardiac and Pulmonary Rehab  Date  06/09/17  Referring Provider  Fletcher Anon      Visit Diagnosis: S/P CABG x 3  Patient's Home Medications on Admission:  Current Outpatient Prescriptions:  .  alum & mag hydroxide-simeth (MAALOX/MYLANTA) 200-200-20 MG/5ML suspension, Take 15 mLs by mouth every 6 (six) hours as needed for indigestion or heartburn., Disp: , Rfl:  .  aspirin EC 81 MG tablet, Take 1 tablet (81 mg total) by mouth daily., Disp: 90 tablet, Rfl: 3 .  atorvastatin (LIPITOR) 40 MG tablet, Take 1 tablet (40 mg total) by mouth daily at 6 PM., Disp: 90 tablet, Rfl: 1 .  benazepril-hydrochlorthiazide (LOTENSIN HCT) 20-12.5 MG per tablet, Take 1 tablet by mouth 2 (two) times daily. , Disp: , Rfl:  .  Chlorpheniramine Maleate (ALLERGY PO), Take 1 tablet by mouth daily as needed (allergies)., Disp: , Rfl:  .  cholecalciferol (VITAMIN D) 1000 units tablet, Take 2,000 Units by mouth daily at 12 noon. , Disp: , Rfl:  .  cloNIDine (CATAPRES) 0.2 MG tablet, Take 1 tablet (0.2 mg total) by mouth 2 (two) times daily., Disp: 90 tablet, Rfl: 1 .  clopidogrel (PLAVIX) 75 MG tablet, Take 1 tablet (75 mg total) by mouth daily., Disp: 90 tablet, Rfl: 3 .  insulin glargine (LANTUS) 100 UNIT/ML injection, Inject 45 Units into the skin at bedtime., Disp: , Rfl:  .  insulin lispro (HUMALOG) 100 UNIT/ML injection, Inject 12-14 Units into the skin 2 (two) times daily. 12 units with lunch and 14 units with dinner, Disp: , Rfl:  .  insulin lispro protamine-lispro (HUMALOG 75/25 MIX) (75-25) 100 UNIT/ML SUSP injection, Inject 19-38 Units into the skin daily with breakfast., Disp: , Rfl:  .  levothyroxine (SYNTHROID,  LEVOTHROID) 100 MCG tablet, Take 100 mcg by mouth daily before breakfast., Disp: , Rfl:  .  metoprolol tartrate (LOPRESSOR) 25 MG tablet, Take 1 tablet (25 mg total) by mouth 2 (two) times daily., Disp: 180 tablet, Rfl: 0 .  Multiple Vitamin (MULTIVITAMIN) capsule, Take 1 capsule by mouth daily at 12 noon. , Disp: , Rfl:  .  nitroGLYCERIN (NITROSTAT) 0.4 MG SL tablet, Place 1 tablet (0.4 mg total) under the tongue every 5 (five) minutes as needed for chest pain., Disp: 30 tablet, Rfl: 12 .  Polyethyl Glycol-Propyl Glycol (SYSTANE OP), Apply 1 drop to eye as needed (dry eyes)., Disp: , Rfl:   Past Medical History: Past Medical History:  Diagnosis Date  . CAD (coronary artery disease)    a. 04/2017 Cath: LM 80, LAD 80p, 69m LCX 95ost, 759mEF 45-50%; b. 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  . Diastolic dysfunction    a. 04/2017 Echo: EF 55-60%, no rwma, Gr1 DD, mildly dil LA.  . Marland Kitchenyperlipidemia   . Hypertensive heart disease   . Insulin dependent diabetes mellitus (HCC)     Tobacco Use: History  Smoking Status  . Never Smoker  Smokeless Tobacco  . Never Used    Labs: Recent Review Flowsheet Data    Labs for ITP Cardiac and Pulmonary Rehab Latest Ref Rng & Units 05/09/2017 05/09/2017 05/09/2017 05/09/2017 05/10/2017   Cholestrol 0 - 200 mg/dL - - - - -  LDLCALC 0 - 99 mg/dL - - - - -   HDL >40 mg/dL - - - - -   Trlycerides <150 mg/dL - - - - -   Hemoglobin A1c 4.8 - 5.6 % - - - - -   PHART 7.350 - 7.450 7.455(H) 7.355 7.381 - -   PCO2ART 32.0 - 48.0 mmHg 33.3 40.1 41.3 - -   HCO3 20.0 - 28.0 mmol/L 23.7 22.6 24.6 - -   TCO2 0 - 100 mmol/L _0 ACIDBASEDEF 0.0 - 2.0 mmol/L - 3.0(H) 1.0 - -   O2SAT % 99.0 97.0 97.0 - -       Exercise Target Goals:    Exercise Program Goal: Individual exercise prescription set with THRR, safety & activity barriers. Participant demonstrates ability to understand and report RPE using BORG scale, to self-measure pulse  accurately, and to acknowledge the importance of the exercise prescription.  Exercise Prescription Goal: Starting with aerobic activity 30 plus minutes a day, 3 days per week for initial exercise prescription. Provide home exercise prescription and guidelines that participant acknowledges understanding prior to discharge.  Activity Barriers & Risk Stratification:     Activity Barriers & Cardiac Risk Stratification - 06/09/17 1429      Activity Barriers & Cardiac Risk Stratification   Activity Barriers Back Problems   Cardiac Risk Stratification High      6 Minute Walk:     6 Minute Walk    Row Name 06/09/17 1506 08/11/17 1705       6 Minute Walk   Phase Initial Discharge    Distance 900 feet 1186 feet    Distance % Change  - 32 %    Distance Feet Change  - 286 ft    Walk Time 6 minutes 6 minutes    # of Rest Breaks 0 0    MPH 1.7 2.25    METS 2 2.6    RPE 15 9    VO2 Peak 7.03 9.11    Symptoms No No    Resting HR 68 bpm 73 bpm    Resting BP 130/58 130/58    Resting Oxygen Saturation   - 100 %    Exercise Oxygen Saturation  during 6 min walk  - 100 %    Max Ex. HR 95 bpm 88 bpm    Max Ex. BP 132/48 154/72    2 Minute Post BP 128/50  -       Oxygen Initial Assessment:     Oxygen Initial Assessment - 07/27/17 1228      Home Oxygen   Home Oxygen Device None   Home Exercise Oxygen Prescription None     Initial 6 min Walk   Oxygen Used None     Program Oxygen Prescription   Program Oxygen Prescription None      Oxygen Re-Evaluation:   Oxygen Discharge (Final Oxygen Re-Evaluation):   Initial Exercise Prescription:     Initial Exercise Prescription - 06/09/17 1500      Date of Initial Exercise RX and Referring Provider   Date 06/09/17   Referring Provider Arida     Treadmill   MPH 1.5   Grade 0   Minutes 15   METs 2     NuStep   Level 2   SPM 60   Minutes 15   METs 2     Biostep-RELP   Level 2   SPM 50   Minutes 15   METs  2      Track   Laps 25   Minutes 15   METs 2     Prescription Details   Frequency (times per week) 3   Duration Progress to 30 minutes of continuous aerobic without signs/symptoms of physical distress     Intensity   THRR 40-80% of Max Heartrate 99-130   Ratings of Perceived Exertion 11-13   Perceived Dyspnea 0-4     Resistance Training   Training Prescription Yes   Weight 2   Reps 10-15      Perform Capillary Blood Glucose checks as needed.  Exercise Prescription Changes:     Exercise Prescription Changes    Row Name 06/09/17 1400 06/23/17 1100 07/06/17 1500 07/20/17 1500 08/04/17 1600     Response to Exercise   Blood Pressure (Admit) 130/58 134/62 132/82 132/72  -   Blood Pressure (Exercise) 132/48 112/52 140/80 124/58  -   Blood Pressure (Exit) 128/50 148/80 110/60 138/88  -   Heart Rate (Admit) 70 bpm 81 bpm 62 bpm 57 bpm  -   Heart Rate (Exercise) 95 bpm 101 bpm 77 bpm 93 bpm  -   Heart Rate (Exit) 67 bpm 70 bpm 69 bpm 53 bpm  -   Oxygen Saturation (Admit) 100 %  -  -  -  -   Oxygen Saturation (Exercise) 100 %  -  -  -  -   Rating of Perceived Exertion (Exercise) _0 -   Symptoms  - none high BG - see daily note  -  -   Duration  - Progress to 45 minutes of aerobic exercise without signs/symptoms of physical distress Progress to 45 minutes of aerobic exercise without signs/symptoms of physical distress Progress to 45 minutes of aerobic exercise without signs/symptoms of physical distress Progress to 45 minutes of aerobic exercise without signs/symptoms of physical distress   Intensity  - THRR unchanged THRR unchanged THRR unchanged THRR unchanged     Progression   Progression  - Continue to progress workloads to maintain intensity without signs/symptoms of physical distress. Continue to progress workloads to maintain intensity without signs/symptoms of physical distress. Continue to progress workloads to maintain intensity without signs/symptoms of physical  distress. Continue to progress workloads to maintain intensity without signs/symptoms of physical distress.   Average METs  - 1.85 2.15 2.15 2.15     Resistance Training   Training Prescription  - Yes Yes Yes Yes   Weight  - _1 Reps  - 10-15 10-15 10-15 10-15     Interval Training   Interval Training  - No No No No     Treadmill   MPH  - 1.2 1.5 1.5 1.5   Grade  - 0 0 0 0   Minutes  - _2 METs  - 1.9 2.15 2.15 2.15     NuStep   Level  - 2  - 3 3   Minutes  - 15  - 15 15   METs  - 1.8  -  -  -     Home Exercise Plan   Plans to continue exercise at  -  -  -  - Home (comment)  add 1-2 days of walking and stretching at home    Frequency  -  -  -  - Add 2 additional days to program exercise sessions.   Initial Home Exercises Provided  -  -  -  -  08/04/17   Row Name 08/05/17 1200             Response to Exercise   Blood Pressure (Admit) 126/58       Blood Pressure (Exercise) 130/67       Blood Pressure (Exit) 124/82       Heart Rate (Admit) 73 bpm       Heart Rate (Exercise) 100 bpm       Heart Rate (Exit) 66 bpm       Rating of Perceived Exertion (Exercise) 13       Duration Continue with 45 min of aerobic exercise without signs/symptoms of physical distress.       Intensity THRR unchanged         Progression   Progression Continue to progress workloads to maintain intensity without signs/symptoms of physical distress.       Average METs 2.7         Resistance Training   Training Prescription Yes       Weight 2       Reps 10-15         Interval Training   Interval Training No         Treadmill   MPH 2       Grade 2       Minutes 15       METs 3         Biostep-RELP   Level 2       SPM 50       Minutes 15       METs 2         Home Exercise Plan   Plans to continue exercise at Home (comment)  add 1-2 days of walking and stretching at home        Frequency Add 2 additional days to program exercise sessions.       Initial Home Exercises  Provided 08/04/17          Exercise Comments:     Exercise Comments    Row Name 07/04/17 1734 08/17/17 1729         Exercise Comments Kaylena was sweating on TM more than usual.  Her BG was 317.  She did the strength and stetching exercises and education.  Matalynn graduated today from cardiac rehab with 36 sessions completed.  Details of the patient's exercise prescription and what She needs to do in order to continue the prescription and progress were discussed with patient.  Patient was given a copy of prescription and goals.  Patient verbalized understanding.  Hazel plans to continue to exercise by exercising at home and local gym.         Exercise Goals and Review:     Exercise Goals    Row Name 06/09/17 1506             Exercise Goals   Increase Physical Activity Yes       Intervention Provide advice, education, support and counseling about physical activity/exercise needs.;Develop an individualized exercise prescription for aerobic and resistive training based on initial evaluation findings, risk stratification, comorbidities and participant's personal goals.       Expected Outcomes Achievement of increased cardiorespiratory fitness and enhanced flexibility, muscular endurance and strength shown through measurements of functional capacity and personal statement of participant.       Increase Strength and Stamina Yes       Intervention Provide advice, education, support and counseling about physical activity/exercise needs.;Develop an individualized exercise  prescription for aerobic and resistive training based on initial evaluation findings, risk stratification, comorbidities and participant's personal goals.       Expected Outcomes Achievement of increased cardiorespiratory fitness and enhanced flexibility, muscular endurance and strength shown through measurements of functional capacity and personal statement of participant.          Exercise Goals Re-Evaluation :      Exercise Goals Re-Evaluation    Row Name 06/23/17 1142 07/06/17 1526 07/20/17 1527 08/04/17 1657 08/11/17 1708     Exercise Goal Re-Evaluation   Exercise Goals Review Increase Physical Activity;Increase Strength and Stamina Increase Physical Activity;Increase Strength and Stamina Increase Physical Activity;Increase Strength and Stamina Increase Physical Activity;Increase Strength and Stamina;Able to understand and use rate of perceived exertion (RPE) scale;Able to check pulse independently;Knowledge and understanding of Target Heart Rate Range (THRR);Understanding of Exercise Prescription Increase Physical Activity;Increase Strength and Stamina   Comments Rebeckah will need to progress slowly with exercise.   Her intital TM speed was reduced.  Staff will monitor progress. Lan has attended on a regular basis.  Staff will continue to monitor progression. Kinsie is tolerating exercise well.  Staff will continue to monitor for progress. Home exercise guidelines were reviewed with patinet. She demonstrated understanding of these guidelines. 6 min walk test done today. Results reviewed with patient. See 6 min walk data sheet for detailed report. She increased her distance by 186 feet.    Expected Outcomes Short - Timarie will attend HT regularly.  Long - Sharnay will increase overall fitness Short - Necha will continue to attend.  Long - Charlise will continue to build stamina. Short - Joe will continue to attend HT.  Long - Bettty will progress levels on the machines and increase MET level. Short: Aniaya will walk/stetch at home 1-2 extra days outside of class. Long: Shaia will independently and safely exercise at home.  Short: Elaysia will graduate from [facility]. Long: Kindal will independently and safely exercise at home and at community fitness center.       Discharge Exercise Prescription (Final Exercise Prescription Changes):     Exercise Prescription Changes - 08/05/17 1200      Response to Exercise    Blood Pressure (Admit) 126/58   Blood Pressure (Exercise) 130/67   Blood Pressure (Exit) 124/82   Heart Rate (Admit) 73 bpm   Heart Rate (Exercise) 100 bpm   Heart Rate (Exit) 66 bpm   Rating of Perceived Exertion (Exercise) 13   Duration Continue with 45 min of aerobic exercise without signs/symptoms of physical distress.   Intensity THRR unchanged     Progression   Progression Continue to progress workloads to maintain intensity without signs/symptoms of physical distress.   Average METs 2.7     Resistance Training   Training Prescription Yes   Weight 2   Reps 10-15     Interval Training   Interval Training No     Treadmill   MPH 2   Grade 2   Minutes 15   METs 3     Biostep-RELP   Level 2   SPM 50   Minutes 15   METs 2     Home Exercise Plan   Plans to continue exercise at Home (comment)  add 1-2 days of walking and stretching at home    Frequency Add 2 additional days to program exercise sessions.   Initial Home Exercises Provided 08/04/17      Nutrition:  Target Goals: Understanding of nutrition guidelines, daily intake of sodium <  $'1500mg'S$ , cholesterol '200mg'$ , calories 30% from fat and 7% or less from saturated fats, daily to have 5 or more servings of fruits and vegetables.  Biometrics:     Pre Biometrics - 06/09/17 1505      Pre Biometrics   Height '5\' 9"'$  (1.753 m)   Weight 182 lb 6.4 oz (82.7 kg)   Waist Circumference 41.25 inches   Hip Circumference 38.5 inches   Waist to Hip Ratio 1.07 %   BMI (Calculated) 26.92         Post Biometrics - 08/11/17 1707       Post  Biometrics   Height '5\' 9"'$  (1.753 m)   Weight 180 lb 4.8 oz (81.8 kg)   Waist Circumference 40.5 inches   Hip Circumference 40.5 inches   Waist to Hip Ratio 1 %   BMI (Calculated) 26.61      Nutrition Therapy Plan and Nutrition Goals:     Nutrition Therapy & Goals - 07/28/17 1138      Nutrition Therapy   RD appointment defered Yes     Personal Nutrition Goals   Comments  Ms. Langhorne states that she has had an RD visit once before, has her resources, and is following a healthy diet so does not need RD assistance at this time.      Nutrition Discharge: Rate Your Plate Scores:   Nutrition Goals Re-Evaluation:     Nutrition Goals Re-Evaluation    Row Name 07/13/17 1654             Goals   Comment Jaydeen wants to meet with the RD.  Appt not open til end of Oct so email was sent to see if she could get in sooner.       Expected Outcome SHort - Marriana will meet with RD.  Long - RD will provide info to help her stay healthy and attain weight goals.          Nutrition Goals Discharge (Final Nutrition Goals Re-Evaluation):     Nutrition Goals Re-Evaluation - 07/13/17 1654      Goals   Comment Keyonni wants to meet with the RD.  Appt not open til end of Oct so email was sent to see if she could get in sooner.   Expected Outcome SHort - Tyrhonda will meet with RD.  Long - RD will provide info to help her stay healthy and attain weight goals.      Psychosocial: Target Goals: Acknowledge presence or absence of significant depression and/or stress, maximize coping skills, provide positive support system. Participant is able to verbalize types and ability to use techniques and skills needed for reducing stress and depression.   Initial Review & Psychosocial Screening:   Quality of Life Scores:      Quality of Life - 06/09/17 1426      Quality of Life Scores   Health/Function Pre 20.43 %   Socioeconomic Pre 21 %   Psych/Spiritual Pre 20.57 %   Family Pre 20.5 %   GLOBAL Pre 20.58 %      PHQ-9: Recent Review Flowsheet Data    Depression screen [facility] 2/9 06/09/2017   Decreased Interest 0   Down, Depressed, Hopeless 0   PHQ - 2 Score 0   Altered sleeping 1   Tired, decreased energy 0   Change in appetite 0   Feeling bad or failure about yourself  0   Trouble concentrating 0   Moving slowly or fidgety/restless 0   Suicidal  thoughts 0   PHQ-9  Score 1   Difficult doing work/chores Not difficult at all     Interpretation of Total Score  Total Score Depression Severity:  1-4 = Minimal depression, 5-9 = Mild depression, 10-14 = Moderate depression, 15-19 = Moderately severe depression, 20-27 = Severe depression   Psychosocial Evaluation and Intervention:     Psychosocial Evaluation - 08/17/17 1703      Discharge Psychosocial Assessment & Intervention   Comments Counselor follow up with Mikaella today stating she is completing this program today and has "learned a lot!"  She reports cooking and eating healthier and and stopped by the gym today to inform she will be joining them regularly beginning this Monday!  Kalysta recognizes that heart disease runs in her family and she has learned to accept that.  While at the same time realizing her lifestyle can contribute to her quality of life.  Paw has made the most of this program and is taking her positive changes with her to be permanent lifestyle changes.  Counselor commended Shumway on her hard work and progress in reaching her goals.  She will be missed in this class!      Psychosocial Re-Evaluation:     Psychosocial Re-Evaluation    Row Name 07/21/17 1539 08/04/17 1120           Psychosocial Re-Evaluation   Current issues with Current Stress Concerns  -      Comments Blood sugar controll problems. Her BG have been under good control.      Expected Outcomes Stable blood sugars which should help stablize dealing with her heart problems also.  Short - Joleen will continue to maintain her exercise to help keep BG under control.  Long - Peityn will follow up with her Dr and maintain good BG levels.      Continue Psychosocial Services  Follow up required by staff  -        Initial Review   Source of Stress Concerns Chronic Illness  -         Psychosocial Discharge (Final Psychosocial Re-Evaluation):     Psychosocial Re-Evaluation - 08/04/17 1120      Psychosocial Re-Evaluation    Comments Her BG have been under good control.   Expected Outcomes Short - Fiona will continue to maintain her exercise to help keep BG under control.  Long - Kennady will follow up with her Dr and maintain good BG levels.      Vocational Rehabilitation: Provide vocational rehab assistance to qualifying candidates.   Vocational Rehab Evaluation & Intervention:     Vocational Rehab - 06/09/17 1427      Initial Vocational Rehab Evaluation & Intervention   Assessment shows need for Vocational Rehabilitation No      Education: Education Goals: Education classes will be provided on a variety of topics geared toward better understanding of heart health and risk factor modification. Participant will state understanding/return demonstration of topics presented as noted by education test scores.  Learning Barriers/Preferences:     Learning Barriers/Preferences - 06/09/17 1427      Learning Barriers/Preferences   Learning Barriers None   Learning Preferences Individual Instruction      Education Topics: General Nutrition Guidelines/Fats and Fiber: -Group instruction provided by verbal, written material, models and posters to present the general guidelines for heart healthy nutrition. Gives an explanation and review of dietary fats and fiber.   Cardiac Rehab from 08/17/2017 in [facility] Cardiac and Pulmonary Rehab  Date  08/01/17  Educator  PI  Instruction Review Code  1- Verbalizes Understanding      Controlling Sodium/Reading Food Labels: -Group verbal and written material supporting the discussion of sodium use in heart healthy nutrition. Review and explanation with models, verbal and written materials for utilization of the food label.   Cardiac Rehab from 08/17/2017 in [facility] Cardiac and Pulmonary Rehab  Date  08/08/17  Educator  PI  Instruction Review Code  1- Verbalizes Understanding      Exercise Physiology & Risk Factors: - Group verbal and written instruction with models  to review the exercise physiology of the cardiovascular system and associated critical values. Details cardiovascular disease risk factors and the goals associated with each risk factor.   Cardiac Rehab from 08/17/2017 in [facility] Cardiac and Pulmonary Rehab  Date  08/15/17  Educator  [facility]  Instruction Review Code  1- Verbalizes Understanding      Aerobic Exercise & Resistance Training: - Gives group verbal and written discussion on the health impact of inactivity. On the components of aerobic and resistive training programs and the benefits of this training and how to safely progress through these programs.   Cardiac Rehab from 08/17/2017 in [facility] Cardiac and Pulmonary Rehab  Date  06/27/17  Educator  [facility]  Instruction Review Code  1- Geologist, engineering, Balance, General Exercise Guidelines: - Provides group verbal and written instruction on the benefits of flexibility and balance training programs. Provides general exercise guidelines with specific guidelines to those with heart or lung disease. Demonstration and skill practice provided.   Cardiac Rehab from 08/17/2017 in [facility] Cardiac and Pulmonary Rehab  Date  06/29/17  Educator  AS  Instruction Review Code  1- Verbalizes Understanding      Stress Management: - Provides group verbal and written instruction about the health risks of elevated stress, cause of high stress, and healthy ways to reduce stress.   Cardiac Rehab from 08/17/2017 in [facility] Cardiac and Pulmonary Rehab  Date  07/06/17  Educator  [facility]  Instruction Review Code  1- Verbalizes Understanding      Depression: - Provides group verbal and written instruction on the correlation between heart/lung disease and depressed mood, treatment options, and the stigmas associated with seeking treatment.   Cardiac Rehab from 08/17/2017 in [facility] Cardiac and Pulmonary Rehab  Date  08/03/17  Educator  [facility]  Instruction Review Code  1- Verbalizes Understanding       Anatomy & Physiology of the Heart: - Group verbal and written instruction and models provide basic cardiac anatomy and physiology, with the coronary electrical and arterial systems. Review of: AMI, Angina, Valve disease, Heart Failure, Cardiac Arrhythmia, Pacemakers, and the ICD.   Cardiac Rehab from 08/17/2017 in [facility] Cardiac and Pulmonary Rehab  Date  07/04/17  Educator  [facility]  Instruction Review Code  1- Verbalizes Understanding      Cardiac Procedures: - Group verbal and written instruction to review commonly prescribed medications for heart disease. Reviews the medication, class of the drug, and side effects. Includes the steps to properly store meds and maintain the prescription regimen. (beta blockers and nitrates)   Cardiac Rehab from 08/17/2017 in [facility] Cardiac and Pulmonary Rehab  Date  07/11/17  Educator  [facility]  Instruction Review Code  1- Verbalizes Understanding      Cardiac Medications I: - Group verbal and written instruction to review commonly prescribed medications for heart disease. Reviews the medication, class of the drug, and side effects.  Includes the steps to properly store meds and maintain the prescription regimen.   Cardiac Rehab from 08/17/2017 in [facility] Cardiac and Pulmonary Rehab  Date  07/18/17 [Part 2 07/20/17 [facility]]  Educator  MA  Instruction Review Code  1- Verbalizes Understanding      Cardiac Medications II: -Group verbal and written instruction to review commonly prescribed medications for heart disease. Reviews the medication, class of the drug, and side effects. (all other drug classes)    Go Sex-Intimacy & Heart Disease, Get SMART - Goal Setting: - Group verbal and written instruction through game format to discuss heart disease and the return to sexual intimacy. Provides group verbal and written material to discuss and apply goal setting through the application of the S.M.A.R.T. Method.   Cardiac Rehab from 08/17/2017 in [facility] Cardiac and Pulmonary  Rehab  Date  07/11/17  Educator  [facility]  Instruction Review Code  1- Verbalizes Understanding      Other Matters of the Heart: - Provides group verbal, written materials and models to describe Heart Failure, Angina, Valve Disease, Peripheral Artery Disease, and Diabetes in the realm of heart disease. Includes description of the disease process and treatment options available to the cardiac patient.   Cardiac Rehab from 08/17/2017 in [facility] Cardiac and Pulmonary Rehab  Date  07/04/17  Educator  [facility]  Instruction Review Code  1- Verbalizes Understanding      Exercise & Equipment Safety: - Individual verbal instruction and demonstration of equipment use and safety with use of the equipment.   Cardiac Rehab from 08/17/2017 in [facility] Cardiac and Pulmonary Rehab  Date  06/09/17  Educator  [facility]      Infection Prevention: - Provides verbal and written material to individual with discussion of infection control including proper hand washing and proper equipment cleaning during exercise session.   Cardiac Rehab from 08/17/2017 in [facility] Cardiac and Pulmonary Rehab  Date  06/09/17  Educator  [facility]      Falls Prevention: - Provides verbal and written material to individual with discussion of falls prevention and safety.   Cardiac Rehab from 08/17/2017 in [facility] Cardiac and Pulmonary Rehab  Date  06/09/17  Educator  [facility]  Instruction Review Code (retired)  2- meets goals/outcomes      Diabetes: - Individual verbal and written instruction to review signs/symptoms of diabetes, desired ranges of glucose level fasting, after meals and with exercise. Acknowledge that pre and post exercise glucose checks will be done for 3 sessions at entry of program.   Cardiac Rehab from 08/17/2017 in [facility] Cardiac and Pulmonary Rehab  Date  06/09/17  Educator  [facility]      Other: -Provides group and verbal instruction on various topics (see comments)    Knowledge Questionnaire Score:     Knowledge Questionnaire Score -  06/09/17 1427      Knowledge Questionnaire Score   Pre Score 17/28   Correct answers reviewed with Inez Catalina.      Core Components/Risk Factors/Patient Goals at Admission:     Personal Goals and Risk Factors at Admission - 06/09/17 1421      Core Components/Risk Factors/Patient Goals on Admission    Weight Management Weight Gain;Yes   Intervention Weight Management: Provide education and appropriate resources to help participant work on and attain dietary goals.;Weight Management: Develop a combined nutrition and exercise program designed to reach desired caloric intake, while maintaining appropriate intake of nutrient and fiber, sodium and fats, and appropriate energy expenditure required for the weight goal.  Patient states she would like to gain weight in her extremeties because they are "looking too thin"   Admit Weight 182 lb 6.4 oz (82.7 kg)   Goal Weight: Short Term 184 lb (83.5 kg)   Goal Weight: Long Term 186 lb (84.4 kg)  patient unsure, just wants to not look too thin in arms   Expected Outcomes Short Term: Continue to assess and modify interventions until short term weight is achieved;Long Term: Adherence to nutrition and physical activity/exercise program aimed toward attainment of established weight goal;Weight Maintenance: Understanding of the daily nutrition guidelines, which includes 25-35% calories from fat, 7% or less cal from saturated fats, less than '200mg'$  cholesterol, less than 1.5gm of sodium, & 5 or more servings of fruits and vegetables daily;Understanding recommendations for meals to include 15-35% energy as protein, 25-35% energy from fat, 35-60% energy from carbohydrates, less than '200mg'$  of dietary cholesterol, 20-35 gm of total fiber daily;Understanding of distribution of calorie intake throughout the day with the consumption of 4-5 meals/snacks   Diabetes Yes   Intervention Provide education about signs/symptoms and action to take for hypo/hyperglycemia.;Provide  education about proper nutrition, including hydration, and aerobic/resistive exercise prescription along with prescribed medications to achieve blood glucose in normal ranges: Fasting glucose 65-99 mg/dL   Expected Outcomes Short Term: Participant verbalizes understanding of the signs/symptoms and immediate care of hyper/hypoglycemia, proper foot care and importance of medication, aerobic/resistive exercise and nutrition plan for blood glucose control.;Long Term: Attainment of HbA1C < 7%.   Heart Failure Yes   Intervention Provide a combined exercise and nutrition program that is supplemented with education, support and counseling about heart failure. Directed toward relieving symptoms such as shortness of breath, decreased exercise tolerance, and extremity edema.   Expected Outcomes Improve functional capacity of life;Short term: Attendance in program 2-3 days a week with increased exercise capacity. Reported lower sodium intake. Reported increased fruit and vegetable intake. Reports medication compliance.;Short term: Daily weights obtained and reported for increase. Utilizing diuretic protocols set by physician.;Long term: Adoption of self-care skills and reduction of barriers for early signs and symptoms recognition and intervention leading to self-care maintenance.      Core Components/Risk Factors/Patient Goals Review:      Goals and Risk Factor Review    Row Name 07/13/17 1656 08/04/17 1118           Core Components/Risk Factors/Patient Goals Review   Personal Goals Review Hypertension;Diabetes;Heart Failure Other;Hypertension;Diabetes;Lipids      Review Caralyn weighs in daily.  She also measures BP and BG daily.  All numbers have been good so far.   Satomi saw Dr Rockey Situ 08/02/17.  She stated all was good - she is on a smaller does of aspirin.  She walks every day when not attending HT.      Expected Outcomes Short - Greysen will maintain her healthy habits and good numbers.  Long - Ilze will  maintain control of BP and BG.  Short - Dyanara will continue to exercise and maintain healthy habits.  Long - Valissa will manage her DM and heart condition long term.         Core Components/Risk Factors/Patient Goals at Discharge (Final Review):      Goals and Risk Factor Review - 08/04/17 1118      Core Components/Risk Factors/Patient Goals Review   Personal Goals Review Other;Hypertension;Diabetes;Lipids   Review Byanca saw Dr Rockey Situ 08/02/17.  She stated all was good - she is on a smaller does of aspirin.  She  walks every day when not attending HT.   Expected Outcomes Short - Haruye will continue to exercise and maintain healthy habits.  Long - Nuvia will manage her DM and heart condition long term.      ITP Comments:     ITP Comments    Row Name 06/09/17 1416 06/23/17 1646 06/29/17 1142 07/21/17 1539 07/27/17 0833   ITP Comments Med Review Completed. Initial ITP created. Diagnosis can be found in [facility] 05/13/17 Evalyne felt a little shakey on the treadmill. Blood sugar checked at 117, was given peanut butter crackers and continued to exercise on a seated piece of equipment. 30 day review. Continue with ITP unless directed changes per Medical Director review.   Madora called and said her blood sugar dropped at home today so she doesn't feel comfortable coming to Cardiac Rehab this evening and driving herself.  30 day review. Continue with ITP unless directed changes per Medical Director Review.    [place] Name 08/17/17 1734           ITP Comments discharged today          Comments: discharged

## 2017-08-18 ENCOUNTER — Other Ambulatory Visit: Payer: Self-pay | Admitting: Internal Medicine

## 2017-08-18 DIAGNOSIS — Z1231 Encounter for screening mammogram for malignant neoplasm of breast: Secondary | ICD-10-CM

## 2017-09-02 ENCOUNTER — Other Ambulatory Visit: Payer: Self-pay | Admitting: *Deleted

## 2017-09-02 ENCOUNTER — Telehealth: Payer: Self-pay | Admitting: Cardiovascular Disease

## 2017-09-02 MED ORDER — METOPROLOL TARTRATE 25 MG PO TABS: 25.0000 mg | ORAL_TABLET | Freq: Two times a day (BID) | ORAL | 3 refills | Status: DC

## 2017-09-02 NOTE — Telephone Encounter (Signed)
Requested Prescriptions   Signed Prescriptions Disp Refills  . metoprolol tartrate (LOPRESSOR) 25 MG tablet 180 tablet 3    Sig: Take 1 tablet (25 mg total) 2 (two) times daily by mouth.    Authorizing Provider: Minna Merritts    Ordering User: Britt Bottom

## 2017-09-02 NOTE — Telephone Encounter (Signed)
°*  STAT* If patient is at the pharmacy, call can be transferred to refill team.   1. Which medications need to be refilled? (please list name of each medication and dose if known)   Metoprolol 25 mg po bid   2. Which pharmacy/location (including street and city if local pharmacy) is medication to [facility] sent to[facility]  3. Do they need a 30 day or 90 day supply? [place]

## 2017-09-28 ENCOUNTER — Other Ambulatory Visit: Payer: Self-pay | Admitting: Physician Assistant

## 2017-10-13 ENCOUNTER — Other Ambulatory Visit: Payer: Self-pay | Admitting: Internal Medicine

## 2017-10-13 ENCOUNTER — Ambulatory Visit
Admission: RE | Admit: 2017-10-13 | Discharge: 2017-10-13 | Disposition: A | Discharge status: Home / Self Care | Payer: Medicare Other | Source: Ambulatory Visit | Attending: Internal Medicine | Admitting: Internal Medicine

## 2017-10-13 DIAGNOSIS — Z1231 Encounter for screening mammogram for malignant neoplasm of breast: Secondary | ICD-10-CM

## 2017-10-13 IMAGING — MG MM DIGITAL SCREENING BILAT W/ TOMO W/ CAD
8 of 13 series · 8 of 29 positions shown · non-contrast
Comparison: Previous exam(s).

CLINICAL DATA: Screening.

EXAM:
2D DIGITAL SCREENING BILATERAL MAMMOGRAM WITH CAD AND ADJUNCT TOMO

[R CC (1 of 2)]
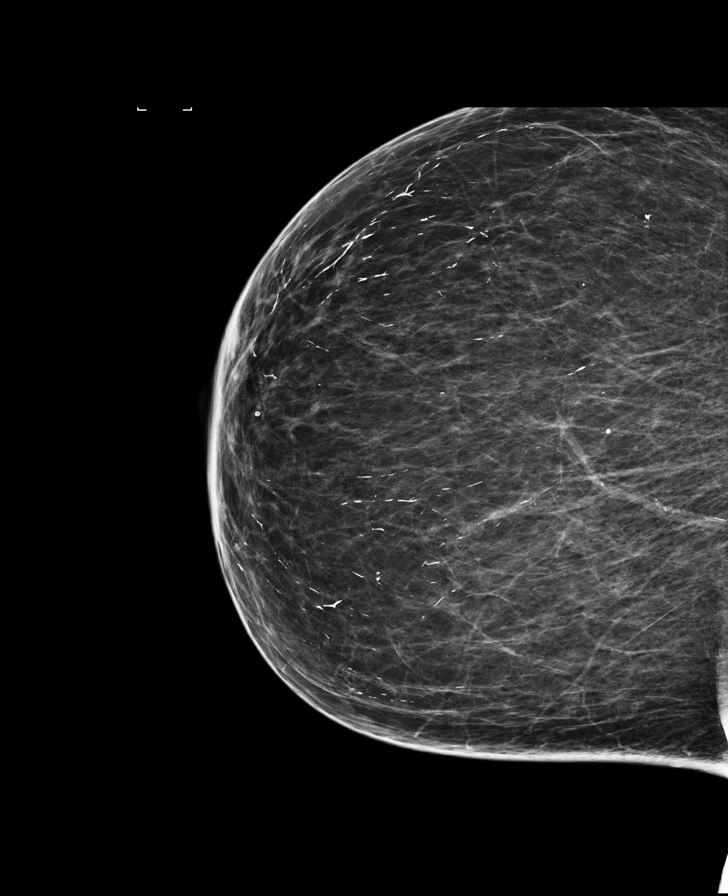

[L MLO synth-2D]
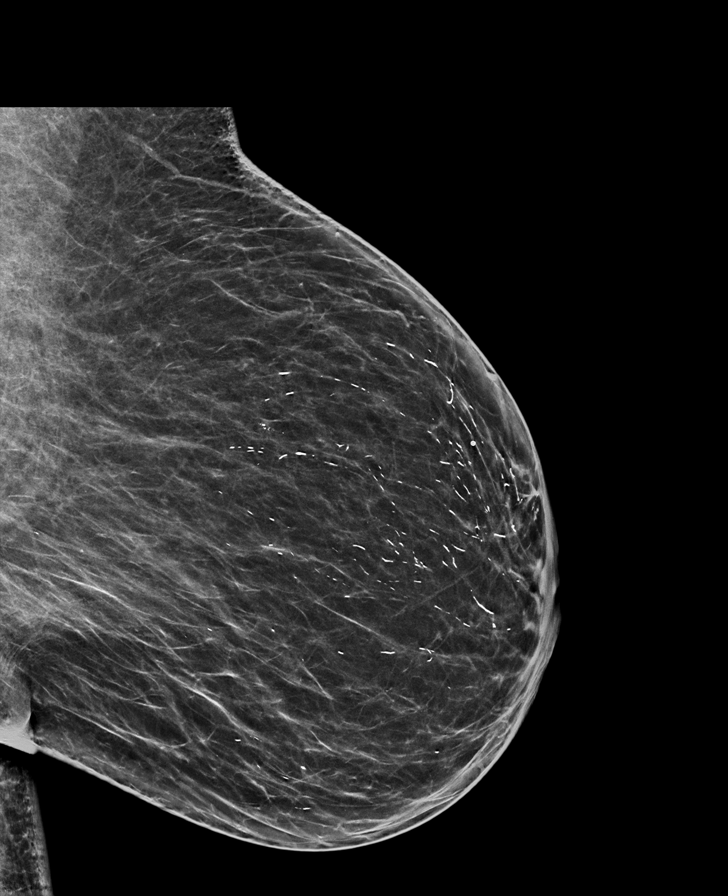

[R CC synth-2D]
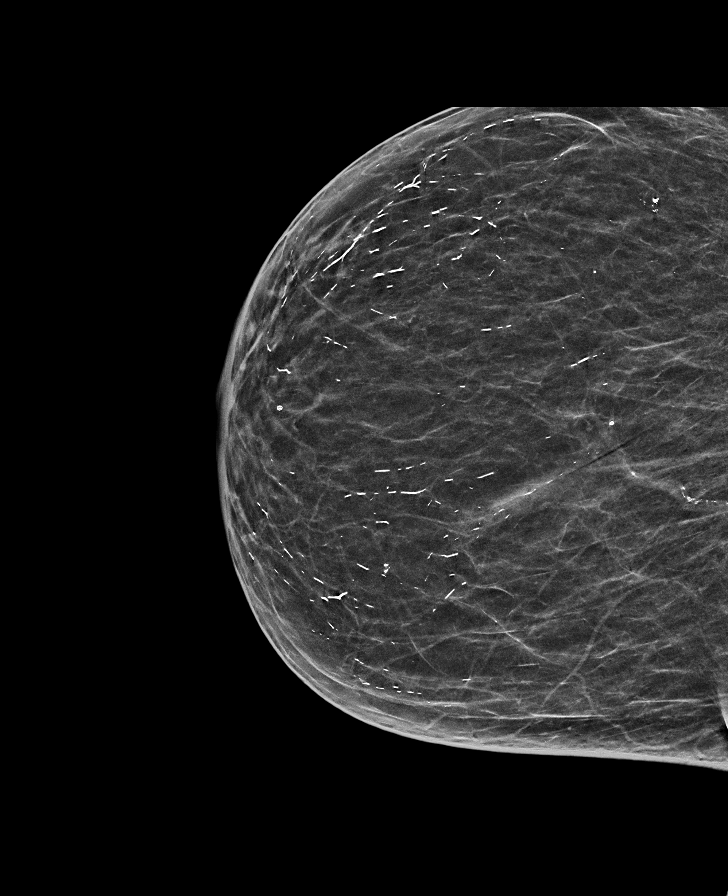

[L CC]
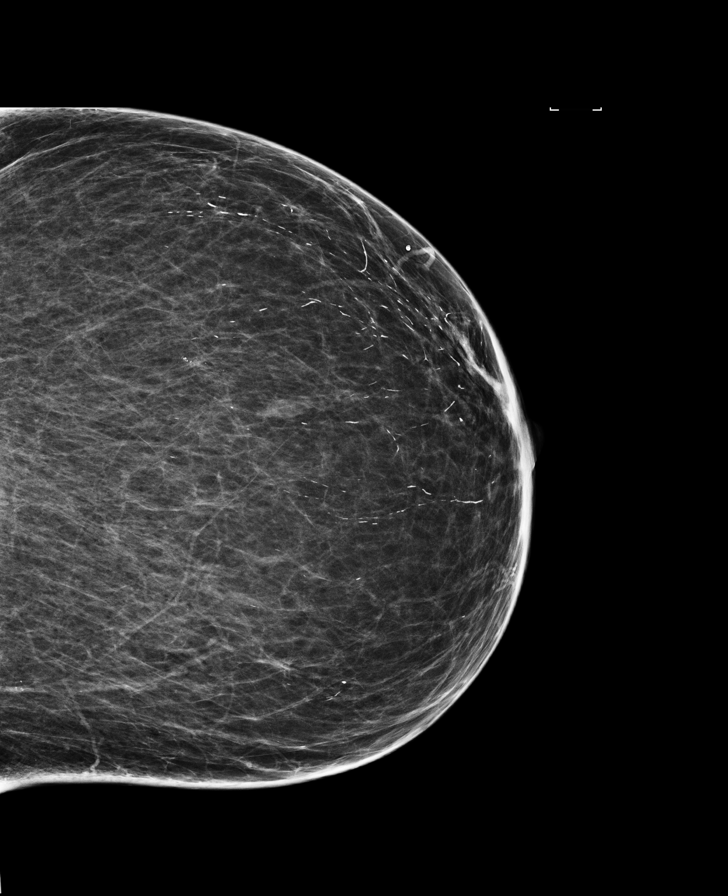

[L CC synth-2D]
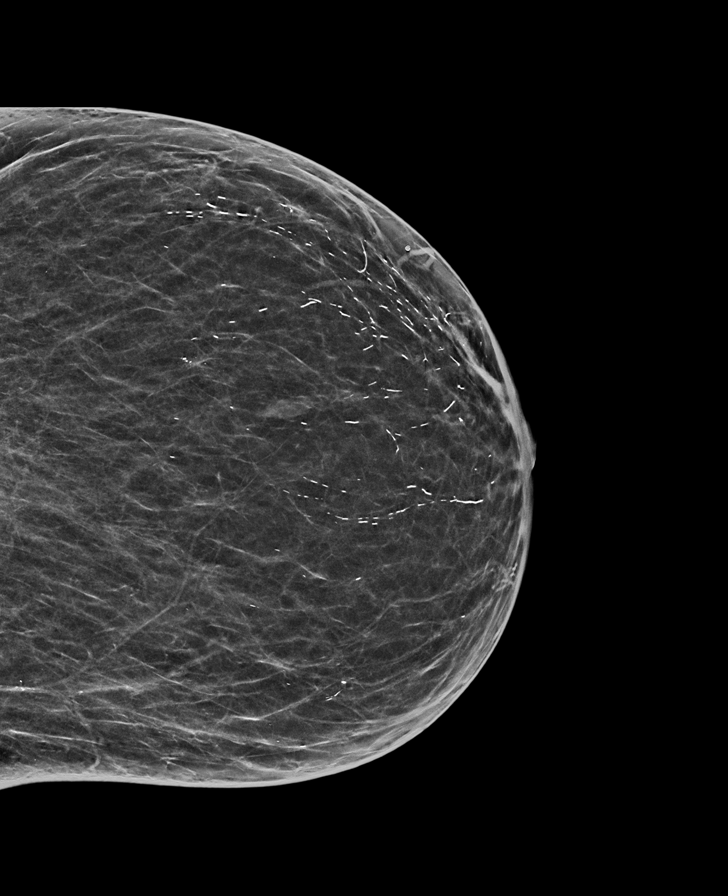

[R CC (2 of 2)]
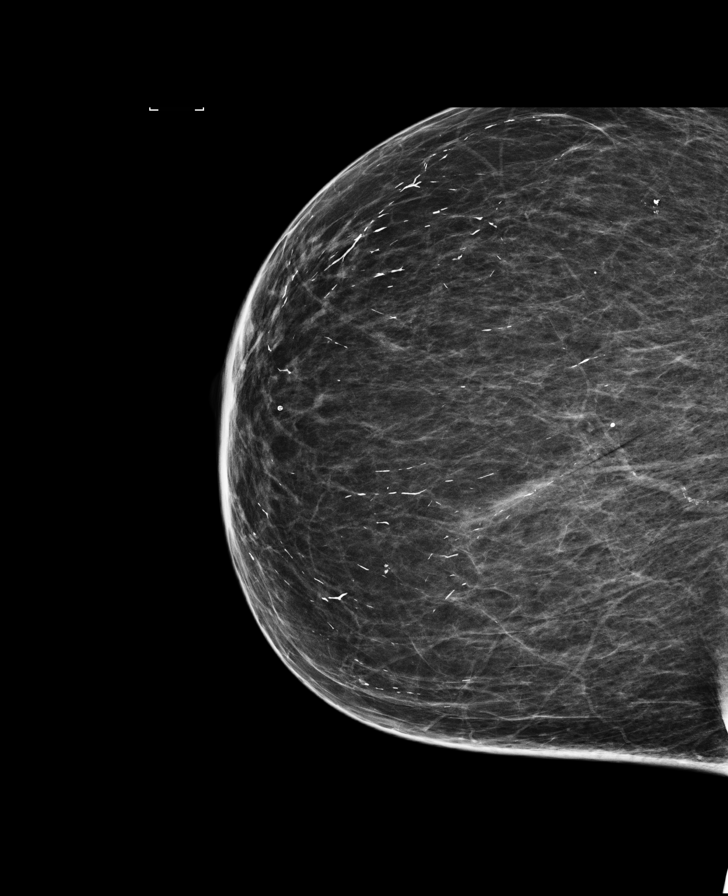

[L MLO]
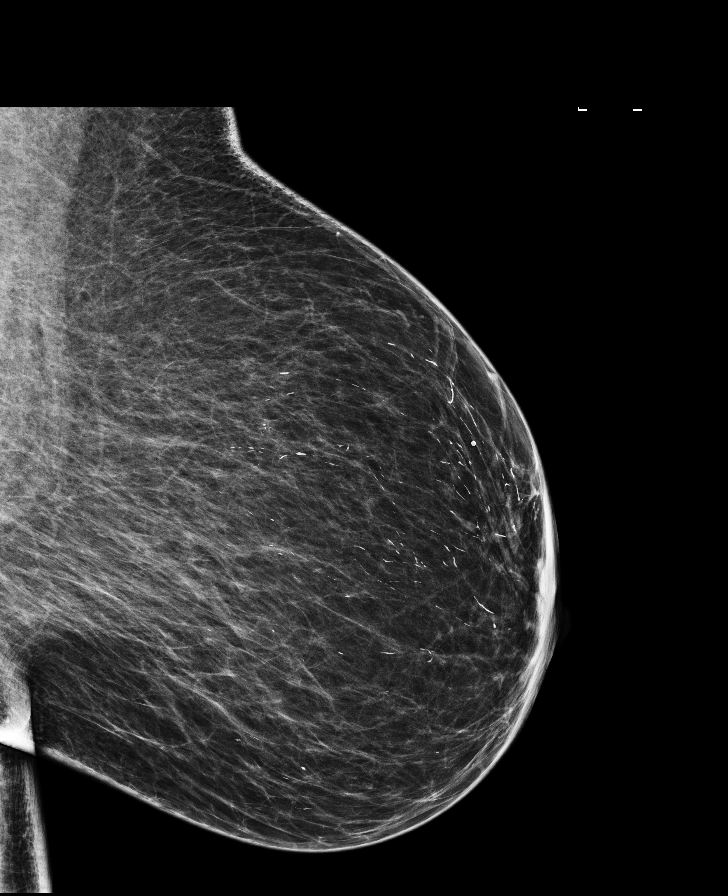

[R MLO synth-2D]
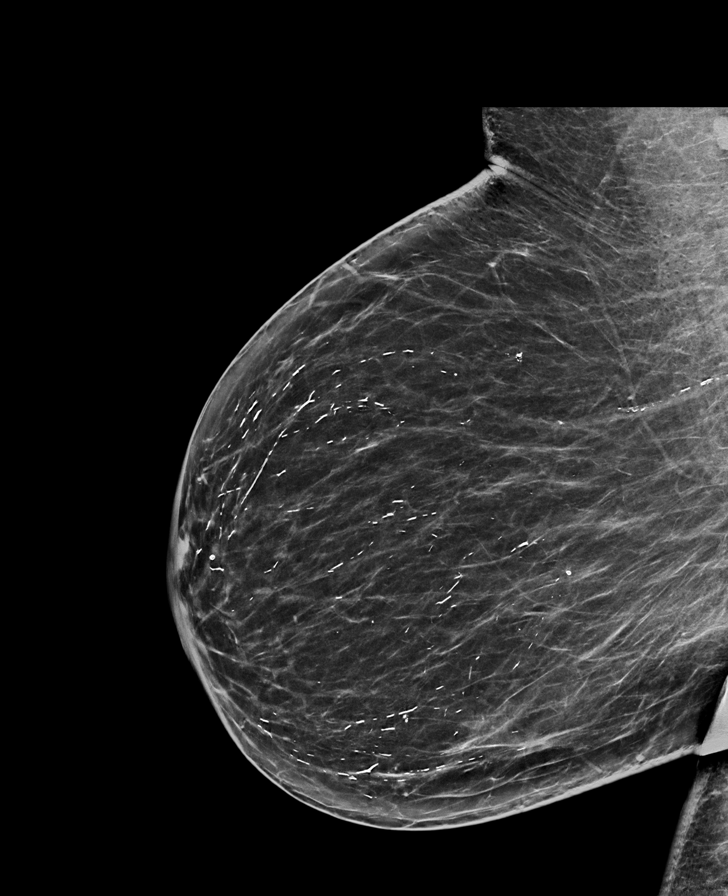

[8 of 29 positions shown; findings below may reference images not displayed]

ACR Breast Density Category b: There are scattered areas of
fibroglandular density.
FINDINGS: There are no findings suspicious for malignancy. Images were
processed with CAD.
IMPRESSION: No mammographic evidence of malignancy. A result letter of this
screening mammogram will be mailed directly to the patient.

RECOMMENDATION:
Screening mammogram in one year. (Code:[33])

BI-RADS CATEGORY  1: Negative.

## 2018-01-28 NOTE — Progress Notes (Deleted)
Cardiology Office Note  Date:  01/28/2018   ID:  Carrie Kane, DOB 1941/04/29, MRN [id number]  PCP:  Cletis Athens, MD   No chief complaint on file.   HPI:  Carrie Kane is a pleasant 77 year old  with past medical history of Diabetes, insulin-dependent Hypertension Worsening chest pain consistent with unstable angina Cardiac catheterization showing severe left main disease, three-vessel disease Coronary artery bypass grafting x307/23/2018 (left internal mammary artery to left anterior descending artery, saphenous vein graft to diagonal, saphenous vein graft to the obtuse marginal). Who presents for follow-up of her coronary disease, s/p CABG  Doing cardiac rehab, feels well denies any significant shortness of breath or on exertion PreviousLabs reviewed HBA1C 8.4 Total chol 155 LDL 81  She has changed her diet, doing low carbohydrates She is concerned about high aspirin dose, risk of prior bleeding to her eyes  EKG personally reviewed by myself on todays visit Shows normal sinus rhythm rate 61 bpm left anterior fascicular block ST abnormality 1 and aVL  Previous cardiac catheterization results discussed with her  LM lesion, 80 %stenosed.  Ost Cx to Prox Cx lesion, 95 %stenosed.  Mid Cx lesion, 70 %stenosed.  Mid LAD lesion, 30 %stenosed.  Prox LAD lesion, 80 %stenosed.  There is mild left ventricular systolic dysfunction.  LV end diastolic pressure is moderately elevated.  The left ventricular ejection fraction is 45-50% by visual estimate.   1. Severe heavily calcified calcified left main stenosis extending into the ostium of the left circumflex with significant proximal LAD stenosis. Diffuse diabetic branch disease. 2. Mildly reduced LV systolic function with an EF of 45-50% with moderate mid to distal anterior and apical hypokinesis. 3. Severely elevated systemic hypertension with moderately elevated left ventricular end-diastolic pressure.  Echocardiogram done  through primary care showing ejection fraction 45%, anteroseptal wall dyskinesia normal left atrial size, calcified mitral valve, diastolic dysfunction    PMH:   has a past medical history of CAD (coronary artery disease), Diastolic dysfunction, Hyperlipidemia, Hypertensive heart disease, and Insulin dependent diabetes mellitus ([place]).  PSH:    Past Surgical History:  Procedure Laterality Date  . ABDOMINAL HYSTERECTOMY    . CORONARY ARTERY BYPASS GRAFT N/A 05/09/2017   Procedure: CORONARY ARTERY BYPASS GRAFTING (CABG) x 3 using left internal mammary artery and right greater saphenous vein harvested endoscopically;  Surgeon: Ivin Poot, MD;  Location: [facility];  Service: Open Heart Surgery;  Laterality: N/A;  . INTRAOPERATIVE TRANSESOPHAGEAL ECHOCARDIOGRAM N/A 05/09/2017   Procedure: INTRAOPERATIVE TRANSESOPHAGEAL ECHOCARDIOGRAM;  Surgeon: Ivin Poot, MD;  Location: [facility];  Service: Open Heart Surgery;  Laterality: N/A;  . LEFT HEART CATH AND CORONARY ANGIOGRAPHY N/A 05/02/2017   Procedure: Left Heart Cath and Coronary Angiography;  Surgeon: Wellington Hampshire, MD;  Location: [facility];  Service: Cardiovascular;  Laterality: N/A;    Current Outpatient Medications  Medication Sig Dispense Refill  . alum & mag hydroxide-simeth (MAALOX/MYLANTA) 200-200-20 MG/5ML suspension Take 15 mLs by mouth every 6 (six) hours as needed for indigestion or heartburn.    Marland Kitchen aspirin EC 81 MG tablet Take 1 tablet (81 mg total) by mouth daily. 90 tablet 3  . atorvastatin (LIPITOR) 40 MG tablet Take 1 tablet (40 mg total) by mouth daily at 6 PM. 90 tablet 1  . benazepril-hydrochlorthiazide (LOTENSIN HCT) 20-12.5 MG per tablet Take 1 tablet by mouth 2 (two) times daily.     . Chlorpheniramine Maleate (ALLERGY PO) Take 1 tablet by mouth daily as needed (allergies).    Marland Kitchen  cholecalciferol (VITAMIN D) 1000 units tablet Take 2,000 Units by mouth daily at 12 noon.     . cloNIDine (CATAPRES) 0.2 MG tablet  Take 1 tablet (0.2 mg total) by mouth 2 (two) times daily. 90 tablet 1  . clopidogrel (PLAVIX) 75 MG tablet Take 1 tablet (75 mg total) by mouth daily. 90 tablet 3  . insulin glargine (LANTUS) 100 UNIT/ML injection Inject 45 Units into the skin at bedtime.    . insulin lispro (HUMALOG) 100 UNIT/ML injection Inject 12-14 Units into the skin 2 (two) times daily. 12 units with lunch and 14 units with dinner    . insulin lispro protamine-lispro (HUMALOG 75/25 MIX) (75-25) 100 UNIT/ML SUSP injection Inject 19-38 Units into the skin daily with breakfast.    . levothyroxine (SYNTHROID, LEVOTHROID) 100 MCG tablet Take 100 mcg by mouth daily before breakfast.    . metoprolol tartrate (LOPRESSOR) 25 MG tablet Take 1 tablet (25 mg total) 2 (two) times daily by mouth. 180 tablet 3  . Multiple Vitamin (MULTIVITAMIN) capsule Take 1 capsule by mouth daily at 12 noon.     . nitroGLYCERIN (NITROSTAT) 0.4 MG SL tablet Place 1 tablet (0.4 mg total) under the tongue every 5 (five) minutes as needed for chest pain. 30 tablet 12  . Polyethyl Glycol-Propyl Glycol (SYSTANE OP) Apply 1 drop to eye as needed (dry eyes).     No current facility-administered medications for this visit.      Allergies:   Flexeril [cyclobenzaprine]   Social History:  The patient  reports that she has never smoked. She has never used smokeless tobacco. She reports that she does not drink alcohol or use drugs.   Family History:   family history includes Breast cancer in her maternal aunt.    Review of Systems: Review of Systems  Constitutional: Negative.   Respiratory: Negative.   Cardiovascular: Negative.   Gastrointestinal: Negative.   Musculoskeletal: Negative.   Neurological: Negative.   Psychiatric/Behavioral: Negative.   All other systems reviewed and are negative.    PHYSICAL EXAM: VS:  There were no vitals taken for this visit. , BMI There is no height or weight on file to calculate BMI. GEN: Well nourished, well  developed, in no acute distress  HEENT: normal  Neck: no JVD, + carotid bruits on the left, no masses Cardiac: RRR; no murmurs, rubs, or gallops,no edema  Respiratory:  clear to auscultation bilaterally, normal work of breathing GI: soft, nontender, nondistended, + BS MS: no deformity or atrophy , Well healed mediastinal scar Skin: warm and dry, no rash Neuro:  Strength and sensation are intact Psych: euthymic mood, full affect    Recent Labs: 05/03/2017: TSH 0.372 05/10/2017: Magnesium 2.3 05/13/2017: Hemoglobin 10.3; Platelets 198 06/03/2017: BUN 21; Creatinine, Ser 1.07; Potassium 4.3; Sodium 138    Lipid Panel Lab Results  Component Value Date   CHOL 155 05/03/2017   HDL 55 05/03/2017   LDLCALC 81 05/03/2017   TRIG 93 05/03/2017      Wt Readings from Last 3 Encounters:  08/11/17 180 lb 4.8 oz (81.8 kg)  08/02/17 179 lb (81.2 kg)  06/15/17 184 lb (83.5 kg)       ASSESSMENT AND PLAN:  CAD, s/p CABG, Stable angina decrease aspirin dosing, start low-dose Plavix given left main disease, severe three-vessel disease in a diabetic  Bruit - Ultrasound 05/05/2017 less than 39% bilateral disease  Diabetes mellitus type 2, insulin dependent (HCC) We have encouraged continued exercise, careful diet management in an  effort to lose weight.  Systolic dysfunction Ejection fraction 45% with anteroseptal wall dyskinesia based on outside echocardiogram Now status post CABG  Hyperlipidemia On Lipitor 40 mg daily  PAD Lower extremity arterial Doppler showing moderate reduction arterial flow at rest on the right, noncompressible vessels on the left    Total encounter time more than 25 minutes  Greater than 50% was spent in counseling and coordination of care with the patient   Disposition:   F/U  6 months   No orders of the defined types were placed in this encounter.    Signed, Esmond Plants, M.D., Ph.D. 01/28/2018  [place],  [place]

## 2018-01-31 ENCOUNTER — Ambulatory Visit: Payer: Medicare Other | Admitting: Cardiovascular Disease

## 2018-02-07 NOTE — Progress Notes (Signed)
Cardiology Office Note  Date:  02/08/2018   ID:  Carrie Kane, DOB 1941/06/01, MRN [id number]  PCP:  Cletis Athens, MD   Chief Complaint  Patient presents with  . OTHER    6 month f/u no complaints today. Meds reviewed verbally with pt.    HPI:  [name] is a pleasant 77 year old  with past medical history of Diabetes, insulin-dependent nonsmoker Hypertension Worsening chest pain consistent with unstable angina Cardiac catheterization showing severe left main disease, three-vessel disease Coronary artery bypass grafting x3  05/09/2017 (left internal mammary artery to left anterior descending artery, saphenous vein graft to diagonal, saphenous vein graft to the obtuse marginal). Who presents for follow-up of her coronary disease, s/p CABG  In follow-up today she reports that she is doing very well Goes to the Gym 3x a week does aerobic Active outside of the gym, good ADLs Denies any symptoms of shortness of breath or chest pain on exertion  Previous lab work HBA1C 8.4 Total chol 155 LDL 81 in 04/2017 No new labs available  She has been watching her diet trying to get her sugars down Records requested from primary care and reviewed   EKG personally reviewed by myself on todays visit Shows normal sinus rhythm rate 60 bpm left anterior fascicular block ST abnormality 1 and aVL Frequent PVCs  Previous cardiac catheterization results discussed with her  LM lesion, 80 %stenosed.  Ost Cx to Prox Cx lesion, 95 %stenosed.  Mid Cx lesion, 70 %stenosed.  Mid LAD lesion, 30 %stenosed.  Prox LAD lesion, 80 %stenosed.  There is mild left ventricular systolic dysfunction.  LV end diastolic pressure is moderately elevated.  The left ventricular ejection fraction is 45-50% by visual estimate.   1. Severe heavily calcified calcified left main stenosis extending into the ostium of the left circumflex with significant proximal LAD stenosis. Diffuse diabetic branch disease. 2.  Mildly reduced LV systolic function with an EF of 45-50% with moderate mid to distal anterior and apical hypokinesis. 3. Severely elevated systemic hypertension with moderately elevated left ventricular end-diastolic pressure.  Echocardiogram done through primary care showing ejection fraction 45%, anteroseptal wall dyskinesia normal left atrial size, calcified mitral valve, diastolic dysfunction    PMH:   has a past medical history of CAD (coronary artery disease), Diastolic dysfunction, Hyperlipidemia, Hypertensive heart disease, and Insulin dependent diabetes mellitus ([place]).  PSH:    Past Surgical History:  Procedure Laterality Date  . ABDOMINAL HYSTERECTOMY    . CORONARY ARTERY BYPASS GRAFT N/A 05/09/2017   Procedure: CORONARY ARTERY BYPASS GRAFTING (CABG) x 3 using left internal mammary artery and right greater saphenous vein harvested endoscopically;  Surgeon: Ivin Poot, MD;  Location: [facility];  Service: Open Heart Surgery;  Laterality: N/A;  . INTRAOPERATIVE TRANSESOPHAGEAL ECHOCARDIOGRAM N/A 05/09/2017   Procedure: INTRAOPERATIVE TRANSESOPHAGEAL ECHOCARDIOGRAM;  Surgeon: Ivin Poot, MD;  Location: [facility];  Service: Open Heart Surgery;  Laterality: N/A;  . LEFT HEART CATH AND CORONARY ANGIOGRAPHY N/A 05/02/2017   Procedure: Left Heart Cath and Coronary Angiography;  Surgeon: Wellington Hampshire, MD;  Location: [facility];  Service: Cardiovascular;  Laterality: N/A;    Current Outpatient Medications  Medication Sig Dispense Refill  . alum & mag hydroxide-simeth (MAALOX/MYLANTA) 200-200-20 MG/5ML suspension Take 15 mLs by mouth every 6 (six) hours as needed for indigestion or heartburn.    Marland Kitchen aspirin EC 81 MG tablet Take 1 tablet (81 mg total) by mouth daily. 90 tablet 3  . atorvastatin (LIPITOR)  40 MG tablet Take 1 tablet (40 mg total) by mouth daily at 6 PM. 90 tablet 1  . benazepril-hydrochlorthiazide (LOTENSIN HCT) 20-12.5 MG per tablet Take 1 tablet by mouth 2 (two)  times daily.     . Chlorpheniramine Maleate (ALLERGY PO) Take 1 tablet by mouth daily as needed (allergies).    . cholecalciferol (VITAMIN D) 1000 units tablet Take 2,000 Units by mouth daily at 12 noon.     . cloNIDine (CATAPRES) 0.2 MG tablet Take 1 tablet (0.2 mg total) by mouth 2 (two) times daily. 90 tablet 1  . clopidogrel (PLAVIX) 75 MG tablet Take 1 tablet (75 mg total) by mouth daily. 90 tablet 3  . insulin glargine (LANTUS) 100 UNIT/ML injection Inject 45 Units into the skin at bedtime.    . insulin lispro (HUMALOG) 100 UNIT/ML injection Inject 12-14 Units into the skin 2 (two) times daily. 12 units with lunch and 14 units with dinner    . insulin lispro protamine-lispro (HUMALOG 75/25 MIX) (75-25) 100 UNIT/ML SUSP injection Inject 19-38 Units into the skin daily with breakfast.    . levothyroxine (SYNTHROID, LEVOTHROID) 100 MCG tablet Take 100 mcg by mouth daily before breakfast.    . metoprolol tartrate (LOPRESSOR) 25 MG tablet Take 1 tablet (25 mg total) 2 (two) times daily by mouth. 180 tablet 3  . Multiple Vitamin (MULTIVITAMIN) capsule Take 1 capsule by mouth daily at 12 noon.     . nitroGLYCERIN (NITROSTAT) 0.4 MG SL tablet Place 1 tablet (0.4 mg total) under the tongue every 5 (five) minutes as needed for chest pain. 30 tablet 12  . Polyethyl Glycol-Propyl Glycol (SYSTANE OP) Apply 1 drop to eye as needed (dry eyes).     No current facility-administered medications for this visit.      Allergies:   Flexeril [cyclobenzaprine]   Social History:  The patient  reports that she has never smoked. She has never used smokeless tobacco. She reports that she does not drink alcohol or use drugs.   Family History:   family history includes Breast cancer in her maternal aunt.    Review of Systems: Review of Systems  Constitutional: Negative.   Respiratory: Negative.   Cardiovascular: Negative.   Gastrointestinal: Negative.   Musculoskeletal: Negative.   Neurological: Negative.    Psychiatric/Behavioral: Negative.   All other systems reviewed and are negative.    PHYSICAL EXAM: VS:  BP (!) 110/54 (BP Location: Left Arm, Patient Position: Sitting, Cuff Size: Normal)   Pulse 60   Ht 5' 8.5" (1.74 m)   Wt 178 lb 8 oz (81 kg)   BMI 26.75 kg/m  , BMI Body mass index is 26.75 kg/m. Constitutional:  oriented to person, place, and time. No distress.  HENT:  Head: Normocephalic and atraumatic.  Eyes:  no discharge. No scleral icterus.  Neck: Normal range of motion. Neck supple. No JVD present.  Cardiovascular: Normal rate, regular rhythm, normal heart sounds and intact distal pulses. Exam reveals no gallop and no friction rub. No edema No murmur heard. Pulmonary/Chest: Effort normal and breath sounds normal. No stridor. No respiratory distress.  no wheezes.  no rales.  no tenderness.  Abdominal: Soft.  no distension.  no tenderness.  Musculoskeletal: Normal range of motion.  no  tenderness or deformity.  Neurological:  normal muscle tone. Coordination normal. No atrophy Skin: Skin is warm and dry. No rash noted. not diaphoretic.  Psychiatric:  normal mood and affect. behavior is normal. Thought content normal.  Recent Labs: 05/03/2017: TSH 0.372 05/10/2017: Magnesium 2.3 05/13/2017: Hemoglobin 10.3; Platelets 198 06/03/2017: BUN 21; Creatinine, Ser 1.07; Potassium 4.3; Sodium 138    Lipid Panel Lab Results  Component Value Date   CHOL 155 05/03/2017   HDL 55 05/03/2017   LDLCALC 81 05/03/2017   TRIG 93 05/03/2017      Wt Readings from Last 3 Encounters:  02/08/18 178 lb 8 oz (81 kg)  08/11/17 180 lb 4.8 oz (81.8 kg)  08/02/17 179 lb (81.2 kg)       ASSESSMENT AND PLAN:  CAD, s/p CABG, Stable angina She is on aspirin and Plavix given left main disease, severe three-vessel disease  Diabetic We will continue aggressive lipid management goal LDL less than 70 Reports she has lab work through primary care  Bruit  Ultrasound 05/05/2017 less than  39% bilateral disease No further work-up needed at this time  Diabetes mellitus type 2, insulin dependent ([place]) She is working on her exercise, lifestyle modification Weight stable  Hyperlipidemia On Lipitor 40 mg daily could add Zetia if above goal  PAD Lower extremity arterial Doppler showing moderate reduction arterial flow at rest on the right, noncompressible vessels on the left Denies claudication symptoms, works out at [employer]    Total encounter time more than 25 minutes  Greater than 50% was spent in counseling and coordination of care with the patient   Disposition:   F/U  12 months   Orders Placed This Encounter  Procedures  . EKG 12-Lead     Signed, Esmond Plants, M.D., Ph.D. 02/08/2018  [place], [place]

## 2018-02-08 ENCOUNTER — Ambulatory Visit: Payer: Medicare Other | Admitting: Cardiovascular Disease

## 2018-02-08 ENCOUNTER — Encounter: Payer: Self-pay | Admitting: Cardiovascular Disease

## 2018-02-08 VITALS — BP 110/54 | HR 60 | Ht 68.5 in | Wt 178.5 lb

## 2018-02-08 DIAGNOSIS — I25118 Atherosclerotic heart disease of native coronary artery with other forms of angina pectoris: Secondary | ICD-10-CM | POA: Diagnosis not present

## 2018-02-08 DIAGNOSIS — Z794 Long term (current) use of insulin: Secondary | ICD-10-CM

## 2018-02-08 DIAGNOSIS — I739 Peripheral vascular disease, unspecified: Secondary | ICD-10-CM | POA: Diagnosis not present

## 2018-02-08 DIAGNOSIS — E119 Type 2 diabetes mellitus without complications: Secondary | ICD-10-CM | POA: Diagnosis not present

## 2018-02-08 DIAGNOSIS — E785 Hyperlipidemia, unspecified: Secondary | ICD-10-CM

## 2018-02-08 DIAGNOSIS — I10 Essential (primary) hypertension: Secondary | ICD-10-CM | POA: Diagnosis not present

## 2018-02-08 DIAGNOSIS — Z951 Presence of aortocoronary bypass graft: Secondary | ICD-10-CM

## 2018-02-08 NOTE — Patient Instructions (Addendum)
Goal LDL <70, preferably 60 Call if numbers are high   Medication Instructions:   No medication changes made  Labwork:  No new labs needed  Testing/Procedures:  No further testing at this time   Follow-Up: It was a pleasure seeing you in the office today. Please call us if you have new issues that need to be addressed before your next appt.  [phone]  Your physician wants you to follow-up in: 12 months.  You will receive a reminder letter in the mail two months in advance. If you don't receive a letter, please call our office to schedule the follow-up appointment.  If you need a refill on your cardiac medications before your next appointment, please call your pharmacy.  For educational health videos Log in to : [email or web address] Or : [email or web address], password : [email or web address]

## 2018-07-24 ENCOUNTER — Telehealth: Payer: Self-pay

## 2018-07-24 MED ORDER — METOPROLOL TARTRATE 25 MG PO TABS: 25.0000 mg | ORAL_TABLET | Freq: Two times a day (BID) | ORAL | 3 refills | Status: DC

## 2018-07-24 NOTE — Telephone Encounter (Signed)
*  STAT* If patient is at the pharmacy, call can be transferred to refill team.   1. Which medications need to be refilled? (please list name of each medication and dose if known) Metoprolol  2. Which pharmacy/location (including street and city if local pharmacy) is medication to be sent to? [facility] [address]  3. Do they need a 30 day or 90 day supply?[place]

## 2018-07-27 NOTE — Telephone Encounter (Signed)
°*  STAT* If patient is at the pharmacy, call can be transferred to refill team.   1. Which medications need to be refilled? (please list name of each medication and dose if known) Metoprolol 25 MG - 1 tablet 2 times daily   2. Which pharmacy/location (including street and city if local pharmacy) is medication to be sent to? [facility]  3. Do they need a 30 day or 90 day supply? 90 day

## 2018-07-28 ENCOUNTER — Telehealth: Payer: Self-pay

## 2018-07-28 MED ORDER — METOPROLOL TARTRATE 25 MG PO TABS: 25.0000 mg | ORAL_TABLET | Freq: Two times a day (BID) | ORAL | 3 refills | Status: DC

## 2018-07-28 NOTE — Telephone Encounter (Signed)
Patient called requesting change in Pharmacy Rx for Metoprolol sent to [facility].

## 2018-08-03 ENCOUNTER — Other Ambulatory Visit: Payer: Self-pay | Admitting: Cardiovascular Disease

## 2018-08-04 ENCOUNTER — Other Ambulatory Visit: Payer: Self-pay | Admitting: Internal Medicine

## 2018-08-04 DIAGNOSIS — Z1231 Encounter for screening mammogram for malignant neoplasm of breast: Secondary | ICD-10-CM

## 2018-08-09 ENCOUNTER — Inpatient Hospital Stay: Admission: RE | Admit: 2018-08-09 | Payer: Medicare Other | Source: Ambulatory Visit

## 2018-10-16 ENCOUNTER — Ambulatory Visit
Admission: RE | Admit: 2018-10-16 | Discharge: 2018-10-16 | Disposition: A | Discharge status: Home / Self Care | Payer: Medicare Other | Source: Ambulatory Visit | Attending: Internal Medicine | Admitting: Internal Medicine

## 2018-10-16 ENCOUNTER — Encounter (INDEPENDENT_AMBULATORY_CARE_PROVIDER_SITE_OTHER): Payer: Self-pay

## 2018-10-16 DIAGNOSIS — Z1231 Encounter for screening mammogram for malignant neoplasm of breast: Secondary | ICD-10-CM | POA: Insufficient documentation

## 2018-10-16 IMAGING — MG DIGITAL SCREENING BILATERAL MAMMOGRAM WITH TOMO AND CAD
8 series · 8 of 24 positions shown · non-contrast
Comparison: Previous exam(s).

CLINICAL DATA: Screening.

EXAM:
DIGITAL SCREENING BILATERAL MAMMOGRAM WITH TOMO AND CAD

[R CC synth-2D]
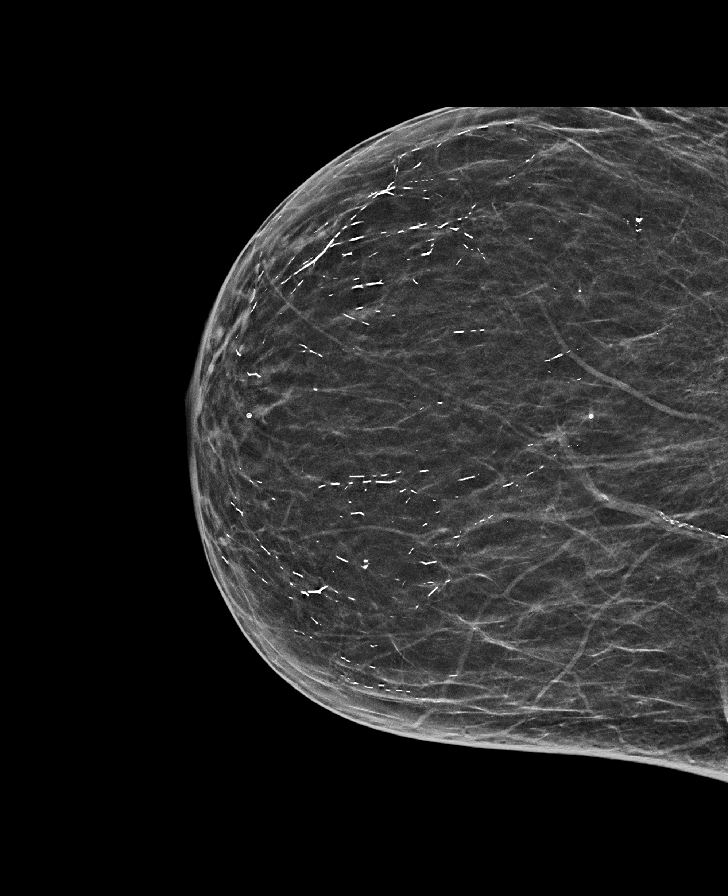

[L CC synth-2D]
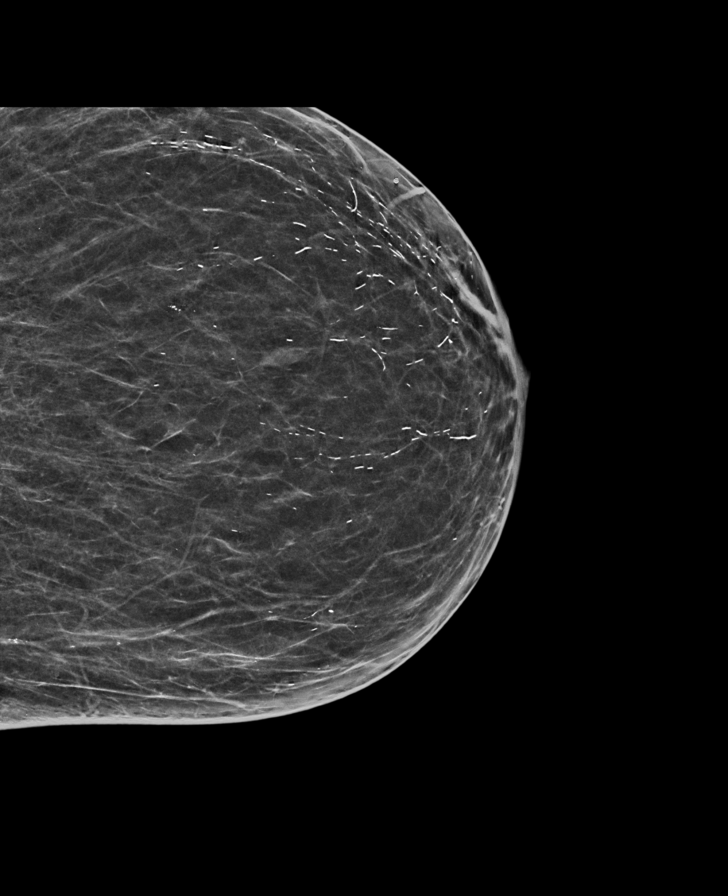

[R MLO synth-2D]
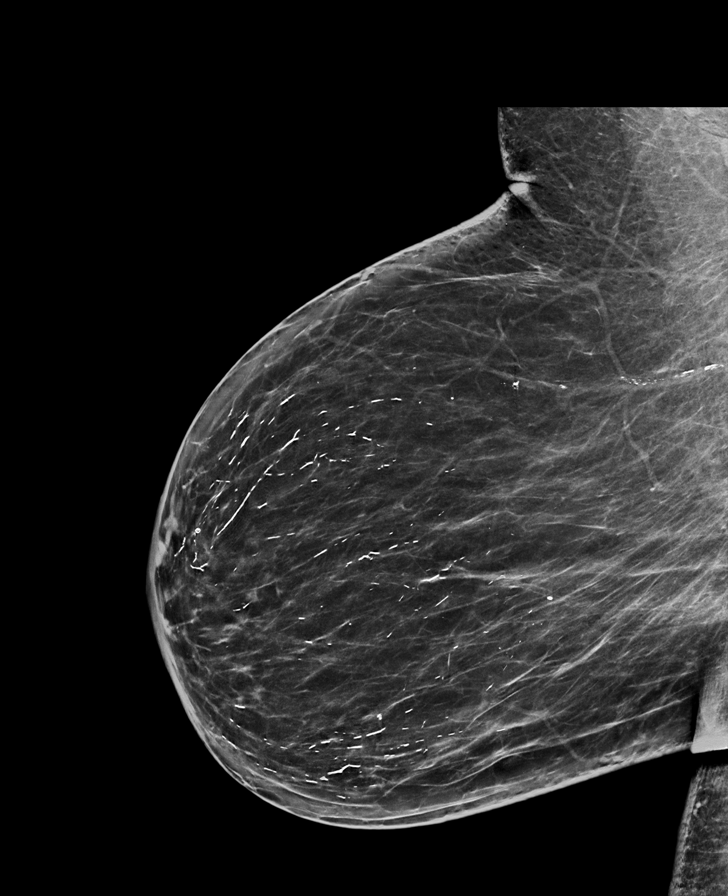

[L MLO synth-2D]
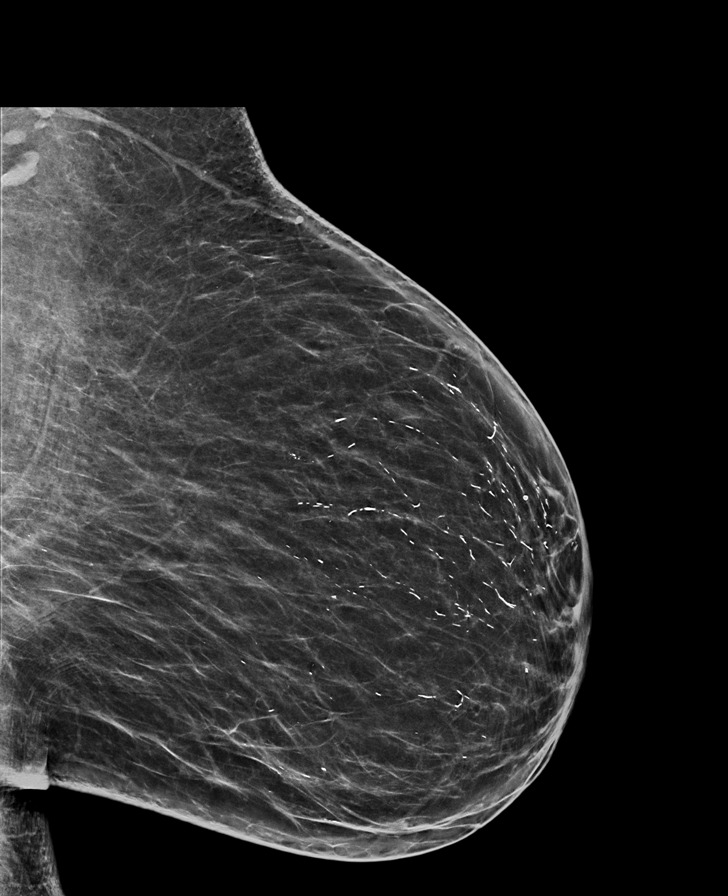

[R MLO tomo · tomo slice 37/74.0]
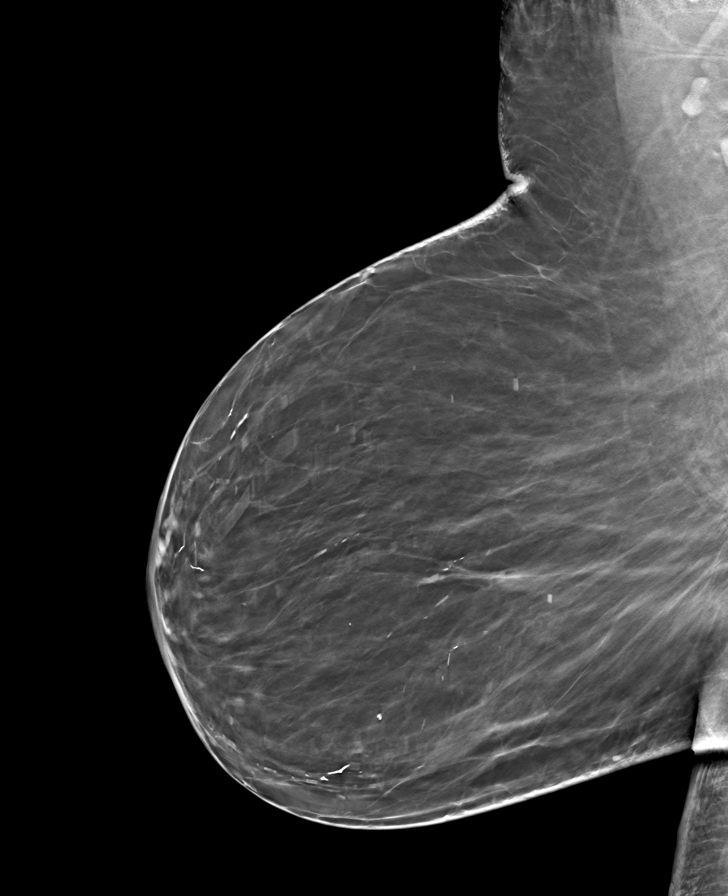

[R CC tomo · tomo slice 29/57.0]
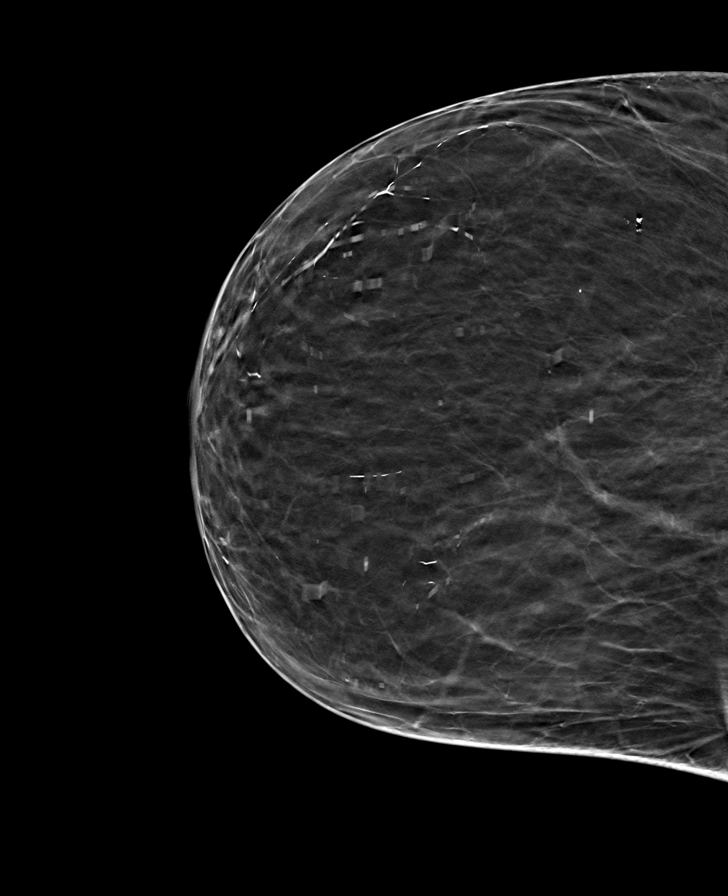

[L CC tomo · tomo slice 31/61.0]
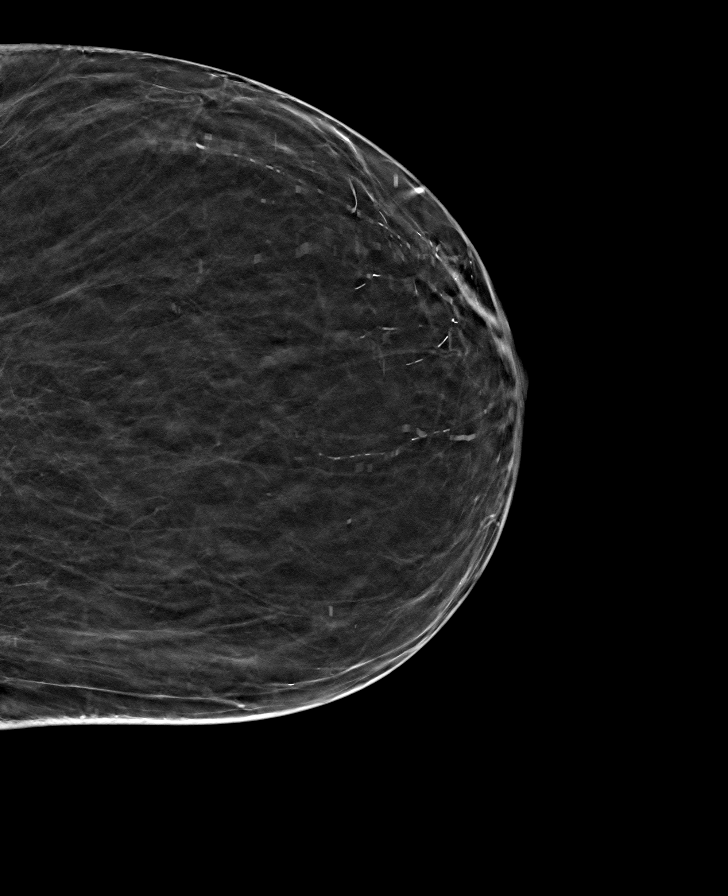

[L MLO tomo · tomo slice 37/73.0]
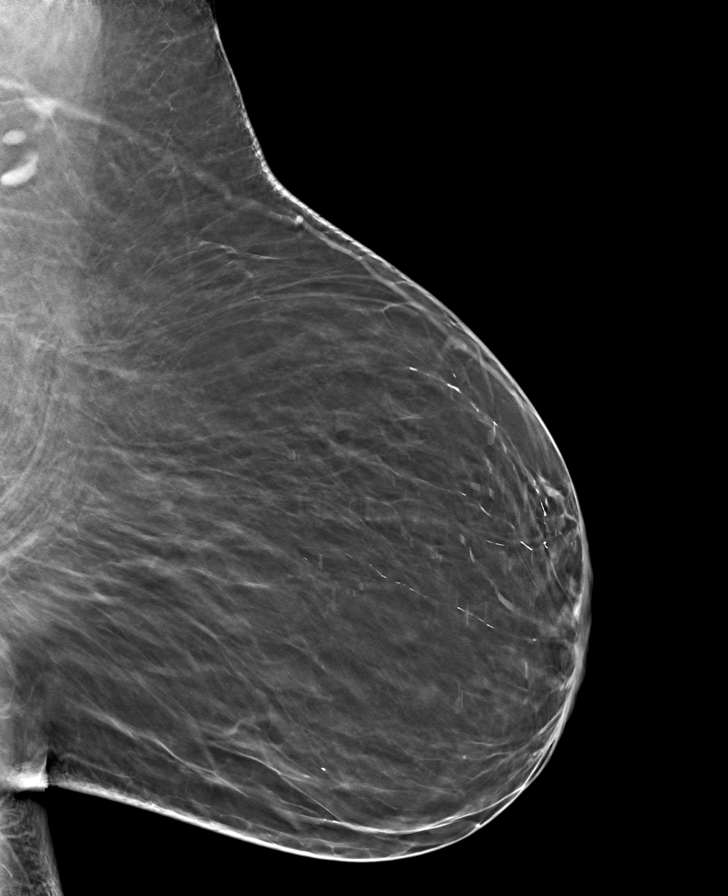

[8 of 24 positions shown; findings below may reference images not displayed]

ACR Breast Density Category b: There are scattered areas of
fibroglandular density.
FINDINGS: There are no findings suspicious for malignancy. Images were
processed with CAD.
IMPRESSION: No mammographic evidence of malignancy. A result letter of this
screening mammogram will be mailed directly to the patient.

RECOMMENDATION:
Screening mammogram in one year. (Code:[TQ])

BI-RADS CATEGORY  1: Negative.

## 2019-01-17 ENCOUNTER — Telehealth: Payer: Self-pay

## 2019-01-17 NOTE — Telephone Encounter (Signed)
Virtual Visit Pre-Appointment Phone Call  Steps For Call:  1. Confirm consent - "In the setting of the current Covid19 crisis, you are scheduled for a TELEPHONE  visit with your provider on 02/12/2019 at 2:00pm.  Just as we do with many in-office visits, in order for you to participate in this visit, we must obtain consent.  If you'd like, I can send this to your mychart (if signed up) or email for you to review.  Otherwise, I can obtain your verbal consent now.  All virtual visits are billed to your insurance company just like a normal visit would be.  By agreeing to a virtual visit, we'd like you to understand that the technology does not allow for your provider to perform an examination, and thus may limit your provider's ability to fully assess your condition.  Finally, though the technology is pretty good, we cannot assure that it will always work on either your or our end, and in the setting of a video visit, we may have to convert it to a phone-only visit.  In either situation, we cannot ensure that we have a secure connection.  Are you willing to proceed?"  2. Give patient instructions for WebEx download to smartphone as below if video visit  3. Advise patient to be prepared with any vital sign or heart rhythm information, their current medicines, and a piece of paper and pen handy for any instructions they may receive the day of their visit  4. Inform patient they will receive a phone call 15 minutes prior to their appointment time (may be from unknown caller ID) so they should be prepared to answer  5. Confirm that appointment type is correct in Epic appointment notes (video vs telephone)    TELEPHONE CALL NOTE  Carrie Kane has been deemed a candidate for a follow-up tele-health visit to limit community exposure during the Covid-19 pandemic. I spoke with the patient via phone to ensure availability of phone/video source, confirm preferred email & phone number, and discuss  instructions and expectations.  I reminded Carrie Kane to be prepared with any vital sign and/or heart rhythm information that could potentially be obtained via home monitoring, at the time of her visit. I reminded Carrie Kane to expect a phone call at the time of her visit if her visit.  Did the patient verbally acknowledge consent to treatment? [place], [place] 01/17/2019 11:29 AM  CONSENT FOR TELE-HEALTH VISIT - PLEASE REVIEW  I hereby voluntarily request, consent and authorize [facility] and its employed or contracted physicians, physician assistants, nurse practitioners or other licensed health care professionals (the Practitioner), to provide me with telemedicine health care services (the Services") as deemed necessary by the treating Practitioner. I acknowledge and consent to receive the Services by the Practitioner via telemedicine. I understand that the telemedicine visit will involve communicating with the Practitioner through live audiovisual communication technology and the disclosure of certain medical information by electronic transmission. I acknowledge that I have been given the opportunity to request an in-person assessment or other available alternative prior to the telemedicine visit and am voluntarily participating in the telemedicine visit.  I understand that I have the right to withhold or withdraw my consent to the use of telemedicine in the course of my care at any time, without affecting my right to future care or treatment, and that the Practitioner or I may terminate the telemedicine visit at any time. I understand that I have the right  to inspect all information obtained and/or recorded in the course of the telemedicine visit and may receive copies of available information for a reasonable fee.  I understand that some of the potential risks of receiving the Services via telemedicine include:   Delay or interruption in medical evaluation due to  technological equipment failure or disruption;  Information transmitted may not be sufficient (e.g. poor resolution of images) to allow for appropriate medical decision making by the Practitioner; and/or   In rare instances, security protocols could fail, causing a breach of personal health information.  Furthermore, I acknowledge that it is my responsibility to provide information about my medical history, conditions and care that is complete and accurate to the best of my ability. I acknowledge that Practitioner's advice, recommendations, and/or decision may be based on factors not within their control, such as incomplete or inaccurate data provided by me or distortions of diagnostic images or specimens that may result from electronic transmissions. I understand that the practice of medicine is not an exact science and that Practitioner makes no warranties or guarantees regarding treatment outcomes. I acknowledge that I will receive a copy of this consent concurrently upon execution via email to the email address I last provided but may also request a printed copy by calling the office of [facility].    I understand that my insurance will be billed for this visit.   I have read or had this consent read to me.  I understand the contents of this consent, which adequately explains the benefits and risks of the Services being provided via telemedicine.   I have been provided ample opportunity to ask questions regarding this consent and the Services and have had my questions answered to my satisfaction.  I give my informed consent for the services to be provided through the use of telemedicine in my medical care  By participating in this telemedicine visit I agree to the above.

## 2019-02-11 NOTE — Progress Notes (Deleted)
{Choose 1 Note Type (Telehealth Visit or Telephone Visit):[phone]}   I connected with  Carrie Kane on 02/11/19 by a video enabled telemedicine application and verified that I am speaking with the correct person using two identifiers. I discussed the limitations of evaluation and management by telemedicine. The patient expressed understanding and agreed to proceed.   Evaluation Performed:  Follow-up visit  Date:  02/11/2019   ID:  Carrie Kane, DOB [birth year], MRN [id number]  Patient Location:  [address] [address]   Provider location:   [facility], [place] office  PCP:  Cletis Athens, MD  Cardiologist:  Patsy Baltimore   Chief Complaint:      History of Present Illness:    Carrie Kane is a 78 y.o. female who presents via audio/video conferencing for a telehealth visit today.   The patient does not symptoms concerning for COVID-19 infection (fever, chills, cough, or new SHORTNESS OF BREATH).   Patient has a past medical history of Diabetes, insulin-dependent nonsmoker Hypertension Worsening chest pain consistent with unstable angina Cardiac catheterization showing severe left main disease, three-vessel disease Coronary artery bypass grafting x3  05/09/2017 (left internal mammary artery to left anterior descending artery, saphenous vein graft to diagonal, saphenous vein graft to the obtuse marginal). Who presents for follow-up of her coronary disease, s/p CABG  In follow-up today she reports that she is doing very well Goes to the Gym 3x a week does aerobic Active outside of the gym, good ADLs Denies any symptoms of shortness of breath or chest pain on exertion  Previous lab work HBA1C 8.4 Total chol 155 LDL 81 in 04/2017 No new labs available  She has been watching her diet trying to get her sugars down Records requested from primary care and reviewed   EKG personally reviewed by myself on todays visit Shows normal  sinus rhythm rate 60 bpm left anterior fascicular block ST abnormality 1 and aVL Frequent PVCs  Previous cardiac catheterization results discussed with her  LM lesion, 80 %stenosed.  Ost Cx to Prox Cx lesion, 95 %stenosed.  Mid Cx lesion, 70 %stenosed.  Mid LAD lesion, 30 %stenosed.  Prox LAD lesion, 80 %stenosed.  There is mild left ventricular systolic dysfunction.  LV end diastolic pressure is moderately elevated.  The left ventricular ejection fraction is 45-50% by visual estimate.  1. Severe heavily calcified calcified left main stenosis extending into the ostium of the left circumflex with significant proximal LAD stenosis. Diffuse diabetic branch disease. 2. Mildly reduced LV systolic function with an EF of 45-50% with moderate mid to distal anterior and apical hypokinesis. 3. Severely elevated systemic hypertension with moderately elevated left ventricular end-diastolic pressure.  Echocardiogram done through primary care showing ejection fraction 45%, anteroseptal wall dyskinesia normal left atrial size, calcified mitral valve, diastolic dysfunction     Prior CV studies:   The following studies were reviewed today:    Past Medical History:  Diagnosis Date  . CAD (coronary artery disease)    a. 04/2017 Cath: LM 80, LAD 80p, 39m, LCX 95ost, 79m, EF 45-50%; b. 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  . Diastolic dysfunction    a. 04/2017 Echo: EF 55-60%, no rwma, Gr1 DD, mildly dil LA.  Marland Kitchen Hyperlipidemia   . Hypertensive heart disease   . Insulin dependent diabetes mellitus ([place])    Past Surgical History:  Procedure Laterality Date  . ABDOMINAL HYSTERECTOMY    . CORONARY ARTERY BYPASS GRAFT N/A 05/09/2017   Procedure:  CORONARY ARTERY BYPASS GRAFTING (CABG) x 3 using left internal mammary artery and right greater saphenous vein harvested endoscopically;  Surgeon: Ivin Poot, MD;  Location: [facility];  Service: Open Heart Surgery;  Laterality: N/A;  .  INTRAOPERATIVE TRANSESOPHAGEAL ECHOCARDIOGRAM N/A 05/09/2017   Procedure: INTRAOPERATIVE TRANSESOPHAGEAL ECHOCARDIOGRAM;  Surgeon: Ivin Poot, MD;  Location: [facility];  Service: Open Heart Surgery;  Laterality: N/A;  . LEFT HEART CATH AND CORONARY ANGIOGRAPHY N/A 05/02/2017   Procedure: Left Heart Cath and Coronary Angiography;  Surgeon: Wellington Hampshire, MD;  Location: [facility];  Service: Cardiovascular;  Laterality: N/A;     No outpatient medications have been marked as taking for the 02/12/19 encounter (Appointment) with Minna Merritts, MD.     Allergies:   Flexeril [cyclobenzaprine]   Social History   Tobacco Use  . Smoking status: Never Smoker  . Smokeless tobacco: Never Used  Substance Use Topics  . Alcohol use: No  . Drug use: No     Current Outpatient Medications on File Prior to Visit  Medication Sig Dispense Refill  . alum & mag hydroxide-simeth (MAALOX/MYLANTA) 200-200-20 MG/5ML suspension Take 15 mLs by mouth every 6 (six) hours as needed for indigestion or heartburn.    Marland Kitchen aspirin EC 81 MG tablet Take 1 tablet (81 mg total) by mouth daily. 90 tablet 3  . atorvastatin (LIPITOR) 40 MG tablet Take 1 tablet (40 mg total) by mouth daily at 6 PM. 90 tablet 1  . benazepril-hydrochlorthiazide (LOTENSIN HCT) 20-12.5 MG per tablet Take 1 tablet by mouth 2 (two) times daily.     . Chlorpheniramine Maleate (ALLERGY PO) Take 1 tablet by mouth daily as needed (allergies).    . cholecalciferol (VITAMIN D) 1000 units tablet Take 2,000 Units by mouth daily at 12 noon.     . cloNIDine (CATAPRES) 0.2 MG tablet Take 1 tablet (0.2 mg total) by mouth 2 (two) times daily. 90 tablet 1  . clopidogrel (PLAVIX) 75 MG tablet TAKE 1 TABLET DAILY 90 tablet 4  . insulin glargine (LANTUS) 100 UNIT/ML injection Inject 45 Units into the skin at bedtime.    . insulin lispro (HUMALOG) 100 UNIT/ML injection Inject 12-14 Units into the skin 2 (two) times daily. 12 units with lunch and 14 units  with dinner    . insulin lispro protamine-lispro (HUMALOG 75/25 MIX) (75-25) 100 UNIT/ML SUSP injection Inject 19-38 Units into the skin daily with breakfast.    . levothyroxine (SYNTHROID, LEVOTHROID) 100 MCG tablet Take 100 mcg by mouth daily before breakfast.    . metoprolol tartrate (LOPRESSOR) 25 MG tablet Take 1 tablet (25 mg total) by mouth 2 (two) times daily. 180 tablet 3  . Multiple Vitamin (MULTIVITAMIN) capsule Take 1 capsule by mouth daily at 12 noon.     . nitroGLYCERIN (NITROSTAT) 0.4 MG SL tablet Place 1 tablet (0.4 mg total) under the tongue every 5 (five) minutes as needed for chest pain. 30 tablet 12  . Polyethyl Glycol-Propyl Glycol (SYSTANE OP) Apply 1 drop to eye as needed (dry eyes).     No current facility-administered medications on file prior to visit.      Family Hx: The patient's family history includes Breast cancer in her maternal aunt.  ROS:   Please see the history of present illness.    ROS    Labs/Other Tests and Data Reviewed:    Recent Labs: No results found for requested labs within last 8760 hours.   Recent Lipid Panel  Lab Results  Component Value Date/Time   CHOL 155 05/03/2017 04:54 AM   TRIG 93 05/03/2017 04:54 AM   HDL 55 05/03/2017 04:54 AM   CHOLHDL 2.8 05/03/2017 04:54 AM   LDLCALC 81 05/03/2017 04:54 AM    Wt Readings from Last 3 Encounters:  02/08/18 178 lb 8 oz (81 kg)  08/11/17 180 lb 4.8 oz (81.8 kg)  08/02/17 179 lb (81.2 kg)     Exam:    Vital Signs: Vital signs may also be detailed in the HPI There were no vitals taken for this visit.  Wt Readings from Last 3 Encounters:  02/08/18 178 lb 8 oz (81 kg)  08/11/17 180 lb 4.8 oz (81.8 kg)  08/02/17 179 lb (81.2 kg)   Temp Readings from Last 3 Encounters:  05/15/17 98.7 F (37.1 C) (Oral)  05/02/17 98.4 F (36.9 C) (Oral)  05/07/16 98 F (36.7 C) (Oral)   BP Readings from Last 3 Encounters:  02/08/18 (!) 110/54  08/02/17 138/60  06/15/17 117/69   Pulse  Readings from Last 3 Encounters:  02/08/18 60  08/02/17 61  06/15/17 82     Well nourished, well developed female in no acute distress. Constitutional:  oriented to person, place, and time. No distress.  Head: Normocephalic and atraumatic.  Eyes:  no discharge. No scleral icterus.  Neck: Normal range of motion. Neck supple.  Pulmonary/Chest: No audible wheezing, no distress, appears comfortable Musculoskeletal: Normal range of motion.  no  tenderness or deformity.  Neurological:   Coordination normal. Full exam not performed Skin:  No rash Psychiatric:  normal mood and affect. behavior is normal. Thought content normal.    ASSESSMENT & PLAN:    No diagnosis found.   COVID-19 Education: The signs and symptoms of COVID-19 were discussed with the patient and how to seek care for testing (follow up with PCP or arrange E-visit).  The importance of social distancing was discussed today.  Patient Risk:   After full review of this patients clinical status, I feel that they are at least moderate risk at this time.  Time:   Today, I have spent 25 minutes with the patient with telehealth technology discussing the cardiac and medical problems/diagnoses detailed above   10 min spent reviewing the chart prior to patient visit today   Medication Adjustments/Labs and Tests Ordered: Current medicines are reviewed at length with the patient today.  Concerns regarding medicines are outlined above.   Tests Ordered: No tests ordered   Medication Changes: No changes made   Disposition: Follow-up in 6 months   Signed, Ida Rogue, MD  02/11/2019 9:24 AM    [place] Office [address] [place] [address], [place],  [address]

## 2019-02-12 ENCOUNTER — Telehealth: Payer: Medicare Other | Admitting: Cardiovascular Disease

## 2019-02-12 ENCOUNTER — Other Ambulatory Visit: Payer: Self-pay

## 2019-04-28 NOTE — Progress Notes (Signed)
Cardiology Office Note  Date:  05/01/2019   ID:  Carrie Kane, DOB [birth year], MRN [id number]  PCP:  Cletis Athens, MD   Chief Complaint  Patient presents with  . Other    Patient c/o Dizziness. She thinks this is coming from a Medication she is taking. meds reviewed verbally with patient.     HPI:  [name] is a pleasant 49 component-year-old  with past medical history of Diabetes, insulin-dependent nonsmoker Hypertension Worsening chest pain consistent with unstable angina Cardiac catheterization showing severe left main disease, three-vessel disease Coronary artery bypass grafting x3  05/09/2017 (left internal mammary artery to left anterior descending artery, saphenous vein graft to diagonal, saphenous vein graft to the obtuse marginal). Who presents for follow-up of her coronary disease, s/p CABG  Reports that she feels okay Having some dizziness when she is standing Thinks it is 1 of her medications, the metoprolol Reports that she is tracking her blood pressure at home and typically runs 938 systolic  Blood pressure markedly elevated on today's visit 182 systolic on arrival Still running 993 systolic on my recheck Reports she is anxious from arriving in the clinic  Active, goes to the gym Denies any symptoms of shortness of breath or chest pain on exertion  Previous lab work Records have been requested from Dr. Rebecka Apley Old lab work as below HBA1C 8.4 Total chol 155 LDL 81 in 04/2017  EKG personally reviewed by myself on todays visit Shows normal sinus rhythm rate 59 bpm right bundle branch block left anterior fascicular block  Other past medical history reviewed  cardiac catheterization  LM lesion, 80 %stenosed.  Ost Cx to Prox Cx lesion, 95 %stenosed.  Mid Cx lesion, 70 %stenosed.  Mid LAD lesion, 30 %stenosed.  Prox LAD lesion, 80 %stenosed.  There is mild left ventricular systolic dysfunction.  LV end diastolic pressure is moderately  elevated.  The left ventricular ejection fraction is 45-50% by visual estimate.   1. Severe heavily calcified calcified left main stenosis extending into the ostium of the left circumflex with significant proximal LAD stenosis. Diffuse diabetic branch disease. 2. Mildly reduced LV systolic function with an EF of 45-50% with moderate mid to distal anterior and apical hypokinesis. 3. Severely elevated systemic hypertension with moderately elevated left ventricular end-diastolic pressure.  Echocardiogram done through primary care showing ejection fraction 45%, anteroseptal wall dyskinesia normal left atrial size, calcified mitral valve, diastolic dysfunction   PMH:   has a past medical history of CAD (coronary artery disease), Diastolic dysfunction, Hyperlipidemia, Hypertensive heart disease, and Insulin dependent diabetes mellitus ([place]).  PSH:    Past Surgical History:  Procedure Laterality Date  . ABDOMINAL HYSTERECTOMY    . CORONARY ARTERY BYPASS GRAFT N/A 05/09/2017   Procedure: CORONARY ARTERY BYPASS GRAFTING (CABG) x 3 using left internal mammary artery and right greater saphenous vein harvested endoscopically;  Surgeon: Ivin Poot, MD;  Location: [facility];  Service: Open Heart Surgery;  Laterality: N/A;  . INTRAOPERATIVE TRANSESOPHAGEAL ECHOCARDIOGRAM N/A 05/09/2017   Procedure: INTRAOPERATIVE TRANSESOPHAGEAL ECHOCARDIOGRAM;  Surgeon: Ivin Poot, MD;  Location: [facility];  Service: Open Heart Surgery;  Laterality: N/A;  . LEFT HEART CATH AND CORONARY ANGIOGRAPHY N/A 05/02/2017   Procedure: Left Heart Cath and Coronary Angiography;  Surgeon: Wellington Hampshire, MD;  Location: [facility];  Service: Cardiovascular;  Laterality: N/A;    Current Outpatient Medications  Medication Sig Dispense Refill  . alum & mag hydroxide-simeth (MAALOX/MYLANTA) 200-200-20 MG/5ML suspension Take 15 mLs  by mouth every 6 (six) hours as needed for indigestion or heartburn.    Marland Kitchen aspirin EC 81 MG  tablet Take 1 tablet (81 mg total) by mouth daily. 90 tablet 3  . atorvastatin (LIPITOR) 40 MG tablet Take 1 tablet (40 mg total) by mouth daily at 6 PM. 90 tablet 1  . benazepril-hydrochlorthiazide (LOTENSIN HCT) 20-12.5 MG per tablet Take 1 tablet by mouth 2 (two) times daily.     . Chlorpheniramine Maleate (ALLERGY PO) Take 1 tablet by mouth daily as needed (allergies).    . cholecalciferol (VITAMIN D) 1000 units tablet Take 2,000 Units by mouth daily at 12 noon.     . cloNIDine (CATAPRES) 0.2 MG tablet Take 1 tablet (0.2 mg total) by mouth 2 (two) times daily. 90 tablet 1  . clopidogrel (PLAVIX) 75 MG tablet TAKE 1 TABLET DAILY 90 tablet 4  . ferrous sulfate 325 (65 FE) MG EC tablet Take 325 mg by mouth 2 (two) times a day.    . insulin glargine (LANTUS) 100 UNIT/ML injection Inject 45 Units into the skin at bedtime.    . insulin lispro (HUMALOG) 100 UNIT/ML injection Inject 12-14 Units into the skin 2 (two) times daily. 12 units with lunch and 14 units with dinner    . insulin lispro protamine-lispro (HUMALOG 75/25 MIX) (75-25) 100 UNIT/ML SUSP injection Inject 19-38 Units into the skin daily with breakfast.    . levothyroxine (SYNTHROID, LEVOTHROID) 100 MCG tablet Take 100 mcg by mouth daily before breakfast.    . metoprolol tartrate (LOPRESSOR) 25 MG tablet Take 1 tablet (25 mg total) by mouth 2 (two) times daily. 180 tablet 3  . Multiple Vitamin (MULTIVITAMIN) capsule Take 1 capsule by mouth daily at 12 noon.     . nitroGLYCERIN (NITROSTAT) 0.4 MG SL tablet Place 1 tablet (0.4 mg total) under the tongue every 5 (five) minutes as needed for chest pain. 30 tablet 12  . Polyethyl Glycol-Propyl Glycol (SYSTANE OP) Apply 1 drop to eye as needed (dry eyes).     No current facility-administered medications for this visit.      Allergies:   Flexeril [cyclobenzaprine]   Social History:  The patient  reports that she has never smoked. She has never used smokeless tobacco. She reports that she  does not drink alcohol or use drugs.   Family History:   family history includes Breast cancer in her maternal aunt.    Review of Systems: Review of Systems  Constitutional: Negative.   HENT: Negative.   Respiratory: Negative.   Cardiovascular: Negative.   Gastrointestinal: Negative.   Musculoskeletal: Negative.   Neurological: Positive for dizziness.  Psychiatric/Behavioral: Negative.   All other systems reviewed and are negative.   PHYSICAL EXAM: VS:  BP (!) 184/86 (BP Location: Left Arm, Patient Position: Sitting, Cuff Size: Normal)   Pulse (!) 59   Ht 5' 8.5" (1.74 m)   Wt 178 lb 12 oz (81.1 kg)   BMI 26.78 kg/m  , BMI Body mass index is 26.78 kg/m. Constitutional:  oriented to person, place, and time. No distress.  HENT:  Head: Normocephalic and atraumatic.  Eyes:  no discharge. No scleral icterus.  Neck: Normal range of motion. Neck supple. No JVD present.  Cardiovascular: Normal rate, regular rhythm, normal heart sounds and intact distal pulses. Exam reveals no gallop and no friction rub. No edema No murmur heard. Pulmonary/Chest: Effort normal and breath sounds normal. No stridor. No respiratory distress.  no wheezes.  no rales.  no tenderness.  Abdominal: Soft.  no distension.  no tenderness.  Musculoskeletal: Normal range of motion.  no  tenderness or deformity.  Neurological:  normal muscle tone. Coordination normal. No atrophy Skin: Skin is warm and dry. No rash noted. not diaphoretic.  Psychiatric:  normal mood and affect. behavior is normal. Thought content normal.    Recent Labs: No results found for requested labs within last 8760 hours.    Lipid Panel Lab Results  Component Value Date   CHOL 155 05/03/2017   HDL 55 05/03/2017   LDLCALC 81 05/03/2017   TRIG 93 05/03/2017      Wt Readings from Last 3 Encounters:  05/01/19 178 lb 12 oz (81.1 kg)  02/08/18 178 lb 8 oz (81 kg)  08/11/17 180 lb 4.8 oz (81.8 kg)       ASSESSMENT AND  PLAN:  CAD, s/p CABG, She is on aspirin and Plavix given left main disease, severe three-vessel disease  Diabetic We have requested lab work from primary care She denies anginal symptoms  Bruit  Ultrasound 05/05/2017 less than 39% bilateral disease Medical management  Dizziness Blood pressure markedly elevated but she reports is well controlled at home At her request we will wean down her off the metoprolol which she feels is causing her dizziness She will closely monitor blood pressure and call us in the next week with blood pressure numbers and how she feels  Diabetes mellitus type 2, insulin dependent ([place]) She is working on her exercise, lifestyle modification Weight stable  Hyperlipidemia On Lipitor 40 mg daily  Lab requested from primary care, we can add Zetia if LDL not less than 70  PAD Lower extremity arterial Doppler showing moderate reduction arterial flow at rest on the right, noncompressible vessels on the left No claudication symptoms    Total encounter time more than 25 minutes  Greater than 50% was spent in counseling and coordination of care with the patient   Disposition:   F/U  12 months   No orders of the defined types were placed in this encounter.    Signed, Esmond Plants, M.D., Ph.D. 05/01/2019  [place], [place]

## 2019-05-01 ENCOUNTER — Other Ambulatory Visit: Payer: Self-pay

## 2019-05-01 ENCOUNTER — Ambulatory Visit (INDEPENDENT_AMBULATORY_CARE_PROVIDER_SITE_OTHER): Payer: Medicare Other | Admitting: Cardiovascular Disease

## 2019-05-01 ENCOUNTER — Encounter: Payer: Self-pay | Admitting: Cardiovascular Disease

## 2019-05-01 VITALS — BP 174/76 | HR 59 | Ht 68.5 in | Wt 178.8 lb

## 2019-05-01 DIAGNOSIS — E119 Type 2 diabetes mellitus without complications: Secondary | ICD-10-CM | POA: Diagnosis not present

## 2019-05-01 DIAGNOSIS — I25118 Atherosclerotic heart disease of native coronary artery with other forms of angina pectoris: Secondary | ICD-10-CM | POA: Diagnosis not present

## 2019-05-01 DIAGNOSIS — E785 Hyperlipidemia, unspecified: Secondary | ICD-10-CM

## 2019-05-01 DIAGNOSIS — I739 Peripheral vascular disease, unspecified: Secondary | ICD-10-CM | POA: Diagnosis not present

## 2019-05-01 DIAGNOSIS — I10 Essential (primary) hypertension: Secondary | ICD-10-CM | POA: Diagnosis not present

## 2019-05-01 DIAGNOSIS — Z794 Long term (current) use of insulin: Secondary | ICD-10-CM

## 2019-05-01 NOTE — Patient Instructions (Addendum)
Labs from Dr. Lavera Guise, old CBC and lipids   Medication Instructions:  Do a trial hold of the metoprolol See if you feel better, less dizzy  Please monitor BP  If you need a refill on your cardiac medications before your next appointment, please call your pharmacy.    Lab work: No new labs needed   If you have labs (blood work) drawn today and your tests are completely normal, you will receive your results only by: Marland Kitchen MyChart Message (if you have MyChart) OR . A paper copy in the mail If you have any lab test that is abnormal or we need to change your treatment, we will call you to review the results.   Testing/Procedures: No new testing needed   Follow-Up: At [facility], you and your health needs are our priority.  As part of our continuing mission to provide you with exceptional heart care, we have created designated Provider Care Teams.  These Care Teams include your primary Cardiologist (physician) and Advanced Practice Providers (APPs -  Physician Assistants and Nurse Practitioners) who all work together to provide you with the care you need, when you need it.  . You will need a follow up appointment in 6 months .   Please call our office 2 months in advance to schedule this appointment.    . Providers on your designated Care Team:   . Murray Hodgkins, NP . Christell Faith, PA-C . Marrianne Mood, PA-C  Any Other Special Instructions Will Be Listed Below (If Applicable).  For educational health videos Log in to : [email or web address] Or : [email or web address], password : [email or web address]

## 2019-09-03 IMAGING — CR DG SACRUM/COCCYX 2+V
1 series · 4 of 4 positions shown · non-contrast
Comparison: None.

CLINICAL DATA: Fall.  Tailbone injury

EXAM:
SACRUM AND COCCYX - 2+ VIEW

[Series 1: dg sacrum/coccyx · 0.14mm/px · 4 of 4 slices shown]
[im 1/4]
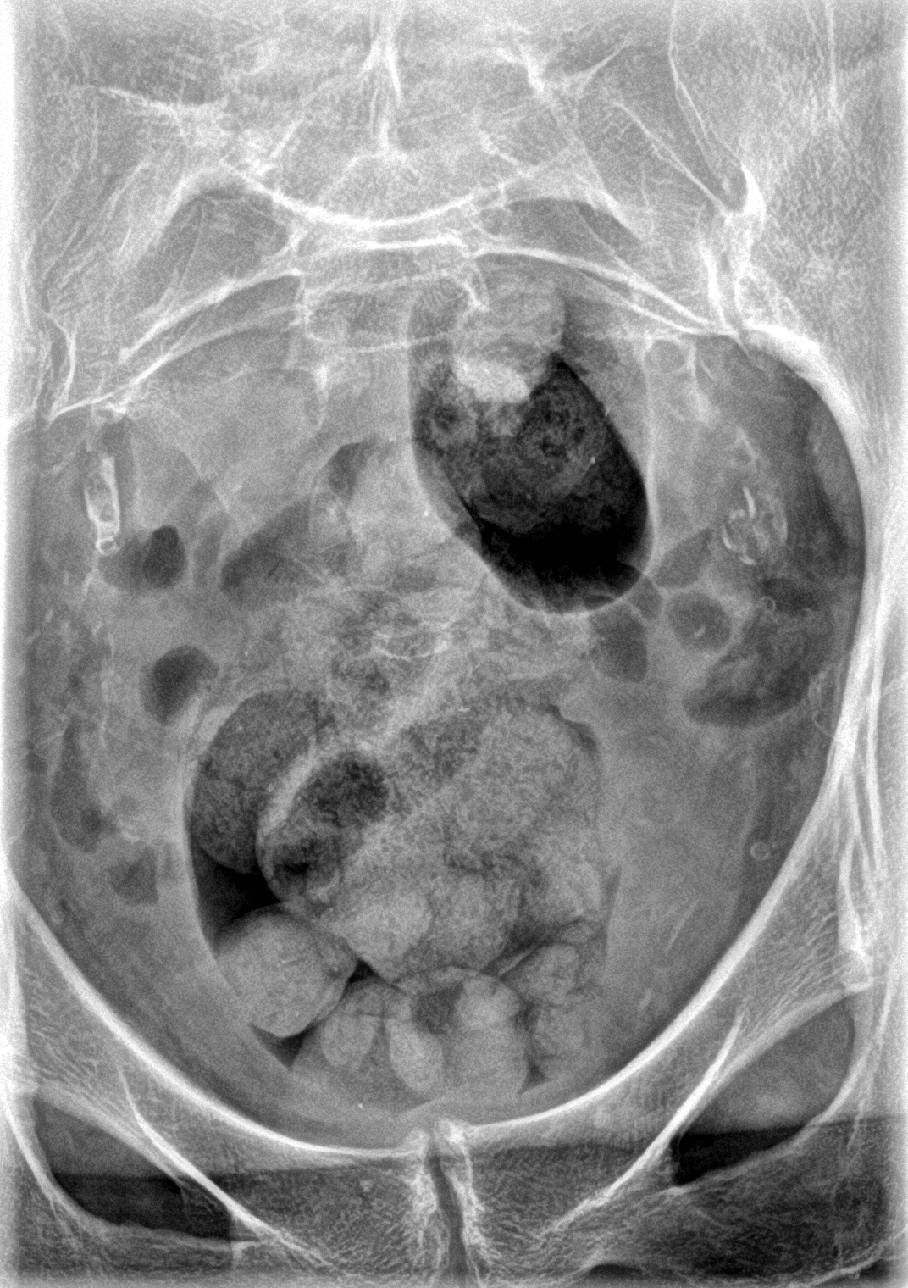
[im 2/4]
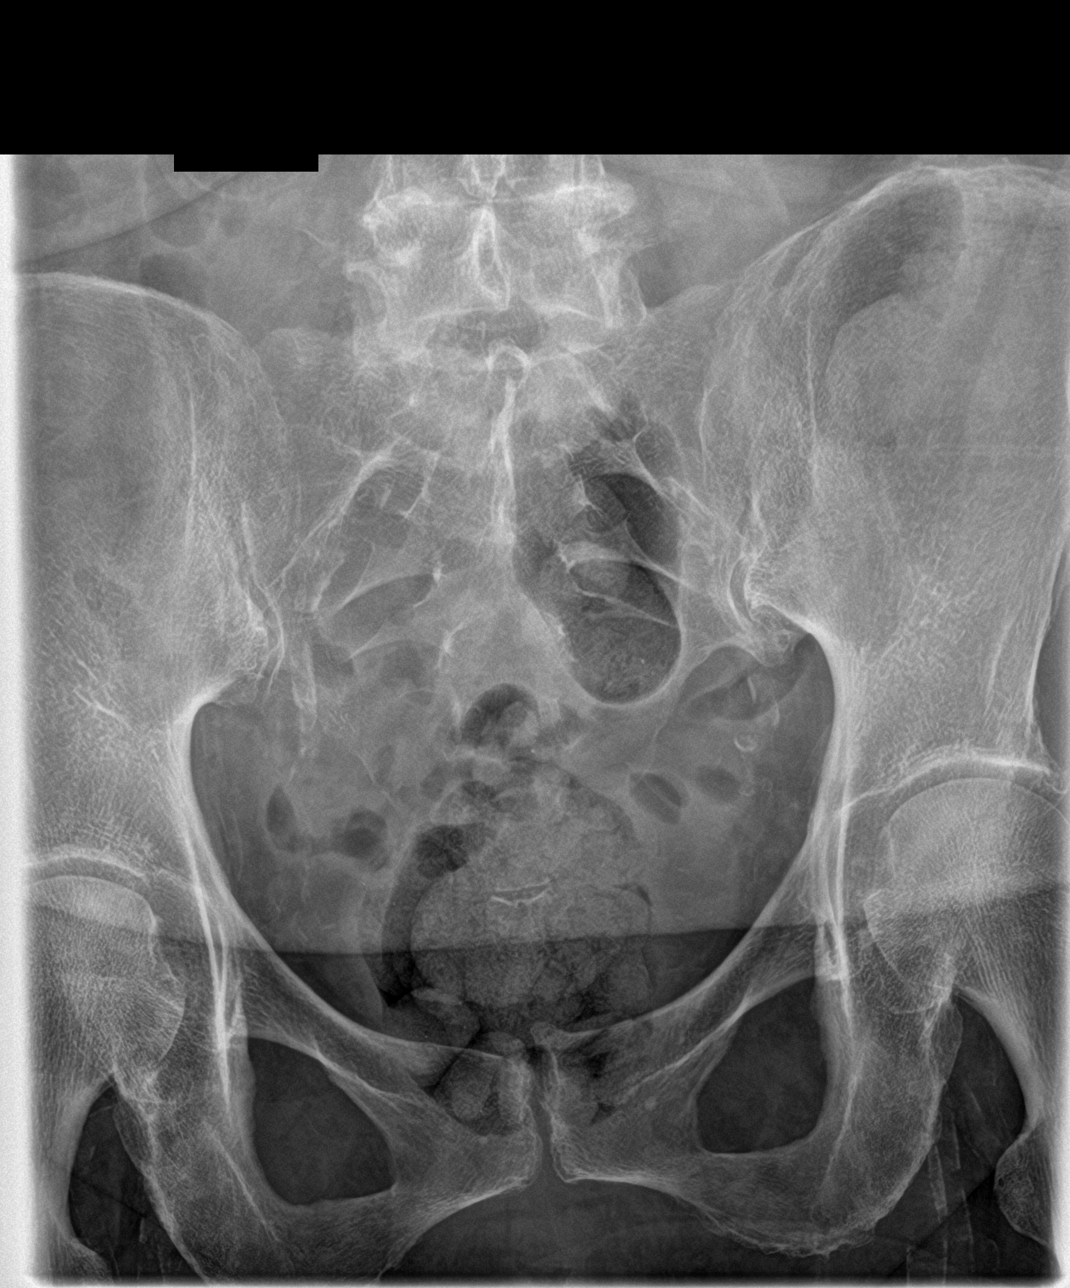
[im 3/4]
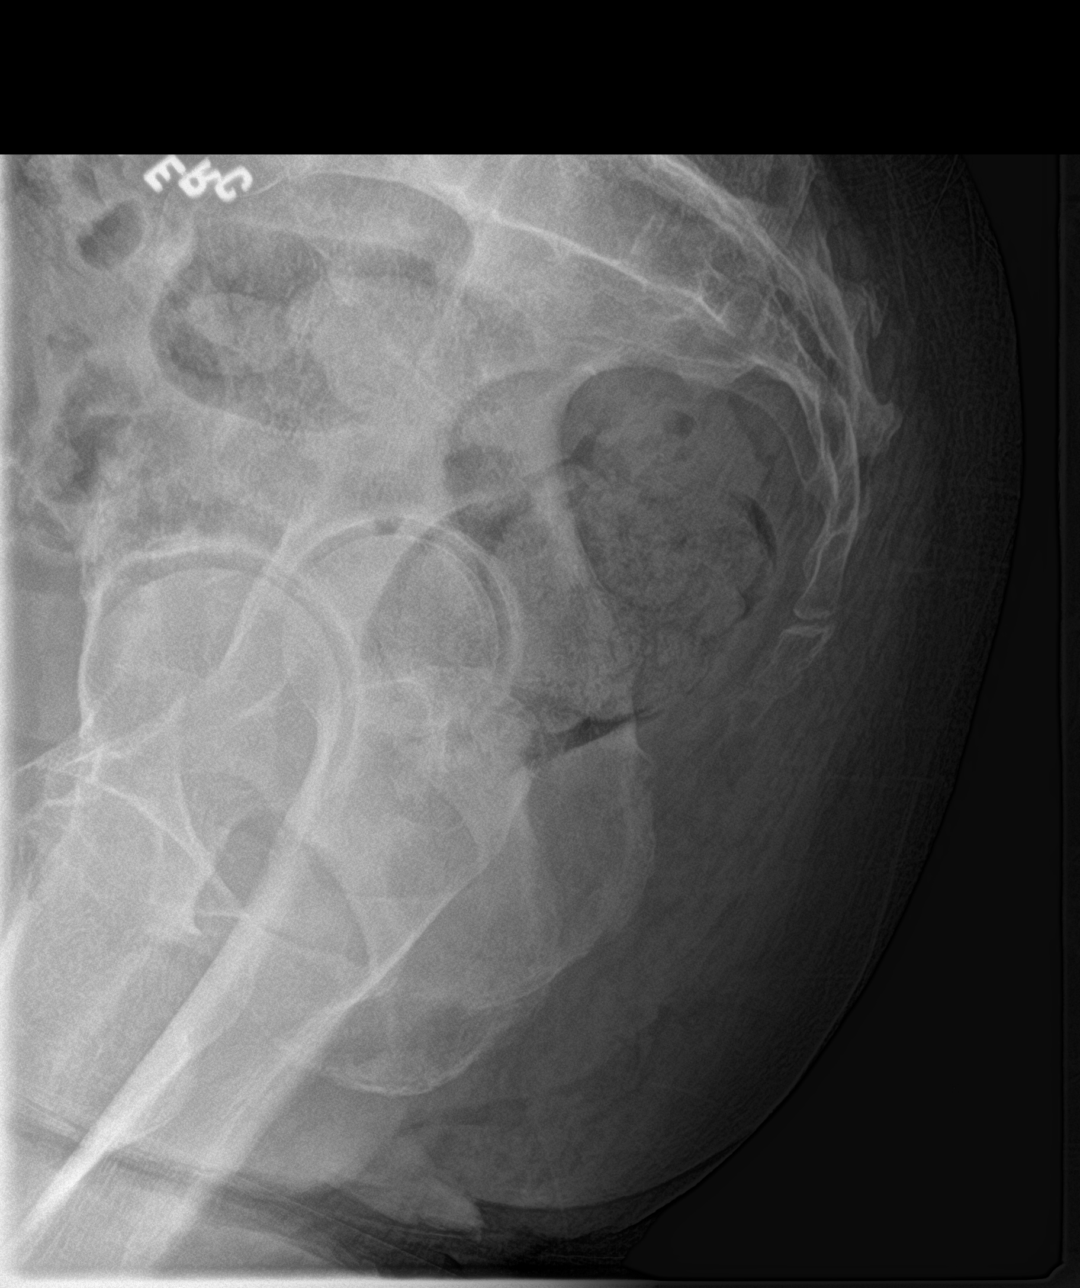
[im 4/4]
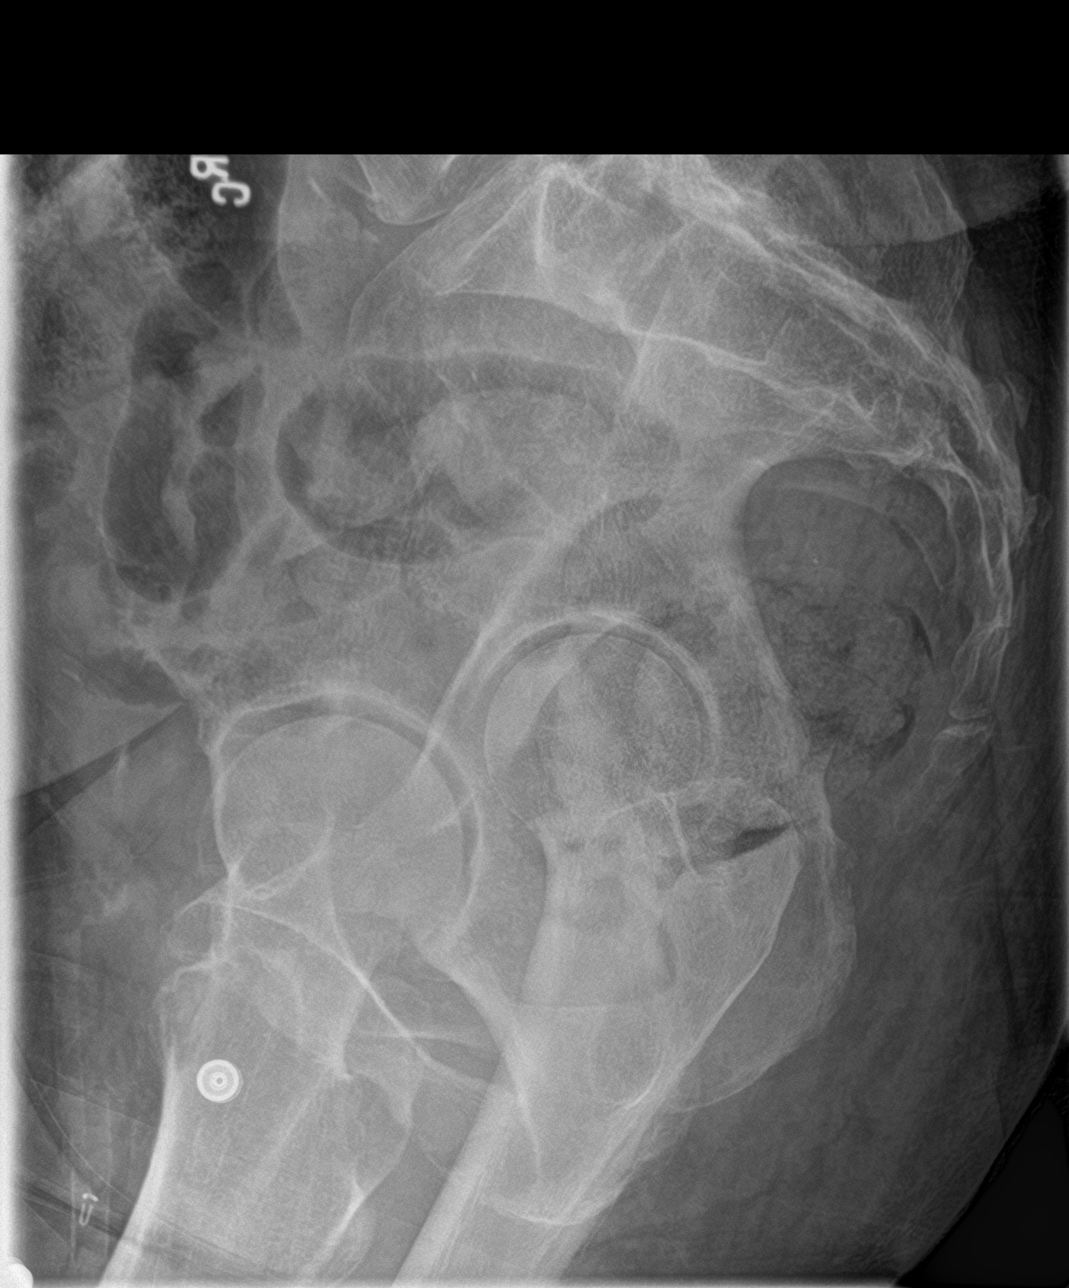

[4 of 4 positions shown; findings below may reference images not displayed]

FINDINGS: No displaced fracture of the sacrum or coccyx identified
IMPRESSION: No coccygeal fracture identified

## 2019-09-20 ENCOUNTER — Other Ambulatory Visit: Payer: Self-pay | Admitting: Cardiovascular Disease

## 2019-09-24 ENCOUNTER — Other Ambulatory Visit: Payer: Self-pay | Admitting: Cardiovascular Disease

## 2019-12-12 ENCOUNTER — Ambulatory Visit: Payer: Medicare Other | Admitting: Cardiovascular Disease

## 2019-12-17 ENCOUNTER — Other Ambulatory Visit: Payer: Self-pay | Admitting: Cardiovascular Disease

## 2019-12-24 NOTE — Progress Notes (Signed)
Cardiology Office Note  Date:  12/25/2019   ID:  Carrie Kane, DOB [birth year], MRN [id number]  PCP:  Cletis Athens, MD   Chief Complaint  Patient presents with  . other    6 month follow up. Meds reviewed by the pt. verbally. "doing well." Pt. c/o fluttering in chest at times.     HPI:  Carrie Kane is a pleasant 79 year-old  with PMH of Diabetes, insulin-dependent nonsmoker Hypertension Worsening chest pain consistent with unstable angina Cardiac catheterization showing severe left main disease, three-vessel disease Coronary artery bypass grafting x3  05/09/2017 (left internal mammary artery to left anterior descending artery, saphenous vein graft to diagonal, saphenous vein graft to the obtuse marginal). Who presents for follow-up of her coronary disease, s/p CABG  No recent labs: Follows [facility], labs scheduled, has not had them done yet She does everything around the house Husband does not help her very much  Reports her blood pressure has been well controlled  No dizziness if stays hydrated Drinks a lot of water ocassional palpitations, 3-4 days in a row Taking metoprolol only as needed not on a regular basis  On prior clinic visit was active, going to the gym No chest pain or shortness of breath on exertion  Notes were requested reviewed from primary care  Previous lab work HBA1C 8.4 Total chol 155 LDL 81 in 04/2017 No new labs available but have been requested from primary care  EKG personally reviewed by myself on todays visit Shows normal sinus rhythm rate 59 bpm right bundle branch block left anterior fascicular block  Other past medical history reviewed  cardiac catheterization  LM lesion, 80 %stenosed.  Ost Cx to Prox Cx lesion, 95 %stenosed.  Mid Cx lesion, 70 %stenosed.  Mid LAD lesion, 30 %stenosed.  Prox LAD lesion, 80 %stenosed.  There is mild left ventricular systolic dysfunction.  LV end diastolic pressure is moderately elevated.  The  left ventricular ejection fraction is 45-50% by visual estimate.   1. Severe heavily calcified calcified left main stenosis extending into the ostium of the left circumflex with significant proximal LAD stenosis. Diffuse diabetic branch disease. 2. Mildly reduced LV systolic function with an EF of 45-50% with moderate mid to distal anterior and apical hypokinesis. 3. Severely elevated systemic hypertension with moderately elevated left ventricular end-diastolic pressure.  Echocardiogram done through primary care showing ejection fraction 45%, anteroseptal wall dyskinesia normal left atrial size, calcified mitral valve, diastolic dysfunction   PMH:   has a past medical history of CAD (coronary artery disease), Diastolic dysfunction, Hyperlipidemia, Hypertensive heart disease, and Insulin dependent diabetes mellitus.  PSH:    Past Surgical History:  Procedure Laterality Date  . ABDOMINAL HYSTERECTOMY    . CORONARY ARTERY BYPASS GRAFT N/A 05/09/2017   Procedure: CORONARY ARTERY BYPASS GRAFTING (CABG) x 3 using left internal mammary artery and right greater saphenous vein harvested endoscopically;  Surgeon: Ivin Poot, MD;  Location: [facility];  Service: Open Heart Surgery;  Laterality: N/A;  . INTRAOPERATIVE TRANSESOPHAGEAL ECHOCARDIOGRAM N/A 05/09/2017   Procedure: INTRAOPERATIVE TRANSESOPHAGEAL ECHOCARDIOGRAM;  Surgeon: Ivin Poot, MD;  Location: [facility];  Service: Open Heart Surgery;  Laterality: N/A;  . LEFT HEART CATH AND CORONARY ANGIOGRAPHY N/A 05/02/2017   Procedure: Left Heart Cath and Coronary Angiography;  Surgeon: Wellington Hampshire, MD;  Location: [facility];  Service: Cardiovascular;  Laterality: N/A;    Current Outpatient Medications  Medication Sig Dispense Refill  . alum & mag hydroxide-simeth (MAALOX/MYLANTA) 200-200-20  MG/5ML suspension Take 15 mLs by mouth every 6 (six) hours as needed for indigestion or heartburn.    Marland Kitchen aspirin EC 81 MG tablet Take 1 tablet (81  mg total) by mouth daily. 90 tablet 3  . atorvastatin (LIPITOR) 40 MG tablet Take 1 tablet (40 mg total) by mouth daily at 6 PM. 90 tablet 1  . benazepril-hydrochlorthiazide (LOTENSIN HCT) 20-12.5 MG per tablet Take 1 tablet by mouth 2 (two) times daily.     . Chlorpheniramine Maleate (ALLERGY PO) Take 1 tablet by mouth daily as needed (allergies).    . cholecalciferol (VITAMIN D) 1000 units tablet Take 2,000 Units by mouth daily at 12 noon.     . cloNIDine (CATAPRES) 0.2 MG tablet Take 1 tablet (0.2 mg total) by mouth 2 (two) times daily. 90 tablet 1  . clopidogrel (PLAVIX) 75 MG tablet TAKE 1 TABLET DAILY 90 tablet 0  . ferrous sulfate 325 (65 FE) MG EC tablet Take 325 mg by mouth 2 (two) times a day.    . insulin glargine (LANTUS) 100 UNIT/ML injection Inject 45 Units into the skin at bedtime.    . insulin lispro (HUMALOG) 100 UNIT/ML injection Inject 12-14 Units into the skin 2 (two) times daily. 12 units with lunch and 14 units with dinner    . insulin lispro protamine-lispro (HUMALOG 75/25 MIX) (75-25) 100 UNIT/ML SUSP injection Inject 19-38 Units into the skin daily with breakfast.    . levothyroxine (SYNTHROID, LEVOTHROID) 100 MCG tablet Take 100 mcg by mouth daily before breakfast.    . metoprolol tartrate (LOPRESSOR) 25 MG tablet TAKE 1 TABLET TWICE A DAY 180 tablet 0  . Multiple Vitamin (MULTIVITAMIN) capsule Take 1 capsule by mouth daily at 12 noon.     . nitroGLYCERIN (NITROSTAT) 0.4 MG SL tablet Place 1 tablet (0.4 mg total) under the tongue every 5 (five) minutes as needed for chest pain. 30 tablet 12  . Polyethyl Glycol-Propyl Glycol (SYSTANE OP) Apply 1 drop to eye as needed (dry eyes).     No current facility-administered medications for this visit.     Allergies:   Flexeril [cyclobenzaprine]   Social History:  The patient  reports that she has never smoked. She has never used smokeless tobacco. She reports that she does not drink alcohol or use drugs.   Family History:    family history includes Breast cancer in her maternal aunt.    Review of Systems: Review of Systems  Constitutional: Negative.   HENT: Negative.   Respiratory: Negative.   Cardiovascular: Negative.   Gastrointestinal: Negative.   Musculoskeletal: Negative.   Neurological: Negative.   Psychiatric/Behavioral: Negative.   All other systems reviewed and are negative.   PHYSICAL EXAM: VS:  BP 124/62 (BP Location: Left Arm, Patient Position: Sitting, Cuff Size: Normal) Comment: Checked 8 minutes after the first BP  Pulse (!) 58   Ht 5\' 8"  (1.727 m)   Wt 178 lb (80.7 kg)   SpO2 98%   BMI 27.06 kg/m  , BMI Body mass index is 27.06 kg/m. Constitutional:  oriented to person, place, and time. No distress.  HENT:  Head: Grossly normal Eyes:  no discharge. No scleral icterus.  Neck: No JVD, no carotid bruits  Cardiovascular: Regular rate and rhythm, no murmurs appreciated Pulmonary/Chest: Clear to auscultation bilaterally, no wheezes or rails Abdominal: Soft.  no distension.  no tenderness.  Musculoskeletal: Normal range of motion Neurological:  normal muscle tone. Coordination normal. No atrophy Skin: Skin warm and dry  Psychiatric: normal affect, pleasant  Recent Labs: No results found for requested labs within last 8760 hours.    Lipid Panel Lab Results  Component Value Date   CHOL 155 05/03/2017   HDL 55 05/03/2017   LDLCALC 81 05/03/2017   TRIG 93 05/03/2017      Wt Readings from Last 3 Encounters:  12/25/19 178 lb (80.7 kg)  05/01/19 178 lb 12 oz (81.1 kg)  02/08/18 178 lb 8 oz (81 kg)       ASSESSMENT AND PLAN:  CAD, s/p CABG,  on aspirin and Plavix given left main disease, severe three-vessel disease  Currently with no symptoms of angina. No further workup at this time. Continue current medication regimen. Carotid stenosis Ultrasound 05/05/2017 less than 39% bilateral disease Medical management  Dizziness Dizziness has resolved, reports that if she  stays hydrated she has no significant symptoms Also taking metoprolol as needed not on a regular basis  Palpitations Paroxysmal episodes, resolved with metoprolol Recommend she call us if symptoms get worse  Diabetes mellitus type 2, insulin dependent ([place]) Weight stable, lab work through primary care  Hyperlipidemia On statin, lipids ordered by primary care, will try to obtain these numbers when they are available Goal LDL less than 70  PAD Lower extremity arterial Doppler showing moderate reduction arterial flow at rest on the right, noncompressible vessels on the left She denies any claudication symptoms Goal LDL less than 70    Total encounter time more than 25 minutes  Greater than 50% was spent in counseling and coordination of care with the patient   Disposition:   F/U  12 months   No orders of the defined types were placed in this encounter.    Signed, Esmond Plants, M.D., Ph.D. 12/25/2019  [place], [place]

## 2019-12-25 ENCOUNTER — Encounter: Payer: Self-pay | Admitting: Cardiovascular Disease

## 2019-12-25 ENCOUNTER — Ambulatory Visit (INDEPENDENT_AMBULATORY_CARE_PROVIDER_SITE_OTHER): Payer: Medicare Other | Admitting: Cardiovascular Disease

## 2019-12-25 ENCOUNTER — Other Ambulatory Visit: Payer: Self-pay

## 2019-12-25 VITALS — BP 124/62 | HR 58 | Ht 68.0 in | Wt 178.0 lb

## 2019-12-25 DIAGNOSIS — I739 Peripheral vascular disease, unspecified: Secondary | ICD-10-CM

## 2019-12-25 DIAGNOSIS — E119 Type 2 diabetes mellitus without complications: Secondary | ICD-10-CM | POA: Diagnosis not present

## 2019-12-25 DIAGNOSIS — I10 Essential (primary) hypertension: Secondary | ICD-10-CM | POA: Diagnosis not present

## 2019-12-25 DIAGNOSIS — E785 Hyperlipidemia, unspecified: Secondary | ICD-10-CM

## 2019-12-25 DIAGNOSIS — I25118 Atherosclerotic heart disease of native coronary artery with other forms of angina pectoris: Secondary | ICD-10-CM

## 2019-12-25 DIAGNOSIS — Z794 Long term (current) use of insulin: Secondary | ICD-10-CM

## 2019-12-25 DIAGNOSIS — Z951 Presence of aortocoronary bypass graft: Secondary | ICD-10-CM

## 2019-12-25 MED ORDER — ROSUVASTATIN CALCIUM 5 MG PO TABS: 5.0000 mg | ORAL_TABLET | Freq: Every day | ORAL | 3 refills | Status: DC

## 2019-12-25 NOTE — Patient Instructions (Signed)

## 2019-12-28 ENCOUNTER — Ambulatory Visit: Payer: Medicare Other | Admitting: Cardiovascular Disease

## 2020-03-18 ENCOUNTER — Other Ambulatory Visit: Payer: Self-pay | Admitting: Cardiovascular Disease

## 2020-04-03 ENCOUNTER — Ambulatory Visit: Payer: Medicare Other | Admitting: Podiatry

## 2020-04-03 ENCOUNTER — Other Ambulatory Visit: Payer: Self-pay

## 2020-04-03 ENCOUNTER — Encounter: Payer: Self-pay | Admitting: Podiatry

## 2020-04-03 DIAGNOSIS — L84 Corns and callosities: Secondary | ICD-10-CM | POA: Insufficient documentation

## 2020-04-03 NOTE — Progress Notes (Signed)
This patient presents the office for continued evaluation and treatment of a callus on her left forefoot.  She states she has been seen by a podiatrist in [facility] for treatment of this callus left forefoot.  She says that the callus has been present for over a year and that she was last seen approximately 2 weeks ago for nail treatment and callus treatment.  At that time he recommended that she be seen by a podiatrist in [place] where she lives.  He has treated her with diabetic insoles with appropriate dispersion padding at the site of the's callus left forefoot.  She presents the office today for a diabetic foot exam and an evaluation of her left forefoot callus.   Vascular  Dorsalis pedis and posterior tibial pulses are not  palpable  B/L.  Capillary return  WNL.  Temperature gradient is  WNL.  Skin turgor  WNL  Sensorium  Senn Weinstein monofilament wire diminished.. Diminished  tactile sensation.  Nail Exam  Patient has normal nails with no evidence of bacterial or fungal infection.  Orthopedic  Exam  Muscle tone and muscle strength  WNL.  No limitations of motion feet  B/L.  No crepitus or joint effusion noted.  HAV  B/L.  Hammer toes 2-5  B/L.    Skin  No open lesions.  Normal skin texture and turgor.  Sub 2 callus left forefoot.  Closed lesion with no redness, swelling or infection.    Callus sub 2 left foot secondary to hammer toes 2,3  Left foot.  IE.  Discussed my findings of her feet with this patient.  My findings indicate she has disease of  her vessels in her feet bilaterally as well as diminished L OPS bilaterally.  Her callus sub 2  appears to be stable and no evidence of any skin breakdown is noted.  I told this patient to return to the office 9 weeks.  Patient would benefit from a vascular consult in the future.   Gardiner Barefoot DPM

## 2020-04-05 ENCOUNTER — Emergency Department: Payer: Medicare Other

## 2020-04-05 ENCOUNTER — Encounter: Payer: Self-pay | Admitting: *Deleted

## 2020-04-05 ENCOUNTER — Emergency Department
Admission: EM | Admit: 2020-04-05 | Discharge: 2020-04-05 | Disposition: A | Discharge status: Home / Self Care | Payer: Medicare Other | Attending: Emergency Medicine | Admitting: Emergency Medicine

## 2020-04-05 ENCOUNTER — Other Ambulatory Visit: Payer: Self-pay

## 2020-04-05 DIAGNOSIS — M7918 Myalgia, other site: Secondary | ICD-10-CM

## 2020-04-05 DIAGNOSIS — Z7982 Long term (current) use of aspirin: Secondary | ICD-10-CM | POA: Insufficient documentation

## 2020-04-05 DIAGNOSIS — Z794 Long term (current) use of insulin: Secondary | ICD-10-CM | POA: Insufficient documentation

## 2020-04-05 DIAGNOSIS — I110 Hypertensive heart disease with heart failure: Secondary | ICD-10-CM | POA: Insufficient documentation

## 2020-04-05 DIAGNOSIS — I5020 Unspecified systolic (congestive) heart failure: Secondary | ICD-10-CM | POA: Insufficient documentation

## 2020-04-05 DIAGNOSIS — I2510 Atherosclerotic heart disease of native coronary artery without angina pectoris: Secondary | ICD-10-CM | POA: Diagnosis not present

## 2020-04-05 DIAGNOSIS — R519 Headache, unspecified: Secondary | ICD-10-CM | POA: Diagnosis present

## 2020-04-05 DIAGNOSIS — M542 Cervicalgia: Secondary | ICD-10-CM | POA: Insufficient documentation

## 2020-04-05 DIAGNOSIS — E119 Type 2 diabetes mellitus without complications: Secondary | ICD-10-CM | POA: Diagnosis not present

## 2020-04-05 DIAGNOSIS — Z79899 Other long term (current) drug therapy: Secondary | ICD-10-CM | POA: Diagnosis not present

## 2020-04-05 DIAGNOSIS — Z951 Presence of aortocoronary bypass graft: Secondary | ICD-10-CM | POA: Diagnosis not present

## 2020-04-05 DIAGNOSIS — W19XXXA Unspecified fall, initial encounter: Secondary | ICD-10-CM

## 2020-04-05 IMAGING — CT CT HEAD W/O CM
3 series · 15 of 44 positions shown, 18 images · non-contrast
Comparison: [DATE]

CLINICAL DATA: Fall.  Headache.  Struck head.

EXAM:
CT HEAD WITHOUT CONTRAST
CT CERVICAL SPINE WITHOUT CONTRAST
TECHNIQUE: Multidetector CT imaging of the head and cervical spine was
performed following the standard protocol without intravenous
contrast. Multiplanar CT image reconstructions of the cervical spine
were also generated.

[Series 2: head wo · axial · 0.39mm/px · z∈[-66,+44]mm · 9 of 27 slices shown, 12 images]
[im 3/27  brain]
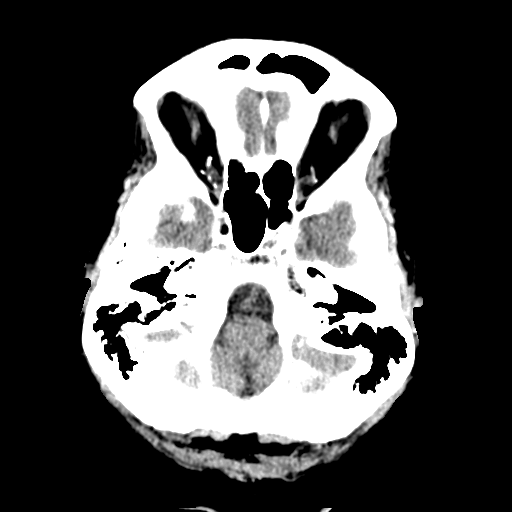
[im 3/27  bone]
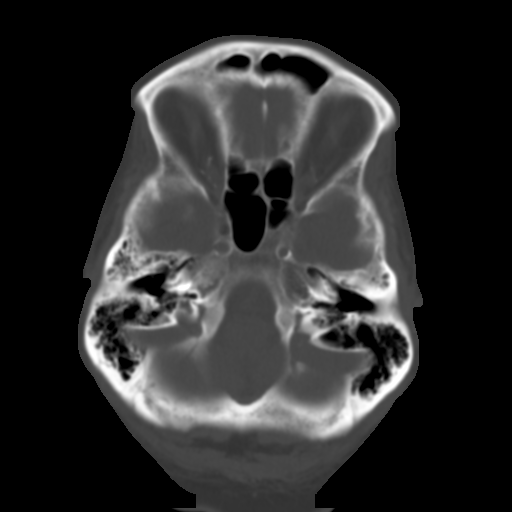
[im 6/27  brain]
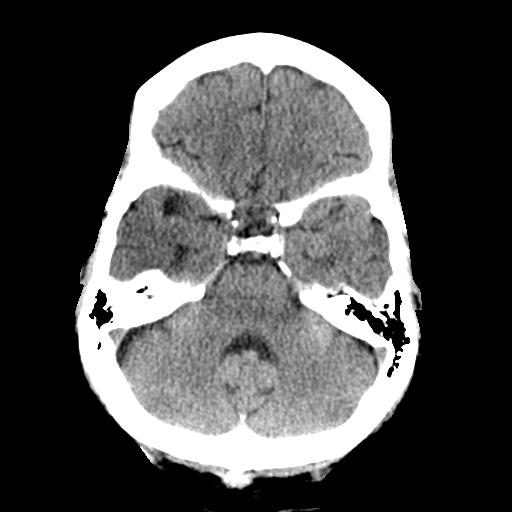
[im 8/27  brain]
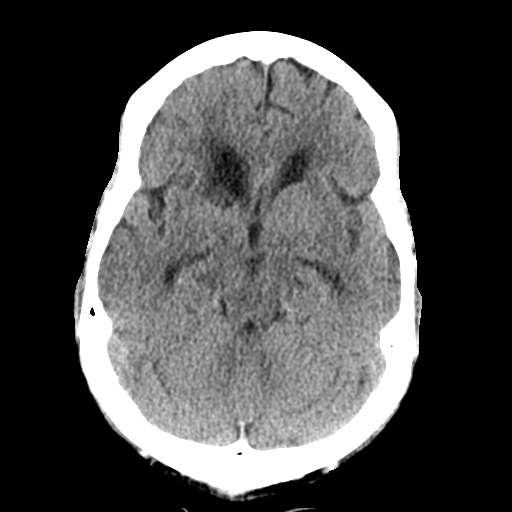
[im 11/27  brain]
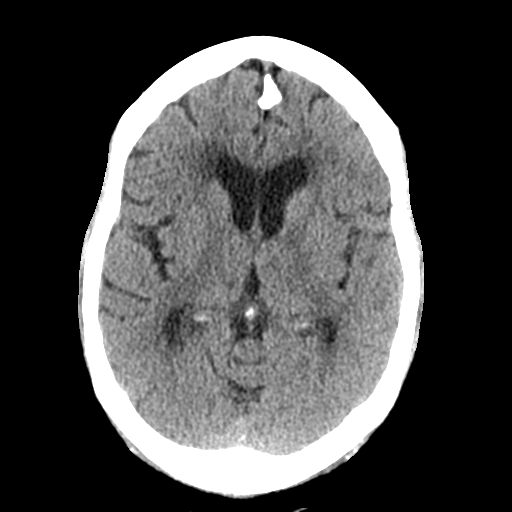
[im 14/27  brain]
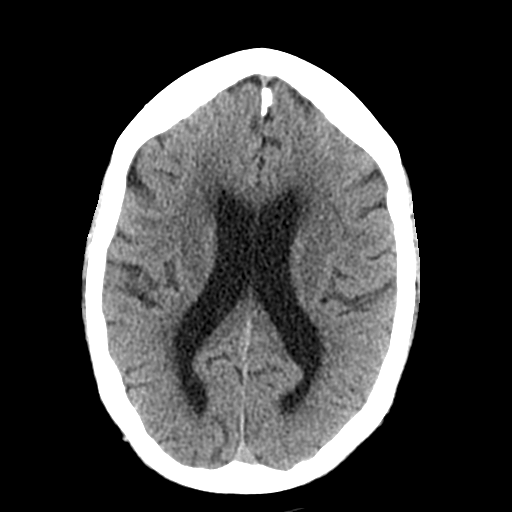
[im 14/27  bone]
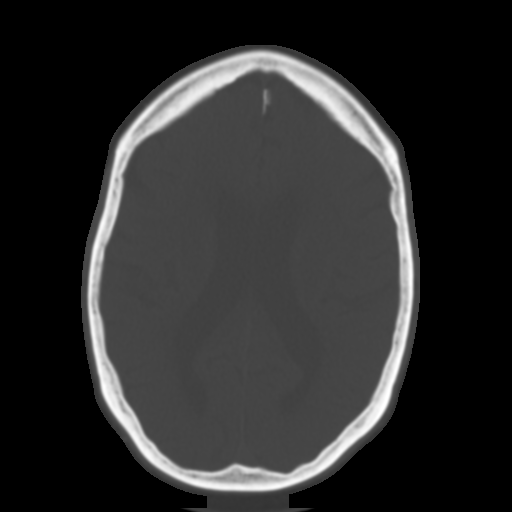
[im 17/27  brain]
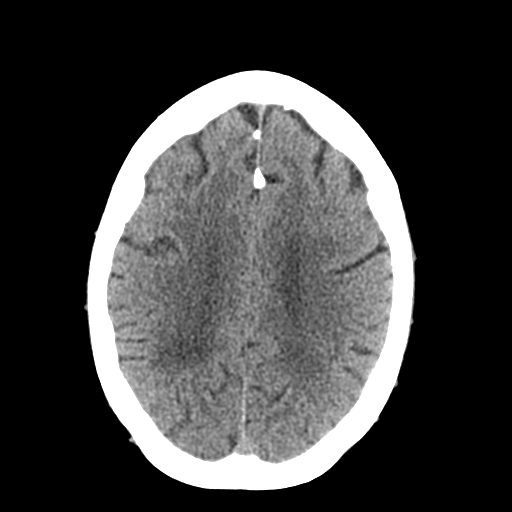
[im 20/27  brain]
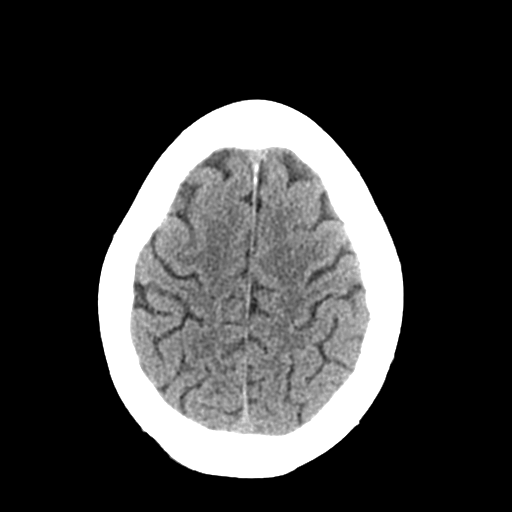
[im 22/27  brain]
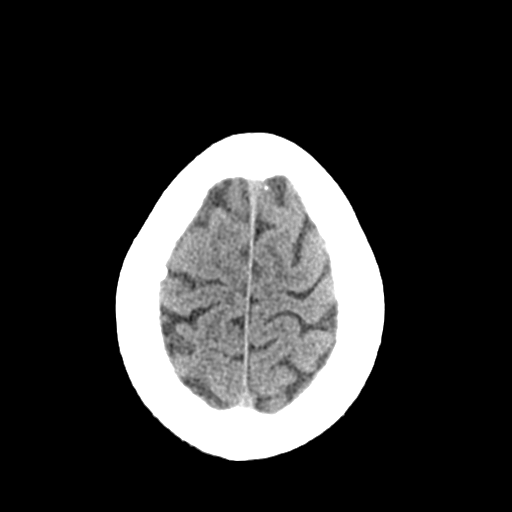
[im 25/27  brain]
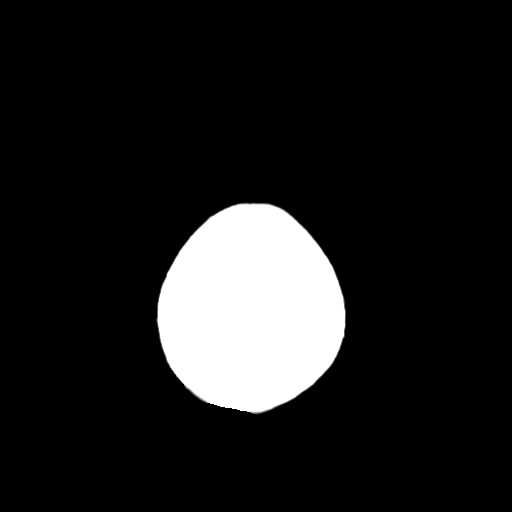
[im 25/27  bone]
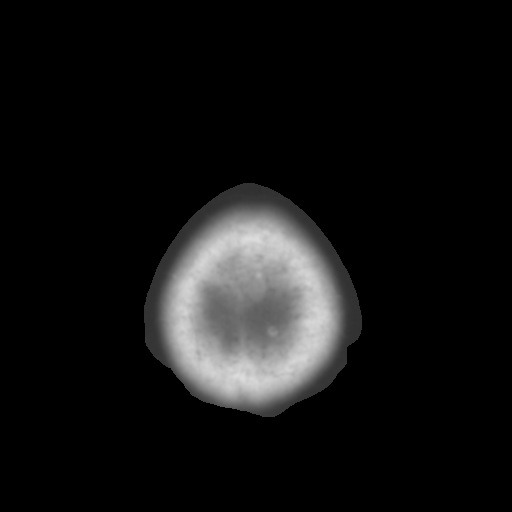

[Series 4: coronal soft tissue · coronal · 0.29mm/px · 3 of 61 slices shown]
[im 21/61  brain]
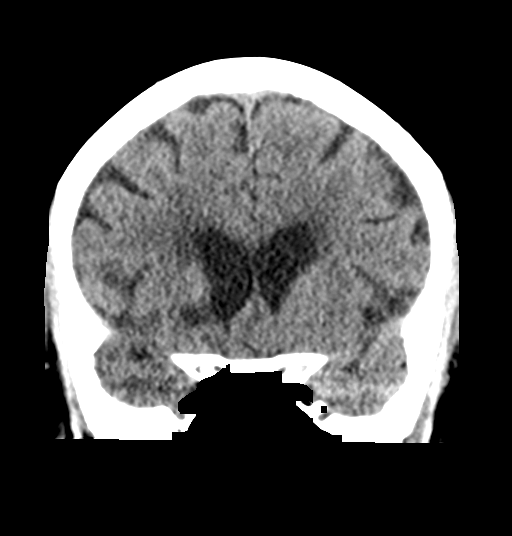
[im 27/61  brain]
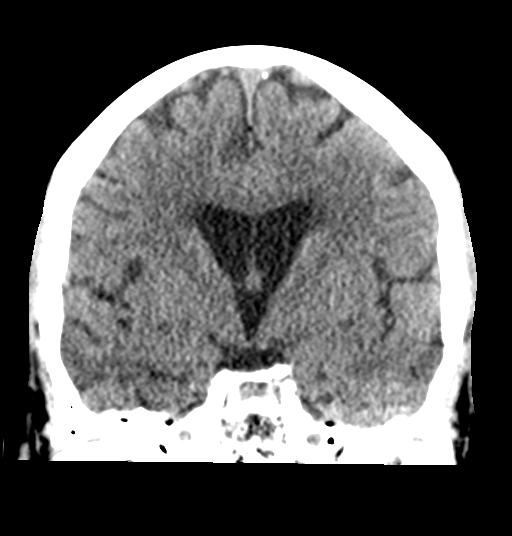
[im 34/61  brain]
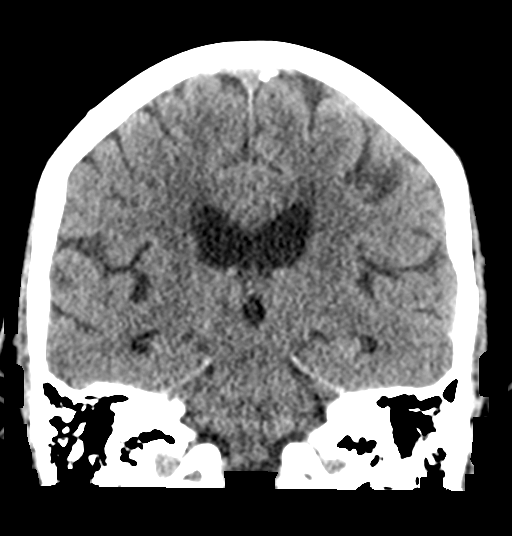

[Series 5: sagittal soft tissue · sagittal · 0.27mm/px · 3 of 50 slices shown]
[im 17/50  brain]
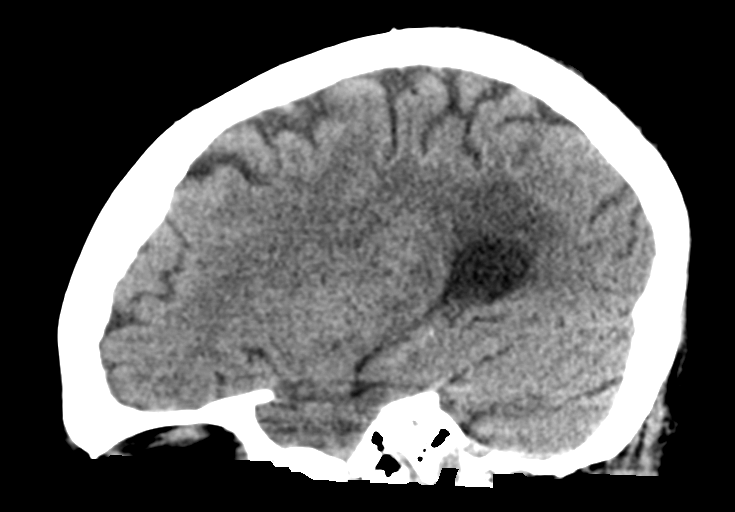
[im 25/50  brain]
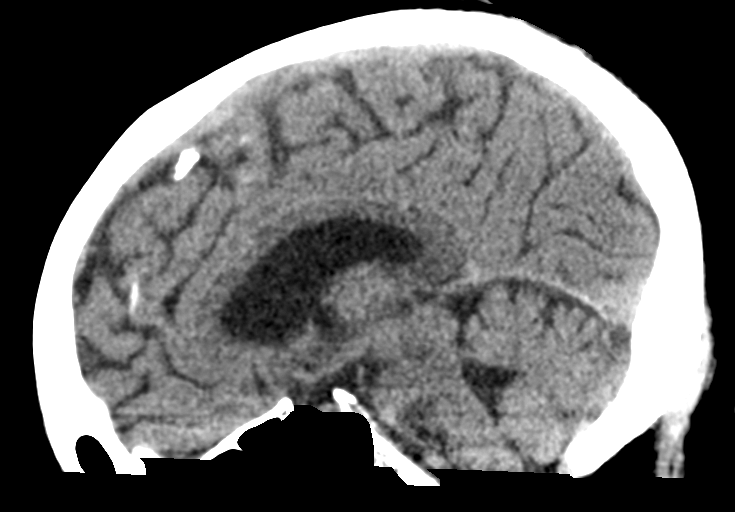
[im 33/50  brain]
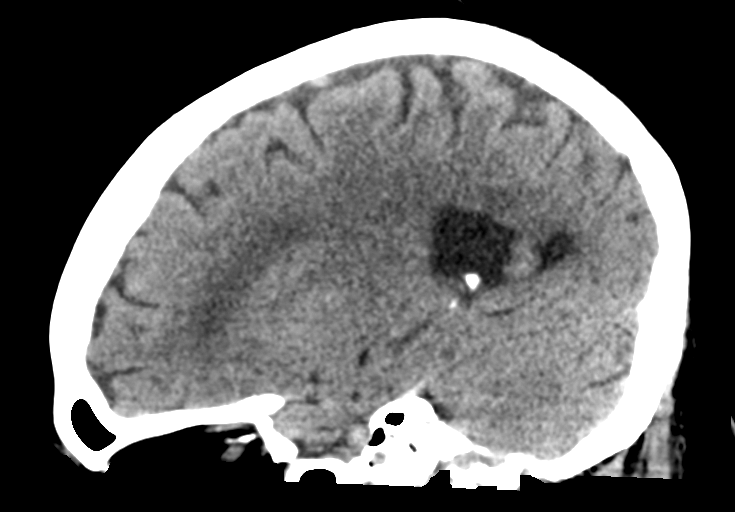

[15 of 44 positions shown; findings below may reference images not displayed]

FINDINGS: CT HEAD FINDINGS

Brain: No acute intracranial hemorrhage. No focal mass lesion. No CT
evidence of acute infarction. No midline shift or mass effect. No
hydrocephalus. Basilar cisterns are patent.

There are periventricular and subcortical white matter
hypodensities. Generalized cortical atrophy.

Vascular: No hyperdense vessel or unexpected calcification.

Skull: Normal. Negative for fracture or focal lesion.

Sinuses/Orbits: Paranasal sinuses and mastoid air cells are clear.
Orbits are clear.

Other: None.

CT CERVICAL SPINE FINDINGS

Alignment: Normal alignment of the cervical vertebral bodies.

Skull base and vertebrae: Normal craniocervical junction. No loss of
vertebral body height or disc height. Normal facet articulation. No
evidence of fracture.

Soft tissues and spinal canal: No prevertebral soft tissue swelling.
No perispinal or epidural hematoma.

Disc levels: Endplate spurring C4-T1. Mild loss disc space height
and endplate sclerosis. No acute findings.

Upper chest: Clear

Other: None
IMPRESSION: 1. No intracranial trauma.
2. Mild atrophy and white matter microvascular disease.
3. No cervical spine fracture.
4. Moderate disc osteophytic disease.

## 2020-04-05 IMAGING — CT CT CERVICAL SPINE W/O CM
3 of 4 series · 10 of 33 positions shown, 11 images · non-contrast
Comparison: [DATE]

CLINICAL DATA: Fall.  Headache.  Struck head.

EXAM:
CT HEAD WITHOUT CONTRAST
CT CERVICAL SPINE WITHOUT CONTRAST
TECHNIQUE: Multidetector CT imaging of the head and cervical spine was
performed following the standard protocol without intravenous
contrast. Multiplanar CT image reconstructions of the cervical spine
were also generated.

[Series 4: sagittal bone · sagittal · 0.28mm/px · 5 of 39 slices shown]
[im 13/39  bone]
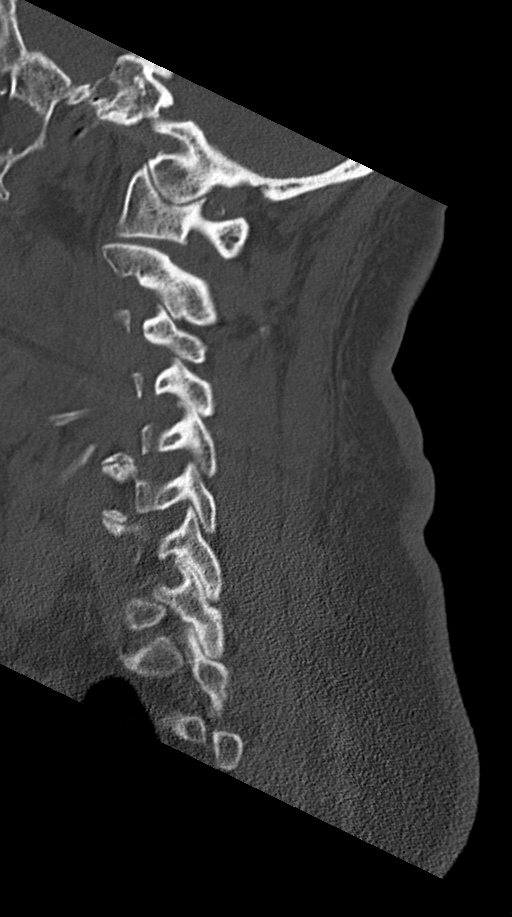
[im 16/39  bone]
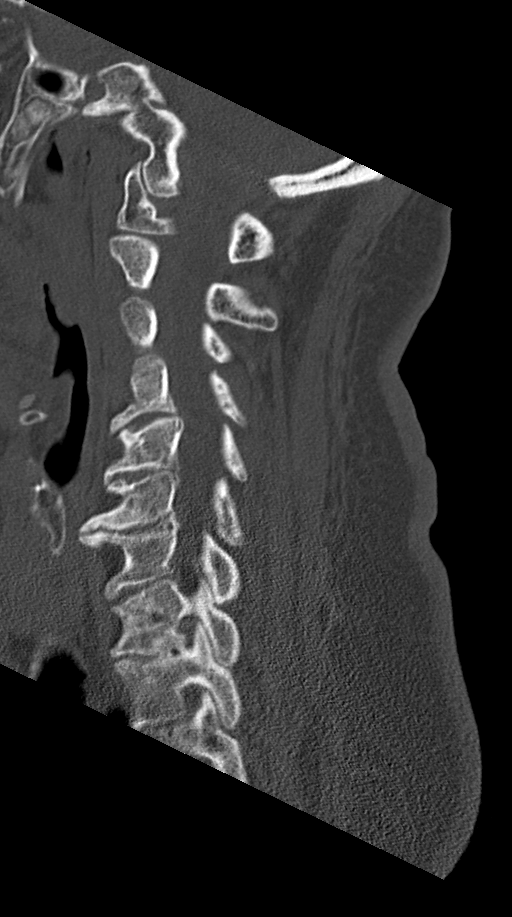
[im 20/39  bone]
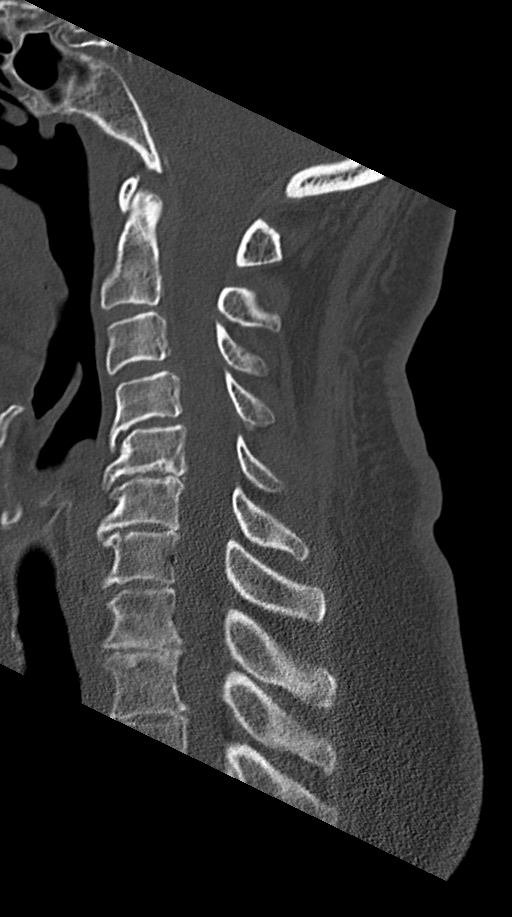
[im 23/39  bone]
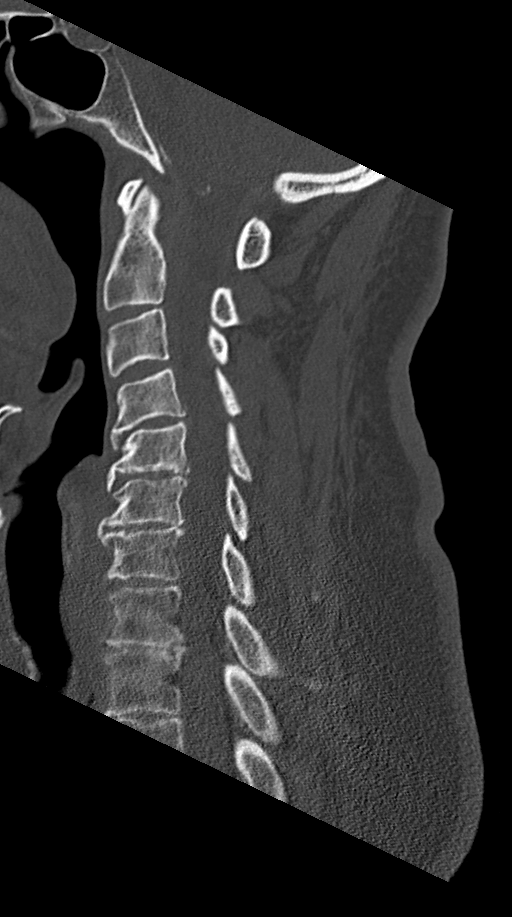
[im 26/39  bone]
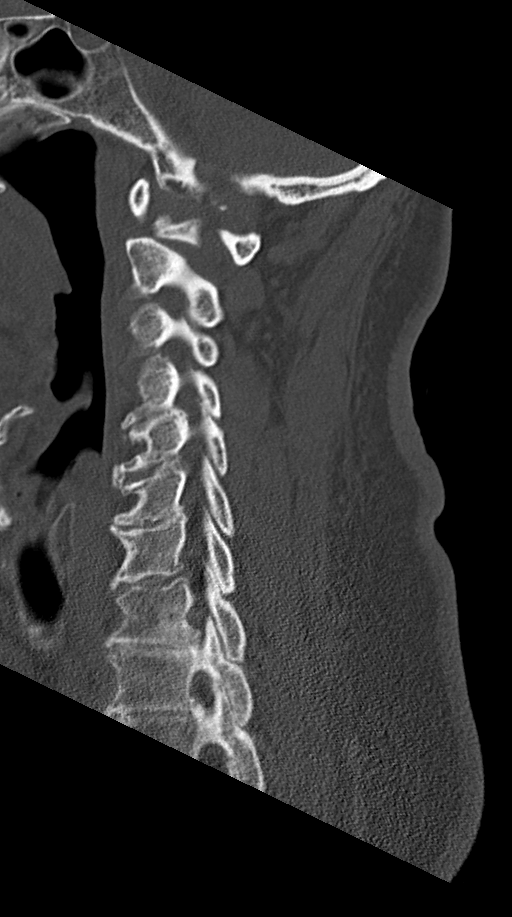

[Series 5: coronal bone · coronal · 0.17mm/px · 3 of 38 slices shown]
[im 8/38  bone]
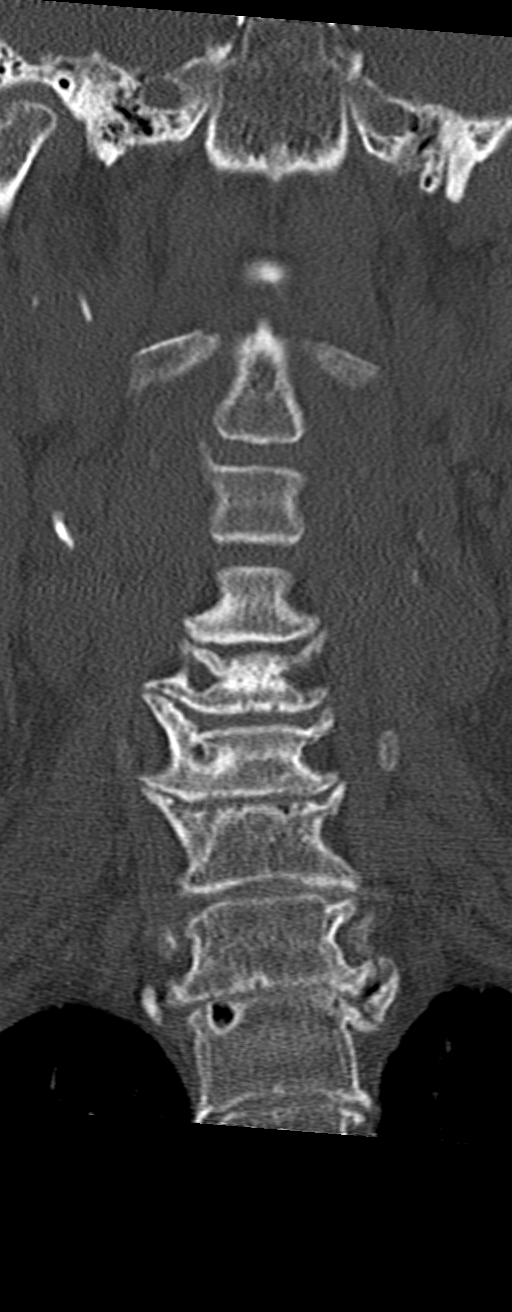
[im 15/38  bone]
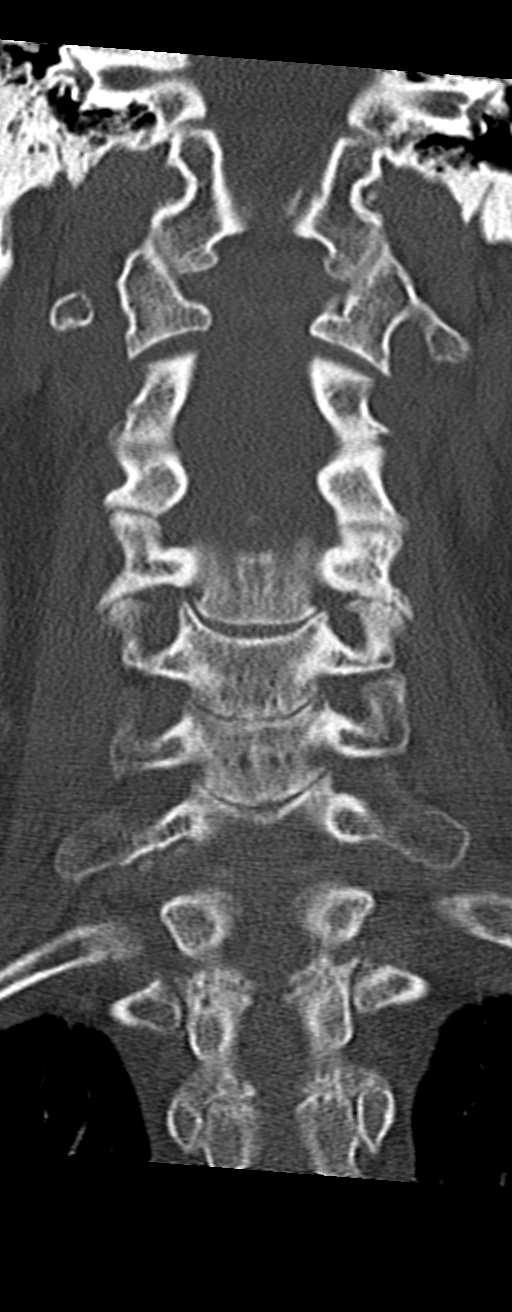
[im 23/38  bone]
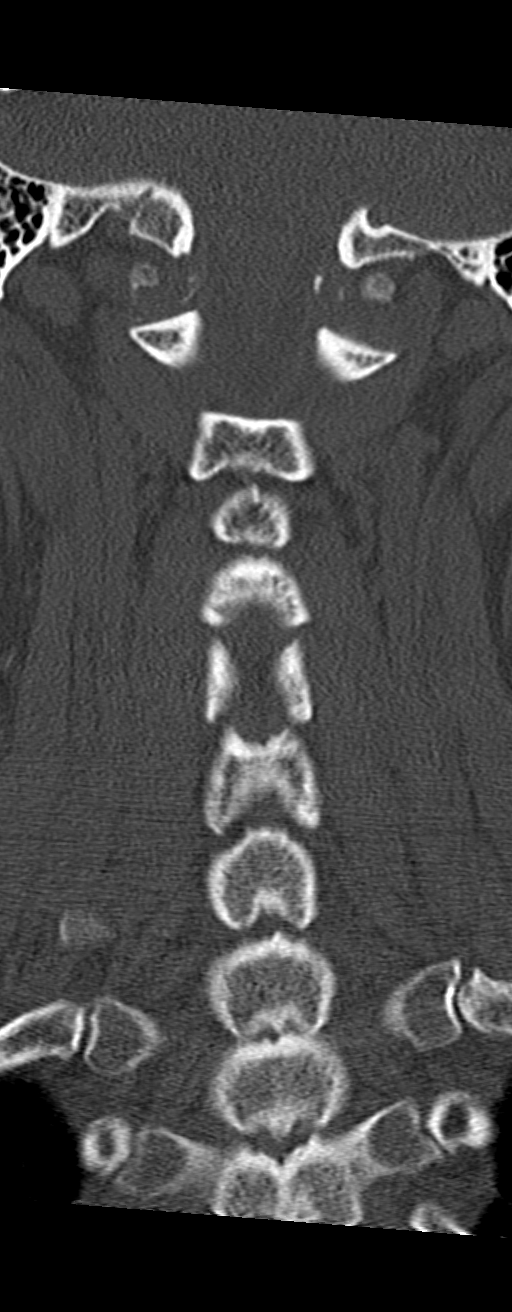

[Series 6: orthogonal bone · axial · 0.19mm/px · z∈[-192,-142]mm · 2 of 86 slices shown, 3 images]
[im 29/86  soft-tissue]
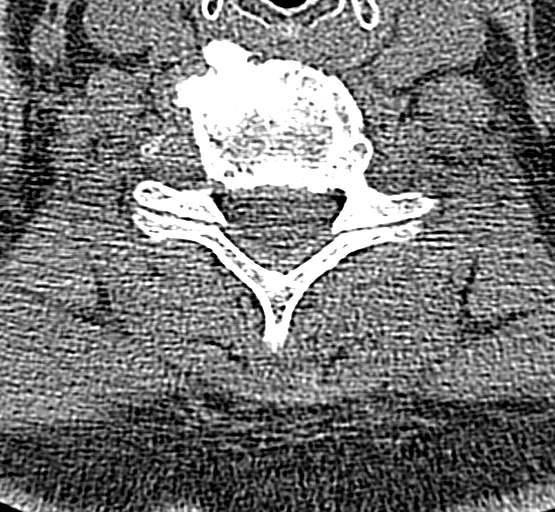
[im 29/86  bone]
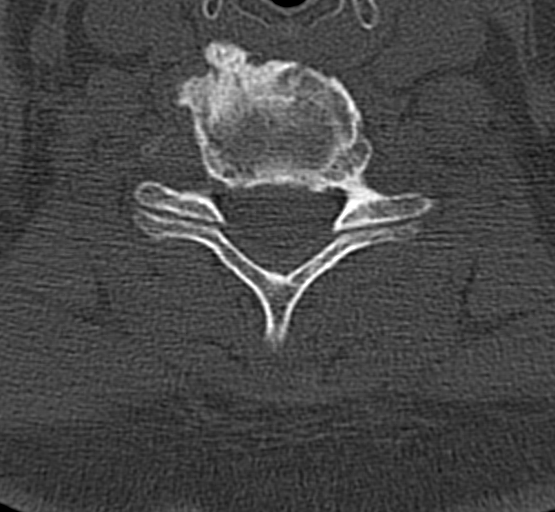
[im 57/86  bone]
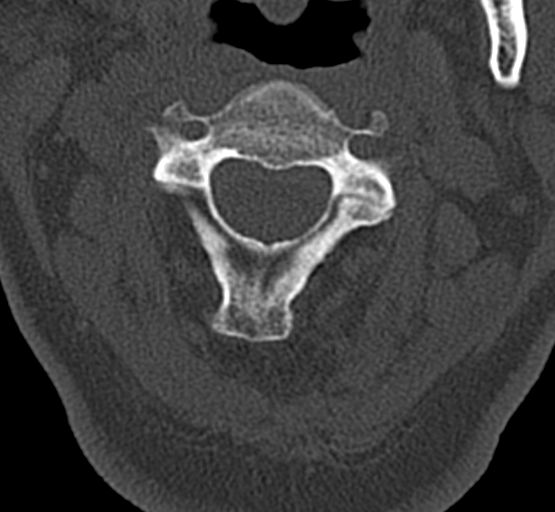

[10 of 33 positions shown; findings below may reference images not displayed]

FINDINGS: CT HEAD FINDINGS

Brain: No acute intracranial hemorrhage. No focal mass lesion. No CT
evidence of acute infarction. No midline shift or mass effect. No
hydrocephalus. Basilar cisterns are patent.

There are periventricular and subcortical white matter
hypodensities. Generalized cortical atrophy.

Vascular: No hyperdense vessel or unexpected calcification.

Skull: Normal. Negative for fracture or focal lesion.

Sinuses/Orbits: Paranasal sinuses and mastoid air cells are clear.
Orbits are clear.

Other: None.

CT CERVICAL SPINE FINDINGS

Alignment: Normal alignment of the cervical vertebral bodies.

Skull base and vertebrae: Normal craniocervical junction. No loss of
vertebral body height or disc height. Normal facet articulation. No
evidence of fracture.

Soft tissues and spinal canal: No prevertebral soft tissue swelling.
No perispinal or epidural hematoma.

Disc levels: Endplate spurring C4-T1. Mild loss disc space height
and endplate sclerosis. No acute findings.

Upper chest: Clear

Other: None
IMPRESSION: 1. No intracranial trauma.
2. Mild atrophy and white matter microvascular disease.
3. No cervical spine fracture.
4. Moderate disc osteophytic disease.

## 2020-04-05 MED ORDER — TRAMADOL HCL 50 MG PO TABS
50.0000 mg | ORAL_TABLET | Freq: Once | ORAL | Status: DC
  Filled 2020-04-05: qty 1

## 2020-04-05 MED ORDER — TRAMADOL HCL 50 MG PO TABS: 50.0000 mg | ORAL_TABLET | Freq: Two times a day (BID) | ORAL | 0 refills | Status: AC | PRN

## 2020-04-05 NOTE — Discharge Instructions (Signed)
Your CT and x-rays did not reveal any acute findings.  Follow discharge care instruction take pain medication as needed.  Follow-up with PCP.

## 2020-04-05 NOTE — ED Provider Notes (Signed)
[facility] Emergency Department Provider Note   ____________________________________________   First MD Initiated Contact with Patient 04/05/20 0710     (approximate)  I have reviewed the triage vital signs and the nursing notes.   HISTORY  Chief Complaint Fall    HPI Carrie Kane is a 79 y.o. female patient complain of headache, neck pain, and coccyx pain secondary to a trip and fall yesterday.  Patient denies LOC.  Patient is taking blood thinners.  Patient also complained of pain to the right hip.  Patient is able to ambulate.  Patient denies vision disturbance, vertigo, or weakness.  Patient rates her pain as 8/10.  Patient described pain is "achy".  No palliative measures for complaint.         Past Medical History:  Diagnosis Date  . CAD (coronary artery disease)    a. 04/2017 Cath: LM 80, LAD 80p, 30m, LCX 95ost, 30m, EF 45-50%; b. 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  . Diastolic dysfunction    a. 04/2017 Echo: EF 55-60%, no rwma, Gr1 DD, mildly dil LA.  Marland Kitchen Hyperlipidemia   . Hypertensive heart disease   . Insulin dependent diabetes mellitus     Patient Active Problem List   Diagnosis Date Noted  . Pre-ulcerative calluses 04/03/2020  . Coronary artery disease of native artery of native heart with stable angina pectoris ([place]) 08/02/2017  . PAD (peripheral artery disease) ([place]) 08/02/2017  . Mixed hyperlipidemia 08/02/2017  . Hx of CABG 08/01/2017  . Diabetes mellitus type 2, insulin dependent ([place]) 04/28/2017  . Bruit 04/28/2017  . Unstable angina ([place]) 04/28/2017  . Abnormal stress test 04/28/2017  . Systolic dysfunction 16/07/9603    Past Surgical History:  Procedure Laterality Date  . ABDOMINAL HYSTERECTOMY    . CORONARY ARTERY BYPASS GRAFT N/A 05/09/2017   Procedure: CORONARY ARTERY BYPASS GRAFTING (CABG) x 3 using left internal mammary artery and right greater saphenous vein harvested endoscopically;  Surgeon: Ivin Poot, MD;  Location: [place];  Service: Open Heart Surgery;  Laterality: N/A;  . INTRAOPERATIVE TRANSESOPHAGEAL ECHOCARDIOGRAM N/A 05/09/2017   Procedure: INTRAOPERATIVE TRANSESOPHAGEAL ECHOCARDIOGRAM;  Surgeon: Ivin Poot, MD;  Location: [place];  Service: Open Heart Surgery;  Laterality: N/A;  . LEFT HEART CATH AND CORONARY ANGIOGRAPHY N/A 05/02/2017   Procedure: Left Heart Cath and Coronary Angiography;  Surgeon: Wellington Hampshire, MD;  Location: [place] [facility];  Service: Cardiovascular;  Laterality: N/A;    Prior to Admission medications   Medication Sig Start Date End Date Taking? Authorizing Provider  aspirin EC 81 MG tablet Take 1 tablet (81 mg total) by mouth daily. 08/02/17   Minna Merritts, MD  atorvastatin (LIPITOR) 40 MG tablet Take 1 tablet (40 mg total) by mouth daily at 6 PM. 06/03/17   Theora Gianotti, NP  BD INSULIN SYRINGE U/F 30G X 1/2" 0.5 ML MISC  03/29/20   [provider]  benazepril-hydrochlorthiazide (LOTENSIN HCT) 20-12.5 MG per tablet Take 1 tablet by mouth 2 (two) times daily.     [provider]  Chlorpheniramine Maleate (ALLERGY PO) Take 1 tablet by mouth daily as needed (allergies).    [provider]  cholecalciferol (VITAMIN D) 1000 units tablet Take 2,000 Units by mouth daily at 12 noon.     [provider]  cloNIDine (CATAPRES) 0.2 MG tablet Take 1 tablet (0.2 mg total) by mouth 2 (two) times daily. 06/03/17   Theora Gianotti, NP  clopidogrel (  PLAVIX) 75 MG tablet TAKE 1 TABLET DAILY 09/25/19   Minna Merritts, MD  ferrous sulfate 325 (65 FE) MG EC tablet Take 325 mg by mouth 2 (two) times a day.    [provider]  Cleda Clarks 100 UNIT/ML KwikPen  02/20/20   [provider]  HUMALOG MIX 75/25 KWIKPEN (75-25) 100 UNIT/ML Claiborne Rigg  01/01/20   [provider]  insulin glargine (LANTUS) 100 UNIT/ML injection Inject 45 Units into the skin at bedtime.    [provider]  levothyroxine (SYNTHROID, LEVOTHROID) 100 MCG tablet Take 100 mcg by mouth daily before breakfast.    [provider]  metoprolol tartrate (LOPRESSOR) 25 MG tablet TAKE 1 TABLET TWICE A DAY (MUST KEEP SCHEDULED APPOINTMENT FOR ADDITIONAL REFILLS, THANK YOU) 03/19/20   Minna Merritts, MD  Multiple Vitamin (MULTIVITAMIN) capsule Take 1 capsule by mouth daily at 12 noon.     [provider]  nitroGLYCERIN (NITROSTAT) 0.4 MG SL tablet Place 1 tablet (0.4 mg total) under the tongue every 5 (five) minutes as needed for chest pain. 06/03/17   Theora Gianotti, NP  Polyethyl Glycol-Propyl Glycol (SYSTANE OP) Apply 1 drop to eye as needed (dry eyes).    [provider]  traMADol (ULTRAM) 50 MG tablet Take 1 tablet (50 mg total) by mouth every 12 (twelve) hours as needed for up to 5 days. 04/05/20 04/10/20  Sable Feil, PA-C    Allergies Flexeril [cyclobenzaprine]  Family History  Problem Relation Age of Onset  . Breast cancer Maternal Aunt     Social History Social History   Tobacco Use  . Smoking status: Never Smoker  . Smokeless tobacco: Never Used  Substance Use Topics  . Alcohol use: No  . Drug use: No    Review of Systems Constitutional: No fever/chills Eyes: No visual changes. ENT: No sore throat. Cardiovascular: Denies chest pain. Respiratory: Denies shortness of breath. Gastrointestinal: No abdominal pain.  No nausea, no vomiting.  No diarrhea.  No constipation. Genitourinary: Negative for dysuria. Musculoskeletal: Neck, hip, and coccyx pain.   Skin: Negative for rash. Neurological: Positive for headaches but denies, focal weakness or numbness. Endocrine:  Diabetes, hyperlipidemia, and hypertension. Allergic/Immunilogical: Flexeril.  ____________________________________________   PHYSICAL EXAM:  VITAL SIGNS: ED Triage Vitals [04/05/20 0505]  Enc Vitals Group     BP (!) 161/68     Pulse Rate (!) 52     Resp 16      Temp 98.8 F (37.1 C)     Temp Source Oral     SpO2 100 %     Weight 178 lb (80.7 kg)     Height 5\' 8"  (1.727 m)     Head Circumference      Peak Flow      Pain Score 8     Pain Loc      Pain Edu?      Excl. in [place]?    Constitutional: Alert and oriented. Well appearing and in no acute distress. Head: Atraumatic. Nose: No congestion/rhinnorhea. Mouth/Throat: Mucous membranes are moist.  Oropharynx non-erythematous. Neck: No cervical spine tenderness to palpation. Hematological/Lymphatic/Immunilogical: No cervical lymphadenopathy. Cardiovascular: Normal rate, regular rhythm. Grossly normal heart sounds.  Good peripheral circulation.  Elevated blood pressure Respiratory: Normal respiratory effort.  No retractions. Lungs CTAB. Musculoskeletal: No lower extremity tenderness nor edema.  No joint effusions. Neurologic:  Normal speech and language. No gross focal neurologic deficits are appreciated. No gait instability. Skin:  Skin is warm,  dry and intact. No rash noted.  Abrasion bilateral elbow. Psychiatric: Mood and affect are normal. Speech and behavior are normal.  ____________________________________________   LABS (all labs ordered are listed, but only abnormal results are displayed)  Labs Reviewed - No data to display ____________________________________________  EKG   ____________________________________________  RADIOLOGY  ED MD interpretation:    Official radiology report(s): DG Sacrum/Coccyx  Result Date: 04/05/2020 CLINICAL DATA:  Fall.  Tailbone injury EXAM: SACRUM AND COCCYX - 2+ VIEW COMPARISON:  None. FINDINGS: No displaced fracture of the sacrum or coccyx identified IMPRESSION: No coccygeal fracture identified Electronically Signed   By: Suzy Bouchard M.D.   On: 04/05/2020 06:24   CT Head Wo Contrast  Result Date: 04/05/2020 CLINICAL DATA:  Fall.  Headache.  Struck head. EXAM: CT HEAD WITHOUT CONTRAST CT CERVICAL SPINE WITHOUT CONTRAST TECHNIQUE:  Multidetector CT imaging of the head and cervical spine was performed following the standard protocol without intravenous contrast. Multiplanar CT image reconstructions of the cervical spine were also generated. COMPARISON:  09/30/2016 FINDINGS: CT HEAD FINDINGS Brain: No acute intracranial hemorrhage. No focal mass lesion. No CT evidence of acute infarction. No midline shift or mass effect. No hydrocephalus. Basilar cisterns are patent. There are periventricular and subcortical white matter hypodensities. Generalized cortical atrophy. Vascular: No hyperdense vessel or unexpected calcification. Skull: Normal. Negative for fracture or focal lesion. Sinuses/Orbits: Paranasal sinuses and mastoid air cells are clear. Orbits are clear. Other: None. CT CERVICAL SPINE FINDINGS Alignment: Normal alignment of the cervical vertebral bodies. Skull base and vertebrae: Normal craniocervical junction. No loss of vertebral body height or disc height. Normal facet articulation. No evidence of fracture. Soft tissues and spinal canal: No prevertebral soft tissue swelling. No perispinal or epidural hematoma. Disc levels: Endplate spurring G9-F6. Mild loss disc space height and endplate sclerosis. No acute findings. Upper chest: Clear Other: None IMPRESSION: 1. No intracranial trauma. 2. Mild atrophy and white matter microvascular disease. 3. No cervical spine fracture. 4. Moderate disc osteophytic disease. Electronically Signed   By: Suzy Bouchard M.D.   On: 04/05/2020 06:34   CT Cervical Spine Wo Contrast  Result Date: 04/05/2020 CLINICAL DATA:  Fall.  Headache.  Struck head. EXAM: CT HEAD WITHOUT CONTRAST CT CERVICAL SPINE WITHOUT CONTRAST TECHNIQUE: Multidetector CT imaging of the head and cervical spine was performed following the standard protocol without intravenous contrast. Multiplanar CT image reconstructions of the cervical spine were also generated. COMPARISON:  09/30/2016 FINDINGS: CT HEAD FINDINGS Brain: No acute  intracranial hemorrhage. No focal mass lesion. No CT evidence of acute infarction. No midline shift or mass effect. No hydrocephalus. Basilar cisterns are patent. There are periventricular and subcortical white matter hypodensities. Generalized cortical atrophy. Vascular: No hyperdense vessel or unexpected calcification. Skull: Normal. Negative for fracture or focal lesion. Sinuses/Orbits: Paranasal sinuses and mastoid air cells are clear. Orbits are clear. Other: None. CT CERVICAL SPINE FINDINGS Alignment: Normal alignment of the cervical vertebral bodies. Skull base and vertebrae: Normal craniocervical junction. No loss of vertebral body height or disc height. Normal facet articulation. No evidence of fracture. Soft tissues and spinal canal: No prevertebral soft tissue swelling. No perispinal or epidural hematoma. Disc levels: Endplate spurring O1-H0. Mild loss disc space height and endplate sclerosis. No acute findings. Upper chest: Clear Other: None IMPRESSION: 1. No intracranial trauma. 2. Mild atrophy and white matter microvascular disease. 3. No cervical spine fracture. 4. Moderate disc osteophytic disease. Electronically Signed   By: Suzy Bouchard M.D.   On: 04/05/2020 06:34  ____________________________________________   PROCEDURES  Procedure(s) performed (including Critical Care):  Procedures   ____________________________________________   INITIAL IMPRESSION / ASSESSMENT AND PLAN / ED COURSE  As part of my medical decision making, I reviewed the following data within the [place]     Patient presents with headache, neck pain, hip pain, coccyx pain secondary to a fall yesterday.  No LOC.  Discussed CT and x-ray findings with patient.  Patient complaining physical exam consistent muscle skeletal pain secondary to fall.  Patient given discharge care instruction advised take medication as directed.  Patient vies follow-up PCP.    Carrie Kane was evaluated in  Emergency Department on 04/05/2020 for the symptoms described in the history of present illness. She was evaluated in the context of the global COVID-19 pandemic, which necessitated consideration that the patient might be at risk for infection with the SARS-CoV-2 virus that causes COVID-19. Institutional protocols and algorithms that pertain to the evaluation of patients at risk for COVID-19 are in a state of rapid change based on information released by regulatory bodies including the CDC and federal and state organizations. These policies and algorithms were followed during the patient's care in the ED.       ____________________________________________   FINAL CLINICAL IMPRESSION(S) / ED DIAGNOSES  Final diagnoses:  Fall, initial encounter  Musculoskeletal pain     ED Discharge Orders         Ordered    traMADol (ULTRAM) 50 MG tablet  Every 12 hours PRN     Discontinue  Reprint     04/05/20 0716           Note:  This document was prepared using Dragon voice recognition software and may include unintentional dictation errors.    Sable Feil, PA-C 04/05/20 9311    Delman Kitten, MD 04/06/20 0005

## 2020-04-05 NOTE — ED Triage Notes (Signed)
Pt to ED after a fall on the street yesterday. Pt reports hitting her head and tailbone. With pain in her tailbone. Pt reports stiffness in neck and headache. Pt denies LOC but is taking blood thinners.   PT has right sided aching in her hip and thigh. No obvious deformity but has been able to bear weight since.

## 2020-04-10 ENCOUNTER — Other Ambulatory Visit: Payer: Self-pay

## 2020-04-10 DIAGNOSIS — E039 Hypothyroidism, unspecified: Secondary | ICD-10-CM

## 2020-04-10 MED ORDER — LEVOTHYROXINE SODIUM 100 MCG PO TABS: 100.0000 ug | ORAL_TABLET | Freq: Every day | ORAL | 3 refills | Status: DC

## 2020-05-06 ENCOUNTER — Ambulatory Visit: Payer: Medicare Other | Admitting: Internal Medicine

## 2020-05-13 ENCOUNTER — Ambulatory Visit: Payer: Medicare Other | Admitting: Internal Medicine

## 2020-06-05 ENCOUNTER — Ambulatory Visit: Payer: Medicare Other | Admitting: Podiatry

## 2020-06-10 ENCOUNTER — Other Ambulatory Visit: Payer: Self-pay

## 2020-06-10 ENCOUNTER — Ambulatory Visit: Payer: Medicare Other | Admitting: Internal Medicine

## 2020-06-12 ENCOUNTER — Encounter: Payer: Self-pay | Admitting: Podiatry

## 2020-06-12 ENCOUNTER — Other Ambulatory Visit: Payer: Self-pay

## 2020-06-12 ENCOUNTER — Ambulatory Visit: Payer: Medicare Other | Admitting: Podiatry

## 2020-06-12 DIAGNOSIS — L84 Corns and callosities: Secondary | ICD-10-CM

## 2020-06-12 DIAGNOSIS — L02612 Cutaneous abscess of left foot: Secondary | ICD-10-CM

## 2020-06-12 MED ORDER — DOXYCYCLINE HYCLATE 100 MG PO TABS
100.0000 mg | ORAL_TABLET | Freq: Two times a day (BID) | ORAL | 0 refills | Status: DC
Start: 2020-06-12 — End: 2020-06-25

## 2020-06-12 NOTE — Progress Notes (Signed)
This patient presents the office for continued evaluation and treatment of a callus on her left forefoot.  This patient presents the office stating she is not having any pain or discomfort in the callus on her left forefoot.  She is returning for her regular 79-monthtreatment.   She is found to have a dry painless discolored skin extending toward  the heel left foot.  She is also found to have a fluctuant abscess extending to the toes on her left foot from pre-ulcerous callus..  She denies any pain or discomfort at the site of the abscess or preulcerous callus.  She presents for preventive footcare services   Vascular  Dorsalis pedis and posterior tibial pulses are not  palpable  B/L.  Capillary return  WNL.  Temperature gradient is  WNL.  Skin turgor  WNL  Sensorium  Senn Weinstein monofilament wire diminished.. Diminished  tactile sensation.  Nail Exam  Patient has thickened  nails with no evidence of bacterial or fungal infection.  Orthopedic  Exam  Muscle tone and muscle strength  WNL.  No limitations of motion feet  B/L.  No crepitus or joint effusion noted.  HAV  B/L.  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   Skin  No open lesions.  Normal skin texture and turgor.  Sub 2 pre-ulcerous  callus left forefoot.  Dry healing blister noted proximal to pre-ulcerous callus sub 2 left foot.  Fluctuant abscess distal to the preulcerous callus sub 2 left foot.   Closed lesion with no redness, swelling or infection.    Callus sub 2 left foot secondary to hammer toes 2,3  Left foot.  Debridement of pre-ulcerous callus sub 2 right foot with # 15 blade.  .  Discussed dry blister and told her to allow this discolored site to heal.  Fluctuant abscess distal to pre-ulcerous callus was incised and drained.  Thick brown drainage was noted.  No evidence of acute infection noted or redness or pain.  New ulcer site measured 15 mm. X 20 mm.  Neosporin/DSD.  Told patient to keep her left foot  bandaged and did not tell her to soak her foot.  Prescribe doxycycline.  RTC 1 week for evaluation by Dr.  PPosey Pronto     GGardiner BarefootDPM

## 2020-06-17 ENCOUNTER — Other Ambulatory Visit: Payer: Self-pay

## 2020-06-17 ENCOUNTER — Encounter: Payer: Self-pay | Admitting: Internal Medicine

## 2020-06-17 ENCOUNTER — Ambulatory Visit (INDEPENDENT_AMBULATORY_CARE_PROVIDER_SITE_OTHER): Payer: Medicare Other | Admitting: Internal Medicine

## 2020-06-17 VITALS — BP 137/51 | HR 55 | Ht 71.0 in | Wt 159.7 lb

## 2020-06-17 DIAGNOSIS — E782 Mixed hyperlipidemia: Secondary | ICD-10-CM | POA: Diagnosis not present

## 2020-06-17 DIAGNOSIS — R634 Abnormal weight loss: Secondary | ICD-10-CM | POA: Diagnosis not present

## 2020-06-17 DIAGNOSIS — D500 Iron deficiency anemia secondary to blood loss (chronic): Secondary | ICD-10-CM

## 2020-06-17 DIAGNOSIS — Z794 Long term (current) use of insulin: Secondary | ICD-10-CM

## 2020-06-17 DIAGNOSIS — Z1211 Encounter for screening for malignant neoplasm of colon: Secondary | ICD-10-CM

## 2020-06-17 DIAGNOSIS — D649 Anemia, unspecified: Secondary | ICD-10-CM | POA: Insufficient documentation

## 2020-06-17 DIAGNOSIS — E119 Type 2 diabetes mellitus without complications: Secondary | ICD-10-CM

## 2020-06-17 NOTE — Progress Notes (Signed)
Established Patient Office Visit  SUBJECTIVE:  Subjective  Patient ID: Carrie Kane, female    DOB: April 24, 1941  Age: 79 y.o. MRN: [id number]  CC:  Chief Complaint  Patient presents with  . lab results    HPI Carrie Kane is a 79 y.o. female presenting today for a review of her recent labs.  She has lab work completed from [name]. Her CBC was normal except for RBC at 3.57, Hemoglobin at 10.9, and HCT at 32.6. Her CMP was normal except for Glucose at 199, . Her TSH was low at 0.422. Her A1C was abnormal at 8.1.   She notes that she is taking oral iron but she is concerned that her stool is black and that she has developed some constipation.   She is vaccinated against COVID19.   Past Medical History:  Diagnosis Date  . CAD (coronary artery disease)    a. 04/2017 Cath: LM 80, LAD 80p, 60m, LCX 95ost, 62m, EF 45-50%; b. 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  . Diastolic dysfunction    a. 04/2017 Echo: EF 55-60%, no rwma, Gr1 DD, mildly dil LA.  Marland Kitchen Hyperlipidemia   . Hypertensive heart disease   . Insulin dependent diabetes mellitus     Past Surgical History:  Procedure Laterality Date  . ABDOMINAL HYSTERECTOMY    . CORONARY ARTERY BYPASS GRAFT N/A 05/09/2017   Procedure: CORONARY ARTERY BYPASS GRAFTING (CABG) x 3 using left internal mammary artery and right greater saphenous vein harvested endoscopically;  Surgeon: Ivin Poot, MD;  Location: [facility];  Service: Open Heart Surgery;  Laterality: N/A;  . INTRAOPERATIVE TRANSESOPHAGEAL ECHOCARDIOGRAM N/A 05/09/2017   Procedure: INTRAOPERATIVE TRANSESOPHAGEAL ECHOCARDIOGRAM;  Surgeon: Ivin Poot, MD;  Location: [facility];  Service: Open Heart Surgery;  Laterality: N/A;  . LEFT HEART CATH AND CORONARY ANGIOGRAPHY N/A 05/02/2017   Procedure: Left Heart Cath and Coronary Angiography;  Surgeon: Wellington Hampshire, MD;  Location: [facility];  Service: Cardiovascular;  Laterality: N/A;    Family History  Problem  Relation Age of Onset  . Breast cancer Maternal Aunt     Social History   Socioeconomic History  . Marital status: Married    Spouse name: Not on file  . Number of children: Not on file  . Years of education: Not on file  . Highest education level: Not on file  Occupational History  . Not on file  Tobacco Use  . Smoking status: Never Smoker  . Smokeless tobacco: Never Used  Substance and Sexual Activity  . Alcohol use: No  . Drug use: No  . Sexual activity: Not on file  Other Topics Concern  . Not on file  Social History Narrative  . Not on file   Social Determinants of Health   Financial Resource Strain:   . Difficulty of Paying Living Expenses: Not on file  Food Insecurity:   . Worried About Charity fundraiser in the Last Year: Not on file  . Ran Out of Food in the Last Year: Not on file  Transportation Needs:   . Lack of Transportation (Medical): Not on file  . Lack of Transportation (Non-Medical): Not on file  Physical Activity:   . Days of Exercise per Week: Not on file  . Minutes of Exercise per Session: Not on file  Stress:   . Feeling of Stress : Not on file  Social Connections:   . Frequency of Communication with Friends and Family: Not on file  .  Frequency of Social Gatherings with Friends and Family: Not on file  . Attends Religious Services: Not on file  . Active Member of Clubs or Organizations: Not on file  . Attends Archivist Meetings: Not on file  . Marital Status: Not on file  Intimate Partner Violence:   . Fear of Current or Ex-Partner: Not on file  . Emotionally Abused: Not on file  . Physically Abused: Not on file  . Sexually Abused: Not on file     Current Outpatient Medications:  .  aspirin EC 81 MG tablet, Take 1 tablet (81 mg total) by mouth daily., Disp: 90 tablet, Rfl: 3 .  atorvastatin (LIPITOR) 40 MG tablet, Take 1 tablet (40 mg total) by mouth daily at 6 PM., Disp: 90 tablet, Rfl: 1 .  BD INSULIN SYRINGE U/F 30G X  1/2" 0.5 ML MISC, , Disp: , Rfl:  .  benazepril-hydrochlorthiazide (LOTENSIN HCT) 20-12.5 MG per tablet, Take 1 tablet by mouth 2 (two) times daily. , Disp: , Rfl:  .  Chlorpheniramine Maleate (ALLERGY PO), Take 1 tablet by mouth daily as needed (allergies)., Disp: , Rfl:  .  cholecalciferol (VITAMIN D) 1000 units tablet, Take 2,000 Units by mouth daily at 12 noon. , Disp: , Rfl:  .  cloNIDine (CATAPRES) 0.2 MG tablet, Take 1 tablet (0.2 mg total) by mouth 2 (two) times daily., Disp: 90 tablet, Rfl: 1 .  clopidogrel (PLAVIX) 75 MG tablet, TAKE 1 TABLET DAILY, Disp: 90 tablet, Rfl: 0 .  doxycycline (VIBRA-TABS) 100 MG tablet, Take 1 tablet (100 mg total) by mouth 2 (two) times daily., Disp: 20 tablet, Rfl: 0 .  ferrous sulfate 325 (65 FE) MG EC tablet, Take 325 mg by mouth 2 (two) times a day., Disp: , Rfl:  .  HUMALOG KWIKPEN 100 UNIT/ML KwikPen, , Disp: , Rfl:  .  HUMALOG MIX 75/25 KWIKPEN (75-25) 100 UNIT/ML Kwikpen, , Disp: , Rfl:  .  insulin glargine (LANTUS) 100 UNIT/ML injection, Inject 45 Units into the skin at bedtime., Disp: , Rfl:  .  levothyroxine (SYNTHROID) 100 MCG tablet, Take 1 tablet (100 mcg total) by mouth daily before breakfast., Disp: 90 tablet, Rfl: 3 .  metoprolol tartrate (LOPRESSOR) 25 MG tablet, TAKE 1 TABLET TWICE A DAY (MUST KEEP SCHEDULED APPOINTMENT FOR ADDITIONAL REFILLS, THANK YOU), Disp: 180 tablet, Rfl: 2 .  Multiple Vitamin (MULTIVITAMIN) capsule, Take 1 capsule by mouth daily at 12 noon. , Disp: , Rfl:  .  nitroGLYCERIN (NITROSTAT) 0.4 MG SL tablet, Place 1 tablet (0.4 mg total) under the tongue every 5 (five) minutes as needed for chest pain., Disp: 30 tablet, Rfl: 12 .  Polyethyl Glycol-Propyl Glycol (SYSTANE OP), Apply 1 drop to eye as needed (dry eyes)., Disp: , Rfl:    Allergies  Allergen Reactions  . Flexeril [Cyclobenzaprine] Hypertension    ROS Review of Systems  Constitutional: Positive for unexpected weight change (down 18 lbs). Negative for  appetite change.  HENT: Negative.   Eyes: Negative.   Respiratory: Negative.   Cardiovascular: Negative.   Gastrointestinal: Positive for constipation.       Black stool, likely secondary to iron supplementation  Endocrine: Negative.   Genitourinary: Negative.   Musculoskeletal: Negative.   Skin: Negative.   Allergic/Immunologic: Negative.   Neurological: Negative.   Hematological: Negative.   Psychiatric/Behavioral: Negative.   All other systems reviewed and are negative.    OBJECTIVE:    Physical Exam Vitals reviewed.  Constitutional:  Appearance: Normal appearance.  HENT:     Mouth/Throat:     Mouth: Mucous membranes are moist.  Eyes:     Pupils: Pupils are equal, round, and reactive to light.  Neck:     Vascular: No carotid bruit.  Cardiovascular:     Rate and Rhythm: Normal rate and regular rhythm.     Pulses: Normal pulses.     Heart sounds: Normal heart sounds.  Pulmonary:     Effort: Pulmonary effort is normal.     Breath sounds: Normal breath sounds.  Abdominal:     General: Bowel sounds are normal.     Palpations: Abdomen is soft. There is no hepatomegaly, splenomegaly or mass.     Tenderness: There is no abdominal tenderness.     Hernia: No hernia is present.  Musculoskeletal:        General: No tenderness.     Cervical back: Neck supple.     Right lower leg: No edema.     Left lower leg: No edema.  Skin:    Findings: No rash.  Neurological:     Mental Status: She is alert and oriented to person, place, and time.     Motor: No weakness.  Psychiatric:        Mood and Affect: Mood and affect normal.        Behavior: Behavior normal.     BP (!) 137/51   Pulse (!) 55   Ht 5\' 11"  (1.803 m)   Wt 159 lb 11.2 oz (72.4 kg)   BMI 22.27 kg/m  Wt Readings from Last 3 Encounters:  06/17/20 159 lb 11.2 oz (72.4 kg)  04/05/20 177 lb 14.6 oz (80.7 kg)  12/25/19 178 lb (80.7 kg)    Health Maintenance Due  Topic Date Due  . Hepatitis C Screening   Never done  . OPHTHALMOLOGY EXAM  Never done  . COVID-19 Vaccine (1) Never done  . TETANUS/TDAP  Never done  . PNA vac Low Risk Adult (1 of 2 - PCV13) Never done  . HEMOGLOBIN A1C  11/03/2017  . INFLUENZA VACCINE  05/18/2020    There are no preventive care reminders to display for this patient.  CBC Latest Ref Rng & Units 05/13/2017 05/12/2017 05/11/2017  WBC 4.0 - 10.5 K/uL 10.2 14.1(H) 15.4(H)  Hemoglobin 12.0 - 15.0 g/dL 10.3(L) 11.2(L) 9.8(L)  Hematocrit 36 - 46 % 31.0(L) 33.6(L) 29.7(L)  Platelets 150 - 400 K/uL 198 181 154   CMP Latest Ref Rng & Units 06/03/2017 05/13/2017 05/12/2017  Glucose 65 - 99 mg/dL 239(H) 175(H) 159(H)  BUN 8 - 27 mg/dL 21 17 16   Creatinine 0.57 - 1.00 mg/dL 1.07(H) 0.93 0.92  Sodium 134 - 144 mmol/L 138 135 136  Potassium 3.5 - 5.2 mmol/L 4.3 3.3(L) 3.3(L)  Chloride 96 - 106 mmol/L 98 100(L) 100(L)  CO2 20 - 29 mmol/L 23 28 25   Calcium 8.7 - 10.3 mg/dL 9.5 8.2(L) 8.7(L)    Lab Results  Component Value Date   TSH 0.372 05/03/2017   Lab Results  Component Value Date   ANIONGAP 7 05/13/2017   Lab Results  Component Value Date   CHOL 155 05/03/2017   HDL 55 05/03/2017   LDLCALC 81 05/03/2017   CHOLHDL 2.8 05/03/2017   Lab Results  Component Value Date   TRIG 93 05/03/2017   Lab Results  Component Value Date   HGBA1C 8.4 (H) 05/03/2017      ASSESSMENT & PLAN:   Problem List  Items Addressed This Visit      Endocrine   Diabetes mellitus type 2, insulin dependent ([place]) - Primary    - The patient's blood sugar is not under control on humalog, lantus, and lopressor. - The patient will continue the current treatment regimen.  - I encouraged the patient to regularly check blood sugar.  - I encouraged the patient to monitor diet. I encouraged the patient to eat low-carb and low-sugar to help prevent blood sugar spikes.  - I encouraged the patient to continue following their prescribed treatment plan for diabetes - I informed the patient to  get help if blood sugar drops below 54mg /dL, or if suddenly have trouble thinking clearly or breathing.            Other   Mixed hyperlipidemia    - The patient's hyperlipidemia is stable on lipitor. - The patient will continue the current treatment regimen.  - I encouraged the patient to eat more vegetables and whole wheat, and to avoid fatty foods like whole milk, hard cheese, egg yolks, margarine, baked sweets, and fried foods.  - I encouraged the patient to live an active lifestyle and complete activities for 40 minutes at least three times per week.  - I instructed the patient to go to the ER if they begin having chest pain.        Weight loss    Pt has lost 18 lbs in the last two months. She reports a good appetite. She still has anemia and complains of constipation and black stool, but she is taking oral iron supplementation. Encouraged to increase oral iron intake and return in three months for recheck. Will refer to GI and hematology.      Iron deficiency anemia due to chronic blood loss    Pt is currently on oral iron; she complains of black stool, constipation, and rapid weight change. I told her to increase her oral iron to twice per day and return in 3 months. I am referring her to GI for a colonoscopy and to hematology for consult.          No orders of the defined types were placed in this encounter.   Follow-up: No follow-ups on file.    Dr. Jane Canary [facility] [address] [place],  [address]   By signing my name below, I, [employer], attest that this documentation has been prepared under the direction and in the presence of Cletis Athens, MD. Electronically Signed: Cletis Athens, MD 06/17/20, 11:51 AM   I personally performed the services described in this documentation, which was SCRIBED in my presence. The recorded information has been reviewed and considered accurate. It has been edited as necessary during review. Cletis Athens, MD

## 2020-06-17 NOTE — Addendum Note (Signed)
Addended by: Alois Cliche on: 06/17/2020 02:43 PM   Modules accepted: Orders

## 2020-06-17 NOTE — Assessment & Plan Note (Addendum)
Pt has lost 18 lbs in the last two months. She reports a good appetite. She still has anemia and complains of constipation and black stool, but she is taking oral iron supplementation. Encouraged to increase oral iron intake and return in three months for recheck. Will refer to GI and hematology.

## 2020-06-17 NOTE — Assessment & Plan Note (Signed)
-   The patient's blood sugar is not under control on humalog, lantus, and lopressor. - The patient will continue the current treatment regimen.  - I encouraged the patient to regularly check blood sugar.  - I encouraged the patient to monitor diet. I encouraged the patient to eat low-carb and low-sugar to help prevent blood sugar spikes.  - I encouraged the patient to continue following their prescribed treatment plan for diabetes - I informed the patient to get help if blood sugar drops below 54mg /dL, or if suddenly have trouble thinking clearly or breathing.

## 2020-06-17 NOTE — Assessment & Plan Note (Signed)
-   The patient's hyperlipidemia is stable on lipitor. - The patient will continue the current treatment regimen.  - I encouraged the patient to eat more vegetables and whole wheat, and to avoid fatty foods like whole milk, hard cheese, egg yolks, margarine, baked sweets, and fried foods.  - I encouraged the patient to live an active lifestyle and complete activities for 40 minutes at least three times per week.  - I instructed the patient to go to the ER if they begin having chest pain.   

## 2020-06-17 NOTE — Assessment & Plan Note (Signed)
Pt is currently on oral iron; she complains of black stool, constipation, and rapid weight change. I told her to increase her oral iron to twice per day and return in 3 months. I am referring her to GI for a colonoscopy and to hematology for consult.

## 2020-06-18 ENCOUNTER — Other Ambulatory Visit: Payer: Self-pay

## 2020-06-18 MED ORDER — BENAZEPRIL-HYDROCHLOROTHIAZIDE 20-12.5 MG PO TABS: 1.0000 | ORAL_TABLET | Freq: Two times a day (BID) | ORAL | 2 refills | Status: DC

## 2020-06-21 NOTE — Progress Notes (Signed)
[facility]  Telephone:(336) [phone] Fax:([phone]) [phone]  ID: Marc Sivertsen OB: November 27, 1940  MR#: [id number]  PYK#:[id number]  Patient Care Team: Cletis Athens, MD as PCP - General (Internal Medicine) Minna Merritts, MD as Consulting Physician (Cardiology)  CHIEF COMPLAINT: Iron deficiency anemia.  INTERVAL HISTORY: Patient is a 79 year old female who presented to her primary care physician complaining of black tarry stools.  She is noted to have heme positive stools and was referred to GI for colonoscopy as well as to hematology for consideration of IV iron.  She currently feels well and is asymptomatic.  She has no neurologic complaints.  She denies any recent fevers or illnesses.  She has good appetite and denies weight loss.  She has no chest pain, shortness of breath, cough, or hemoptysis.  She denies any nausea, vomiting, constipation, or diarrhea.  She does not complain of any further melena or hematochezia.  She has no urinary complaints.  Patient offers no specific complaints today.  REVIEW OF SYSTEMS:   Review of Systems  Constitutional: Negative.  Negative for fever, malaise/fatigue and weight loss.  Respiratory: Negative.  Negative for cough and shortness of breath.   Cardiovascular: Negative.  Negative for chest pain and leg swelling.  Gastrointestinal: Negative for abdominal pain, blood in stool and melena.  Genitourinary: Negative.  Negative for hematuria.  Musculoskeletal: Negative.  Negative for back pain.  Skin: Negative.  Negative for rash.  Neurological: Negative.  Negative for dizziness, focal weakness, weakness and headaches.  Psychiatric/Behavioral: Negative.  The patient is not nervous/anxious.     As per HPI. Otherwise, a complete review of systems is negative.  PAST MEDICAL HISTORY: Past Medical History:  Diagnosis Date  . CAD (coronary artery disease)    a. 04/2017 Cath: LM 80, LAD 80p, 31m, LCX 95ost, 57m, EF 45-50%; b. 04/2017 CABG x 3  (LIMA->LAD, VG->Diag, VG->OM).  . Diastolic dysfunction    a. 04/2017 Echo: EF 55-60%, no rwma, Gr1 DD, mildly dil LA.  Marland Kitchen Hyperlipidemia   . Hypertensive heart disease   . Insulin dependent diabetes mellitus     PAST SURGICAL HISTORY: Past Surgical History:  Procedure Laterality Date  . ABDOMINAL HYSTERECTOMY    . CORONARY ARTERY BYPASS GRAFT N/A 05/09/2017   Procedure: CORONARY ARTERY BYPASS GRAFTING (CABG) x 3 using left internal mammary artery and right greater saphenous vein harvested endoscopically;  Surgeon: Ivin Poot, MD;  Location: [facility];  Service: Open Heart Surgery;  Laterality: N/A;  . INTRAOPERATIVE TRANSESOPHAGEAL ECHOCARDIOGRAM N/A 05/09/2017   Procedure: INTRAOPERATIVE TRANSESOPHAGEAL ECHOCARDIOGRAM;  Surgeon: Ivin Poot, MD;  Location: [facility];  Service: Open Heart Surgery;  Laterality: N/A;  . LEFT HEART CATH AND CORONARY ANGIOGRAPHY N/A 05/02/2017   Procedure: Left Heart Cath and Coronary Angiography;  Surgeon: Wellington Hampshire, MD;  Location: [facility];  Service: Cardiovascular;  Laterality: N/A;    FAMILY HISTORY: Family History  Problem Relation Age of Onset  . Breast cancer Maternal Aunt     ADVANCED DIRECTIVES (Y/N):  N  HEALTH MAINTENANCE: Social History   Tobacco Use  . Smoking status: Never Smoker  . Smokeless tobacco: Never Used  Substance Use Topics  . Alcohol use: No  . Drug use: No     Colonoscopy:  PAP:  Bone density:  Lipid panel:  Allergies  Allergen Reactions  . Flexeril [Cyclobenzaprine] Hypertension    Current Outpatient Medications  Medication Sig Dispense Refill  . aspirin EC 81 MG tablet Take 1 tablet (  81 mg total) by mouth daily. 90 tablet 3  . atorvastatin (LIPITOR) 40 MG tablet Take 1 tablet (40 mg total) by mouth daily at 6 PM. 90 tablet 1  . BD INSULIN SYRINGE U/F 30G X 1/2" 0.5 ML MISC     . benazepril-hydrochlorthiazide (LOTENSIN HCT) 20-12.5 MG tablet Take 1 tablet by mouth 2 (two) times daily. 180  tablet 2  . Chlorpheniramine Maleate (ALLERGY PO) Take 1 tablet by mouth daily as needed (allergies).    . cholecalciferol (VITAMIN D) 1000 units tablet Take 2,000 Units by mouth daily at 12 noon.     . cloNIDine (CATAPRES) 0.2 MG tablet Take 1 tablet (0.2 mg total) by mouth 2 (two) times daily. 90 tablet 1  . clopidogrel (PLAVIX) 75 MG tablet TAKE 1 TABLET DAILY 90 tablet 0  . ferrous sulfate 325 (65 FE) MG EC tablet Take 325 mg by mouth 2 (two) times a day.    Marland Kitchen HUMALOG KWIKPEN 100 UNIT/ML KwikPen     . HUMALOG MIX 75/25 KWIKPEN (75-25) 100 UNIT/ML Kwikpen     . insulin glargine (LANTUS) 100 UNIT/ML injection Inject 45 Units into the skin at bedtime.    Marland Kitchen levothyroxine (SYNTHROID) 100 MCG tablet Take 1 tablet (100 mcg total) by mouth daily before breakfast. 90 tablet 3  . metoprolol tartrate (LOPRESSOR) 25 MG tablet TAKE 1 TABLET TWICE A DAY (MUST KEEP SCHEDULED APPOINTMENT FOR ADDITIONAL REFILLS, THANK YOU) 180 tablet 2  . Multiple Vitamin (MULTIVITAMIN) capsule Take 1 capsule by mouth daily at 12 noon.     . nitroGLYCERIN (NITROSTAT) 0.4 MG SL tablet Place 1 tablet (0.4 mg total) under the tongue every 5 (five) minutes as needed for chest pain. 30 tablet 12  . Polyethyl Glycol-Propyl Glycol (SYSTANE OP) Apply 1 drop to eye as needed (dry eyes).     No current facility-administered medications for this visit.    OBJECTIVE: Vitals:   06/25/20 1128  BP: (!) 211/78  Pulse: 75  Temp: 97.6 F (36.4 C)  SpO2: 100%     Body mass index is 24.08 kg/m.    ECOG FS:0 - Asymptomatic  General: Well-developed, well-nourished, no acute distress. Eyes: Pink conjunctiva, anicteric sclera. HEENT: Normocephalic, moist mucous membranes. Lungs: No audible wheezing or coughing. Heart: Regular rate and rhythm. Abdomen: Soft, nontender, no obvious distention. Musculoskeletal: No edema, cyanosis, or clubbing. Neuro: Alert, answering all questions appropriately. Cranial nerves grossly intact. Skin: No  rashes or petechiae noted. Psych: Normal affect. Lymphatics: No cervical, calvicular, axillary or inguinal LAD.   LAB RESULTS:  Lab Results  Component Value Date   NA 138 06/03/2017   K 4.3 06/03/2017   CL 98 06/03/2017   CO2 23 06/03/2017   GLUCOSE 239 (H) 06/03/2017   BUN 21 06/03/2017   CREATININE 1.07 (H) 06/03/2017   CALCIUM 9.5 06/03/2017   GFRNONAA 51 (L) 06/03/2017   GFRAA 59 (L) 06/03/2017    Lab Results  Component Value Date   WBC 4.7 06/25/2020   HGB 11.2 (L) 06/25/2020   HCT 33.7 (L) 06/25/2020   MCV 88.7 06/25/2020   PLT 204 06/25/2020   Lab Results  Component Value Date   IRON 90 06/25/2020   TIBC 281 06/25/2020   IRONPCTSAT 32 (H) 06/25/2020   Lab Results  Component Value Date   FERRITIN 270 06/25/2020     STUDIES: No results found.  ASSESSMENT: Iron deficiency anemia.  PLAN:    1. Iron deficiency anemia: Patient reportedly had heme positive stools  and has an appointment with GI for consideration of colonoscopy and EGD in the next 1 to 2 months.  Her hemoglobin has improved to 11.2 and her iron stores are now within normal limits.  B12, folate, and hemolysis labs are also within normal limits.  CEA is 1.9.  No intervention is needed at this time.  Patient does not require IV iron.  Patient will have video assisted telemedicine visit in 1 week to discuss her results.  I spent a total of 45 minutes reviewing chart data, face-to-face evaluation with the patient, counseling and coordination of care as detailed above.   Patient expressed understanding and was in agreement with this plan. She also understands that She can call clinic at any time with any questions, concerns, or complaints.   Cancer Staging No matching staging information was found for the patient.  Lloyd Huger, MD   06/26/2020 4:31 PM

## 2020-06-24 ENCOUNTER — Encounter: Payer: Self-pay | Admitting: Podiatry

## 2020-06-24 ENCOUNTER — Other Ambulatory Visit: Payer: Self-pay

## 2020-06-24 ENCOUNTER — Ambulatory Visit: Payer: Medicare Other | Admitting: Podiatry

## 2020-06-24 DIAGNOSIS — E119 Type 2 diabetes mellitus without complications: Secondary | ICD-10-CM | POA: Diagnosis not present

## 2020-06-24 DIAGNOSIS — Z794 Long term (current) use of insulin: Secondary | ICD-10-CM

## 2020-06-24 DIAGNOSIS — L97521 Non-pressure chronic ulcer of other part of left foot limited to breakdown of skin: Secondary | ICD-10-CM | POA: Diagnosis not present

## 2020-06-25 ENCOUNTER — Inpatient Hospital Stay: Payer: Medicare Other

## 2020-06-25 ENCOUNTER — Encounter: Payer: Self-pay | Admitting: Podiatry

## 2020-06-25 ENCOUNTER — Inpatient Hospital Stay: Payer: Medicare Other | Attending: Oncology | Admitting: Oncology

## 2020-06-25 ENCOUNTER — Encounter: Payer: Self-pay | Admitting: Oncology

## 2020-06-25 VITALS — BP 211/78 | HR 75 | Temp 97.6°F | Ht 68.0 in | Wt 158.4 lb

## 2020-06-25 DIAGNOSIS — D500 Iron deficiency anemia secondary to blood loss (chronic): Secondary | ICD-10-CM | POA: Diagnosis not present

## 2020-06-25 DIAGNOSIS — Z794 Long term (current) use of insulin: Secondary | ICD-10-CM | POA: Diagnosis not present

## 2020-06-25 DIAGNOSIS — Z79899 Other long term (current) drug therapy: Secondary | ICD-10-CM | POA: Diagnosis not present

## 2020-06-25 DIAGNOSIS — E119 Type 2 diabetes mellitus without complications: Secondary | ICD-10-CM | POA: Diagnosis not present

## 2020-06-25 DIAGNOSIS — I2510 Atherosclerotic heart disease of native coronary artery without angina pectoris: Secondary | ICD-10-CM | POA: Insufficient documentation

## 2020-06-25 DIAGNOSIS — R7989 Other specified abnormal findings of blood chemistry: Secondary | ICD-10-CM | POA: Insufficient documentation

## 2020-06-25 DIAGNOSIS — Z803 Family history of malignant neoplasm of breast: Secondary | ICD-10-CM | POA: Diagnosis not present

## 2020-06-25 DIAGNOSIS — K921 Melena: Secondary | ICD-10-CM | POA: Insufficient documentation

## 2020-06-25 DIAGNOSIS — E785 Hyperlipidemia, unspecified: Secondary | ICD-10-CM | POA: Diagnosis not present

## 2020-06-25 LAB — CBC
HCT: 33.7 % — ABNORMAL LOW (ref 36.0–46.0)
Hemoglobin: 11.2 g/dL — ABNORMAL LOW (ref 12.0–15.0)
MCH: 29.5 pg (ref 26.0–34.0)
MCHC: 33.2 g/dL (ref 30.0–36.0)
MCV: 88.7 fL (ref 80.0–100.0)
Platelets: 204 10*3/uL (ref 150–400)
RBC: 3.8 MIL/uL — ABNORMAL LOW (ref 3.87–5.11)
RDW: 14 % (ref 11.5–15.5)
WBC: 4.7 10*3/uL (ref 4.0–10.5)
nRBC: 0 % (ref 0.0–0.2)

## 2020-06-25 LAB — FOLATE: Folate: 38 ng/mL (ref >5.9)

## 2020-06-25 LAB — IRON AND TIBC
Iron: 90 ug/dL (ref 28–170)
Saturation Ratios: 32 % — ABNORMAL HIGH (ref 10.4–31.8)
TIBC: 281 ug/dL (ref 250–450)
UIBC: 191 ug/dL

## 2020-06-25 LAB — DAT, POLYSPECIFIC AHG (ARMC ONLY): Polyspecific AHG test: NEGATIVE

## 2020-06-25 LAB — VITAMIN B12: Vitamin B-12: 1193 pg/mL — ABNORMAL HIGH (ref 180–914)

## 2020-06-25 LAB — FERRITIN: Ferritin: 270 ng/mL (ref 11–307)

## 2020-06-25 LAB — LACTATE DEHYDROGENASE: LDH: 151 U/L (ref 98–192)

## 2020-06-25 NOTE — Progress Notes (Signed)
Subjective:  Patient ID: Carrie Kane, female    DOB: [birth year],  MRN: [id number]  Chief Complaint  Patient presents with  . Foot Ulcer    "I think its doing better"    79 y.o. female presents for wound care.  Patient presents with left submetatarsal 2 ulceration limited to the breakdown of the skin.  Patient known to Dr. Prudence Davidson who follows up with her for routine foot care.  She just wants to get it evaluated to make sure that this not getting worse.  She states she has been taking doxycycline has finished the course.  She denies any other acute complaints she has been applying Neosporin and a Band-Aid to it.  She was referred to me by Dr. Prudence Davidson for further wound care..  Patient is a diabetic with last A1c of 8.4.   Review of Systems: Negative except as noted in the HPI. Denies N/V/F/Ch.  Past Medical History:  Diagnosis Date  . CAD (coronary artery disease)    a. 04/2017 Cath: LM 80, LAD 80p, 42m, LCX 95ost, 4m, EF 45-50%; b. 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  . Diastolic dysfunction    a. 04/2017 Echo: EF 55-60%, no rwma, Gr1 DD, mildly dil LA.  Marland Kitchen Hyperlipidemia   . Hypertensive heart disease   . Insulin dependent diabetes mellitus     Current Outpatient Medications:  .  aspirin EC 81 MG tablet, Take 1 tablet (81 mg total) by mouth daily., Disp: 90 tablet, Rfl: 3 .  atorvastatin (LIPITOR) 40 MG tablet, Take 1 tablet (40 mg total) by mouth daily at 6 PM., Disp: 90 tablet, Rfl: 1 .  BD INSULIN SYRINGE U/F 30G X 1/2" 0.5 ML MISC, , Disp: , Rfl:  .  benazepril-hydrochlorthiazide (LOTENSIN HCT) 20-12.5 MG tablet, Take 1 tablet by mouth 2 (two) times daily., Disp: 180 tablet, Rfl: 2 .  Chlorpheniramine Maleate (ALLERGY PO), Take 1 tablet by mouth daily as needed (allergies)., Disp: , Rfl:  .  cholecalciferol (VITAMIN D) 1000 units tablet, Take 2,000 Units by mouth daily at 12 noon. , Disp: , Rfl:  .  cloNIDine (CATAPRES) 0.2 MG tablet, Take 1 tablet (0.2 mg total) by mouth  2 (two) times daily., Disp: 90 tablet, Rfl: 1 .  clopidogrel (PLAVIX) 75 MG tablet, TAKE 1 TABLET DAILY, Disp: 90 tablet, Rfl: 0 .  doxycycline (VIBRA-TABS) 100 MG tablet, Take 1 tablet (100 mg total) by mouth 2 (two) times daily., Disp: 20 tablet, Rfl: 0 .  ferrous sulfate 325 (65 FE) MG EC tablet, Take 325 mg by mouth 2 (two) times a day., Disp: , Rfl:  .  HUMALOG KWIKPEN 100 UNIT/ML KwikPen, , Disp: , Rfl:  .  HUMALOG MIX 75/25 KWIKPEN (75-25) 100 UNIT/ML Kwikpen, , Disp: , Rfl:  .  insulin glargine (LANTUS) 100 UNIT/ML injection, Inject 45 Units into the skin at bedtime., Disp: , Rfl:  .  levothyroxine (SYNTHROID) 100 MCG tablet, Take 1 tablet (100 mcg total) by mouth daily before breakfast., Disp: 90 tablet, Rfl: 3 .  metoprolol tartrate (LOPRESSOR) 25 MG tablet, TAKE 1 TABLET TWICE A DAY (MUST KEEP SCHEDULED APPOINTMENT FOR ADDITIONAL REFILLS, THANK YOU), Disp: 180 tablet, Rfl: 2 .  Multiple Vitamin (MULTIVITAMIN) capsule, Take 1 capsule by mouth daily at 12 noon. , Disp: , Rfl:  .  nitroGLYCERIN (NITROSTAT) 0.4 MG SL tablet, Place 1 tablet (0.4 mg total) under the tongue every 5 (five) minutes as needed for chest pain., Disp: 30 tablet, Rfl: 12 .  Polyethyl Glycol-Propyl Glycol (SYSTANE OP), Apply 1 drop to eye as needed (dry eyes)., Disp: , Rfl:   Social History   Tobacco Use  Smoking Status Never Smoker  Smokeless Tobacco Never Used    Allergies  Allergen Reactions  . Flexeril [Cyclobenzaprine] Hypertension   Objective:  There were no vitals filed for this visit. There is no height or weight on file to calculate BMI. Constitutional Well developed. Well nourished.  Vascular Dorsalis pedis pulses palpable bilaterally. Posterior tibial pulses palpable bilaterally. Capillary refill normal to all digits.  No cyanosis or clubbing noted. Pedal hair growth normal.  Neurologic Normal speech. Oriented to person, place, and time. Protective sensation absent  Dermatologic Wound  Location: Left submetatarsal 2 ulceration limited to the breakdown of the skin Wound Base: Granular/Healthy Peri-wound: Normal Exudate: None: wound tissue dry Wound Measurements: -See below  Orthopedic: No pain to palpation either foot.   Radiographs: None Assessment:   1. Ulcer of left foot, limited to breakdown of skin ([place])   2. Diabetes mellitus type 2, insulin dependent ([place])    Plan:  Patient was evaluated and treated and all questions answered.  Ulcer left submetatarsal 2 limited to the breakdown of the skin -Debridement as below. -Dressed with Betadine wet-to-dry, DSD. -Continue off-loading with surgical shoe.  Procedure: Excisional Debridement of Wound Tool: Sharp chisel blade/tissue nipper Rationale: Removal of non-viable soft tissue from the wound to promote healing.  Anesthesia: none Pre-Debridement Wound Measurements: 0.5 cm x 0.2 cm x 0.1 cm  Post-Debridement Wound Measurements: 0.6 cm x 0.2 cm x 2.1 cm  Type of Debridement: Sharp Excisional Tissue Removed: Non-viable soft tissue Blood loss: Minimal (<50cc) Depth of Debridement: subcutaneous tissue. Technique: Sharp excisional debridement to bleeding, viable wound base.  Wound Progress: This is my initial evaluation.  I will continue monitor the progression and healing of it. Dressing: Dry, sterile, compression dressing. Disposition: Patient tolerated procedure well. Patient to return in 1 week for follow-up.  No follow-ups on file.

## 2020-06-26 LAB — HAPTOGLOBIN: Haptoglobin: 94 mg/dL (ref 42–346)

## 2020-06-26 LAB — CEA: CEA: 1.9 ng/mL (ref 0.0–4.7)

## 2020-06-27 ENCOUNTER — Telehealth: Payer: Self-pay | Admitting: *Deleted

## 2020-06-27 NOTE — Telephone Encounter (Signed)
Patient notified of lab results and change to f/u appointments. Per Dr. Grayland Ormond labs are within normal limits and patient does not require Feraheme at this time. Spoke with patient about cancelling appointment for [facility] and switching clinic visit to MyChart video visit-patient is unable to do this type of visit. Message sent to Dr. Grayland Ormond to see how we should proceed with MD f/u at this time.

## 2020-07-02 ENCOUNTER — Inpatient Hospital Stay: Payer: Medicare Other

## 2020-07-02 ENCOUNTER — Inpatient Hospital Stay: Payer: Medicare Other | Admitting: Oncology

## 2020-07-08 ENCOUNTER — Encounter: Payer: Self-pay | Admitting: Podiatry

## 2020-07-08 ENCOUNTER — Ambulatory Visit: Payer: Medicare Other | Admitting: Podiatry

## 2020-07-08 ENCOUNTER — Other Ambulatory Visit: Payer: Self-pay

## 2020-07-08 DIAGNOSIS — Z794 Long term (current) use of insulin: Secondary | ICD-10-CM

## 2020-07-08 DIAGNOSIS — L97521 Non-pressure chronic ulcer of other part of left foot limited to breakdown of skin: Secondary | ICD-10-CM | POA: Diagnosis not present

## 2020-07-08 DIAGNOSIS — B351 Tinea unguium: Secondary | ICD-10-CM | POA: Diagnosis not present

## 2020-07-08 DIAGNOSIS — M79675 Pain in left toe(s): Secondary | ICD-10-CM

## 2020-07-08 DIAGNOSIS — E119 Type 2 diabetes mellitus without complications: Secondary | ICD-10-CM | POA: Diagnosis not present

## 2020-07-08 DIAGNOSIS — M79674 Pain in right toe(s): Secondary | ICD-10-CM

## 2020-07-09 ENCOUNTER — Encounter: Payer: Self-pay | Admitting: Podiatry

## 2020-07-09 NOTE — Progress Notes (Signed)
Subjective:  Patient ID: Carrie Kane, female    DOB: [birth year],  MRN: [id number]  Chief Complaint  Patient presents with  . Foot Ulcer    "I think its better.  I want him to clip my nails today"    79 y.o. female presents for wound care.  Patient presents with left submetatarsal 2 preulcerative lesion/ulceration.  Patient states is doing a lot better.  Appears to have healed up.  She has been doing regular dressing changes.  Patient is diabetic with last A1c of 8.4.  She also has secondary complaint of thickened elongated dystrophic toenails x10.  She has not been able to do it herself.  She would like to have them debrided down.  There is mild pain on palpation   Review of Systems: Negative except as noted in the HPI. Denies N/V/F/Ch.  Past Medical History:  Diagnosis Date  . CAD (coronary artery disease)    a. 04/2017 Cath: LM 80, LAD 80p, 52m, LCX 95ost, 58m, EF 45-50%; b. 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  . Diastolic dysfunction    a. 04/2017 Echo: EF 55-60%, no rwma, Gr1 DD, mildly dil LA.  Marland Kitchen Hyperlipidemia   . Hypertensive heart disease   . Insulin dependent diabetes mellitus     Current Outpatient Medications:  .  aspirin EC 81 MG tablet, Take 1 tablet (81 mg total) by mouth daily., Disp: 90 tablet, Rfl: 3 .  atorvastatin (LIPITOR) 40 MG tablet, Take 1 tablet (40 mg total) by mouth daily at 6 PM., Disp: 90 tablet, Rfl: 1 .  BD INSULIN SYRINGE U/F 30G X 1/2" 0.5 ML MISC, , Disp: , Rfl:  .  benazepril-hydrochlorthiazide (LOTENSIN HCT) 20-12.5 MG tablet, Take 1 tablet by mouth 2 (two) times daily., Disp: 180 tablet, Rfl: 2 .  Chlorpheniramine Maleate (ALLERGY PO), Take 1 tablet by mouth daily as needed (allergies)., Disp: , Rfl:  .  cholecalciferol (VITAMIN D) 1000 units tablet, Take 2,000 Units by mouth daily at 12 noon. , Disp: , Rfl:  .  cloNIDine (CATAPRES) 0.2 MG tablet, Take 1 tablet (0.2 mg total) by mouth 2 (two) times daily., Disp: 90 tablet, Rfl: 1 .   clopidogrel (PLAVIX) 75 MG tablet, TAKE 1 TABLET DAILY, Disp: 90 tablet, Rfl: 0 .  ferrous sulfate 325 (65 FE) MG EC tablet, Take 325 mg by mouth 2 (two) times a day., Disp: , Rfl:  .  HUMALOG KWIKPEN 100 UNIT/ML KwikPen, , Disp: , Rfl:  .  HUMALOG MIX 75/25 KWIKPEN (75-25) 100 UNIT/ML Kwikpen, , Disp: , Rfl:  .  insulin glargine (LANTUS) 100 UNIT/ML injection, Inject 45 Units into the skin at bedtime., Disp: , Rfl:  .  levothyroxine (SYNTHROID) 100 MCG tablet, Take 1 tablet (100 mcg total) by mouth daily before breakfast., Disp: 90 tablet, Rfl: 3 .  metoprolol tartrate (LOPRESSOR) 25 MG tablet, TAKE 1 TABLET TWICE A DAY (MUST KEEP SCHEDULED APPOINTMENT FOR ADDITIONAL REFILLS, THANK YOU), Disp: 180 tablet, Rfl: 2 .  Multiple Vitamin (MULTIVITAMIN) capsule, Take 1 capsule by mouth daily at 12 noon. , Disp: , Rfl:  .  nitroGLYCERIN (NITROSTAT) 0.4 MG SL tablet, Place 1 tablet (0.4 mg total) under the tongue every 5 (five) minutes as needed for chest pain., Disp: 30 tablet, Rfl: 12 .  Polyethyl Glycol-Propyl Glycol (SYSTANE OP), Apply 1 drop to eye as needed (dry eyes)., Disp: , Rfl:   Social History   Tobacco Use  Smoking Status Never Smoker  Smokeless Tobacco Never Used  Allergies  Allergen Reactions  . Flexeril [Cyclobenzaprine] Hypertension   Objective:  There were no vitals filed for this visit. There is no height or weight on file to calculate BMI. Constitutional Well developed. Well nourished.  Vascular Dorsalis pedis pulses palpable bilaterally. Posterior tibial pulses palpable bilaterally. Capillary refill normal to all digits.  No cyanosis or clubbing noted. Pedal hair growth normal.  Neurologic Normal speech. Oriented to person, place, and time. Protective sensation absent Nail Exam: Pt has thick disfigured discolored nails with subungual debris noted bilateral entire nail hallux through fifth toenails.  Pain on palpation to the nails.  Dermatologic Wound Location: Left  submetatarsal 2 ulceration limited to the breakdown of the skin Wound Base: Granular/Healthy Peri-wound: Normal Exudate: None: wound tissue dry Wound Measurements: -See below  Orthopedic: No pain to palpation either foot.   Radiographs: None Assessment:   1. Ulcer of left foot, limited to breakdown of skin ([place])   2. Diabetes mellitus type 2, insulin dependent ([facility])   3. Pain due to onychomycosis of toenails of both feet    Plan:  Patient was evaluated and treated and all questions answered.  Ulcer left submetatarsal 2 limited to the breakdown of the skin -Clinically healed.  The hyperkeratotic lesion was debrided down no further ulceration noted.  At this time patient can stop doing dressing changes with Betadine.  I discussed with the patient given that she has plantarflexed metatarsal I am worried that this will open up into an ulceration if not properly offloaded.  I will continue to monitor this preulcerative callus and if it opens up into an ulceration I will discuss surgical intervention to perform a floating osteotomy.  Patient agrees with the plan  Onychomycosis with pain  -Nails palliatively debrided as below. -Educated on self-care  Procedure: Nail Debridement Rationale: pain  Type of Debridement: manual, sharp debridement. Instrumentation: Nail nipper, rotary burr. Number of Nails: 10  Procedures and Treatment: Consent by patient was obtained for treatment procedures. The patient understood the discussion of treatment and procedures well. All questions were answered thoroughly reviewed. Debridement of mycotic and hypertrophic toenails, 1 through 5 bilateral and clearing of subungual debris. No ulceration, no infection noted.  Return Visit-Office Procedure: Patient instructed to return to the office for a follow up visit 3 months for continued evaluation and treatment.  Boneta Lucks, DPM    No follow-ups on file.   No follow-ups on file.

## 2020-07-15 ENCOUNTER — Other Ambulatory Visit: Payer: Self-pay | Admitting: *Deleted

## 2020-07-15 DIAGNOSIS — Z1239 Encounter for other screening for malignant neoplasm of breast: Secondary | ICD-10-CM

## 2020-07-23 ENCOUNTER — Ambulatory Visit: Payer: Medicare Other

## 2020-08-04 ENCOUNTER — Other Ambulatory Visit: Payer: Self-pay | Admitting: Cardiovascular Disease

## 2020-08-04 MED ORDER — METOPROLOL TARTRATE 25 MG PO TABS: ORAL_TABLET | ORAL | 1 refills | Status: DC

## 2020-08-04 NOTE — Telephone Encounter (Signed)
*  STAT* If patient is at the pharmacy, call can be transferred to refill team.   1. Which medications need to be refilled? (please list name of each medication and dose if known) metoprolol tartrate 25 mg  2. Which pharmacy/location (including street and city if local pharmacy) is medication to be sent to[facility]   3. Do they need a 30 day or 90 day supply? 90  Patient states she is out

## 2020-08-04 NOTE — Telephone Encounter (Signed)
Requested Prescriptions   Signed Prescriptions Disp Refills   metoprolol tartrate (LOPRESSOR) 25 MG tablet 180 tablet 1    Sig: TAKE 1 TABLET TWICE A DAY    Authorizing Provider: Minna Merritts    Ordering User: NEWCOMER MCCLAIN, Sheilia Reznick L

## 2020-08-05 ENCOUNTER — Other Ambulatory Visit: Payer: Self-pay | Admitting: Internal Medicine

## 2020-08-08 NOTE — Progress Notes (Signed)
  [facility]  [phone] [phone]) [phone]  ID: Carrie Kane OB: 1940/11/03  MR#: [id number]  LGX#:[id number]  Patient Care Team: Cletis Athens, MD as PCP - General (Internal Medicine) Minna Merritts, MD as PCP - Cardiology (Cardiology) Minna Merritts, MD as Consulting Physician (Cardiology)  Lloyd Huger, MD   08/13/2020 6:22 AM     This encounter was created in error - please disregard.

## 2020-08-11 ENCOUNTER — Ambulatory Visit: Payer: Medicare Other | Admitting: Physician Assistant

## 2020-08-11 ENCOUNTER — Inpatient Hospital Stay: Payer: Medicare Other

## 2020-08-11 ENCOUNTER — Encounter: Payer: Self-pay | Admitting: Physician Assistant

## 2020-08-11 ENCOUNTER — Other Ambulatory Visit: Payer: Self-pay

## 2020-08-11 VITALS — BP 168/80 | HR 57 | Ht 68.0 in | Wt 157.0 lb

## 2020-08-11 DIAGNOSIS — I25118 Atherosclerotic heart disease of native coronary artery with other forms of angina pectoris: Secondary | ICD-10-CM | POA: Diagnosis not present

## 2020-08-11 DIAGNOSIS — Z951 Presence of aortocoronary bypass graft: Secondary | ICD-10-CM

## 2020-08-11 DIAGNOSIS — I10 Essential (primary) hypertension: Secondary | ICD-10-CM | POA: Diagnosis not present

## 2020-08-11 DIAGNOSIS — I739 Peripheral vascular disease, unspecified: Secondary | ICD-10-CM

## 2020-08-11 DIAGNOSIS — R002 Palpitations: Secondary | ICD-10-CM

## 2020-08-11 DIAGNOSIS — I5032 Chronic diastolic (congestive) heart failure: Secondary | ICD-10-CM

## 2020-08-11 DIAGNOSIS — Z01810 Encounter for preprocedural cardiovascular examination: Secondary | ICD-10-CM

## 2020-08-11 DIAGNOSIS — I6523 Occlusion and stenosis of bilateral carotid arteries: Secondary | ICD-10-CM

## 2020-08-11 DIAGNOSIS — E785 Hyperlipidemia, unspecified: Secondary | ICD-10-CM

## 2020-08-11 DIAGNOSIS — D500 Iron deficiency anemia secondary to blood loss (chronic): Secondary | ICD-10-CM

## 2020-08-11 DIAGNOSIS — Z794 Long term (current) use of insulin: Secondary | ICD-10-CM

## 2020-08-11 DIAGNOSIS — R42 Dizziness and giddiness: Secondary | ICD-10-CM

## 2020-08-11 DIAGNOSIS — E119 Type 2 diabetes mellitus without complications: Secondary | ICD-10-CM

## 2020-08-11 DIAGNOSIS — Z8679 Personal history of other diseases of the circulatory system: Secondary | ICD-10-CM

## 2020-08-11 MED ORDER — CARVEDILOL 3.125 MG PO TABS: 3.1250 mg | ORAL_TABLET | Freq: Two times a day (BID) | ORAL | 2 refills | Status: DC

## 2020-08-11 MED ORDER — BENAZEPRIL HCL 40 MG PO TABS: 40.0000 mg | ORAL_TABLET | Freq: Every day | ORAL | 2 refills | Status: DC

## 2020-08-11 MED ORDER — HYDROCHLOROTHIAZIDE 12.5 MG PO CAPS
12.5000 mg | ORAL_CAPSULE | Freq: Every day | ORAL | 2 refills | Status: DC
Start: 2020-08-11 — End: 2021-04-30

## 2020-08-11 NOTE — Progress Notes (Signed)
Office Visit    Patient Name: Carrie Kane Date of Encounter: 08/11/2020  Primary Care Provider:  Cletis Athens, MD Primary Cardiologist:  Ida Rogue, MD  Chief Complaint    Chief Complaint  Patient presents with  . OTHER    Pre-op surgical clearance due to recent discovery of heart murmur. Getting ready to have cataract surgery. Medications verbally reviewed with patient.     79 yo female with history of CAD s/p CABG x 3 (LIMA to LAD, SVG to Diag, SVG to OM - 2018), carotid stenosis, PAD, HTN, HLD, IDDM, HFpEF, and here today for heart murmur heard on cardiac exam.   Past Medical History    Past Medical History:  Diagnosis Date  . CAD (coronary artery disease)    a. 04/2017 Cath: LM 80, LAD 80p, 1m LCX 95ost, 756mEF 45-50%; b. 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  . Diastolic dysfunction    a. 04/2017 Echo: EF 55-60%, no rwma, Gr1 DD, mildly dil LA.  . Marland Kitchenyperlipidemia   . Hypertensive heart disease   . Insulin dependent diabetes mellitus    Past Surgical History:  Procedure Laterality Date  . ABDOMINAL HYSTERECTOMY    . CORONARY ARTERY BYPASS GRAFT N/A 05/09/2017   Procedure: CORONARY ARTERY BYPASS GRAFTING (CABG) x 3 using left internal mammary artery and right greater saphenous vein harvested endoscopically;  Surgeon: VaIvin PootMD;  [facility] Service: Open [facility];  Laterality: N/A;  . INTRAOPERATIVE TRANSESOPHAGEAL ECHOCARDIOGRAM N/A 05/09/2017   Procedure: INTRAOPERATIVE TRANSESOPHAGEAL ECHOCARDIOGRAM;  Surgeon: VaIvin PootMD;  [facility] Service: [facility];  Laterality: N/A;  . LEFT HEART CATH AND CORONARY ANGIOGRAPHY N/A 05/02/2017   Procedure: Left Heart Cath and Coronary Angiography;  Surgeon: ArWellington HampshireMD;  [facility];  Service: Cardiovascular;  Laterality: N/A;    Allergies  Allergies  Allergen Reactions  . Flexeril [Cyclobenzaprine] Hypertension    History of Present  Illness    BeMicky Overturfs a [age].o. female with PMH as above. She was seen in clinic in 2018 with CP and abnormal ETT. She underwent diagnostic LHC 04/2017 that showed significant multivessel dz, including severe LM dz. She  underwent CABG x3 on 05/09/2017 (LIMA to LAD, SVG to Diag, SVG to OM).  She was last seen in office 12/25/19 by her primary cardiologist, Dr GoRockey SituAt that time, she reported that she was active taking care of the house. She had occasional palpitations, sometimes for 3-4 days in a row. She was taking metoprolol on a PRN basis.   Today, 08/11/2020, she returns to clinic and notes that she is doing relatively well from a cardiac standpoint.  BP is noticeably elevated at 168/80 with patient reporting that she did not take her metoprolol this morning.  On further investigation, she reports that she only takes her metoprolol when she knows that she will not be out driving or running errands and will spend the majority of the day at home.  She reports that she is constantly dizzy on her metoprolol and worried that she may fall.  This morning, she took all of her medications at 9 AM, except for her metoprolol.  She reports that she monitors her blood pressure at home with SBP 132-136 and DBP 72-74.  On review of EMR, it appears that she was on carvedilol 6.25 mg twice daily in the past, and today she reports that she is uncertain of the  reason this was discontinued for her with recommendations as below.  She denies any recent falls or syncope.  No chest pain, racing heart rate, palpitations, orthopnea, PND, or early satiety.  She otherwise reports medication compliance.  No signs or symptoms of bleeding.  She reports an upcoming cataract surgery for which she needs cardiology evaluation with recommendations as below.  She states that her cataract surgery was originally scheduled for November 8; however, she has to reschedule it due to having to make another appointment for her family.  Home  Medications    Current Outpatient Medications on File Prior to Visit  Medication Sig Dispense Refill  . aspirin EC 81 MG tablet Take 1 tablet (81 mg total) by mouth daily. 90 tablet 3  . atorvastatin (LIPITOR) 40 MG tablet Take 1 tablet (40 mg total) by mouth daily at 6 PM. 90 tablet 1  . BD INSULIN SYRINGE U/F 30G X 1/2" 0.5 ML MISC     . Chlorpheniramine Maleate (ALLERGY PO) Take 1 tablet by mouth daily as needed (allergies).    . cholecalciferol (VITAMIN D) 1000 units tablet Take 2,000 Units by mouth daily at 12 noon.     . cloNIDine (CATAPRES) 0.2 MG tablet Take 1 tablet (0.2 mg total) by mouth 2 (two) times daily. 90 tablet 1  . clopidogrel (PLAVIX) 75 MG tablet TAKE 1 TABLET DAILY 90 tablet 0  . ferrous sulfate 325 (65 FE) MG EC tablet Take 325 mg by mouth 2 (two) times a day.    Marland Kitchen HUMALOG KWIKPEN 100 UNIT/ML KwikPen     . HUMALOG MIX 75/25 KWIKPEN (75-25) 100 UNIT/ML Kwikpen     . LANTUS 100 UNIT/ML injection INJECT 45 UNITS UNDER THE SKIN AT BEDTIME 40 mL 3  . levothyroxine (SYNTHROID) 100 MCG tablet Take 1 tablet (100 mcg total) by mouth daily before breakfast. 90 tablet 3  . Multiple Vitamin (MULTIVITAMIN) capsule Take 1 capsule by mouth daily at 12 noon.     . nitroGLYCERIN (NITROSTAT) 0.4 MG SL tablet Place 1 tablet (0.4 mg total) under the tongue every 5 (five) minutes as needed for chest pain. 30 tablet 12  . Polyethyl Glycol-Propyl Glycol (SYSTANE OP) Apply 1 drop to eye as needed (dry eyes).     No current facility-administered medications on file prior to visit.    Review of Systems    She denies chest pain, palpitations, dyspnea, pnd, orthopnea, n, v, syncope, edema, weight gain, or early satiety.  She reports constant dizziness on metoprolol.   All other systems reviewed and are otherwise negative except as noted above.  Physical Exam    VS:  BP (!) 168/80 (BP Location: Left Arm, Patient Position: Sitting, Cuff Size: Normal)   Pulse (!) 57   Ht '5\' 8"'  (1.727 m)   Wt  157 lb (71.2 kg)   SpO2 98%   BMI 23.87 kg/m  , BMI Body mass index is 23.87 kg/m. GEN: Well nourished, well developed, in no acute distress. HEENT: normal. Neck: Supple, no JVD, carotid bruits, or masses. Cardiac: Bradycardic but regular, no murmurs, rubs, or gallops. No clubbing, cyanosis, edema.  Radials/DP/PT 2+ and equal bilaterally.  Respiratory:  Respirations regular and unlabored, clear to auscultation bilaterally. GI: Soft, nontender, nondistended, BS + x 4. MS: no deformity or atrophy. Skin: warm and dry, no rash. Neuro:  Strength and sensation are intact. Psych: Normal affect.  Accessory Clinical Findings    ECG personally reviewed by me today - SB, RBBB, LAFB, bifascicular  block, LVH, poor R wave progression - no acute changes.  VITALS Reviewed today   Temp Readings from Last 3 Encounters:  06/25/20 97.6 F (36.4 C) (Tympanic)  04/05/20 98.8 F (37.1 C) (Oral)  05/15/17 98.7 F (37.1 C) (Oral)   BP Readings from Last 3 Encounters:  08/11/20 (!) 168/80  06/25/20 (!) 211/78  06/17/20 (!) 137/51   Pulse Readings from Last 3 Encounters:  08/11/20 (!) 57  06/25/20 75  06/17/20 (!) 55    Wt Readings from Last 3 Encounters:  08/11/20 157 lb (71.2 kg)  06/25/20 158 lb 6.4 oz (71.8 kg)  06/17/20 159 lb 11.2 oz (72.4 kg)     LABS  reviewed today   Lab Results  Component Value Date   WBC 4.7 06/25/2020   HGB 11.2 (L) 06/25/2020   HCT 33.7 (L) 06/25/2020   MCV 88.7 06/25/2020   PLT 204 06/25/2020   Lab Results  Component Value Date   CREATININE 1.07 (H) 06/03/2017   BUN 21 06/03/2017   NA 138 06/03/2017   K 4.3 06/03/2017   CL 98 06/03/2017   CO2 23 06/03/2017   No results found for: ALT, AST, GGT, ALKPHOS, BILITOT Lab Results  Component Value Date   CHOL 155 05/03/2017   HDL 55 05/03/2017   LDLCALC 81 05/03/2017   TRIG 93 05/03/2017   CHOLHDL 2.8 05/03/2017    Lab Results  Component Value Date   HGBA1C 8.4 (H) 05/03/2017   Lab Results   Component Value Date   TSH 0.372 05/03/2017     STUDIES/PROCEDURES reviewed today   Echo TEE 05/09/2017 Left ventricle: Normal cavity size. Concentric hypertrophy of severe  severity. LV systolic function is low normal with an EF of 50-55%. There  are no obvious wall motion abnormalities.  No change post procedure, with normal LV function and no RWMA.   [place] Arms, legs, carotids 05/05/2017 - Bilateral - 1% to 39% ICA stenosis. Vertebral artey flow is  antegrade.  - Palmar arch evaluation - Doppler waveforms remained normal  bilaterally with both radial and ulnar compressions.  - ABIs on the right indicate a moderate reduction in arterial flow  at rest. The left ABI could not be ascertained due to non  compressible arteries possible secondary to calcification.    Echo 05/03/2017 - Left ventricle: The cavity size was normal. Systolic function was  normal. The estimated ejection fraction was in the range of 55%  to 60%. Wall motion was normal; there were no regional wall  motion abnormalities. Doppler parameters are consistent with  abnormal left ventricular relaxation (grade 1 diastolic  dysfunction).  - Mitral valve: Moderately calcified annulus.  - Left atrium: The atrium was mildly dilated.  - Pulmonary arteries: Systolic pressure was mildly increased.  LHC 05/02/2017  LM lesion, 80 %stenosed.  Ost Cx to Prox Cx lesion, 95 %stenosed.  Mid Cx lesion, 70 %stenosed.  Mid LAD lesion, 30 %stenosed.  Prox LAD lesion, 80 %stenosed.  There is mild left ventricular systolic dysfunction.  LV end diastolic pressure is moderately elevated.  The left ventricular ejection fraction is 45-50% by visual estimate. 1. Severe heavily calcified calcified left main stenosis extending into the ostium of the left circumflex with significant proximal LAD stenosis. Diffuse diabetic branch disease. 2. Mildly reduced LV systolic function with an EF of 45-50% with moderate mid  to distal anterior and apical hypokinesis. 3. Severely elevated systemic hypertension with moderately elevated left ventricular end-diastolic pressure. Recommendations: Given the patient's cath  findings, severely elevated blood pressure with chest pain, recommend hospital admission for blood pressure control and revascularization. Recommend CABG. I switched labetalol to carvedilol. The patient is not truly allergic to aspirin but she reports previous retinal bleeding on antiplatelet medications. I'm going to start small dose aspirin 81 mg once daily as I feel that the benefits outweigh the risks at this time.   Assessment & Plan    Preoperative evaluation  --She denies any active anginal symptoms.  She does report dizziness with bradycardic rate. Given this sx of dizziness with bradycardia, recommendation was to transition from metoprolol to carvedilol at this time and revisit sx with this trial of very low dose BB. No cardiac murmur appreciated on exam; however, she is referred for this reason, and given the time since her previous echo and sx of dizziness, reasonable to update at this time. --Clinical risk factors include history of ischemic heart disease, history of congestive heart failure, diabetes on insulin.   --Functional capacity approximately 4 METS.   --Surgery specific risk low and less than 1%.   --RCRI risk: Class IV, 11% risk of MACE.  --Update echo as above and before procedure. If without significant findings, proceed to surgery. --Continue ASA and Plavix, given her left main disease and severe 3v disease s/p CABG as above.  No signs or symptoms of bleeding. We discussed that she will not need to hold her Plavix in preparation for cataract surgery, which she states she was already informed of before this visit.  Dizziness/presyncope --Reports constant dizziness on metoprolol.  States this is not new for her and a chronic issue, as evidenced on review of EMR. We discussed that this  is not an ideal medication for her, given she is unlikely to take it given her symptoms.   --Discontinue lopressor. --Trial carvedilol 3.125 mg BID, as it appears this is worked for her in the past.   --Of note, Coreg is small dose and reduced from that of previous BB if utilizing equivalent dosing. In order to provide additional BP support, we will go up on her benazepril as outlined below.  We also discussed that HCTZ could be contributing to her dizziness; however, in order to avoid multiple medication changes at once, we will defer any changes to her HCTZ at this time.  Also discussed was carotid stenosis; however, will defer updated studies at this time as she does not have any other symptoms concerning for worsening disease.   --Reassess at RTC, and if she continues to have symptoms, consider avoiding BB and utilizing alternative antihypertensives in the future. Future considerations also include cardiac monitoring, given her history of paroxysmal SVT, as this could contribute to her dizziness.  PSVT reportedly resolved with metoprolol; however, she is no longer regularly taking this medication.  Future considerations also include updated bilateral carotids with most recent studies as above showing 1 to 39% stenosis.  Check CBC, given h/o dark stool on review of EMR, as well as history of anemia and current dizziness with report of murmur.  Check BMET to ensure electrolytes stable.  Again, hydration encouraged as well with total volume under 2L and salt under 2g.  CAD s/p CABG  --No concerning symptoms for angina.  Work-up for dizziness as above.  She has not needed her nitro.  We will update an echo as above.  Continue current medications.  Continue aggressive risk factor control, including cholesterol control for risk factor modification.  We will recheck her LDL/lipids/LFTs with further recommendations at  that time if needed.    Chronic HFpEF -Previous echo as above with EF 55 to 60%, mildly  increased PASP, mildly dilated left atrium, moderately calcified mitral valve, G1DD.  Recommend BP control 130/80 or lower.  No recent reported shortness of breath or dyspnea.  Euvolemic and well compensated on exam.  Continue current medications.  We will update echocardiogram as above.  As above, if ongoing dizziness and pending repeat labs, consider decreasing HCTZ for relief of dizziness if transition from metoprolol to low-dose carvedilol is not effective in supplying any relief.  Essential hypertension -BP suboptimal at 168/80 today.  She reports that, given her dizziness with metoprolol, she often does not take this medication.  Without her metoprolol, her current heart rate is 57 bpm.  We will transition her from metoprolol 25 mg twice daily to carvedilol 3.125 mg twice daily and increase her benazepril from 20 mg to 40 mg to compensate / help support her BP.  If she does not tolerate the trial of low-dose carvedilol, we will need to reassess her antihypertensive regimen at RTC.  Ideally, we will wean her off of clonidine in the future, as this is known to cause rebound hypertension.  Palpitations -She denies any significant palpitations today.  Previously prescribed metoprolol for her palpitations; however, she has not been taking this regularly, as it exacerbates her dizziness.  Recommend she transition from metoprolol to carvedilol 3.125 mg twice daily. She will let us know how she tolerates this medication.  Again, as above, check CBC and BMET.  Bilateral carotid disease --Previous study as above with mild bilateral disease.  Consider as contributing to dizziness.  At RTC, reassess updating studies if work-up as above unrevealing and ongoing report of dizziness at RTC.  Continue current ASA and Plavix.  Continue cholesterol control with statin with further recommendations regarding increase of statin pending repeat cholesterol labs.  Anemia -Consider as contributing to dizziness.  Reassess  with repeat CBC, as this could contribute to the echo auscultated on MD prior exam..  Further work-up, if indicated, per PCP  HLD, goal below 70 -Continue current atorvastatin 40 mg.  She follows with her PCP for updated lipids each year.  LDL goal below 70 recommended.  We will recheck lipid and liver function.  If not at LDL goal on recheck, recommend increase of atorvastatin from 40 mg to 80 mg daily.  IDDM -Continue to monitor glycemic levels for risk factor modification.  Per PCP.  Ongoing lifestyle and diet changes recommended.  Medication changes: Stop metoprolol.  Start carvedilol 3.125 mg twice daily and to be escalated as tolerated.  Increase current benazepril from 20 mg to 40 mg daily.  Given this is a combination medication, we will need to separate benazepril from HCTZ.  As below, future considerations with ongoing dizziness could include decrease of HCTZ.  Will defer for now. Labs ordered:, CBC, c-Met, lipid, LDL Studies / Imaging ordered: Echocardiogram Future considerations:  ?Statin increase,?  Additional antihypertensive,? Zio,?  Bilateral carotids,?  Decreased HCTZ Disposition: RTC 6 months or sooner if indicated   Arvil Chaco, PA-C 08/11/2020

## 2020-08-11 NOTE — Patient Instructions (Addendum)
Medication Instructions:  - Your physician has recommended you make the following change in your medication:   1) STOP metoprolol  2) START coreg (carvedilol) 3.125 mg- take 1 tablet by mouth twice daily - please monitor your blood pressure at home - if your blood pressure is running consistently > 130/80, please call the office   3) STOP benazepril-hctz  4) START benazepril 40 mg- take 1 tablet by mouth once daily  5) START hctz 12.5 mg- take 1 tablet by mouth once daily   *If you need a refill on your cardiac medications before your next appointment, please call your pharmacy*   Lab Work: - Your physician recommends that you have lab work today: BMP/ CBC/ Lipid/ Direct LDL  - Your physician recommends that you return for lab work in: 1 week (around 08/18/20)- BMP - Come to the [place] at [facility] - 1st desk on the right to check in past the screening table - Lab hours: Monday- Friday (7:30 am- 5:30 pm)  If you have labs (blood work) drawn today and your tests are completely normal, you will receive your results only by: Marland Kitchen MyChart Message (if you have MyChart) OR . A paper copy in the mail If you have any lab test that is abnormal or we need to change your treatment, we will call you to review the results.   Testing/Procedures: - Your physician has requested that you have an echocardiogram. Echocardiography is a painless test that uses sound waves to create images of your heart. It provides your doctor with information about the size and shape of your heart and how well your heart's chambers and valves are working. This procedure takes approximately one hour. There are no restrictions for this procedure. There is a possibility that an IV may need to be started during your test to inject an image enhancing agent. This is done to obtain more optimal pictures of your heart. Therefore we ask that you do at least drink some water prior to coming in to hydrate your veins.      Follow-Up: At [facility], you and your health needs are our priority.  As part of our continuing mission to provide you with exceptional heart care, we have created designated Provider Care Teams.  These Care Teams include your primary Cardiologist (physician) and Advanced Practice Providers (APPs -  Physician Assistants and Nurse Practitioners) who all work together to provide you with the care you need, when you need it.  We recommend signing up for the patient portal called "MyChart".  Sign up information is provided on this After Visit Summary.  MyChart is used to connect with patients for Virtual Visits (Telemedicine).  Patients are able to view lab/test results, encounter notes, upcoming appointments, etc.  Non-urgent messages can be sent to your provider as well.   To learn more about what you can do with MyChart, go to [email or web address].    Your next appointment:   6 month(s)  The format for your next appointment:   In Person  Provider:   You may see Ida Rogue, MD or one of the following Advanced Practice Providers on your designated Care Team:    Murray Hodgkins, NP  Christell Faith, PA-C  Marrianne Mood, PA-C  Cadence Kathlen Mody, [place]    Other Instructions   Echocardiogram An echocardiogram is a procedure that uses painless sound waves (ultrasound) to produce an image of the heart. Images from an echocardiogram can provide important information about:  Signs of coronary artery  disease (CAD).  Aneurysm detection. An aneurysm is a weak or damaged part of an artery wall that bulges out from the normal force of blood pumping through the body.  Heart size and shape. Changes in the size or shape of the heart can be associated with certain conditions, including heart failure, aneurysm, and CAD.  Heart muscle function.  Heart valve function.  Signs of a past heart attack.  Fluid buildup around the heart.  Thickening of the heart muscle.  A tumor or  infectious growth around the heart valves. Tell a health care provider about:  Any allergies you have.  All medicines you are taking, including vitamins, herbs, eye drops, creams, and over-the-counter medicines.  Any blood disorders you have.  Any surgeries you have had.  Any medical conditions you have.  Whether you are pregnant or may be pregnant. What are the risks? Generally, this is a safe procedure. However, problems may occur, including:  Allergic reaction to dye (contrast) that may be used during the procedure. What happens before the procedure? No specific preparation is needed. You may eat and drink normally. What happens during the procedure?   An IV tube may be inserted into one of your veins.  You may receive contrast through this tube. A contrast is an injection that improves the quality of the pictures from your heart.  A gel will be applied to your chest.  A wand-like tool (transducer) will be moved over your chest. The gel will help to transmit the sound waves from the transducer.  The sound waves will harmlessly bounce off of your heart to allow the heart images to be captured in real-time motion. The images will be recorded on a computer. The procedure may vary among health care providers and hospitals. What happens after the procedure?  You may return to your normal, everyday life, including diet, activities, and medicines, unless your health care provider tells you not to do that. Summary  An echocardiogram is a procedure that uses painless sound waves (ultrasound) to produce an image of the heart.  Images from an echocardiogram can provide important information about the size and shape of your heart, heart muscle function, heart valve function, and fluid buildup around your heart.  You do not need to do anything to prepare before this procedure. You may eat and drink normally.  After the echocardiogram is completed, you may return to your normal,  everyday life, unless your health care provider tells you not to do that. This information is not intended to replace advice given to you by your health care provider. Make sure you discuss any questions you have with your health care provider. Document Revised: 01/25/2019 Document Reviewed: 11/06/2016 Elsevier Patient Education  [place].

## 2020-08-12 ENCOUNTER — Inpatient Hospital Stay: Payer: Medicare Other | Admitting: Oncology

## 2020-08-12 ENCOUNTER — Inpatient Hospital Stay: Payer: Medicare Other

## 2020-08-12 DIAGNOSIS — D500 Iron deficiency anemia secondary to blood loss (chronic): Secondary | ICD-10-CM

## 2020-08-12 LAB — LIPID PANEL
Chol/HDL Ratio: 1.9 ratio (ref 0.0–4.4)
Cholesterol, Total: 132 mg/dL (ref 100–199)
HDL: 68 mg/dL (ref >39)
LDL Chol Calc (NIH): 49 mg/dL (ref 0–99)
Triglycerides: 79 mg/dL (ref 0–149)
VLDL Cholesterol Cal: 15 mg/dL (ref 5–40)

## 2020-08-12 LAB — CBC WITH DIFFERENTIAL/PLATELET
Basophils Absolute: 0 10*3/uL (ref 0.0–0.2)
Basos: 1 %
EOS (ABSOLUTE): 0.2 10*3/uL (ref 0.0–0.4)
Eos: 3 %
Hematocrit: 37.3 % (ref 34.0–46.6)
Hemoglobin: 12 g/dL (ref 11.1–15.9)
Immature Grans (Abs): 0 10*3/uL (ref 0.0–0.1)
Immature Granulocytes: 0 %
Lymphocytes Absolute: 2 10*3/uL (ref 0.7–3.1)
Lymphs: 36 %
MCH: 29.9 pg (ref 26.6–33.0)
MCHC: 32.2 g/dL (ref 31.5–35.7)
MCV: 93 fL (ref 79–97)
Monocytes Absolute: 0.3 10*3/uL (ref 0.1–0.9)
Monocytes: 5 %
Neutrophils Absolute: 3 10*3/uL (ref 1.4–7.0)
Neutrophils: 55 %
Platelets: 227 10*3/uL (ref 150–450)
RBC: 4.02 x10E6/uL (ref 3.77–5.28)
RDW: 13.1 % (ref 11.7–15.4)
WBC: 5.5 10*3/uL (ref 3.4–10.8)

## 2020-08-12 LAB — BASIC METABOLIC PANEL
BUN/Creatinine Ratio: 20 (ref 12–28)
BUN: 17 mg/dL (ref 8–27)
CO2: 26 mmol/L (ref 20–29)
Calcium: 9.6 mg/dL (ref 8.7–10.3)
Chloride: 101 mmol/L (ref 96–106)
Creatinine, Ser: 0.87 mg/dL (ref 0.57–1.00)
GFR calc Af Amer: 74 mL/min/{1.73_m2} (ref >59)
GFR calc non Af Amer: 64 mL/min/{1.73_m2} (ref >59)
Glucose: 261 mg/dL — ABNORMAL HIGH (ref 65–99)
Potassium: 4.7 mmol/L (ref 3.5–5.2)
Sodium: 141 mmol/L (ref 134–144)

## 2020-08-12 LAB — LDL CHOLESTEROL, DIRECT: LDL Direct: 52 mg/dL (ref 0–99)

## 2020-08-19 ENCOUNTER — Other Ambulatory Visit
Admission: RE | Admit: 2020-08-19 | Discharge: 2020-08-19 | Disposition: A | Discharge status: Home / Self Care | Payer: Medicare Other | Source: Ambulatory Visit | Attending: Physician Assistant | Admitting: Physician Assistant

## 2020-08-19 DIAGNOSIS — I25118 Atherosclerotic heart disease of native coronary artery with other forms of angina pectoris: Secondary | ICD-10-CM | POA: Diagnosis present

## 2020-08-19 DIAGNOSIS — I10 Essential (primary) hypertension: Secondary | ICD-10-CM | POA: Insufficient documentation

## 2020-08-19 DIAGNOSIS — I5032 Chronic diastolic (congestive) heart failure: Secondary | ICD-10-CM | POA: Diagnosis present

## 2020-08-19 LAB — BASIC METABOLIC PANEL
Anion gap: 13 (ref 5–15)
BUN: 17 mg/dL (ref 8–23)
CO2: 26 mmol/L (ref 22–32)
Calcium: 9.5 mg/dL (ref 8.9–10.3)
Chloride: 97 mmol/L — ABNORMAL LOW (ref 98–111)
Creatinine, Ser: 0.79 mg/dL (ref 0.44–1.00)
GFR, Estimated: >60 mL/min (ref >60)
Glucose, Bld: 282 mg/dL — ABNORMAL HIGH (ref 70–99)
Potassium: 4.2 mmol/L (ref 3.5–5.1)
Sodium: 136 mmol/L (ref 135–145)

## 2020-08-21 ENCOUNTER — Other Ambulatory Visit: Admission: RE | Admit: 2020-08-21 | Payer: Medicare Other | Source: Ambulatory Visit

## 2020-08-21 ENCOUNTER — Telehealth: Payer: Self-pay | Admitting: *Deleted

## 2020-08-21 NOTE — Telephone Encounter (Signed)
-----   Message from Arvil Chaco, PA-C sent at 08/19/2020  4:45 PM EDT ----- Kermit Balo news! Renal function stable and electrolytes at goal with increase of benazepril.

## 2020-08-21 NOTE — Telephone Encounter (Signed)
No answer. Left detailed message with results, ok per DPR, and to call back if any questions.  

## 2020-08-25 ENCOUNTER — Encounter: Admission: RE | Payer: Self-pay | Source: Home / Self Care

## 2020-08-25 ENCOUNTER — Ambulatory Visit: Admission: RE | Admit: 2020-08-25 | Payer: Medicare Other | Source: Home / Self Care | Admitting: Ophthalmology

## 2020-08-25 SURGERY — PHACOEMULSIFICATION, CATARACT, WITH IOL INSERTION
Anesthesia: Topical | Laterality: Right

## 2020-08-28 ENCOUNTER — Other Ambulatory Visit: Payer: Self-pay

## 2020-08-28 ENCOUNTER — Ambulatory Visit (INDEPENDENT_AMBULATORY_CARE_PROVIDER_SITE_OTHER): Payer: Medicare Other

## 2020-08-28 DIAGNOSIS — I5032 Chronic diastolic (congestive) heart failure: Secondary | ICD-10-CM

## 2020-08-28 DIAGNOSIS — I25118 Atherosclerotic heart disease of native coronary artery with other forms of angina pectoris: Secondary | ICD-10-CM | POA: Diagnosis not present

## 2020-08-28 LAB — ECHOCARDIOGRAM COMPLETE
Area-P 1/2: 1.74 cm2
S' Lateral: 2.4 cm

## 2020-09-09 ENCOUNTER — Other Ambulatory Visit: Payer: Self-pay | Admitting: *Deleted

## 2020-09-09 ENCOUNTER — Ambulatory Visit: Payer: Medicare Other

## 2020-09-09 MED ORDER — CLONIDINE HCL 0.2 MG PO TABS: 0.2000 mg | ORAL_TABLET | Freq: Two times a day (BID) | ORAL | 3 refills | Status: DC

## 2020-09-16 ENCOUNTER — Encounter: Payer: Self-pay | Admitting: Gastroenterology

## 2020-09-16 ENCOUNTER — Ambulatory Visit: Payer: Medicare Other | Admitting: Gastroenterology

## 2020-09-16 ENCOUNTER — Other Ambulatory Visit: Payer: Self-pay

## 2020-09-16 ENCOUNTER — Telehealth: Payer: Self-pay | Admitting: Cardiovascular Disease

## 2020-09-16 VITALS — BP 186/75 | HR 62 | Ht 68.0 in | Wt 160.2 lb

## 2020-09-16 DIAGNOSIS — E611 Iron deficiency: Secondary | ICD-10-CM

## 2020-09-16 DIAGNOSIS — K921 Melena: Secondary | ICD-10-CM | POA: Diagnosis not present

## 2020-09-16 MED ORDER — NA SULFATE-K SULFATE-MG SULF 17.5-3.13-1.6 GM/177ML PO SOLN: 1.0000 | Freq: Once | ORAL | 0 refills | Status: AC

## 2020-09-16 NOTE — Progress Notes (Signed)
Gastroenterology Consultation  Referring Provider:     Cletis Athens, MD Primary Care Physician:  Cletis Athens, MD Primary Gastroenterologist:  Dr. Allen Norris     Reason for Consultation:     Iron deficient anemia        HPI:   Carrie Kane is a 79 y.o. y/o female referred for consultation & management of iron deficiency anemia by Dr. Cletis Athens, MD.  This patient comes in today with a history of seeing hematology for iron deficiency anemia.  The patient was also reported to have heme positive stools.  It appears that the patient's last colonoscopy was in 2008 and that was done by me. The patient reports that she has had some weight loss but she states it is because she is watching her diet because of her diabetes.  The patient denies any bloody stools but does state that her stools have been dark.  There is no report of any early satiety nausea vomiting fevers or chills.  The patient has also denied any abdominal pain.  The patient was seen by hematology for iron deficiency anemia also.  Past Medical History:  Diagnosis Date  . CAD (coronary artery disease)    a. 04/2017 Cath: LM 80, LAD 80p, 12m, LCX 95ost, 55m, EF 45-50%; b. 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  . Diastolic dysfunction    a. 04/2017 Echo: EF 55-60%, no rwma, Gr1 DD, mildly dil LA.  Marland Kitchen Hyperlipidemia   . Hypertensive heart disease   . Insulin dependent diabetes mellitus     Past Surgical History:  Procedure Laterality Date  . ABDOMINAL HYSTERECTOMY    . CORONARY ARTERY BYPASS GRAFT N/A 05/09/2017   Procedure: CORONARY ARTERY BYPASS GRAFTING (CABG) x 3 using left internal mammary artery and right greater saphenous vein harvested endoscopically;  Surgeon: Ivin Poot, MD;  Location: [facility];  Service: Open Heart Surgery;  Laterality: N/A;  . INTRAOPERATIVE TRANSESOPHAGEAL ECHOCARDIOGRAM N/A 05/09/2017   Procedure: INTRAOPERATIVE TRANSESOPHAGEAL ECHOCARDIOGRAM;  Surgeon: Ivin Poot, MD;  Location: [facility];  Service: Open Heart Surgery;  Laterality: N/A;  . LEFT HEART CATH AND CORONARY ANGIOGRAPHY N/A 05/02/2017   Procedure: Left Heart Cath and Coronary Angiography;  Surgeon: Wellington Hampshire, MD;  Location: [facility];  Service: Cardiovascular;  Laterality: N/A;    Prior to Admission medications   Medication Sig Start Date End Date Taking? Authorizing Provider  aspirin EC 81 MG tablet Take 1 tablet (81 mg total) by mouth daily. 08/02/17   Minna Merritts, MD  atorvastatin (LIPITOR) 40 MG tablet Take 1 tablet (40 mg total) by mouth daily at 6 PM. 06/03/17   Theora Gianotti, NP  BD INSULIN SYRINGE U/F 30G X 1/2" 0.5 ML MISC  03/29/20   [provider]  benazepril (LOTENSIN) 40 MG tablet Take 1 tablet (40 mg total) by mouth daily. 08/11/20   Marrianne Mood D, PA-C  carvedilol (COREG) 3.125 MG tablet Take 1 tablet (3.125 mg total) by mouth 2 (two) times daily with a meal. 08/11/20   Marrianne Mood D, PA-C  Chlorpheniramine Maleate (ALLERGY PO) Take 1 tablet by mouth daily as needed (allergies).    [provider]  cholecalciferol (VITAMIN D) 1000 units tablet Take 2,000 Units by mouth daily at 12 noon.     [provider]  cloNIDine (CATAPRES) 0.2 MG tablet Take 1 tablet (0.2 mg total) by mouth 2 (two) times daily. 09/09/20   Cletis Athens, MD  clopidogrel (  PLAVIX) 75 MG tablet TAKE 1 TABLET DAILY 09/25/19   Minna Merritts, MD  ferrous sulfate 325 (65 FE) MG EC tablet Take 325 mg by mouth 2 (two) times a day.    [provider]  Cleda Clarks 100 UNIT/ML KwikPen  02/20/20   [provider]  HUMALOG MIX 75/25 KWIKPEN (75-25) 100 UNIT/ML Claiborne Rigg  01/01/20   [provider]  hydrochlorothiazide (MICROZIDE) 12.5 MG capsule Take 1 capsule (12.5 mg total) by mouth daily. 08/11/20 11/09/20  Marrianne Mood D, PA-C  LANTUS 100 UNIT/ML injection INJECT 45 UNITS UNDER THE SKIN AT BEDTIME 08/05/20   Cletis Athens, MD    levothyroxine (SYNTHROID) 100 MCG tablet Take 1 tablet (100 mcg total) by mouth daily before breakfast. 04/10/20   Cletis Athens, MD  Multiple Vitamin (MULTIVITAMIN) capsule Take 1 capsule by mouth daily at 12 noon.     [provider]  nitroGLYCERIN (NITROSTAT) 0.4 MG SL tablet Place 1 tablet (0.4 mg total) under the tongue every 5 (five) minutes as needed for chest pain. 06/03/17   Theora Gianotti, NP  Polyethyl Glycol-Propyl Glycol (SYSTANE OP) Apply 1 drop to eye as needed (dry eyes).    [provider]    Family History  Problem Relation Age of Onset  . Breast cancer Maternal Aunt      Social History   Tobacco Use  . Smoking status: Never Smoker  . Smokeless tobacco: Never Used  Substance Use Topics  . Alcohol use: No  . Drug use: No    Allergies as of 09/16/2020 - Review Complete 08/11/2020  Allergen Reaction Noted  . Flexeril [cyclobenzaprine] Hypertension 09/20/2013    Review of Systems:    All systems reviewed and negative except where noted in HPI.   Physical Exam:  There were no vitals taken for this visit. No LMP recorded. Patient has had a hysterectomy. General:   Alert,  Well-developed, well-nourished, pleasant and cooperative in NAD Head:  Normocephalic and atraumatic. Eyes:  Sclera clear, no icterus.   Conjunctiva pink. Ears:  Normal auditory acuity. Neck:  Supple; no masses or thyromegaly. Lungs:  Respirations even and unlabored.  Clear throughout to auscultation.   No wheezes, crackles, or rhonchi. No acute distress. Heart:  Regular rate and rhythm; no murmurs, clicks, rubs, or gallops. Abdomen:  Normal bowel sounds.  No bruits.  Soft, non-tender and non-distended without masses, hepatosplenomegaly or hernias noted.  No guarding or rebound tenderness.  Negative Carnett sign.   Rectal:  Deferred.  Pulses:  Normal pulses noted. Extremities:  No clubbing or edema.  No cyanosis. Neurologic:  Alert and oriented x3;  grossly normal  neurologically. Skin:  Intact without significant lesions or rashes.  No jaundice. Lymph Nodes:  No significant cervical adenopathy. Psych:  Alert and cooperative. Normal mood and affect.  Imaging Studies: ECHOCARDIOGRAM COMPLETE  Result Date: 08/28/2020    ECHOCARDIOGRAM REPORT   Patient Name:   Carrie Kane Date of Exam: 08/28/2020 Medical Rec #:  [id number]      Height:       68.0 in Accession #:    [id number]     Weight:       157.0 lb Date of Birth:  Jan 09, 1941      BSA:          1.844 m Patient Age:    78 years       BP:           168/80 mmHg Patient Gender: F  HR:           43 bpm. Exam Location:  [facility] Procedure: 2D Echo, Cardiac Doppler and Color Doppler Indications:    I25.110 Atherosclerotic heart disease of native coronary artery                 with unstable angina pectoris; B35.32 Chronic diastolic                 (congestive) heart failure  History:        Patient has prior history of Echocardiogram examinations, most                 recent 05/03/2017. CHF, CAD, Prior CABG, PAD, Arrythmias:Atrial                 Fibrillation and RBBB, Signs/Symptoms:Murmur; Risk                 Factors:Hypertension, Diabetes and Dyslipidemia.  Sonographer:    Geradine Girt Referring Phys: [id number] [place]  1. Left ventricular ejection fraction, by estimation, is 55 to 60%. The left ventricle has normal function. The left ventricle has no regional wall motion abnormalities. Left ventricular diastolic parameters are consistent with Grade II diastolic dysfunction (pseudonormalization).  2. Right ventricular systolic function is normal. The right ventricular size is mildly enlarged. There is normal pulmonary artery systolic pressure. The estimated right ventricular systolic pressure is 34.1 mmHg.  3. Left atrial size was moderately dilated.  4. Right atrial size was mildly dilated.  5. The mitral valve is normal in structure. No evidence of mitral valve regurgitation. No  evidence of mitral stenosis. Moderate mitral annular calcification.  6. Tricuspid valve regurgitation is moderate. FINDINGS  Left Ventricle: Left ventricular ejection fraction, by estimation, is 55 to 60%. The left ventricle has normal function. The left ventricle has no regional wall motion abnormalities. The left ventricular internal cavity size was normal in size. There is  borderline left ventricular hypertrophy. Left ventricular diastolic parameters are consistent with Grade II diastolic dysfunction (pseudonormalization). Right Ventricle: The right ventricular size is mildly enlarged. No increase in right ventricular wall thickness. Right ventricular systolic function is normal. There is normal pulmonary artery systolic pressure. The tricuspid regurgitant velocity is 2.50  m/s, and with an assumed right atrial pressure of 5 mmHg, the estimated right ventricular systolic pressure is 96.2 mmHg. Left Atrium: Left atrial size was moderately dilated. Right Atrium: Right atrial size was mildly dilated. Pericardium: There is no evidence of pericardial effusion. Mitral Valve: The mitral valve is normal in structure. There is mild calcification of the mitral valve leaflet(s). Moderate mitral annular calcification. No evidence of mitral valve regurgitation. No evidence of mitral valve stenosis. MV peak gradient, 4.5 mmHg. The mean mitral valve gradient is 1.0 mmHg. Tricuspid Valve: The tricuspid valve is normal in structure. Tricuspid valve regurgitation is moderate . No evidence of tricuspid stenosis. Aortic Valve: The aortic valve was not well visualized. Aortic valve regurgitation is not visualized. No aortic stenosis is present. Pulmonic Valve: The pulmonic valve was normal in structure. Pulmonic valve regurgitation is not visualized. No evidence of pulmonic stenosis. Aorta: The aortic root is normal in size and structure. Venous: The inferior vena cava is normal in size with greater than 50% respiratory variability,  suggesting right atrial pressure of 3 mmHg. IAS/Shunts: No atrial level shunt detected by color flow Doppler.  LEFT VENTRICLE PLAX 2D LVIDd:         3.70 cm  Diastology LVIDs:  2.40 cm  LV e' medial:    3.26 cm/s LV PW:         0.90 cm  LV E/e' medial:  32.2 LV IVS:        1.40 cm  LV e' lateral:   8.05 cm/s LVOT diam:     2.00 cm  LV E/e' lateral: 13.0 LV SV:         87 LV SV Index:   47 LVOT Area:     3.14 cm  RIGHT VENTRICLE RV S prime:     6.20 cm/s LEFT ATRIUM             Index       RIGHT ATRIUM           Index LA diam:        5.15 cm 2.79 cm/m  RA Area:     14.10 cm LA Vol (A2C):   66.1 ml 35.85 ml/m RA Volume:   27.00 ml  14.64 ml/m LA Vol (A4C):   64.9 ml 35.20 ml/m LA Biplane Vol: 73.9 ml 40.08 ml/m  AORTIC VALVE LVOT Vmax:   94.00 cm/s LVOT Vmean:  70.500 cm/s LVOT VTI:    0.276 m  AORTA Ao Root diam: 3.10 cm Ao Asc diam:  3.60 cm MITRAL VALVE                TRICUSPID VALVE MV Area (PHT): 1.74 cm     TR Peak grad:   25.0 mmHg MV Peak grad:  4.5 mmHg     TR Vmax:        250.00 cm/s MV Mean grad:  1.0 mmHg MV Vmax:       1.06 m/s     SHUNTS MV Vmean:      53.5 cm/s    Systemic VTI:  0.28 m MV Decel Time: 437 msec     Systemic Diam: 2.00 cm MV E velocity: 105.00 cm/s MV A velocity: 88.30 cm/s MV E/A ratio:  1.19 Ida Rogue MD Electronically signed by Ida Rogue MD Signature Date/Time: 08/28/2020/7:06:53 PM    Final     Assessment and Plan:   Carrie Kane is a 79 y.o. y/o female who comes in today with a history of iron deficiency anemia with heme positive stools and has been seen by hematology for the iron deficiency anemia.  The patient has not had a colonoscopy since 2008.  She denies any other GI symptoms except for dark stools which she reports to be dark green more than dark black.  The patient will be set up for an EGD and colonoscopy to look for the source of her anemia.  The patient has been explained the plan and agrees with it.    Lucilla Lame, MD. Marval Regal    Note:  This dictation was prepared with Dragon dictation along with smaller phrase technology. Any transcriptional errors that result from this process are unintentional.

## 2020-09-16 NOTE — Telephone Encounter (Signed)
   [facility] Pre-operative Risk Assessment    HEARTCARE STAFF: - Please ensure there is not already an duplicate clearance open for this procedure. - Under Visit Info/Reason for Call, type in Other and utilize the format Clearance MM/DD/YY or Clearance TBD. Do not use dashes or single digits. - If request is for dental extraction, please clarify the # of teeth to be extracted.  Request for surgical clearance:  1. What type of surgery is being performed? Colonoscopy  2. When is this surgery scheduled? 10/02/20  3. What type of clearance is required (medical clearance vs. Pharmacy clearance to hold med vs. Both)? pharm  4. Are there any medications that need to be held prior to surgery and how long? plavix  5. Practice name and name of physician performing surgery? [facility]  6. What is the office phone number? [phone]   7.   What is the office fax number? [phone]  8.   Anesthesia type (None, local, MAC, general) ? Not noted   Carrie Kane 09/16/2020, 4:33 PM  _________________________________________________________________   (provider comments below)

## 2020-09-17 NOTE — Telephone Encounter (Signed)
Dr. 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 Recent echo for new heart murmur showed normal EF, no WMA mentioned. Can you comment on the request to hold plavix for upcoming colonoscopy?

## 2020-09-17 NOTE — Telephone Encounter (Signed)
Okay to hold Plavix 5 days for procedure Would stay on low-dose aspirin 81 mg daily

## 2020-09-17 NOTE — Telephone Encounter (Signed)
   Primary Cardiologist: Ida Rogue, MD  Chart reviewed as part of pre-operative protocol coverage. We have been asked for guidance to hold antiplatelets. Per Dr. Rockey Situ, may hold plavix for 5 days, but should not interrupt ASA therapy.   I will route this recommendation to the requesting party via Epic fax function and remove from pre-op pool. Please call with questions.  Tami Lin Kaiyon Hynes, PA 09/17/2020, 3:21 PM

## 2020-09-22 ENCOUNTER — Other Ambulatory Visit: Payer: Self-pay

## 2020-09-22 DIAGNOSIS — K921 Melena: Secondary | ICD-10-CM

## 2020-09-22 DIAGNOSIS — E611 Iron deficiency: Secondary | ICD-10-CM

## 2020-10-02 ENCOUNTER — Encounter: Payer: Self-pay | Admitting: Podiatry

## 2020-10-02 ENCOUNTER — Other Ambulatory Visit: Payer: Self-pay

## 2020-10-02 ENCOUNTER — Ambulatory Visit: Payer: Medicare Other | Admitting: Podiatry

## 2020-10-02 DIAGNOSIS — Z794 Long term (current) use of insulin: Secondary | ICD-10-CM

## 2020-10-02 DIAGNOSIS — L97525 Non-pressure chronic ulcer of other part of left foot with muscle involvement without evidence of necrosis: Secondary | ICD-10-CM | POA: Diagnosis not present

## 2020-10-02 DIAGNOSIS — E119 Type 2 diabetes mellitus without complications: Secondary | ICD-10-CM

## 2020-10-03 ENCOUNTER — Encounter: Payer: Self-pay | Admitting: Podiatry

## 2020-10-03 NOTE — Progress Notes (Signed)
Subjective:  Patient ID: Carrie Kane, female    DOB: 1941-03-16,  MRN: [id number]  Chief Complaint  Patient presents with   Nail Problem   Callouses   Diabetes    Nail and callous trim, [facility]    79 y.o. female presents for wound care.  Patient presents with complaint of left submetatarsal 4 ulceration that is probing down to deep tissue.  Patient is a diabetic with last A1c of 8.4.  She states it has progressively gotten worse she is worried that she may end up losing the toe.  She denies any other acute complaints there is dryness to which she states that she has been doing dressing changes right Neosporin Emma dressing.  She denies any other acute complaints.  She has not seen anyone since seeing me last time when there was no ulceration.  She has had a bad time controlling the sugar.   Review of Systems: Negative except as noted in the HPI. Denies N/V/F/Ch.  Past Medical History:  Diagnosis Date   CAD (coronary artery disease)    a. 04/2017 Cath: LM 84, LAD 80p, 29m, LCX 95ost, 42m, EF 45-50%; b. 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   Diastolic dysfunction    a. 04/2017 Echo: EF 55-60%, no rwma, Gr1 DD, mildly dil LA.   Hyperlipidemia    Hypertensive heart disease    Insulin dependent diabetes mellitus     Current Outpatient Medications:    aspirin EC 81 MG tablet, Take 1 tablet (81 mg total) by mouth daily., Disp: 90 tablet, Rfl: 3   atorvastatin (LIPITOR) 40 MG tablet, Take 1 tablet (40 mg total) by mouth daily at 6 PM., Disp: 90 tablet, Rfl: 1   BD INSULIN SYRINGE U/F 30G X 1/2" 0.5 ML MISC, , Disp: , Rfl:    benazepril (LOTENSIN) 40 MG tablet, Take 1 tablet (40 mg total) by mouth daily., Disp: 90 tablet, Rfl: 2   carvedilol (COREG) 3.125 MG tablet, Take 1 tablet (3.125 mg total) by mouth 2 (two) times daily with a meal., Disp: 180 tablet, Rfl: 2   Chlorpheniramine Maleate (ALLERGY PO), Take 1 tablet by mouth daily as needed (allergies)., Disp: , Rfl:     cholecalciferol (VITAMIN D) 1000 units tablet, Take 2,000 Units by mouth daily at 12 noon. , Disp: , Rfl:    cloNIDine (CATAPRES) 0.2 MG tablet, Take 1 tablet (0.2 mg total) by mouth 2 (two) times daily., Disp: 90 tablet, Rfl: 3   clopidogrel (PLAVIX) 75 MG tablet, TAKE 1 TABLET DAILY, Disp: 90 tablet, Rfl: 0   ferrous sulfate 325 (65 FE) MG EC tablet, Take 325 mg by mouth 2 (two) times a day., Disp: , Rfl:    HUMALOG KWIKPEN 100 UNIT/ML KwikPen, , Disp: , Rfl:    HUMALOG MIX 75/25 KWIKPEN (75-25) 100 UNIT/ML Kwikpen, , Disp: , Rfl:    hydrochlorothiazide (MICROZIDE) 12.5 MG capsule, Take 1 capsule (12.5 mg total) by mouth daily., Disp: 90 capsule, Rfl: 2   LANTUS 100 UNIT/ML injection, INJECT 45 UNITS UNDER THE SKIN AT BEDTIME, Disp: 40 mL, Rfl: 3   levothyroxine (SYNTHROID) 100 MCG tablet, Take 1 tablet (100 mcg total) by mouth daily before breakfast., Disp: 90 tablet, Rfl: 3   Multiple Vitamin (MULTIVITAMIN) capsule, Take 1 capsule by mouth daily at 12 noon. , Disp: , Rfl:    nitroGLYCERIN (NITROSTAT) 0.4 MG SL tablet, Place 1 tablet (0.4 mg total) under the tongue every 5 (five) minutes as needed for chest pain.,  Disp: 30 tablet, Rfl: 12   Polyethyl Glycol-Propyl Glycol (SYSTANE OP), Apply 1 drop to eye as needed (dry eyes)., Disp: , Rfl:   Social History   Tobacco Use  Smoking Status Never Smoker  Smokeless Tobacco Never Used    Allergies  Allergen Reactions   Flexeril [Cyclobenzaprine] Hypertension   Objective:  There were no vitals filed for this visit. There is no height or weight on file to calculate BMI. Constitutional Well developed. Well nourished.  Vascular Dorsalis pedis pulses faintly palpable bilaterally. Posterior tibial pulses  Faintly palpable bilaterally. Capillary refill normal to all digits.  No cyanosis or clubbing noted. Pedal hair growth normal.  Neurologic Normal speech. Oriented to person, place, and time. Protective sensation absent   Dermatologic Wound Location: Left submetatarsal 4 ulceration with fat layer exposed probes down to deep tissue with involvement of the muscle Wound Base: Mixed Granular/Fibrotic Peri-wound: Calloused Exudate: Scant/small amount Serosanguinous exudate Wound Measurements: -See below  Orthopedic: No pain to palpation either foot.   Radiographs: None Assessment:   1. Diabetes mellitus type 2, insulin dependent ([place])   2. Ulcer of left foot with muscle involvement without evidence of necrosis [facility])    Plan:  Patient was evaluated and treated and all questions answered.  Ulcer left submetatarsal 4 ulceration with involvement of the muscle and deep tissue -Debridement as below. -Dressed with Betadine wet-to-dry, DSD. -Continue off-loading with surgical shoe. -Patient is a high risk of losing the fourth digit versus a metatarsal if this continues to progress and gets worse.  I discussed this with patient extended detail patient may benefit from offloading osteotomy as well.  I will discuss this further during next visit.  I will also plan on doing vascular work-up prior to surgical intervention.  Procedure: Excisional Debridement of Wound Tool: Sharp chisel blade/tissue nipper Rationale: Removal of non-viable soft tissue from the wound to promote healing.  Anesthesia: none Pre-Debridement Wound Measurements: 3 cm x 2 cm x 1 cm  Post-Debridement Wound Measurements: 3.2 cm x 2.1 cm x 1 cm  Type of Debridement: Sharp Excisional Tissue Removed: Non-viable soft tissue Blood loss: Minimal (<50cc) Depth of Debridement: subcutaneous tissue. Technique: Sharp excisional debridement to bleeding, viable wound base.  Wound Progress: This is my initial evaluation of the wound we will continue to monitor the progression of it Site healing conversation 7 Dressing: Dry, sterile, compression dressing. Disposition: Patient tolerated procedure well. Patient to return in 1 week for follow-up.  No  follow-ups on file.

## 2020-10-06 ENCOUNTER — Other Ambulatory Visit: Payer: Self-pay | Admitting: Internal Medicine

## 2020-10-07 ENCOUNTER — Telehealth: Payer: Self-pay

## 2020-10-07 ENCOUNTER — Ambulatory Visit: Payer: Medicare Other | Admitting: Podiatry

## 2020-10-07 NOTE — Telephone Encounter (Signed)
Returned patients call in regards to her colonoscopy not being covered by insurance.  Asked her to call me back to discuss.  Thanks,  [place], [place]

## 2020-10-15 ENCOUNTER — Telehealth: Payer: Self-pay

## 2020-10-15 NOTE — Telephone Encounter (Signed)
Patient has requested to cancel her colonoscopy at this time due to her daughter, and husband's health at this time.  She will call back to reschedule at a later time.  Thanks,  [place], [place]

## 2020-10-21 ENCOUNTER — Encounter: Payer: Self-pay | Admitting: Podiatry

## 2020-10-21 ENCOUNTER — Ambulatory Visit: Payer: Medicare Other | Admitting: Podiatry

## 2020-10-21 ENCOUNTER — Other Ambulatory Visit: Payer: Self-pay

## 2020-10-21 DIAGNOSIS — E119 Type 2 diabetes mellitus without complications: Secondary | ICD-10-CM

## 2020-10-21 DIAGNOSIS — L97522 Non-pressure chronic ulcer of other part of left foot with fat layer exposed: Secondary | ICD-10-CM | POA: Diagnosis not present

## 2020-10-21 DIAGNOSIS — Z794 Long term (current) use of insulin: Secondary | ICD-10-CM

## 2020-10-21 DIAGNOSIS — I999 Unspecified disorder of circulatory system: Secondary | ICD-10-CM

## 2020-10-21 NOTE — Progress Notes (Signed)
Subjective:  Patient ID: Carrie Kane, female    DOB: 05-01-1941,  MRN: [id number]  Chief Complaint  Patient presents with  . Wound Check    "Its looking better"    80 y.o. female presents for wound care.  Patient presents with complaint of left submetatarsal 4 ulceration that is probing down to deep tissue.  Patient is a diabetic with last A1c of 8.4.  She states it has progressively gotten worse she is worried that she may end up losing the toe.  She denies any other acute complaints there is dryness to which she states that she has been doing dressing changes right Neosporin Emma dressing.  She denies any other acute complaints.  She has not seen anyone since seeing me last time when there was no ulceration.  She has had a bad time controlling the sugar.   Review of Systems: Negative except as noted in the HPI. Denies N/V/F/Ch.  Past Medical History:  Diagnosis Date  . CAD (coronary artery disease)    a. 04/2017 Cath: LM 80, LAD 80p, 64m, LCX 95ost, 6m, EF 45-50%; b. 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  . Diastolic dysfunction    a. 04/2017 Echo: EF 55-60%, no rwma, Gr1 DD, mildly dil LA.  Marland Kitchen Hyperlipidemia   . Hypertensive heart disease   . Insulin dependent diabetes mellitus     Current Outpatient Medications:  .  aspirin EC 81 MG tablet, Take 1 tablet (81 mg total) by mouth daily., Disp: 90 tablet, Rfl: 3 .  atorvastatin (LIPITOR) 40 MG tablet, TAKE 1 TABLET DAILY, Disp: 90 tablet, Rfl: 3 .  BD INSULIN SYRINGE U/F 30G X 1/2" 0.5 ML MISC, , Disp: , Rfl:  .  benazepril (LOTENSIN) 40 MG tablet, Take 1 tablet (40 mg total) by mouth daily., Disp: 90 tablet, Rfl: 2 .  carvedilol (COREG) 3.125 MG tablet, Take 1 tablet (3.125 mg total) by mouth 2 (two) times daily with a meal., Disp: 180 tablet, Rfl: 2 .  Chlorpheniramine Maleate (ALLERGY PO), Take 1 tablet by mouth daily as needed (allergies)., Disp: , Rfl:  .  cholecalciferol (VITAMIN D) 1000 units tablet, Take 2,000 Units  by mouth daily at 12 noon. , Disp: , Rfl:  .  cloNIDine (CATAPRES) 0.2 MG tablet, Take 1 tablet (0.2 mg total) by mouth 2 (two) times daily., Disp: 90 tablet, Rfl: 3 .  clopidogrel (PLAVIX) 75 MG tablet, TAKE 1 TABLET DAILY, Disp: 90 tablet, Rfl: 0 .  ferrous sulfate 325 (65 FE) MG EC tablet, Take 325 mg by mouth 2 (two) times a day., Disp: , Rfl:  .  HUMALOG KWIKPEN 100 UNIT/ML KwikPen, , Disp: , Rfl:  .  HUMALOG MIX 75/25 KWIKPEN (75-25) 100 UNIT/ML Kwikpen, , Disp: , Rfl:  .  hydrochlorothiazide (MICROZIDE) 12.5 MG capsule, Take 1 capsule (12.5 mg total) by mouth daily., Disp: 90 capsule, Rfl: 2 .  LANTUS 100 UNIT/ML injection, INJECT 45 UNITS UNDER THE SKIN AT BEDTIME, Disp: 40 mL, Rfl: 3 .  levothyroxine (SYNTHROID) 100 MCG tablet, Take 1 tablet (100 mcg total) by mouth daily before breakfast., Disp: 90 tablet, Rfl: 3 .  Multiple Vitamin (MULTIVITAMIN) capsule, Take 1 capsule by mouth daily at 12 noon. , Disp: , Rfl:  .  nitroGLYCERIN (NITROSTAT) 0.4 MG SL tablet, Place 1 tablet (0.4 mg total) under the tongue every 5 (five) minutes as needed for chest pain., Disp: 30 tablet, Rfl: 12 .  Polyethyl Glycol-Propyl Glycol (SYSTANE OP), Apply 1 drop to  eye as needed (dry eyes)., Disp: , Rfl:   Social History   Tobacco Use  Smoking Status Never Smoker  Smokeless Tobacco Never Used    Allergies  Allergen Reactions  . Flexeril [Cyclobenzaprine] Hypertension   Objective:  There were no vitals filed for this visit. There is no height or weight on file to calculate BMI. Constitutional Well developed. Well nourished.  Vascular Dorsalis pedis pulses faintly palpable bilaterally. Posterior tibial pulses  Faintly palpable bilaterally. Capillary refill normal to all digits.  No cyanosis or clubbing noted. Pedal hair growth normal.  Neurologic Normal speech. Oriented to person, place, and time. Protective sensation absent  Dermatologic Wound Location: Left submetatarsal 4 ulceration with fat  layer exposed and no longer probes down to deep tissue. Wound Base: Mixed Granular/Fibrotic Peri-wound: Calloused Exudate: Scant/small amount Serosanguinous exudate Wound Measurements: -See below  Orthopedic: No pain to palpation either foot.   Radiographs: None Assessment:   1. Vascular abnormality   2. Diabetes mellitus type 2, insulin dependent (HCC)   3. Ulcer of left foot with fat layer exposed (HCC)    Plan:  Patient was evaluated and treated and all questions answered.  Ulcer left submetatarsal 4 ulceration with fat layer exposed -Debridement as below. -Dressed with Betadine wet-to-dry, DSD. -Continue off-loading with surgical shoe. -Patient is a high risk of losing the fourth digit versus a metatarsal if this continues to progress and gets worse.  I discussed this with patient extended detail patient may benefit from offloading osteotomy as well.  I will discuss this further during next visit.  I will also plan on doing vascular work-up prior to surgical intervention. -ABIs PVRs were ordered  Procedure: Excisional Debridement of Wound Tool: Sharp chisel blade/tissue nipper Rationale: Removal of non-viable soft tissue from the wound to promote healing.  Anesthesia: none Pre-Debridement Wound Measurements: 1 cm x 1 cm x 0.3 cm Post-Debridement Wound Measurements: 1.1 cm x 1.3 cm x 0.3 cm Type of Debridement: Sharp Excisional Tissue Removed: Non-viable soft tissue Blood loss: Minimal (<50cc) Depth of Debridement: subcutaneous tissue. Technique: Sharp excisional debridement to bleeding, viable wound base.  Wound Progress: The wound is decreasing especially in size of the depth. Dressing: Dry, sterile, compression dressing. Disposition: Patient tolerated procedure well. Patient to return in 1 week for follow-up.  No follow-ups on file.

## 2020-10-28 ENCOUNTER — Ambulatory Visit
Admission: RE | Admit: 2020-10-28 | Discharge: 2020-10-28 | Disposition: A | Discharge status: Home / Self Care | Payer: Medicare Other | Source: Ambulatory Visit | Attending: Internal Medicine | Admitting: Internal Medicine

## 2020-10-28 ENCOUNTER — Other Ambulatory Visit: Payer: Self-pay

## 2020-10-28 DIAGNOSIS — Z1239 Encounter for other screening for malignant neoplasm of breast: Secondary | ICD-10-CM

## 2020-10-28 DIAGNOSIS — Z1231 Encounter for screening mammogram for malignant neoplasm of breast: Secondary | ICD-10-CM | POA: Insufficient documentation

## 2020-10-28 IMAGING — MG DIGITAL SCREENING BILAT W/ TOMO W/ CAD
8 series · 8 of 24 positions shown · non-contrast
Comparison: Previous exam(s).

CLINICAL DATA: Screening.

EXAM:
DIGITAL SCREENING BILATERAL MAMMOGRAM WITH TOMO AND CAD

[L CC synth-2D]
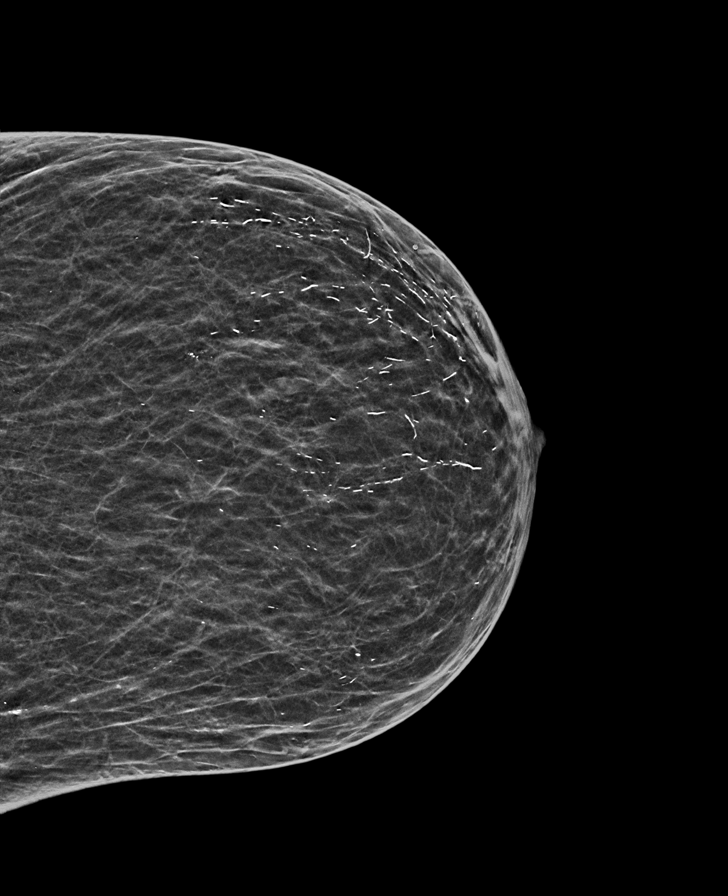

[R CC synth-2D]
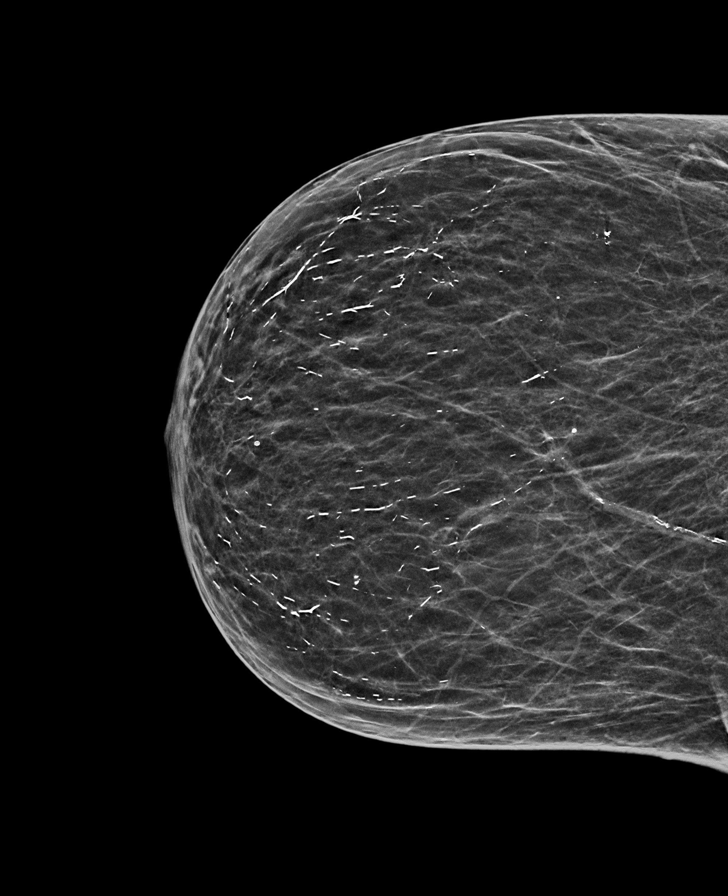

[L MLO synth-2D]
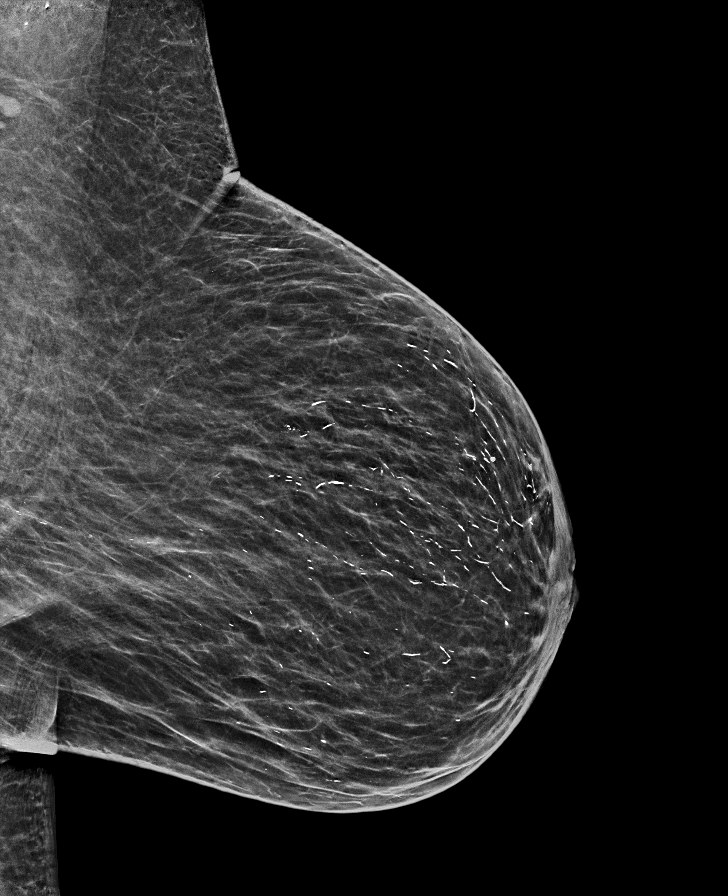

[R MLO synth-2D]
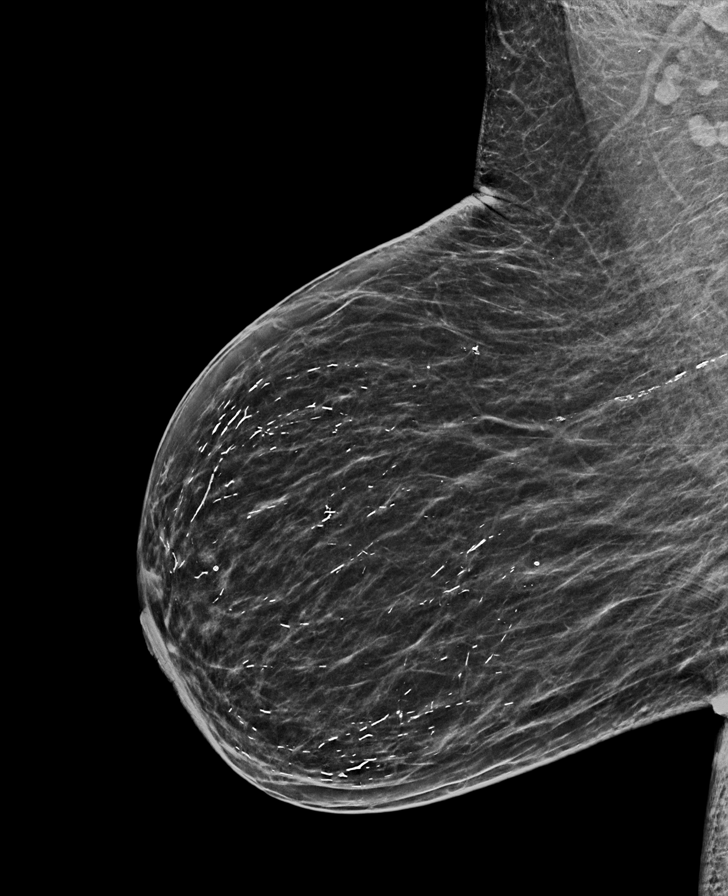

[L CC tomo · tomo slice 23/45.0]
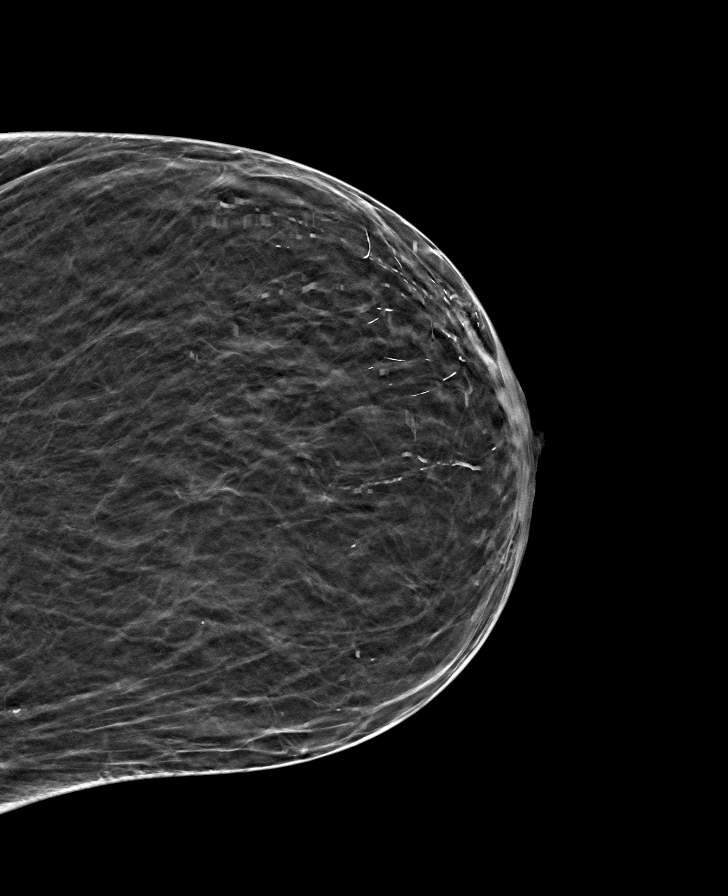

[L MLO tomo · tomo slice 29/56.0]
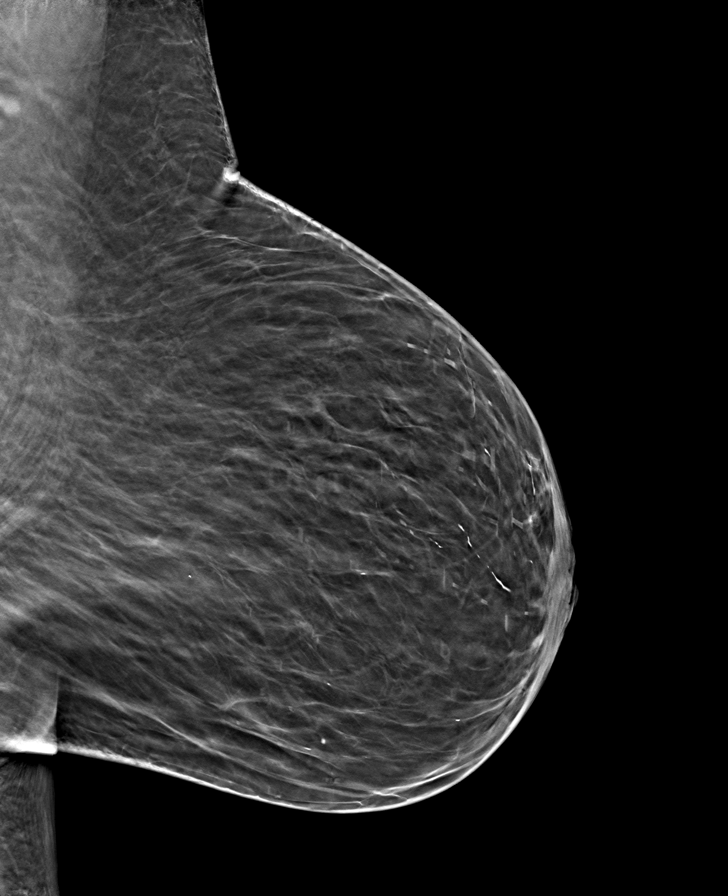

[R CC tomo · tomo slice 23/46.0]
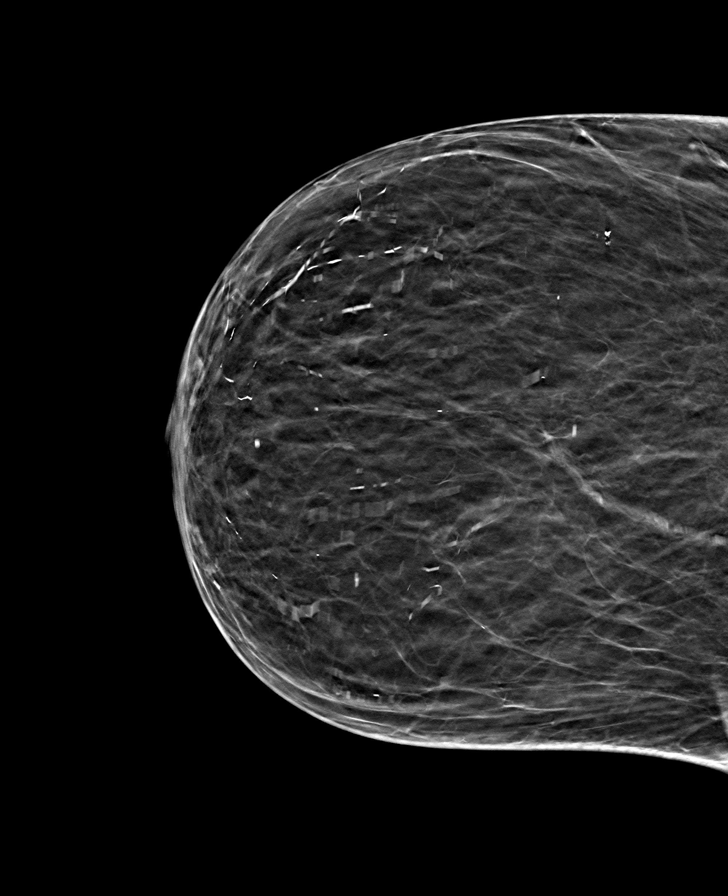

[R MLO tomo · tomo slice 29/58.0]
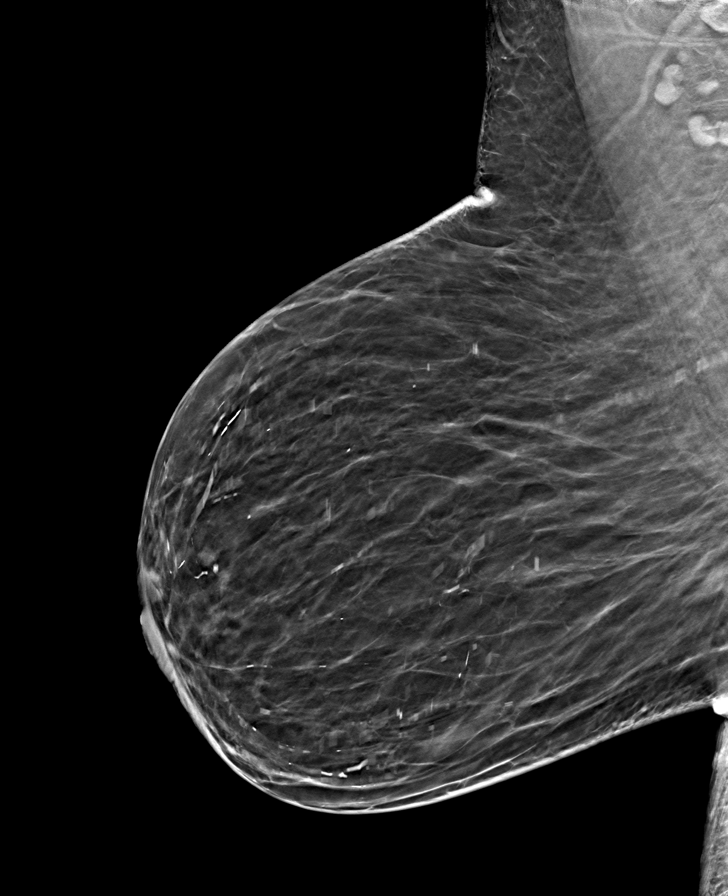

[8 of 24 positions shown; findings below may reference images not displayed]

ACR Breast Density Category b: There are scattered areas of
fibroglandular density.
FINDINGS: There are no findings suspicious for malignancy. Images were
processed with CAD.
IMPRESSION: No mammographic evidence of malignancy. A result letter of this
screening mammogram will be mailed directly to the patient.

RECOMMENDATION:
Screening mammogram in one year. (Code:[TQ])

BI-RADS CATEGORY  1: Negative.

## 2020-11-04 ENCOUNTER — Ambulatory Visit: Admit: 2020-11-04 | Payer: Medicare Other | Admitting: Gastroenterology

## 2020-11-04 SURGERY — COLONOSCOPY WITH PROPOFOL
Anesthesia: General

## 2020-11-06 ENCOUNTER — Other Ambulatory Visit: Payer: Self-pay | Admitting: Podiatry

## 2020-11-06 DIAGNOSIS — R0989 Other specified symptoms and signs involving the circulatory and respiratory systems: Secondary | ICD-10-CM

## 2020-11-18 ENCOUNTER — Other Ambulatory Visit: Payer: Medicare Other

## 2020-11-18 ENCOUNTER — Ambulatory Visit: Payer: Medicare Other

## 2020-11-18 ENCOUNTER — Ambulatory Visit: Payer: Medicare Other | Admitting: Oncology

## 2020-11-20 ENCOUNTER — Ambulatory Visit: Payer: Medicare Other | Admitting: Podiatry

## 2020-12-01 ENCOUNTER — Ambulatory Visit (INDEPENDENT_AMBULATORY_CARE_PROVIDER_SITE_OTHER): Payer: Medicare Other

## 2020-12-01 ENCOUNTER — Other Ambulatory Visit: Payer: Self-pay

## 2020-12-01 DIAGNOSIS — I999 Unspecified disorder of circulatory system: Secondary | ICD-10-CM | POA: Diagnosis not present

## 2020-12-01 DIAGNOSIS — R0989 Other specified symptoms and signs involving the circulatory and respiratory systems: Secondary | ICD-10-CM

## 2020-12-02 ENCOUNTER — Other Ambulatory Visit: Payer: Self-pay | Admitting: Podiatry

## 2020-12-02 DIAGNOSIS — I739 Peripheral vascular disease, unspecified: Secondary | ICD-10-CM

## 2020-12-04 ENCOUNTER — Ambulatory Visit
Admission: RE | Admit: 2020-12-04 | Discharge: 2020-12-04 | Disposition: A | Discharge status: Home / Self Care | Payer: Medicare Other | Source: Ambulatory Visit | Attending: Podiatry | Admitting: Podiatry

## 2020-12-04 ENCOUNTER — Encounter: Payer: Self-pay | Admitting: *Deleted

## 2020-12-04 DIAGNOSIS — I739 Peripheral vascular disease, unspecified: Secondary | ICD-10-CM

## 2020-12-04 HISTORY — PX: IR RADIOLOGIST EVAL & MGMT: IMG5224

## 2020-12-04 NOTE — Consult Note (Signed)
Chief Complaint: Patient was seen in consultation today for left foot wound at the request of Patel,Kevin P  Referring Physician(s): Patel,Kevin P  History of Present Illness: Carrie Kane is a 80 y.o. female with a long history of poorly controlled diabetes who presents at the kind request of Dr. Posey Pronto to assess for arterial insufficiency as a source for delayed healing of her left plantar submetatarsal wound.  Carrie Kane reports she is in her usual state of health.  She denies symptoms of claudication.  She denies shortness of breath, chest pains or other symptoms.   Past Medical History:  Diagnosis Date  . CAD (coronary artery disease)    a. 04/2017 Cath: LM 80, LAD 80p, 30m, LCX 95ost, 97m, EF 45-50%; b. 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  . Diastolic dysfunction    a. 04/2017 Echo: EF 55-60%, no rwma, Gr1 DD, mildly dil LA.  Marland Kitchen Hyperlipidemia   . Hypertensive heart disease   . Insulin dependent diabetes mellitus     Past Surgical History:  Procedure Laterality Date  . ABDOMINAL HYSTERECTOMY    . CORONARY ARTERY BYPASS GRAFT N/A 05/09/2017   Procedure: CORONARY ARTERY BYPASS GRAFTING (CABG) x 3 using left internal mammary artery and right greater saphenous vein harvested endoscopically;  Surgeon: Ivin Poot, MD;  Location: [facility];  Service: Open Heart Surgery;  Laterality: N/A;  . INTRAOPERATIVE TRANSESOPHAGEAL ECHOCARDIOGRAM N/A 05/09/2017   Procedure: INTRAOPERATIVE TRANSESOPHAGEAL ECHOCARDIOGRAM;  Surgeon: Ivin Poot, MD;  Location: [facility];  Service: Open Heart Surgery;  Laterality: N/A;  . IR RADIOLOGIST EVAL & MGMT  12/04/2020  . LEFT HEART CATH AND CORONARY ANGIOGRAPHY N/A 05/02/2017   Procedure: Left Heart Cath and Coronary Angiography;  Surgeon: Wellington Hampshire, MD;  Location: [facility];  Service: Cardiovascular;  Laterality: N/A;    Allergies: Flexeril [cyclobenzaprine]  Medications: Prior to Admission medications    Medication Sig Start Date End Date Taking? Authorizing Provider  aspirin EC 81 MG tablet Take 1 tablet (81 mg total) by mouth daily. 08/02/17   Minna Merritts, MD  atorvastatin (LIPITOR) 40 MG tablet TAKE 1 TABLET DAILY 10/06/20   Cletis Athens, MD  BD INSULIN SYRINGE U/F 30G X 1/2" 0.5 ML MISC  03/29/20   [provider]  benazepril (LOTENSIN) 40 MG tablet Take 1 tablet (40 mg total) by mouth daily. 08/11/20   Marrianne Mood D, PA-C  carvedilol (COREG) 3.125 MG tablet Take 1 tablet (3.125 mg total) by mouth 2 (two) times daily with a meal. 08/11/20   Marrianne Mood D, PA-C  Chlorpheniramine Maleate (ALLERGY PO) Take 1 tablet by mouth daily as needed (allergies).    [provider]  cholecalciferol (VITAMIN D) 1000 units tablet Take 2,000 Units by mouth daily at 12 noon.     [provider]  cloNIDine (CATAPRES) 0.2 MG tablet Take 1 tablet (0.2 mg total) by mouth 2 (two) times daily. 09/09/20   Cletis Athens, MD  clopidogrel (PLAVIX) 75 MG tablet TAKE 1 TABLET DAILY 09/25/19   Minna Merritts, MD  ferrous sulfate 325 (65 FE) MG EC tablet Take 325 mg by mouth 2 (two) times a day.    [provider]  Cleda Clarks 100 UNIT/ML KwikPen  02/20/20   [provider]  HUMALOG MIX 75/25 KWIKPEN (75-25) 100 UNIT/ML Claiborne Rigg  01/01/20   [provider]  hydrochlorothiazide (MICROZIDE) 12.5 MG capsule Take 1 capsule (12.5 mg total) by mouth daily. 08/11/20 11/09/20  Mickle Plumb, Jacquelyn D, PA-C  LANTUS 100 UNIT/ML injection INJECT 45 UNITS UNDER THE SKIN AT BEDTIME 08/05/20   Cletis Athens, MD  levothyroxine (SYNTHROID) 100 MCG tablet Take 1 tablet (100 mcg total) by mouth daily before breakfast. 04/10/20   Cletis Athens, MD  Multiple Vitamin (MULTIVITAMIN) capsule Take 1 capsule by mouth daily at 12 noon.     [provider]  nitroGLYCERIN (NITROSTAT) 0.4 MG SL tablet Place 1 tablet (0.4 mg total) under the tongue every 5 (five) minutes as  needed for chest pain. 06/03/17   Theora Gianotti, NP  Polyethyl Glycol-Propyl Glycol (SYSTANE OP) Apply 1 drop to eye as needed (dry eyes).    [provider]     Family History  Problem Relation Age of Onset  . Breast cancer Maternal Aunt     Social History   Socioeconomic History  . Marital status: Married    Spouse name: Not on file  . Number of children: Not on file  . Years of education: Not on file  . Highest education level: Not on file  Occupational History  . Not on file  Tobacco Use  . Smoking status: Never Smoker  . Smokeless tobacco: Never Used  Substance and Sexual Activity  . Alcohol use: No  . Drug use: No  . Sexual activity: Not on file  Other Topics Concern  . Not on file  Social History Narrative  . Not on file   Social Determinants of Health   Financial Resource Strain: Not on file  Food Insecurity: Not on file  Transportation Needs: Not on file  Physical Activity: Not on file  Stress: Not on file  Social Connections: Not on file    Review of Systems: A 12 point ROS discussed and pertinent positives are indicated in the HPI above.  All other systems are negative.  Review of Systems  Vital Signs: There were no vitals taken for this visit.  Physical Exam Constitutional:      General: She is not in acute distress.    Appearance: Normal appearance.  HENT:     Head: Normocephalic and atraumatic.  Eyes:     General: No scleral icterus. Cardiovascular:     Rate and Rhythm: Normal rate.     Pulses:          Dorsalis pedis pulses are detected w/ Doppler on the left side.       Posterior tibial pulses are detected w/ Doppler on the left side.  Pulmonary:     Effort: Pulmonary effort is normal.  Abdominal:     General: Abdomen is flat.  Musculoskeletal:        General: No swelling or tenderness.       Feet:  Skin:    General: Skin is warm and dry.  Neurological:     Mental Status: She is alert and oriented to person,  place, and time.  Psychiatric:        Mood and Affect: Mood normal.        Behavior: Behavior normal.      Imaging: VAS [place] ABI WITH/WO TBI  Result Date: 12/02/2020 LOWER EXTREMITY DOPPLER STUDY Indications: Peripheral artery disease. High Risk Factors: Hypertension, hyperlipidemia, Diabetes, no history of                    smoking, coronary artery disease. Other Factors: Physician noted decreased pulses, bilaterally. Patient denies any  claudication symptoms.  Vascular Interventions: CABG x 3. Performing Technologist: Wilkie Aye RVT  Examination Guidelines: A complete evaluation includes at minimum, Doppler waveform signals and systolic blood pressure reading at the level of bilateral brachial, anterior tibial, and posterior tibial arteries, when vessel segments are accessible. Bilateral testing is considered an integral part of a complete examination. Photoelectric Plethysmograph (PPG) waveforms and toe systolic pressure readings are included as required and additional duplex testing as needed. Limited examinations for reoccurring indications may be performed as noted.  ABI Findings: +---------+------------------+-----+-------------------+--------+ Right    Rt Pressure (mmHg)IndexWaveform           Comment  +---------+------------------+-----+-------------------+--------+ Brachial 213                                                +---------+------------------+-----+-------------------+--------+ ATA      167               0.78 dampened monophasic         +---------+------------------+-----+-------------------+--------+ PTA      148               0.69 dampened monophasic         +---------+------------------+-----+-------------------+--------+ PERO     156               0.73 dampened monophasic         +---------+------------------+-----+-------------------+--------+ Great Toe55                0.26 Abnormal                     +---------+------------------+-----+-------------------+--------+ +---------+------------------+-----+-------------------+-------+ Left     Lt Pressure (mmHg)IndexWaveform           Comment +---------+------------------+-----+-------------------+-------+ Brachial 211                                               +---------+------------------+-----+-------------------+-------+ ATA      161               0.76 dampened monophasic        +---------+------------------+-----+-------------------+-------+ PTA      130               0.61 dampened monophasic        +---------+------------------+-----+-------------------+-------+ PERO     177               0.83 dampened monophasic        +---------+------------------+-----+-------------------+-------+ Great Toe23                0.11 Abnormal                   +---------+------------------+-----+-------------------+-------+ +-------+-----------+-----------+------------+------------+ ABI/TBIToday's ABIToday's TBIPrevious ABIPrevious TBI +-------+-----------+-----------+------------+------------+ Right  0.78       026        0.71                     +-------+-----------+-----------+------------+------------+ Left   0.76       0.11       [place]                       +-------+-----------+-----------+------------+------------+  TOES Findings: +----------+--------+-------+ Right ToesWaveformComment +----------+--------+-------+ 1st Digit Abnormal        +----------+--------+-------+  2nd Digit Abnormal        +----------+--------+-------+ 3rd Digit Abnormal        +----------+--------+-------+ 4th Digit Abnormal        +----------+--------+-------+ 5th Digit Abnormal        +----------+--------+-------+  +---------+--------+-------+ Left ToesWaveformComment +---------+--------+-------+ 1st DigitAbnormal        +---------+--------+-------+ 2nd DigitAbnormal        +---------+--------+-------+ 3rd  DigitAbnormal        +---------+--------+-------+ 4th DigitAbnormal        +---------+--------+-------+ 5th DigitAbnormal        +---------+--------+-------+   Right ABIs appear essentially unchanged compared to prior study on 04/2017.  Summary: Right: Resting right ankle-brachial index indicates moderate right lower extremity arterial disease. The right toe-brachial index is abnormal. PPG tracings appear dampened. Left: Resting left ankle-brachial index indicates moderate left lower extremity arterial disease. The left toe-brachial index is abnormal. PPG tracings appear dampened.  *See table(s) above for measurements and observations.  Electronically signed by Ida Rogue MD on 12/02/2020 at 7:48:29 AM.    Final    VAS [place] LOWER EXTREMITY ARTERIAL DUPLEX  Result Date: 12/02/2020 LOWER EXTREMITY ARTERIAL DUPLEX STUDY Indications: Peripheral artery disease. High Risk Factors: Hypertension, hyperlipidemia, Diabetes, no history of                    smoking, coronary artery disease. Other Factors: Physician noted decreased pulses, bilaterally. Patient denies any                claudication symptoms.  Current ABI: Right ABI 0.78; Left ABI 0.76 Performing Technologist: Wilkie Aye RVT  Examination Guidelines: A complete evaluation includes B-mode imaging, spectral Doppler, color Doppler, and power Doppler as needed of all accessible portions of each vessel. Bilateral testing is considered an integral part of a complete examination. Limited examinations for reoccurring indications may be performed as noted.  +-----------+--------+-----+--------+----------+--------+ RIGHT      PSV cm/sRatioStenosisWaveform  Comments +-----------+--------+-----+--------+----------+--------+ CFA Prox   93                   biphasic           +-----------+--------+-----+--------+----------+--------+ DFA        88                   biphasic           +-----------+--------+-----+--------+----------+--------+  SFA Prox   88                   biphasic           +-----------+--------+-----+--------+----------+--------+ SFA Mid    140                  biphasic           +-----------+--------+-----+--------+----------+--------+ SFA Distal 101                  biphasic           +-----------+--------+-----+--------+----------+--------+ POP Prox   68                   biphasic           +-----------+--------+-----+--------+----------+--------+ POP Distal 47                   biphasic           +-----------+--------+-----+--------+----------+--------+ TP Trunk   66  biphasic           +-----------+--------+-----+--------+----------+--------+ ATA Prox   61                   monophasic         +-----------+--------+-----+--------+----------+--------+ ATA Mid    93                   monophasic         +-----------+--------+-----+--------+----------+--------+ ATA Distal 90                   monophasic         +-----------+--------+-----+--------+----------+--------+ PTA Prox   39                   monophasic         +-----------+--------+-----+--------+----------+--------+ PTA Mid                 occluded                   +-----------+--------+-----+--------+----------+--------+ PTA Distal              occluded                   +-----------+--------+-----+--------+----------+--------+ PERO Prox  69                   monophasic         +-----------+--------+-----+--------+----------+--------+ PERO Mid                occluded                   +-----------+--------+-----+--------+----------+--------+ PERO Distal93                   monophasic         +-----------+--------+-----+--------+----------+--------+  +-----------+--------+-----+--------+----------+--------+ LEFT       PSV cm/sRatioStenosisWaveform  Comments +-----------+--------+-----+--------+----------+--------+ CFA Prox   111                   biphasic           +-----------+--------+-----+--------+----------+--------+ DFA        86                   biphasic           +-----------+--------+-----+--------+----------+--------+ SFA Prox   71                   biphasic           +-----------+--------+-----+--------+----------+--------+ SFA Mid    95                   biphasic           +-----------+--------+-----+--------+----------+--------+ SFA Distal 72                   biphasic           +-----------+--------+-----+--------+----------+--------+ POP Prox   65                   biphasic           +-----------+--------+-----+--------+----------+--------+ POP Distal 54                   biphasic           +-----------+--------+-----+--------+----------+--------+ TP Trunk   65                   biphasic           +-----------+--------+-----+--------+----------+--------+  ATA Prox   44                   monophasic         +-----------+--------+-----+--------+----------+--------+ ATA Mid    76                   monophasic         +-----------+--------+-----+--------+----------+--------+ ATA Distal 49                   monophasic         +-----------+--------+-----+--------+----------+--------+ PTA Prox   106                  monophasic         +-----------+--------+-----+--------+----------+--------+ PTA Mid                 occluded                   +-----------+--------+-----+--------+----------+--------+ PTA Distal              occluded                   +-----------+--------+-----+--------+----------+--------+ PERO Prox  48                   monophasic         +-----------+--------+-----+--------+----------+--------+ PERO Mid   70                   monophasic         +-----------+--------+-----+--------+----------+--------+ PERO Distal             occluded                   +-----------+--------+-----+--------+----------+--------+  Summary:  Right: Atherosclerosis in the common femoral, femoral, popliteal and tibial arteries. Posterior tibial artery appears to be occluded in the mid and distal protion. Peroneal artery appears to be occluded in the mid portion and reconstitutes distally. Left: Atherosclerosis in the common femoral, femoral, popliteal and tibial arteries. Posterior tibial artery appears to be occluded in the mid and distal protion. Peroneal artery appears to be occluded in the distal portion.  See table(s) above for measurements and observations. Vascular consult recommended. Electronically signed by Ida Rogue MD on 12/02/2020 at 7:52:32 AM.    Final    IR Radiologist Eval & Mgmt  Result Date: 12/04/2020 Please refer to notes tab for details about interventional procedure. (Op Note)   Labs:  CBC: Recent Labs    06/25/20 1207 08/11/20 1058  WBC 4.7 5.5  HGB 11.2* 12.0  HCT 33.7* 37.3  PLT 204 227    COAGS: No results for input(s): INR, APTT in the last 8760 hours.  BMP: Recent Labs    08/11/20 1058 08/19/20 1515  NA 141 136  K 4.7 4.2  CL 101 97*  CO2 26 26  GLUCOSE 261* 282*  BUN 17 17  CALCIUM 9.6 9.5  CREATININE 0.87 0.79  GFRNONAA 64 >60  GFRAA 74  --     LIVER FUNCTION TESTS: No results for input(s): BILITOT, AST, ALT, ALKPHOS, PROT, ALBUMIN in the last 8760 hours.  TUMOR MARKERS: No results for input(s): AFPTM, CEA, CA199, CHROMGRNA in the last 8760 hours.  Assessment and Plan:  80 year-old female with a poorly healing wound to the plantar surface of the foot underlying the 4th metatarsal head.  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   Carrie Kane would benefit from angiogram and attempted vascular optimization.  I explained the procedure in detail to her and she feels uncertain if she would like to proceed.  She prefers to digest and think about this information and will  call our office if she would like to proceed.   If willing, we would then schedule for LLE angiogram and intervention.    Thank you for this interesting consult.  I greatly enjoyed meeting Carrie Kane and look forward to participating in their care.  A copy of this report was sent to the requesting provider on this date.  Electronically Signed: Criselda Peaches 12/04/2020, 3:48 PM   I spent a total of 30 Minutes  in face to face in clinical consultation, greater than 50% of which was counseling/coordinating care for [place] category 5 critical limb ischemia.

## 2020-12-10 ENCOUNTER — Other Ambulatory Visit: Payer: Self-pay | Admitting: Interventional Radiology

## 2020-12-16 ENCOUNTER — Ambulatory Visit: Payer: Medicare Other | Admitting: Podiatry

## 2020-12-16 ENCOUNTER — Ambulatory Visit: Payer: Medicare Other | Admitting: Internal Medicine

## 2020-12-18 ENCOUNTER — Ambulatory Visit: Payer: Medicare Other | Admitting: Podiatry

## 2020-12-23 ENCOUNTER — Ambulatory Visit (INDEPENDENT_AMBULATORY_CARE_PROVIDER_SITE_OTHER): Payer: Medicare Other | Admitting: Podiatry

## 2020-12-23 ENCOUNTER — Ambulatory Visit (INDEPENDENT_AMBULATORY_CARE_PROVIDER_SITE_OTHER): Payer: Medicare Other | Admitting: Internal Medicine

## 2020-12-23 ENCOUNTER — Encounter: Payer: Self-pay | Admitting: Podiatry

## 2020-12-23 ENCOUNTER — Encounter: Payer: Self-pay | Admitting: Internal Medicine

## 2020-12-23 ENCOUNTER — Other Ambulatory Visit: Payer: Self-pay

## 2020-12-23 VITALS — BP 160/80 | HR 54 | Ht 70.0 in | Wt 162.3 lb

## 2020-12-23 DIAGNOSIS — Z794 Long term (current) use of insulin: Secondary | ICD-10-CM

## 2020-12-23 DIAGNOSIS — L97522 Non-pressure chronic ulcer of other part of left foot with fat layer exposed: Secondary | ICD-10-CM | POA: Diagnosis not present

## 2020-12-23 DIAGNOSIS — I999 Unspecified disorder of circulatory system: Secondary | ICD-10-CM

## 2020-12-23 DIAGNOSIS — E782 Mixed hyperlipidemia: Secondary | ICD-10-CM

## 2020-12-23 DIAGNOSIS — E039 Hypothyroidism, unspecified: Secondary | ICD-10-CM | POA: Diagnosis not present

## 2020-12-23 DIAGNOSIS — E119 Type 2 diabetes mellitus without complications: Secondary | ICD-10-CM | POA: Diagnosis not present

## 2020-12-23 DIAGNOSIS — I739 Peripheral vascular disease, unspecified: Secondary | ICD-10-CM

## 2020-12-23 MED ORDER — NITROGLYCERIN 0.4 MG SL SUBL: 0.4000 mg | SUBLINGUAL_TABLET | SUBLINGUAL | 3 refills | Status: DC | PRN

## 2020-12-23 MED ORDER — ATORVASTATIN CALCIUM 40 MG PO TABS: 40.0000 mg | ORAL_TABLET | Freq: Every day | ORAL | 3 refills | Status: DC

## 2020-12-23 MED ORDER — FERROUS SULFATE 325 (65 FE) MG PO TBEC: 325.0000 mg | DELAYED_RELEASE_TABLET | Freq: Every day | ORAL | 3 refills | Status: AC

## 2020-12-23 MED ORDER — BENAZEPRIL HCL 40 MG PO TABS: 40.0000 mg | ORAL_TABLET | Freq: Every day | ORAL | 2 refills | Status: DC

## 2020-12-23 MED ORDER — CLONIDINE HCL 0.2 MG PO TABS: 0.2000 mg | ORAL_TABLET | Freq: Two times a day (BID) | ORAL | 3 refills | Status: DC

## 2020-12-23 MED ORDER — LEVOTHYROXINE SODIUM 100 MCG PO TABS: 100.0000 ug | ORAL_TABLET | Freq: Every day | ORAL | 3 refills | Status: DC

## 2020-12-23 MED ORDER — CARVEDILOL 3.125 MG PO TABS: 3.1250 mg | ORAL_TABLET | Freq: Two times a day (BID) | ORAL | 2 refills | Status: DC

## 2020-12-23 NOTE — Progress Notes (Signed)
Established Patient Office Visit  Subjective:  Patient ID: Carrie Kane, female    DOB: Feb 11, 1941  Age: 80 y.o. MRN: [id number]  CC:  Chief Complaint  Patient presents with  . Follow-up  . Diabetes    Patients blood sugar is 122    HPI  Carrie Kane presents forPatient came for regular checkup.  She was told to have claudication of the both legs and has seen the vascular specialist in [place] and she wants somebody locally to see her so I will send her to Dr. Lucky Cowboy for evaluation.  Patient is known to have coronary artery disease has a history of bypass surgery.  Also has a carotid disease.  He had a bypass surgery in 2018  Past Medical History:  Diagnosis Date  . CAD (coronary artery disease)    a. 04/2017 Cath: LM 80, LAD 80p, 39m, LCX 95ost, 101m, EF 45-50%; b. 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  . Diastolic dysfunction    a. 04/2017 Echo: EF 55-60%, no rwma, Gr1 DD, mildly dil LA.  Marland Kitchen Hyperlipidemia   . Hypertensive heart disease   . Insulin dependent diabetes mellitus     Past Surgical History:  Procedure Laterality Date  . ABDOMINAL HYSTERECTOMY    . CORONARY ARTERY BYPASS GRAFT N/A 05/09/2017   Procedure: CORONARY ARTERY BYPASS GRAFTING (CABG) x 3 using left internal mammary artery and right greater saphenous vein harvested endoscopically;  Surgeon: Ivin Poot, MD;  Location: [facility];  Service: Open Heart Surgery;  Laterality: N/A;  . INTRAOPERATIVE TRANSESOPHAGEAL ECHOCARDIOGRAM N/A 05/09/2017   Procedure: INTRAOPERATIVE TRANSESOPHAGEAL ECHOCARDIOGRAM;  Surgeon: Ivin Poot, MD;  Location: [facility];  Service: Open Heart Surgery;  Laterality: N/A;  . IR RADIOLOGIST EVAL & MGMT  12/04/2020  . LEFT HEART CATH AND CORONARY ANGIOGRAPHY N/A 05/02/2017   Procedure: Left Heart Cath and Coronary Angiography;  Surgeon: Wellington Hampshire, MD;  Location: [facility];  Service: Cardiovascular;  Laterality: N/A;    Family History  Problem Relation Age of  Onset  . Breast cancer Maternal Aunt     Social History   Socioeconomic History  . Marital status: Married    Spouse name: Not on file  . Number of children: Not on file  . Years of education: Not on file  . Highest education level: Not on file  Occupational History  . Not on file  Tobacco Use  . Smoking status: Never Smoker  . Smokeless tobacco: Never Used  Substance and Sexual Activity  . Alcohol use: No  . Drug use: No  . Sexual activity: Not on file  Other Topics Concern  . Not on file  Social History Narrative  . Not on file   Social Determinants of Health   Financial Resource Strain: Not on file  Food Insecurity: Not on file  Transportation Needs: Not on file  Physical Activity: Not on file  Stress: Not on file  Social Connections: Not on file  Intimate Partner Violence: Not on file     Current Outpatient Medications:  .  aspirin EC 81 MG tablet, Take 1 tablet (81 mg total) by mouth daily., Disp: 90 tablet, Rfl: 3 .  BD INSULIN SYRINGE U/F 30G X 1/2" 0.5 ML MISC, , Disp: , Rfl:  .  Chlorpheniramine Maleate (ALLERGY PO), Take 1 tablet by mouth daily as needed (allergies)., Disp: , Rfl:  .  cholecalciferol (VITAMIN D) 1000 units tablet, Take 2,000 Units by mouth daily at 12 noon. ,  Disp: , Rfl:  .  clopidogrel (PLAVIX) 75 MG tablet, TAKE 1 TABLET DAILY, Disp: 90 tablet, Rfl: 0 .  HUMALOG KWIKPEN 100 UNIT/ML KwikPen, , Disp: , Rfl:  .  HUMALOG MIX 75/25 KWIKPEN (75-25) 100 UNIT/ML Kwikpen, , Disp: , Rfl:  .  LANTUS 100 UNIT/ML injection, INJECT 45 UNITS UNDER THE SKIN AT BEDTIME, Disp: 40 mL, Rfl: 3 .  Multiple Vitamin (MULTIVITAMIN) capsule, Take 1 capsule by mouth daily at 12 noon. , Disp: , Rfl:  .  Polyethyl Glycol-Propyl Glycol (SYSTANE OP), Apply 1 drop to eye as needed (dry eyes)., Disp: , Rfl:  .  atorvastatin (LIPITOR) 40 MG tablet, Take 1 tablet (40 mg total) by mouth daily., Disp: 90 tablet, Rfl: 3 .  benazepril (LOTENSIN) 40 MG tablet, Take 1 tablet  (40 mg total) by mouth daily., Disp: 90 tablet, Rfl: 2 .  carvedilol (COREG) 3.125 MG tablet, Take 1 tablet (3.125 mg total) by mouth 2 (two) times daily with a meal., Disp: 180 tablet, Rfl: 2 .  cloNIDine (CATAPRES) 0.2 MG tablet, Take 1 tablet (0.2 mg total) by mouth 2 (two) times daily., Disp: 90 tablet, Rfl: 3 .  ferrous sulfate 325 (65 FE) MG EC tablet, Take 1 tablet (325 mg total) by mouth daily at 2 PM., Disp: 90 tablet, Rfl: 3 .  hydrochlorothiazide (MICROZIDE) 12.5 MG capsule, Take 1 capsule (12.5 mg total) by mouth daily., Disp: 90 capsule, Rfl: 2 .  levothyroxine (SYNTHROID) 100 MCG tablet, Take 1 tablet (100 mcg total) by mouth daily before breakfast., Disp: 90 tablet, Rfl: 3 .  nitroGLYCERIN (NITROSTAT) 0.4 MG SL tablet, Place 1 tablet (0.4 mg total) under the tongue every 5 (five) minutes as needed for chest pain., Disp: 90 tablet, Rfl: 3   Allergies  Allergen Reactions  . Flexeril [Cyclobenzaprine] Hypertension    ROS Review of Systems  Constitutional: Negative.   HENT: Negative.   Eyes: Negative.   Respiratory: Negative.   Cardiovascular: Negative.   Gastrointestinal: Negative.   Endocrine: Negative.   Genitourinary: Negative.   Musculoskeletal: Negative.   Skin: Negative.   Allergic/Immunologic: Negative.   Neurological: Negative.   Hematological: Negative.   Psychiatric/Behavioral: Negative.   All other systems reviewed and are negative.     Objective:    Physical Exam Vitals reviewed.  Constitutional:      Appearance: Normal appearance.  HENT:     Mouth/Throat:     Mouth: Mucous membranes are moist.  Eyes:     Pupils: Pupils are equal, round, and reactive to light.  Neck:     Vascular: No carotid bruit.  Cardiovascular:     Rate and Rhythm: Normal rate and regular rhythm.     Pulses: Normal pulses.     Heart sounds: Normal heart sounds.  Pulmonary:     Effort: Pulmonary effort is normal.     Breath sounds: Normal breath sounds.  Abdominal:      General: Bowel sounds are normal.     Palpations: Abdomen is soft. There is no hepatomegaly, splenomegaly or mass.     Tenderness: There is no abdominal tenderness.     Hernia: No hernia is present.  Musculoskeletal:        General: No tenderness.     Cervical back: Neck supple.     Right lower leg: No edema.     Left lower leg: No edema.  Skin:    Findings: No rash.  Neurological:     Mental Status: She is alert  and oriented to person, place, and time.     Motor: No weakness.  Psychiatric:        Mood and Affect: Mood and affect normal.        Behavior: Behavior normal.     BP (!) 160/80   Pulse (!) 54   Ht 5\' 10"  (1.778 m)   Wt 162 lb 4.8 oz (73.6 kg)   BMI 23.29 kg/m  Wt Readings from Last 3 Encounters:  12/23/20 162 lb 4.8 oz (73.6 kg)  09/16/20 160 lb 3.2 oz (72.7 kg)  08/11/20 157 lb (71.2 kg)     Health Maintenance Due  Topic Date Due  . Hepatitis C Screening  Never done  . COVID-19 Vaccine (1) Never done  . TETANUS/TDAP  Never done  . PNA vac Low Risk Adult (1 of 2 - PCV13) Never done  . HEMOGLOBIN A1C  11/03/2017    There are no preventive care reminders to display for this patient.  Lab Results  Component Value Date   TSH 0.372 05/03/2017   Lab Results  Component Value Date   WBC 5.5 08/11/2020   HGB 12.0 08/11/2020   HCT 37.3 08/11/2020   MCV 93 08/11/2020   PLT 227 08/11/2020   Lab Results  Component Value Date   NA 136 08/19/2020   K 4.2 08/19/2020   CO2 26 08/19/2020   GLUCOSE 282 (H) 08/19/2020   BUN 17 08/19/2020   CREATININE 0.79 08/19/2020   CALCIUM 9.5 08/19/2020   ANIONGAP 13 08/19/2020   Lab Results  Component Value Date   CHOL 132 08/11/2020   Lab Results  Component Value Date   HDL 68 08/11/2020   Lab Results  Component Value Date   LDLCALC 49 08/11/2020   Lab Results  Component Value Date   TRIG 79 08/11/2020   Lab Results  Component Value Date   CHOLHDL 1.9 08/11/2020   Lab Results  Component Value Date    HGBA1C 8.4 (H) 05/03/2017      Assessment & Plan:   Problem List Items Addressed This Visit      Endocrine   Diabetes mellitus type 2, insulin dependent ([place])    - The patient's blood sugar is under control on med. - The patient will continue the current treatment regimen.  - I encouraged the patient to regularly check blood sugar.  - I encouraged the patient to monitor diet. I encouraged the patient to eat low-carb and low-sugar to help prevent blood sugar spikes.  - I encouraged the patient to continue following their prescribed treatment plan for diabetes - I informed the patient to get help if blood sugar drops below 54mg /dL, or if suddenly have trouble thinking clearly or breathing.       Relevant Medications   atorvastatin (LIPITOR) 40 MG tablet   benazepril (LOTENSIN) 40 MG tablet   Other Relevant Orders   Hemoglobin A1c   Microalbumin, urine   Hypothyroidism    Patient was advised to continue her present medication.      Relevant Medications   carvedilol (COREG) 3.125 MG tablet   levothyroxine (SYNTHROID) 100 MCG tablet   Other Relevant Orders   CBC with Differential/Platelet   Lipid panel   TSH   COMPLETE METABOLIC PANEL WITH GFR   Hemoglobin A1c   Microalbumin, urine     Other   Mixed hyperlipidemia    - The patient's hyperlipidemia is stable on med.  We will check a lipid level again. - The  patient will continue the current treatment regimen.  Patient will be referred to Dr. Lucky Cowboy. - I encouraged the patient to eat more vegetables and whole wheat, and to avoid fatty foods like whole milk, hard cheese, egg yolks, margarine, baked sweets, and fried foods.  - I encouraged the patient to live an active lifestyle and complete activities for 40 minutes at least three times per week.  - I instructed the patient to go to the ER if they begin having chest pain.       Relevant Medications   atorvastatin (LIPITOR) 40 MG tablet   benazepril (LOTENSIN) 40 MG tablet    carvedilol (COREG) 3.125 MG tablet   cloNIDine (CATAPRES) 0.2 MG tablet   nitroGLYCERIN (NITROSTAT) 0.4 MG SL tablet   Claudication (HCC) - Primary    Patient complains of pain in the legs on walking.  She want to see a vascular specialist here in [place] so we will send her to Dr. Lucky Cowboy for evaluation.      Relevant Orders   Ambulatory referral to Vascular Surgery   CBC with Differential/Platelet   Lipid panel   TSH   COMPLETE METABOLIC PANEL WITH GFR   Hemoglobin A1c   Microalbumin, urine      Meds ordered this encounter  Medications  . atorvastatin (LIPITOR) 40 MG tablet    Sig: Take 1 tablet (40 mg total) by mouth daily.    Dispense:  90 tablet    Refill:  3  . benazepril (LOTENSIN) 40 MG tablet    Sig: Take 1 tablet (40 mg total) by mouth daily.    Dispense:  90 tablet    Refill:  2    Stopping benazepril-hctz  . carvedilol (COREG) 3.125 MG tablet    Sig: Take 1 tablet (3.125 mg total) by mouth 2 (two) times daily with a meal.    Dispense:  180 tablet    Refill:  2    Stopping metoprolol  . cloNIDine (CATAPRES) 0.2 MG tablet    Sig: Take 1 tablet (0.2 mg total) by mouth 2 (two) times daily.    Dispense:  90 tablet    Refill:  3  . ferrous sulfate 325 (65 FE) MG EC tablet    Sig: Take 1 tablet (325 mg total) by mouth daily at 2 PM.    Dispense:  90 tablet    Refill:  3  . levothyroxine (SYNTHROID) 100 MCG tablet    Sig: Take 1 tablet (100 mcg total) by mouth daily before breakfast.    Dispense:  90 tablet    Refill:  3  . nitroGLYCERIN (NITROSTAT) 0.4 MG SL tablet    Sig: Place 1 tablet (0.4 mg total) under the tongue every 5 (five) minutes as needed for chest pain.    Dispense:  90 tablet    Refill:  3    Follow-up: No follow-ups on file.    Cletis Athens, MD

## 2020-12-23 NOTE — Assessment & Plan Note (Signed)
Patient complains of pain in the legs on walking.  She want to see a vascular specialist here in [place] so we will send her to Dr. Lucky Cowboy for evaluation.

## 2020-12-23 NOTE — Assessment & Plan Note (Signed)

## 2020-12-23 NOTE — Assessment & Plan Note (Signed)
-   The patient's hyperlipidemia is stable on med.  We will check a lipid level again. - The patient will continue the current treatment regimen.  Patient will be referred to Dr. Lucky Cowboy. - I encouraged the patient to eat more vegetables and whole wheat, and to avoid fatty foods like whole milk, hard cheese, egg yolks, margarine, baked sweets, and fried foods.  - I encouraged the patient to live an active lifestyle and complete activities for 40 minutes at least three times per week.  - I instructed the patient to go to the ER if they begin having chest pain.

## 2020-12-23 NOTE — Assessment & Plan Note (Signed)
Patient was advised to continue her present medication.

## 2020-12-24 ENCOUNTER — Encounter: Payer: Self-pay | Admitting: Podiatry

## 2020-12-24 NOTE — Progress Notes (Signed)
Subjective:  Patient ID: Carrie Kane, female    DOB: 03/15/1941,  MRN: [id number]  Chief Complaint  Patient presents with  . Wound Check    "Its about the same.  It still hurts and needs to be trimmed"    80 y.o. female presents for wound care.  Patient presents with complaint of left submetatarsal 4 ulceration that is probing down to deep tissue.  Patient is a diabetic with last A1c of 8.4.  She states it has progressively gotten worse she is worried that she may end up losing the toe.  She denies any other acute complaints there is dryness to which she states that she has been doing dressing changes right Neosporin Emma dressing.  She denies any other acute complaints.  She has not seen anyone since seeing me last time when there was no ulceration.  She has had a bad time controlling the sugar.   Review of Systems: Negative except as noted in the HPI. Denies N/V/F/Ch.  Past Medical History:  Diagnosis Date  . CAD (coronary artery disease)    a. 04/2017 Cath: LM 80, LAD 80p, 53m, LCX 95ost, 27m, EF 45-50%; b. 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  . Diastolic dysfunction    a. 04/2017 Echo: EF 55-60%, no rwma, Gr1 DD, mildly dil LA.  Marland Kitchen Hyperlipidemia   . Hypertensive heart disease   . Insulin dependent diabetes mellitus     Current Outpatient Medications:  .  aspirin EC 81 MG tablet, Take 1 tablet (81 mg total) by mouth daily., Disp: 90 tablet, Rfl: 3 .  atorvastatin (LIPITOR) 40 MG tablet, Take 1 tablet (40 mg total) by mouth daily., Disp: 90 tablet, Rfl: 3 .  BD INSULIN SYRINGE U/F 30G X 1/2" 0.5 ML MISC, , Disp: , Rfl:  .  benazepril (LOTENSIN) 40 MG tablet, Take 1 tablet (40 mg total) by mouth daily., Disp: 90 tablet, Rfl: 2 .  carvedilol (COREG) 3.125 MG tablet, Take 1 tablet (3.125 mg total) by mouth 2 (two) times daily with a meal., Disp: 180 tablet, Rfl: 2 .  Chlorpheniramine Maleate (ALLERGY PO), Take 1 tablet by mouth daily as needed (allergies)., Disp: , Rfl:  .   cholecalciferol (VITAMIN D) 1000 units tablet, Take 2,000 Units by mouth daily at 12 noon. , Disp: , Rfl:  .  cloNIDine (CATAPRES) 0.2 MG tablet, Take 1 tablet (0.2 mg total) by mouth 2 (two) times daily., Disp: 90 tablet, Rfl: 3 .  clopidogrel (PLAVIX) 75 MG tablet, TAKE 1 TABLET DAILY, Disp: 90 tablet, Rfl: 0 .  ferrous sulfate 325 (65 FE) MG EC tablet, Take 1 tablet (325 mg total) by mouth daily at 2 PM., Disp: 90 tablet, Rfl: 3 .  HUMALOG KWIKPEN 100 UNIT/ML KwikPen, , Disp: , Rfl:  .  HUMALOG MIX 75/25 KWIKPEN (75-25) 100 UNIT/ML Kwikpen, , Disp: , Rfl:  .  hydrochlorothiazide (MICROZIDE) 12.5 MG capsule, Take 1 capsule (12.5 mg total) by mouth daily., Disp: 90 capsule, Rfl: 2 .  LANTUS 100 UNIT/ML injection, INJECT 45 UNITS UNDER THE SKIN AT BEDTIME, Disp: 40 mL, Rfl: 3 .  levothyroxine (SYNTHROID) 100 MCG tablet, Take 1 tablet (100 mcg total) by mouth daily before breakfast., Disp: 90 tablet, Rfl: 3 .  Multiple Vitamin (MULTIVITAMIN) capsule, Take 1 capsule by mouth daily at 12 noon. , Disp: , Rfl:  .  nitroGLYCERIN (NITROSTAT) 0.4 MG SL tablet, Place 1 tablet (0.4 mg total) under the tongue every 5 (five) minutes as needed for  chest pain., Disp: 90 tablet, Rfl: 3 .  Polyethyl Glycol-Propyl Glycol (SYSTANE OP), Apply 1 drop to eye as needed (dry eyes)., Disp: , Rfl:   Social History   Tobacco Use  Smoking Status Never Smoker  Smokeless Tobacco Never Used    Allergies  Allergen Reactions  . Flexeril [Cyclobenzaprine] Hypertension   Objective:  There were no vitals filed for this visit. There is no height or weight on file to calculate BMI. Constitutional Well developed. Well nourished.  Vascular Dorsalis pedis pulses faintly palpable bilaterally. Posterior tibial pulses  Faintly palpable bilaterally. Capillary refill normal to all digits.  No cyanosis or clubbing noted. Pedal hair growth normal.  Neurologic Normal speech. Oriented to person, place, and time. Protective  sensation absent  Dermatologic Wound Location: Left submetatarsal 4 ulceration with fat layer exposed and no longer probes down to deep tissue. Wound Base: Mixed Granular/Fibrotic Peri-wound: Calloused Exudate: Scant/small amount Serosanguinous exudate Wound Measurements: -See below  Orthopedic: No pain to palpation either foot.   Radiographs: None Assessment:   1. PVD (peripheral vascular disease) (HCC)   2. Vascular abnormality   3. Diabetes mellitus type 2, insulin dependent ([place])   4. Ulcer of left foot with fat layer exposed ([place])    Plan:  Patient was evaluated and treated and all questions answered.  Ulcer left submetatarsal 4 ulceration limited to the breakdown of the skin -Debridement as below. -I will hold off on dressing changes for now as patient is healed however the skin is very fragile and can easily break down again. -Continue off-loading with surgical shoe. -Patient is a high risk of losing the fourth digit versus a metatarsal if this continues to progress and gets worse.  I discussed this with patient extended detail patient may benefit from offloading osteotomy as well.  I still believe that patient will benefit from floating osteotomy however I will not proceed until she has flow to the lower extremity.  The ulceration at this time is doing really well however the plantar flex nature of the underlying metatarsal will and can lead to re ulceration.. -ABIs PVRs were reviewed with the patient which showed vascular compromise to both lower extremity including the left side.  I discussed this with the patient.  She will like to undergo angiogram however she would like to do it in [place] as she does not have any transportation to [place]. -I will place a referral in for [facility]. No follow-ups on file.

## 2021-01-09 ENCOUNTER — Other Ambulatory Visit: Payer: Self-pay | Admitting: Internal Medicine

## 2021-01-13 ENCOUNTER — Other Ambulatory Visit: Payer: Self-pay

## 2021-01-13 ENCOUNTER — Ambulatory Visit (INDEPENDENT_AMBULATORY_CARE_PROVIDER_SITE_OTHER): Payer: Medicare Other | Admitting: Nurse Practitioner

## 2021-01-13 VITALS — BP 230/80 | HR 76 | Ht 68.0 in | Wt 161.0 lb

## 2021-01-13 DIAGNOSIS — I10 Essential (primary) hypertension: Secondary | ICD-10-CM | POA: Diagnosis not present

## 2021-01-13 DIAGNOSIS — E782 Mixed hyperlipidemia: Secondary | ICD-10-CM

## 2021-01-13 DIAGNOSIS — I739 Peripheral vascular disease, unspecified: Secondary | ICD-10-CM | POA: Diagnosis not present

## 2021-01-13 DIAGNOSIS — L02612 Cutaneous abscess of left foot: Secondary | ICD-10-CM

## 2021-01-13 NOTE — Progress Notes (Signed)
Pt was instructed per Dr. Lucky Cowboy that she should go to the ED with BP so high. The pt declined an said that they will just take evening BP medication.

## 2021-01-14 ENCOUNTER — Telehealth (INDEPENDENT_AMBULATORY_CARE_PROVIDER_SITE_OTHER): Payer: Self-pay

## 2021-01-14 NOTE — Telephone Encounter (Signed)
I contacted the patient regarding getting scheduled for a LLE & RLE angio with Dr. Lucky Cowboy. Patient was offered two dates and needed to speak with someone about transportation, so she stated she would call back to schedule.

## 2021-01-18 ENCOUNTER — Encounter (INDEPENDENT_AMBULATORY_CARE_PROVIDER_SITE_OTHER): Payer: Self-pay | Admitting: Nurse Practitioner

## 2021-01-18 DIAGNOSIS — I10 Essential (primary) hypertension: Secondary | ICD-10-CM | POA: Insufficient documentation

## 2021-01-18 NOTE — Progress Notes (Signed)
Subjective:    Patient ID: Carrie Kane, female    DOB: [birth year], 80 y.o.   MRN: [id number] Chief Complaint  Patient presents with  . New Patient (Initial Visit)  . PVD    Carrie Kane is a 80 year old female that is seen for evaluation of painful lower extremities and diminished pulses associated with ulceration of the foot.  She is referred by her podiatrist Dr. Posey Pronto the patient notes the ulcer has been present for multiple weeks and it is slowly improving.  It is very painful and has had some drainage.  No specific history of trauma noted by the patient.  The patient denies fever or chills.  the patient does have diabetes which has been difficult to control.  She had ABIs done at Dr. Theda Sers office on 12/02/2020.  Patient notes prior to the ulcer developing the extremities were painful particularly with ambulation or activity and the discomfort is very consistent day today. Typically, the pain occurs at less than one block, progress is as activity continues to the point that the patient must stop walking. Resting including standing still for several minutes allowed resumption of the activity and the ability to walk a similar distance before stopping again. Uneven terrain and inclined shorten the distance. The pain has been progressive over the past several years.   The patient denies rest pain or dangling of an extremity off the side of the bed during the night for relief. No prior interventions or surgeries.  No history of back problems or DJD of the lumbar sacral spine.   The patient denies amaurosis fugax or recent TIA symptoms. There are no recent neurological changes noted. The patient denies history of DVT, PE or superficial thrombophlebitis. The patient denies recent episodes of angina or shortness of breath.   Patient had recent ABIs done.  The patient has a right ABI of 0.78 with a left of 0.76.  She has a right TBI 0.26 with a left of 0.11.  The patient has dampened  monophasic waveform seen bilaterally with reduced toe waveforms bilaterally.   Review of Systems  Cardiovascular:       Claudication  Skin: Positive for wound.  All other systems reviewed and are negative.      Objective:   Physical Exam Vitals reviewed.  HENT:     Head: Normocephalic.  Cardiovascular:     Rate and Rhythm: Normal rate.     Pulses:          Dorsalis pedis pulses are detected w/ Doppler on the right side and detected w/ Doppler on the left side.       Posterior tibial pulses are detected w/ Doppler on the right side and detected w/ Doppler on the left side.  Pulmonary:     Effort: Pulmonary effort is normal.  Feet:     Left foot:     Skin integrity: Ulcer present.  Neurological:     Mental Status: She is alert and oriented to person, place, and time.  Psychiatric:        Mood and Affect: Mood normal.        Behavior: Behavior normal.        Thought Content: Thought content normal.        Judgment: Judgment normal.     BP (!) 230/80 Comment: Right Arm [place]  Pulse 76   Ht 5\' 8"  (1.727 m)   Wt 161 lb (73 kg)   BMI 24.48 kg/m   Past Medical History:  Diagnosis Date  . CAD (coronary artery disease)    a. 04/2017 Cath: LM 80, LAD 80p, 74m, LCX 95ost, 58m, EF 45-50%; b. 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  . Diastolic dysfunction    a. 04/2017 Echo: EF 55-60%, no rwma, Gr1 DD, mildly dil LA.  Marland Kitchen Hyperlipidemia   . Hypertensive heart disease   . Insulin dependent diabetes mellitus     Social History   Socioeconomic History  . Marital status: Married    Spouse name: Not on file  . Number of children: Not on file  . Years of education: Not on file  . Highest education level: Not on file  Occupational History  . Not on file  Tobacco Use  . Smoking status: Never Smoker  . Smokeless tobacco: Never Used  Substance and Sexual Activity  . Alcohol use: No  . Drug use: No  . Sexual activity: Not on file  Other Topics Concern  . Not on  file  Social History Narrative  . Not on file   Social Determinants of Health   Financial Resource Strain: Not on file  Food Insecurity: Not on file  Transportation Needs: Not on file  Physical Activity: Not on file  Stress: Not on file  Social Connections: Not on file  Intimate Partner Violence: Not on file    Past Surgical History:  Procedure Laterality Date  . ABDOMINAL HYSTERECTOMY    . CORONARY ARTERY BYPASS GRAFT N/A 05/09/2017   Procedure: CORONARY ARTERY BYPASS GRAFTING (CABG) x 3 using left internal mammary artery and right greater saphenous vein harvested endoscopically;  Surgeon: Ivin Poot, MD;  Location: [facility];  Service: Open Heart Surgery;  Laterality: N/A;  . INTRAOPERATIVE TRANSESOPHAGEAL ECHOCARDIOGRAM N/A 05/09/2017   Procedure: INTRAOPERATIVE TRANSESOPHAGEAL ECHOCARDIOGRAM;  Surgeon: Ivin Poot, MD;  Location: [facility];  Service: Open Heart Surgery;  Laterality: N/A;  . IR RADIOLOGIST EVAL & MGMT  12/04/2020  . LEFT HEART CATH AND CORONARY ANGIOGRAPHY N/A 05/02/2017   Procedure: Left Heart Cath and Coronary Angiography;  Surgeon: Wellington Hampshire, MD;  Location: [facility];  Service: Cardiovascular;  Laterality: N/A;    Family History  Problem Relation Age of Onset  . Breast cancer Maternal Aunt     Allergies  Allergen Reactions  . Flexeril [Cyclobenzaprine] Hypertension    CBC Latest Ref Rng & Units 08/11/2020 06/25/2020 05/13/2017  WBC 3.4 - 10.8 x10E3/uL 5.5 4.7 10.2  Hemoglobin 11.1 - 15.9 g/dL 12.0 11.2(L) 10.3(L)  Hematocrit 34.0 - 46.6 % 37.3 33.7(L) 31.0(L)  Platelets 150 - 450 x10E3/uL 227 204 198      CMP     Component Value Date/Time   NA 136 08/19/2020 1515   NA 141 08/11/2020 1058   K 4.2 08/19/2020 1515   CL 97 (L) 08/19/2020 1515   CO2 26 08/19/2020 1515   GLUCOSE 282 (H) 08/19/2020 1515   BUN 17 08/19/2020 1515   BUN 17 08/11/2020 1058   CREATININE 0.79 08/19/2020 1515   CALCIUM 9.5 08/19/2020 1515   GFRNONAA  >60 08/19/2020 1515   GFRAA 74 08/11/2020 1058     VAS [place] ABI WITH/WO TBI  Result Date: 12/02/2020 LOWER EXTREMITY DOPPLER STUDY Indications: Peripheral artery disease. High Risk Factors: Hypertension, hyperlipidemia, Diabetes, no history of                    smoking, coronary artery disease. Other Factors: Physician noted decreased pulses, bilaterally. Patient denies any  claudication symptoms.  Vascular Interventions: CABG x 3. Performing Technologist: Wilkie Aye RVT  Examination Guidelines: A complete evaluation includes at minimum, Doppler waveform signals and systolic blood pressure reading at the level of bilateral brachial, anterior tibial, and posterior tibial arteries, when vessel segments are accessible. Bilateral testing is considered an integral part of a complete examination. Photoelectric Plethysmograph (PPG) waveforms and toe systolic pressure readings are included as required and additional duplex testing as needed. Limited examinations for reoccurring indications may be performed as noted.  ABI Findings: +---------+------------------+-----+-------------------+--------+ Right    Rt Pressure (mmHg)IndexWaveform           Comment  +---------+------------------+-----+-------------------+--------+ Brachial 213                                                +---------+------------------+-----+-------------------+--------+ ATA      167               0.78 dampened monophasic         +---------+------------------+-----+-------------------+--------+ PTA      148               0.69 dampened monophasic         +---------+------------------+-----+-------------------+--------+ PERO     156               0.73 dampened monophasic         +---------+------------------+-----+-------------------+--------+ Great Toe55                0.26 Abnormal                    +---------+------------------+-----+-------------------+--------+  +---------+------------------+-----+-------------------+-------+ Left     Lt Pressure (mmHg)IndexWaveform           Comment +---------+------------------+-----+-------------------+-------+ Brachial 211                                               +---------+------------------+-----+-------------------+-------+ ATA      161               0.76 dampened monophasic        +---------+------------------+-----+-------------------+-------+ PTA      130               0.61 dampened monophasic        +---------+------------------+-----+-------------------+-------+ PERO     177               0.83 dampened monophasic        +---------+------------------+-----+-------------------+-------+ Great Toe23                0.11 Abnormal                   +---------+------------------+-----+-------------------+-------+ +-------+-----------+-----------+------------+------------+ ABI/TBIToday's ABIToday's TBIPrevious ABIPrevious TBI +-------+-----------+-----------+------------+------------+ Right  0.78       026        0.71                     +-------+-----------+-----------+------------+------------+ Left   0.76       0.11       [place]                       +-------+-----------+-----------+------------+------------+  TOES Findings: +----------+--------+-------+ Right ToesWaveformComment +----------+--------+-------+ 1st Digit Abnormal        +----------+--------+-------+  2nd Digit Abnormal        +----------+--------+-------+ 3rd Digit Abnormal        +----------+--------+-------+ 4th Digit Abnormal        +----------+--------+-------+ 5th Digit Abnormal        +----------+--------+-------+  +---------+--------+-------+ Left ToesWaveformComment +---------+--------+-------+ 1st DigitAbnormal        +---------+--------+-------+ 2nd DigitAbnormal        +---------+--------+-------+ 3rd DigitAbnormal        +---------+--------+-------+ 4th  DigitAbnormal        +---------+--------+-------+ 5th DigitAbnormal        +---------+--------+-------+   Right ABIs appear essentially unchanged compared to prior study on 04/2017.  Summary: Right: Resting right ankle-brachial index indicates moderate right lower extremity arterial disease. The right toe-brachial index is abnormal. PPG tracings appear dampened. Left: Resting left ankle-brachial index indicates moderate left lower extremity arterial disease. The left toe-brachial index is abnormal. PPG tracings appear dampened.  *See table(s) above for measurements and observations.  Electronically signed by Ida Rogue MD on 12/02/2020 at 7:48:29 AM.    Final        Assessment & Plan:   1. PAD (peripheral artery disease) (HCC)  Recommend:  The patient has evidence of severe atherosclerotic changes of both lower extremities associated with ulceration and tissue loss of the foot.  This represents a limb threatening ischemia and places the patient at the risk for limb loss.  Patient should undergo angiography of the lower extremities with the hope for intervention for limb salvage.  The risks and benefits as well as the alternative therapies was discussed in detail with the patient.  All questions were answered.  Patient agrees to proceed with angiography.  The patient will follow up with me in the office after the procedure.    2. Mixed hyperlipidemia Continue statin as ordered and reviewed, no changes at this time   3. Abscess of left foot  this will continue to be followed by podiatry.  Is nearly healed at this point.  4. Essential hypertension Continue antihypertensive medications as already ordered, these medications have been reviewed and there are no changes at this time.    Current Outpatient Medications on File Prior to Visit  Medication Sig Dispense Refill  . aspirin EC 81 MG tablet Take 1 tablet (81 mg total) by mouth daily. 90 tablet 3  . atorvastatin (LIPITOR) 40 MG  tablet Take 1 tablet (40 mg total) by mouth daily. 90 tablet 3  . BD INSULIN SYRINGE U/F 30G X 1/2" 0.5 ML MISC     . benazepril (LOTENSIN) 40 MG tablet Take 1 tablet (40 mg total) by mouth daily. 90 tablet 2  . carvedilol (COREG) 3.125 MG tablet Take 1 tablet (3.125 mg total) by mouth 2 (two) times daily with a meal. 180 tablet 2  . Chlorpheniramine Maleate (ALLERGY PO) Take 1 tablet by mouth daily as needed (allergies).    . cholecalciferol (VITAMIN D) 1000 units tablet Take 2,000 Units by mouth daily at 12 noon.     . cloNIDine (CATAPRES) 0.2 MG tablet Take 1 tablet (0.2 mg total) by mouth 2 (two) times daily. 90 tablet 3  . clopidogrel (PLAVIX) 75 MG tablet TAKE 1 TABLET DAILY 90 tablet 3  . ferrous sulfate 325 (65 FE) MG EC tablet Take 1 tablet (325 mg total) by mouth daily at 2 PM. 90 tablet 3  . HUMALOG KWIKPEN 100 UNIT/ML KwikPen INJECT 38 UNITS UNDER THE SKIN DAILY 45 mL 2  . HUMALOG MIX 75/25  KWIKPEN (75-25) 100 UNIT/ML Kwikpen     . LANTUS 100 UNIT/ML injection INJECT 45 UNITS UNDER THE SKIN AT BEDTIME 40 mL 3  . levothyroxine (SYNTHROID) 100 MCG tablet Take 1 tablet (100 mcg total) by mouth daily before breakfast. 90 tablet 3  . Multiple Vitamin (MULTIVITAMIN) capsule Take 1 capsule by mouth daily at 12 noon.     . nitroGLYCERIN (NITROSTAT) 0.4 MG SL tablet Place 1 tablet (0.4 mg total) under the tongue every 5 (five) minutes as needed for chest pain. 90 tablet 3  . Polyethyl Glycol-Propyl Glycol (SYSTANE OP) Apply 1 drop to eye as needed (dry eyes).    . hydrochlorothiazide (MICROZIDE) 12.5 MG capsule Take 1 capsule (12.5 mg total) by mouth daily. 90 capsule 2   No current facility-administered medications on file prior to visit.    There are no Patient Instructions on file for this visit. No follow-ups on file.   Kris Hartmann, NP

## 2021-01-18 NOTE — H&P (View-Only) (Signed)
Subjective:    Patient ID: Carrie Kane, female    DOB: 1941-03-08, 80 y.o.   MRN: [id number] Chief Complaint  Patient presents with  . New Patient (Initial Visit)  . PVD    Carrie Kane is a 79 year old female that is seen for evaluation of painful lower extremities and diminished pulses associated with ulceration of the foot.  She is referred by her podiatrist Dr. Posey Pronto the patient notes the ulcer has been present for multiple weeks and it is slowly improving.  It is very painful and has had some drainage.  No specific history of trauma noted by the patient.  The patient denies fever or chills.  the patient does have diabetes which has been difficult to control.  She had ABIs done at Dr. Theda Sers office on 12/02/2020.  Patient notes prior to the ulcer developing the extremities were painful particularly with ambulation or activity and the discomfort is very consistent day today. Typically, the pain occurs at less than one block, progress is as activity continues to the point that the patient must stop walking. Resting including standing still for several minutes allowed resumption of the activity and the ability to walk a similar distance before stopping again. Uneven terrain and inclined shorten the distance. The pain has been progressive over the past several years.   The patient denies rest pain or dangling of an extremity off the side of the bed during the night for relief. No prior interventions or surgeries.  No history of back problems or DJD of the lumbar sacral spine.   The patient denies amaurosis fugax or recent TIA symptoms. There are no recent neurological changes noted. The patient denies history of DVT, PE or superficial thrombophlebitis. The patient denies recent episodes of angina or shortness of breath.   Patient had recent ABIs done.  The patient has a right ABI of 0.78 with a left of 0.76.  She has a right TBI 0.26 with a left of 0.11.  The patient has dampened  monophasic waveform seen bilaterally with reduced toe waveforms bilaterally.   Review of Systems  Cardiovascular:       Claudication  Skin: Positive for wound.  All other systems reviewed and are negative.      Objective:   Physical Exam Vitals reviewed.  HENT:     Head: Normocephalic.  Cardiovascular:     Rate and Rhythm: Normal rate.     Pulses:          Dorsalis pedis pulses are detected w/ Doppler on the right side and detected w/ Doppler on the left side.       Posterior tibial pulses are detected w/ Doppler on the right side and detected w/ Doppler on the left side.  Pulmonary:     Effort: Pulmonary effort is normal.  Feet:     Left foot:     Skin integrity: Ulcer present.  Neurological:     Mental Status: She is alert and oriented to person, place, and time.  Psychiatric:        Mood and Affect: Mood normal.        Behavior: Behavior normal.        Thought Content: Thought content normal.        Judgment: Judgment normal.     BP (!) 230/80 Comment: Right Arm Mannuel  Pulse 76   Ht 5\' 8"  (1.727 m)   Wt 161 lb (73 kg)   BMI 24.48 kg/m   Past Medical History:  Diagnosis Date  . CAD (coronary artery disease)    a. 04/2017 Cath: LM 80, LAD 80p, 35m, LCX 95ost, 69m, EF 45-50%; b. 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  . Diastolic dysfunction    a. 04/2017 Echo: EF 55-60%, no rwma, Gr1 DD, mildly dil LA.  Marland Kitchen Hyperlipidemia   . Hypertensive heart disease   . Insulin dependent diabetes mellitus     Social History   Socioeconomic History  . Marital status: Married    Spouse name: Not on file  . Number of children: Not on file  . Years of education: Not on file  . Highest education level: Not on file  Occupational History  . Not on file  Tobacco Use  . Smoking status: Never Smoker  . Smokeless tobacco: Never Used  Substance and Sexual Activity  . Alcohol use: No  . Drug use: No  . Sexual activity: Not on file  Other Topics Concern  . Not on  file  Social History Narrative  . Not on file   Social Determinants of Health   Financial Resource Strain: Not on file  Food Insecurity: Not on file  Transportation Needs: Not on file  Physical Activity: Not on file  Stress: Not on file  Social Connections: Not on file  Intimate Partner Violence: Not on file    Past Surgical History:  Procedure Laterality Date  . ABDOMINAL HYSTERECTOMY    . CORONARY ARTERY BYPASS GRAFT N/A 05/09/2017   Procedure: CORONARY ARTERY BYPASS GRAFTING (CABG) x 3 using left internal mammary artery and right greater saphenous vein harvested endoscopically;  Surgeon: Ivin Poot, MD;  Location: [facility];  Service: Open Heart Surgery;  Laterality: N/A;  . INTRAOPERATIVE TRANSESOPHAGEAL ECHOCARDIOGRAM N/A 05/09/2017   Procedure: INTRAOPERATIVE TRANSESOPHAGEAL ECHOCARDIOGRAM;  Surgeon: Ivin Poot, MD;  Location: [facility];  Service: Open Heart Surgery;  Laterality: N/A;  . IR RADIOLOGIST EVAL & MGMT  12/04/2020  . LEFT HEART CATH AND CORONARY ANGIOGRAPHY N/A 05/02/2017   Procedure: Left Heart Cath and Coronary Angiography;  Surgeon: Wellington Hampshire, MD;  Location: [facility];  Service: Cardiovascular;  Laterality: N/A;    Family History  Problem Relation Age of Onset  . Breast cancer Maternal Aunt     Allergies  Allergen Reactions  . Flexeril [Cyclobenzaprine] Hypertension    CBC Latest Ref Rng & Units 08/11/2020 06/25/2020 05/13/2017  WBC 3.4 - 10.8 x10E3/uL 5.5 4.7 10.2  Hemoglobin 11.1 - 15.9 g/dL 12.0 11.2(L) 10.3(L)  Hematocrit 34.0 - 46.6 % 37.3 33.7(L) 31.0(L)  Platelets 150 - 450 x10E3/uL 227 204 198      CMP     Component Value Date/Time   NA 136 08/19/2020 1515   NA 141 08/11/2020 1058   K 4.2 08/19/2020 1515   CL 97 (L) 08/19/2020 1515   CO2 26 08/19/2020 1515   GLUCOSE 282 (H) 08/19/2020 1515   BUN 17 08/19/2020 1515   BUN 17 08/11/2020 1058   CREATININE 0.79 08/19/2020 1515   CALCIUM 9.5 08/19/2020 1515   GFRNONAA  >60 08/19/2020 1515   GFRAA 74 08/11/2020 1058     VAS [place] ABI WITH/WO TBI  Result Date: 12/02/2020 LOWER EXTREMITY DOPPLER STUDY Indications: Peripheral artery disease. High Risk Factors: Hypertension, hyperlipidemia, Diabetes, no history of                    smoking, coronary artery disease. Other Factors: Physician noted decreased pulses, bilaterally. Patient denies any  claudication symptoms.  Vascular Interventions: CABG x 3. Performing Technologist: Wilkie Aye RVT  Examination Guidelines: A complete evaluation includes at minimum, Doppler waveform signals and systolic blood pressure reading at the level of bilateral brachial, anterior tibial, and posterior tibial arteries, when vessel segments are accessible. Bilateral testing is considered an integral part of a complete examination. Photoelectric Plethysmograph (PPG) waveforms and toe systolic pressure readings are included as required and additional duplex testing as needed. Limited examinations for reoccurring indications may be performed as noted.  ABI Findings: +---------+------------------+-----+-------------------+--------+ Right    Rt Pressure (mmHg)IndexWaveform           Comment  +---------+------------------+-----+-------------------+--------+ Brachial 213                                                +---------+------------------+-----+-------------------+--------+ ATA      167               0.78 dampened monophasic         +---------+------------------+-----+-------------------+--------+ PTA      148               0.69 dampened monophasic         +---------+------------------+-----+-------------------+--------+ PERO     156               0.73 dampened monophasic         +---------+------------------+-----+-------------------+--------+ Great Toe55                0.26 Abnormal                    +---------+------------------+-----+-------------------+--------+  +---------+------------------+-----+-------------------+-------+ Left     Lt Pressure (mmHg)IndexWaveform           Comment +---------+------------------+-----+-------------------+-------+ Brachial 211                                               +---------+------------------+-----+-------------------+-------+ ATA      161               0.76 dampened monophasic        +---------+------------------+-----+-------------------+-------+ PTA      130               0.61 dampened monophasic        +---------+------------------+-----+-------------------+-------+ PERO     177               0.83 dampened monophasic        +---------+------------------+-----+-------------------+-------+ Great Toe23                0.11 Abnormal                   +---------+------------------+-----+-------------------+-------+ +-------+-----------+-----------+------------+------------+ ABI/TBIToday's ABIToday's TBIPrevious ABIPrevious TBI +-------+-----------+-----------+------------+------------+ Right  0.78       026        0.71                     +-------+-----------+-----------+------------+------------+ Left   0.76       0.11       [place]                       +-------+-----------+-----------+------------+------------+  TOES Findings: +----------+--------+-------+ Right ToesWaveformComment +----------+--------+-------+ 1st Digit Abnormal        +----------+--------+-------+  2nd Digit Abnormal        +----------+--------+-------+ 3rd Digit Abnormal        +----------+--------+-------+ 4th Digit Abnormal        +----------+--------+-------+ 5th Digit Abnormal        +----------+--------+-------+  +---------+--------+-------+ Left ToesWaveformComment +---------+--------+-------+ 1st DigitAbnormal        +---------+--------+-------+ 2nd DigitAbnormal        +---------+--------+-------+ 3rd DigitAbnormal        +---------+--------+-------+ 4th  DigitAbnormal        +---------+--------+-------+ 5th DigitAbnormal        +---------+--------+-------+   Right ABIs appear essentially unchanged compared to prior study on 04/2017.  Summary: Right: Resting right ankle-brachial index indicates moderate right lower extremity arterial disease. The right toe-brachial index is abnormal. PPG tracings appear dampened. Left: Resting left ankle-brachial index indicates moderate left lower extremity arterial disease. The left toe-brachial index is abnormal. PPG tracings appear dampened.  *See table(s) above for measurements and observations.  Electronically signed by Ida Rogue MD on 12/02/2020 at 7:48:29 AM.    Final        Assessment & Plan:   1. PAD (peripheral artery disease) (HCC)  Recommend:  The patient has evidence of severe atherosclerotic changes of both lower extremities associated with ulceration and tissue loss of the foot.  This represents a limb threatening ischemia and places the patient at the risk for limb loss.  Patient should undergo angiography of the lower extremities with the hope for intervention for limb salvage.  The risks and benefits as well as the alternative therapies was discussed in detail with the patient.  All questions were answered.  Patient agrees to proceed with angiography.  The patient will follow up with me in the office after the procedure.    2. Mixed hyperlipidemia Continue statin as ordered and reviewed, no changes at this time   3. Abscess of left foot  this will continue to be followed by podiatry.  Is nearly healed at this point.  4. Essential hypertension Continue antihypertensive medications as already ordered, these medications have been reviewed and there are no changes at this time.    Current Outpatient Medications on File Prior to Visit  Medication Sig Dispense Refill  . aspirin EC 81 MG tablet Take 1 tablet (81 mg total) by mouth daily. 90 tablet 3  . atorvastatin (LIPITOR) 40 MG  tablet Take 1 tablet (40 mg total) by mouth daily. 90 tablet 3  . BD INSULIN SYRINGE U/F 30G X 1/2" 0.5 ML MISC     . benazepril (LOTENSIN) 40 MG tablet Take 1 tablet (40 mg total) by mouth daily. 90 tablet 2  . carvedilol (COREG) 3.125 MG tablet Take 1 tablet (3.125 mg total) by mouth 2 (two) times daily with a meal. 180 tablet 2  . Chlorpheniramine Maleate (ALLERGY PO) Take 1 tablet by mouth daily as needed (allergies).    . cholecalciferol (VITAMIN D) 1000 units tablet Take 2,000 Units by mouth daily at 12 noon.     . cloNIDine (CATAPRES) 0.2 MG tablet Take 1 tablet (0.2 mg total) by mouth 2 (two) times daily. 90 tablet 3  . clopidogrel (PLAVIX) 75 MG tablet TAKE 1 TABLET DAILY 90 tablet 3  . ferrous sulfate 325 (65 FE) MG EC tablet Take 1 tablet (325 mg total) by mouth daily at 2 PM. 90 tablet 3  . HUMALOG KWIKPEN 100 UNIT/ML KwikPen INJECT 38 UNITS UNDER THE SKIN DAILY 45 mL 2  . HUMALOG MIX 75/25  KWIKPEN (75-25) 100 UNIT/ML Kwikpen     . LANTUS 100 UNIT/ML injection INJECT 45 UNITS UNDER THE SKIN AT BEDTIME 40 mL 3  . levothyroxine (SYNTHROID) 100 MCG tablet Take 1 tablet (100 mcg total) by mouth daily before breakfast. 90 tablet 3  . Multiple Vitamin (MULTIVITAMIN) capsule Take 1 capsule by mouth daily at 12 noon.     . nitroGLYCERIN (NITROSTAT) 0.4 MG SL tablet Place 1 tablet (0.4 mg total) under the tongue every 5 (five) minutes as needed for chest pain. 90 tablet 3  . Polyethyl Glycol-Propyl Glycol (SYSTANE OP) Apply 1 drop to eye as needed (dry eyes).    . hydrochlorothiazide (MICROZIDE) 12.5 MG capsule Take 1 capsule (12.5 mg total) by mouth daily. 90 capsule 2   No current facility-administered medications on file prior to visit.    There are no Patient Instructions on file for this visit. No follow-ups on file.   Kris Hartmann, NP

## 2021-01-19 ENCOUNTER — Telehealth (INDEPENDENT_AMBULATORY_CARE_PROVIDER_SITE_OTHER): Payer: Self-pay

## 2021-01-19 NOTE — Telephone Encounter (Signed)
Patient is having trouble with scheduling her LLE angio with Dr. Lucky Cowboy because of transportation issues. Patient was offered any Monday and Thursday in April but was not able to do any dates. I gave the patient the number for [facility] transportation and the patient stated she would call them and then call me back to get scheduled.

## 2021-01-20 ENCOUNTER — Encounter: Payer: Self-pay | Admitting: Podiatry

## 2021-01-20 ENCOUNTER — Ambulatory Visit: Payer: Medicare Other | Admitting: Podiatry

## 2021-01-20 ENCOUNTER — Telehealth (HOSPITAL_COMMUNITY): Payer: Self-pay | Admitting: Radiology

## 2021-01-20 ENCOUNTER — Other Ambulatory Visit (HOSPITAL_COMMUNITY): Payer: Self-pay | Admitting: Interventional Radiology

## 2021-01-20 ENCOUNTER — Other Ambulatory Visit: Payer: Self-pay

## 2021-01-20 DIAGNOSIS — I739 Peripheral vascular disease, unspecified: Secondary | ICD-10-CM | POA: Diagnosis not present

## 2021-01-20 DIAGNOSIS — Z794 Long term (current) use of insulin: Secondary | ICD-10-CM

## 2021-01-20 DIAGNOSIS — E119 Type 2 diabetes mellitus without complications: Secondary | ICD-10-CM

## 2021-01-20 DIAGNOSIS — I999 Unspecified disorder of circulatory system: Secondary | ICD-10-CM

## 2021-01-20 DIAGNOSIS — S81802A Unspecified open wound, left lower leg, initial encounter: Secondary | ICD-10-CM

## 2021-01-20 DIAGNOSIS — L97522 Non-pressure chronic ulcer of other part of left foot with fat layer exposed: Secondary | ICD-10-CM | POA: Diagnosis not present

## 2021-01-20 NOTE — Telephone Encounter (Signed)
Called pt, left VM for her to call to schedule L lower leg angio with Dr. Laurence Ferrari on 01/30/21. JM

## 2021-01-21 ENCOUNTER — Encounter: Payer: Self-pay | Admitting: Podiatry

## 2021-01-21 NOTE — Progress Notes (Signed)
Subjective:  Patient ID: Carrie Kane, female    DOB: March 30, 1941,  MRN: [id number]  Chief Complaint  Patient presents with  . Wound Check    "its doing better.  Its like a hard callous now"    80 y.o. female presents for wound care.  Patient presents with follow-up of left submetatarsal 4 ulceration.  Patient states she is doing a lot better and may have healed.  She is scheduled to undergo angiogram as well in the near future.  She denies any other acute complaints.  No pain.   Review of Systems: Negative except as noted in the HPI. Denies N/V/F/Ch.  Past Medical History:  Diagnosis Date  . CAD (coronary artery disease)    a. 04/2017 Cath: LM 80, LAD 80p, 22m, LCX 95ost, 75m, EF 45-50%; b. 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  . Diastolic dysfunction    a. 04/2017 Echo: EF 55-60%, no rwma, Gr1 DD, mildly dil LA.  Marland Kitchen Hyperlipidemia   . Hypertensive heart disease   . Insulin dependent diabetes mellitus     Current Outpatient Medications:  .  aspirin EC 81 MG tablet, Take 1 tablet (81 mg total) by mouth daily., Disp: 90 tablet, Rfl: 3 .  atorvastatin (LIPITOR) 40 MG tablet, Take 1 tablet (40 mg total) by mouth daily., Disp: 90 tablet, Rfl: 3 .  BD INSULIN SYRINGE U/F 30G X 1/2" 0.5 ML MISC, , Disp: , Rfl:  .  benazepril (LOTENSIN) 40 MG tablet, Take 1 tablet (40 mg total) by mouth daily., Disp: 90 tablet, Rfl: 2 .  carvedilol (COREG) 3.125 MG tablet, Take 1 tablet (3.125 mg total) by mouth 2 (two) times daily with a meal., Disp: 180 tablet, Rfl: 2 .  Chlorpheniramine Maleate (ALLERGY PO), Take 1 tablet by mouth daily as needed (allergies)., Disp: , Rfl:  .  cholecalciferol (VITAMIN D) 1000 units tablet, Take 2,000 Units by mouth daily at 12 noon. , Disp: , Rfl:  .  cloNIDine (CATAPRES) 0.2 MG tablet, Take 1 tablet (0.2 mg total) by mouth 2 (two) times daily., Disp: 90 tablet, Rfl: 3 .  clopidogrel (PLAVIX) 75 MG tablet, TAKE 1 TABLET DAILY, Disp: 90 tablet, Rfl: 3 .   ferrous sulfate 325 (65 FE) MG EC tablet, Take 1 tablet (325 mg total) by mouth daily at 2 PM., Disp: 90 tablet, Rfl: 3 .  HUMALOG KWIKPEN 100 UNIT/ML KwikPen, INJECT 38 UNITS UNDER THE SKIN DAILY, Disp: 45 mL, Rfl: 2 .  HUMALOG MIX 75/25 KWIKPEN (75-25) 100 UNIT/ML Kwikpen, , Disp: , Rfl:  .  hydrochlorothiazide (MICROZIDE) 12.5 MG capsule, Take 1 capsule (12.5 mg total) by mouth daily., Disp: 90 capsule, Rfl: 2 .  LANTUS 100 UNIT/ML injection, INJECT 45 UNITS UNDER THE SKIN AT BEDTIME, Disp: 40 mL, Rfl: 3 .  levothyroxine (SYNTHROID) 100 MCG tablet, Take 1 tablet (100 mcg total) by mouth daily before breakfast., Disp: 90 tablet, Rfl: 3 .  Multiple Vitamin (MULTIVITAMIN) capsule, Take 1 capsule by mouth daily at 12 noon. , Disp: , Rfl:  .  nitroGLYCERIN (NITROSTAT) 0.4 MG SL tablet, Place 1 tablet (0.4 mg total) under the tongue every 5 (five) minutes as needed for chest pain., Disp: 90 tablet, Rfl: 3 .  Polyethyl Glycol-Propyl Glycol (SYSTANE OP), Apply 1 drop to eye as needed (dry eyes)., Disp: , Rfl:   Social History   Tobacco Use  Smoking Status Never Smoker  Smokeless Tobacco Never Used    Allergies  Allergen Reactions  . Flexeril [  Cyclobenzaprine] Hypertension   Objective:  There were no vitals filed for this visit. There is no height or weight on file to calculate BMI. Constitutional Well developed. Well nourished.  Vascular Dorsalis pedis pulses faintly palpable bilaterally. Posterior tibial pulses  Faintly palpable bilaterally. Capillary refill normal to all digits.  No cyanosis or clubbing noted. Pedal hair growth normal.  Neurologic Normal speech. Oriented to person, place, and time. Protective sensation absent  Dermatologic Wound Location: Left submetatarsal 4 ulceration completely epithelialized.  Does not probe to bone.  No breakdown of the skin noted.  No clinical signs of infection noted.  Orthopedic: No pain to palpation either foot.   Radiographs:  None Assessment:   1. PVD (peripheral vascular disease) (HCC)   2. Vascular abnormality   3. Diabetes mellitus type 2, insulin dependent ([place])   4. Ulcer of left foot with fat layer exposed ([place])    Plan:  Patient was evaluated and treated and all questions answered.  Ulcer left submetatarsal 4 ulceration limited to the breakdown of the skin -Ulceration has completely reepithelialized.  I discussed with her the importance of offloading and managing and preventing future ulceration.  Ultimately she will benefit from diabetic shoes.  I also believe she will benefit from floating osteotomy in the future if he continues to have ulceration.  Patient states understanding.. -ABIs PVRs were reviewed with the patient which showed vascular compromise to both lower extremity including the left side.  I discussed this with the patient.  I encouraged her to do regardless go undergo an angiogram to help with the blood flow and therefore decrease the chance of recurrence.  Patient states understanding -Patient is scheduled to undergo angiogram with [facility]. No follow-ups on file.

## 2021-01-29 ENCOUNTER — Telehealth: Payer: Self-pay | Admitting: Internal Medicine

## 2021-01-29 NOTE — Telephone Encounter (Signed)
   Carrie Kane DOB: [birth year] MRN: [id number]   RIDER WAIVER AND RELEASE OF LIABILITY  For purposes of improving physical access to our facilities, Carrie Kane is pleased to partner with third parties to provide Carrie Kane patients or other authorized individuals the option of convenient, on-demand ground transportation Kane (the Carrie Kane") through use of the technology service that enables users to request on-demand ground transportation from independent third-party providers.  By opting to use and accept these Carrie Kane, I, the undersigned, hereby agree on behalf of myself, and on behalf of any minor child using the Carrie Kane for whom I am the parent or legal guardian, as follows:  1. Carrie Kane provided to me are provided by independent third-party transportation providers who are not Carrie Kane or employees and who are unaffiliated with Carrie Kane. 2. Carrie Kane is neither a transportation carrier nor a common or public carrier. 3. Carrie Kane has no control over the quality or safety of the transportation that occurs as a result of the Carrie Kane. 4. Carrie Kane cannot guarantee that any third-party transportation provider will complete any arranged transportation service. 5. Carrie Kane makes no representation, warranty, or guarantee regarding the reliability, timeliness, quality, safety, suitability, or availability of any of the Carrie Kane or that they will be error free. 6. I fully understand that traveling by vehicle involves risks and dangers of serious bodily injury, including permanent disability, paralysis, and death. I agree, on behalf of myself and on behalf of any minor child using the Carrie Kane for whom I am the parent or legal guardian, that the entire risk arising out of my use of the Carrie Kane remains solely with me, to the maximum extent permitted under applicable law. 7. The Carrie Kane are provided "as is" and "as available." Carrie Kane disclaims all representations and warranties, express, implied or statutory, not expressly set out in these terms, including the implied warranties of merchantability and fitness for a particular purpose. 8. I hereby waive and release Carrie Kane, its agents, employees, officers, directors, representatives, insurers, attorneys, assigns, successors, subsidiaries, and affiliates from any and all past, present, or future claims, demands, liabilities, actions, causes of action, or suits of any kind directly or indirectly arising from acceptance and use of the Carrie Kane. 9. I further waive and release Carrie Kane and its affiliates from all present and future liability and responsibility for any injury or death to persons or damages to property caused by or related to the use of the Carrie Kane. 10. I have read this Waiver and Release of Liability, and I understand the terms used in it and their legal significance. This Waiver is freely and voluntarily given with the understanding that my right (as well as the right of any minor child for whom I am the parent or legal guardian using the Carrie Kane) to legal recourse against [place] in connection with the Carrie Kane is knowingly surrendered in return for use of these Kane.   I attest that I read the consent document to Carrie Kane, gave Carrie Kane the opportunity to ask questions and answered the questions asked (if any). I affirm that Carrie Kane then provided consent for she's participation in this program.     Carrie Kane

## 2021-01-30 ENCOUNTER — Telehealth (HOSPITAL_COMMUNITY): Payer: Self-pay

## 2021-01-30 NOTE — Telephone Encounter (Signed)
Called to schedule LLE angio. Pt stated that she already has the procedure scheduled at [facility] on 4/25. This was more convenient for her since she already lives in [place]. AW

## 2021-02-05 ENCOUNTER — Other Ambulatory Visit: Payer: Self-pay

## 2021-02-05 ENCOUNTER — Other Ambulatory Visit
Admission: RE | Admit: 2021-02-05 | Discharge: 2021-02-05 | Disposition: A | Discharge status: Home / Self Care | Payer: Medicare Other | Source: Ambulatory Visit | Attending: Vascular Surgery | Admitting: Vascular Surgery

## 2021-02-05 DIAGNOSIS — Z20822 Contact with and (suspected) exposure to covid-19: Secondary | ICD-10-CM | POA: Insufficient documentation

## 2021-02-05 DIAGNOSIS — Z01812 Encounter for preprocedural laboratory examination: Secondary | ICD-10-CM | POA: Diagnosis present

## 2021-02-06 LAB — SARS CORONAVIRUS 2 (TAT 6-24 HRS): SARS Coronavirus 2: NEGATIVE

## 2021-02-09 ENCOUNTER — Ambulatory Visit
Admission: RE | Admit: 2021-02-09 | Discharge: 2021-02-09 | Disposition: A | Discharge status: Home / Self Care | Payer: Medicare Other | Attending: Vascular Surgery | Admitting: Vascular Surgery

## 2021-02-09 ENCOUNTER — Other Ambulatory Visit (INDEPENDENT_AMBULATORY_CARE_PROVIDER_SITE_OTHER): Payer: Self-pay | Admitting: Nurse Practitioner

## 2021-02-09 ENCOUNTER — Encounter: Payer: Self-pay | Admitting: Vascular Surgery

## 2021-02-09 ENCOUNTER — Other Ambulatory Visit: Payer: Self-pay

## 2021-02-09 ENCOUNTER — Encounter
Admission: RE | Disposition: A | Discharge status: Home / Self Care | Payer: Self-pay | Source: Home / Self Care | Attending: Vascular Surgery

## 2021-02-09 DIAGNOSIS — E1151 Type 2 diabetes mellitus with diabetic peripheral angiopathy without gangrene: Secondary | ICD-10-CM | POA: Insufficient documentation

## 2021-02-09 DIAGNOSIS — E11621 Type 2 diabetes mellitus with foot ulcer: Secondary | ICD-10-CM | POA: Insufficient documentation

## 2021-02-09 DIAGNOSIS — I70219 Atherosclerosis of native arteries of extremities with intermittent claudication, unspecified extremity: Secondary | ICD-10-CM

## 2021-02-09 DIAGNOSIS — Z7902 Long term (current) use of antithrombotics/antiplatelets: Secondary | ICD-10-CM | POA: Insufficient documentation

## 2021-02-09 DIAGNOSIS — L02612 Cutaneous abscess of left foot: Secondary | ICD-10-CM | POA: Insufficient documentation

## 2021-02-09 DIAGNOSIS — I739 Peripheral vascular disease, unspecified: Secondary | ICD-10-CM | POA: Diagnosis not present

## 2021-02-09 DIAGNOSIS — Z79899 Other long term (current) drug therapy: Secondary | ICD-10-CM | POA: Insufficient documentation

## 2021-02-09 DIAGNOSIS — Z79890 Hormone replacement therapy (postmenopausal): Secondary | ICD-10-CM | POA: Diagnosis not present

## 2021-02-09 DIAGNOSIS — E782 Mixed hyperlipidemia: Secondary | ICD-10-CM | POA: Insufficient documentation

## 2021-02-09 DIAGNOSIS — Z7982 Long term (current) use of aspirin: Secondary | ICD-10-CM | POA: Insufficient documentation

## 2021-02-09 DIAGNOSIS — Z794 Long term (current) use of insulin: Secondary | ICD-10-CM | POA: Diagnosis not present

## 2021-02-09 DIAGNOSIS — I10 Essential (primary) hypertension: Secondary | ICD-10-CM | POA: Diagnosis not present

## 2021-02-09 DIAGNOSIS — I70245 Atherosclerosis of native arteries of left leg with ulceration of other part of foot: Secondary | ICD-10-CM | POA: Insufficient documentation

## 2021-02-09 DIAGNOSIS — L97529 Non-pressure chronic ulcer of other part of left foot with unspecified severity: Secondary | ICD-10-CM | POA: Diagnosis not present

## 2021-02-09 HISTORY — PX: LOWER EXTREMITY ANGIOGRAPHY: CATH118251

## 2021-02-09 LAB — BUN: BUN: 21 mg/dL (ref 8–23)

## 2021-02-09 LAB — GLUCOSE, CAPILLARY
Glucose-Capillary: 115 mg/dL — ABNORMAL HIGH (ref 70–99)
Glucose-Capillary: 146 mg/dL — ABNORMAL HIGH (ref 70–99)

## 2021-02-09 LAB — CREATININE, SERUM
Creatinine, Ser: 0.94 mg/dL (ref 0.44–1.00)
GFR, Estimated: >60 mL/min (ref >60)

## 2021-02-09 SURGERY — LOWER EXTREMITY ANGIOGRAPHY
Anesthesia: Moderate Sedation | Site: Leg Lower | Laterality: Left

## 2021-02-09 MED ORDER — MIDAZOLAM HCL 5 MG/5ML IJ SOLN
INTRAMUSCULAR | Status: AC
  Filled 2021-02-09: qty 5

## 2021-02-09 MED ORDER — LIDOCAINE-EPINEPHRINE (PF) 2 %-1:200000 IJ SOLN
INTRAMUSCULAR | Status: AC
  Filled 2021-02-09: qty 20

## 2021-02-09 MED ORDER — HEPARIN SODIUM (PORCINE) 1000 UNIT/ML IJ SOLN
INTRAMUSCULAR | Status: DC | PRN
  Administered 2021-02-09: 5000 [IU] via INTRAVENOUS

## 2021-02-09 MED ORDER — NITROGLYCERIN 1 MG/10 ML FOR IR/CATH LAB
INTRA_ARTERIAL | Status: AC
  Filled 2021-02-09: qty 10

## 2021-02-09 MED ORDER — FENTANYL CITRATE (PF) 100 MCG/2ML IJ SOLN
INTRAMUSCULAR | Status: DC | PRN
  Administered 2021-02-09: 25 ug via INTRAVENOUS
  Administered 2021-02-09: 50 ug via INTRAVENOUS

## 2021-02-09 MED ORDER — CEFAZOLIN SODIUM-DEXTROSE 2-4 GM/100ML-% IV SOLN
2.0000 g | Freq: Once | INTRAVENOUS | Status: AC
  Administered 2021-02-09: 2 g via INTRAVENOUS

## 2021-02-09 MED ORDER — ONDANSETRON HCL 4 MG/2ML IJ SOLN: 4.0000 mg | Freq: Four times a day (QID) | INTRAMUSCULAR | Status: DC | PRN

## 2021-02-09 MED ORDER — HYDRALAZINE HCL 20 MG/ML IJ SOLN
INTRAMUSCULAR | Status: AC
  Filled 2021-02-09: qty 1

## 2021-02-09 MED ORDER — MIDAZOLAM HCL 2 MG/2ML IJ SOLN
INTRAMUSCULAR | Status: DC | PRN
  Administered 2021-02-09: 1 mg via INTRAVENOUS
  Administered 2021-02-09: 2 mg via INTRAVENOUS

## 2021-02-09 MED ORDER — NITROGLYCERIN 1 MG/10 ML FOR IR/CATH LAB
INTRA_ARTERIAL | Status: DC | PRN
  Administered 2021-02-09: 300 ug

## 2021-02-09 MED ORDER — HEPARIN SODIUM (PORCINE) 1000 UNIT/ML IJ SOLN
INTRAMUSCULAR | Status: AC
  Filled 2021-02-09: qty 1

## 2021-02-09 MED ORDER — CEFAZOLIN SODIUM-DEXTROSE 2-4 GM/100ML-% IV SOLN
INTRAVENOUS | Status: AC
  Filled 2021-02-09: qty 100

## 2021-02-09 MED ORDER — SODIUM CHLORIDE 0.9 % IV SOLN: INTRAVENOUS | Status: DC

## 2021-02-09 MED ORDER — HYDROMORPHONE HCL 1 MG/ML IJ SOLN: 1.0000 mg | Freq: Once | INTRAMUSCULAR | Status: DC | PRN

## 2021-02-09 MED ORDER — HYDRALAZINE HCL 20 MG/ML IJ SOLN
INTRAMUSCULAR | Status: DC | PRN
  Administered 2021-02-09: 10 mg via INTRAVENOUS

## 2021-02-09 MED ORDER — METHYLPREDNISOLONE SODIUM SUCC 125 MG IJ SOLR: 125.0000 mg | Freq: Once | INTRAMUSCULAR | Status: DC | PRN

## 2021-02-09 MED ORDER — FAMOTIDINE 20 MG PO TABS: 40.0000 mg | ORAL_TABLET | Freq: Once | ORAL | Status: DC | PRN

## 2021-02-09 MED ORDER — FENTANYL CITRATE (PF) 100 MCG/2ML IJ SOLN
INTRAMUSCULAR | Status: AC
  Filled 2021-02-09: qty 2

## 2021-02-09 MED ORDER — DIPHENHYDRAMINE HCL 50 MG/ML IJ SOLN: 50.0000 mg | Freq: Once | INTRAMUSCULAR | Status: DC | PRN

## 2021-02-09 MED ORDER — MIDAZOLAM HCL 2 MG/ML PO SYRP: 8.0000 mg | ORAL_SOLUTION | Freq: Once | ORAL | Status: DC | PRN

## 2021-02-09 SURGICAL SUPPLY — 28 items
BALLN STERLING OTW 2X150X150 (BALLOONS) ×2
BALLN ULTRASCOR 014 2.5X40X150 (BALLOONS) ×2
BALLN ULTRVRSE 018 2.5X150X150 (BALLOONS) ×2
BALLN ULTRVRSE 2X150X150 (BALLOONS) ×2
BALLN ULTRVRSE 2X300X150 (BALLOONS) ×1
BALLN ULTRVRSE 2X300X150 OTW (BALLOONS) ×1
BALLN ULTRVRSE 3X40X150 (BALLOONS) ×2
BALLOON STERLING OTW 2X150X150 (BALLOONS) ×1 IMPLANT
BALLOON ULTRSCR 014 2.5X40X150 (BALLOONS) ×1 IMPLANT
BALLOON ULTRVRSE 2X150X150 (BALLOONS) ×1 IMPLANT
BALLOON ULTRVRSE 2X300X150 OTW (BALLOONS) ×1 IMPLANT
BALLOON ULTRVRSE 3X40X150 (BALLOONS) ×1 IMPLANT
BALLOON ULTRVS 018 2.5X150X150 (BALLOONS) ×1 IMPLANT
CATH 0.018 NAVICROSS ST 150 (CATHETERS) ×2 IMPLANT
CATH ANGIO 5F PIGTAIL 65CM (CATHETERS) ×2 IMPLANT
CATH BEACON 5 .038 100 VERT TP (CATHETERS) ×2 IMPLANT
COVER PROBE U/S 5X48 (MISCELLANEOUS) ×2 IMPLANT
DEVICE STARCLOSE SE CLOSURE (Vascular Products) ×2 IMPLANT
GLIDEWIRE ADV .014X300CM (WIRE) ×2 IMPLANT
GLIDEWIRE ADV .035X260CM (WIRE) ×2 IMPLANT
KIT ENCORE 26 ADVANTAGE (KITS) ×2 IMPLANT
PACK ANGIOGRAPHY (CUSTOM PROCEDURE TRAY) ×2 IMPLANT
SHEATH ANL2 6FRX45 HC (SHEATH) ×2 IMPLANT
SHEATH BRITE TIP 5FRX11 (SHEATH) ×2 IMPLANT
SYR MEDRAD MARK 7 150ML (SYRINGE) ×2 IMPLANT
TUBING CONTRAST HIGH PRESS 48 (TUBING) ×4 IMPLANT
WIRE G V18X300CM (WIRE) ×2 IMPLANT
WIRE GUIDERIGHT .035X150 (WIRE) ×2 IMPLANT

## 2021-02-09 NOTE — Op Note (Signed)
[facility]  Percutaneous Study/Intervention Procedural Note   Date of Surgery: 02/09/2021  Surgeon(s):Nikiesha Milford    Assistants:none  Pre-operative Diagnosis: PAD with ulceration left lower extremity  Post-operative diagnosis:  Same  Procedure(s) Performed:             1.  Ultrasound guidance for vascular access right femoral artery             2.  Catheter placement into left anterior tibial artery from right femoral             3.  Aortogram and selective left lower extremity angiogram including selective images of the left anterior tibial artery.             4.  Percutaneous transluminal angioplasty of left anterior tibial artery with 2 mm diameter angioplasty balloon throughout, 2-1/2 and 3 mm diameter angioplasty balloon proximally             5.  StarClose closure device right femoral artery  EBL: 10 cc  Contrast: 60 cc  Fluoro Time: 12.9 minutes  Moderate Conscious Sedation Time: approximately 64 minutes using 3 mg of Versed and 75 mcg of Fentanyl              Indications:  Patient is a 80 y.o.female with severe peripheral arterial disease and nonhealing ulcerations. The patient has noninvasive study showing markedly reduced flow bilaterally, left worse than right. The patient is brought in for angiography for further evaluation and potential treatment.  Due to the limb threatening nature of the situation, angiogram was performed for attempted limb salvage. The patient is aware that if the procedure fails, amputation would be expected.  The patient also understands that even with successful revascularization, amputation may still be required due to the severity of the situation.  Risks and benefits are discussed and informed consent is obtained.   Procedure:  The patient was identified and appropriate procedural time out was performed.  The patient was then placed supine on the table and prepped and draped in the usual sterile fashion. Moderate conscious  sedation was administered during a face to face encounter with the patient throughout the procedure with my supervision of the RN administering medicines and monitoring the patient's vital signs, pulse oximetry, telemetry and mental status throughout from the start of the procedure until the patient was taken to the recovery room. Ultrasound was used to evaluate the right common femoral artery.  It was patent .  A digital ultrasound image was acquired.  A Seldinger needle was used to access the right common femoral artery under direct ultrasound guidance and a permanent image was performed.  A 0.035 J wire was advanced without resistance and a 5Fr sheath was placed.  Pigtail catheter was placed into the aorta and an AP aortogram was performed. This demonstrated normal renal arteries and normal aorta and iliac segments without significant stenosis. I then crossed the aortic bifurcation and advanced to the left femoral head. Selective left lower extremity angiogram was then performed. This demonstrated fairly normal left common femoral artery, profunda femoris artery, and superficial femoral and popliteal arteries.  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  There was a fairly normal tibial trifurcation with heavily diseased and multiple segments of occlusion and a small anterior tibial artery which was continuous to the foot.  The peroneal artery had a moderate to long segment occlusion from the proximal segment but did reconstitute through collaterals in the mid to distal segment.  The posterior tibial artery  occluded in the proximal segment and did not reconstitute distally.  The tibial vessels were seen better with a catheter placement more distal, and advanced a Kumpe catheter into the proximal anterior tibial artery to opacify distally to see that it was continuous to the foot prior to intervention. It was felt that it was in the patient's best interest to proceed with intervention after these images to avoid a second  procedure and a larger amount of contrast and fluoroscopy based off of the findings from the initial angiogram. The patient was systemically heparinized and a 6 [place] [employer] sheath was then placed over the [employer] wire. I then used a Kumpe catheter and the advantage wire to navigate through the disease in the anterior tibial artery and get down in the mid to distal anterior tibial artery where I exchanged for a V 18 wire and later a 014 advantage wire.  I then proceeded with intervention.  The anterior tibial artery was treated with a 2 mm diameter by 30 cm length angioplasty balloon down to the level of the ankle.  I could not get even an 014 balloon to advance into the foot or I would have treated that as well.  This was inflated to 14 atm for 1 minute.  There was a recalcitrant lesion in the proximal anterior tibial artery that I used a 2.5 mm diameter conventional, 2.5 mm diameter ultra score, and eventually a 3 mm diameter angioplasty balloon all inflated to near burst inflations.  Completion imaging showed markedly improved flow in the anterior tibial artery with less than 20% residual stenosis in the areas treated although the vessel still very small in the foot.  I then used the Kumpe and then a Ferd Hibbs cross catheter and the 0.014 advantage wire and V 18 wire to try to cross the peroneal artery occlusion but after several minutes it was clear this was not going to be a successful endeavor. I elected to terminate the procedure. The sheath was removed and StarClose closure device was deployed in the right femoral artery with excellent hemostatic result. The patient was taken to the recovery room in stable condition having tolerated the procedure well.  Findings:               Aortogram:  Normal renal arteries, normal aorta and iliac arteries without significant stenosis             Left lower Extremity:  Fairly normal left common femoral artery, profunda femoris artery, and superficial femoral and  popliteal arteries.  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  There was a fairly normal tibial trifurcation with heavily diseased and multiple segments of occlusion and a small anterior tibial artery which was continuous to the foot.  The peroneal artery had a moderate to long segment occlusion from the proximal segment but did reconstitute through collaterals in the mid to distal segment.  The posterior tibial artery occluded in the proximal segment and did not reconstitute distally.  The tibial vessels were seen better with a catheter placement more distal, and advanced a Kumpe catheter into the proximal anterior tibial artery to opacify distally to see that it was continuous to the foot prior to intervention.   Disposition: Patient was taken to the recovery room in stable condition having tolerated the procedure well.  Complications: None  Leotis Pain 02/09/2021 10:09 AM   This note was created with Dragon Medical transcription system. Any errors in dictation are purely unintentional.

## 2021-02-09 NOTE — Interval H&P Note (Signed)
History and Physical Interval Note:  02/09/2021 8:07 AM  Carrie Kane  has presented today for surgery, with the diagnosis of LT lower extremity angio   BARD   ASO w claudication Covid April 21.  The various methods of treatment have been discussed with the patient and family. After consideration of risks, benefits and other options for treatment, the patient has consented to  Procedure(s): LOWER EXTREMITY ANGIOGRAPHY (Left) as a surgical intervention.  The patient's history has been reviewed, patient examined, no change in status, stable for surgery.  I have reviewed the patient's chart and labs.  Questions were answered to the patient's satisfaction.     Leotis Pain

## 2021-02-11 NOTE — Progress Notes (Signed)
Office Visit    Patient Name: Carrie Kane Date of Encounter: 02/14/2021  Primary Care Provider:  Corky DownsMasoud, Javed, MD Primary Cardiologist:  Julien Nordmannimothy Gollan, MD  Chief Complaint    Chief Complaint  Patient presents with  . Follow-up    6 Months follow up and per patient her BP has been going up and down lately. Medications verbally reviewed with patient.     80 yo female with history of CAD s/p CABG x 3 (LIMA to LAD, SVG to Diag, SVG to OM - 2018), carotid stenosis, PAD, HTN, HLD, IDDM, HFpEF, and here today for 6 month follow-up.   Past Medical History    Past Medical History:  Diagnosis Date  . CAD (coronary artery disease)    a. 04/2017 Cath: LM 80, LAD 80p, 6781m, LCX 95ost, 173m, EF 45-50%; b. 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  . Diastolic dysfunction    a. 04/2017 Echo: EF 55-60%, no rwma, Gr1 DD, mildly dil LA.  Marland Kitchen. Hyperlipidemia   . Hypertensive heart disease   . Insulin dependent diabetes mellitus    Past Surgical History:  Procedure Laterality Date  . ABDOMINAL HYSTERECTOMY    . CORONARY ARTERY BYPASS GRAFT N/A 05/09/2017   Procedure: CORONARY ARTERY BYPASS GRAFTING (CABG) x 3 using left internal mammary artery and right greater saphenous vein harvested endoscopically;  Surgeon: [name], Peter, MD;  [facility];  Service: Open Heart Surgery;  Laterality: N/A;  . INTRAOPERATIVE TRANSESOPHAGEAL ECHOCARDIOGRAM N/A 05/09/2017   Procedure: INTRAOPERATIVE TRANSESOPHAGEAL ECHOCARDIOGRAM;  Surgeon: [name], Peter, MD;  [facility];  Service: Open Heart Surgery;  Laterality: N/A;  . IR RADIOLOGIST EVAL & MGMT  12/04/2020  . LEFT HEART CATH AND CORONARY ANGIOGRAPHY N/A 05/02/2017   Procedure: Left Heart Cath and Coronary Angiography;  Surgeon: [name], Muhammad A, MD;  Location: [facility] INVASIVE CV LAB;  Service: Cardiovascular;  Laterality: N/A;  . LOWER EXTREMITY ANGIOGRAPHY Left 02/09/2021   Procedure: LOWER EXTREMITY ANGIOGRAPHY;  Surgeon: [name], Jason S, MD;   Location: [facility] INVASIVE CV LAB;  Service: Cardiovascular;  Laterality: Left;    Allergies  Allergies  Allergen Reactions  . Flexeril [Cyclobenzaprine] Hypertension    History of Present Illness   / Carrie CrochetBetty Caggiano is a 80 y.o. female with PMH as above. She was seen in clinic in 2018 with CP and a, she bnormal ETT. She underwent diagnostic LHC 04/2017 that showed significant multivessel dz, including severe LM dz. She  underwent CABG x3 on 05/09/2017 (LIMA to LAD, SVG to Diag, SVG to OM).  She was seen in office 12/25/19 by her primary cardiologist, Dr [name]. At that time, she reported that she was active taking care of the house. She had occasional palpitations, sometimes for 3-4 days in a row. She was taking metoprolol on a PRN basis.   Seen 07/2020 and BP 168/80, though she did not take her metoprolol that morning. She reported only taking her BB when at home and would avoid it if she knew show would be driving or running errands.  She reported dizziness on her metoprolol and worried that she may fall.  That morning, she took all of her medications at 9 AM, except for her metoprolol.  Home BP reported as SBP 132-136 and DBP 72-74.  On review of EMR, she was on carvedilol 6.25 mg twice daily in the past, but she was uncertain why it was discontinued.  She was transitioned from metoprolol to Coreg, which she tolerated very well  and without further dizziness. Benazepril - HCTZ was discontinued, so that benazepril could be increased to benazepril 40mg  daily. She was then continued on a separate HCTZ 12.5mg  daily.   Today, 02/13/21, she RTC and BP noted to be elevated at 180/80. She reports taking all of her medications at 7:30AM. She usually takes her medications at 7:30AM and 6-7PM. She reports BP at home usually SBP 120-130 and DBP 60-70s. When her BP is high, she can hear it in her ears but denies any other associated sx. She denies chest pain, palpitations, dyspnea, pnd, orthopnea, n, v, dizziness,  syncope, weight gain, or early satiety. She reports edema associated with her recent LE vascular procedures but otherwise no edema. She urinates 4x per day and once at night and denies any dehydration as well as volume overload. She is tolerating Coreg very well without dizziness. She reports medication compliance. She states that her husband recently had an ear polyp removed, which has been an additional stressor, and she wonders if this is contributing to her elevated BP. She also notes that her BP was taken as soon as she sat down.  Clonidine 0.1mg  x1 administered today to ensure BP well controlled for pt drive home.    Home Medications    Current Outpatient Medications on File Prior to Visit  Medication Sig Dispense Refill  . aspirin EC 81 MG tablet Take 1 tablet (81 mg total) by mouth daily. 90 tablet 3  . atorvastatin (LIPITOR) 40 MG tablet Take 1 tablet (40 mg total) by mouth daily. 90 tablet 3  . BD INSULIN SYRINGE U/F 30G X 1/2" 0.5 ML MISC     . carvedilol (COREG) 3.125 MG tablet Take 1 tablet (3.125 mg total) by mouth 2 (two) times daily with a meal. 180 tablet 2  . Chlorpheniramine Maleate (ALLERGY PO) Take 1 tablet by mouth daily as needed (allergies).    . cholecalciferol (VITAMIN D) 1000 units tablet Take 2,000 Units by mouth daily at 12 noon.     . cloNIDine (CATAPRES) 0.2 MG tablet Take 1 tablet (0.2 mg total) by mouth 2 (two) times daily. 90 tablet 3  . clopidogrel (PLAVIX) 75 MG tablet TAKE 1 TABLET DAILY 90 tablet 3  . ferrous sulfate 325 (65 FE) MG EC tablet Take 1 tablet (325 mg total) by mouth daily at 2 PM. 90 tablet 3  . HUMALOG KWIKPEN 100 UNIT/ML KwikPen INJECT 38 UNITS UNDER THE SKIN DAILY 45 mL 2  . HUMALOG MIX 75/25 KWIKPEN (75-25) 100 UNIT/ML Kwikpen     . LANTUS 100 UNIT/ML injection INJECT 45 UNITS UNDER THE SKIN AT BEDTIME 40 mL 3  . levothyroxine (SYNTHROID) 100 MCG tablet Take 1 tablet (100 mcg total) by mouth daily before breakfast. 90 tablet 3  . Multiple  Vitamin (MULTIVITAMIN) capsule Take 1 capsule by mouth daily at 12 noon.     . nitroGLYCERIN (NITROSTAT) 0.4 MG SL tablet Place 1 tablet (0.4 mg total) under the tongue every 5 (five) minutes as needed for chest pain. 90 tablet 3  . Polyethyl Glycol-Propyl Glycol (SYSTANE OP) Apply 1 drop to eye as needed (dry eyes).    . hydrochlorothiazide (MICROZIDE) 12.5 MG capsule Take 1 capsule (12.5 mg total) by mouth daily. 90 capsule 2   No current facility-administered medications on file prior to visit.    Review of Systems    She denies chest pain, palpitations, dyspnea, pnd, orthopnea, n, v, syncope, edema, weight gain, or early satiety. She denies any dizziness.  She reports edema associated with recent vascular procedures. All other systems reviewed and are otherwise negative except as noted above.  Physical Exam    VS:  BP (!) 180/80 (BP Location: Left Arm, Patient Position: Sitting, Cuff Size: Normal)   Pulse 65   Ht 5\' 8"  (1.727 m)   Wt 164 lb (74.4 kg)   SpO2 99%   BMI 24.94 kg/m  , BMI Body mass index is 24.94 kg/m. GEN: Well nourished, well developed, in no acute distress. HEENT: normal. Neck: Supple, no JVD, carotid bruits, or masses. Cardiac: Bradycardic but regular, 1/6 systolic murmur. No rubs, or gallops. No clubbing, cyanosis, edema.  Radials/DP/PT 2+ and equal bilaterally.  Respiratory:  Respirations regular and unlabored, clear to auscultation bilaterally. GI: Soft, nontender, nondistended, BS + x 4. MS: no deformity or atrophy. Skin: warm and dry, no rash. Neuro:  Strength and sensation are intact. Psych: Normal affect.  Accessory Clinical Findings    ECG personally reviewed by me today -NSR, 65 bpm, possible left atrial enlargement, right bundle branch block, left anterior fascicular block, QRS 140 ms, QTC 44 ms  VITALS Reviewed today   Temp Readings from Last 3 Encounters:  02/09/21 98.3 F (36.8 C) (Oral)  06/25/20 97.6 F (36.4 C) (Tympanic)  04/05/20 98.8  F (37.1 C) (Oral)   BP Readings from Last 3 Encounters:  02/13/21 (!) 180/80  02/09/21 (!) 161/62  01/13/21 (!) 230/80   Pulse Readings from Last 3 Encounters:  02/13/21 65  02/09/21 71  01/13/21 76    Wt Readings from Last 3 Encounters:  02/13/21 164 lb (74.4 kg)  02/09/21 162 lb (73.5 kg)  01/13/21 161 lb (73 kg)     LABS  reviewed today   Lab Results  Component Value Date   WBC 5.5 08/11/2020   HGB 12.0 08/11/2020   HCT 37.3 08/11/2020   MCV 93 08/11/2020   PLT 227 08/11/2020   Lab Results  Component Value Date   CREATININE 0.94 02/09/2021   BUN 21 02/09/2021   NA 136 08/19/2020   K 4.2 08/19/2020   CL 97 (L) 08/19/2020   CO2 26 08/19/2020   No results found for: ALT, AST, GGT, ALKPHOS, BILITOT Lab Results  Component Value Date   CHOL 132 08/11/2020   HDL 68 08/11/2020   LDLCALC 49 08/11/2020   LDLDIRECT 52 08/11/2020   TRIG 79 08/11/2020   CHOLHDL 1.9 08/11/2020    Lab Results  Component Value Date   HGBA1C 8.4 (H) 05/03/2017   Lab Results  Component Value Date   TSH 0.372 05/03/2017     STUDIES/PROCEDURES reviewed today   Vas [place] LE arterial bilateral Summary:  Right: Atherosclerosis in the common femoral, femoral, popliteal and  tibial arteries.  Posterior tibial artery appears to be occluded in the mid and distal  protion.  Peroneal artery appears to be occluded in the mid portion and  reconstitutes distally.  Left: Atherosclerosis in the common femoral, femoral, popliteal and tibial  arteries.  Posterior tibial artery appears to be occluded in the mid and distal  protion.  Peroneal artery appears to be occluded in the distal portion.   ABIs/vascular 11/2020 Summary:  Right: Resting right ankle-brachial index indicates moderate right lower  extremity arterial disease.  The right toe-brachial index is abnormal. PPG tracings appear dampened.  Left: Resting left ankle-brachial index indicates moderate left lower  extremity arterial  disease.  The left toe-brachial index is abnormal. PPG tracings appear dampened.  Summary:  Right: Atherosclerosis in the common femoral, femoral, popliteal and  tibial arteries.  Posterior tibial artery appears to be occluded in the mid and distal  protion.  Peroneal artery appears to be occluded in the mid portion and  reconstitutes distally.  Left: Atherosclerosis in the common femoral, femoral, popliteal and tibial  arteries.  Posterior tibial artery appears to be occluded in the mid and distal  protion.  Peroneal artery appears to be occluded in the distal portion.   Echo 08/2020 1. Left ventricular ejection fraction, by estimation, is 55 to 60%. The  left ventricle has normal function. The left ventricle has no regional  wall motion abnormalities. Left ventricular diastolic parameters are  consistent with Grade II diastolic  dysfunction (pseudonormalization).  2. Right ventricular systolic function is normal. The right ventricular  size is mildly enlarged. There is normal pulmonary artery systolic  pressure. The estimated right ventricular systolic pressure is 33.2 mmHg.  3. Left atrial size was moderately dilated.  4. Right atrial size was mildly dilated.  5. The mitral valve is normal in structure. No evidence of mitral valve  regurgitation. No evidence of mitral stenosis. Moderate mitral annular  calcification.  6. Tricuspid valve regurgitation is moderate.  Echo TEE 05/09/2017 Left ventricle: Normal cavity size. Concentric hypertrophy of severe  severity. LV systolic function is low normal with an EF of 50-55%. There  are no obvious wall motion abnormalities.  No change post procedure, with normal LV function and no RWMA.   [place] Arms, legs, carotids 05/05/2017 - Bilateral - 1% to 39% ICA stenosis. Vertebral artey flow is  antegrade.  - Palmar arch evaluation - Doppler waveforms remained normal  bilaterally with both radial and ulnar compressions.  - ABIs  on the right indicate a moderate reduction in arterial flow  at rest. The left ABI could not be ascertained due to non  compressible arteries possible secondary to calcification.    Echo 05/03/2017 - Left ventricle: The cavity size was normal. Systolic function was  normal. The estimated ejection fraction was in the range of 55%  to 60%. Wall motion was normal; there were no regional wall  motion abnormalities. Doppler parameters are consistent with  abnormal left ventricular relaxation (grade 1 diastolic  dysfunction).  - Mitral valve: Moderately calcified annulus.  - Left atrium: The atrium was mildly dilated.  - Pulmonary arteries: Systolic pressure was mildly increased.  LHC 05/02/2017  LM lesion, 80 %stenosed.  Ost Cx to Prox Cx lesion, 95 %stenosed.  Mid Cx lesion, 70 %stenosed.  Mid LAD lesion, 30 %stenosed.  Prox LAD lesion, 80 %stenosed.  There is mild left ventricular systolic dysfunction.  LV end diastolic pressure is moderately elevated.  The left ventricular ejection fraction is 45-50% by visual estimate. 1. Severe heavily calcified calcified left main stenosis extending into the ostium of the left circumflex with significant proximal LAD stenosis. Diffuse diabetic branch disease. 2. Mildly reduced LV systolic function with an EF of 45-50% with moderate mid to distal anterior and apical hypokinesis. 3. Severely elevated systemic hypertension with moderately elevated left ventricular end-diastolic pressure. Recommendations: Given the patient's cath findings, severely elevated blood pressure with chest pain, recommend hospital admission for blood pressure control and revascularization. Recommend CABG. I switched labetalol to carvedilol. The patient is not truly allergic to aspirin but she reports previous retinal bleeding on antiplatelet medications. I'm going to start small dose aspirin 81 mg once daily as I feel that the benefits outweigh the risks at  this time.  Assessment & Plan   CAD s/p CABG  --No concerning symptoms for angina.  She has not needed her nitro.  Continue medical management with ASA, BB, and statin. Continue lifestyle changes and aggressive risk factor control.   Chronic HFpEF -No s/sx of volume overload.  Euvolemic and well compensated on exam.  Most recent echo above reviewed with EF bl and G2DD, as well as RVSP 30.1mmHg, mild RVE/RAE, and moderate LAE. Sleep study politely declined in past. Recommendation today with pt for BP control with goal BP 130/80 or lower. Reviewed possible medication changes with pt agreement to transition from benazepril to valsartan at this time to see if better BP control with this medication change. CCB deferred due to possible side effect of LEE, given recent edema 2/2 LE by [facility]. Continue ARB, BB, and HCTZ..  Essential hypertension, goal BP <130/80 -BP suboptimal at 180/80.  We will transition from benazepril to valsartan 160mg  qd to be escalated in the next 1-2 weeks to valsartan 320mg  daily if well tolerated. Ideally, we will wean her off of clonidine in the future, as this is known to cause rebound hypertension, and as discussed with pt today. Given elevated BP during clinic, however, we did administer clonidine 0.1mg  x1 today for additional BP support for the drive home. Reviewed low salt diet and total intake under 2L daily with pt understanding. Continue to monitor BP at home.  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 Continue current medications as above and with change to valsartan as above. Periodic echo to monitor.    Palpitations -She denies any significant palpitations.  Previously prescribed metoprolol for her palpitations, which caused her dizziness, and so we changed her carvedilol 3.125 mg twice daily, which she is tolerating well. Continue Coreg.  Bilateral carotid disease PAD with intermittent claudication -->01/2021 LLE angiography and intervention per [facility]/  plan for RLE angiography 02/2021 --History of LE arterial dz and bilateral carotid dz. No reported s/sx of worsening carotid dz today. Does report edema 2/2 recent LLE angiography. Recent 01/2021 LLE angiography and percutaneous transluminal angioplasty of L anterior tibial artery per [facility] with plan for RLE angiography in May 2022 for intermittent claudication and recent arterial studies as above. In the meantime, recommend ongoing risk factor modification. Continue current ASA and Plavix.  Continue cholesterol control with atorvastatin 40mg  qd. Further recommendations per [facility].  HLD, goal below 70 -Continue current atorvastatin 40 mg.  She follows with her PCP for updated lipids each year.  LDL goal below 70 recommended.  Most recent LDL 49 and well controlled.   IDDM -Continue to monitor glycemic levels for risk factor modification.  Per PCP.  Ongoing lifestyle and diet changes recommended.  Medication changes: Stop benazepril. Start valsartan 160mg  qd and escalate to valsartan 320mg  if well tolerated with plan to then discontinue clonidine in the future. My hope is that valsartan will offer better BP control than benazepril. For now, will reduce to using clonidine PRN for SBP 150 or higher but otherwise avoid use. If further BP support needed, consider changing to [facility] per recent guidelines. Could consider spironolactone. CCB deferred per pt preference to avoid medications that may cause LEE. History of dizziness on metoprolol noted as well, though tolerating low dose Coreg well.  Labs ordered:, BMET now and at RTC Studies / Imaging ordered: None Disposition: RTC 2-3 weeks   Arvil Chaco, PA-C

## 2021-02-12 ENCOUNTER — Other Ambulatory Visit: Payer: Self-pay

## 2021-02-12 ENCOUNTER — Other Ambulatory Visit
Admission: RE | Admit: 2021-02-12 | Discharge: 2021-02-12 | Disposition: A | Discharge status: Home / Self Care | Payer: Medicare Other | Source: Ambulatory Visit | Attending: Vascular Surgery | Admitting: Vascular Surgery

## 2021-02-12 DIAGNOSIS — Z01812 Encounter for preprocedural laboratory examination: Secondary | ICD-10-CM | POA: Diagnosis present

## 2021-02-12 DIAGNOSIS — Z20822 Contact with and (suspected) exposure to covid-19: Secondary | ICD-10-CM | POA: Diagnosis not present

## 2021-02-13 ENCOUNTER — Encounter: Payer: Self-pay | Admitting: Physician Assistant

## 2021-02-13 ENCOUNTER — Ambulatory Visit: Payer: Medicare Other | Admitting: Physician Assistant

## 2021-02-13 VITALS — BP 180/80 | HR 65 | Ht 68.0 in | Wt 164.0 lb

## 2021-02-13 DIAGNOSIS — I739 Peripheral vascular disease, unspecified: Secondary | ICD-10-CM

## 2021-02-13 DIAGNOSIS — I2510 Atherosclerotic heart disease of native coronary artery without angina pectoris: Secondary | ICD-10-CM | POA: Diagnosis not present

## 2021-02-13 DIAGNOSIS — I6523 Occlusion and stenosis of bilateral carotid arteries: Secondary | ICD-10-CM

## 2021-02-13 DIAGNOSIS — E785 Hyperlipidemia, unspecified: Secondary | ICD-10-CM

## 2021-02-13 DIAGNOSIS — R002 Palpitations: Secondary | ICD-10-CM

## 2021-02-13 DIAGNOSIS — I70219 Atherosclerosis of native arteries of extremities with intermittent claudication, unspecified extremity: Secondary | ICD-10-CM

## 2021-02-13 DIAGNOSIS — Z794 Long term (current) use of insulin: Secondary | ICD-10-CM

## 2021-02-13 DIAGNOSIS — I071 Rheumatic tricuspid insufficiency: Secondary | ICD-10-CM

## 2021-02-13 DIAGNOSIS — I10 Essential (primary) hypertension: Secondary | ICD-10-CM | POA: Diagnosis not present

## 2021-02-13 DIAGNOSIS — E119 Type 2 diabetes mellitus without complications: Secondary | ICD-10-CM

## 2021-02-13 DIAGNOSIS — I25118 Atherosclerotic heart disease of native coronary artery with other forms of angina pectoris: Secondary | ICD-10-CM

## 2021-02-13 DIAGNOSIS — Z951 Presence of aortocoronary bypass graft: Secondary | ICD-10-CM

## 2021-02-13 DIAGNOSIS — I5032 Chronic diastolic (congestive) heart failure: Secondary | ICD-10-CM

## 2021-02-13 LAB — SARS CORONAVIRUS 2 (TAT 6-24 HRS): SARS Coronavirus 2: NEGATIVE

## 2021-02-13 MED ORDER — VALSARTAN 160 MG PO TABS: 160.0000 mg | ORAL_TABLET | Freq: Every day | ORAL | 3 refills | Status: DC

## 2021-02-13 MED ORDER — CLONIDINE HCL 0.1 MG PO TABS
0.1000 mg | ORAL_TABLET | Freq: Once | ORAL | Status: AC
  Administered 2021-02-13: 0.1 mg via ORAL

## 2021-02-13 NOTE — Patient Instructions (Addendum)
Medication Instructions:  Your physician has recommended you make the following change in your medication:   STOP Bendazepril  START Valsartan 160mg  DAILY  Continue to take clonidine for SBP (top number of blood pressure reading) higher than 150. After 1 week on valsartan 160mg , call the office and let us know your blood pressure. If you still need to take clonidine to control your blood pressure, we will have you increase to valsartan 160mg  twice daily. If your blood pressure is well controlled off of the clonidine, we will keep you at the once daily valsartan 160mg .   *If you need a refill on your cardiac medications before your next appointment, please call your pharmacy*   Lab Work: None ordered   Testing/Procedures: None ordered   Follow-Up: At [facility], you and your health needs are our priority.  As part of our continuing mission to provide you with exceptional heart care, we have created designated Provider Care Teams.  These Care Teams include your primary Cardiologist (physician) and Advanced Practice Providers (APPs -  Physician Assistants and Nurse Practitioners) who all work together to provide you with the care you need, when you need it.  We recommend signing up for the patient portal called "MyChart".  Sign up information is provided on this After Visit Summary.  MyChart is used to connect with patients for Virtual Visits (Telemedicine).  Patients are able to view lab/test results, encounter notes, upcoming appointments, etc.  Non-urgent messages can be sent to your provider as well.   To learn more about what you can do with MyChart, go to [email or web address].    Your next appointment:   2 - 3 week(s)  The format for your next appointment:   In Person  Provider:   You may see Ida Rogue, MD or one of the following Advanced Practice Providers on your designated Care Team:     Marrianne Mood, PA-C   Other Instructions  Continue to take  clonidine for SBP (top number of blood pressure reading) higher than 150. After 1 week on valsartan 160mg , call the office and let us know your blood pressure. If you still need to take clonidine to control your blood pressure, we will have you increase to valsartan 160mg  twice daily. If your blood pressure is well controlled off of the clonidine, we will keep you at the once daily valsartan 160mg .

## 2021-02-16 ENCOUNTER — Encounter: Payer: Self-pay | Admitting: Vascular Surgery

## 2021-02-16 ENCOUNTER — Ambulatory Visit
Admission: RE | Admit: 2021-02-16 | Discharge: 2021-02-16 | Disposition: A | Discharge status: Home / Self Care | Payer: Medicare Other | Attending: Vascular Surgery | Admitting: Vascular Surgery

## 2021-02-16 ENCOUNTER — Encounter
Admission: RE | Disposition: A | Discharge status: Home / Self Care | Payer: Self-pay | Source: Home / Self Care | Attending: Vascular Surgery

## 2021-02-16 ENCOUNTER — Other Ambulatory Visit (INDEPENDENT_AMBULATORY_CARE_PROVIDER_SITE_OTHER): Payer: Self-pay | Admitting: Nurse Practitioner

## 2021-02-16 ENCOUNTER — Other Ambulatory Visit: Payer: Self-pay

## 2021-02-16 DIAGNOSIS — L97529 Non-pressure chronic ulcer of other part of left foot with unspecified severity: Secondary | ICD-10-CM | POA: Diagnosis not present

## 2021-02-16 DIAGNOSIS — I10 Essential (primary) hypertension: Secondary | ICD-10-CM | POA: Insufficient documentation

## 2021-02-16 DIAGNOSIS — E1151 Type 2 diabetes mellitus with diabetic peripheral angiopathy without gangrene: Secondary | ICD-10-CM | POA: Insufficient documentation

## 2021-02-16 DIAGNOSIS — I70221 Atherosclerosis of native arteries of extremities with rest pain, right leg: Secondary | ICD-10-CM | POA: Insufficient documentation

## 2021-02-16 DIAGNOSIS — I70239 Atherosclerosis of native arteries of right leg with ulceration of unspecified site: Secondary | ICD-10-CM | POA: Diagnosis not present

## 2021-02-16 DIAGNOSIS — I70245 Atherosclerosis of native arteries of left leg with ulceration of other part of foot: Secondary | ICD-10-CM | POA: Diagnosis not present

## 2021-02-16 DIAGNOSIS — E11621 Type 2 diabetes mellitus with foot ulcer: Secondary | ICD-10-CM | POA: Insufficient documentation

## 2021-02-16 DIAGNOSIS — E782 Mixed hyperlipidemia: Secondary | ICD-10-CM | POA: Insufficient documentation

## 2021-02-16 DIAGNOSIS — I70219 Atherosclerosis of native arteries of extremities with intermittent claudication, unspecified extremity: Secondary | ICD-10-CM

## 2021-02-16 HISTORY — PX: LOWER EXTREMITY ANGIOGRAPHY: CATH118251

## 2021-02-16 LAB — GLUCOSE, CAPILLARY
Glucose-Capillary: 136 mg/dL — ABNORMAL HIGH (ref 70–99)
Glucose-Capillary: 161 mg/dL — ABNORMAL HIGH (ref 70–99)

## 2021-02-16 LAB — CREATININE, SERUM
Creatinine, Ser: 0.83 mg/dL (ref 0.44–1.00)
GFR, Estimated: >60 mL/min (ref >60)

## 2021-02-16 LAB — BUN: BUN: 20 mg/dL (ref 8–23)

## 2021-02-16 SURGERY — LOWER EXTREMITY ANGIOGRAPHY
Anesthesia: Moderate Sedation | Site: Leg Lower | Laterality: Right

## 2021-02-16 MED ORDER — FAMOTIDINE 20 MG PO TABS: 40.0000 mg | ORAL_TABLET | Freq: Once | ORAL | Status: DC | PRN

## 2021-02-16 MED ORDER — HEPARIN SODIUM (PORCINE) 1000 UNIT/ML IJ SOLN
INTRAMUSCULAR | Status: DC | PRN
  Administered 2021-02-16: 5000 [IU] via INTRAVENOUS

## 2021-02-16 MED ORDER — HYDRALAZINE HCL 20 MG/ML IJ SOLN
INTRAMUSCULAR | Status: AC
  Administered 2021-02-16: 10 mg
  Filled 2021-02-16: qty 1

## 2021-02-16 MED ORDER — HYDROMORPHONE HCL 1 MG/ML IJ SOLN: 1.0000 mg | Freq: Once | INTRAMUSCULAR | Status: DC | PRN

## 2021-02-16 MED ORDER — DIPHENHYDRAMINE HCL 50 MG/ML IJ SOLN: 50.0000 mg | Freq: Once | INTRAMUSCULAR | Status: DC | PRN

## 2021-02-16 MED ORDER — METHYLPREDNISOLONE SODIUM SUCC 125 MG IJ SOLR: 125.0000 mg | Freq: Once | INTRAMUSCULAR | Status: DC | PRN

## 2021-02-16 MED ORDER — CEFAZOLIN SODIUM-DEXTROSE 2-4 GM/100ML-% IV SOLN
2.0000 g | Freq: Once | INTRAVENOUS | Status: AC
  Administered 2021-02-16: 2 g via INTRAVENOUS

## 2021-02-16 MED ORDER — MIDAZOLAM HCL 5 MG/5ML IJ SOLN
INTRAMUSCULAR | Status: AC
  Filled 2021-02-16: qty 5

## 2021-02-16 MED ORDER — HEPARIN SODIUM (PORCINE) 1000 UNIT/ML IJ SOLN
INTRAMUSCULAR | Status: AC
  Filled 2021-02-16: qty 1

## 2021-02-16 MED ORDER — MIDAZOLAM HCL 2 MG/ML PO SYRP: 8.0000 mg | ORAL_SOLUTION | Freq: Once | ORAL | Status: DC | PRN

## 2021-02-16 MED ORDER — FENTANYL CITRATE (PF) 100 MCG/2ML IJ SOLN
INTRAMUSCULAR | Status: DC | PRN
  Administered 2021-02-16: 25 ug via INTRAVENOUS

## 2021-02-16 MED ORDER — CEFAZOLIN SODIUM-DEXTROSE 2-4 GM/100ML-% IV SOLN
INTRAVENOUS | Status: AC
  Filled 2021-02-16: qty 100

## 2021-02-16 MED ORDER — MIDAZOLAM HCL 2 MG/2ML IJ SOLN
INTRAMUSCULAR | Status: DC | PRN
  Administered 2021-02-16 (×2): 1 mg via INTRAVENOUS

## 2021-02-16 MED ORDER — FENTANYL CITRATE (PF) 100 MCG/2ML IJ SOLN
INTRAMUSCULAR | Status: AC
  Filled 2021-02-16: qty 2

## 2021-02-16 MED ORDER — NITROGLYCERIN 1 MG/10 ML FOR IR/CATH LAB
INTRA_ARTERIAL | Status: DC | PRN
  Administered 2021-02-16: 300 ug

## 2021-02-16 MED ORDER — ONDANSETRON HCL 4 MG/2ML IJ SOLN: 4.0000 mg | Freq: Four times a day (QID) | INTRAMUSCULAR | Status: DC | PRN

## 2021-02-16 MED ORDER — SODIUM CHLORIDE 0.9 % IV SOLN: INTRAVENOUS | Status: DC

## 2021-02-16 SURGICAL SUPPLY — 20 items
BALLN LUTONIX 018 5X150X130 (BALLOONS) ×2
BALLN ULTRVRSE 2.5X220X150 (BALLOONS) ×2
BALLN ULTRVRSE 3X150X150 (BALLOONS) ×2
BALLOON LUTONIX 018 5X150X130 (BALLOONS) ×1 IMPLANT
BALLOON ULTRVRSE 2.5X220X150 (BALLOONS) ×1 IMPLANT
BALLOON ULTRVRSE 3X150X150 (BALLOONS) ×1 IMPLANT
CATH ANGIO 5F PIGTAIL 65CM (CATHETERS) ×2 IMPLANT
CATH NAVICROSS ANGLED 135CM (MICROCATHETER) ×2 IMPLANT
CATH VERT 5X100 (CATHETERS) ×2 IMPLANT
COVER PROBE U/S 5X48 (MISCELLANEOUS) ×2 IMPLANT
DEVICE STARCLOSE SE CLOSURE (Vascular Products) ×2 IMPLANT
GLIDEWIRE ADV .035X260CM (WIRE) ×2 IMPLANT
KIT ENCORE 26 ADVANTAGE (KITS) ×2 IMPLANT
PACK ANGIOGRAPHY (CUSTOM PROCEDURE TRAY) ×2 IMPLANT
SHEATH BRITE TIP 5FRX11 (SHEATH) ×2 IMPLANT
SHEATH RAABE 6FRX70 (SHEATH) ×2 IMPLANT
SYR MEDRAD MARK 7 150ML (SYRINGE) ×2 IMPLANT
TUBING CONTRAST HIGH PRESS 48 (TUBING) ×4 IMPLANT
WIRE G V18X300CM (WIRE) ×2 IMPLANT
WIRE GUIDERIGHT .035X150 (WIRE) ×2 IMPLANT

## 2021-02-16 NOTE — H&P (Signed)
[facility] Admission History & Physical  MRN : [id number]  Carrie Kane is a 80 y.o. (1940-10-27) female who presents with chief complaint of No chief complaint on file. Marland Kitchen  History of Present Illness: Patient presents for right lower extremity angiogram with possible revascularization.  Has known severe disease bilaterally.  Underwent left lower extremity revascularization previously and has done well from that.  No new complaints today.  Current Facility-Administered Medications  Medication Dose Route Frequency Provider Last Rate Last Admin  . 0.9 %  sodium chloride infusion   Intravenous Continuous Eulogio Ditch E, NP      . ceFAZolin (ANCEF) 2-4 GM/100ML-% IVPB           . ceFAZolin (ANCEF) IVPB 2g/100 mL premix  2 g Intravenous Once Eulogio Ditch E, NP      . diphenhydrAMINE (BENADRYL) injection 50 mg  50 mg Intravenous Once PRN Kris Hartmann, NP      . famotidine (PEPCID) tablet 40 mg  40 mg Oral Once PRN Kris Hartmann, NP      . fentaNYL (SUBLIMAZE) 100 MCG/2ML injection           . heparin sodium (porcine) 1000 UNIT/ML injection           . HYDROmorphone (DILAUDID) injection 1 mg  1 mg Intravenous Once PRN Eulogio Ditch E, NP      . methylPREDNISolone sodium succinate (SOLU-MEDROL) 125 mg/2 mL injection 125 mg  125 mg Intravenous Once PRN Eulogio Ditch E, NP      . midazolam (VERSED) 2 MG/ML syrup 8 mg  8 mg Oral Once PRN Kris Hartmann, NP      . midazolam (VERSED) 5 MG/5ML injection           . ondansetron (ZOFRAN) injection 4 mg  4 mg Intravenous Q6H PRN Kris Hartmann, NP        Past Medical History:  Diagnosis Date  . CAD (coronary artery disease)    a. 04/2017 Cath: LM 80, LAD 80p, 32m, LCX 95ost, 81m, EF 45-50%; b. 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  . Diastolic dysfunction    a. 04/2017 Echo: EF 55-60%, no rwma, Gr1 DD, mildly dil LA.  Marland Kitchen Hyperlipidemia   . Hypertensive heart disease   . Insulin dependent diabetes mellitus      Past Surgical History:  Procedure Laterality Date  . ABDOMINAL HYSTERECTOMY    . CORONARY ARTERY BYPASS GRAFT N/A 05/09/2017   Procedure: CORONARY ARTERY BYPASS GRAFTING (CABG) x 3 using left internal mammary artery and right greater saphenous vein harvested endoscopically;  Surgeon: Ivin Poot, MD;  Location: [place];  Service: Open Heart Surgery;  Laterality: N/A;  . INTRAOPERATIVE TRANSESOPHAGEAL ECHOCARDIOGRAM N/A 05/09/2017   Procedure: INTRAOPERATIVE TRANSESOPHAGEAL ECHOCARDIOGRAM;  Surgeon: Ivin Poot, MD;  Location: [place];  Service: Open Heart Surgery;  Laterality: N/A;  . IR RADIOLOGIST EVAL & MGMT  12/04/2020  . LEFT HEART CATH AND CORONARY ANGIOGRAPHY N/A 05/02/2017   Procedure: Left Heart Cath and Coronary Angiography;  Surgeon: Wellington Hampshire, MD;  Location: [place] [facility];  Service: Cardiovascular;  Laterality: N/A;  . LOWER EXTREMITY ANGIOGRAPHY Left 02/09/2021   Procedure: LOWER EXTREMITY ANGIOGRAPHY;  Surgeon: Algernon Huxley, MD;  Location: [place] [facility];  Service: Cardiovascular;  Laterality: Left;     Social History   Tobacco Use  . Smoking status: Never Smoker  . Smokeless tobacco: Never Used  Substance Use Topics  .  Alcohol use: No  . Drug use: No     Family History  Problem Relation Age of Onset  . Breast cancer Maternal Aunt   No bleeding disorders, clotting disorders, autoimmune diseases, or aneurysms  Allergies  Allergen Reactions  . Flexeril [Cyclobenzaprine] Hypertension     REVIEW OF SYSTEMS (Negative unless checked)  Constitutional: [] Weight loss  [] Fever  [] Chills Cardiac: [] Chest pain   [] Chest pressure   [] Palpitations   [] Shortness of breath when laying flat   [] Shortness of breath at rest   [] Shortness of breath with exertion. Vascular:  [] Pain in legs with walking   [] Pain in legs at rest   [] Pain in legs when laying flat   [] Claudication   [] Pain in feet when walking  [] Pain in feet at rest  [] Pain in feet when  laying flat   [] History of DVT   [] Phlebitis   [] Swelling in legs   [] Varicose veins   [x] Non-healing ulcers Pulmonary:   [] Uses home oxygen   [] Productive cough   [] Hemoptysis   [] Wheeze  [] COPD   [] Asthma Neurologic:  [] Dizziness  [] Blackouts   [] Seizures   [] History of stroke   [] History of TIA  [] Aphasia   [] Temporary blindness   [] Dysphagia   [] Weakness or numbness in arms   [] Weakness or numbness in legs Musculoskeletal:  [x] Arthritis   [] Joint swelling   [x] Joint pain   [] Low back pain Hematologic:  [] Easy bruising  [] Easy bleeding   [] Hypercoagulable state   [] Anemic  [] Hepatitis Gastrointestinal:  [] Blood in stool   [] Vomiting blood  [] Gastroesophageal reflux/heartburn   [] Difficulty swallowing. Genitourinary:  [] Chronic kidney disease   [] Difficult urination  [] Frequent urination  [] Burning with urination   [] Blood in urine Skin:  [] Rashes   [x] Ulcers   [x] Wounds Psychological:  [] History of anxiety   []  History of major depression.  Physical Examination  Vitals:   02/16/21 0752  BP: (!) 189/73  Pulse: 60  Resp: 20  Temp: 98.1 F (36.7 C)  TempSrc: Oral  SpO2: 100%  Weight: 74.4 kg  Height: 5\' 8"  (1.727 m)   Body mass index is 24.94 kg/m. Gen: WD/WN, NAD Head: /AT, No temporalis wasting.  Ear/Nose/Throat: Hearing grossly intact, nares w/o erythema or drainage, oropharynx w/o Erythema/Exudate,  Eyes: Conjunctiva clear, sclera non-icteric Neck: Trachea midline.  No JVD.  Pulmonary:  Good air movement, respirations not labored, no use of accessory muscles.  Cardiac: RRR, normal S1, S2. Vascular:  Vessel Right Left  Radial Palpable Palpable                          PT  not palpable  1+ palpable  DP  not palpable  1+ palpable   Gastrointestinal: soft, non-tender/non-distended. No guarding/reflex.  Musculoskeletal: M/S 5/5 throughout.  Extremities without ischemic changes.  No deformity or atrophy.  Neurologic: Sensation grossly intact in extremities.   Symmetrical.  Speech is fluent. Motor exam as listed above. Psychiatric: Judgment intact, Mood & affect appropriate for pt's clinical situation. Dermatologic: Left foot ulcer currently dressed     CBC Lab Results  Component Value Date   WBC 5.5 08/11/2020   HGB 12.0 08/11/2020   HCT 37.3 08/11/2020   MCV 93 08/11/2020   PLT 227 08/11/2020    BMET    Component Value Date/Time   NA 136 08/19/2020 1515   NA 141 08/11/2020 1058   K 4.2 08/19/2020 1515   CL 97 (L) 08/19/2020 1515   CO2 26  08/19/2020 1515   GLUCOSE 282 (H) 08/19/2020 1515   BUN 20 02/16/2021 0757   BUN 17 08/11/2020 1058   CREATININE 0.83 02/16/2021 0757   CALCIUM 9.5 08/19/2020 1515   GFRNONAA >60 02/16/2021 0757   GFRAA 74 08/11/2020 1058   Estimated Creatinine Clearance: 56.4 mL/min (by C-G formula based on SCr of 0.83 mg/dL).  COAG Lab Results  Component Value Date   INR 1.40 05/09/2017   INR 1.09 05/03/2017   INR 0.98 04/28/2017    Radiology PERIPHERAL VASCULAR CATHETERIZATION  Result Date: 02/09/2021 See op note    Assessment/Plan 1. PAD (peripheral artery disease) (HCC)  Recommend:  The patient has evidence of severe atherosclerotic changes of both lower extremities associated with ulceration and tissue loss of the foot.  This represents a limb threatening ischemia and places the patient at the risk for limb loss.  Patient should undergo angiography of the lower extremities with the hope for intervention for limb salvage.  She has already undergone left lower extremity revascularization.  Now here for her right leg treatment. The risks and benefits as well as the alternative therapies was discussed in detail with the patient.  All questions were answered.  Patient agrees to proceed with angiography.  The patient will follow up with me in the office after the procedure.    2. Mixed hyperlipidemia Continue statin as ordered and reviewed, no changes at this time   3. Abscess of  left foot  this will continue to be followed by podiatry.  Is nearly healed at this point.  4. Essential hypertension Continue antihypertensive medications as already ordered, these medications have been reviewed and there are no changes at this time.   Leotis Pain, MD  02/16/2021 8:55 AM

## 2021-02-16 NOTE — Progress Notes (Signed)
Dr. Lucky Cowboy at bedside: he discussed procedural results with pt. With verbalized understanding.

## 2021-02-16 NOTE — Op Note (Signed)
[facility]  Percutaneous Study/Intervention Procedural Note   Date of Surgery: 02/16/2021  Surgeon(s):Gurtaj Ruz    Assistants:none  Pre-operative Diagnosis: PAD with ulceration and pain  Post-operative diagnosis:  Same  Procedure(s) Performed:             1.  Ultrasound guidance for vascular access left femoral artery             2.  Catheter placement into right common femoral artery from left femoral approach             3.  Aortogram and selective right lower extremity angiogram             4.  Percutaneous transluminal angioplasty of right peroneal artery with 2.5 and 3 millimeters diameter angioplasty balloon             5.   Percutaneous transluminal angioplasty of right popliteal artery and most distal SFA with 5 mm diameter Lutonix drug-coated angioplasty balloon  6.  StarClose closure device left femoral artery  EBL: 10 cc  Contrast: 50 cc  Fluoro Time: 3.3 minutes  Moderate Conscious Sedation Time: approximately 35 minutes using 2 mg of Versed and 25 mcg of Fentanyl              Indications:  Patient is a 80 y.o.female with severe disease in both lower extremities. The patient has noninvasive study showing significant disease bilaterally. The patient is brought in for angiography for further evaluation and potential treatment.  Due to the limb threatening nature of the situation, angiogram was performed for attempted limb salvage. The patient is aware that if the procedure fails, amputation would be expected.  The patient also understands that even with successful revascularization, amputation may still be required due to the severity of the situation. Risks and benefits are discussed and informed consent is obtained.   Procedure:  The patient was identified and appropriate procedural time out was performed.  The patient was then placed supine on the table and prepped and draped in the usual sterile fashion. Moderate conscious sedation was  administered during a face to face encounter with the patient throughout the procedure with my supervision of the RN administering medicines and monitoring the patient's vital signs, pulse oximetry, telemetry and mental status throughout from the start of the procedure until the patient was taken to the recovery room. Ultrasound was used to evaluate the left common femoral artery.  It was patent .  A digital ultrasound image was acquired.  A Seldinger needle was used to access the left common femoral artery under direct ultrasound guidance and a permanent image was performed.  A 0.035 J wire was advanced without resistance and a 5Fr sheath was placed.  Pigtail catheter was placed into the aorta and an AP aortogram was performed. This demonstrated normal renal arteries and normal aorta and iliac segments without significant stenosis. I then crossed the aortic bifurcation and advanced to the right femoral head. Selective right lower extremity angiogram was then performed. This demonstrated normal common femoral artery, profunda femoris artery, and proximal to mid superficial femoral artery.  In the distal SFA and above-knee popliteal artery there were 2 areas of moderate stenosis in the 60 to 65% range.  The below-knee popliteal artery normalized.  There was then very severe tibial disease.  There was a typical tibial trifurcation.  The anterior tibial artery occluded after several centimeters and did not reconstitute distally.  The posterior tibial artery was not seen.  The  peroneal artery was occluded over the first 10 to 12 cm but reconstituted in the proximal to mid peroneal artery and was the only runoff distally. It was felt that it was in the patient's best interest to proceed with intervention after these images to avoid a second procedure and a larger amount of contrast and fluoroscopy based off of the findings from the initial angiogram. The patient was systemically heparinized and a 6 French  70 cmAnsell  sheath was then placed over the Terumo Advantage wire. I then used a Kumpe catheter and the advantage wire to navigate through the popliteal lesion and down in the tibioperoneal trunk.  I then exchanged for a V 18 wire and cross the peroneal artery occlusion without difficulty parking the wire at the ankle.  I then proceeded with treatment.  A 2.5 mm diameter by 22 cm length angioplasty balloon was inflated from the mid peroneal artery up to the tibioperoneal trunk and inflated to 12 atm for 1 minute.  There is still an area of residual narrowing in the proximal peroneal artery so upsized to a 3 mm diameter by 15 cm length angioplasty balloon inflated this to 8 atm for 1 minute.  Completion imaging following this showed only about a 20 to 25% residual stenosis.  I then turned my attention to the popliteal artery.  This was treated with a 5 mm diameter by 15 cm length Lutonix drug-coated angioplasty balloon inflated to 10 atm for 1 minute.  Completion imaging following this showed significant improvement with about a 15 to 20% residual stenosis in both locations. I elected to terminate the procedure. The sheath was removed and StarClose closure device was deployed in the left femoral artery with excellent hemostatic result. The patient was taken to the recovery room in stable condition having tolerated the procedure well.  Findings:               Aortogram:  normal renal arteries, normal aorta and iliac arteries without significant stenosis             Right Lower Extremity:  Normal common femoral artery, profunda femoris artery, and proximal to mid superficial femoral artery.  In the distal SFA and above-knee popliteal artery there were 2 areas of moderate stenosis in the 60 to 65% range.  The below-knee popliteal artery normalized.  There was then very severe tibial disease.  There was a typical tibial trifurcation.  The anterior tibial artery occluded after several centimeters and did not reconstitute  distally.  The posterior tibial artery was not seen.  The peroneal artery was occluded over the first 10 to 12 cm but reconstituted in the proximal to mid peroneal artery and was the only runoff distally.   Disposition: Patient was taken to the recovery room in stable condition having tolerated the procedure well.  Complications: None  Leotis Pain 02/16/2021 9:32 AM   This note was created with Dragon Medical transcription system. Any errors in dictation are purely unintentional.

## 2021-02-20 ENCOUNTER — Other Ambulatory Visit: Payer: Self-pay | Admitting: *Deleted

## 2021-02-20 DIAGNOSIS — E119 Type 2 diabetes mellitus without complications: Secondary | ICD-10-CM

## 2021-02-21 LAB — MICROALBUMIN, URINE: Microalbumin, Urine: 4.3 ug/mL

## 2021-02-21 LAB — HEMOGLOBIN A1C
Est. average glucose Bld gHb Est-mCnc: 214 mg/dL
Hgb A1c MFr Bld: 9.1 % — ABNORMAL HIGH (ref 4.8–5.6)

## 2021-02-26 ENCOUNTER — Other Ambulatory Visit: Payer: Self-pay | Admitting: *Deleted

## 2021-02-26 MED ORDER — INSULIN GLARGINE 100 UNIT/ML ~~LOC~~ SOLN: SUBCUTANEOUS | 3 refills | Status: AC

## 2021-03-03 ENCOUNTER — Encounter: Payer: Self-pay | Admitting: Internal Medicine

## 2021-03-03 ENCOUNTER — Other Ambulatory Visit: Payer: Self-pay

## 2021-03-03 ENCOUNTER — Ambulatory Visit: Payer: Medicare Other | Admitting: Internal Medicine

## 2021-03-03 VITALS — BP 160/80 | HR 56 | Ht 68.0 in | Wt 159.6 lb

## 2021-03-03 DIAGNOSIS — I10 Essential (primary) hypertension: Secondary | ICD-10-CM

## 2021-03-03 DIAGNOSIS — E782 Mixed hyperlipidemia: Secondary | ICD-10-CM

## 2021-03-03 DIAGNOSIS — I739 Peripheral vascular disease, unspecified: Secondary | ICD-10-CM | POA: Diagnosis not present

## 2021-03-03 DIAGNOSIS — E039 Hypothyroidism, unspecified: Secondary | ICD-10-CM

## 2021-03-03 DIAGNOSIS — E119 Type 2 diabetes mellitus without complications: Secondary | ICD-10-CM

## 2021-03-03 DIAGNOSIS — Z794 Long term (current) use of insulin: Secondary | ICD-10-CM

## 2021-03-03 NOTE — Assessment & Plan Note (Signed)
Patient denies any claudication.

## 2021-03-03 NOTE — Progress Notes (Signed)
Established Patient Office Visit  Subjective:  Patient ID: Carrie Kane, female    DOB: 10/17/1941  Age: 80 y.o. MRN: [id number]  CC:  Chief Complaint  Patient presents with  . Lab Results    HPI  Carrie Kane presents for  Check up , pt need colon exam / pt has stent  In both lower extremity  Past Medical History:  Diagnosis Date  . CAD (coronary artery disease)    a. 04/2017 Cath: LM 80, LAD 80p, 90m, LCX 95ost, 22m, EF 45-50%; b. 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  . Diastolic dysfunction    a. 04/2017 Echo: EF 55-60%, no rwma, Gr1 DD, mildly dil LA.  Marland Kitchen Hyperlipidemia   . Hypertensive heart disease   . Insulin dependent diabetes mellitus     Past Surgical History:  Procedure Laterality Date  . ABDOMINAL HYSTERECTOMY    . CORONARY ARTERY BYPASS GRAFT N/A 05/09/2017   Procedure: CORONARY ARTERY BYPASS GRAFTING (CABG) x 3 using left internal mammary artery and right greater saphenous vein harvested endoscopically;  Surgeon: Ivin Poot, MD;  Location: [place];  Service: Open Heart Surgery;  Laterality: N/A;  . INTRAOPERATIVE TRANSESOPHAGEAL ECHOCARDIOGRAM N/A 05/09/2017   Procedure: INTRAOPERATIVE TRANSESOPHAGEAL ECHOCARDIOGRAM;  Surgeon: Ivin Poot, MD;  Location: [place];  Service: Open Heart Surgery;  Laterality: N/A;  . IR RADIOLOGIST EVAL & MGMT  12/04/2020  . LEFT HEART CATH AND CORONARY ANGIOGRAPHY N/A 05/02/2017   Procedure: Left Heart Cath and Coronary Angiography;  Surgeon: Wellington Hampshire, MD;  Location: [place] [facility];  Service: Cardiovascular;  Laterality: N/A;  . LOWER EXTREMITY ANGIOGRAPHY Left 02/09/2021   Procedure: LOWER EXTREMITY ANGIOGRAPHY;  Surgeon: Algernon Huxley, MD;  Location: [place] [facility];  Service: Cardiovascular;  Laterality: Left;  . LOWER EXTREMITY ANGIOGRAPHY Right 02/16/2021   Procedure: LOWER EXTREMITY ANGIOGRAPHY;  Surgeon: Algernon Huxley, MD;  Location: [place] [facility];  Service: Cardiovascular;  Laterality:  Right;    Family History  Problem Relation Age of Onset  . Breast cancer Maternal Aunt     Social History   Socioeconomic History  . Marital status: Married    Spouse name: [name]   . Number of children: 1  . Years of education: Not on file  . Highest education level: Not on file  Occupational History  . Not on file  Tobacco Use  . Smoking status: Never Smoker  . Smokeless tobacco: Never Used  Substance and Sexual Activity  . Alcohol use: No  . Drug use: No  . Sexual activity: Not on file  Other Topics Concern  . Not on file  Social History Narrative   Lives at home with spouse    Social Determinants of Health   Financial Resource Strain: Not on file  Food Insecurity: Not on file  Transportation Needs: Not on file  Physical Activity: Not on file  Stress: Not on file  Social Connections: Not on file  Intimate Partner Violence: Not on file     Current Outpatient Medications:  .  aspirin EC 81 MG tablet, Take 1 tablet (81 mg total) by mouth daily., Disp: 90 tablet, Rfl: 3 .  atorvastatin (LIPITOR) 40 MG tablet, Take 1 tablet (40 mg total) by mouth daily., Disp: 90 tablet, Rfl: 3 .  BD INSULIN SYRINGE U/F 30G X 1/2" 0.5 ML MISC, , Disp: , Rfl:  .  carvedilol (COREG) 3.125 MG tablet, Take 1 tablet (3.125 mg total) by mouth 2 (  two) times daily with a meal., Disp: 180 tablet, Rfl: 2 .  Chlorpheniramine Maleate (ALLERGY PO), Take 1 tablet by mouth daily as needed (allergies)., Disp: , Rfl:  .  cholecalciferol (VITAMIN D) 1000 units tablet, Take 2,000 Units by mouth daily at 12 noon. , Disp: , Rfl:  .  cloNIDine (CATAPRES) 0.2 MG tablet, Take 1 tablet (0.2 mg total) by mouth 2 (two) times daily., Disp: 90 tablet, Rfl: 3 .  clopidogrel (PLAVIX) 75 MG tablet, TAKE 1 TABLET DAILY, Disp: 90 tablet, Rfl: 3 .  ferrous sulfate 325 (65 FE) MG EC tablet, Take 1 tablet (325 mg total) by mouth daily at 2 PM., Disp: 90 tablet, Rfl: 3 .  HUMALOG KWIKPEN 100 UNIT/ML KwikPen, INJECT 38  UNITS UNDER THE SKIN DAILY, Disp: 45 mL, Rfl: 2 .  HUMALOG MIX 75/25 KWIKPEN (75-25) 100 UNIT/ML Kwikpen, , Disp: , Rfl:  .  hydrochlorothiazide (MICROZIDE) 12.5 MG capsule, Take 1 capsule (12.5 mg total) by mouth daily., Disp: 90 capsule, Rfl: 2 .  insulin glargine (LANTUS) 100 UNIT/ML injection, INJECT 45 UNITS UNDER THE SKIN AT BEDTIME, Disp: 40 mL, Rfl: 3 .  levothyroxine (SYNTHROID) 100 MCG tablet, Take 1 tablet (100 mcg total) by mouth daily before breakfast., Disp: 90 tablet, Rfl: 3 .  Multiple Vitamin (MULTIVITAMIN) capsule, Take 1 capsule by mouth daily at 12 noon. , Disp: , Rfl:  .  nitroGLYCERIN (NITROSTAT) 0.4 MG SL tablet, Place 1 tablet (0.4 mg total) under the tongue every 5 (five) minutes as needed for chest pain., Disp: 90 tablet, Rfl: 3 .  Polyethyl Glycol-Propyl Glycol (SYSTANE OP), Apply 1 drop to eye as needed (dry eyes)., Disp: , Rfl:  .  valsartan (DIOVAN) 160 MG tablet, Take 1 tablet (160 mg total) by mouth daily., Disp: 30 tablet, Rfl: 3   Allergies  Allergen Reactions  . Flexeril [Cyclobenzaprine] Hypertension    ROS Review of Systems  Constitutional: Negative.   HENT: Negative.   Eyes: Negative.   Respiratory: Negative.   Cardiovascular: Negative.   Gastrointestinal: Negative.   Endocrine: Negative.   Genitourinary: Negative.   Musculoskeletal: Negative.   Skin: Negative.   Allergic/Immunologic: Negative.   Neurological: Negative.   Hematological: Negative.   Psychiatric/Behavioral: Negative.   All other systems reviewed and are negative.     Objective:    Physical Exam Vitals reviewed.  Constitutional:      Appearance: Normal appearance.  HENT:     Mouth/Throat:     Mouth: Mucous membranes are moist.  Eyes:     Pupils: Pupils are equal, round, and reactive to light.  Neck:     Vascular: No carotid bruit.  Cardiovascular:     Rate and Rhythm: Normal rate and regular rhythm.     Pulses: Normal pulses.     Heart sounds: Normal heart sounds.   Pulmonary:     Effort: Pulmonary effort is normal.     Breath sounds: Normal breath sounds.  Abdominal:     General: Bowel sounds are normal.     Palpations: Abdomen is soft. There is no hepatomegaly, splenomegaly or mass.     Tenderness: There is no abdominal tenderness.     Hernia: No hernia is present.  Musculoskeletal:        General: No tenderness.     Cervical back: Neck supple.     Right lower leg: No edema.     Left lower leg: No edema.  Skin:    Findings: No rash.  Neurological:  Mental Status: She is alert and oriented to person, place, and time.     Motor: No weakness.  Psychiatric:        Mood and Affect: Mood and affect normal.        Behavior: Behavior normal.     BP (!) 160/80   Pulse (!) 56   Ht 5\' 8"  (1.727 m)   Wt 159 lb 9.6 oz (72.4 kg)   BMI 24.27 kg/m  Wt Readings from Last 3 Encounters:  03/03/21 159 lb 9.6 oz (72.4 kg)  02/16/21 164 lb 0.4 oz (74.4 kg)  02/13/21 164 lb (74.4 kg)     Health Maintenance Due  Topic Date Due  . Hepatitis C Screening  Never done  . TETANUS/TDAP  Never done  . PNA vac Low Risk Adult (1 of 2 - PCV13) Never done    There are no preventive care reminders to display for this patient.  Lab Results  Component Value Date   TSH 0.372 05/03/2017   Lab Results  Component Value Date   WBC 5.5 08/11/2020   HGB 12.0 08/11/2020   HCT 37.3 08/11/2020   MCV 93 08/11/2020   PLT 227 08/11/2020   Lab Results  Component Value Date   NA 136 08/19/2020   K 4.2 08/19/2020   CO2 26 08/19/2020   GLUCOSE 282 (H) 08/19/2020   BUN 20 02/16/2021   CREATININE 0.83 02/16/2021   CALCIUM 9.5 08/19/2020   ANIONGAP 13 08/19/2020   Lab Results  Component Value Date   CHOL 132 08/11/2020   Lab Results  Component Value Date   HDL 68 08/11/2020   Lab Results  Component Value Date   LDLCALC 49 08/11/2020   Lab Results  Component Value Date   TRIG 79 08/11/2020   Lab Results  Component Value Date   CHOLHDL 1.9  08/11/2020   Lab Results  Component Value Date   HGBA1C 9.1 (H) 02/20/2021      Assessment & Plan:   Problem List Items Addressed This Visit      Cardiovascular and Mediastinum   PAD (peripheral artery disease) ([place]) - Primary    Patient denies any claudication.      Essential hypertension    Blood pressure is elevated today.  Recheck cholesterol again        Endocrine   Diabetes mellitus type 2, insulin dependent ([place])    - The patient's blood sugar is under control on med. - The patient will continue the current treatment regimen.  - I encouraged the patient to regularly check blood sugar.  - I encouraged the patient to monitor diet. I encouraged the patient to eat low-carb and low-sugar to help prevent blood sugar spikes.  - I encouraged the patient to continue following their prescribed treatment plan for diabetes - I informed the patient to get help if blood sugar drops below 54mg /dL, or if suddenly have trouble thinking clearly or breathing.       Hypothyroidism    Patient was advised to continue using levothyroxine        Other   Mixed hyperlipidemia    Hypercholesterolemia  I advised the patient to follow Mediterranean diet This diet is rich in fruits vegetables and whole grain, and This diet is also rich in fish and lean meat Patient should also eat a handful of almonds or walnuts daily Recent heart study indicated that average follow-up on this kind of diet reduces the cardiovascular mortality by 50 to 70%==  No orders of the defined types were placed in this encounter.   Follow-up: No follow-ups on file.    Cletis Athens, MD

## 2021-03-03 NOTE — Assessment & Plan Note (Signed)
Blood pressure is elevated today.  Recheck cholesterol again

## 2021-03-03 NOTE — Assessment & Plan Note (Signed)
Hypercholesterolemia  I advised the patient to follow Mediterranean diet This diet is rich in fruits vegetables and whole grain, and This diet is also rich in fish and lean meat Patient should also eat a handful of almonds or walnuts daily Recent heart study indicated that average follow-up on this kind of diet reduces the cardiovascular mortality by 50 to 70%== 

## 2021-03-03 NOTE — Assessment & Plan Note (Signed)

## 2021-03-03 NOTE — Assessment & Plan Note (Signed)
Patient was advised to continue using levothyroxine

## 2021-03-04 NOTE — Progress Notes (Signed)
Office Visit    Patient Name: Carrie Kane Date of Encounter: 03/06/2021  Primary Care Provider:  Cletis Athens, MD Primary Cardiologist:  Ida Rogue, MD  Chief Complaint    Chief Complaint  Patient presents with  . Follow-up    3 Week follow up. Medications verbally reviewed with patient.     80 yo female with history of CAD s/p CABG x 3 (LIMA to LAD, SVG to Diag, SVG to OM - 2018), carotid stenosis, PAD, HTN, HLD, IDDM, HFpEF, and here today for 3-week follow-up.  Past Medical History    Past Medical History:  Diagnosis Date  . CAD (coronary artery disease)    a. 04/2017 Cath: LM 80, LAD 80p, 65m, LCX 95ost, 41m, EF 45-50%; b. 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  . Diastolic dysfunction    a. 04/2017 Echo: EF 55-60%, no rwma, Gr1 DD, mildly dil LA.  Marland Kitchen Hyperlipidemia   . Hypertensive heart disease   . Insulin dependent diabetes mellitus    Past Surgical History:  Procedure Laterality Date  . ABDOMINAL HYSTERECTOMY    . CORONARY ARTERY BYPASS GRAFT N/A 05/09/2017   Procedure: CORONARY ARTERY BYPASS GRAFTING (CABG) x 3 using left internal mammary artery and right greater saphenous vein harvested endoscopically;  Surgeon: Ivin Poot, MD;  Location: [place];  Service: Open Heart Surgery;  Laterality: N/A;  . INTRAOPERATIVE TRANSESOPHAGEAL ECHOCARDIOGRAM N/A 05/09/2017   Procedure: INTRAOPERATIVE TRANSESOPHAGEAL ECHOCARDIOGRAM;  Surgeon: Ivin Poot, MD;  Location: [place];  Service: Open Heart Surgery;  Laterality: N/A;  . IR RADIOLOGIST EVAL & MGMT  12/04/2020  . LEFT HEART CATH AND CORONARY ANGIOGRAPHY N/A 05/02/2017   Procedure: Left Heart Cath and Coronary Angiography;  Surgeon: Wellington Hampshire, MD;  Location: [place] [facility];  Service: Cardiovascular;  Laterality: N/A;  . LOWER EXTREMITY ANGIOGRAPHY Left 02/09/2021   Procedure: LOWER EXTREMITY ANGIOGRAPHY;  Surgeon: Algernon Huxley, MD;  Location: [place] [facility];  Service: Cardiovascular;   Laterality: Left;  . LOWER EXTREMITY ANGIOGRAPHY Right 02/16/2021   Procedure: LOWER EXTREMITY ANGIOGRAPHY;  Surgeon: Algernon Huxley, MD;  Location: [place] [facility];  Service: Cardiovascular;  Laterality: Right;    Allergies  Allergies  Allergen Reactions  . Flexeril [Cyclobenzaprine] Hypertension    History of Present Illness    Carrie Kane is a 80 y.o. female with PMH as above. She was seen in clinic in 2018 with CP and a, she bnormal ETT. She underwent diagnostic LHC 04/2017 that showed significant multivessel dz, including severe LM dz. She  underwent CABG x3 on 05/09/2017 (LIMA to LAD, SVG to Diag, SVG to OM).  She was seen in office 12/25/19 by her primary cardiologist, Dr Rockey Situ. At that time, she reported that she was active taking care of the house. She had occasional palpitations, sometimes for 3-4 days in a row. She was taking metoprolol on a PRN basis.   Seen 07/2020 and BP 168/80, though she did not take her metoprolol that morning. She reported only taking her BB when at home and would avoid it if she knew show would be driving or running errands.  She reported dizziness on her metoprolol and worried that she may fall.  That morning, she took all of her medications at 9 AM, except for her metoprolol.  Home BP reported as SBP 132-136 and DBP 72-74.  On review of EMR, she was on carvedilol 6.25 mg twice daily in the past, but she was uncertain  why it was discontinued.  She was transitioned from metoprolol to Coreg, which she tolerated very well and without further dizziness. Benazepril - HCTZ was discontinued, so that benazepril could be increased to benazepril 40mg  daily. She was then continued on a separate HCTZ 12.5mg  daily.   Seen 02/13/2021 with BP 180 over 80.  She reported hearing her elevated blood pressure in her ears.  She had lower extremity edema with recent lower extremity vascular procedures performed.  She was urinating 4 times per day and once at night.  She was  tolerating Coreg and reported medication compliance.  She reported home stressors, and wondered if this was contributing to her BP.  Clonidine 0.1mg  x1 administered during clinic to ensure BP well controlled for pt drive home.    Regarding her usual medications, recommendation was to stop benazepril and start valsartan 160 mg daily.  She could continue to take clonidine for SBP higher than 150.  It was requested that, after 1 week on valsartan 160 mg daily, she call the office and let us know what her BP has been since that time.  If still taking clonidine to control BP, recommendation would be to increase to valsartan 320 mg daily.  Recommendation was to wean off of clonidine once able to do so and with escalation of valsartan as outlined above.  Today, 03/06/2021, she returns to clinic and notes she has been doing well since her last visit.  BP today improved from previous visit and 158/64, though still suboptimal.  She denies any chest pain or shortness of breath at rest or with exertion.  She is able to do regular walking and light house work without concerning symptoms.  She brings her BP log with her today and as typed directly below.  On average, SBP 120s to 160s with DBP 60s to 90s and HR 50 to 60s with rare rates into the 80s.  As below, she reports taking clonidine at 4:30 AM after waking up with elevated BP and feeling of racing heart rate.  Shortly after taking her clonidine, BP improved.  She reports that she snores with recommendation for watch Pat study, as untreated sleep apnea can contribute to BP elevations.  Stop bang performed and as below.  No signs or symptoms of bleeding.  She reports medication compliance.  Diet and lifestyle changes discussed.  BP log: Wed 163/81 123/62, 169/92 (woke her up - 430AM could hear heart racing) Thrs 126/65 (after clonidine at 430am) 152/72 123/59 135/71  Hr 50-60s normally but up to 80 at times  STOP-BANG Rex assessment  S-snore  Have you been  told that you snore?   1  T-tired Are you often tired, fatigued, sleepy during the day?   0  O-obstruction Do you stop breathing, choke, or gasp during sleep?   0  P-pressure Do you have or are you being treated for high blood pressure?  1  B- BMI Is your BMI greater than 35 kg/m?  0  A- age Are you 63 years or older?  1  N-neck Do you have a neck circumference greater than 16 inches?  0  G-gender Are you female?   0  Total  3   Risk Score <3 Low risk of OSA ?3 High risk of OSA   Home Medications    Current Outpatient Medications on File Prior to Visit  Medication Sig Dispense Refill  . aspirin EC 81 MG tablet Take 1 tablet (81 mg total) by mouth daily. 90 tablet  3  . atorvastatin (LIPITOR) 40 MG tablet Take 1 tablet (40 mg total) by mouth daily. 90 tablet 3  . BD INSULIN SYRINGE U/F 30G X 1/2" 0.5 ML MISC     . carvedilol (COREG) 3.125 MG tablet Take 1 tablet (3.125 mg total) by mouth 2 (two) times daily with a meal. 180 tablet 2  . Chlorpheniramine Maleate (ALLERGY PO) Take 1 tablet by mouth daily as needed (allergies).    . cholecalciferol (VITAMIN D) 1000 units tablet Take 2,000 Units by mouth daily at 12 noon.     . cloNIDine (CATAPRES) 0.2 MG tablet Take 1 tablet (0.2 mg total) by mouth 2 (two) times daily. 90 tablet 3  . clopidogrel (PLAVIX) 75 MG tablet TAKE 1 TABLET DAILY 90 tablet 3  . ferrous sulfate 325 (65 FE) MG EC tablet Take 1 tablet (325 mg total) by mouth daily at 2 PM. 90 tablet 3  . HUMALOG KWIKPEN 100 UNIT/ML KwikPen INJECT 38 UNITS UNDER THE SKIN DAILY 45 mL 2  . HUMALOG MIX 75/25 KWIKPEN (75-25) 100 UNIT/ML Kwikpen     . insulin glargine (LANTUS) 100 UNIT/ML injection INJECT 45 UNITS UNDER THE SKIN AT BEDTIME 40 mL 3  . levothyroxine (SYNTHROID) 100 MCG tablet Take 1 tablet (100 mcg total) by mouth daily before breakfast. 90 tablet 3  . Multiple Vitamin (MULTIVITAMIN) capsule Take 1 capsule by mouth daily at 12 noon.     . nitroGLYCERIN (NITROSTAT) 0.4 MG  SL tablet Place 1 tablet (0.4 mg total) under the tongue every 5 (five) minutes as needed for chest pain. 90 tablet 3  . Polyethyl Glycol-Propyl Glycol (SYSTANE OP) Apply 1 drop to eye as needed (dry eyes).    . valsartan (DIOVAN) 160 MG tablet Take 1 tablet (160 mg total) by mouth daily. 30 tablet 3  . hydrochlorothiazide (MICROZIDE) 12.5 MG capsule Take 1 capsule (12.5 mg total) by mouth daily. 90 capsule 2   No current facility-administered medications on file prior to visit.    Review of Systems    She denies chest pain, palpitations, dyspnea, pnd, orthopnea, n, v, syncope, edema, weight gain, or early satiety. She denies any dizziness.  At times she feels her heart racing or her BP elevated in the ears.  She reports a recent episode of elevated BP and racing heart rate that woke her from sleep.  She reports snoring.  All other systems reviewed and are otherwise negative except as noted above.  Physical Exam    VS:  BP (!) 158/64 (BP Location: Left Arm, Patient Position: Sitting, Cuff Size: Normal)   Pulse (!) 58   Ht 5\' 8"  (1.727 m)   Wt 160 lb (72.6 kg)   SpO2 98%   BMI 24.33 kg/m  , BMI Body mass index is 24.33 kg/m. GEN: Well nourished, well developed, in no acute distress. HEENT: normal. Neck: Supple, no JVD, carotid bruits, or masses. Cardiac: Bradycardic but regular, 1/6 systolic murmur. No rubs, or gallops. No clubbing, cyanosis, edema.  Radials/DP/PT 2+ and equal bilaterally.  Respiratory:  Respirations regular and unlabored, clear to auscultation bilaterally. GI: Soft, nontender, nondistended, BS + x 4. MS: no deformity or atrophy. Skin: warm and dry, no rash. Neuro:  Strength and sensation are intact. Psych: Normal affect.  Accessory Clinical Findings    ECG personally reviewed by me today -no EKG  VITALS Reviewed today   Temp Readings from Last 3 Encounters:  02/16/21 98.1 F (36.7 C) (Oral)  02/09/21 98.3 F (36.8  C) (Oral)  06/25/20 97.6 F (36.4 C)  (Tympanic)   BP Readings from Last 3 Encounters:  03/06/21 (!) 158/64  03/03/21 (!) 160/80  02/16/21 (!) 180/75   Pulse Readings from Last 3 Encounters:  03/06/21 (!) 58  03/03/21 (!) 56  02/16/21 78    Wt Readings from Last 3 Encounters:  03/06/21 160 lb (72.6 kg)  03/03/21 159 lb 9.6 oz (72.4 kg)  02/16/21 164 lb 0.4 oz (74.4 kg)     LABS  reviewed today   Lab Results  Component Value Date   WBC 5.5 08/11/2020   HGB 12.0 08/11/2020   HCT 37.3 08/11/2020   MCV 93 08/11/2020   PLT 227 08/11/2020   Lab Results  Component Value Date   CREATININE 0.83 02/16/2021   BUN 20 02/16/2021   NA 136 08/19/2020   K 4.2 08/19/2020   CL 97 (L) 08/19/2020   CO2 26 08/19/2020   No results found for: ALT, AST, GGT, ALKPHOS, BILITOT Lab Results  Component Value Date   CHOL 132 08/11/2020   HDL 68 08/11/2020   LDLCALC 49 08/11/2020   LDLDIRECT 52 08/11/2020   TRIG 79 08/11/2020   CHOLHDL 1.9 08/11/2020    Lab Results  Component Value Date   HGBA1C 9.1 (H) 02/20/2021   Lab Results  Component Value Date   TSH 0.372 05/03/2017     STUDIES/PROCEDURES reviewed today   Vas [place] LE arterial bilateral Summary:  Right: Atherosclerosis in the common femoral, femoral, popliteal and  tibial arteries.  Posterior tibial artery appears to be occluded in the mid and distal  protion.  Peroneal artery appears to be occluded in the mid portion and  reconstitutes distally.  Left: Atherosclerosis in the common femoral, femoral, popliteal and tibial  arteries.  Posterior tibial artery appears to be occluded in the mid and distal  protion.  Peroneal artery appears to be occluded in the distal portion.   ABIs/vascular 11/2020 Summary:  Right: Resting right ankle-brachial index indicates moderate right lower  extremity arterial disease.  The right toe-brachial index is abnormal. PPG tracings appear dampened.  Left: Resting left ankle-brachial index indicates moderate left lower   extremity arterial disease.  The left toe-brachial index is abnormal. PPG tracings appear dampened.  Summary:  Right: Atherosclerosis in the common femoral, femoral, popliteal and  tibial arteries.  Posterior tibial artery appears to be occluded in the mid and distal  protion.  Peroneal artery appears to be occluded in the mid portion and  reconstitutes distally.  Left: Atherosclerosis in the common femoral, femoral, popliteal and tibial  arteries.  Posterior tibial artery appears to be occluded in the mid and distal  protion.  Peroneal artery appears to be occluded in the distal portion.   Echo 08/2020 1. Left ventricular ejection fraction, by estimation, is 55 to 60%. The  left ventricle has normal function. The left ventricle has no regional  wall motion abnormalities. Left ventricular diastolic parameters are  consistent with Grade II diastolic  dysfunction (pseudonormalization).  2. Right ventricular systolic function is normal. The right ventricular  size is mildly enlarged. There is normal pulmonary artery systolic  pressure. The estimated right ventricular systolic pressure is 73.7 mmHg.  3. Left atrial size was moderately dilated.  4. Right atrial size was mildly dilated.  5. The mitral valve is normal in structure. No evidence of mitral valve  regurgitation. No evidence of mitral stenosis. Moderate mitral annular  calcification.  6. Tricuspid valve regurgitation is moderate.  Echo TEE 05/09/2017 Left ventricle: Normal cavity size. Concentric hypertrophy of severe  severity. LV systolic function is low normal with an EF of 50-55%. There  are no obvious wall motion abnormalities.  No change post procedure, with normal LV function and no RWMA.   [place] Arms, legs, carotids 05/05/2017 - Bilateral - 1% to 39% ICA stenosis. Vertebral artey flow is  antegrade.  - Palmar arch evaluation - Doppler waveforms remained normal  bilaterally with both radial and ulnar  compressions.  - ABIs on the right indicate a moderate reduction in arterial flow  at rest. The left ABI could not be ascertained due to non  compressible arteries possible secondary to calcification.    Echo 05/03/2017 - Left ventricle: The cavity size was normal. Systolic function was  normal. The estimated ejection fraction was in the range of 55%  to 60%. Wall motion was normal; there were no regional wall  motion abnormalities. Doppler parameters are consistent with  abnormal left ventricular relaxation (grade 1 diastolic  dysfunction).  - Mitral valve: Moderately calcified annulus.  - Left atrium: The atrium was mildly dilated.  - Pulmonary arteries: Systolic pressure was mildly increased.  LHC 05/02/2017  LM lesion, 80 %stenosed.  Ost Cx to Prox Cx lesion, 95 %stenosed.  Mid Cx lesion, 70 %stenosed.  Mid LAD lesion, 30 %stenosed.  Prox LAD lesion, 80 %stenosed.  There is mild left ventricular systolic dysfunction.  LV end diastolic pressure is moderately elevated.  The left ventricular ejection fraction is 45-50% by visual estimate. 1. Severe heavily calcified calcified left main stenosis extending into the ostium of the left circumflex with significant proximal LAD stenosis. Diffuse diabetic branch disease. 2. Mildly reduced LV systolic function with an EF of 45-50% with moderate mid to distal anterior and apical hypokinesis. 3. Severely elevated systemic hypertension with moderately elevated left ventricular end-diastolic pressure. Recommendations: Given the patient's cath findings, severely elevated blood pressure with chest pain, recommend hospital admission for blood pressure control and revascularization. Recommend CABG. I switched labetalol to carvedilol. The patient is not truly allergic to aspirin but she reports previous retinal bleeding on antiplatelet medications. I'm going to start small dose aspirin 81 mg once daily as I feel that the benefits  outweigh the risks at this time.   Assessment & Plan   CAD s/p CABG  --No concerning symptoms for angina.  She has not needed her nitro.  Continue medical management with ASA, BB, and statin. Continue lifestyle changes and aggressive risk factor control.   Chronic HFpEF -No s/sx of volume overload.  Euvolemic and well compensated on exam.  Most recent echo above reviewed with EF bl and G2DD, as well as RVSP 30.13mmHg, mild RVE/RAE, and moderate LAE.  Agreeable to watch Pat today with STOP-BANG as above.  Recommend increase to valsartan 320 mg daily.  Continue increased dose ARB with current dose BB, and HCTZ..  Essential hypertension, goal BP <130/80 -BP improved but still not at goal with most recent BP 158/64.  Will increase to valsartan 320mg  daily with hopes that this dose will allow [place] to wean her off of clonidine. Reviewed low salt diet and total intake under 2L daily. Continue to monitor BP at home.  Of note, CCB deferred in the past due to possible side effect of LEE s/p edema after LE intervention by [facility] as noted at past clinic visit.  Repeat BMET within the next month.  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 Continue current medications as above  and with dose increase to valsartan as above. Periodic echo to monitor.    Tachypalpitations -She notes racing heart rate that recently woke her from sleep at 4 AM.  Previously prescribed metoprolol, which caused her dizziness, and so this was changed to carvedilol 3.125 mg twice daily, which she has tolerated well. Continue Coreg.  Bilateral carotid disease PAD with intermittent claudication -->01/2021 LLE angiography and intervention per [facility]/ plan for RLE angiography 02/2021 --History of LE arterial dz and bilateral carotid dz. No reported s/sx of worsening carotid dz today.  01/2021 LLE angiography and percutaneous transluminal angioplasty of L anterior tibial artery per [facility] with repeat intervention this month for intermittent  claudication and due to arterial studies as above. In the meantime, recommend ongoing risk factor modification. Continue current ASA and Plavix.  Continue cholesterol control with atorvastatin 40mg  qd. Further recommendations per [facility].  HLD, goal below 70 -Continue current atorvastatin 40 mg.  She follows with her PCP for updated lipids each year.  LDL goal below 70 recommended.  Most recent LDL 49 and well controlled.   IDDM -Continue to monitor glycemic levels for risk factor modification.  Per PCP.  Ongoing lifestyle and diet changes recommended.  Medication changes: Increase to valsartan 320mg  to allow [place] to discontinue clonidine moving forward.  We have been using clonidine PRN for SBP 150 or higher -otherwise avoid use. If further BP support needed, could consider changing to [facility] per recent guidelines.  CCB avoided due to possible side effect of LEE.   Labs ordered:, BMET today, BMET within the next month at [place] / Imaging ordered: Watch Pat Disposition: RTC 3 months   Arvil Chaco, PA-C

## 2021-03-06 ENCOUNTER — Encounter: Payer: Self-pay | Admitting: Physician Assistant

## 2021-03-06 ENCOUNTER — Other Ambulatory Visit: Payer: Self-pay

## 2021-03-06 ENCOUNTER — Ambulatory Visit: Payer: Medicare Other | Admitting: Physician Assistant

## 2021-03-06 VITALS — BP 158/64 | HR 58 | Ht 68.0 in | Wt 160.0 lb

## 2021-03-06 DIAGNOSIS — I071 Rheumatic tricuspid insufficiency: Secondary | ICD-10-CM

## 2021-03-06 DIAGNOSIS — I739 Peripheral vascular disease, unspecified: Secondary | ICD-10-CM

## 2021-03-06 DIAGNOSIS — G4730 Sleep apnea, unspecified: Secondary | ICD-10-CM | POA: Diagnosis not present

## 2021-03-06 DIAGNOSIS — I2510 Atherosclerotic heart disease of native coronary artery without angina pectoris: Secondary | ICD-10-CM

## 2021-03-06 DIAGNOSIS — Z794 Long term (current) use of insulin: Secondary | ICD-10-CM

## 2021-03-06 DIAGNOSIS — Z79899 Other long term (current) drug therapy: Secondary | ICD-10-CM | POA: Diagnosis not present

## 2021-03-06 DIAGNOSIS — E119 Type 2 diabetes mellitus without complications: Secondary | ICD-10-CM

## 2021-03-06 DIAGNOSIS — I25118 Atherosclerotic heart disease of native coronary artery with other forms of angina pectoris: Secondary | ICD-10-CM

## 2021-03-06 DIAGNOSIS — E785 Hyperlipidemia, unspecified: Secondary | ICD-10-CM

## 2021-03-06 DIAGNOSIS — R009 Unspecified abnormalities of heart beat: Secondary | ICD-10-CM

## 2021-03-06 DIAGNOSIS — Z951 Presence of aortocoronary bypass graft: Secondary | ICD-10-CM

## 2021-03-06 DIAGNOSIS — I10 Essential (primary) hypertension: Secondary | ICD-10-CM | POA: Diagnosis not present

## 2021-03-06 MED ORDER — VALSARTAN 320 MG PO TABS: 320.0000 mg | ORAL_TABLET | Freq: Every day | ORAL | 3 refills | Status: DC

## 2021-03-06 NOTE — Patient Instructions (Signed)
Medication Instructions:  Your physician has recommended you make the following change in your medication:   INCREASE Valsartan 320mg  daily - An Rx has been sent to your mail order pharmacy for 90 day supply with 3 refills  *If you need a refill on your cardiac medications before your next appointment, please call your pharmacy*   Lab Work:  1)  Your physician recommends that you have lab work TODAY: BMET -  Please go to the [facility]. You will check in at the front desk to the right as you walk into the atrium. Valet Parking is offered if needed. - No appointment needed. You may go any day between 7 am and 6 pm.  2) Your physician recommends that you return for lab work in: 1 month      A written lab order was given to you today in the clinic to have these drawn at [facility].    If you have labs (blood work) drawn today and your tests are completely normal, you will receive your results only by: Marland Kitchen MyChart Message (if you have MyChart) OR . A paper copy in the mail If you have any lab test that is abnormal or we need to change your treatment, we will call you to review the results.   Testing/Procedures: WatchPAT?  Is a FDA cleared portable home sleep study test that uses a watch and 3 points of contact to monitor 7 different channels, including your heart rate, oxygen saturations, body position, snoring, and chest motion.  The study is easy to use from the comfort of your own home and accurately detect sleep apnea.  Before bed, you attach the chest sensor, attached the sleep apnea bracelet to your nondominant hand, and attach the finger probe.  After the study, the raw data is downloaded from the watch and scored for apnea events.   For more information: [email or web address]   Follow-Up: At [facility], you and your health needs are our priority.  As part of our continuing mission to provide you with exceptional heart care, we have created designated  Provider Care Teams.  These Care Teams include your primary Cardiologist (physician) and Advanced Practice Providers (APPs -  Physician Assistants and Nurse Practitioners) who all work together to provide you with the care you need, when you need it.  We recommend signing up for the patient portal called "MyChart".  Sign up information is provided on this After Visit Summary.  MyChart is used to connect with patients for Virtual Visits (Telemedicine).  Patients are able to view lab/test results, encounter notes, upcoming appointments, etc.  Non-urgent messages can be sent to your provider as well.   To learn more about what you can do with MyChart, go to [email or web address].    Your next appointment:   3 month(s)  The format for your next appointment:   In Person  Provider:   You may see Ida Rogue, MD or one of the following Advanced Practice Providers on your designated Care Team:     Marrianne Mood, [place]

## 2021-03-08 ENCOUNTER — Encounter: Payer: Self-pay | Admitting: Physician Assistant

## 2021-03-10 ENCOUNTER — Other Ambulatory Visit: Payer: Self-pay | Admitting: *Deleted

## 2021-03-10 ENCOUNTER — Other Ambulatory Visit (INDEPENDENT_AMBULATORY_CARE_PROVIDER_SITE_OTHER): Payer: Self-pay | Admitting: Vascular Surgery

## 2021-03-10 ENCOUNTER — Other Ambulatory Visit: Payer: Self-pay | Admitting: Physician Assistant

## 2021-03-10 DIAGNOSIS — I70239 Atherosclerosis of native arteries of right leg with ulceration of unspecified site: Secondary | ICD-10-CM

## 2021-03-10 DIAGNOSIS — Z9862 Peripheral vascular angioplasty status: Secondary | ICD-10-CM

## 2021-03-10 MED ORDER — HUMALOG MIX 75/25 KWIKPEN (75-25) 100 UNIT/ML ~~LOC~~ SUPN: 45.0000 [IU] | PEN_INJECTOR | Freq: Every day | SUBCUTANEOUS | 3 refills | Status: DC

## 2021-03-11 ENCOUNTER — Ambulatory Visit (INDEPENDENT_AMBULATORY_CARE_PROVIDER_SITE_OTHER): Payer: Medicare Other

## 2021-03-11 ENCOUNTER — Encounter (INDEPENDENT_AMBULATORY_CARE_PROVIDER_SITE_OTHER): Payer: Self-pay | Admitting: Nurse Practitioner

## 2021-03-11 ENCOUNTER — Ambulatory Visit (INDEPENDENT_AMBULATORY_CARE_PROVIDER_SITE_OTHER): Payer: Medicare Other | Admitting: Nurse Practitioner

## 2021-03-11 ENCOUNTER — Other Ambulatory Visit: Payer: Self-pay

## 2021-03-11 VITALS — BP 233/77 | HR 61 | Resp 16 | Ht 68.0 in | Wt 159.0 lb

## 2021-03-11 DIAGNOSIS — I70239 Atherosclerosis of native arteries of right leg with ulceration of unspecified site: Secondary | ICD-10-CM | POA: Diagnosis not present

## 2021-03-11 DIAGNOSIS — E119 Type 2 diabetes mellitus without complications: Secondary | ICD-10-CM | POA: Diagnosis not present

## 2021-03-11 DIAGNOSIS — Z9862 Peripheral vascular angioplasty status: Secondary | ICD-10-CM

## 2021-03-11 DIAGNOSIS — I10 Essential (primary) hypertension: Secondary | ICD-10-CM | POA: Diagnosis not present

## 2021-03-11 DIAGNOSIS — Z794 Long term (current) use of insulin: Secondary | ICD-10-CM

## 2021-03-11 DIAGNOSIS — E782 Mixed hyperlipidemia: Secondary | ICD-10-CM | POA: Diagnosis not present

## 2021-03-11 LAB — BASIC METABOLIC PANEL
BUN/Creatinine Ratio: 22 (ref 12–28)
BUN: 19 mg/dL (ref 8–27)
CO2: 26 mmol/L (ref 20–29)
Calcium: 9.3 mg/dL (ref 8.7–10.3)
Chloride: 99 mmol/L (ref 96–106)
Creatinine, Ser: 0.87 mg/dL (ref 0.57–1.00)
Glucose: 137 mg/dL — ABNORMAL HIGH (ref 65–99)
Potassium: 4 mmol/L (ref 3.5–5.2)
Sodium: 139 mmol/L (ref 134–144)
eGFR: 68 mL/min/{1.73_m2} (ref >59)

## 2021-03-11 NOTE — Progress Notes (Signed)
Subjective:    Patient ID: Carrie Kane, female    DOB: 1941-06-28, 80 y.o.   MRN: [id number] Chief Complaint  Patient presents with  . Follow-up    ultrasound    The patient returns to the office for followup and review of the noninvasive studies. There have been no interval changes in lower extremity symptoms. No interval shortening of the patient's claudication distance or development of rest pain symptoms. No new ulcers or wounds have occurred since the last visit.  There have been no significant changes to the patient's overall health care.  The patient denies amaurosis fugax or recent TIA symptoms. There are no recent neurological changes noted. The patient denies history of DVT, PE or superficial thrombophlebitis. The patient denies recent episodes of angina or shortness of breath.   ABI Rt=1.12 and Lt=1.09  (previous ABI's Rt=0.78 and Lt=0.76) Duplex ultrasound of the right tibial arteries reveals biphasic waveforms with a left monophasic waveforms.  The right foot has good toe waveforms whereas the left has diminished toe waveforms.   Review of Systems  Cardiovascular: Negative for leg swelling.  Skin: Negative for wound.  All other systems reviewed and are negative.      Objective:   Physical Exam Vitals reviewed.  HENT:     Head: Normocephalic.  Cardiovascular:     Rate and Rhythm: Normal rate.     Pulses:          Dorsalis pedis pulses are detected w/ Doppler on the right side and 1+ on the left side.       Posterior tibial pulses are detected w/ Doppler on the right side and 1+ on the left side.  Pulmonary:     Effort: Pulmonary effort is normal.  Neurological:     Mental Status: She is alert and oriented to person, place, and time.  Psychiatric:        Mood and Affect: Mood normal.        Behavior: Behavior normal.        Thought Content: Thought content normal.        Judgment: Judgment normal.     BP (!) 233/77 (BP Location: Right Arm)   Pulse 61    Resp 16   Ht 5\' 8"  (1.727 m)   Wt 159 lb (72.1 kg)   BMI 24.18 kg/m   Past Medical History:  Diagnosis Date  . CAD (coronary artery disease)    a. 04/2017 Cath: LM 80, LAD 80p, 40m, LCX 95ost, 47m, EF 45-50%; b. 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  . Diastolic dysfunction    a. 04/2017 Echo: EF 55-60%, no rwma, Gr1 DD, mildly dil LA.  Marland Kitchen Hyperlipidemia   . Hypertensive heart disease   . Insulin dependent diabetes mellitus     Social History   Socioeconomic History  . Marital status: Married    Spouse name: [name]   . Number of children: 1  . Years of education: Not on file  . Highest education level: Not on file  Occupational History  . Not on file  Tobacco Use  . Smoking status: Never Smoker  . Smokeless tobacco: Never Used  Substance and Sexual Activity  . Alcohol use: No  . Drug use: No  . Sexual activity: Not on file  Other Topics Concern  . Not on file  Social History Narrative   Lives at home with spouse    Social Determinants of Health   Financial Resource Strain: Not on file  Food Insecurity: Not on file  Transportation Needs: Not on file  Physical Activity: Not on file  Stress: Not on file  Social Connections: Not on file  Intimate Partner Violence: Not on file    Past Surgical History:  Procedure Laterality Date  . ABDOMINAL HYSTERECTOMY    . CORONARY ARTERY BYPASS GRAFT N/A 05/09/2017   Procedure: CORONARY ARTERY BYPASS GRAFTING (CABG) x 3 using left internal mammary artery and right greater saphenous vein harvested endoscopically;  Surgeon: Ivin Poot, MD;  Location: [place];  Service: Open Heart Surgery;  Laterality: N/A;  . INTRAOPERATIVE TRANSESOPHAGEAL ECHOCARDIOGRAM N/A 05/09/2017   Procedure: INTRAOPERATIVE TRANSESOPHAGEAL ECHOCARDIOGRAM;  Surgeon: Ivin Poot, MD;  Location: [place];  Service: Open Heart Surgery;  Laterality: N/A;  . IR RADIOLOGIST EVAL & MGMT  12/04/2020  . LEFT HEART CATH AND CORONARY ANGIOGRAPHY N/A  05/02/2017   Procedure: Left Heart Cath and Coronary Angiography;  Surgeon: Wellington Hampshire, MD;  Location: [place] [facility];  Service: Cardiovascular;  Laterality: N/A;  . LOWER EXTREMITY ANGIOGRAPHY Left 02/09/2021   Procedure: LOWER EXTREMITY ANGIOGRAPHY;  Surgeon: Algernon Huxley, MD;  Location: [place] [facility];  Service: Cardiovascular;  Laterality: Left;  . LOWER EXTREMITY ANGIOGRAPHY Right 02/16/2021   Procedure: LOWER EXTREMITY ANGIOGRAPHY;  Surgeon: Algernon Huxley, MD;  Location: [place] [facility];  Service: Cardiovascular;  Laterality: Right;    Family History  Problem Relation Age of Onset  . Breast cancer Maternal Aunt     Allergies  Allergen Reactions  . Flexeril [Cyclobenzaprine] Hypertension    CBC Latest Ref Rng & Units 08/11/2020 06/25/2020 05/13/2017  WBC 3.4 - 10.8 x10E3/uL 5.5 4.7 10.2  Hemoglobin 11.1 - 15.9 g/dL 12.0 11.2(L) 10.3(L)  Hematocrit 34.0 - 46.6 % 37.3 33.7(L) 31.0(L)  Platelets 150 - 450 x10E3/uL 227 204 198      CMP     Component Value Date/Time   NA 136 08/19/2020 1515   NA 141 08/11/2020 1058   K 4.2 08/19/2020 1515   CL 97 (L) 08/19/2020 1515   CO2 26 08/19/2020 1515   GLUCOSE 282 (H) 08/19/2020 1515   BUN 20 02/16/2021 0757   BUN 17 08/11/2020 1058   CREATININE 0.83 02/16/2021 0757   CALCIUM 9.5 08/19/2020 1515   GFRNONAA >60 02/16/2021 0757   GFRAA 74 08/11/2020 1058     No results found.     Assessment & Plan:   1. 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  The patient does not voice lifestyle limiting changes at this point in time.  Noninvasive studies do not suggest clinically significant change.  No invasive studies, angiography or surgery at this time The patient should continue walking and begin a more formal exercise program.  The patient should  continue antiplatelet therapy and aggressive treatment of the lipid abnormalities  No changes in the patient's medications at this time  The patient should continue wearing graduated compression socks 10-15 mmHg strength to control the mild edema.    2. Mixed hyperlipidemia Continue statin as ordered and reviewed, no changes at this time   3. Essential hypertension It is noted that the patient's blood pressure is extremely elevated today.  Patient notes she has not had medication get.  Patient is advised to take her medication as soon as she returns home.  If she begins to have  a headache or vision changes she should call 911 or proceed directly to the emergency room.  Her blood pressure remains elevated she should call her primary care provider for further evaluation work-up.  4. Diabetes mellitus type 2, insulin dependent ([place]) Continue hypoglycemic medications as already ordered, these medications have been reviewed and there are no changes at this time.  Hgb A1C to be monitored as already arranged by primary service    Current Outpatient Medications on File Prior to Visit  Medication Sig Dispense Refill  . aspirin EC 81 MG tablet Take 1 tablet (81 mg total) by mouth daily. 90 tablet 3  . atorvastatin (LIPITOR) 40 MG tablet Take 1 tablet (40 mg total) by mouth daily. 90 tablet 3  . BD INSULIN SYRINGE U/F 30G X 1/2" 0.5 ML MISC     . carvedilol (COREG) 3.125 MG tablet Take 1 tablet (3.125 mg total) by mouth 2 (two) times daily with a meal. 180 tablet 2  . Chlorpheniramine Maleate (ALLERGY PO) Take 1 tablet by mouth daily as needed (allergies).    . cholecalciferol (VITAMIN D) 1000 units tablet Take 2,000 Units by mouth daily at 12 noon.     . cloNIDine (CATAPRES) 0.2 MG tablet Take 1 tablet (0.2 mg total) by mouth 2 (two) times daily. 90 tablet 3  . clopidogrel (PLAVIX) 75 MG tablet TAKE 1 TABLET DAILY 90 tablet 3  . ferrous sulfate 325 (65 FE) MG EC tablet Take 1 tablet (325 mg  total) by mouth daily at 2 PM. 90 tablet 3  . HUMALOG KWIKPEN 100 UNIT/ML KwikPen INJECT 38 UNITS UNDER THE SKIN DAILY 45 mL 2  . HUMALOG MIX 75/25 KWIKPEN (75-25) 100 UNIT/ML Kwikpen Inject 45 Units into the skin daily at 2 PM. 15 mL 3  . insulin glargine (LANTUS) 100 UNIT/ML injection INJECT 45 UNITS UNDER THE SKIN AT BEDTIME 40 mL 3  . levothyroxine (SYNTHROID) 100 MCG tablet Take 1 tablet (100 mcg total) by mouth daily before breakfast. 90 tablet 3  . Multiple Vitamin (MULTIVITAMIN) capsule Take 1 capsule by mouth daily at 12 noon.     . nitroGLYCERIN (NITROSTAT) 0.4 MG SL tablet Place 1 tablet (0.4 mg total) under the tongue every 5 (five) minutes as needed for chest pain. 90 tablet 3  . Polyethyl Glycol-Propyl Glycol (SYSTANE OP) Apply 1 drop to eye as needed (dry eyes).    . valsartan (DIOVAN) 320 MG tablet Take 1 tablet (320 mg total) by mouth daily. 90 tablet 3  . hydrochlorothiazide (MICROZIDE) 12.5 MG capsule Take 1 capsule (12.5 mg total) by mouth daily. 90 capsule 2   No current facility-administered medications on file prior to visit.    There are no Patient Instructions on file for this visit. No follow-ups on file.   Kris Hartmann, NP

## 2021-03-17 ENCOUNTER — Telehealth: Payer: Self-pay | Admitting: Internal Medicine

## 2021-03-17 NOTE — Telephone Encounter (Signed)
Arvil Chaco, PA-C  03/16/2021 8:09 AM EDT      Labs for increased ARB --Renal function stable --Electrolytes at goal --Glucose elevated with consideration that non-fasting labs  Continue current medications as prescribed

## 2021-03-17 NOTE — Telephone Encounter (Signed)
Attempted to call the patient. No answer- I left a detailed message of results on the patient's voice mail (ok per DPR). I asked that she call back with any further questions/ concerns.

## 2021-04-21 ENCOUNTER — Encounter: Payer: Self-pay | Admitting: Podiatry

## 2021-04-21 ENCOUNTER — Ambulatory Visit: Payer: Medicare Other | Admitting: Podiatry

## 2021-04-21 ENCOUNTER — Other Ambulatory Visit: Payer: Self-pay

## 2021-04-21 DIAGNOSIS — I739 Peripheral vascular disease, unspecified: Secondary | ICD-10-CM | POA: Diagnosis not present

## 2021-04-21 DIAGNOSIS — Z794 Long term (current) use of insulin: Secondary | ICD-10-CM

## 2021-04-21 DIAGNOSIS — E119 Type 2 diabetes mellitus without complications: Secondary | ICD-10-CM | POA: Diagnosis not present

## 2021-04-21 DIAGNOSIS — L97521 Non-pressure chronic ulcer of other part of left foot limited to breakdown of skin: Secondary | ICD-10-CM

## 2021-04-21 DIAGNOSIS — I999 Unspecified disorder of circulatory system: Secondary | ICD-10-CM

## 2021-04-21 NOTE — Progress Notes (Signed)
Subjective:  Patient ID: Carrie Kane, female    DOB: 11-27-1940,  MRN: [id number]  Chief Complaint  Patient presents with   Wound Check    Wound check     80 y.o. female presents for wound care.  Patient presents with follow-up to left submetatarsal 2 ulceration recurrence.  Patient states that she started noticing some pain.  She is a diabetic with now A1c of 9.1.  She also had a vascular intervention and with restoration of blood flow.  She denies any other acute complaints.  She would like to get it evaluated and treated   Review of Systems: Negative except as noted in the HPI. Denies N/V/F/Ch.  Past Medical History:  Diagnosis Date   CAD (coronary artery disease)    a. 04/2017 Cath: LM 55, LAD 80p, 45m, LCX 95ost, 69m, EF 45-50%; b. 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   Diastolic dysfunction    a. 04/2017 Echo: EF 55-60%, no rwma, Gr1 DD, mildly dil LA.   Hyperlipidemia    Hypertensive heart disease    Insulin dependent diabetes mellitus     Current Outpatient Medications:    aspirin EC 81 MG tablet, Take 1 tablet (81 mg total) by mouth daily., Disp: 90 tablet, Rfl: 3   atorvastatin (LIPITOR) 40 MG tablet, Take 1 tablet (40 mg total) by mouth daily., Disp: 90 tablet, Rfl: 3   BD INSULIN SYRINGE U/F 30G X 1/2" 0.5 ML MISC, , Disp: , Rfl:    carvedilol (COREG) 3.125 MG tablet, Take 1 tablet (3.125 mg total) by mouth 2 (two) times daily with a meal., Disp: 180 tablet, Rfl: 2   Chlorpheniramine Maleate (ALLERGY PO), Take 1 tablet by mouth daily as needed (allergies)., Disp: , Rfl:    cholecalciferol (VITAMIN D) 1000 units tablet, Take 2,000 Units by mouth daily at 12 noon. , Disp: , Rfl:    cloNIDine (CATAPRES) 0.2 MG tablet, Take 1 tablet (0.2 mg total) by mouth 2 (two) times daily., Disp: 90 tablet, Rfl: 3   clopidogrel (PLAVIX) 75 MG tablet, TAKE 1 TABLET DAILY, Disp: 90 tablet, Rfl: 3   ferrous sulfate 325 (65 FE) MG EC tablet, Take 1 tablet (325 mg total) by mouth  daily at 2 PM., Disp: 90 tablet, Rfl: 3   HUMALOG KWIKPEN 100 UNIT/ML KwikPen, INJECT 38 UNITS UNDER THE SKIN DAILY, Disp: 45 mL, Rfl: 2   HUMALOG MIX 75/25 KWIKPEN (75-25) 100 UNIT/ML Kwikpen, Inject 45 Units into the skin daily at 2 PM., Disp: 15 mL, Rfl: 3   hydrochlorothiazide (MICROZIDE) 12.5 MG capsule, Take 1 capsule (12.5 mg total) by mouth daily., Disp: 90 capsule, Rfl: 2   insulin glargine (LANTUS) 100 UNIT/ML injection, INJECT 45 UNITS UNDER THE SKIN AT BEDTIME, Disp: 40 mL, Rfl: 3   levothyroxine (SYNTHROID) 100 MCG tablet, Take 1 tablet (100 mcg total) by mouth daily before breakfast., Disp: 90 tablet, Rfl: 3   Multiple Vitamin (MULTIVITAMIN) capsule, Take 1 capsule by mouth daily at 12 noon. , Disp: , Rfl:    nitroGLYCERIN (NITROSTAT) 0.4 MG SL tablet, Place 1 tablet (0.4 mg total) under the tongue every 5 (five) minutes as needed for chest pain., Disp: 90 tablet, Rfl: 3   Polyethyl Glycol-Propyl Glycol (SYSTANE OP), Apply 1 drop to eye as needed (dry eyes)., Disp: , Rfl:    valsartan (DIOVAN) 320 MG tablet, Take 1 tablet (320 mg total) by mouth daily., Disp: 90 tablet, Rfl: 3  Social History   Tobacco Use  Smoking Status Never  Smokeless Tobacco Never    Allergies  Allergen Reactions   Flexeril [Cyclobenzaprine] Hypertension   Objective:  There were no vitals filed for this visit. There is no height or weight on file to calculate BMI. Constitutional Well developed. Well nourished.  Vascular Dorsalis pedis pulses faintly palpable bilaterally. Posterior tibial pulses faintly palpable bilaterally. Capillary refill normal to all digits.  No cyanosis or clubbing noted. Pedal hair growth normal.  Neurologic Normal speech. Oriented to person, place, and time. Protective sensation absent  Dermatologic Wound Location: Left submetatarsal 2 ulceration limited to the breakdown of the skin Wound Base: Granular/Healthy Peri-wound: Normal Exudate: None: wound tissue dry Wound  Measurements: -See below  Orthopedic: No pain to palpation either foot.   Radiographs: None Assessment:   1. PVD (peripheral vascular disease) ([facility])   2. Vascular abnormality   3. Diabetes mellitus type 2, insulin dependent ([place])   4. Ulcer of left foot, limited to breakdown of skin [facility])     Plan:  Patient was evaluated and treated and all questions answered.  Ulcer left submetatarsal 2 limited to the breakdown of the skin -Debridement as below. -Dressed with Betadine wet-to-dry, DSD. -Continue off-loading with surgical shoe. -Patient had recent vascular intervention done with restoration of flow. Left lower Extremity:  Fairly normal left common femoral artery, profunda femoris artery, and superficial femoral and popliteal arteries.  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  There was a fairly normal tibial trifurcation with heavily diseased and multiple segments of occlusion and a small anterior tibial artery which was continuous to the foot.  The peroneal artery had a moderate to long segment occlusion from the proximal segment but did reconstitute through collaterals in the mid to distal segment.  The posterior tibial artery occluded in the proximal segment and did not reconstitute distally.  The tibial vessels were seen better with a catheter placement more distal, and advanced a Kumpe catheter into the proximal anterior tibial artery to opacify distally to see that it was continuous to the foot prior to intervention.  Procedure: Excisional Debridement of Wound Tool: Sharp chisel blade/tissue nipper Rationale: Removal of non-viable soft tissue from the wound to promote healing.  Anesthesia: none Pre-Debridement Wound Measurements: 0.5 cm x 0.2 cm x 0.1 cm  Post-Debridement Wound Measurements: 0.6 cm x 0.2 cm x 2.1 cm  Type of Debridement: Sharp Excisional Tissue Removed: Non-viable soft tissue Blood loss: Minimal (<50cc) Depth of Debridement: subcutaneous tissue. Technique: Sharp  excisional debridement to bleeding, viable wound base.  Wound Progress: This is my initial evaluation.  I will continue monitor the progression and healing of it. Dressing: Dry, sterile, compression dressing. Disposition: Patient tolerated procedure well. Patient to return in 1 week for follow-up.  No follow-ups on file.

## 2021-04-29 ENCOUNTER — Other Ambulatory Visit: Payer: Self-pay | Admitting: Physician Assistant

## 2021-04-30 ENCOUNTER — Encounter: Payer: Self-pay | Admitting: Cardiovascular Disease

## 2021-04-30 ENCOUNTER — Other Ambulatory Visit: Payer: Self-pay

## 2021-04-30 DIAGNOSIS — I10 Essential (primary) hypertension: Secondary | ICD-10-CM

## 2021-04-30 DIAGNOSIS — I2510 Atherosclerotic heart disease of native coronary artery without angina pectoris: Secondary | ICD-10-CM

## 2021-04-30 DIAGNOSIS — Z79899 Other long term (current) drug therapy: Secondary | ICD-10-CM

## 2021-04-30 LAB — BASIC METABOLIC PANEL
BUN/Creatinine Ratio: 17 (ref 12–28)
BUN: 14 mg/dL (ref 8–27)
CO2: 27 mmol/L (ref 20–29)
Calcium: 9.8 mg/dL (ref 8.7–10.3)
Chloride: 103 mmol/L (ref 96–106)
Creatinine, Ser: 0.83 mg/dL (ref 0.57–1.00)
Glucose: 97 mg/dL (ref 65–99)
Potassium: 4.4 mmol/L (ref 3.5–5.2)
Sodium: 143 mmol/L (ref 134–144)
eGFR: 72 mL/min/{1.73_m2} (ref >59)

## 2021-04-30 MED ORDER — HYDROCHLOROTHIAZIDE 12.5 MG PO CAPS: 12.5000 mg | ORAL_CAPSULE | Freq: Every day | ORAL | 2 refills | Status: DC

## 2021-05-01 ENCOUNTER — Telehealth: Payer: Self-pay

## 2021-05-01 NOTE — Telephone Encounter (Signed)
Able to reach pt regarding her recent lab work, provider had a chance to review her results and advised   "Good news.  Kidney function stable, electrolytes at goal.  No additional recommendations from that of clinic"  Carrie Kane thankful for the call this am, not questions at this time, will call back for anything further.

## 2021-05-12 ENCOUNTER — Other Ambulatory Visit: Payer: Self-pay

## 2021-05-12 ENCOUNTER — Encounter: Payer: Self-pay | Admitting: Podiatry

## 2021-05-12 ENCOUNTER — Ambulatory Visit: Payer: Medicare Other | Admitting: Podiatry

## 2021-05-12 DIAGNOSIS — L97521 Non-pressure chronic ulcer of other part of left foot limited to breakdown of skin: Secondary | ICD-10-CM | POA: Diagnosis not present

## 2021-05-12 DIAGNOSIS — I739 Peripheral vascular disease, unspecified: Secondary | ICD-10-CM

## 2021-05-12 NOTE — Progress Notes (Signed)
Subjective:  Patient ID: Carrie Kane, female    DOB: 1941/02/13,  MRN: [id number]  Chief Complaint  Patient presents with   Wound Check    Wound check     80 y.o. female presents for wound care.  Patient presents with follow-up to left submetatarsal 2 ulceration recurrence.  Patient states that she started noticing some pain.  She is a diabetic with now A1c of 9.1.  She also had a vascular intervention and with restoration of blood flow.  She denies any other acute complaints.  She would like to get it evaluated and treated   Review of Systems: Negative except as noted in the HPI. Denies N/V/F/Ch.  Past Medical History:  Diagnosis Date   CAD (coronary artery disease)    a. 04/2017 Cath: LM 53, LAD 80p, 61m LCX 95ost, 717mEF 45-50%; b. 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   Diastolic dysfunction    a. 04/2017 Echo: EF 55-60%, no rwma, Gr1 DD, mildly dil LA.   Hyperlipidemia    Hypertensive heart disease    Insulin dependent diabetes mellitus     Current Outpatient Medications:    aspirin EC 81 MG tablet, Take 1 tablet (81 mg total) by mouth daily., Disp: 90 tablet, Rfl: 3   atorvastatin (LIPITOR) 40 MG tablet, Take 1 tablet (40 mg total) by mouth daily., Disp: 90 tablet, Rfl: 3   BD INSULIN SYRINGE U/F 30G X 1/2" 0.5 ML MISC, , Disp: , Rfl:    carvedilol (COREG) 3.125 MG tablet, Take 1 tablet (3.125 mg total) by mouth 2 (two) times daily with a meal., Disp: 180 tablet, Rfl: 2   Chlorpheniramine Maleate (ALLERGY PO), Take 1 tablet by mouth daily as needed (allergies)., Disp: , Rfl:    cholecalciferol (VITAMIN D) 1000 units tablet, Take 2,000 Units by mouth daily at 12 noon. , Disp: , Rfl:    cloNIDine (CATAPRES) 0.2 MG tablet, Take 1 tablet (0.2 mg total) by mouth 2 (two) times daily., Disp: 90 tablet, Rfl: 3   clopidogrel (PLAVIX) 75 MG tablet, TAKE 1 TABLET DAILY, Disp: 90 tablet, Rfl: 3   ferrous sulfate 325 (65 FE) MG EC tablet, Take 1 tablet (325 mg total) by mouth  daily at 2 PM., Disp: 90 tablet, Rfl: 3   HUMALOG KWIKPEN 100 UNIT/ML KwikPen, INJECT 38 UNITS UNDER THE SKIN DAILY, Disp: 45 mL, Rfl: 2   HUMALOG MIX 75/25 KWIKPEN (75-25) 100 UNIT/ML Kwikpen, Inject 45 Units into the skin daily at 2 PM., Disp: 15 mL, Rfl: 3   hydrochlorothiazide (MICROZIDE) 12.5 MG capsule, Take 1 capsule (12.5 mg total) by mouth daily., Disp: 90 capsule, Rfl: 2   insulin glargine (LANTUS) 100 UNIT/ML injection, INJECT 45 UNITS UNDER THE SKIN AT BEDTIME, Disp: 40 mL, Rfl: 3   levothyroxine (SYNTHROID) 100 MCG tablet, Take 1 tablet (100 mcg total) by mouth daily before breakfast., Disp: 90 tablet, Rfl: 3   Multiple Vitamin (MULTIVITAMIN) capsule, Take 1 capsule by mouth daily at 12 noon. , Disp: , Rfl:    nitroGLYCERIN (NITROSTAT) 0.4 MG SL tablet, Place 1 tablet (0.4 mg total) under the tongue every 5 (five) minutes as needed for chest pain., Disp: 90 tablet, Rfl: 3   Polyethyl Glycol-Propyl Glycol (SYSTANE OP), Apply 1 drop to eye as needed (dry eyes)., Disp: , Rfl:    valsartan (DIOVAN) 320 MG tablet, Take 1 tablet (320 mg total) by mouth daily., Disp: 90 tablet, Rfl: 3  Social History   Tobacco Use  Smoking Status Never  Smokeless Tobacco Never    Allergies  Allergen Reactions   Flexeril [Cyclobenzaprine] Hypertension   Objective:  There were no vitals filed for this visit. There is no height or weight on file to calculate BMI. Constitutional Well developed. Well nourished.  Vascular Dorsalis pedis pulses faintly palpable bilaterally. Posterior tibial pulses faintly palpable bilaterally. Capillary refill normal to all digits.  No cyanosis or clubbing noted. Pedal hair growth normal.  Neurologic Normal speech. Oriented to person, place, and time. Protective sensation absent  Dermatologic Wound Location: Left submetatarsal 2 ulceration limited to the breakdown of the skin Wound Base: Granular/Healthy Peri-wound: Normal Exudate: None: wound tissue dry Wound  Measurements: -See below  Orthopedic: No pain to palpation either foot.   Radiographs: None Assessment:   1. PVD (peripheral vascular disease) ([place])   2. Ulcer of left foot, limited to breakdown of skin [facility])      Plan:  Patient was evaluated and treated and all questions answered.  Ulcer left submetatarsal 2 limited to the breakdown of the skin -Debridement as below. -Dressed with Betadine wet-to-dry, DSD. -Continue off-loading with surgical shoe. -Patient had recent vascular intervention done with restoration of flow. Left lower Extremity:  Fairly normal left common femoral artery, profunda femoris artery, and superficial femoral and popliteal arteries.  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  There was a fairly normal tibial trifurcation with heavily diseased and multiple segments of occlusion and a small anterior tibial artery which was continuous to the foot.  The peroneal artery had a moderate to long segment occlusion from the proximal segment but did reconstitute through collaterals in the mid to distal segment.  The posterior tibial artery occluded in the proximal segment and did not reconstitute distally.  The tibial vessels were seen better with a catheter placement more distal, and advanced a Kumpe catheter into the proximal anterior tibial artery to opacify distally to see that it was continuous to the foot prior to intervention.  Procedure: Excisional Debridement of Wound Tool: Sharp chisel blade/tissue nipper Rationale: Removal of non-viable soft tissue from the wound to promote healing.  Anesthesia: none Pre-Debridement Wound Measurements: 0.4 cm x 0.2 cm x 0.1 cm  Post-Debridement Wound Measurements: 0.5 cm x 0.2 cm x 2.1 cm  Type of Debridement: Sharp Excisional Tissue Removed: Non-viable soft tissue Blood loss: Minimal (<50cc) Depth of Debridement: subcutaneous tissue. Technique: Sharp excisional debridement to bleeding, viable wound base.  Wound Progress: The wound is  improving and decreasing size slowly. Dressing: Dry, sterile, compression dressing. Disposition: Patient tolerated procedure well. Patient to return in 1 week for follow-up.  No follow-ups on file.

## 2021-06-05 ENCOUNTER — Other Ambulatory Visit (INDEPENDENT_AMBULATORY_CARE_PROVIDER_SITE_OTHER): Payer: Self-pay | Admitting: Nurse Practitioner

## 2021-06-05 DIAGNOSIS — Z9862 Peripheral vascular angioplasty status: Secondary | ICD-10-CM

## 2021-06-05 DIAGNOSIS — I7025 Atherosclerosis of native arteries of other extremities with ulceration: Secondary | ICD-10-CM

## 2021-06-08 ENCOUNTER — Other Ambulatory Visit (INDEPENDENT_AMBULATORY_CARE_PROVIDER_SITE_OTHER): Payer: Self-pay | Admitting: Nurse Practitioner

## 2021-06-08 DIAGNOSIS — Z9862 Peripheral vascular angioplasty status: Secondary | ICD-10-CM

## 2021-06-08 DIAGNOSIS — I7025 Atherosclerosis of native arteries of other extremities with ulceration: Secondary | ICD-10-CM

## 2021-06-09 ENCOUNTER — Encounter (INDEPENDENT_AMBULATORY_CARE_PROVIDER_SITE_OTHER): Payer: Medicare Other

## 2021-06-09 ENCOUNTER — Ambulatory Visit (INDEPENDENT_AMBULATORY_CARE_PROVIDER_SITE_OTHER): Payer: Medicare Other

## 2021-06-09 ENCOUNTER — Ambulatory Visit (INDEPENDENT_AMBULATORY_CARE_PROVIDER_SITE_OTHER): Payer: Medicare Other | Admitting: Nurse Practitioner

## 2021-06-09 ENCOUNTER — Other Ambulatory Visit: Payer: Self-pay

## 2021-06-09 DIAGNOSIS — I7025 Atherosclerosis of native arteries of other extremities with ulceration: Secondary | ICD-10-CM | POA: Diagnosis not present

## 2021-06-09 DIAGNOSIS — Z9862 Peripheral vascular angioplasty status: Secondary | ICD-10-CM

## 2021-06-11 ENCOUNTER — Ambulatory Visit: Payer: Medicare Other | Admitting: Podiatry

## 2021-06-22 NOTE — Progress Notes (Signed)
Cardiology Office Note  Date:  06/23/2021   ID:  Carrie Kane, DOB 1941-06-16, MRN [id number]  PCP:  Cletis Athens, MD   Chief Complaint  Patient presents with   3 month follow up     "Doing well." Medications reviewed by the patient verbally.     HPI:  Carrie Kane is a pleasant 80 year-old  with PMH of Diabetes, insulin-dependent nonsmoker Hypertension Worsening chest pain consistent with unstable angina Cardiac catheterization showing severe left main disease, three-vessel disease Coronary artery bypass grafting x3  05/09/2017 (left internal mammary artery to left anterior descending artery, saphenous vein graft to diagonal, saphenous vein graft to the obtuse marginal). Who presents for follow-up of her coronary disease, s/p CABG  Follows [facility],  Husband does not help her very much at home  Labs reviewed in the computer A1c 9.1 up from 8.4  Reports her blood pressure has been well controlled at home, elevated on today's visit which is surprising to her BP 120s/70 to 80s  Active at home, no regular exercise program  Followed by vein and vascular, Dr. Lucky Cowboy April and may 2022 underwent PV procedure Diagnosed with PAD with ulceration left lower extremity Percutaneous transluminal angioplasty of left anterior tibial artery with 2 mm diameter angioplasty balloon throughout, 2-1/2 and 3 mm diameter angioplasty balloon proximally Percutaneous transluminal angioplasty of right peroneal artery with 2.5 and 3 millimeters diameter angioplasty balloon Percutaneous transluminal angioplasty of right popliteal artery and most distal SFA with 5 mm diameter Lutonix drug-coated angioplasty balloon  Reports legs feel better  EKG personally reviewed by myself on todays visit Shows normal sinus rhythm rate 48 bpm right bundle branch block left anterior fascicular block  Other past medical history reviewed  cardiac catheterization LM lesion, 80 %stenosed. Ost Cx to Prox Cx lesion, 95  %stenosed. Mid Cx lesion, 70 %stenosed. Mid LAD lesion, 30 %stenosed. Prox LAD lesion, 80 %stenosed. There is mild left ventricular systolic dysfunction. LV end diastolic pressure is moderately elevated. The left ventricular ejection fraction is 45-50% by visual estimate.   1. Severe heavily calcified calcified left main stenosis extending into the ostium of the left circumflex with significant proximal LAD stenosis. Diffuse diabetic branch disease. 2. Mildly reduced LV systolic function with an EF of 45-50% with moderate mid to distal anterior and apical hypokinesis. 3. Severely elevated systemic hypertension with moderately elevated left ventricular end-diastolic pressure.   Echocardiogram done through primary care showing ejection fraction 45%, anteroseptal wall dyskinesia normal left atrial size, calcified mitral valve, diastolic dysfunction   PMH:   has a past medical history of CAD (coronary artery disease), Diastolic dysfunction, Hyperlipidemia, Hypertensive heart disease, and Insulin dependent diabetes mellitus.  PSH:    Past Surgical History:  Procedure Laterality Date   ABDOMINAL HYSTERECTOMY     CORONARY ARTERY BYPASS GRAFT N/A 05/09/2017   Procedure: CORONARY ARTERY BYPASS GRAFTING (CABG) x 3 using left internal mammary artery and right greater saphenous vein harvested endoscopically;  Surgeon: Ivin Poot, MD;  Location: [facility];  Service: Open Heart Surgery;  Laterality: N/A;   INTRAOPERATIVE TRANSESOPHAGEAL ECHOCARDIOGRAM N/A 05/09/2017   Procedure: INTRAOPERATIVE TRANSESOPHAGEAL ECHOCARDIOGRAM;  Surgeon: Ivin Poot, MD;  Location: [facility];  Service: Open Heart Surgery;  Laterality: N/A;   IR RADIOLOGIST EVAL & MGMT  12/04/2020   LEFT HEART CATH AND CORONARY ANGIOGRAPHY N/A 05/02/2017   Procedure: Left Heart Cath and Coronary Angiography;  Surgeon: Wellington Hampshire, MD;  Location: [facility];  Service: Cardiovascular;  Laterality:  N/A;   LOWER EXTREMITY  ANGIOGRAPHY Left 02/09/2021   Procedure: LOWER EXTREMITY ANGIOGRAPHY;  Surgeon: Algernon Huxley, MD;  Location: [facility];  Service: Cardiovascular;  Laterality: Left;   LOWER EXTREMITY ANGIOGRAPHY Right 02/16/2021   Procedure: LOWER EXTREMITY ANGIOGRAPHY;  Surgeon: Algernon Huxley, MD;  Location: [facility];  Service: Cardiovascular;  Laterality: Right;    Current Outpatient Medications  Medication Sig Dispense Refill   aspirin EC 81 MG tablet Take 1 tablet (81 mg total) by mouth daily. 90 tablet 3   atorvastatin (LIPITOR) 40 MG tablet Take 1 tablet (40 mg total) by mouth daily. 90 tablet 3   BD INSULIN SYRINGE U/F 30G X 1/2" 0.5 ML MISC      carvedilol (COREG) 3.125 MG tablet Take 1 tablet (3.125 mg total) by mouth 2 (two) times daily with a meal. 180 tablet 2   Chlorpheniramine Maleate (ALLERGY PO) Take 1 tablet by mouth daily as needed (allergies).     cholecalciferol (VITAMIN D) 1000 units tablet Take 2,000 Units by mouth daily at 12 noon.      cloNIDine (CATAPRES) 0.2 MG tablet Take 1 tablet (0.2 mg total) by mouth 2 (two) times daily. 90 tablet 3   clopidogrel (PLAVIX) 75 MG tablet TAKE 1 TABLET DAILY 90 tablet 3   ferrous sulfate 325 (65 FE) MG EC tablet Take 1 tablet (325 mg total) by mouth daily at 2 PM. 90 tablet 3   HUMALOG KWIKPEN 100 UNIT/ML KwikPen INJECT 38 UNITS UNDER THE SKIN DAILY 45 mL 2   HUMALOG MIX 75/25 KWIKPEN (75-25) 100 UNIT/ML Kwikpen Inject 45 Units into the skin daily at 2 PM. 15 mL 3   hydrochlorothiazide (MICROZIDE) 12.5 MG capsule Take 1 capsule (12.5 mg total) by mouth daily. 90 capsule 2   insulin glargine (LANTUS) 100 UNIT/ML injection INJECT 45 UNITS UNDER THE SKIN AT BEDTIME 40 mL 3   levothyroxine (SYNTHROID) 100 MCG tablet Take 1 tablet (100 mcg total) by mouth daily before breakfast. 90 tablet 3   Multiple Vitamin (MULTIVITAMIN) capsule Take 1 capsule by mouth daily at 12 noon.      nitroGLYCERIN (NITROSTAT) 0.4 MG SL tablet Place 1 tablet  (0.4 mg total) under the tongue every 5 (five) minutes as needed for chest pain. 90 tablet 3   Polyethyl Glycol-Propyl Glycol (SYSTANE OP) Apply 1 drop to eye as needed (dry eyes).     valsartan (DIOVAN) 320 MG tablet Take 1 tablet (320 mg total) by mouth daily. 90 tablet 3   No current facility-administered medications for this visit.     Allergies:   Flexeril [cyclobenzaprine]   Social History:  The patient  reports that she has never smoked. She has never used smokeless tobacco. She reports that she does not drink alcohol and does not use drugs.   Family History:   family history includes Breast cancer in her maternal aunt.    Review of Systems: Review of Systems  Constitutional: Negative.   HENT: Negative.    Respiratory: Negative.    Cardiovascular: Negative.   Gastrointestinal: Negative.   Musculoskeletal: Negative.   Neurological: Negative.   Psychiatric/Behavioral: Negative.    All other systems reviewed and are negative.  PHYSICAL EXAM: VS:  BP (!) 160/70 (BP Location: Left Arm, Patient Position: Sitting, Cuff Size: Normal)   Pulse (!) 48   Ht '5\' 8"'$  (1.727 m)   Wt 162 lb 6 oz (73.7 kg)   SpO2 99%   BMI 24.69  kg/m  , BMI Body mass index is 24.69 kg/m. Constitutional:  oriented to person, place, and time. No distress.  HENT:  Head: Grossly normal Eyes:  no discharge. No scleral icterus.  Neck: No JVD, no carotid bruits  Cardiovascular: Regular, bradycardic no murmurs appreciated Pulmonary/Chest: Clear to auscultation bilaterally, no wheezes or rails Abdominal: Soft.  no distension.  no tenderness.  Musculoskeletal: Normal range of motion Neurological:  normal muscle tone. Coordination normal. No atrophy Skin: Skin warm and dry Psychiatric: normal affect, pleasant   Recent Labs: 08/11/2020: Hemoglobin 12.0; Platelets 227 04/29/2021: BUN 14; Creatinine, Ser 0.83; Potassium 4.4; Sodium 143    Lipid Panel Lab Results  Component Value Date   CHOL 132  08/11/2020   HDL 68 08/11/2020   LDLCALC 49 08/11/2020   TRIG 79 08/11/2020      Wt Readings from Last 3 Encounters:  06/23/21 162 lb 6 oz (73.7 kg)  03/11/21 159 lb (72.1 kg)  03/06/21 160 lb (72.6 kg)       ASSESSMENT AND PLAN:  CAD, s/p CABG,  on aspirin and Plavix given left main disease, severe three-vessel disease  Currently with no symptoms of angina. No further workup at this time. Continue current medication regimen. Carotid stenosis Ultrasound 05/05/2017 less than 39% bilateral disease Medical management  Dizziness Minimal symptoms, heart rate low in the 40s, recommend she hold the carvedilol  Palpitations If symptoms recur could take carvedilol as needed Currently without symptoms  Diabetes mellitus type 2, insulin dependent (HCC) A1c poorly controlled, recommend she talk with primary care, compliance with her medications  Hyperlipidemia On statin, lipids ordered by primary care, will try to obtain these numbers when they are available Goal LDL less than 70  PAD Underwent intervention with Dr. Due to bilaterally Reports no claudication symptoms Goal LDL less than 70   Total encounter time more than 25 minutes  Greater than 50% was spent in counseling and coordination of care with the patient    Orders Placed This Encounter  Procedures   EKG 12-Lead     Signed, Esmond Plants, M.D., Ph.D. 06/23/2021  [facility] [place], [place] [phone]

## 2021-06-23 ENCOUNTER — Encounter: Payer: Self-pay | Admitting: Cardiovascular Disease

## 2021-06-23 ENCOUNTER — Ambulatory Visit (INDEPENDENT_AMBULATORY_CARE_PROVIDER_SITE_OTHER): Payer: Medicare Other | Admitting: Cardiovascular Disease

## 2021-06-23 ENCOUNTER — Other Ambulatory Visit: Payer: Self-pay

## 2021-06-23 VITALS — BP 160/70 | HR 48 | Ht 68.0 in | Wt 162.4 lb

## 2021-06-23 DIAGNOSIS — E119 Type 2 diabetes mellitus without complications: Secondary | ICD-10-CM

## 2021-06-23 DIAGNOSIS — E785 Hyperlipidemia, unspecified: Secondary | ICD-10-CM

## 2021-06-23 DIAGNOSIS — I10 Essential (primary) hypertension: Secondary | ICD-10-CM | POA: Diagnosis not present

## 2021-06-23 DIAGNOSIS — G4730 Sleep apnea, unspecified: Secondary | ICD-10-CM

## 2021-06-23 DIAGNOSIS — I25118 Atherosclerotic heart disease of native coronary artery with other forms of angina pectoris: Secondary | ICD-10-CM

## 2021-06-23 DIAGNOSIS — I739 Peripheral vascular disease, unspecified: Secondary | ICD-10-CM

## 2021-06-23 DIAGNOSIS — Z951 Presence of aortocoronary bypass graft: Secondary | ICD-10-CM | POA: Diagnosis not present

## 2021-06-23 DIAGNOSIS — Z794 Long term (current) use of insulin: Secondary | ICD-10-CM

## 2021-06-23 MED ORDER — VALSARTAN 320 MG PO TABS: 320.0000 mg | ORAL_TABLET | Freq: Every day | ORAL | 3 refills | Status: DC

## 2021-06-23 MED ORDER — HYDROCHLOROTHIAZIDE 12.5 MG PO CAPS: 12.5000 mg | ORAL_CAPSULE | Freq: Every day | ORAL | 3 refills | Status: DC

## 2021-06-23 MED ORDER — NITROGLYCERIN 0.4 MG SL SUBL: 0.4000 mg | SUBLINGUAL_TABLET | SUBLINGUAL | 3 refills | Status: AC | PRN

## 2021-06-23 MED ORDER — CLONIDINE HCL 0.2 MG PO TABS: 0.2000 mg | ORAL_TABLET | Freq: Two times a day (BID) | ORAL | 3 refills | Status: DC

## 2021-06-23 MED ORDER — ATORVASTATIN CALCIUM 40 MG PO TABS: 40.0000 mg | ORAL_TABLET | Freq: Every day | ORAL | 3 refills | Status: DC

## 2021-06-23 MED ORDER — CLOPIDOGREL BISULFATE 75 MG PO TABS: 75.0000 mg | ORAL_TABLET | Freq: Every day | ORAL | 3 refills | Status: AC

## 2021-06-23 NOTE — Patient Instructions (Addendum)
Medication Instructions:   Please STOP coreg/carvedilol Heart rate is slow  If you need a refill on your cardiac medications before your next appointment, please call your pharmacy.   Lab work: No new labs needed  Testing/Procedures: No new testing needed  Follow-Up: At [facility], you and your health needs are our priority.  As part of our continuing mission to provide you with exceptional heart care, we have created designated Provider Care Teams.  These Care Teams include your primary Cardiologist (physician) and Advanced Practice Providers (APPs -  Physician Assistants and Nurse Practitioners) who all work together to provide you with the care you need, when you need it.  You will need a follow up appointment in 12 months  Providers on your designated Care Team:   Murray Hodgkins, NP Christell Faith, PA-C Marrianne Mood, PA-C Cadence Osco, [place]  COVID-19 Vaccine Information can be found at: [email or web address].uk For questions related to vaccine distribution or appointments, please email [email or web address] or call 9496234080.

## 2021-06-25 ENCOUNTER — Ambulatory Visit: Payer: Medicare Other | Admitting: Podiatry

## 2021-06-30 ENCOUNTER — Other Ambulatory Visit: Payer: Self-pay | Admitting: *Deleted

## 2021-06-30 DIAGNOSIS — Z794 Long term (current) use of insulin: Secondary | ICD-10-CM

## 2021-06-30 DIAGNOSIS — E119 Type 2 diabetes mellitus without complications: Secondary | ICD-10-CM

## 2021-07-02 ENCOUNTER — Ambulatory Visit: Payer: Medicare Other | Admitting: Podiatry

## 2021-07-02 ENCOUNTER — Other Ambulatory Visit: Payer: Self-pay

## 2021-07-02 DIAGNOSIS — M792 Neuralgia and neuritis, unspecified: Secondary | ICD-10-CM | POA: Diagnosis not present

## 2021-07-02 DIAGNOSIS — L97521 Non-pressure chronic ulcer of other part of left foot limited to breakdown of skin: Secondary | ICD-10-CM | POA: Diagnosis not present

## 2021-07-02 DIAGNOSIS — Z794 Long term (current) use of insulin: Secondary | ICD-10-CM

## 2021-07-02 DIAGNOSIS — I739 Peripheral vascular disease, unspecified: Secondary | ICD-10-CM

## 2021-07-02 DIAGNOSIS — L603 Nail dystrophy: Secondary | ICD-10-CM

## 2021-07-02 DIAGNOSIS — E119 Type 2 diabetes mellitus without complications: Secondary | ICD-10-CM | POA: Diagnosis not present

## 2021-07-03 ENCOUNTER — Encounter: Payer: Self-pay | Admitting: Podiatry

## 2021-07-03 NOTE — Progress Notes (Addendum)
Subjective:  Patient ID: Carrie Kane, female    DOB: [birth year],  MRN: [id number]  Chief Complaint  Patient presents with   Wound Check    Left foot     80 y.o. female presents for wound care.  Patient presents with follow-up to left submetatarsal 2 ulceration.  She states that it is doing a lot better it may have closed up.  She wanted to get it evaluated.  She also has secondary complaint neuropathic cream she is taking medication however she would like to know if there is any kind of cream to help with this.  Review of Systems: Negative except as noted in the HPI. Denies N/V/F/Ch.  Past Medical History:  Diagnosis Date   CAD (coronary artery disease)    a. 04/2017 Cath: LM 53, LAD 80p, 48m LCX 95ost, 721mEF 45-50%; b. 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   Diastolic dysfunction    a. 04/2017 Echo: EF 55-60%, no rwma, Gr1 DD, mildly dil LA.   Hyperlipidemia    Hypertensive heart disease    Insulin dependent diabetes mellitus     Current Outpatient Medications:    aspirin EC 81 MG tablet, Take 1 tablet (81 mg total) by mouth daily., Disp: 90 tablet, Rfl: 3   atorvastatin (LIPITOR) 40 MG tablet, Take 1 tablet (40 mg total) by mouth daily., Disp: 90 tablet, Rfl: 3   BD INSULIN SYRINGE U/F 30G X 1/2" 0.5 ML MISC, , Disp: , Rfl:    Chlorpheniramine Maleate (ALLERGY PO), Take 1 tablet by mouth daily as needed (allergies)., Disp: , Rfl:    cholecalciferol (VITAMIN D) 1000 units tablet, Take 2,000 Units by mouth daily at 12 noon. , Disp: , Rfl:    cloNIDine (CATAPRES) 0.2 MG tablet, Take 1 tablet (0.2 mg total) by mouth 2 (two) times daily., Disp: 90 tablet, Rfl: 3   clopidogrel (PLAVIX) 75 MG tablet, Take 1 tablet (75 mg total) by mouth daily., Disp: 90 tablet, Rfl: 3   ferrous sulfate 325 (65 FE) MG EC tablet, Take 1 tablet (325 mg total) by mouth daily at 2 PM., Disp: 90 tablet, Rfl: 3   HUMALOG KWIKPEN 100 UNIT/ML KwikPen, INJECT 38 UNITS UNDER THE SKIN DAILY, Disp: 45  mL, Rfl: 2   HUMALOG MIX 75/25 KWIKPEN (75-25) 100 UNIT/ML Kwikpen, Inject 45 Units into the skin daily at 2 PM., Disp: 15 mL, Rfl: 3   hydrochlorothiazide (MICROZIDE) 12.5 MG capsule, Take 1 capsule (12.5 mg total) by mouth daily., Disp: 90 capsule, Rfl: 3   insulin glargine (LANTUS) 100 UNIT/ML injection, INJECT 45 UNITS UNDER THE SKIN AT BEDTIME, Disp: 40 mL, Rfl: 3   levothyroxine (SYNTHROID) 100 MCG tablet, Take 1 tablet (100 mcg total) by mouth daily before breakfast., Disp: 90 tablet, Rfl: 3   Multiple Vitamin (MULTIVITAMIN) capsule, Take 1 capsule by mouth daily at 12 noon. , Disp: , Rfl:    nitroGLYCERIN (NITROSTAT) 0.4 MG SL tablet, Place 1 tablet (0.4 mg total) under the tongue every 5 (five) minutes as needed for chest pain., Disp: 25 tablet, Rfl: 3   Polyethyl Glycol-Propyl Glycol (SYSTANE OP), Apply 1 drop to eye as needed (dry eyes)., Disp: , Rfl:    valsartan (DIOVAN) 320 MG tablet, Take 1 tablet (320 mg total) by mouth daily., Disp: 90 tablet, Rfl: 3  Social History   Tobacco Use  Smoking Status Never  Smokeless Tobacco Never    Allergies  Allergen Reactions   Flexeril [Cyclobenzaprine] Hypertension   Objective:  There were no vitals filed for this visit. There is no height or weight on file to calculate BMI. Constitutional Well developed. Well nourished.  Vascular Dorsalis pedis pulses faintly palpable bilaterally. Posterior tibial pulses faintly palpable bilaterally. Capillary refill normal to all digits.  No cyanosis or clubbing noted. Pedal hair growth normal.  Neurologic Normal speech. Oriented to person, place, and time. Protective sensation absent.  Sharp tingling subjective component of neuropathic pain noted  Dermatologic Skin completely epithelialized to left submetatarsal 2 ulceration.  No further ulceration noted.  No signs of infection noted.  Orthopedic: No pain to palpation either foot.   Radiographs: None Assessment:   1. PVD (peripheral  vascular disease) ([place])   2. Diabetes mellitus type 2, insulin dependent ([place])   3. Nail dystrophy      Plan:  Patient was evaluated and treated and all questions answered.  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  Given the amount of pain that she is having I believe she will benefit from peripheral neuropathy cream from [name] drug pharmacy.  Prescription for drug was sent to the pharmacy.  Ulcer left submetatarsal 2 limited to the breakdown of the skin -Clinically healed with local wound care.  At this time I discussed with her the importance of shoe gear modification.  And the importance of offloading.  She states understanding will keep an eye on it. -Patient had recent vascular intervention done with restoration of flow. Left lower Extremity:  Fairly normal left common femoral artery, profunda femoris artery, and superficial femoral and popliteal arteries.  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  There was a fairly normal tibial trifurcation with heavily diseased and multiple segments of occlusion and a small anterior tibial artery which was continuous to the foot.  The peroneal artery had a moderate to long segment occlusion from the proximal segment but did reconstitute through collaterals in the mid to distal segment.  The posterior tibial artery occluded in the proximal segment and did not reconstitute distally.  The tibial vessels were seen better with a catheter placement more distal, and advanced a Kumpe catheter into the proximal anterior tibial artery to opacify distally to see that it was continuous to the foot prior to intervention.   No follow-ups on file.

## 2021-07-03 NOTE — Addendum Note (Signed)
Addended by: Boneta Lucks on: 07/03/2021 12:38 PM   Modules accepted: Level of Service

## 2021-08-04 ENCOUNTER — Encounter: Payer: Self-pay | Admitting: Internal Medicine

## 2021-08-04 ENCOUNTER — Other Ambulatory Visit: Payer: Self-pay

## 2021-08-04 ENCOUNTER — Ambulatory Visit (INDEPENDENT_AMBULATORY_CARE_PROVIDER_SITE_OTHER): Payer: Medicare Other | Admitting: Internal Medicine

## 2021-08-04 VITALS — BP 179/85 | HR 66 | Ht 68.0 in | Wt 163.0 lb

## 2021-08-04 DIAGNOSIS — I739 Peripheral vascular disease, unspecified: Secondary | ICD-10-CM | POA: Diagnosis not present

## 2021-08-04 DIAGNOSIS — I10 Essential (primary) hypertension: Secondary | ICD-10-CM

## 2021-08-04 DIAGNOSIS — I25118 Atherosclerotic heart disease of native coronary artery with other forms of angina pectoris: Secondary | ICD-10-CM | POA: Diagnosis not present

## 2021-08-04 DIAGNOSIS — E782 Mixed hyperlipidemia: Secondary | ICD-10-CM

## 2021-08-04 DIAGNOSIS — Z794 Long term (current) use of insulin: Secondary | ICD-10-CM

## 2021-08-04 DIAGNOSIS — E119 Type 2 diabetes mellitus without complications: Secondary | ICD-10-CM | POA: Diagnosis not present

## 2021-08-04 DIAGNOSIS — E039 Hypothyroidism, unspecified: Secondary | ICD-10-CM

## 2021-08-04 NOTE — Assessment & Plan Note (Signed)

## 2021-08-04 NOTE — Assessment & Plan Note (Signed)
Hypercholesterolemia  I advised the patient to follow Mediterranean diet This diet is rich in fruits vegetables and whole grain, and This diet is also rich in fish and lean meat Patient should also eat a handful of almonds or walnuts daily Recent heart study indicated that average follow-up on this kind of diet reduces the cardiovascular mortality by 50 to 70%== 

## 2021-08-04 NOTE — Assessment & Plan Note (Signed)
Refer to Dr. Lucky Cowboy for follow-up

## 2021-08-04 NOTE — Assessment & Plan Note (Signed)
We will do the blood test to check for thyroid level

## 2021-08-04 NOTE — Assessment & Plan Note (Signed)

## 2021-08-04 NOTE — Progress Notes (Signed)
Established Patient Office Visit  Subjective:  Patient ID: Carrie Kane, female    DOB: 08-01-1941  Age: 80 y.o. MRN: [id number]  CC:  Chief Complaint  Patient presents with   Diabetes    Follow up. Patient checked BS at home. Results 160    Diabetes Hypoglycemia symptoms include dizziness. Pertinent negatives for hypoglycemia include no confusion or headaches. Associated symptoms include weakness. Pertinent negatives for diabetes include no chest pain.   Carrie Kane presents for check up, bs  is labile patient has an unsteady gait uses a stick to walk she wants to be seen by her endocrinologist and neurologist  Past Medical History:  Diagnosis Date   CAD (coronary artery disease)    a. 04/2017 Cath: LM 13, LAD 80p, 3m, LCX 95ost, 26m, EF 45-50%; b. 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   Diastolic dysfunction    a. 04/2017 Echo: EF 55-60%, no rwma, Gr1 DD, mildly dil LA.   Hyperlipidemia    Hypertensive heart disease    Insulin dependent diabetes mellitus     Past Surgical History:  Procedure Laterality Date   ABDOMINAL HYSTERECTOMY     CORONARY ARTERY BYPASS GRAFT N/A 05/09/2017   Procedure: CORONARY ARTERY BYPASS GRAFTING (CABG) x 3 using left internal mammary artery and right greater saphenous vein harvested endoscopically;  Surgeon: Ivin Poot, MD;  Location: [place];  Service: Open Heart Surgery;  Laterality: N/A;   INTRAOPERATIVE TRANSESOPHAGEAL ECHOCARDIOGRAM N/A 05/09/2017   Procedure: INTRAOPERATIVE TRANSESOPHAGEAL ECHOCARDIOGRAM;  Surgeon: Ivin Poot, MD;  Location: [place];  Service: Open Heart Surgery;  Laterality: N/A;   IR RADIOLOGIST EVAL & MGMT  12/04/2020   LEFT HEART CATH AND CORONARY ANGIOGRAPHY N/A 05/02/2017   Procedure: Left Heart Cath and Coronary Angiography;  Surgeon: Wellington Hampshire, MD;  Location: [facility];  Service: Cardiovascular;  Laterality: N/A;   LOWER EXTREMITY ANGIOGRAPHY Left 02/09/2021   Procedure: LOWER  EXTREMITY ANGIOGRAPHY;  Surgeon: Algernon Huxley, MD;  Location: [facility];  Service: Cardiovascular;  Laterality: Left;   LOWER EXTREMITY ANGIOGRAPHY Right 02/16/2021   Procedure: LOWER EXTREMITY ANGIOGRAPHY;  Surgeon: Algernon Huxley, MD;  Location: [facility];  Service: Cardiovascular;  Laterality: Right;    Family History  Problem Relation Age of Onset   Breast cancer Maternal Aunt     Social History   Socioeconomic History   Marital status: Married    Spouse name: [name]    Number of children: 1   Years of education: Not on file   Highest education level: Not on file  Occupational History   Not on file  Tobacco Use   Smoking status: Never   Smokeless tobacco: Never  Substance and Sexual Activity   Alcohol use: No   Drug use: No   Sexual activity: Not on file  Other Topics Concern   Not on file  Social History Narrative   Lives at home with spouse    Social Determinants of Health   Financial Resource Strain: Not on file  Food Insecurity: Not on file  Transportation Needs: Not on file  Physical Activity: Not on file  Stress: Not on file  Social Connections: Not on file  Intimate Partner Violence: Not on file     Current Outpatient Medications:    aspirin EC 81 MG tablet, Take 1 tablet (81 mg total) by mouth daily., Disp: 90 tablet, Rfl: 3   atorvastatin (LIPITOR) 40 MG tablet, Take 1 tablet (  40 mg total) by mouth daily., Disp: 90 tablet, Rfl: 3   BD INSULIN SYRINGE U/F 30G X 1/2" 0.5 ML MISC, , Disp: , Rfl:    Chlorpheniramine Maleate (ALLERGY PO), Take 1 tablet by mouth daily as needed (allergies)., Disp: , Rfl:    cholecalciferol (VITAMIN D) 1000 units tablet, Take 2,000 Units by mouth daily at 12 noon. , Disp: , Rfl:    cloNIDine (CATAPRES) 0.2 MG tablet, Take 1 tablet (0.2 mg total) by mouth 2 (two) times daily., Disp: 90 tablet, Rfl: 3   clopidogrel (PLAVIX) 75 MG tablet, Take 1 tablet (75 mg total) by mouth daily., Disp: 90 tablet, Rfl: 3    ferrous sulfate 325 (65 FE) MG EC tablet, Take 1 tablet (325 mg total) by mouth daily at 2 PM., Disp: 90 tablet, Rfl: 3   HUMALOG KWIKPEN 100 UNIT/ML KwikPen, INJECT 38 UNITS UNDER THE SKIN DAILY, Disp: 45 mL, Rfl: 2   HUMALOG MIX 75/25 KWIKPEN (75-25) 100 UNIT/ML Kwikpen, Inject 45 Units into the skin daily at 2 PM., Disp: 15 mL, Rfl: 3   hydrochlorothiazide (MICROZIDE) 12.5 MG capsule, Take 1 capsule (12.5 mg total) by mouth daily., Disp: 90 capsule, Rfl: 3   insulin glargine (LANTUS) 100 UNIT/ML injection, INJECT 45 UNITS UNDER THE SKIN AT BEDTIME, Disp: 40 mL, Rfl: 3   levothyroxine (SYNTHROID) 100 MCG tablet, Take 1 tablet (100 mcg total) by mouth daily before breakfast., Disp: 90 tablet, Rfl: 3   Multiple Vitamin (MULTIVITAMIN) capsule, Take 1 capsule by mouth daily at 12 noon. , Disp: , Rfl:    nitroGLYCERIN (NITROSTAT) 0.4 MG SL tablet, Place 1 tablet (0.4 mg total) under the tongue every 5 (five) minutes as needed for chest pain., Disp: 25 tablet, Rfl: 3   Polyethyl Glycol-Propyl Glycol (SYSTANE OP), Apply 1 drop to eye as needed (dry eyes)., Disp: , Rfl:    valsartan (DIOVAN) 320 MG tablet, Take 1 tablet (320 mg total) by mouth daily., Disp: 90 tablet, Rfl: 3   Allergies  Allergen Reactions   Flexeril [Cyclobenzaprine] Hypertension    ROS Review of Systems  Cardiovascular:  Negative for chest pain.  Gastrointestinal:  Negative for abdominal distention.  Genitourinary:  Negative for difficulty urinating.  Neurological:  Positive for dizziness, weakness and light-headedness. Negative for headaches.  Psychiatric/Behavioral:  Negative for agitation, behavioral problems and confusion.      Objective:    Physical Exam Neurological:     General: No focal deficit present.     Mental Status: She is oriented to person, place, and time.     Motor: Weakness present.     Coordination: Coordination abnormal.     Gait: Gait abnormal.    BP (!) 179/85   Pulse 66   Ht $R'5\' 8"'VX$  (1.727 m)    Wt 163 lb (73.9 kg)   BMI 24.78 kg/m  Wt Readings from Last 3 Encounters:  08/04/21 163 lb (73.9 kg)  06/23/21 162 lb 6 oz (73.7 kg)  03/11/21 159 lb (72.1 kg)     Health Maintenance Due  Topic Date Due   Hepatitis C Screening  Never done   TETANUS/TDAP  Never done   Zoster Vaccines- Shingrix (1 of 2) Never done   COVID-19 Vaccine (4 - Booster for Moderna series) 11/14/2020   FOOT EXAM  04/03/2021   INFLUENZA VACCINE  05/18/2021   OPHTHALMOLOGY EXAM  07/15/2021    There are no preventive care reminders to display for this patient.  Lab Results  Component  Value Date   TSH 0.372 05/03/2017   Lab Results  Component Value Date   WBC 5.5 08/11/2020   HGB 12.0 08/11/2020   HCT 37.3 08/11/2020   MCV 93 08/11/2020   PLT 227 08/11/2020   Lab Results  Component Value Date   NA 143 04/29/2021   K 4.4 04/29/2021   CO2 27 04/29/2021   GLUCOSE 97 04/29/2021   BUN 14 04/29/2021   CREATININE 0.83 04/29/2021   CALCIUM 9.8 04/29/2021   ANIONGAP 13 08/19/2020   EGFR 72 04/29/2021   Lab Results  Component Value Date   CHOL 132 08/11/2020   Lab Results  Component Value Date   HDL 68 08/11/2020   Lab Results  Component Value Date   LDLCALC 49 08/11/2020   Lab Results  Component Value Date   TRIG 79 08/11/2020   Lab Results  Component Value Date   CHOLHDL 1.9 08/11/2020   Lab Results  Component Value Date   HGBA1C 9.1 (H) 02/20/2021      Assessment & Plan:   Problem List Items Addressed This Visit       Cardiovascular and Mediastinum   Coronary artery disease of native artery of native heart with stable angina pectoris ([place])    Patient denies any chest pain or shortness of breath.      PAD (peripheral artery disease) ([place])    Refer to Dr. Lucky Cowboy for follow-up      Essential hypertension    The following hypertensive lifestyle modification were recommended and discussed:  1. Limiting alcohol intake to less than 1 oz/day of ethanol:(24 oz of beer or 8 oz  of wine or 2 oz of 100-proof whiskey). 2. Take baby ASA 81 mg daily. 3. Importance of regular aerobic exercise and losing weight. 4. Reduce dietary saturated fat and cholesterol intake for overall cardiovascular health. 5. Maintaining adequate dietary potassium, calcium, and magnesium intake. 6. Regular monitoring of the blood pressure. 7. Reduce sodium intake to less than 100 mmol/day (less than 2.3 gm of sodium or less than 6 gm of sodium choride)         Endocrine   Diabetes mellitus type 2, insulin dependent ([place])    - The patient's blood sugar is labile on med. - The patient will continue the current treatment regimen.  - I encouraged the patient to regularly check blood sugar.  - I encouraged the patient to monitor diet. I encouraged the patient to eat low-carb and low-sugar to help prevent blood sugar spikes.  - I encouraged the patient to continue following their prescribed treatment plan for diabetes - I informed the patient to get help if blood sugar drops below $RemoveBef'54mg'mymuojcEne$ /dL, or if suddenly have trouble thinking clearly or breathing.  Patient was advised to buy a book on diabetes from a local bookstore or from [place]  Patient should read 2 chapters every day to keep the motivation going, this is in addition to some of the materials we provided them from the office.  There are other resources on the Internet like YouTube and wilkipedia to get an education on the diabetes      Hypothyroidism    We will do the blood test to check for thyroid level        Other   Mixed hyperlipidemia    Hypercholesterolemia  I advised the patient to follow Mediterranean diet This diet is rich in fruits vegetables and whole grain, and This diet is also rich in fish and lean meat Patient  should also eat a handful of almonds or walnuts daily Recent heart study indicated that average follow-up on this kind of diet reduces the cardiovascular mortality by 50 to 70%==      Claudication (HCC)     Claudication is much better, patient, has a stent bilaterally      Other Visit Diagnoses     Type 2 diabetes mellitus without complication, with long-term current use of insulin ([place])    -  Primary   Relevant Orders   POCT HgB A1C       No orders of the defined types were placed in this encounter.   Follow-up: No follow-ups on file.    Cletis Athens, MD

## 2021-08-04 NOTE — Assessment & Plan Note (Signed)
Claudication is much better, patient, has a stent bilaterally

## 2021-08-04 NOTE — Assessment & Plan Note (Signed)
Patient denies any chest pain or shortness of breath 

## 2021-08-18 ENCOUNTER — Ambulatory Visit: Payer: Medicare Other | Admitting: Podiatry

## 2021-08-18 ENCOUNTER — Other Ambulatory Visit: Payer: Self-pay

## 2021-08-18 DIAGNOSIS — M79674 Pain in right toe(s): Secondary | ICD-10-CM

## 2021-08-18 DIAGNOSIS — E119 Type 2 diabetes mellitus without complications: Secondary | ICD-10-CM

## 2021-08-18 DIAGNOSIS — Z794 Long term (current) use of insulin: Secondary | ICD-10-CM | POA: Diagnosis not present

## 2021-08-18 DIAGNOSIS — M79675 Pain in left toe(s): Secondary | ICD-10-CM | POA: Diagnosis not present

## 2021-08-18 DIAGNOSIS — B351 Tinea unguium: Secondary | ICD-10-CM | POA: Diagnosis not present

## 2021-08-18 NOTE — Progress Notes (Signed)
Subjective:  Patient ID: Carrie Kane, female    DOB: 06-03-1941,  MRN: [id number]  Chief Complaint  Patient presents with   Foot Pain    Left foot wound follow up Nail trim    80 y.o. female presents for wound care.  Patient presents with thickened and negative dystrophic toenails x10.  Mild pain on palpation.  Patient would like to have them debrided out and she has not been doing her self.  She is a diabetic.  Review of Systems: Negative except as noted in the HPI. Denies N/V/F/Ch.  Past Medical History:  Diagnosis Date   CAD (coronary artery disease)    a. 04/2017 Cath: LM 22, LAD 80p, 67m, LCX 95ost, 47m, EF 45-50%; b. 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   Diastolic dysfunction    a. 04/2017 Echo: EF 55-60%, no rwma, Gr1 DD, mildly dil LA.   Hyperlipidemia    Hypertensive heart disease    Insulin dependent diabetes mellitus     Current Outpatient Medications:    aspirin EC 81 MG tablet, Take 1 tablet (81 mg total) by mouth daily., Disp: 90 tablet, Rfl: 3   atorvastatin (LIPITOR) 40 MG tablet, Take 1 tablet (40 mg total) by mouth daily., Disp: 90 tablet, Rfl: 3   BD INSULIN SYRINGE U/F 30G X 1/2" 0.5 ML MISC, , Disp: , Rfl:    Chlorpheniramine Maleate (ALLERGY PO), Take 1 tablet by mouth daily as needed (allergies)., Disp: , Rfl:    cholecalciferol (VITAMIN D) 1000 units tablet, Take 2,000 Units by mouth daily at 12 noon. , Disp: , Rfl:    cloNIDine (CATAPRES) 0.2 MG tablet, Take 1 tablet (0.2 mg total) by mouth 2 (two) times daily., Disp: 90 tablet, Rfl: 3   clopidogrel (PLAVIX) 75 MG tablet, Take 1 tablet (75 mg total) by mouth daily., Disp: 90 tablet, Rfl: 3   ferrous sulfate 325 (65 FE) MG EC tablet, Take 1 tablet (325 mg total) by mouth daily at 2 PM., Disp: 90 tablet, Rfl: 3   HUMALOG KWIKPEN 100 UNIT/ML KwikPen, INJECT 38 UNITS UNDER THE SKIN DAILY, Disp: 45 mL, Rfl: 2   HUMALOG MIX 75/25 KWIKPEN (75-25) 100 UNIT/ML Kwikpen, Inject 45 Units into the skin  daily at 2 PM., Disp: 15 mL, Rfl: 3   hydrochlorothiazide (MICROZIDE) 12.5 MG capsule, Take 1 capsule (12.5 mg total) by mouth daily., Disp: 90 capsule, Rfl: 3   insulin glargine (LANTUS) 100 UNIT/ML injection, INJECT 45 UNITS UNDER THE SKIN AT BEDTIME, Disp: 40 mL, Rfl: 3   levothyroxine (SYNTHROID) 100 MCG tablet, Take 1 tablet (100 mcg total) by mouth daily before breakfast., Disp: 90 tablet, Rfl: 3   Multiple Vitamin (MULTIVITAMIN) capsule, Take 1 capsule by mouth daily at 12 noon. , Disp: , Rfl:    nitroGLYCERIN (NITROSTAT) 0.4 MG SL tablet, Place 1 tablet (0.4 mg total) under the tongue every 5 (five) minutes as needed for chest pain., Disp: 25 tablet, Rfl: 3   Polyethyl Glycol-Propyl Glycol (SYSTANE OP), Apply 1 drop to eye as needed (dry eyes)., Disp: , Rfl:    valsartan (DIOVAN) 320 MG tablet, Take 1 tablet (320 mg total) by mouth daily., Disp: 90 tablet, Rfl: 3  Social History   Tobacco Use  Smoking Status Never  Smokeless Tobacco Never    Allergies  Allergen Reactions   Flexeril [Cyclobenzaprine] Hypertension   Objective:  There were no vitals filed for this visit. There is no height or weight on file to calculate BMI.  Constitutional Well developed. Well nourished.  Vascular Dorsalis pedis pulses faintly palpable bilaterally. Posterior tibial pulses faintly palpable bilaterally. Capillary refill normal to all digits.  No cyanosis or clubbing noted. Pedal hair growth normal.  Neurologic Normal speech. Oriented to person, place, and time. Protective sensation absent.  Sharp tingling subjective component of neuropathic pain noted  Dermatologic Skin completely epithelialized to left submetatarsal 2 ulceration.  No further ulceration noted.  No signs of infection noted.  Thickened elongated dystrophic toenails x10.  Mild pain on palpation.  Mycotic nature to it.  Orthopedic: No pain to palpation either foot.   Radiographs: None Assessment:   No diagnosis  found.    Plan:  Patient was evaluated and treated and all questions answered.  Onychomycosis with pain  -Nails palliatively debrided as below. -Educated on self-care  Procedure: Nail Debridement Rationale: pain  Type of Debridement: manual, sharp debridement. Instrumentation: Nail nipper, rotary burr. Number of Nails: 10  Procedures and Treatment: Consent by patient was obtained for treatment procedures. The patient understood the discussion of treatment and procedures well. All questions were answered thoroughly reviewed. Debridement of mycotic and hypertrophic toenails, 1 through 5 bilateral and clearing of subungual debris. No ulceration, no infection noted.  Return Visit-Office Procedure: Patient instructed to return to the office for a follow up visit 3 months for continued evaluation and treatment.  Boneta Lucks, DPM    No follow-ups on file.     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  Ulcer left submetatarsal 2 limited to the breakdown of the skin -Clinically healed.   No follow-ups on file.

## 2021-08-25 ENCOUNTER — Other Ambulatory Visit: Payer: Self-pay | Admitting: Internal Medicine

## 2021-08-27 LAB — CBC WITH DIFFERENTIAL/PLATELET
Basophils Absolute: 0 10*3/uL (ref 0.0–0.2)
Basos: 1 %
EOS (ABSOLUTE): 0.2 10*3/uL (ref 0.0–0.4)
Eos: 4 %
Hematocrit: 37.6 % (ref 34.0–46.6)
Hemoglobin: 12.1 g/dL (ref 11.1–15.9)
Immature Grans (Abs): 0 10*3/uL (ref 0.0–0.1)
Immature Granulocytes: 0 %
Lymphocytes Absolute: 2.6 10*3/uL (ref 0.7–3.1)
Lymphs: 60 %
MCH: 29.5 pg (ref 26.6–33.0)
MCHC: 32.2 g/dL (ref 31.5–35.7)
MCV: 92 fL (ref 79–97)
Monocytes Absolute: 0.3 10*3/uL (ref 0.1–0.9)
Monocytes: 6 %
Neutrophils Absolute: 1.3 10*3/uL — ABNORMAL LOW (ref 1.4–7.0)
Neutrophils: 29 %
Platelets: 212 10*3/uL (ref 150–450)
RBC: 4.1 x10E6/uL (ref 3.77–5.28)
RDW: 13.4 % (ref 11.7–15.4)
WBC: 4.4 10*3/uL (ref 3.4–10.8)

## 2021-08-27 LAB — MICROALBUMIN / CREATININE URINE RATIO
Creatinine, Urine: 40.1 mg/dL
Microalb/Creat Ratio: 25 mg/g creat (ref 0–29)
Microalbumin, Urine: 10.1 ug/mL

## 2021-08-27 LAB — COMPREHENSIVE METABOLIC PANEL
ALT: 22 IU/L (ref 0–32)
AST: 25 IU/L (ref 0–40)
Albumin/Globulin Ratio: 1.8 (ref 1.2–2.2)
Albumin: 4.3 g/dL (ref 3.7–4.7)
Alkaline Phosphatase: 82 IU/L (ref 44–121)
BUN/Creatinine Ratio: 15 (ref 12–28)
BUN: 13 mg/dL (ref 8–27)
Bilirubin Total: 0.4 mg/dL (ref 0.0–1.2)
CO2: 28 mmol/L (ref 20–29)
Calcium: 9.8 mg/dL (ref 8.7–10.3)
Chloride: 100 mmol/L (ref 96–106)
Creatinine, Ser: 0.86 mg/dL (ref 0.57–1.00)
Globulin, Total: 2.4 g/dL (ref 1.5–4.5)
Glucose: 138 mg/dL — ABNORMAL HIGH (ref 70–99)
Potassium: 4.1 mmol/L (ref 3.5–5.2)
Sodium: 140 mmol/L (ref 134–144)
Total Protein: 6.7 g/dL (ref 6.0–8.5)
eGFR: 69 mL/min/{1.73_m2} (ref >59)

## 2021-08-27 LAB — HEPATITIS C ANTIBODY: Hep C Virus Ab: <0.1 s/co ratio (ref 0.0–0.9)

## 2021-08-27 LAB — LIPID PANEL W/O CHOL/HDL RATIO
Cholesterol, Total: 150 mg/dL (ref 100–199)
HDL: 74 mg/dL (ref >39)
LDL Chol Calc (NIH): 62 mg/dL (ref 0–99)
Triglycerides: 71 mg/dL (ref 0–149)
VLDL Cholesterol Cal: 14 mg/dL (ref 5–40)

## 2021-08-27 LAB — TSH: TSH: 0.62 u[IU]/mL (ref 0.450–4.500)

## 2021-08-27 LAB — HGB A1C W/O EAG: Hgb A1c MFr Bld: 8.9 % — ABNORMAL HIGH (ref 4.8–5.6)

## 2021-09-08 LAB — HM DIABETES EYE EXAM

## 2021-09-28 ENCOUNTER — Other Ambulatory Visit: Payer: Self-pay | Admitting: Internal Medicine

## 2021-09-28 DIAGNOSIS — Z1231 Encounter for screening mammogram for malignant neoplasm of breast: Secondary | ICD-10-CM

## 2021-10-06 ENCOUNTER — Encounter: Payer: Self-pay | Admitting: Ophthalmology

## 2021-10-20 ENCOUNTER — Encounter: Payer: Self-pay | Admitting: *Deleted

## 2021-10-21 NOTE — Discharge Instructions (Signed)

## 2021-10-23 ENCOUNTER — Ambulatory Visit: Payer: Medicare Other

## 2021-10-26 ENCOUNTER — Ambulatory Visit: Payer: Medicare Other | Admitting: Anesthesiology

## 2021-10-26 ENCOUNTER — Encounter: Payer: Self-pay | Admitting: Ophthalmology

## 2021-10-26 ENCOUNTER — Ambulatory Visit
Admission: RE | Admit: 2021-10-26 | Discharge: 2021-10-26 | Disposition: A | Discharge status: Home / Self Care | Payer: Medicare Other | Attending: Ophthalmology | Admitting: Ophthalmology

## 2021-10-26 ENCOUNTER — Encounter
Admission: RE | Disposition: A | Discharge status: Home / Self Care | Payer: Self-pay | Source: Home / Self Care | Attending: Ophthalmology

## 2021-10-26 ENCOUNTER — Other Ambulatory Visit: Payer: Self-pay

## 2021-10-26 DIAGNOSIS — H2511 Age-related nuclear cataract, right eye: Secondary | ICD-10-CM | POA: Insufficient documentation

## 2021-10-26 DIAGNOSIS — E039 Hypothyroidism, unspecified: Secondary | ICD-10-CM | POA: Insufficient documentation

## 2021-10-26 DIAGNOSIS — E1136 Type 2 diabetes mellitus with diabetic cataract: Secondary | ICD-10-CM | POA: Insufficient documentation

## 2021-10-26 DIAGNOSIS — Z951 Presence of aortocoronary bypass graft: Secondary | ICD-10-CM | POA: Insufficient documentation

## 2021-10-26 DIAGNOSIS — I10 Essential (primary) hypertension: Secondary | ICD-10-CM | POA: Insufficient documentation

## 2021-10-26 HISTORY — PX: CATARACT EXTRACTION W/PHACO: SHX586

## 2021-10-26 LAB — GLUCOSE, CAPILLARY
Glucose-Capillary: 109 mg/dL — ABNORMAL HIGH (ref 70–99)
Glucose-Capillary: 137 mg/dL — ABNORMAL HIGH (ref 70–99)

## 2021-10-26 SURGERY — PHACOEMULSIFICATION, CATARACT, WITH IOL INSERTION
Anesthesia: Monitor Anesthesia Care | Site: Eye | Laterality: Right

## 2021-10-26 MED ORDER — FENTANYL CITRATE (PF) 100 MCG/2ML IJ SOLN
INTRAMUSCULAR | Status: DC | PRN
Start: 2021-10-26 — End: 2021-10-26
  Administered 2021-10-26: 50 ug via INTRAVENOUS

## 2021-10-26 MED ORDER — SIGHTPATH DOSE#1 SODIUM HYALURONATE 10 MG/ML IO SOLUTION
PREFILLED_SYRINGE | INTRAOCULAR | Status: DC | PRN
  Administered 2021-10-26: 0.85 mL via INTRAOCULAR

## 2021-10-26 MED ORDER — SIGHTPATH DOSE#1 BSS IO SOLN
INTRAOCULAR | Status: DC | PRN
  Administered 2021-10-26: 15 mL

## 2021-10-26 MED ORDER — TETRACAINE HCL 0.5 % OP SOLN
1.0000 [drp] | OPHTHALMIC | Status: DC | PRN
  Administered 2021-10-26 (×3): 1 [drp] via OPHTHALMIC

## 2021-10-26 MED ORDER — SIGHTPATH DOSE#1 BSS IO SOLN
INTRAOCULAR | Status: DC | PRN
  Administered 2021-10-26: 107 mL via OPHTHALMIC

## 2021-10-26 MED ORDER — ACETAMINOPHEN 500 MG PO TABS: 1000.0000 mg | ORAL_TABLET | Freq: Once | ORAL | Status: DC | PRN

## 2021-10-26 MED ORDER — ARMC OPHTHALMIC DILATING DROPS
1.0000 "application " | OPHTHALMIC | Status: DC | PRN
  Administered 2021-10-26 (×3): 1 via OPHTHALMIC

## 2021-10-26 MED ORDER — LACTATED RINGERS IV SOLN: INTRAVENOUS | Status: DC

## 2021-10-26 MED ORDER — LIDOCAINE HCL (PF) 2 % IJ SOLN
INTRAOCULAR | Status: DC | PRN
  Administered 2021-10-26: 1 mL via INTRAOCULAR

## 2021-10-26 MED ORDER — MIDAZOLAM HCL 2 MG/2ML IJ SOLN
INTRAMUSCULAR | Status: DC | PRN
  Administered 2021-10-26: 1 mg via INTRAVENOUS

## 2021-10-26 MED ORDER — SIGHTPATH DOSE#1 SODIUM HYALURONATE 23 MG/ML IO SOLUTION
PREFILLED_SYRINGE | INTRAOCULAR | Status: DC | PRN
  Administered 2021-10-26: 0.6 mL via INTRAOCULAR

## 2021-10-26 MED ORDER — ONDANSETRON HCL 4 MG/2ML IJ SOLN: 4.0000 mg | Freq: Once | INTRAMUSCULAR | Status: DC | PRN

## 2021-10-26 MED ORDER — ACETAMINOPHEN 160 MG/5ML PO SOLN: 975.0000 mg | Freq: Once | ORAL | Status: DC | PRN

## 2021-10-26 MED ORDER — MOXIFLOXACIN HCL 0.5 % OP SOLN
OPHTHALMIC | Status: DC | PRN
  Administered 2021-10-26: 0.2 mL via OPHTHALMIC

## 2021-10-26 SURGICAL SUPPLY — 14 items
CATARACT SUITE SIGHTPATH (MISCELLANEOUS) ×2 IMPLANT
DISSECTOR HYDRO NUCLEUS 50X22 (MISCELLANEOUS) ×2 IMPLANT
FEE CATARACT SUITE SIGHTPATH (MISCELLANEOUS) ×1 IMPLANT
GLOVE SURG GAMMEX PI TX LF 7.5 (GLOVE) ×2 IMPLANT
GLOVE SURG SYN 8.5  E (GLOVE) ×1
GLOVE SURG SYN 8.5 E (GLOVE) ×1 IMPLANT
GLOVE SURG SYN 8.5 PF PI (GLOVE) ×1 IMPLANT
LENS IOL TECNIS EYHANCE 23.0 (Intraocular Lens) ×1 IMPLANT
NDL FILTER BLUNT 18X1 1/2 (NEEDLE) ×1 IMPLANT
NEEDLE FILTER BLUNT 18X 1/2SAF (NEEDLE) ×1
NEEDLE FILTER BLUNT 18X1 1/2 (NEEDLE) ×1 IMPLANT
SYR 3ML LL SCALE MARK (SYRINGE) ×2 IMPLANT
SYR 5ML LL (SYRINGE) ×2 IMPLANT
WATER STERILE IRR 250ML POUR (IV SOLUTION) ×2 IMPLANT

## 2021-10-26 NOTE — Transfer of Care (Signed)
Immediate Anesthesia Transfer of Care Note  Patient: Carrie Kane  Procedure(s) Performed: CATARACT EXTRACTION PHACO AND INTRAOCULAR LENS PLACEMENT (IOC) RIGHT DIABETIC 16.27 01:23.8 (Right: Eye)  Patient Location: PACU  Anesthesia Type: MAC  Level of Consciousness: awake, alert  and patient cooperative  Airway and Oxygen Therapy: Patient Spontanous Breathing and Patient connected to supplemental oxygen  Post-op Assessment: Post-op Vital signs reviewed, Patient's Cardiovascular Status Stable, Respiratory Function Stable, Patent Airway and No signs of Nausea or vomiting  Post-op Vital Signs: Reviewed and stable  Complications: No notable events documented.

## 2021-10-26 NOTE — H&P (Signed)
[facility]   Primary Care Physician:  Cletis Athens, MD Ophthalmologist: Dr. Benay Pillow  Pre-Procedure History & Physical: HPI:  Carrie Kane is a 81 y.o. female here for cataract surgery.   Past Medical History:  Diagnosis Date   CAD (coronary artery disease)    a. 04/2017 Cath: LM 31, LAD 80p, 33m, LCX 95ost, 22m, EF 45-50%; b. 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   Diastolic dysfunction    a. 04/2017 Echo: EF 55-60%, no rwma, Gr1 DD, mildly dil LA.   Hyperlipidemia    Hypertensive heart disease    Insulin dependent diabetes mellitus     Past Surgical History:  Procedure Laterality Date   ABDOMINAL HYSTERECTOMY     CORONARY ARTERY BYPASS GRAFT N/A 05/09/2017   Procedure: CORONARY ARTERY BYPASS GRAFTING (CABG) x 3 using left internal mammary artery and right greater saphenous vein harvested endoscopically;  Surgeon: Ivin Poot, MD;  Location: [facility];  Service: Open Heart Surgery;  Laterality: N/A;   INTRAOPERATIVE TRANSESOPHAGEAL ECHOCARDIOGRAM N/A 05/09/2017   Procedure: INTRAOPERATIVE TRANSESOPHAGEAL ECHOCARDIOGRAM;  Surgeon: Ivin Poot, MD;  Location: [facility];  Service: Open Heart Surgery;  Laterality: N/A;   IR RADIOLOGIST EVAL & MGMT  12/04/2020   LEFT HEART CATH AND CORONARY ANGIOGRAPHY N/A 05/02/2017   Procedure: Left Heart Cath and Coronary Angiography;  Surgeon: Wellington Hampshire, MD;  Location: [facility];  Service: Cardiovascular;  Laterality: N/A;   LOWER EXTREMITY ANGIOGRAPHY Left 02/09/2021   Procedure: LOWER EXTREMITY ANGIOGRAPHY;  Surgeon: Algernon Huxley, MD;  Location: [facility];  Service: Cardiovascular;  Laterality: Left;   LOWER EXTREMITY ANGIOGRAPHY Right 02/16/2021   Procedure: LOWER EXTREMITY ANGIOGRAPHY;  Surgeon: Algernon Huxley, MD;  Location: [facility];  Service: Cardiovascular;  Laterality: Right;    Prior to Admission medications   Medication Sig Start Date End Date Taking? Authorizing Provider   aspirin EC 81 MG tablet Take 1 tablet (81 mg total) by mouth daily. 08/02/17  Yes Gollan, Kathlene November, MD  atorvastatin (LIPITOR) 40 MG tablet Take 1 tablet (40 mg total) by mouth daily. 06/23/21  Yes Minna Merritts, MD  BD INSULIN SYRINGE U/F 30G X 1/2" 0.5 ML MISC  03/29/20  Yes [provider]  Chlorpheniramine Maleate (ALLERGY PO) Take 1 tablet by mouth daily as needed (allergies).   Yes [provider]  cholecalciferol (VITAMIN D) 1000 units tablet Take 2,000 Units by mouth daily at 12 noon.    Yes [provider]  cloNIDine (CATAPRES) 0.2 MG tablet Take 1 tablet (0.2 mg total) by mouth 2 (two) times daily. 06/23/21  Yes Minna Merritts, MD  clopidogrel (PLAVIX) 75 MG tablet Take 1 tablet (75 mg total) by mouth daily. 06/23/21  Yes Minna Merritts, MD  ferrous sulfate 325 (65 FE) MG EC tablet Take 1 tablet (325 mg total) by mouth daily at 2 PM. 12/23/20  Yes Masoud, Viann Shove, MD  HUMALOG KWIKPEN 100 UNIT/ML KwikPen INJECT 38 UNITS UNDER THE SKIN DAILY 01/09/21  Yes Masoud, Viann Shove, MD  HUMALOG MIX 75/25 KWIKPEN (75-25) 100 UNIT/ML Kwikpen Inject 45 Units into the skin daily at 2 PM. 03/10/21  Yes Masoud, Viann Shove, MD  hydrochlorothiazide (MICROZIDE) 12.5 MG capsule Take 1 capsule (12.5 mg total) by mouth daily. 06/23/21 10/06/21 Yes Minna Merritts, MD  insulin glargine (LANTUS) 100 UNIT/ML injection INJECT 45 UNITS UNDER THE SKIN AT BEDTIME 02/26/21  Yes Beckie Salts, FNP  levothyroxine (SYNTHROID) 100  MCG tablet Take 1 tablet (100 mcg total) by mouth daily before breakfast. 12/23/20  Yes Masoud, Viann Shove, MD  Multiple Vitamin (MULTIVITAMIN) capsule Take 1 capsule by mouth daily at 12 noon.    Yes [provider]  nitroGLYCERIN (NITROSTAT) 0.4 MG SL tablet Place 1 tablet (0.4 mg total) under the tongue every 5 (five) minutes as needed for chest pain. 06/23/21  Yes Gollan, Kathlene November, MD  Polyethyl Glycol-Propyl Glycol (SYSTANE OP) Apply 1 drop to eye as needed (dry eyes).   Yes  [provider]  valsartan (DIOVAN) 320 MG tablet Take 1 tablet (320 mg total) by mouth daily. 06/23/21 06/18/22 Yes Minna Merritts, MD    Allergies as of 09/18/2021 - Review Complete 08/18/2021  Allergen Reaction Noted   Flexeril [cyclobenzaprine] Hypertension 09/20/2013    Family History  Problem Relation Age of Onset   Breast cancer Maternal Aunt     Social History   Socioeconomic History   Marital status: Married    Spouse name: [name]    Number of children: 1   Years of education: Not on file   Highest education level: Not on file  Occupational History   Not on file  Tobacco Use   Smoking status: Never   Smokeless tobacco: Never  Substance and Sexual Activity   Alcohol use: No   Drug use: No   Sexual activity: Not on file  Other Topics Concern   Not on file  Social History Narrative   Lives at home with spouse    Social Determinants of Health   Financial Resource Strain: Not on file  Food Insecurity: Not on file  Transportation Needs: Not on file  Physical Activity: Not on file  Stress: Not on file  Social Connections: Not on file  Intimate Partner Violence: Not on file    Review of Systems: See HPI, otherwise negative ROS  Physical Exam: BP (!) 195/70    Pulse 79    Temp 97.9 F (36.6 C) (Temporal)    Resp 18    Ht 5\' 8"  (1.727 m)    Wt 73.1 kg    SpO2 99%    BMI 24.50 kg/m  General:   Alert, cooperative in NAD Head:  Normocephalic and atraumatic. Respiratory:  Normal work of breathing. Cardiovascular:  RRR  Impression/Plan: Carrie Kane is here for cataract surgery.  Risks, benefits, limitations, and alternatives regarding cataract surgery have been reviewed with the patient.  Questions have been answered.  All parties agreeable.   Benay Pillow, MD  10/26/2021, 8:03 AM

## 2021-10-26 NOTE — Anesthesia Postprocedure Evaluation (Signed)
Anesthesia Post Note  Patient: Carrie Kane  Procedure(s) Performed: CATARACT EXTRACTION PHACO AND INTRAOCULAR LENS PLACEMENT (IOC) RIGHT DIABETIC 16.27 01:23.8 (Right: Eye)     Patient location during evaluation: PACU Anesthesia Type: MAC Level of consciousness: awake and alert Pain management: pain level controlled Vital Signs Assessment: post-procedure vital signs reviewed and stable Respiratory status: spontaneous breathing, nonlabored ventilation and respiratory function stable Cardiovascular status: stable and blood pressure returned to baseline Postop Assessment: no apparent nausea or vomiting Anesthetic complications: no   No notable events documented.  April Manson

## 2021-10-26 NOTE — Op Note (Signed)
OPERATIVE NOTE  Carrie Kane [id number] 10/26/2021   PREOPERATIVE DIAGNOSIS:  Nuclear sclerotic cataract right eye.  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     PROCEDURE:  Phacoemusification with posterior chamber intraocular lens placement of the right eye   LENS:   Implant Name Type Inv. Item Serial No. Manufacturer Lot No. LRB No. Used Action  LENS IOL TECNIS EYHANCE 23.0 - [id number] Intraocular Lens LENS IOL TECNIS EYHANCE 23.0 [id number] [employer]  Right 1 Implanted       Procedure(s) with comments: CATARACT EXTRACTION PHACO AND INTRAOCULAR LENS PLACEMENT (IOC) RIGHT DIABETIC 16.27 01:23.8 (Right) - Please leave arrival at 8:00  DIB00 +23.0   ULTRASOUND TIME: 1 minutes 23 seconds.  CDE 16.27   SURGEON:  Benay Pillow, MD, MPH  ANESTHESIOLOGIST: Anesthesiologist: April Manson, MD CRNA: Cameron Ali, CRNA   ANESTHESIA:  Topical with tetracaine drops augmented with 1% preservative-free intracameral lidocaine.  ESTIMATED BLOOD LOSS: less than 1 mL.   COMPLICATIONS:  None.   DESCRIPTION OF PROCEDURE:  The patient was identified in the holding room and transported to the operating room and placed in the supine position under the operating microscope.  The right eye was identified as the operative eye and it was prepped and draped in the usual sterile ophthalmic fashion.   A 1.0 millimeter clear-corneal paracentesis was made at the 10:30 position. 0.5 ml of preservative-free 1% lidocaine with epinephrine was injected into the anterior chamber.  The anterior chamber was filled with Healon 5 viscoelastic.  A 2.4 millimeter keratome was used to make a near-clear corneal incision at the 8:00 position.  A curvilinear capsulorrhexis was made with a cystotome and capsulorrhexis forceps.  Balanced salt solution was used to hydrodissect and hydrodelineate the nucleus.    The zonule was relatively weak and there was some antero-posterior  movement of the lens complex during the case, but no zonular dehiscence or decentration of the anterior capsulorhexis was observed.  Phacoemulsification was then used in stop and chop fashion to remove the lens nucleus and epinucleus.  The remaining cortex was then removed using the irrigation and aspiration handpiece. Healon was then placed into the capsular bag to distend it for lens placement.  A lens was then injected into the capsular bag.  The remaining viscoelastic was aspirated.   Wounds were hydrated with balanced salt solution.  The anterior chamber was inflated to a physiologic pressure with balanced salt solution.   Intracameral vigamox 0.1 mL undiluted was injected into the eye and a drop placed onto the ocular surface.  No wound leaks were noted.  The patient was taken to the recovery room in stable condition without complications of anesthesia or surgery  Benay Pillow 10/26/2021, 8:34 AM

## 2021-10-26 NOTE — Anesthesia Preprocedure Evaluation (Signed)
Anesthesia Evaluation  Patient identified by MRN, date of birth, ID band Patient awake    Reviewed: Allergy & Precautions, H&P , NPO status , Patient's Chart, lab work & pertinent test results, reviewed documented beta blocker date and time   Airway Mallampati: II  TM Distance: >3 FB Neck ROM: full    Dental no notable dental hx.    Pulmonary neg pulmonary ROS,    Pulmonary exam normal breath sounds clear to auscultation       Cardiovascular Exercise Tolerance: Good hypertension, + angina + CABG (2018)  negative cardio ROS   Rhythm:regular Rate:Normal  2021 TTE - IMPRESSIONS    1. Left ventricular ejection fraction, by estimation, is 55 to 60%. The  left ventricle has normal function. The left ventricle has no regional  wall motion abnormalities. Left ventricular diastolic parameters are  consistent with Grade II diastolic  dysfunction (pseudonormalization).  2. Right ventricular systolic function is normal. The right ventricular  size is mildly enlarged. There is normal pulmonary artery systolic  pressure. The estimated right ventricular systolic pressure is 26.9 mmHg.  3. Left atrial size was moderately dilated.  4. Right atrial size was mildly dilated.  5. The mitral valve is normal in structure. No evidence of mitral valve  regurgitation. No evidence of mitral stenosis. Moderate mitral annular  calcification.  6. Tricuspid valve regurgitation is moderate.   Neuro/Psych negative neurological ROS  negative psych ROS   GI/Hepatic negative GI ROS, Neg liver ROS,   Endo/Other  negative endocrine ROSdiabetesHypothyroidism   Renal/GU negative Renal ROS  negative genitourinary   Musculoskeletal   Abdominal   Peds  Hematology negative hematology ROS (+)   Anesthesia Other Findings   Reproductive/Obstetrics negative OB ROS                             Anesthesia  Physical Anesthesia Plan  ASA: 2  Anesthesia Plan: MAC   Post-op Pain Management:    Induction:   PONV Risk Score and Plan: 2 and TIVA, Midazolam and Treatment may vary due to age or medical condition  Airway Management Planned:   Additional Equipment:   Intra-op Plan:   Post-operative Plan:   Informed Consent: I have reviewed the patients History and Physical, chart, labs and discussed the procedure including the risks, benefits and alternatives for the proposed anesthesia with the patient or authorized representative who has indicated his/her understanding and acceptance.     Dental Advisory Given  Plan Discussed with: CRNA  Anesthesia Plan Comments:         Anesthesia Quick Evaluation

## 2021-10-27 ENCOUNTER — Encounter: Payer: Self-pay | Admitting: Ophthalmology

## 2021-10-28 ENCOUNTER — Encounter: Payer: Self-pay | Admitting: Ophthalmology

## 2021-10-30 ENCOUNTER — Ambulatory Visit (INDEPENDENT_AMBULATORY_CARE_PROVIDER_SITE_OTHER): Payer: Medicare Other | Admitting: *Deleted

## 2021-10-30 DIAGNOSIS — Z0000 Encounter for general adult medical examination without abnormal findings: Secondary | ICD-10-CM

## 2021-10-30 NOTE — Progress Notes (Signed)
Subjective:   Carrie Kane is a 81 y.o. female who presents for an Initial Medicare Annual Wellness Visit.   I discussed the limitations of evaluation and management by telemedicine and the availability of in person appointments. Patient expressed understanding and agreed to proceed.   Visit performed using audio  Patient:home Provider:home   Review of Systems    Defer to provider        Objective:    Today's Vitals   10/30/21 1401  PainSc: 0-No pain   There is no height or weight on file to calculate BMI.  Advanced Directives 10/30/2021 10/26/2021 02/09/2021 06/25/2020 04/05/2020 06/09/2017 05/02/2017  Does Patient Have a Medical Advance Directive? No No No No No No No  Does patient want to make changes to medical advance directive? No - Patient declined No - Patient declined - - - - -  Would patient like information on creating a medical advance directive? Yes (MAU/Ambulatory/Procedural Areas - Information given) Yes (MAU/Ambulatory/Procedural Areas - Information given) - Yes (ED - Information included in AVS) No - Guardian declined (No Data) No - Patient declined    Current Medications (verified) Outpatient Encounter Medications as of 10/30/2021  Medication Sig   aspirin EC 81 MG tablet Take 1 tablet (81 mg total) by mouth daily.   atorvastatin (LIPITOR) 40 MG tablet Take 1 tablet (40 mg total) by mouth daily.   BD INSULIN SYRINGE U/F 30G X 1/2" 0.5 ML MISC    Chlorpheniramine Maleate (ALLERGY PO) Take 1 tablet by mouth daily as needed (allergies).   cholecalciferol (VITAMIN D) 1000 units tablet Take 2,000 Units by mouth daily at 12 noon.    cloNIDine (CATAPRES) 0.2 MG tablet Take 1 tablet (0.2 mg total) by mouth 2 (two) times daily.   clopidogrel (PLAVIX) 75 MG tablet Take 1 tablet (75 mg total) by mouth daily.   ferrous sulfate 325 (65 FE) MG EC tablet Take 1 tablet (325 mg total) by mouth daily at 2 PM.   HUMALOG KWIKPEN 100 UNIT/ML KwikPen INJECT 38 UNITS UNDER THE SKIN  DAILY   HUMALOG MIX 75/25 KWIKPEN (75-25) 100 UNIT/ML Kwikpen Inject 45 Units into the skin daily at 2 PM.   insulin glargine (LANTUS) 100 UNIT/ML injection INJECT 45 UNITS UNDER THE SKIN AT BEDTIME   levothyroxine (SYNTHROID) 100 MCG tablet Take 1 tablet (100 mcg total) by mouth daily before breakfast.   Multiple Vitamin (MULTIVITAMIN) capsule Take 1 capsule by mouth daily at 12 noon.    nitroGLYCERIN (NITROSTAT) 0.4 MG SL tablet Place 1 tablet (0.4 mg total) under the tongue every 5 (five) minutes as needed for chest pain.   Polyethyl Glycol-Propyl Glycol (SYSTANE OP) Apply 1 drop to eye as needed (dry eyes).   valsartan (DIOVAN) 320 MG tablet Take 1 tablet (320 mg total) by mouth daily.   hydrochlorothiazide (MICROZIDE) 12.5 MG capsule Take 1 capsule (12.5 mg total) by mouth daily.   No facility-administered encounter medications on file as of 10/30/2021.    Allergies (verified) Flexeril [cyclobenzaprine]   History: Past Medical History:  Diagnosis Date   CAD (coronary artery disease)    a. 04/2017 Cath: LM 10, LAD 80p, 32m, LCX 95ost, 38m, EF 45-50%; b. 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   Diastolic dysfunction    a. 04/2017 Echo: EF 55-60%, no rwma, Gr1 DD, mildly dil LA.   Hyperlipidemia    Hypertensive heart disease    Insulin dependent diabetes mellitus    Past Surgical History:  Procedure Laterality  Date   ABDOMINAL HYSTERECTOMY     CATARACT EXTRACTION W/PHACO Right 10/26/2021   Procedure: CATARACT EXTRACTION PHACO AND INTRAOCULAR LENS PLACEMENT (IOC) RIGHT DIABETIC 16.27 01:23.8;  Surgeon: Eulogio Bear, MD;  Location: [place];  Service: Ophthalmology;  Laterality: Right;  Please leave arrival at 8:00   [place] N/A 05/09/2017   Procedure: CORONARY ARTERY BYPASS GRAFTING (CABG) x 3 using left internal mammary artery and right greater saphenous vein harvested endoscopically;  Surgeon: Ivin Poot, MD;  Location: [place];   Service: Open Heart Surgery;  Laterality: N/A;   INTRAOPERATIVE TRANSESOPHAGEAL ECHOCARDIOGRAM N/A 05/09/2017   Procedure: INTRAOPERATIVE TRANSESOPHAGEAL ECHOCARDIOGRAM;  Surgeon: Ivin Poot, MD;  Location: [place];  Service: Open Heart Surgery;  Laterality: N/A;   IR RADIOLOGIST EVAL & MGMT  12/04/2020   LEFT HEART CATH AND CORONARY ANGIOGRAPHY N/A 05/02/2017   Procedure: Left Heart Cath and Coronary Angiography;  Surgeon: Wellington Hampshire, MD;  Location: [place] [facility];  Service: Cardiovascular;  Laterality: N/A;   LOWER EXTREMITY ANGIOGRAPHY Left 02/09/2021   Procedure: LOWER EXTREMITY ANGIOGRAPHY;  Surgeon: Algernon Huxley, MD;  Location: [place] [facility];  Service: Cardiovascular;  Laterality: Left;   LOWER EXTREMITY ANGIOGRAPHY Right 02/16/2021   Procedure: LOWER EXTREMITY ANGIOGRAPHY;  Surgeon: Algernon Huxley, MD;  Location: [place] [facility];  Service: Cardiovascular;  Laterality: Right;   Family History  Problem Relation Age of Onset   Breast cancer Maternal Aunt    Social History   Socioeconomic History   Marital status: Married    Spouse name: [name]    Number of children: 1   Years of education: Not on file   Highest education level: Not on file  Occupational History   Not on file  Tobacco Use   Smoking status: Never   Smokeless tobacco: Never  Substance and Sexual Activity   Alcohol use: No   Drug use: No   Sexual activity: Not on file  Other Topics Concern   Not on file  Social History Narrative   Lives at home with spouse    Social Determinants of Health   Financial Resource Strain: Low Risk    Difficulty of Paying Living Expenses: Not hard at all  Food Insecurity: No Food Insecurity   Worried About Charity fundraiser in the Last Year: Never true   Ran Out of Food in the Last Year: Never true  Transportation Needs: No Transportation Needs   Lack of Transportation (Medical): No   Lack of Transportation (Non-Medical): No  Physical Activity:  Sufficiently Active   Days of Exercise per Week: 4 days   Minutes of Exercise per Session: 40 min  Stress: No Stress Concern Present   Feeling of Stress : Not at all  Social Connections: Socially Integrated   Frequency of Communication with Friends and Family: More than three times a week   Frequency of Social Gatherings with Friends and Family: More than three times a week   Attends Religious Services: More than 4 times per year   Active Member of [employer] or Organizations: Yes   Attends Music therapist: More than 4 times per year   Marital Status: Married    Tobacco Counseling Counseling given: Not Answered   Clinical Intake:  Pre-visit preparation completed: Yes  Pain : No/denies pain Pain Score: 0-No pain     Nutritional Risks: None Diabetes: Yes CBG done?: No Did pt. bring in CBG monitor from home?:  No  How often do you need to have someone help you when you read instructions, pamphlets, or other written materials from your doctor or pharmacy?: 1 - Never What is the last grade level you completed in school?: 12 and 2 years of college  Diabetic?yes  Interpreter Needed?: No  Information entered by :: Lacretia Nicks, [place] of Daily Living In your present state of health, do you have any difficulty performing the following activities: 10/26/2021 02/16/2021  Hearing? N N  Vision? N N  Difficulty concentrating or making decisions? N N  Walking or climbing stairs? N Y  Dressing or bathing? - N  Some recent data might be hidden    Patient Care Team: Cletis Athens, MD as PCP - General (Internal Medicine) Rockey Situ Kathlene November, MD as PCP - Cardiology (Cardiology) Minna Merritts, MD as Consulting Physician (Cardiology)  Indicate any recent Medical Services you may have received from other than [facility] providers in the past year (date may be approximate).     Assessment:   This is a routine wellness examination for [place].  Hearing/Vision  screen No results found.  Dietary issues and exercise activities discussed:     Goals Addressed   None    Depression Screen PHQ 2/9 Scores 10/30/2021 10/30/2021 06/09/2017  PHQ - 2 Score 0 0 0  PHQ- 9 Score - - 1    Fall Risk Fall Risk  10/30/2021 08/04/2021 06/09/2017  Falls in the past year? 0 0 No  Number falls in past yr: 0 0 -  Injury with Fall? 0 0 -  Risk for fall due to : No Fall Risks No Fall Risks Impaired mobility  Follow up Falls evaluation completed Falls evaluation completed -    FALL RISK PREVENTION PERTAINING TO THE HOME:  Any stairs in or around the home? Yes  If so, are there any without handrails? No  Home free of loose throw rugs in walkways, pet beds, electrical cords, etc? Yes  Adequate lighting in your home to reduce risk of falls? Yes   ASSISTIVE DEVICES UTILIZED TO PREVENT FALLS:  Life alert? No  Use of a cane, walker or w/c? No  Grab bars in the bathroom? No  Shower chair or bench in shower? No  Elevated toilet seat or a handicapped toilet? No   TIMED UP AND GO:  Was the test performed? No .  Length of time to ambulate -na    Cognitive Function: MMSE - Mini Mental State Exam 10/30/2021  Not completed: Unable to complete     6CIT Screen 10/30/2021  What Year? 0 points  What month? 0 points  What time? 0 points  Count back from 20 0 points  Months in reverse 0 points  Repeat phrase 0 points  Total Score 0    Immunizations Immunization History  Administered Date(s) Administered   Influenza, High Dose Seasonal PF 08/09/2018, 08/10/2019, 08/22/2020   Influenza-Unspecified 09/28/2021   Moderna SARS-COV2 Booster Vaccination 09/28/2021   Moderna Sars-Covid-2 Vaccination 11/23/2019, 12/21/2019, 08/22/2020   Pneumococcal Conjugate-13 09/03/2014    TDAP status: Due, Education has been provided regarding the importance of this vaccine. Advised may receive this vaccine at local pharmacy or Health Dept. Aware to provide a copy of the  vaccination record if obtained from local pharmacy or Health Dept. Verbalized acceptance and understanding.  Flu Vaccine status: Up to date  Pneumococcal vaccine status: Due, Education has been provided regarding the importance of this vaccine. Advised may receive this vaccine  at local pharmacy or Health Dept. Aware to provide a copy of the vaccination record if obtained from local pharmacy or Health Dept. Verbalized acceptance and understanding.  Covid-19 vaccine status: Completed vaccines  Qualifies for Shingles Vaccine? Yes   Zostavax completed Yes   Shingrix Completed?: No.    Education has been provided regarding the importance of this vaccine. Patient has been advised to call insurance company to determine out of pocket expense if they have not yet received this vaccine. Advised may also receive vaccine at local pharmacy or Health Dept. Verbalized acceptance and understanding.  Screening Tests Health Maintenance  Topic Date Due   TETANUS/TDAP  Never done   Zoster Vaccines- Shingrix (1 of 2) Never done   Pneumonia Vaccine 31+ Years old (2 - PPSV23 if available, else PCV20) 09/04/2015   COVID-19 Vaccine (4 - Booster for Moderna series) 11/23/2021   FOOT EXAM  02/15/2022   HEMOGLOBIN A1C  02/22/2022   OPHTHALMOLOGY EXAM  09/08/2022   INFLUENZA VACCINE  Completed   DEXA SCAN  Completed   Hepatitis C Screening  Completed   HPV VACCINES  Aged Out    Health Maintenance  Health Maintenance Due  Topic Date Due   TETANUS/TDAP  Never done   Zoster Vaccines- Shingrix (1 of 2) Never done   Pneumonia Vaccine 27+ Years old (2 - PPSV23 if available, else PCV20) 09/04/2015    Patient declines colonoscopy at this time, she will do it after cataract surgeries   Patient has mammogram scheduled for next week   Bone Density status: Completed 08/04/2015. Results reflect: Bone density results: NORMAL. Repeat every 5 years.- patient wishes to decline at this time.   Lung Cancer Screening:  (Low Dose CT Chest recommended if Age 3-80 years, 30 pack-year currently smoking OR have quit w/in 15years.) does not qualify.   Lung Cancer Screening Referral: na  Additional Screening:  Hepatitis C Screening: does not qualify; Completed 08/25/2021  Vision Screening: Recommended annual ophthalmology exams for early detection of glaucoma and other disorders of the eye. Is the patient up to date with their annual eye exam?  Yes  Who is the provider or what is the name of the office in which the patient attends annual eye exams? [facility]  If pt is not established with a provider, would they like to be referred to a provider to establish care? No .   Dental Screening: Recommended annual dental exams for proper oral hygiene  Community Resource Referral / Chronic Care Management: CRR required this visit?  No   CCM required this visit?  No      Plan:     I have personally reviewed and noted the following in the patients chart:   Medical and social history Use of alcohol, tobacco or illicit drugs  Current medications and supplements including opioid prescriptions. Patient is not currently taking opioid prescriptions. Functional ability and status Nutritional status Physical activity Advanced directives List of other physicians Hospitalizations, surgeries, and ER visits in previous 12 months Vitals Screenings to include cognitive, depression, and falls Referrals and appointments  In addition, I have reviewed and discussed with patient certain preventive protocols, quality metrics, and best practice recommendations. A written personalized care plan for preventive services as well as general preventive health recommendations were provided to patient.     Lacretia Nicks, [place]   10/30/2021   Nurse Notes:  Ms. Bohan , Thank you for taking time to come for your Medicare Wellness Visit. I appreciate your ongoing  commitment to your health goals. Please review the following  plan we discussed and let me know if I can assist you in the future.   These are the goals we discussed:  Goals   None     This is a list of the screening recommended for you and due dates:  Health Maintenance  Topic Date Due   Tetanus Vaccine  Never done   Zoster (Shingles) Vaccine (1 of 2) Never done   Pneumonia Vaccine (2 - PPSV23 if available, else PCV20) 09/04/2015   COVID-19 Vaccine (4 - Booster for Moderna series) 11/23/2021   Complete foot exam   02/15/2022   Hemoglobin A1C  02/22/2022   Eye exam for diabetics  09/08/2022   Flu Shot  Completed   DEXA scan (bone density measurement)  Completed   Hepatitis C Screening: USPSTF Recommendation to screen - Ages 7-79 yo.  Completed   HPV Vaccine  Aged Out

## 2021-11-02 ENCOUNTER — Encounter: Payer: Self-pay | Admitting: *Deleted

## 2021-11-05 ENCOUNTER — Other Ambulatory Visit: Payer: Self-pay

## 2021-11-05 ENCOUNTER — Ambulatory Visit
Admission: RE | Admit: 2021-11-05 | Discharge: 2021-11-05 | Disposition: A | Discharge status: Home / Self Care | Payer: Medicare Other | Source: Ambulatory Visit | Attending: Internal Medicine | Admitting: Internal Medicine

## 2021-11-05 DIAGNOSIS — Z1231 Encounter for screening mammogram for malignant neoplasm of breast: Secondary | ICD-10-CM | POA: Diagnosis present

## 2021-11-05 IMAGING — MG MM DIGITAL SCREENING BILAT W/ TOMO AND CAD
8 series · 8 of 24 positions shown · non-contrast
Comparison: Previous exam(s).

CLINICAL DATA: Screening.

EXAM:
DIGITAL SCREENING BILATERAL MAMMOGRAM WITH TOMOSYNTHESIS AND CAD
TECHNIQUE: Bilateral screening digital craniocaudal and mediolateral oblique
mammograms were obtained. Bilateral screening digital breast
tomosynthesis was performed. The images were evaluated with
computer-aided detection.

[L MLO synth-2D]
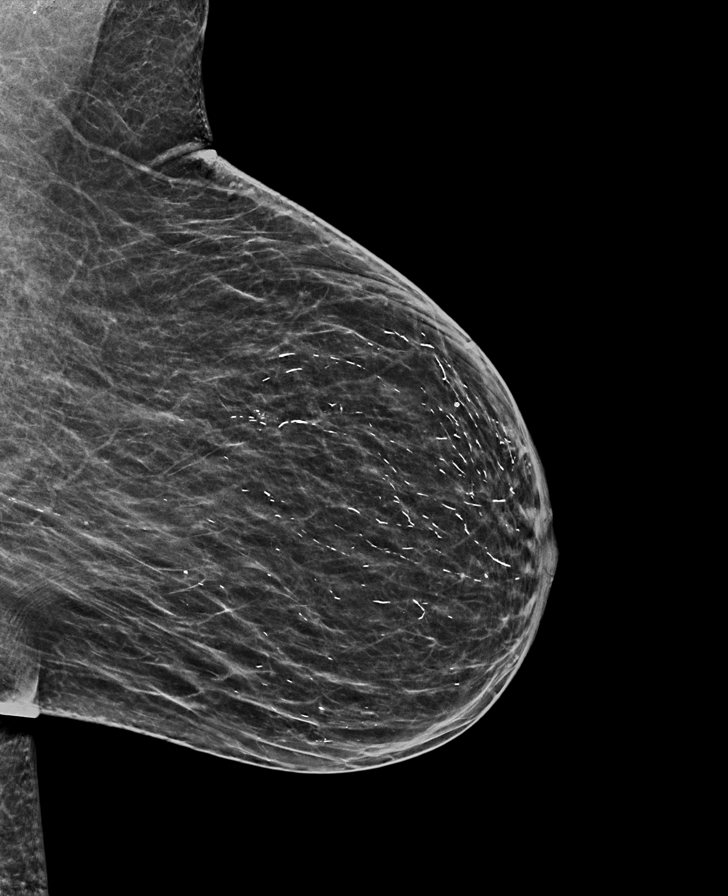

[R MLO synth-2D]
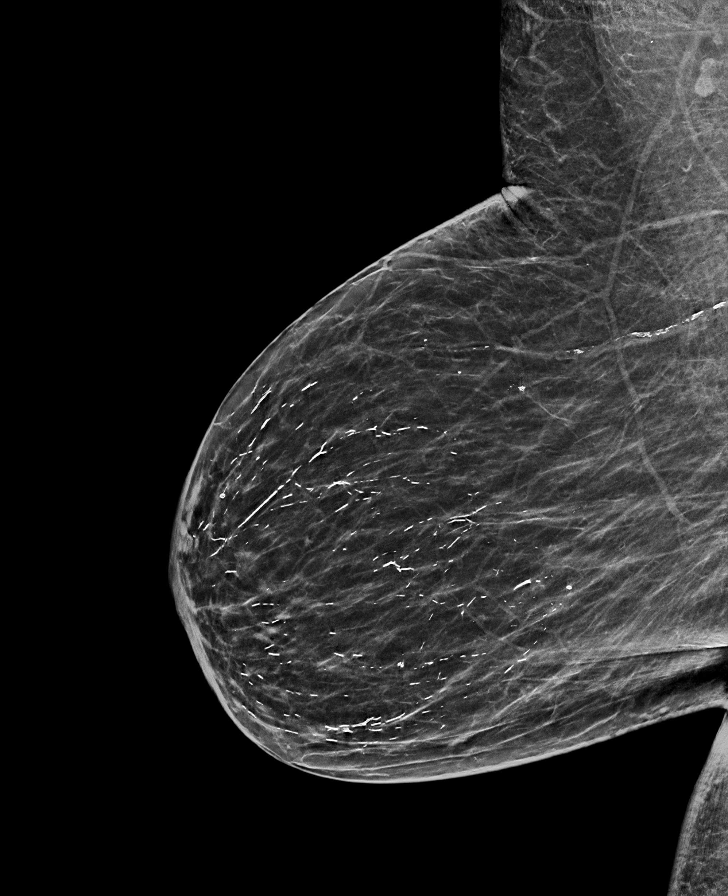

[L CC synth-2D]
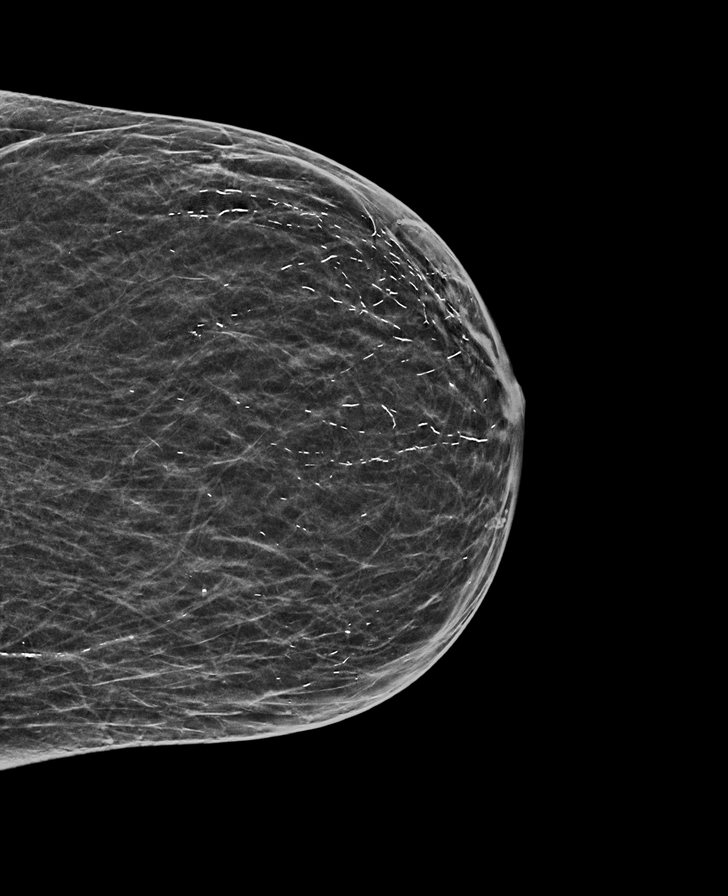

[R CC synth-2D]
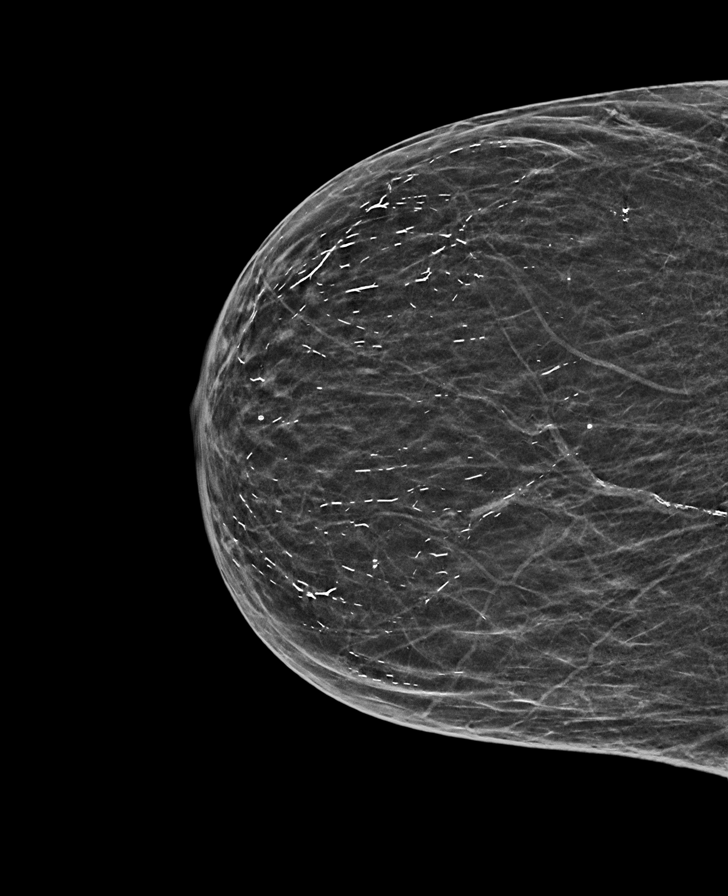

[R MLO tomo · tomo slice 29/58.0]
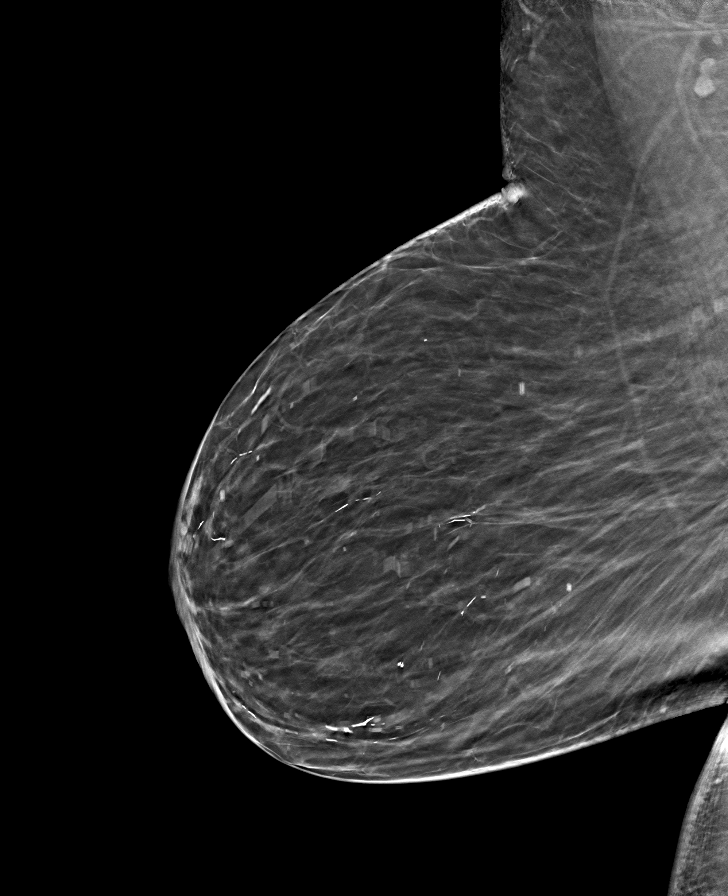

[R CC tomo · tomo slice 23/44.0]
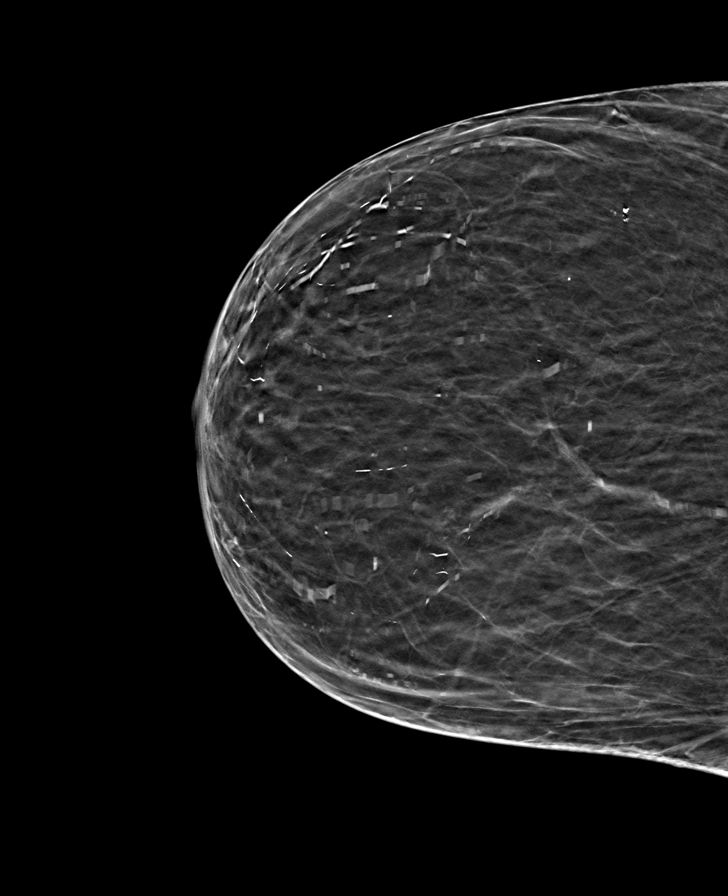

[L CC tomo · tomo slice 25/49.0]
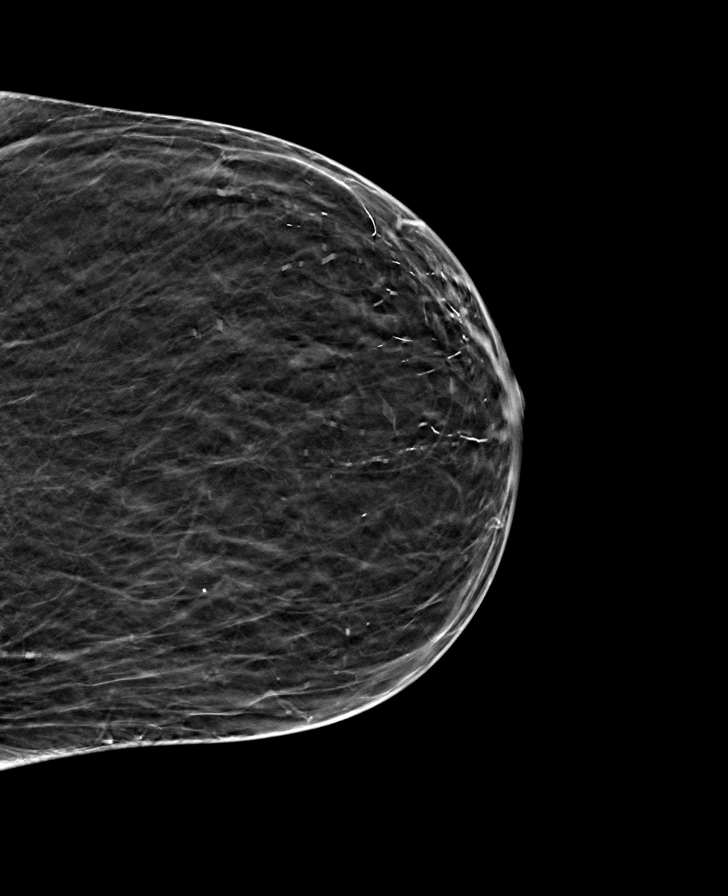

[L MLO tomo · tomo slice 29/58.0]
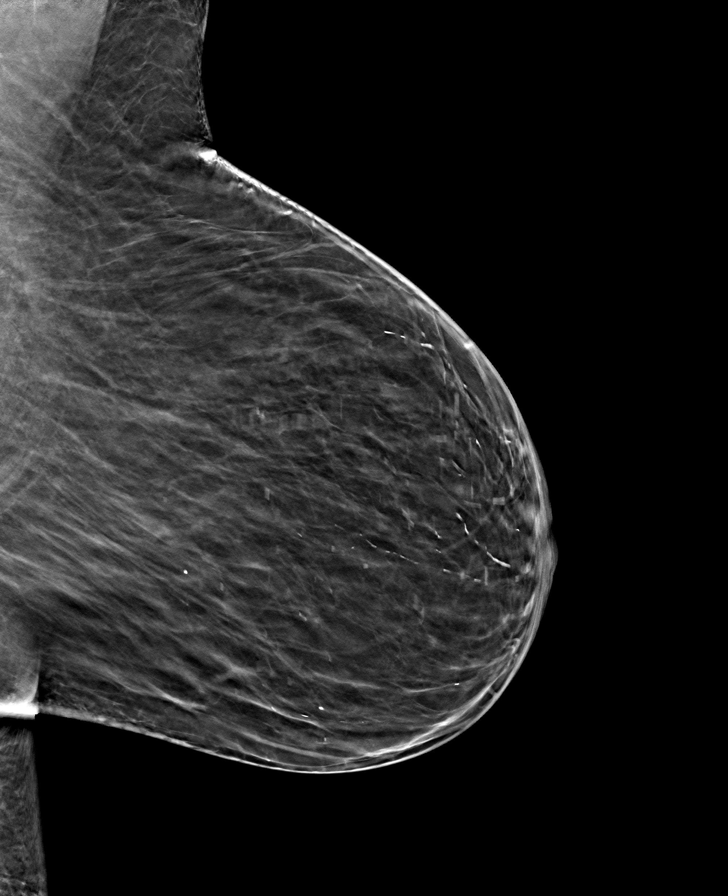

[8 of 24 positions shown; findings below may reference images not displayed]

ACR Breast Density Category b: There are scattered areas of
fibroglandular density.
FINDINGS: There are no findings suspicious for malignancy.
IMPRESSION: No mammographic evidence of malignancy. A result letter of this
screening mammogram will be mailed directly to the patient.

RECOMMENDATION:
Screening mammogram in one year. (Code:[BY])

BI-RADS CATEGORY  1: Negative.

## 2021-11-05 NOTE — Progress Notes (Signed)
I have reviewed this visit and agree with the documentation.   

## 2021-11-05 NOTE — Discharge Instructions (Signed)

## 2021-11-09 ENCOUNTER — Ambulatory Visit
Admission: RE | Admit: 2021-11-09 | Discharge: 2021-11-09 | Disposition: A | Discharge status: Home / Self Care | Payer: Medicare Other | Attending: Ophthalmology | Admitting: Ophthalmology

## 2021-11-09 ENCOUNTER — Other Ambulatory Visit: Payer: Self-pay

## 2021-11-09 ENCOUNTER — Ambulatory Visit: Payer: Medicare Other | Admitting: Anesthesiology

## 2021-11-09 ENCOUNTER — Encounter: Payer: Self-pay | Admitting: Ophthalmology

## 2021-11-09 ENCOUNTER — Encounter
Admission: RE | Disposition: A | Discharge status: Home / Self Care | Payer: Self-pay | Source: Home / Self Care | Attending: Ophthalmology

## 2021-11-09 DIAGNOSIS — Z951 Presence of aortocoronary bypass graft: Secondary | ICD-10-CM | POA: Insufficient documentation

## 2021-11-09 DIAGNOSIS — E039 Hypothyroidism, unspecified: Secondary | ICD-10-CM | POA: Diagnosis not present

## 2021-11-09 DIAGNOSIS — I2510 Atherosclerotic heart disease of native coronary artery without angina pectoris: Secondary | ICD-10-CM | POA: Insufficient documentation

## 2021-11-09 DIAGNOSIS — H2512 Age-related nuclear cataract, left eye: Secondary | ICD-10-CM | POA: Diagnosis present

## 2021-11-09 DIAGNOSIS — I10 Essential (primary) hypertension: Secondary | ICD-10-CM | POA: Insufficient documentation

## 2021-11-09 DIAGNOSIS — E1136 Type 2 diabetes mellitus with diabetic cataract: Secondary | ICD-10-CM | POA: Diagnosis not present

## 2021-11-09 DIAGNOSIS — Z794 Long term (current) use of insulin: Secondary | ICD-10-CM | POA: Diagnosis not present

## 2021-11-09 HISTORY — PX: CATARACT EXTRACTION W/PHACO: SHX586

## 2021-11-09 LAB — GLUCOSE, CAPILLARY
Glucose-Capillary: 155 mg/dL — ABNORMAL HIGH (ref 70–99)
Glucose-Capillary: 190 mg/dL — ABNORMAL HIGH (ref 70–99)

## 2021-11-09 SURGERY — PHACOEMULSIFICATION, CATARACT, WITH IOL INSERTION
Anesthesia: Monitor Anesthesia Care | Site: Eye | Laterality: Left

## 2021-11-09 MED ORDER — LACTATED RINGERS IV SOLN: INTRAVENOUS | Status: DC

## 2021-11-09 MED ORDER — FENTANYL CITRATE (PF) 100 MCG/2ML IJ SOLN
INTRAMUSCULAR | Status: DC | PRN
Start: 2021-11-09 — End: 2021-11-09
  Administered 2021-11-09: 50 ug via INTRAVENOUS

## 2021-11-09 MED ORDER — SIGHTPATH DOSE#1 BSS IO SOLN
INTRAOCULAR | Status: DC | PRN
  Administered 2021-11-09: 15 mL

## 2021-11-09 MED ORDER — LIDOCAINE HCL (PF) 2 % IJ SOLN
INTRAOCULAR | Status: DC | PRN
  Administered 2021-11-09: 1 mL via INTRAOCULAR

## 2021-11-09 MED ORDER — ACETAMINOPHEN 160 MG/5ML PO SOLN: 325.0000 mg | ORAL | Status: DC | PRN

## 2021-11-09 MED ORDER — ACETAMINOPHEN 325 MG PO TABS: 325.0000 mg | ORAL_TABLET | ORAL | Status: DC | PRN

## 2021-11-09 MED ORDER — SIGHTPATH DOSE#1 BSS IO SOLN
INTRAOCULAR | Status: DC | PRN
  Administered 2021-11-09: 91 mL via OPHTHALMIC

## 2021-11-09 MED ORDER — MIDAZOLAM HCL 2 MG/2ML IJ SOLN
INTRAMUSCULAR | Status: DC | PRN
Start: 2021-11-09 — End: 2021-11-09
  Administered 2021-11-09: 1 mg via INTRAVENOUS

## 2021-11-09 MED ORDER — MOXIFLOXACIN HCL 0.5 % OP SOLN
OPHTHALMIC | Status: DC | PRN
  Administered 2021-11-09: 0.2 mL via OPHTHALMIC

## 2021-11-09 MED ORDER — ARMC OPHTHALMIC DILATING DROPS
1.0000 "application " | OPHTHALMIC | Status: DC | PRN
  Administered 2021-11-09 (×3): 1 via OPHTHALMIC

## 2021-11-09 MED ORDER — SIGHTPATH DOSE#1 SODIUM HYALURONATE 23 MG/ML IO SOLUTION
PREFILLED_SYRINGE | INTRAOCULAR | Status: DC | PRN
  Administered 2021-11-09: 0.6 mL via INTRAOCULAR

## 2021-11-09 MED ORDER — SIGHTPATH DOSE#1 SODIUM HYALURONATE 10 MG/ML IO SOLUTION
PREFILLED_SYRINGE | INTRAOCULAR | Status: DC | PRN
  Administered 2021-11-09: 0.85 mL via INTRAOCULAR

## 2021-11-09 MED ORDER — TETRACAINE HCL 0.5 % OP SOLN
1.0000 [drp] | OPHTHALMIC | Status: DC | PRN
  Administered 2021-11-09 (×3): 1 [drp] via OPHTHALMIC

## 2021-11-09 SURGICAL SUPPLY — 16 items
CANNULA ANT/CHMB 27G (MISCELLANEOUS) IMPLANT
CANNULA ANT/CHMB 27GA (MISCELLANEOUS) ×3 IMPLANT
CATARACT SUITE SIGHTPATH (MISCELLANEOUS) ×3 IMPLANT
DISSECTOR HYDRO NUCLEUS 50X22 (MISCELLANEOUS) ×3 IMPLANT
FEE CATARACT SUITE SIGHTPATH (MISCELLANEOUS) ×1 IMPLANT
GLOVE SURG GAMMEX PI TX LF 7.5 (GLOVE) ×3 IMPLANT
GLOVE SURG SYN 8.5  E (GLOVE) ×2
GLOVE SURG SYN 8.5 E (GLOVE) ×1 IMPLANT
GLOVE SURG SYN 8.5 PF PI (GLOVE) ×1 IMPLANT
LENS IOL TECNIS EYHANCE 23.0 (Intraocular Lens) ×2 IMPLANT
NDL FILTER BLUNT 18X1 1/2 (NEEDLE) ×1 IMPLANT
NEEDLE FILTER BLUNT 18X 1/2SAF (NEEDLE) ×2
NEEDLE FILTER BLUNT 18X1 1/2 (NEEDLE) ×1 IMPLANT
SYR 3ML LL SCALE MARK (SYRINGE) ×3 IMPLANT
SYR 5ML LL (SYRINGE) ×3 IMPLANT
WATER STERILE IRR 250ML POUR (IV SOLUTION) ×3 IMPLANT

## 2021-11-09 NOTE — H&P (Signed)
[place]   Primary Care Physician:  Cletis Athens, MD Ophthalmologist: Dr. Benay Pillow  Pre-Procedure History & Physical: HPI:  Carrie Kane is a 81 y.o. female here for cataract surgery.   Past Medical History:  Diagnosis Date   CAD (coronary artery disease)    a. 04/2017 Cath: LM 76, LAD 80p, 27m, LCX 95ost, 38m, EF 45-50%; b. 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   Diastolic dysfunction    a. 04/2017 Echo: EF 55-60%, no rwma, Gr1 DD, mildly dil LA.   Hyperlipidemia    Hypertensive heart disease    Insulin dependent diabetes mellitus     Past Surgical History:  Procedure Laterality Date   ABDOMINAL HYSTERECTOMY     CATARACT EXTRACTION W/PHACO Right 10/26/2021   Procedure: CATARACT EXTRACTION PHACO AND INTRAOCULAR LENS PLACEMENT (IOC) RIGHT DIABETIC 16.27 01:23.8;  Surgeon: Eulogio Bear, MD;  Location: [place];  Service: Ophthalmology;  Laterality: Right;  Please leave arrival at 8:00   [place] N/A 05/09/2017   Procedure: CORONARY ARTERY BYPASS GRAFTING (CABG) x 3 using left internal mammary artery and right greater saphenous vein harvested endoscopically;  Surgeon: Ivin Poot, MD;  Location: [place];  Service: Open Heart Surgery;  Laterality: N/A;   INTRAOPERATIVE TRANSESOPHAGEAL ECHOCARDIOGRAM N/A 05/09/2017   Procedure: INTRAOPERATIVE TRANSESOPHAGEAL ECHOCARDIOGRAM;  Surgeon: Ivin Poot, MD;  Location: [place];  Service: Open Heart Surgery;  Laterality: N/A;   IR RADIOLOGIST EVAL & MGMT  12/04/2020   LEFT HEART CATH AND CORONARY ANGIOGRAPHY N/A 05/02/2017   Procedure: Left Heart Cath and Coronary Angiography;  Surgeon: Wellington Hampshire, MD;  Location: [place] [facility];  Service: Cardiovascular;  Laterality: N/A;   LOWER EXTREMITY ANGIOGRAPHY Left 02/09/2021   Procedure: LOWER EXTREMITY ANGIOGRAPHY;  Surgeon: Algernon Huxley, MD;  Location: [place];  Service: Cardiovascular;  Laterality: Left;   LOWER  EXTREMITY ANGIOGRAPHY Right 02/16/2021   Procedure: LOWER EXTREMITY ANGIOGRAPHY;  Surgeon: Algernon Huxley, MD;  Location: [place] [facility];  Service: Cardiovascular;  Laterality: Right;    Prior to Admission medications   Medication Sig Start Date End Date Taking? Authorizing Provider  aspirin EC 81 MG tablet Take 1 tablet (81 mg total) by mouth daily. 08/02/17  Yes Gollan, Kathlene November, MD  atorvastatin (LIPITOR) 40 MG tablet Take 1 tablet (40 mg total) by mouth daily. 06/23/21  Yes Gollan, Kathlene November, MD  Chlorpheniramine Maleate (ALLERGY PO) Take 1 tablet by mouth daily as needed (allergies).   Yes [provider]  cholecalciferol (VITAMIN D) 1000 units tablet Take 2,000 Units by mouth daily at 12 noon.    Yes [provider]  cloNIDine (CATAPRES) 0.2 MG tablet Take 1 tablet (0.2 mg total) by mouth 2 (two) times daily. 06/23/21  Yes Minna Merritts, MD  clopidogrel (PLAVIX) 75 MG tablet Take 1 tablet (75 mg total) by mouth daily. 06/23/21  Yes Minna Merritts, MD  ferrous sulfate 325 (65 FE) MG EC tablet Take 1 tablet (325 mg total) by mouth daily at 2 PM. 12/23/20  Yes Masoud, Viann Shove, MD  insulin glargine (LANTUS) 100 UNIT/ML injection INJECT 45 UNITS UNDER THE SKIN AT BEDTIME 02/26/21  Yes Beckie Salts, FNP  levothyroxine (SYNTHROID) 100 MCG tablet Take 1 tablet (100 mcg total) by mouth daily before breakfast. 12/23/20  Yes Masoud, Viann Shove, MD  Multiple Vitamin (MULTIVITAMIN) capsule Take 1 capsule by mouth daily at 12 noon.    Yes [provider]  valsartan (DIOVAN) 320 MG tablet Take 1 tablet (320 mg total) by mouth daily. 06/23/21 06/18/22 Yes Gollan, Kathlene November, MD  BD INSULIN SYRINGE U/F 30G X 1/2" 0.5 ML MISC  03/29/20   [provider]  HUMALOG KWIKPEN 100 UNIT/ML KwikPen INJECT 38 UNITS UNDER THE SKIN DAILY 01/09/21   Cletis Athens, MD  HUMALOG MIX 75/25 KWIKPEN (75-25) 100 UNIT/ML Kwikpen Inject 45 Units into the skin daily at 2 PM. 03/10/21   Cletis Athens, MD   hydrochlorothiazide (MICROZIDE) 12.5 MG capsule Take 1 capsule (12.5 mg total) by mouth daily. 06/23/21 10/06/21  Minna Merritts, MD  nitroGLYCERIN (NITROSTAT) 0.4 MG SL tablet Place 1 tablet (0.4 mg total) under the tongue every 5 (five) minutes as needed for chest pain. 06/23/21   Minna Merritts, MD  Polyethyl Glycol-Propyl Glycol (SYSTANE OP) Apply 1 drop to eye as needed (dry eyes).    [provider]    Allergies as of 09/18/2021 - Review Complete 08/18/2021  Allergen Reaction Noted   Flexeril [cyclobenzaprine] Hypertension 09/20/2013    Family History  Problem Relation Age of Onset   Breast cancer Maternal Aunt     Social History   Socioeconomic History   Marital status: Married    Spouse name: [name]    Number of children: 1   Years of education: Not on file   Highest education level: Not on file  Occupational History   Not on file  Tobacco Use   Smoking status: Never   Smokeless tobacco: Never  Substance and Sexual Activity   Alcohol use: No   Drug use: No   Sexual activity: Not on file  Other Topics Concern   Not on file  Social History Narrative   Lives at home with spouse    Social Determinants of Health   Financial Resource Strain: Low Risk    Difficulty of Paying Living Expenses: Not hard at all  Food Insecurity: No Food Insecurity   Worried About Charity fundraiser in the Last Year: Never true   [place] in the Last Year: Never true  Transportation Needs: No Transportation Needs   Lack of Transportation (Medical): No   Lack of Transportation (Non-Medical): No  Physical Activity: Sufficiently Active   Days of Exercise per Week: 4 days   Minutes of Exercise per Session: 40 min  Stress: No Stress Concern Present   Feeling of Stress : Not at all  Social Connections: Socially Integrated   Frequency of Communication with Friends and Family: More than three times a week   Frequency of Social Gatherings with Friends and Family: More  than three times a week   Attends Religious Services: More than 4 times per year   Active Member of [employer] or Organizations: Yes   Attends Music therapist: More than 4 times per year   Marital Status: Married  Human resources officer Violence: Not At Risk   Fear of Current or Ex-Partner: No   Emotionally Abused: No   Physically Abused: No   Sexually Abused: No    Review of Systems: See HPI, otherwise negative ROS  Physical Exam: BP (!) 170/75    Pulse 80    Temp 98.1 F (36.7 C) (Temporal)    Resp 18    Ht 5\' 8"  (1.727 m)    Wt 73.5 kg    SpO2 100%    BMI 24.63 kg/m  General:   Alert, cooperative in NAD Head:  Normocephalic and  atraumatic. Respiratory:  Normal work of breathing. Cardiovascular:  RRR  Impression/Plan: Persais Ethridge is here for cataract surgery.  Risks, benefits, limitations, and alternatives regarding cataract surgery have been reviewed with the patient.  Questions have been answered.  All parties agreeable.   Benay Pillow, MD  11/09/2021, 8:23 AM

## 2021-11-09 NOTE — Anesthesia Preprocedure Evaluation (Signed)
Anesthesia Evaluation  Patient identified by MRN, date of birth, ID band Patient awake    Reviewed: Allergy & Precautions, H&P , NPO status , Patient's Chart, lab work & pertinent test results, reviewed documented beta blocker date and time   Airway Mallampati: II  TM Distance: >3 FB Neck ROM: full    Dental no notable dental hx.    Pulmonary neg pulmonary ROS,    Pulmonary exam normal breath sounds clear to auscultation       Cardiovascular Exercise Tolerance: Good hypertension, + CAD and + CABG  Normal cardiovascular exam Rhythm:regular Rate:Normal     Neuro/Psych negative neurological ROS  negative psych ROS   GI/Hepatic negative GI ROS, Neg liver ROS,   Endo/Other  diabetes, Type 2, Insulin DependentHypothyroidism   Renal/GU negative Renal ROS  negative genitourinary   Musculoskeletal   Abdominal   Peds  Hematology negative hematology ROS (+)   Anesthesia Other Findings   Reproductive/Obstetrics negative OB ROS                             Anesthesia Physical Anesthesia Plan  ASA: 2  Anesthesia Plan: MAC   Post-op Pain Management:    Induction:   PONV Risk Score and Plan:   Airway Management Planned:   Additional Equipment:   Intra-op Plan:   Post-operative Plan:   Informed Consent: I have reviewed the patients History and Physical, chart, labs and discussed the procedure including the risks, benefits and alternatives for the proposed anesthesia with the patient or authorized representative who has indicated his/her understanding and acceptance.     Dental Advisory Given  Plan Discussed with: CRNA and Anesthesiologist  Anesthesia Plan Comments:         Anesthesia Quick Evaluation

## 2021-11-09 NOTE — Op Note (Signed)
OPERATIVE NOTE  Carrie Kane [id number] 11/09/2021   PREOPERATIVE DIAGNOSIS:  Nuclear sclerotic cataract left eye.  H25.12   POSTOPERATIVE DIAGNOSIS:    Nuclear sclerotic cataract left eye.     PROCEDURE:  Phacoemusification with posterior chamber intraocular lens placement of the left eye   LENS:   Implant Name Type Inv. Item Serial No. Manufacturer Lot No. LRB No. Used Action  LENS IOL TECNIS EYHANCE 23.0 - [id number] Intraocular Lens LENS IOL TECNIS EYHANCE 23.0 [id number] [employer]  Left 1 Implanted      Procedure(s) with comments: CATARACT EXTRACTION PHACO AND INTRAOCULAR LENS PLACEMENT (IOC) LEFT DIABETIC (Left) - 13.39 01:13.0  DIB00 +23.0   ULTRASOUND TIME: 1 minutes 13 seconds.  CDE 13.39   SURGEON:  Benay Pillow, MD, MPH   ANESTHESIA:  Topical with tetracaine drops augmented with 1% preservative-free intracameral lidocaine.  ESTIMATED BLOOD LOSS: <1 mL   COMPLICATIONS:  None.   DESCRIPTION OF PROCEDURE:  The patient was identified in the holding room and transported to the operating room and placed in the supine position under the operating microscope.  The left eye was identified as the operative eye and it was prepped and draped in the usual sterile ophthalmic fashion.   A 1.0 millimeter clear-corneal paracentesis was made at the 5:00 position. 0.5 ml of preservative-free 1% lidocaine with epinephrine was injected into the anterior chamber.  The anterior chamber was filled with Healon 5 viscoelastic.  A 2.4 millimeter keratome was used to make a near-clear corneal incision at the 2:00 position.  A curvilinear capsulorrhexis was made with a cystotome and capsulorrhexis forceps.  Balanced salt solution was used to hydrodissect and hydrodelineate the nucleus.   Phacoemulsification was then used in stop and chop fashion to remove the lens nucleus and epinucleus.  The remaining cortex was then removed using the irrigation and aspiration handpiece. Healon was then placed  into the capsular bag to distend it for lens placement.  A lens was then injected into the capsular bag.  The remaining viscoelastic was aspirated.   Wounds were hydrated with balanced salt solution.  The anterior chamber was inflated to a physiologic pressure with balanced salt solution.  Intracameral vigamox 0.1 mL undiltued was injected into the eye and a drop placed onto the ocular surface.  No wound leaks were noted.  The patient was taken to the recovery room in stable condition without complications of anesthesia or surgery  Benay Pillow 11/09/2021, 8:50 AM

## 2021-11-09 NOTE — Anesthesia Postprocedure Evaluation (Signed)
Anesthesia Post Note  Patient: Carrie Kane  Procedure(s) Performed: CATARACT EXTRACTION PHACO AND INTRAOCULAR LENS PLACEMENT (IOC) LEFT DIABETIC (Left: Eye)     Patient location during evaluation: PACU Anesthesia Type: MAC Level of consciousness: awake and alert Pain management: pain level controlled Vital Signs Assessment: post-procedure vital signs reviewed and stable Respiratory status: spontaneous breathing, nonlabored ventilation, respiratory function stable and patient connected to nasal cannula oxygen Cardiovascular status: stable and blood pressure returned to baseline Postop Assessment: no apparent nausea or vomiting Anesthetic complications: no   No notable events documented.  Trecia Rogers

## 2021-11-09 NOTE — Transfer of Care (Signed)
Immediate Anesthesia Transfer of Care Note  Patient: Carrie Kane  Procedure(s) Performed: CATARACT EXTRACTION PHACO AND INTRAOCULAR LENS PLACEMENT (IOC) LEFT DIABETIC (Left: Eye)  Patient Location: PACU  Anesthesia Type: MAC  Level of Consciousness: awake, alert  and patient cooperative  Airway and Oxygen Therapy: Patient Spontanous Breathing and Patient connected to supplemental oxygen  Post-op Assessment: Post-op Vital signs reviewed, Patient's Cardiovascular Status Stable, Respiratory Function Stable, Patent Airway and No signs of Nausea or vomiting  Post-op Vital Signs: Reviewed and stable  Complications: No notable events documented.

## 2021-11-10 ENCOUNTER — Encounter: Payer: Self-pay | Admitting: Ophthalmology

## 2021-11-19 ENCOUNTER — Ambulatory Visit: Payer: Medicare Other | Admitting: Podiatry

## 2021-11-19 ENCOUNTER — Other Ambulatory Visit: Payer: Self-pay

## 2021-11-19 ENCOUNTER — Encounter: Payer: Self-pay | Admitting: Podiatry

## 2021-11-19 DIAGNOSIS — L97522 Non-pressure chronic ulcer of other part of left foot with fat layer exposed: Secondary | ICD-10-CM

## 2021-11-19 DIAGNOSIS — Z794 Long term (current) use of insulin: Secondary | ICD-10-CM

## 2021-11-19 DIAGNOSIS — E119 Type 2 diabetes mellitus without complications: Secondary | ICD-10-CM | POA: Diagnosis not present

## 2021-11-19 NOTE — Progress Notes (Signed)
Subjective:  Patient ID: Carrie Kane, female    DOB: 1941/09/30,  MRN: [id number]  Chief Complaint  Patient presents with   Nail Problem    Nail trim     81 y.o. female presents for wound care.  Patient presents with follow-up to left submetatarsal 2 ulceration recurrence.  Patient states that she started noticing some pain.  She is a diabetic with now A1c of 8.9 she has had a vascular history as well.  This is likely due to constant pressure to the submetatarsal 2 due to underlying plantarflexed bone   Review of Systems: Negative except as noted in the HPI. Denies N/V/F/Ch.  Past Medical History:  Diagnosis Date   CAD (coronary artery disease)    a. 04/2017 Cath: LM 65, LAD 80p, 89m, LCX 95ost, 46m, EF 45-50%; b. 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   Diastolic dysfunction    a. 04/2017 Echo: EF 55-60%, no rwma, Gr1 DD, mildly dil LA.   Hyperlipidemia    Hypertensive heart disease    Insulin dependent diabetes mellitus     Current Outpatient Medications:    aspirin EC 81 MG tablet, Take 1 tablet (81 mg total) by mouth daily., Disp: 90 tablet, Rfl: 3   atorvastatin (LIPITOR) 40 MG tablet, Take 1 tablet (40 mg total) by mouth daily., Disp: 90 tablet, Rfl: 3   BD INSULIN SYRINGE U/F 30G X 1/2" 0.5 ML MISC, , Disp: , Rfl:    Chlorpheniramine Maleate (ALLERGY PO), Take 1 tablet by mouth daily as needed (allergies)., Disp: , Rfl:    cholecalciferol (VITAMIN D) 1000 units tablet, Take 2,000 Units by mouth daily at 12 noon. , Disp: , Rfl:    cloNIDine (CATAPRES) 0.2 MG tablet, Take 1 tablet (0.2 mg total) by mouth 2 (two) times daily., Disp: 90 tablet, Rfl: 3   clopidogrel (PLAVIX) 75 MG tablet, Take 1 tablet (75 mg total) by mouth daily., Disp: 90 tablet, Rfl: 3   ferrous sulfate 325 (65 FE) MG EC tablet, Take 1 tablet (325 mg total) by mouth daily at 2 PM., Disp: 90 tablet, Rfl: 3   HUMALOG KWIKPEN 100 UNIT/ML KwikPen, INJECT 38 UNITS UNDER THE SKIN DAILY, Disp: 45 mL, Rfl:  2   HUMALOG MIX 75/25 KWIKPEN (75-25) 100 UNIT/ML Kwikpen, Inject 45 Units into the skin daily at 2 PM., Disp: 15 mL, Rfl: 3   hydrochlorothiazide (MICROZIDE) 12.5 MG capsule, Take 1 capsule (12.5 mg total) by mouth daily., Disp: 90 capsule, Rfl: 3   insulin glargine (LANTUS) 100 UNIT/ML injection, INJECT 45 UNITS UNDER THE SKIN AT BEDTIME, Disp: 40 mL, Rfl: 3   levothyroxine (SYNTHROID) 100 MCG tablet, Take 1 tablet (100 mcg total) by mouth daily before breakfast., Disp: 90 tablet, Rfl: 3   Multiple Vitamin (MULTIVITAMIN) capsule, Take 1 capsule by mouth daily at 12 noon. , Disp: , Rfl:    nitroGLYCERIN (NITROSTAT) 0.4 MG SL tablet, Place 1 tablet (0.4 mg total) under the tongue every 5 (five) minutes as needed for chest pain., Disp: 25 tablet, Rfl: 3   Polyethyl Glycol-Propyl Glycol (SYSTANE OP), Apply 1 drop to eye as needed (dry eyes)., Disp: , Rfl:    valsartan (DIOVAN) 320 MG tablet, Take 1 tablet (320 mg total) by mouth daily., Disp: 90 tablet, Rfl: 3  Social History   Tobacco Use  Smoking Status Never  Smokeless Tobacco Never    Allergies  Allergen Reactions   Shellfish Allergy Anaphylaxis    Throat swelling   Flexeril [  Cyclobenzaprine] Hypertension   Objective:  There were no vitals filed for this visit. There is no height or weight on file to calculate BMI. Constitutional Well developed. Well nourished.  Vascular Dorsalis pedis pulses faintly palpable bilaterally. Posterior tibial pulses faintly palpable bilaterally. Capillary refill normal to all digits.  No cyanosis or clubbing noted. Pedal hair growth normal.  Neurologic Normal speech. Oriented to person, place, and time. Protective sensation absent  Dermatologic Wound Location: Left submetatarsal 2 ulceration limited to the breakdown of the skin Wound Base: Granular/Healthy Peri-wound: Normal Exudate: None: wound tissue dry Wound Measurements: -See below  Orthopedic: No pain to palpation either foot.    Radiographs: None Assessment:   1. Diabetes mellitus type 2, insulin dependent ([place])   2. 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  Ulcer left submetatarsal 2 fat layer exposed -Debridement as below. -Dressed with Betadine wet-to-dry, DSD. -Continue off-loading with surgical shoe. -Patient had recent vascular intervention done with restoration of flow. Left lower Extremity:  Fairly normal left common femoral artery, profunda femoris artery, and superficial femoral and popliteal arteries.  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  There was a fairly normal tibial trifurcation with heavily diseased and multiple segments of occlusion and a small anterior tibial artery which was continuous to the foot.  The peroneal artery had a moderate to long segment occlusion from the proximal segment but did reconstitute through collaterals in the mid to distal segment.  The posterior tibial artery occluded in the proximal segment and did not reconstitute distally.  The tibial vessels were seen better with a catheter placement more distal, and advanced a Kumpe catheter into the proximal anterior tibial artery to opacify distally to see that it was continuous to the foot prior to intervention.  Procedure: Excisional Debridement of Wound Tool: Sharp chisel blade/tissue nipper Rationale: Removal of non-viable soft tissue from the wound to promote healing.  Anesthesia: none Pre-Debridement Wound Measurements: 0.4 cm x 0.2 cm x 0.1 cm  Post-Debridement Wound Measurements: 0.5 cm x 0.2 cm x 2.1 cm  Type of Debridement: Sharp Excisional Tissue Removed: Non-viable soft tissue Blood loss: Minimal (<50cc) Depth of Debridement: subcutaneous tissue. Technique: Sharp excisional debridement to bleeding, viable wound base.  Wound Progress: This is initial evaluation of the wound.  We will continue to monitor the progression of it. Dressing:  Dry, sterile, compression dressing. Disposition: Patient tolerated procedure well. Patient to return in 1 week for follow-up.  No follow-ups on file.

## 2021-12-17 ENCOUNTER — Ambulatory Visit: Payer: Medicare Other | Admitting: Podiatry

## 2021-12-17 ENCOUNTER — Other Ambulatory Visit: Payer: Self-pay

## 2021-12-17 DIAGNOSIS — E119 Type 2 diabetes mellitus without complications: Secondary | ICD-10-CM

## 2021-12-17 DIAGNOSIS — Z794 Long term (current) use of insulin: Secondary | ICD-10-CM | POA: Diagnosis not present

## 2021-12-17 DIAGNOSIS — L97522 Non-pressure chronic ulcer of other part of left foot with fat layer exposed: Secondary | ICD-10-CM | POA: Diagnosis not present

## 2021-12-17 NOTE — Progress Notes (Signed)
?Subjective:  ?Patient ID: Carrie Kane, female    DOB: 03-14-1941,  MRN: [id number] ? ?Chief Complaint  ?Patient presents with  ? Callouses  ?  Left foot   ? ? ?81 y.o. female presents for wound care.  Patient presents with follow-up to left submetatarsal 2 ulceration recurrence.  She states she is doing a lot better.  She has been keeping covered.  She is a diabetic.  She denies any other acute complaints. ? ?Review of Systems: Negative except as noted in the HPI. Denies N/V/F/Ch. ? ?Past Medical History:  ?Diagnosis Date  ? CAD (coronary artery disease)   ? a. 04/2017 Cath: LM 80, LAD 80p, 36m, LCX 95ost, 59m, EF 45-50%; b. 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  ? Diastolic dysfunction   ? a. 04/2017 Echo: EF 55-60%, no rwma, Gr1 DD, mildly dil LA.  ? Hyperlipidemia   ? Hypertensive heart disease   ? Insulin dependent diabetes mellitus   ? ? ?Current Outpatient Medications:  ?  aspirin EC 81 MG tablet, Take 1 tablet (81 mg total) by mouth daily., Disp: 90 tablet, Rfl: 3 ?  atorvastatin (LIPITOR) 40 MG tablet, Take 1 tablet (40 mg total) by mouth daily., Disp: 90 tablet, Rfl: 3 ?  BD INSULIN SYRINGE U/F 30G X 1/2" 0.5 ML MISC, , Disp: , Rfl:  ?  Chlorpheniramine Maleate (ALLERGY PO), Take 1 tablet by mouth daily as needed (allergies)., Disp: , Rfl:  ?  cholecalciferol (VITAMIN D) 1000 units tablet, Take 2,000 Units by mouth daily at 12 noon. , Disp: , Rfl:  ?  cloNIDine (CATAPRES) 0.2 MG tablet, Take 1 tablet (0.2 mg total) by mouth 2 (two) times daily., Disp: 90 tablet, Rfl: 3 ?  clopidogrel (PLAVIX) 75 MG tablet, Take 1 tablet (75 mg total) by mouth daily., Disp: 90 tablet, Rfl: 3 ?  ferrous sulfate 325 (65 FE) MG EC tablet, Take 1 tablet (325 mg total) by mouth daily at 2 PM., Disp: 90 tablet, Rfl: 3 ?  HUMALOG KWIKPEN 100 UNIT/ML KwikPen, INJECT 38 UNITS UNDER THE SKIN DAILY, Disp: 45 mL, Rfl: 2 ?  HUMALOG MIX 75/25 KWIKPEN (75-25) 100 UNIT/ML Kwikpen, Inject 45 Units into the skin daily at 2 PM.,  Disp: 15 mL, Rfl: 3 ?  hydrochlorothiazide (MICROZIDE) 12.5 MG capsule, Take 1 capsule (12.5 mg total) by mouth daily., Disp: 90 capsule, Rfl: 3 ?  insulin glargine (LANTUS) 100 UNIT/ML injection, INJECT 45 UNITS UNDER THE SKIN AT BEDTIME, Disp: 40 mL, Rfl: 3 ?  levothyroxine (SYNTHROID) 100 MCG tablet, Take 1 tablet (100 mcg total) by mouth daily before breakfast., Disp: 90 tablet, Rfl: 3 ?  Multiple Vitamin (MULTIVITAMIN) capsule, Take 1 capsule by mouth daily at 12 noon. , Disp: , Rfl:  ?  nitroGLYCERIN (NITROSTAT) 0.4 MG SL tablet, Place 1 tablet (0.4 mg total) under the tongue every 5 (five) minutes as needed for chest pain., Disp: 25 tablet, Rfl: 3 ?  Polyethyl Glycol-Propyl Glycol (SYSTANE OP), Apply 1 drop to eye as needed (dry eyes)., Disp: , Rfl:  ?  valsartan (DIOVAN) 320 MG tablet, Take 1 tablet (320 mg total) by mouth daily., Disp: 90 tablet, Rfl: 3 ? ?Social History  ? ?Tobacco Use  ?Smoking Status Never  ?Smokeless Tobacco Never  ? ? ?Allergies  ?Allergen Reactions  ? Shellfish Allergy Anaphylaxis  ?  Throat swelling  ? Flexeril [Cyclobenzaprine] Hypertension  ? ?Objective:  ?There were no vitals filed for this visit. ?There is no height or  weight on file to calculate BMI. ?Constitutional Well developed. ?Well nourished.  ?Vascular Dorsalis pedis pulses faintly palpable bilaterally. ?Posterior tibial pulses faintly palpable bilaterally. ?Capillary refill normal to all digits.  ?No cyanosis or clubbing noted. ?Pedal hair growth normal.  ?Neurologic Normal speech. ?Oriented to person, place, and time. ?Protective sensation absent  ?Dermatologic Left submetatarsal 2 ulceration has completely reepithelialized.  No clinical signs of breakdown noted.  No infection noted.  ?Orthopedic: No pain to palpation either foot.  ? ?Radiographs: None ?Assessment:  ? ?No diagnosis found. ? ? ? ? ?Plan:  ?Patient was evaluated and treated and all questions answered. ? ?Ulcer left submetatarsal 2 fat layer  exposed ?-Clinically healed.  Good reepithelialization noted.  No signs of recurrence noted.  Patient can return to regular shoes.  If any foot and ankle issues arise future asked her to come back and see me. ?-Patient had recent vascular intervention done with restoration of flow. Left lower Extremity:  Fairly normal left common femoral artery, profunda femoris artery, and superficial femoral and popliteal arteries.  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  There was a fairly normal tibial trifurcation with heavily diseased and multiple segments of occlusion and a small anterior tibial artery which was continuous to the foot.  The peroneal artery had a moderate to long segment occlusion from the proximal segment but did reconstitute through collaterals in the mid to distal segment.  The posterior tibial artery occluded in the proximal segment and did not reconstitute distally.  The tibial vessels were seen better with a catheter placement more distal, and advanced a Kumpe catheter into the proximal anterior tibial artery to opacify distally to see that it was continuous to the foot prior to intervention. ? ? ? ?No follow-ups on file. ? ?  ?

## 2022-01-11 ENCOUNTER — Ambulatory Visit: Payer: Medicare Other | Admitting: Internal Medicine

## 2022-01-11 ENCOUNTER — Other Ambulatory Visit (INDEPENDENT_AMBULATORY_CARE_PROVIDER_SITE_OTHER): Payer: Self-pay | Admitting: Nurse Practitioner

## 2022-01-11 ENCOUNTER — Telehealth (INDEPENDENT_AMBULATORY_CARE_PROVIDER_SITE_OTHER): Payer: Self-pay | Admitting: Vascular Surgery

## 2022-01-11 DIAGNOSIS — Z9889 Other specified postprocedural states: Secondary | ICD-10-CM

## 2022-01-11 NOTE — Telephone Encounter (Signed)
Patient can come in for abi see Arna Medici or [facility]boy this week per Eulogio Ditch NP ?

## 2022-01-11 NOTE — Telephone Encounter (Signed)
Spoke with pt - she is scheduled tomorrow for abi and see fb. Nothing further needed at this time.  ?

## 2022-01-11 NOTE — Telephone Encounter (Signed)
Pt states that on Saturday, both legs started hurting - pain level 9 / 10. Little swelling - able to walk. Please advise  ?

## 2022-01-12 ENCOUNTER — Other Ambulatory Visit: Payer: Self-pay

## 2022-01-12 ENCOUNTER — Ambulatory Visit (INDEPENDENT_AMBULATORY_CARE_PROVIDER_SITE_OTHER): Payer: Medicare Other | Admitting: Nurse Practitioner

## 2022-01-12 ENCOUNTER — Encounter (INDEPENDENT_AMBULATORY_CARE_PROVIDER_SITE_OTHER): Payer: Self-pay | Admitting: Nurse Practitioner

## 2022-01-12 ENCOUNTER — Ambulatory Visit (INDEPENDENT_AMBULATORY_CARE_PROVIDER_SITE_OTHER): Payer: Medicare Other

## 2022-01-12 VITALS — BP 153/68 | HR 74 | Resp 16 | Ht 68.0 in | Wt 162.4 lb

## 2022-01-12 DIAGNOSIS — I7025 Atherosclerosis of native arteries of other extremities with ulceration: Secondary | ICD-10-CM | POA: Diagnosis not present

## 2022-01-12 DIAGNOSIS — Z9889 Other specified postprocedural states: Secondary | ICD-10-CM | POA: Diagnosis not present

## 2022-01-12 DIAGNOSIS — E782 Mixed hyperlipidemia: Secondary | ICD-10-CM

## 2022-01-12 DIAGNOSIS — I10 Essential (primary) hypertension: Secondary | ICD-10-CM | POA: Diagnosis not present

## 2022-01-12 DIAGNOSIS — I739 Peripheral vascular disease, unspecified: Secondary | ICD-10-CM | POA: Diagnosis not present

## 2022-01-18 ENCOUNTER — Ambulatory Visit (INDEPENDENT_AMBULATORY_CARE_PROVIDER_SITE_OTHER): Payer: Medicare Other | Admitting: Internal Medicine

## 2022-01-18 ENCOUNTER — Encounter: Payer: Self-pay | Admitting: Internal Medicine

## 2022-01-18 VITALS — BP 156/81 | HR 66 | Ht 68.0 in | Wt 161.2 lb

## 2022-01-18 DIAGNOSIS — I25118 Atherosclerotic heart disease of native coronary artery with other forms of angina pectoris: Secondary | ICD-10-CM | POA: Diagnosis not present

## 2022-01-18 DIAGNOSIS — Z794 Long term (current) use of insulin: Secondary | ICD-10-CM | POA: Diagnosis not present

## 2022-01-18 DIAGNOSIS — I10 Essential (primary) hypertension: Secondary | ICD-10-CM

## 2022-01-18 DIAGNOSIS — I739 Peripheral vascular disease, unspecified: Secondary | ICD-10-CM

## 2022-01-18 DIAGNOSIS — E119 Type 2 diabetes mellitus without complications: Secondary | ICD-10-CM | POA: Diagnosis not present

## 2022-01-18 LAB — GLUCOSE, POCT (MANUAL RESULT ENTRY): POC Glucose: 108 mg/dl — AB (ref 70–99)

## 2022-01-18 NOTE — Assessment & Plan Note (Signed)
Stable at the present time. 

## 2022-01-18 NOTE — Assessment & Plan Note (Signed)
Patient is referred back to Dr. Lucky Cowboy for further evaluation and work-up ?

## 2022-01-18 NOTE — Progress Notes (Signed)
? ?Established Patient Office Visit ? ?Subjective:  ?Patient ID: Carrie Kane, female    DOB: Sep 08, 1941  Age: 81 y.o. MRN: [id number] ? ?CC:  ?Chief Complaint  ?Patient presents with  ? Lab Results  ? ? ?Leg Pain  ?The incident occurred more than 1 week ago. There was no injury mechanism. The pain is present in the right leg. The pain is at a severity of 6/10. The pain is moderate.  ? ?Carrie Kane presents for pain rt leg ? ?Past Medical History:  ?Diagnosis Date  ? CAD (coronary artery disease)   ? a. 04/2017 Cath: LM 80, LAD 80p, 90m LCX 95ost, 753mEF 45-50%; b. 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  ? Diastolic dysfunction   ? a. 04/2017 Echo: EF 55-60%, no rwma, Gr1 DD, mildly dil LA.  ? Hyperlipidemia   ? Hypertensive heart disease   ? Insulin dependent diabetes mellitus   ? ? ?Past Surgical History:  ?Procedure Laterality Date  ? ABDOMINAL HYSTERECTOMY    ? CATARACT EXTRACTION W/PHACO Right 10/26/2021  ? Procedure: CATARACT EXTRACTION PHACO AND INTRAOCULAR LENS PLACEMENT (IOC) RIGHT DIABETIC 16.27 01:23.8;  Surgeon: KiEulogio BearMD;  [place] [facility]: Ophthalmology;  Laterality: Right;  Please leave arrival at 8:00  ? CATARACT EXTRACTION W/PHACO Left 11/09/2021  ? Procedure: CATARACT EXTRACTION PHACO AND INTRAOCULAR LENS PLACEMENT ([place] DIABETIC;  Surgeon: KiEulogio BearMD;  [place]: [facility]: Ophthalmology;  Laterality: Left;  13.39 ?01:13.0  ? CORONARY ARTERY BYPASS GRAFT N/A 05/09/2017  ? Procedure: CORONARY ARTERY BYPASS GRAFTING (CABG) x 3 using left internal mammary artery and right greater saphenous vein harvested endoscopically;  Surgeon: VaIvin PootMD;  [place] [facility]: [facility];  Laterality: N/A;  ? INTRAOPERATIVE TRANSESOPHAGEAL ECHOCARDIOGRAM N/A 05/09/2017  ? Procedure: INTRAOPERATIVE TRANSESOPHAGEAL ECHOCARDIOGRAM;  Surgeon: VaIvin PootMD;  [facility]: Open [facility];   Laterality: N/A;  ? IR RADIOLOGIST EVAL & MGMT  12/04/2020  ? LEFT HEART CATH AND CORONARY ANGIOGRAPHY N/A 05/02/2017  ? Procedure: Left Heart Cath and Coronary Angiography;  Surgeon: ArWellington HampshireMD;  [facility];  Service: Cardiovascular;  Laterality: N/A;  ? LOWER EXTREMITY ANGIOGRAPHY Left 02/09/2021  ? Procedure: LOWER EXTREMITY ANGIOGRAPHY;  Surgeon: DeAlgernon HuxleyMD;  [facility];  Service: Cardiovascular;  Laterality: Left;  ? LOWER EXTREMITY ANGIOGRAPHY Right 02/16/2021  ? Procedure: LOWER EXTREMITY ANGIOGRAPHY;  Surgeon: DeAlgernon HuxleyMD;  [facility];  Service: Cardiovascular;  Laterality: Right;  ? ? ?Family History  ?Problem Relation Age of Onset  ? Breast cancer Maternal Aunt   ? ? ?Social History  ? ?Socioeconomic History  ? Marital status: Married  ?  Spouse name: RoHerbie [place] ? Number of children: 1  ? Years of education: Not on file  ? Highest education level: Not on file  ?Occupational History  ? Not on file  ?Tobacco Use  ? Smoking status: Never  ? Smokeless tobacco: Never  ?Substance and Sexual Activity  ? Alcohol use: No  ? Drug use: No  ? Sexual activity: Not on file  ?Other Topics Concern  ? Not on file  ?Social History Narrative  ? Lives at home with spouse   ? ?Social Determinants of Health  ? ?Financial Resource Strain: Low Risk   ? Difficulty of Paying Living Expenses: Not hard at all  ?Food Insecurity:  No Food Insecurity  ? Worried About Charity fundraiser in the Last Year: Never true  ? Ran Out of Food in the Last Year: Never true  ?Transportation Needs: No Transportation Needs  ? Lack of Transportation (Medical): No  ? Lack of Transportation (Non-Medical): No  ?Physical Activity: Sufficiently Active  ? Days of Exercise per Week: 4 days  ? Minutes of Exercise per Session: 40 min  ?Stress: No Stress Concern Present  ? Feeling of Stress : Not at all  ?Social Connections: Socially Integrated  ? Frequency of Communication with Friends  and Family: More than three times a week  ? Frequency of Social Gatherings with Friends and Family: More than three times a week  ? Attends Religious Services: More than 4 times per year  ? Active Member of Clubs or Organizations: Yes  ? Attends Archivist Meetings: More than 4 times per year  ? Marital Status: Married  ?Intimate Partner Violence: Not At Risk  ? Fear of Current or Ex-Partner: No  ? Emotionally Abused: No  ? Physically Abused: No  ? Sexually Abused: No  ? ? ? ?Current Outpatient Medications:  ?  aspirin EC 81 MG tablet, Take 1 tablet (81 mg total) by mouth daily., Disp: 90 tablet, Rfl: 3 ?  atorvastatin (LIPITOR) 40 MG tablet, Take 1 tablet (40 mg total) by mouth daily., Disp: 90 tablet, Rfl: 3 ?  BD INSULIN SYRINGE U/F 30G X 1/2" 0.5 ML MISC, , Disp: , Rfl:  ?  Chlorpheniramine Maleate (ALLERGY PO), Take 1 tablet by mouth daily as needed (allergies)., Disp: , Rfl:  ?  cholecalciferol (VITAMIN D) 1000 units tablet, Take 2,000 Units by mouth daily at 12 noon. , Disp: , Rfl:  ?  cloNIDine (CATAPRES) 0.2 MG tablet, Take 1 tablet (0.2 mg total) by mouth 2 (two) times daily., Disp: 90 tablet, Rfl: 3 ?  clopidogrel (PLAVIX) 75 MG tablet, Take 1 tablet (75 mg total) by mouth daily., Disp: 90 tablet, Rfl: 3 ?  ferrous sulfate 325 (65 FE) MG EC tablet, Take 1 tablet (325 mg total) by mouth daily at 2 PM., Disp: 90 tablet, Rfl: 3 ?  HUMALOG KWIKPEN 100 UNIT/ML KwikPen, INJECT 38 UNITS UNDER THE SKIN DAILY, Disp: 45 mL, Rfl: 2 ?  HUMALOG MIX 75/25 KWIKPEN (75-25) 100 UNIT/ML Kwikpen, Inject 45 Units into the skin daily at 2 PM., Disp: 15 mL, Rfl: 3 ?  insulin glargine (LANTUS) 100 UNIT/ML injection, INJECT 45 UNITS UNDER THE SKIN AT BEDTIME, Disp: 40 mL, Rfl: 3 ?  levothyroxine (SYNTHROID) 100 MCG tablet, Take 1 tablet (100 mcg total) by mouth daily before breakfast., Disp: 90 tablet, Rfl: 3 ?  Multiple Vitamin (MULTIVITAMIN) capsule, Take 1 capsule by mouth daily at 12 noon. , Disp: , Rfl:  ?   nitroGLYCERIN (NITROSTAT) 0.4 MG SL tablet, Place 1 tablet (0.4 mg total) under the tongue every 5 (five) minutes as needed for chest pain., Disp: 25 tablet, Rfl: 3 ?  Polyethyl Glycol-Propyl Glycol (SYSTANE OP), Apply 1 drop to eye as needed (dry eyes)., Disp: , Rfl:  ?  valsartan (DIOVAN) 320 MG tablet, Take 1 tablet (320 mg total) by mouth daily., Disp: 90 tablet, Rfl: 3  ? ?Allergies  ?Allergen Reactions  ? Shellfish Allergy Anaphylaxis  ?  Throat swelling  ? Flexeril [Cyclobenzaprine] Hypertension  ? ? ?ROS ?Review of Systems  ?Constitutional: Negative.   ?HENT: Negative.    ?Eyes: Negative.   ?Respiratory: Negative.    ?  Cardiovascular: Negative.   ?Gastrointestinal: Negative.   ?Endocrine: Negative.   ?Genitourinary: Negative.   ?Musculoskeletal:  Positive for gait problem and myalgias.  ?     Claudication  ?Skin: Negative.   ?Allergic/Immunologic: Negative.   ?Hematological: Negative.   ?Psychiatric/Behavioral: Negative.    ?All other systems reviewed and are negative. ? ?  ?Objective:  ?  ?Physical Exam ?Vitals reviewed.  ?Constitutional:   ?   Appearance: Normal appearance.  ?HENT:  ?   Mouth/Throat:  ?   Mouth: Mucous membranes are moist.  ?Eyes:  ?   Pupils: Pupils are equal, round, and reactive to light.  ?Neck:  ?   Vascular: No carotid bruit.  ?Cardiovascular:  ?   Rate and Rhythm: Normal rate and regular rhythm.  ?   Pulses: Normal pulses.  ?   Heart sounds: Normal heart sounds.  ?Pulmonary:  ?   Effort: Pulmonary effort is normal.  ?   Breath sounds: Normal breath sounds.  ?Abdominal:  ?   General: Bowel sounds are normal.  ?   Palpations: Abdomen is soft. There is no hepatomegaly, splenomegaly or mass.  ?   Tenderness: There is no abdominal tenderness.  ?   Hernia: No hernia is present.  ?Musculoskeletal:     ?   General: No tenderness.  ?   Cervical back: Neck supple.  ?   Right lower leg: No edema.  ?   Left lower leg: No edema.  ?Skin: ?   Findings: No rash.  ?Neurological:  ?   Mental Status:  She is alert and oriented to person, place, and time.  ?   Motor: No weakness.  ?Psychiatric:     ?   Mood and Affect: Mood and affect normal.     ?   Behavior: Behavior normal.  ? ? ?BP (!) 156/81   Pulse 66   Ht

## 2022-01-18 NOTE — Assessment & Plan Note (Signed)

## 2022-01-18 NOTE — Assessment & Plan Note (Signed)
Blood pressure is under control 

## 2022-01-24 ENCOUNTER — Encounter (INDEPENDENT_AMBULATORY_CARE_PROVIDER_SITE_OTHER): Payer: Self-pay | Admitting: Nurse Practitioner

## 2022-01-24 NOTE — Progress Notes (Signed)
? ?Subjective:  ? ? Patient ID: Carrie Kane, female    DOB: July 22, 1941, 81 y.o.   MRN: [id number] ?Chief Complaint  ?Patient presents with  ? Follow-up  ?  ultrasound  ? ? ?Carrie Kane is a 81 year old female that presents today for evaluation of pain in her lower extremities.  The patient contacted our office noting that it is very painful for her to walk.  These are consistent with claudication-like symptoms.  The patient has previously had revascularization done on her bilateral lower extremities.  She previously had an ulceration on the ball of her left foot however that has closed and callused over at this point in time.  She notes that the right tends to be worse than the left.  She has mild rest pain like symptoms.  There do not appear to be any active open wounds or ulcerations. ? ?Today noninvasive study showed an ABI of 0.76 on the right and 0.78 on the left.  She has a TBI of 0.22 on the right and 0.16 on the left.  She has monophasic tibial artery waveforms with severely dampened toe waveforms bilaterally ? ? ?Review of Systems  ?Cardiovascular:   ?     Claudication  ?All other systems reviewed and are negative. ? ?   ?Objective:  ? Physical Exam ?Vitals reviewed.  ?HENT:  ?   Head: Normocephalic.  ?Cardiovascular:  ?   Rate and Rhythm: Normal rate.  ?   Pulses:     ?     Dorsalis pedis pulses are detected w/ Doppler on the right side and detected w/ Doppler on the left side.  ?     Posterior tibial pulses are detected w/ Doppler on the right side and detected w/ Doppler on the left side.  ?Pulmonary:  ?   Effort: Pulmonary effort is normal.  ?Skin: ?   General: Skin is warm and dry.  ?Neurological:  ?   Mental Status: She is alert and oriented to person, place, and time.  ?Psychiatric:     ?   Mood and Affect: Mood normal.     ?   Behavior: Behavior normal.     ?   Thought Content: Thought content normal.     ?   Judgment: Judgment normal.  ? ? ?BP (!) 153/68 (BP Location: Right Arm)   Pulse 74    Resp 16   Ht '5\' 8"'$  (1.727 m)   Wt 162 lb 6.4 oz (73.7 kg)   BMI 24.69 kg/m?  ? ?Past Medical History:  ?Diagnosis Date  ? CAD (coronary artery disease)   ? a. 04/2017 Cath: LM 80, LAD 80p, 22m LCX 95ost, 730mEF 45-50%; b. 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  ? Diastolic dysfunction   ? a. 04/2017 Echo: EF 55-60%, no rwma, Gr1 DD, mildly dil LA.  ? Hyperlipidemia   ? Hypertensive heart disease   ? Insulin dependent diabetes mellitus   ? ? ?Social History  ? ?Socioeconomic History  ? Marital status: Married  ?  Spouse name: RoHerbie [place] ? Number of children: 1  ? Years of education: Not on file  ? Highest education level: Not on file  ?Occupational History  ? Not on file  ?Tobacco Use  ? Smoking status: Never  ? Smokeless tobacco: Never  ?Substance and Sexual Activity  ? Alcohol use: No  ? Drug use: No  ? Sexual activity: Not on file  ?Other Topics Concern  ? Not on file  ?  Social History Narrative  ? Lives at home with spouse   ? ?Social Determinants of Health  ? ?Financial Resource Strain: Low Risk   ? Difficulty of Paying Living Expenses: Not hard at all  ?Food Insecurity: No Food Insecurity  ? Worried About Charity fundraiser in the Last Year: Never true  ? Ran Out of Food in the Last Year: Never true  ?Transportation Needs: No Transportation Needs  ? Lack of Transportation (Medical): No  ? Lack of Transportation (Non-Medical): No  ?Physical Activity: Sufficiently Active  ? Days of Exercise per Week: 4 days  ? Minutes of Exercise per Session: 40 min  ?Stress: No Stress Concern Present  ? Feeling of Stress : Not at all  ?Social Connections: Socially Integrated  ? Frequency of Communication with Friends and Family: More than three times a week  ? Frequency of Social Gatherings with Friends and Family: More than three times a week  ? Attends Religious Services: More than 4 times per year  ? Active Member of Clubs or Organizations: Yes  ? Attends Archivist Meetings: More than 4 times per year  ?  Marital Status: Married  ?Intimate Partner Violence: Not At Risk  ? Fear of Current or Ex-Partner: No  ? Emotionally Abused: No  ? Physically Abused: No  ? Sexually Abused: No  ? ? ?Past Surgical History:  ?Procedure Laterality Date  ? ABDOMINAL HYSTERECTOMY    ? CATARACT EXTRACTION W/PHACO Right 10/26/2021  ? Procedure: CATARACT EXTRACTION PHACO AND INTRAOCULAR LENS PLACEMENT (IOC) RIGHT DIABETIC 16.27 01:23.8;  Surgeon: Eulogio Bear, MD;  Location: [place];  Service: Ophthalmology;  Laterality: Right;  Please leave arrival at 8:00  ? CATARACT EXTRACTION W/PHACO Left 11/09/2021  ? Procedure: CATARACT EXTRACTION PHACO AND INTRAOCULAR LENS PLACEMENT ([place]) LEFT DIABETIC;  Surgeon: Eulogio Bear, MD;  Location: [place];  Service: Ophthalmology;  Laterality: Left;  13.39 ?01:13.0  ? CORONARY ARTERY BYPASS GRAFT N/A 05/09/2017  ? Procedure: CORONARY ARTERY BYPASS GRAFTING (CABG) x 3 using left internal mammary artery and right greater saphenous vein harvested endoscopically;  Surgeon: Ivin Poot, MD;  Location: [place];  Service: Open Heart Surgery;  Laterality: N/A;  ? INTRAOPERATIVE TRANSESOPHAGEAL ECHOCARDIOGRAM N/A 05/09/2017  ? Procedure: INTRAOPERATIVE TRANSESOPHAGEAL ECHOCARDIOGRAM;  Surgeon: Ivin Poot, MD;  Location: [place];  Service: Open Heart Surgery;  Laterality: N/A;  ? IR RADIOLOGIST EVAL & MGMT  12/04/2020  ? LEFT HEART CATH AND CORONARY ANGIOGRAPHY N/A 05/02/2017  ? Procedure: Left Heart Cath and Coronary Angiography;  Surgeon: Wellington Hampshire, MD;  Location: [facility];  Service: Cardiovascular;  Laterality: N/A;  ? LOWER EXTREMITY ANGIOGRAPHY Left 02/09/2021  ? Procedure: LOWER EXTREMITY ANGIOGRAPHY;  Surgeon: Algernon Huxley, MD;  Location: [facility] [facility];  Service: Cardiovascular;  Laterality: Left;  ? LOWER EXTREMITY ANGIOGRAPHY Right 02/16/2021  ? Procedure: LOWER EXTREMITY ANGIOGRAPHY;  Surgeon: Algernon Huxley, MD;  Location: [facility] [facility];   Service: Cardiovascular;  Laterality: Right;  ? ? ?Family History  ?Problem Relation Age of Onset  ? Breast cancer Maternal Aunt   ? ? ?Allergies  ?Allergen Reactions  ? Shellfish Allergy Anaphylaxis  ?  Throat swelling  ? Flexeril [Cyclobenzaprine] Hypertension  ? ? ? ?  Latest Ref Rng & Units 08/25/2021  ? 10:42 AM 08/11/2020  ? 10:58 AM 06/25/2020  ? 12:07 PM  ?CBC  ?WBC 3.4 - 10.8 x10E3/uL 4.4   5.5  4.7    ?Hemoglobin 11.1 - 15.9 g/dL 12.1   12.0   11.2    ?Hematocrit 34.0 - 46.6 % 37.6   37.3   33.7    ?Platelets 150 - 450 x10E3/uL 212   227   204    ? ? ? ? ?CMP  ?   ?Component Value Date/Time  ? NA 140 08/25/2021 1042  ? K 4.1 08/25/2021 1042  ? CL 100 08/25/2021 1042  ? CO2 28 08/25/2021 1042  ? GLUCOSE 138 (H) 08/25/2021 1042  ? GLUCOSE 282 (H) 08/19/2020 1515  ? BUN 13 08/25/2021 1042  ? CREATININE 0.86 08/25/2021 1042  ? CALCIUM 9.8 08/25/2021 1042  ? PROT 6.7 08/25/2021 1042  ? ALBUMIN 4.3 08/25/2021 1042  ? AST 25 08/25/2021 1042  ? ALT 22 08/25/2021 1042  ? ALKPHOS 82 08/25/2021 1042  ? BILITOT 0.4 08/25/2021 1042  ? GFRNONAA >60 02/16/2021 0757  ? GFRAA 74 08/11/2020 1058  ? ? ? ?VAS [place] ABI WITH/WO TBI ? ?Result Date: 01/18/2022 ? LOWER EXTREMITY DOPPLER STUDY Patient Name:  LOUCILE POSNER  Date of Exam:   01/12/2022 Medical Rec #: [id number]       Accession #:    [id number] Date of Birth: 1940-12-31       Patient Gender: F Patient Age:   38 years Exam Location:  [facility] Procedure:      VAS [place] ABI WITH/WO TBI Referring Phys: Eulogio Ditch --------------------------------------------------------------------------------  Indications: Peripheral artery disease.  Vascular Interventions: 02/09/21: Left ATA angioplasty;                         02/16/21: Right SFA/popliteal & peroneal artery PTAs;. Comparison Study: Duplex on 06/09/21 demonstrated significant bilateral tibial                   level arterial disease Performing Technologist: Blondell Reveal RT, RDMS, RVT  Examination Guidelines: A  complete evaluation includes at minimum, Doppler waveform signals and systolic blood pressure reading at the level of bilateral brachial, anterior tibial, and posterior tibial arteries, when vessel segments are acc

## 2022-01-25 ENCOUNTER — Other Ambulatory Visit: Payer: Self-pay | Admitting: Cardiovascular Disease

## 2022-02-01 ENCOUNTER — Other Ambulatory Visit: Payer: Self-pay

## 2022-02-01 ENCOUNTER — Emergency Department: Payer: Medicare Other

## 2022-02-01 ENCOUNTER — Emergency Department
Admission: EM | Admit: 2022-02-01 | Discharge: 2022-02-02 | Discharge status: Against Medical Advice | Payer: Medicare Other | Attending: Emergency Medicine | Admitting: Emergency Medicine

## 2022-02-01 ENCOUNTER — Encounter: Payer: Self-pay | Admitting: Emergency Medicine

## 2022-02-01 DIAGNOSIS — Z951 Presence of aortocoronary bypass graft: Secondary | ICD-10-CM | POA: Insufficient documentation

## 2022-02-01 DIAGNOSIS — I2510 Atherosclerotic heart disease of native coronary artery without angina pectoris: Secondary | ICD-10-CM | POA: Diagnosis not present

## 2022-02-01 DIAGNOSIS — E119 Type 2 diabetes mellitus without complications: Secondary | ICD-10-CM | POA: Diagnosis not present

## 2022-02-01 DIAGNOSIS — Z7982 Long term (current) use of aspirin: Secondary | ICD-10-CM | POA: Diagnosis not present

## 2022-02-01 DIAGNOSIS — I10 Essential (primary) hypertension: Secondary | ICD-10-CM | POA: Insufficient documentation

## 2022-02-01 DIAGNOSIS — I499 Cardiac arrhythmia, unspecified: Secondary | ICD-10-CM | POA: Diagnosis not present

## 2022-02-01 DIAGNOSIS — Z7901 Long term (current) use of anticoagulants: Secondary | ICD-10-CM | POA: Diagnosis not present

## 2022-02-01 DIAGNOSIS — Z5321 Procedure and treatment not carried out due to patient leaving prior to being seen by health care provider: Secondary | ICD-10-CM | POA: Insufficient documentation

## 2022-02-01 DIAGNOSIS — G459 Transient cerebral ischemic attack, unspecified: Secondary | ICD-10-CM | POA: Diagnosis not present

## 2022-02-01 DIAGNOSIS — R4701 Aphasia: Secondary | ICD-10-CM | POA: Diagnosis present

## 2022-02-01 DIAGNOSIS — E039 Hypothyroidism, unspecified: Secondary | ICD-10-CM | POA: Insufficient documentation

## 2022-02-01 LAB — CBC
HCT: 37.5 % (ref 36.0–46.0)
Hemoglobin: 12 g/dL (ref 12.0–15.0)
MCH: 29.3 pg (ref 26.0–34.0)
MCHC: 32 g/dL (ref 30.0–36.0)
MCV: 91.7 fL (ref 80.0–100.0)
Platelets: 215 10*3/uL (ref 150–400)
RBC: 4.09 MIL/uL (ref 3.87–5.11)
RDW: 13.2 % (ref 11.5–15.5)
WBC: 4.4 10*3/uL (ref 4.0–10.5)
nRBC: 0 % (ref 0.0–0.2)

## 2022-02-01 LAB — COMPREHENSIVE METABOLIC PANEL
ALT: 15 U/L (ref 0–44)
AST: 21 U/L (ref 15–41)
Albumin: 3.9 g/dL (ref 3.5–5.0)
Alkaline Phosphatase: 65 U/L (ref 38–126)
Anion gap: 7 (ref 5–15)
BUN: 23 mg/dL (ref 8–23)
CO2: 28 mmol/L (ref 22–32)
Calcium: 9.1 mg/dL (ref 8.9–10.3)
Chloride: 101 mmol/L (ref 98–111)
Creatinine, Ser: 1 mg/dL (ref 0.44–1.00)
GFR, Estimated: 57 mL/min — ABNORMAL LOW (ref >60)
Glucose, Bld: 301 mg/dL — ABNORMAL HIGH (ref 70–99)
Potassium: 4 mmol/L (ref 3.5–5.1)
Sodium: 136 mmol/L (ref 135–145)
Total Bilirubin: 0.5 mg/dL (ref 0.3–1.2)
Total Protein: 7 g/dL (ref 6.5–8.1)

## 2022-02-01 LAB — PROTIME-INR
INR: 1 (ref 0.8–1.2)
Prothrombin Time: 13 seconds (ref 11.4–15.2)

## 2022-02-01 LAB — DIFFERENTIAL
Abs Immature Granulocytes: 0.01 10*3/uL (ref 0.00–0.07)
Basophils Absolute: 0 10*3/uL (ref 0.0–0.1)
Basophils Relative: 1 %
Eosinophils Absolute: 0.2 10*3/uL (ref 0.0–0.5)
Eosinophils Relative: 5 %
Immature Granulocytes: 0 %
Lymphocytes Relative: 41 %
Lymphs Abs: 1.8 10*3/uL (ref 0.7–4.0)
Monocytes Absolute: 0.2 10*3/uL (ref 0.1–1.0)
Monocytes Relative: 5 %
Neutro Abs: 2.1 10*3/uL (ref 1.7–7.7)
Neutrophils Relative %: 48 %

## 2022-02-01 LAB — APTT: aPTT: 30 seconds (ref 24–36)

## 2022-02-01 IMAGING — CT CT HEAD W/O CM
4 series · 16 of 47 positions shown, 18 images · non-contrast
Comparison: [DATE]

CLINICAL DATA: Slurred speech, right arm shaking, leg weakness for
2 days, symptoms have gradually resolved



[Series 2: head bone · axial · 0.41mm/px · z∈[-54,-22]mm · 3 of 79 slices shown]
[im 8/79  bone]
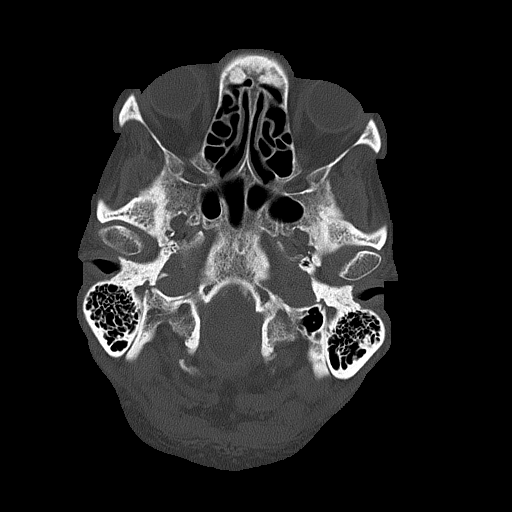
[im 16/79  bone]
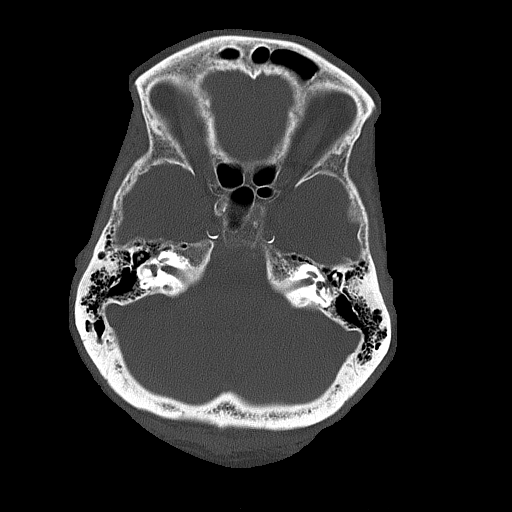
[im 24/79  bone]
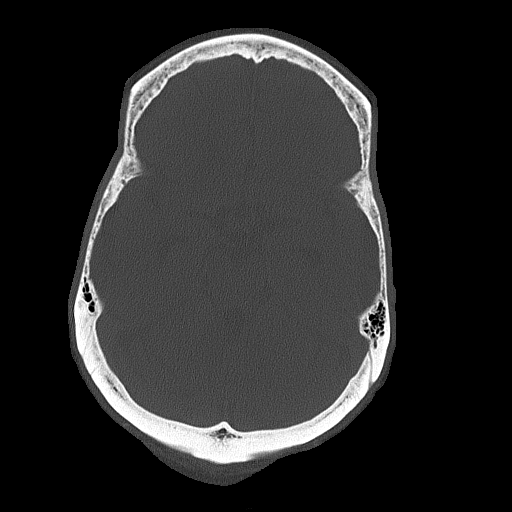

[Series 3: head wo · axial · 0.41mm/px · z∈[-53,+67]mm · 7 of 32 slices shown, 9 images]
[im 4/32  brain]
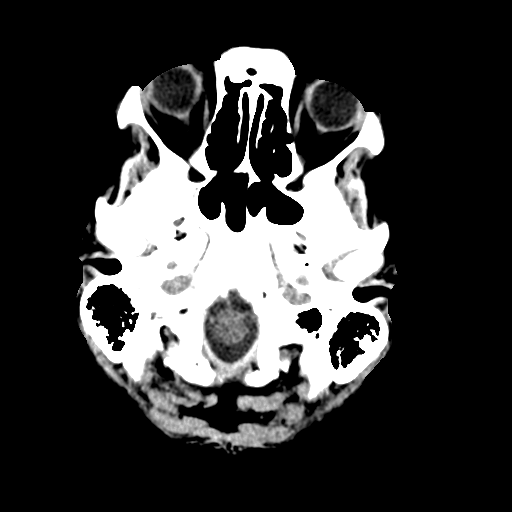
[im 4/32  bone]
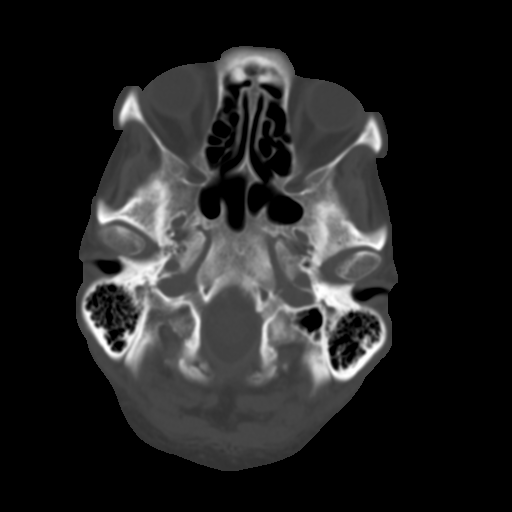
[im 8/32  brain]
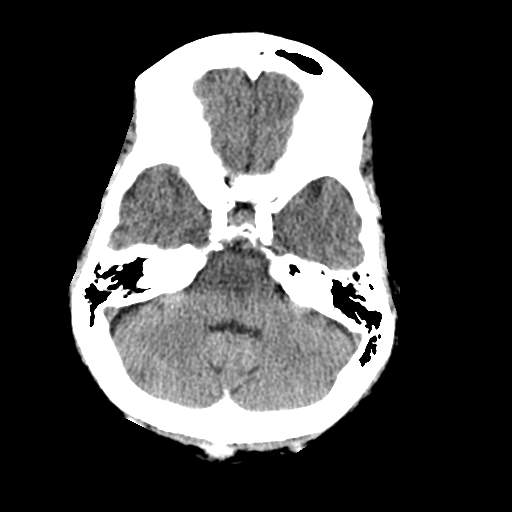
[im 12/32  brain]
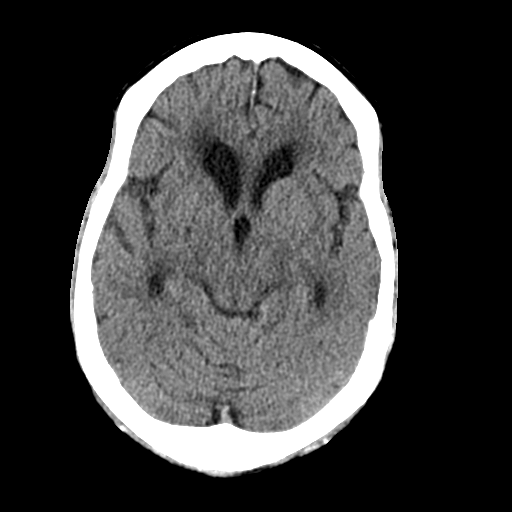
[im 16/32  brain]
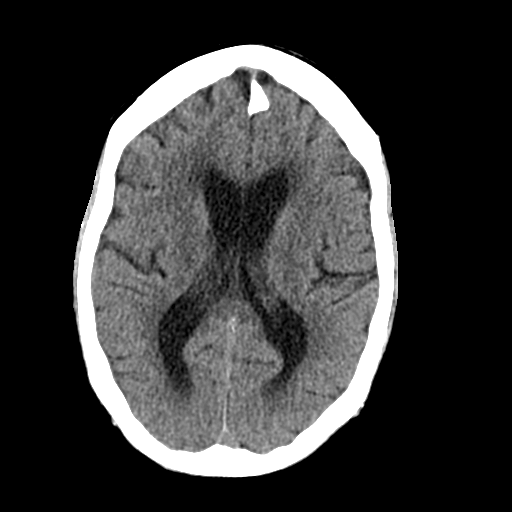
[im 20/32  brain]
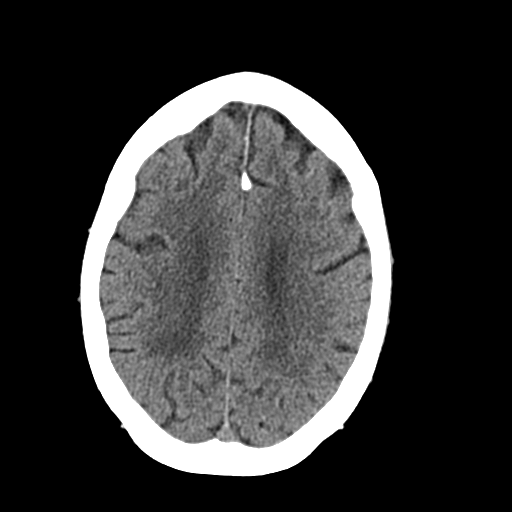
[im 20/32  bone]
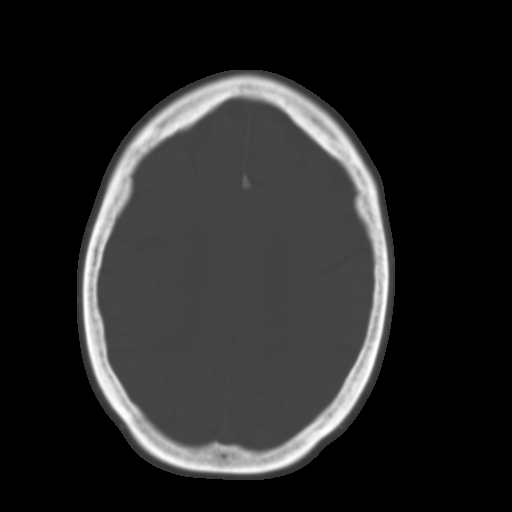
[im 24/32  brain]
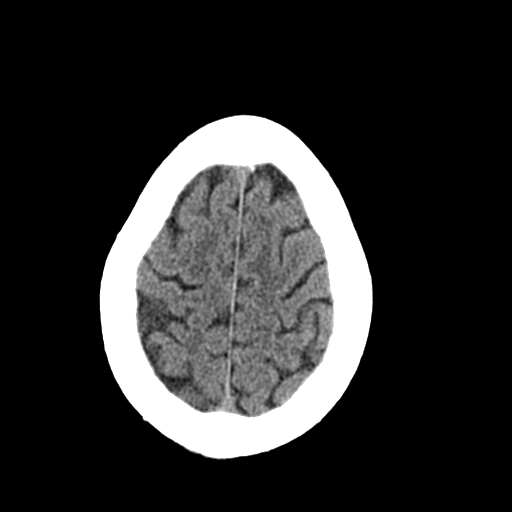
[im 28/32  brain]
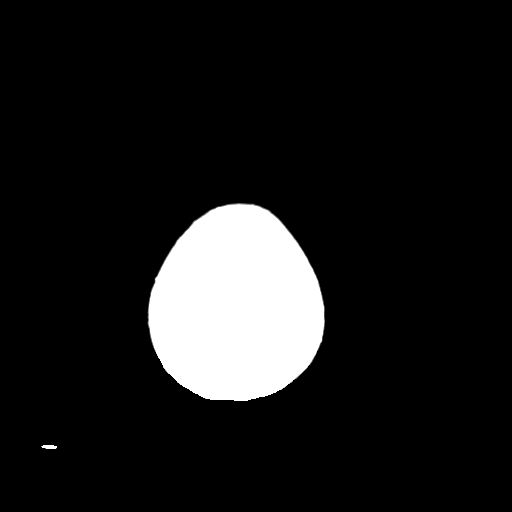

[Series 4: coronal soft tissue · coronal · 0.31mm/px · 3 of 65 slices shown]
[im 22/65  brain]
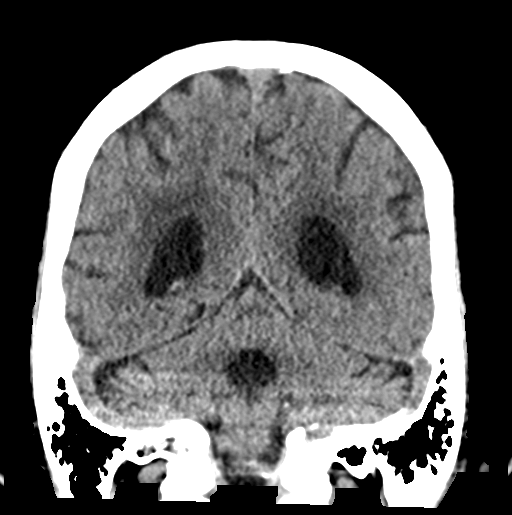
[im 29/65  brain]
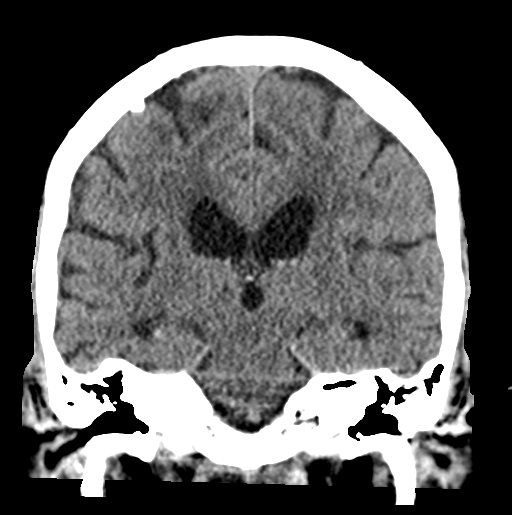
[im 36/65  brain]
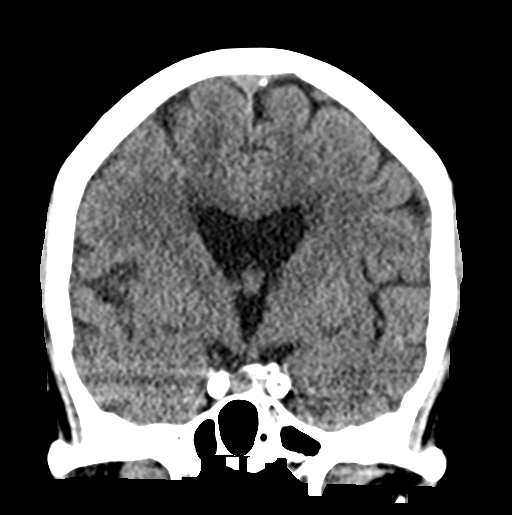

[Series 5: sagittal soft tissue · sagittal · 0.31mm/px · 3 of 53 slices shown]
[im 18/53  brain]
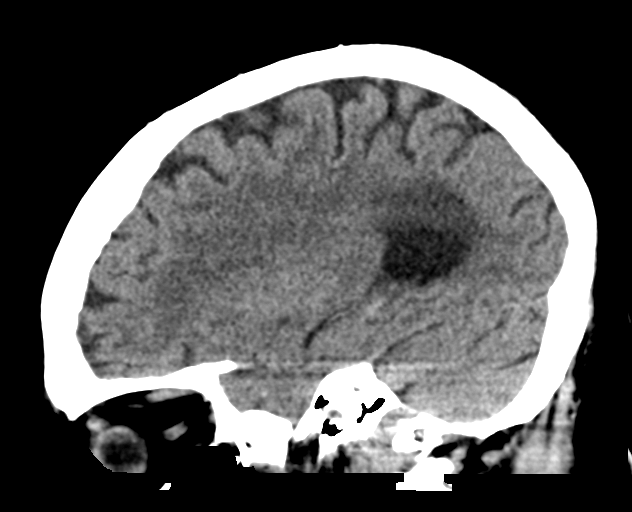
[im 27/53  brain]
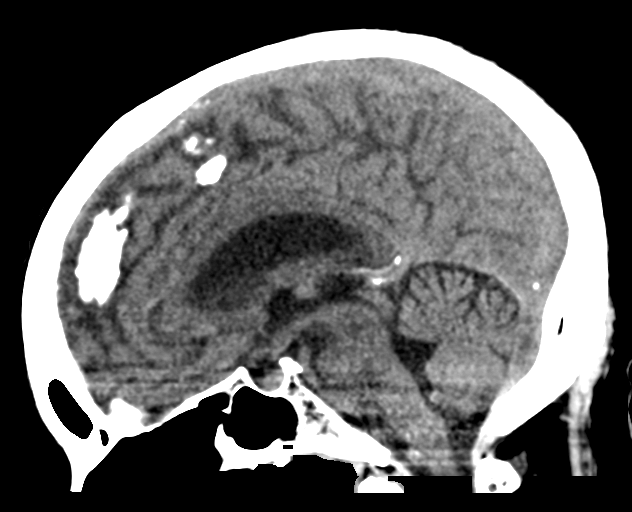
[im 35/53  brain]
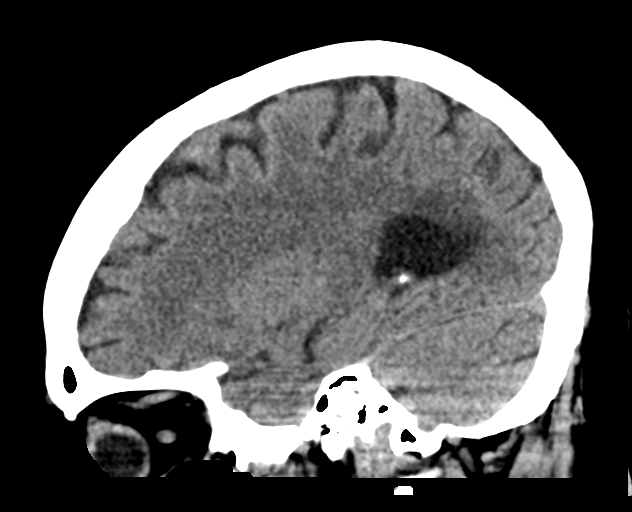

[16 of 47 positions shown; findings below may reference images not displayed]

FINDINGS: Brain: Chronic ischemic changes are seen within the right caudate
nucleus and bilateral periventricular white matter, stable no other
signs of acute infarct or hemorrhage. Lateral ventricles and
remaining midline structures are unremarkable. No acute extra-axial
fluid collections. No mass effect.

Vascular: Stable atherosclerosis of the internal carotid arteries.
No hyperdense vessel.

Skull: Normal. Negative for fracture or focal lesion.

Sinuses/Orbits: No acute finding.

Other: None.
IMPRESSION: 1. Chronic ischemic changes within the right caudate nucleus and
periventricular white matter, stable.
2. No acute intracranial process.

## 2022-02-01 MED ORDER — ASPIRIN 81 MG PO CHEW
324.0000 mg | CHEWABLE_TABLET | Freq: Once | ORAL | Status: AC
  Administered 2022-02-01: 324 mg via ORAL
  Filled 2022-02-01: qty 4

## 2022-02-01 NOTE — ED Notes (Signed)
Report received from Jordan, RN

## 2022-02-01 NOTE — ED Triage Notes (Signed)
Pt with slurred speech, right arm shaking, leg weakness that started Saturday. Symptoms gradually resolved until today pt almost back to normal. [facility] here 0 ?

## 2022-02-01 NOTE — ED Notes (Signed)
Patient has decided that she wants to leave. States she can't be here all night and that she needs to eat, check her blood sugar, and take her insulin despite this RN  offering dinner, checking BGL, and administering insulin accordingly. Pt to sign AMA form and be discharged. ?

## 2022-02-01 NOTE — ED Provider Notes (Addendum)
? ?[facility] ?Provider Note ? ? ? Event Date/Time  ? First MD Initiated Contact with Patient 02/01/22 1645   ?  (approximate) ? ? ?History  ? ?Aphasia ? ? ?HPI ? ?Carrie Kane is a 81 y.o. female with past medical history of coronary artery disease on aspirin and Plavix, hyperlipidemia, hypertension, insulin-dependent diabetes who presents with an episode of right-sided weakness and difficulty with speech.  This episode occurred on Saturday.  She describes feeling like the right arm and leg were weak and was having to use the walker all day which is atypical for her.  Her family member noticed that she was having difficulty speaking as well.  This lasted for about half a day and then resolved.  Her daughter notes that today she thought her speech seemed somewhat abnormal as well although patient denies noticing this.  She denies neurologic symptoms currently.  Had 1 similar episode several weeks ago and was referred to neurology by her PCP but has not yet followed up. ?  ? ?Past Medical History:  ?Diagnosis Date  ? CAD (coronary artery disease)   ? a. 04/2017 Cath: LM 80, LAD 80p, 37m LCX 95ost, 768mEF 45-50%; b. 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  ? Diastolic dysfunction   ? a. 04/2017 Echo: EF 55-60%, no rwma, Gr1 DD, mildly dil LA.  ? Hyperlipidemia   ? Hypertensive heart disease   ? Insulin dependent diabetes mellitus   ? ? ?Patient Active Problem List  ? Diagnosis Date Noted  ? Essential hypertension 01/18/2021  ? Claudication ([place]03/05/2021  ? Hypothyroidism 12/23/2020  ? Absolute anemia 06/17/2020  ? Weight loss 06/17/2020  ? Iron deficiency anemia due to chronic blood loss 06/17/2020  ? Abscess of left foot 06/12/2020  ? Pre-ulcerative calluses 04/03/2020  ? Coronary artery disease of native artery of native heart with stable angina pectoris (HCB[place]10/16/2018  ? PAD (peripheral artery disease) (HCFresno10/16/2018  ? Mixed hyperlipidemia 08/02/2017  ? Hx of CABG 08/01/2017   ? Type 2 diabetes mellitus without complication, with long-term current use of insulin (HCOnt[place]07/09/2017  ? Bruit 04/28/2017  ? Unstable angina (HCFlanders07/09/2017  ? Abnormal stress test 04/28/2017  ? Systolic dysfunction 0780/16/5537? Acanthosis nigricans 01/26/2012  ? Disease of hair and hair follicles 0448/27/0786? Hirsutism 01/26/2012  ? Local infection of skin and subcutaneous tissue 01/26/2012  ? ? ? ?Physical Exam  ?Triage Vital Signs: ?ED Triage Vitals  ?Enc Vitals Group  ?   BP 02/01/22 1518 (!) 150/77  ?   Pulse Rate 02/01/22 1518 63  ?   Resp 02/01/22 1518 18  ?   Temp 02/01/22 1518 97.7 ?F (36.5 ?C)  ?   Temp Source 02/01/22 1518 Oral  ?   SpO2 02/01/22 1518 95 %  ?   Weight 02/01/22 1519 161 lb (73 kg)  ?   Height 02/01/22 1519 '5\' 8"'$  (1.727 m)  ?   Head Circumference --   ?   Peak Flow --   ?   Pain Score 02/01/22 1519 0  ?   Pain Loc --   ?   Pain Edu? --   ?   Excl. in [place]--   ? ? ?Most recent vital signs: ?Vitals:  ? 02/01/22 1800 02/01/22 1915  ?BP: (!) 155/49 (!) 207/84  ?Pulse: (!) 51 62  ?Resp: 16 13  ?Temp:    ?SpO2: 100% 100%  ? ? ? ?General: Awake, no  distress.  ?CV:  Good peripheral perfusion.  ?Resp:  Normal effort.  ?Abd:  No distention.  ?Neuro:             Awake, Alert, Oriented x 3  ?Other:  Aox3, nml speech  ?PERRL, EOMI, face symmetric, nml tongue movement  ?5/5 strength in the BL upper and lower extremities  ?Sensation grossly intact in the BL upper and lower extremities  ?Finger-nose-finger intact BL ? ? ? ?ED Results / Procedures / Treatments  ?Labs ?(all labs ordered are listed, but only abnormal results are displayed) ?Labs Reviewed  ?COMPREHENSIVE METABOLIC PANEL - Abnormal; Notable for the following components:  ?    Result Value  ? Glucose, Bld 301 (*)   ? GFR, Estimated 57 (*)   ? All other components within normal limits  ?PROTIME-INR  ?APTT  ?CBC  ?DIFFERENTIAL  ?I-STAT CREATININE, ED  ?CBG MONITORING, ED  ? ? ? ?EKG ? ?EKG interpreted by myself, normal sinus rhythm with a  right bundle branch block, left axis deviation without ischemic change ? ? ?RADIOLOGY ?I reviewed the CT head which is negative for acute abnormality ? ? ?PROCEDURES: ? ?Critical Care performed: No ? ?Procedures ? ?The patient is on the cardiac monitor to evaluate for evidence of arrhythmia and/or significant heart rate changes. ? ? ?MEDICATIONS ORDERED IN ED: ?Medications  ?aspirin chewable tablet 324 mg (has no administration in time range)  ? ? ? ?IMPRESSION / MDM / ASSESSMENT AND PLAN / ED COURSE  ?I reviewed the triage vital signs and the nursing notes. ?             ?               ? ?Differential diagnosis includes, but is not limited to, TIA, arrhythmia, CVA ? ?Patient is a 81 year old female who presents after a self-limited episode of right-sided weakness and aphasia.  This occurred on Saturday, 2 days ago lasted for about half the day.  She reports decreased strength in the right upper and right lower extremity and family have commented on difficulty with speech.  Similar episode happened several weeks ago and she was supposed to see a neurologist but has not yet.  Currently she is asymptomatic although her daughter did note that her speech seemed somewhat slurred this morning.  Neurologic exam is within normal limits.  She is hypertensive but vitals are otherwise within normal limits.  EKG shows sinus rhythm with a right bundle.  Neurologic exam is nonfocal.  Given patient's multiple risk factors with unilateral weakness and aphasia she is at high risk for completing a stroke.  Will admit for TIA work-up. ? ?Patient called me to her room and now says that she would like to leave.  She is frustrated that she has not been able to eat and has been waiting a while for the MRI.  Had initially told me that she was willing to stay for admission.  Patient understood the risks of completing a stroke after having a TIA.  Ultimately she decided to leave [place].  We will give her neurology  follow-up. ? ?  ? ? ?FINAL CLINICAL IMPRESSION(S) / ED DIAGNOSES  ? ?Final diagnoses:  ?TIA (transient ischemic attack)  ? ? ? ?Rx / DC Orders  ? ?ED Discharge Orders   ? ? None  ? ?  ? ? ? ?Note:  This document was prepared using Dragon voice recognition software and may include unintentional dictation errors. ?  ?Starleen Blue,  Jennette Dubin, MD ?02/01/22 1931 ? ?  ?Rada Hay, MD ?02/01/22 2011 ? ?

## 2022-02-15 ENCOUNTER — Other Ambulatory Visit: Payer: Self-pay | Admitting: Physician Assistant

## 2022-02-15 DIAGNOSIS — R531 Weakness: Secondary | ICD-10-CM

## 2022-02-15 DIAGNOSIS — R262 Difficulty in walking, not elsewhere classified: Secondary | ICD-10-CM

## 2022-02-15 DIAGNOSIS — G459 Transient cerebral ischemic attack, unspecified: Secondary | ICD-10-CM

## 2022-02-24 ENCOUNTER — Ambulatory Visit
Admission: RE | Admit: 2022-02-24 | Discharge: 2022-02-24 | Disposition: A | Discharge status: Home / Self Care | Payer: Medicare Other | Source: Ambulatory Visit | Attending: Physician Assistant | Admitting: Physician Assistant

## 2022-02-24 DIAGNOSIS — R262 Difficulty in walking, not elsewhere classified: Secondary | ICD-10-CM | POA: Diagnosis present

## 2022-02-24 DIAGNOSIS — G459 Transient cerebral ischemic attack, unspecified: Secondary | ICD-10-CM | POA: Diagnosis not present

## 2022-02-24 DIAGNOSIS — R531 Weakness: Secondary | ICD-10-CM | POA: Diagnosis present

## 2022-02-24 IMAGING — MR MR HEAD W/O CM
12 series · 45 of 48 positions shown · non-contrast
Comparison: Prior head CT examinations [DATE] and earlier.

CLINICAL DATA: Provided history: TIA (transient ischemic attack).
Difficulty walking. Weakness. Additional history provided by
scanning technologist: Patient reports TIA 3 weeks ago.

EXAM:
MRI HEAD WITHOUT CONTRAST
TECHNIQUE: Multiplanar, multiecho pulse sequences of the brain and surrounding
structures were obtained without intravenous contrast.

[Series 5: ax dwi_tracew · axial · 3.0mm · 0.65mm/px · z∈[-81,+74]mm · 3 of 48 slices shown]
[im 1/48]
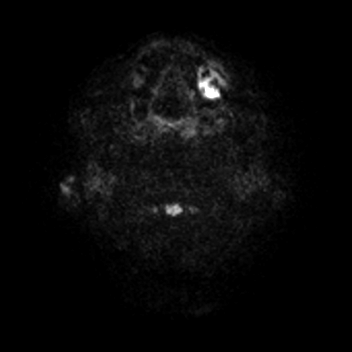
[im 24/48]
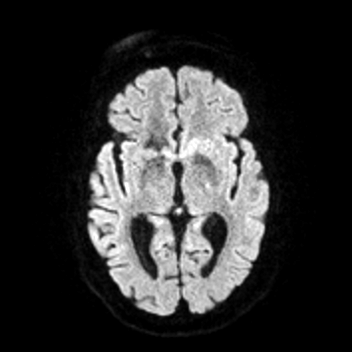
[im 48/48]
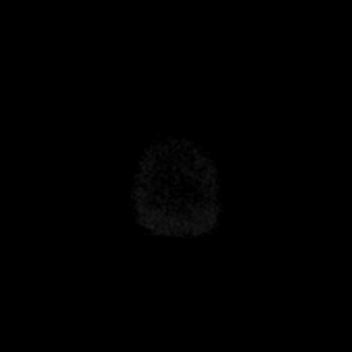

[Series 6: ax dwi_adc · axial · 3.0mm · 0.65mm/px · z∈[-81,+74]mm · 3 of 48 slices shown]
[im 1/48]
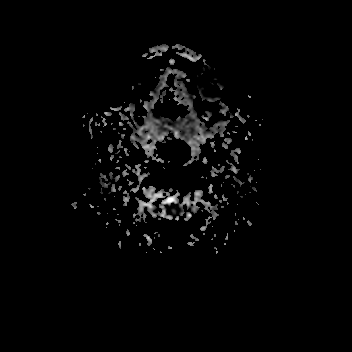
[im 24/48]
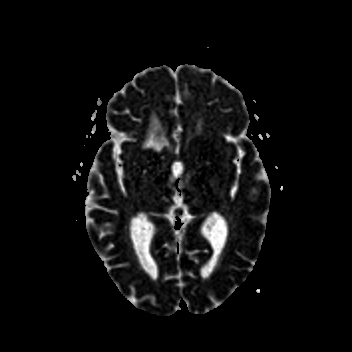
[im 48/48]
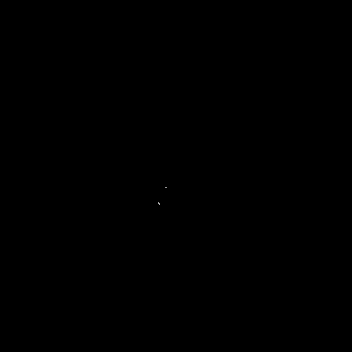

[Series 7: cor dwi_tracew · coronal · 5.0mm · 0.60mm/px · 3 of 38 slices shown]
[im 1/38]
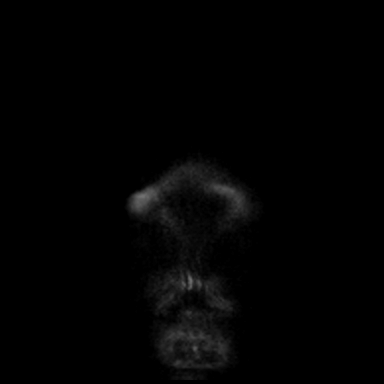
[im 19/38]
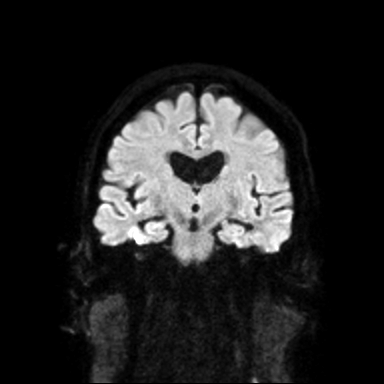
[im 38/38]
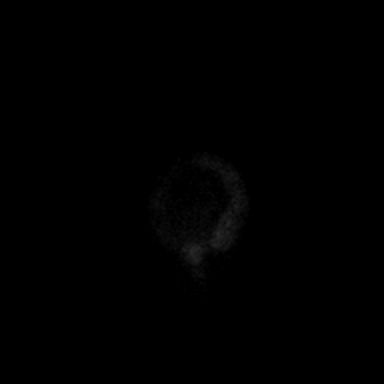

[Series 8: cor dwi_adc · coronal · 5.0mm · 0.60mm/px · 2 of 36 slices shown]
[im 1/36]
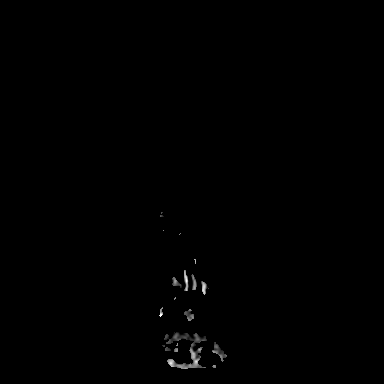
[im 36/36]
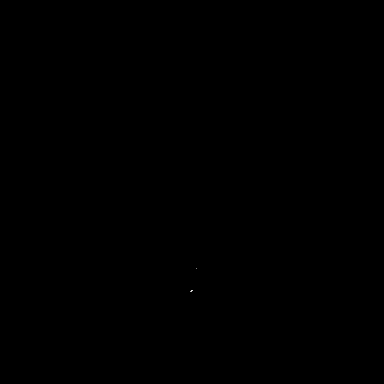

[Series 9: T1 · sagittal · 5.0mm · 0.62mm/px · 2 of 25 slices shown (1 of 2)]
[im 1/25]
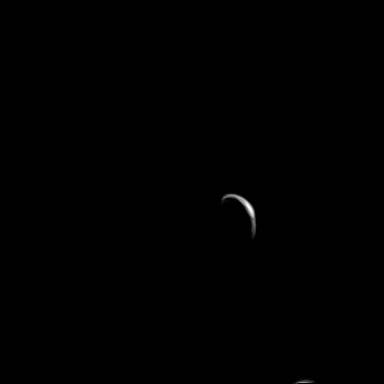
[im 25/25]
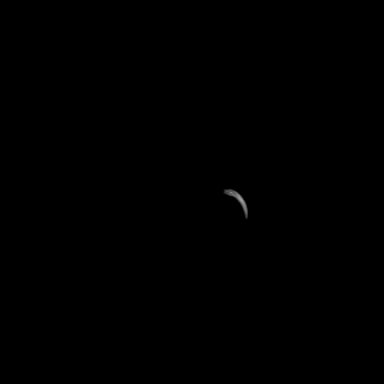

[Series 10: T2 · axial · 5.0mm · 0.53mm/px · z∈[-75,+68]mm · 2 of 25 slices shown (1 of 2)]
[im 1/25]
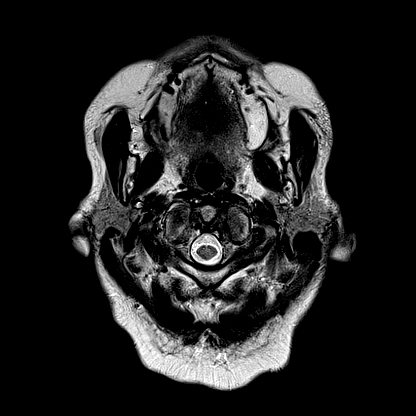
[im 25/25]
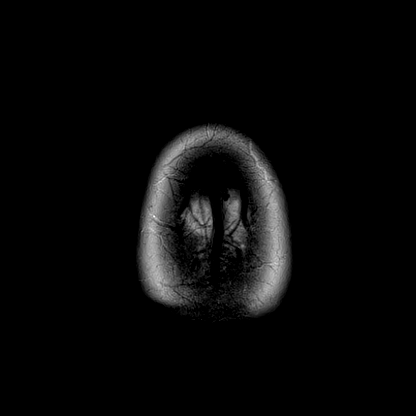

[Series 11: ax swi_mag · axial · 2.0mm · 0.90mm/px · z∈[-83,+75]mm · 5 of 80 slices shown]
[im 1/80]
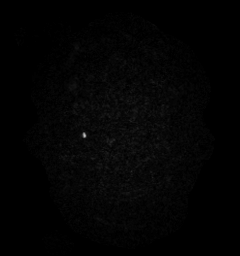
[im 20/80]
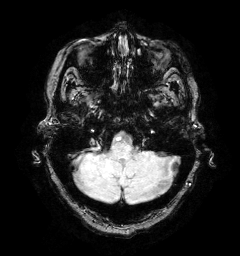
[im 40/80]
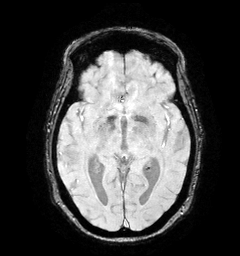
[im 60/80]
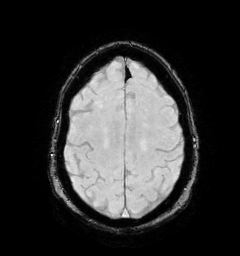
[im 80/80]
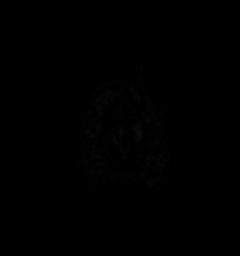

[Series 12: ax swi_pha · axial · 2.0mm · 0.90mm/px · z∈[-83,+75]mm · 5 of 80 slices shown]
[im 1/80]
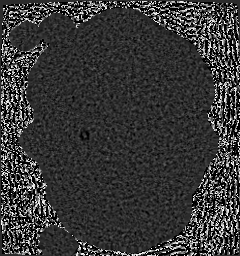
[im 20/80]
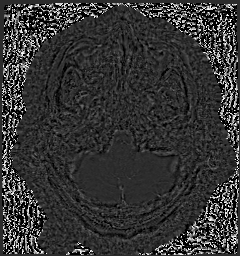
[im 40/80]
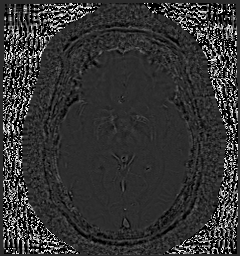
[im 60/80]
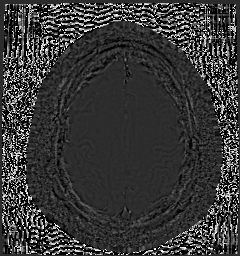
[im 80/80]
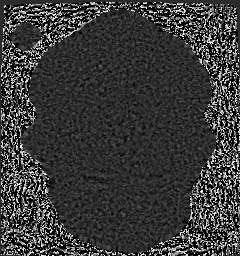

[Series 13: ax swi_swi · axial · 2.0mm · 0.90mm/px · z∈[-83,+75]mm · 5 of 80 slices shown]
[im 1/80]
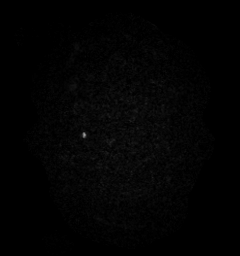
[im 20/80]
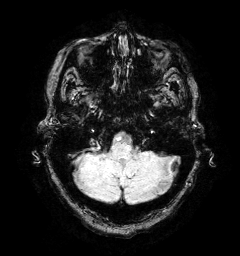
[im 40/80]
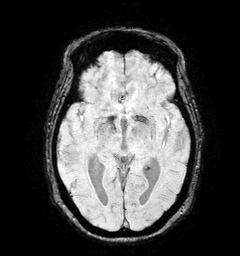
[im 60/80]
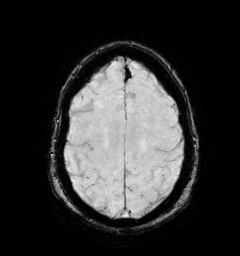
[im 80/80]
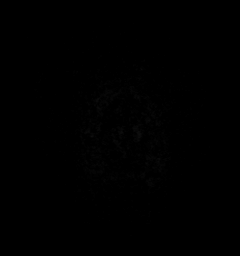

[Series 15: FLAIR · axial · 3.0mm · 0.53mm/px · z∈[-84,+77]mm · 4 of 55 slices shown]
[im 1/55]
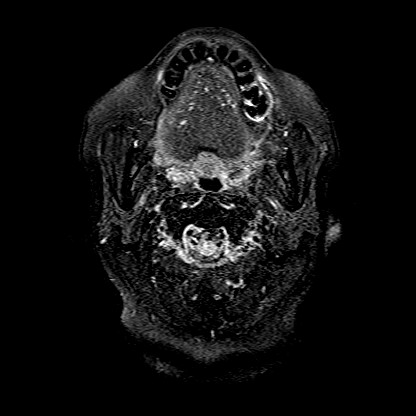
[im 19/55]
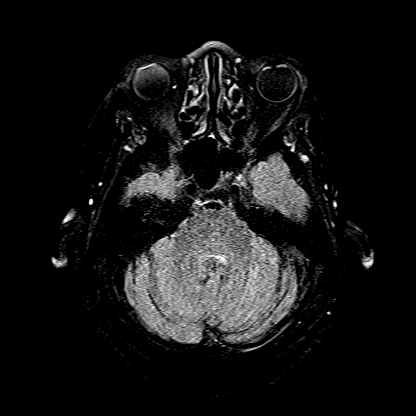
[im 37/55]
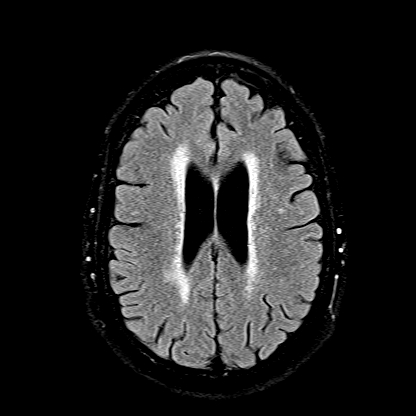
[im 55/55]
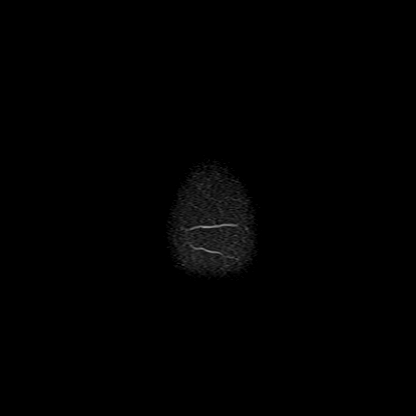

[Series 16: T1 · axial · 1.0mm · 0.98mm/px · z∈[-92,+83]mm · 9 of 176 slices shown (2 of 2)]
[im 1/176]
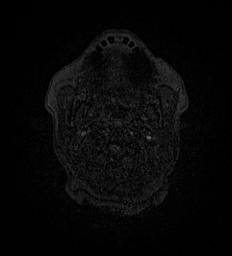
[im 16/176]
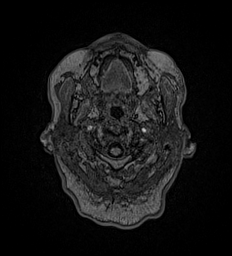
[im 32/176]
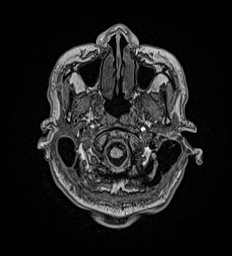
[im 48/176]
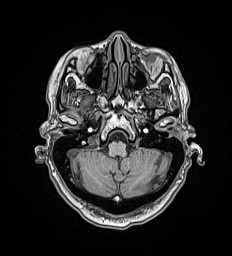
[im 80/176]
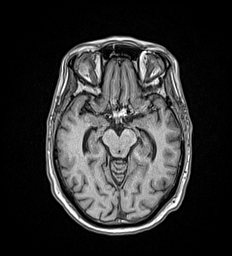
[im 96/176]
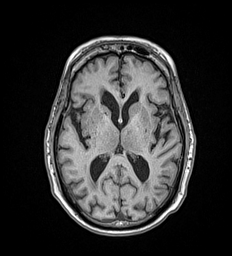
[im 128/176]
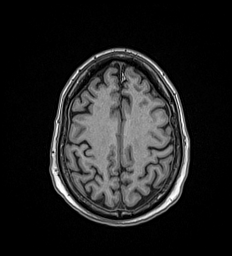
[im 144/176]
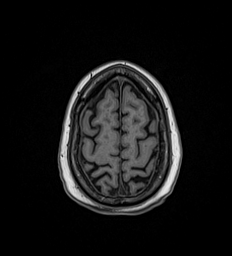
[im 176/176]
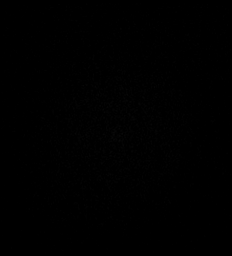

[Series 17: T2 · coronal · 5.0mm · 0.57mm/px · 2 of 29 slices shown (2 of 2)]
[im 1/29]
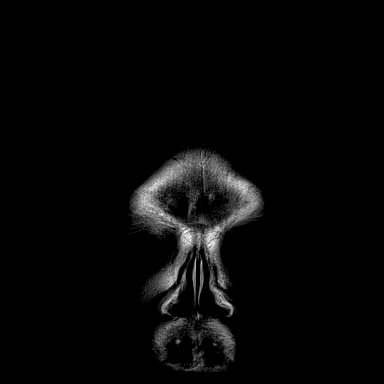
[im 29/29]
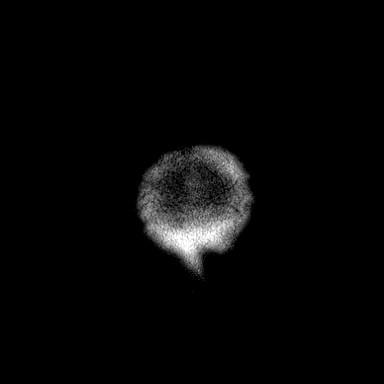

[45 of 48 positions shown; findings below may reference images not displayed]

FINDINGS: Brain:

No age-advanced or lobar predominant parenchymal atrophy.

Apparent punctate focus of mild diffusion-weighted signal
abnormality within the posterior limb of left internal capsule,
appreciated on the axial diffusion-weighted sequence only. This may
reflect a punctate subacute infarct or artifact.

Chronic lacunar infarcts within the right caudate nucleus, right
thalamus and left pons.

Moderate multifocal T2 FLAIR hyperintense signal abnormality within
the cerebral white matter, nonspecific but compatible with chronic
small vessel ischemic disease.

Punctate chronic microhemorrhages within the right frontal and
posterior right temporal lobes.

Subcentimeter chronic infarcts within the left cerebellar
hemisphere.

No evidence of an intracranial mass.

No extra-axial fluid collection.

No midline shift.

Vascular: Maintained flow voids within the proximal large arterial
vessels.

Skull and upper cervical spine: No focal suspicious marrow lesion.
Incompletely assessed cervical spondylosis.

Sinuses/Orbits: No mass or acute finding within the imaged orbits.
Prior bilateral ocular lens replacement. Mild mucosal thickening
within the bilateral ethmoid sinuses.

Other: Small-volume fluid within the left mastoid air cells.

Impression #1 will be called to the ordering clinician or
representative by the Radiologist Assistant, and communication
documented in the PACS or [REDACTED].
IMPRESSION: Punctate subacute infarct versus artifact within the posterior limb
of left internal capsule.

Chronic lacunar infarcts within the right caudate nucleus, right
thalamus and left pons.

Moderate chronic small vessel ischemic changes within the cerebral
white matter.

Subcentimeter chronic infarcts within the left cerebellar
hemisphere.

Mild mucosal thickening within the bilateral ethmoid sinuses.

Small-volume fluid within the left mastoid air cells.

## 2022-02-24 IMAGING — MR MR MRA HEAD W/O CM
1 series · 27 of 48 positions shown · non-contrast
Comparison: Same day MRA neck [DATE].

CLINICAL DATA: Provided history: Transient ischemic attack.
Difficulty walking. Weakness. Additional history provided by
scanning technologist: Patient reports TIA 3 weeks ago.

EXAM:
MRA HEAD WITHOUT CONTRAST
TECHNIQUE: Angiographic images of the Circle of Willis were acquired using MRA
technique without intravenous contrast.

[Series 9: TOF · axial · 0.5mm · 0.48mm/px · z∈[-68,+28]mm · 27 of 217 slices shown]
[im 1/217]
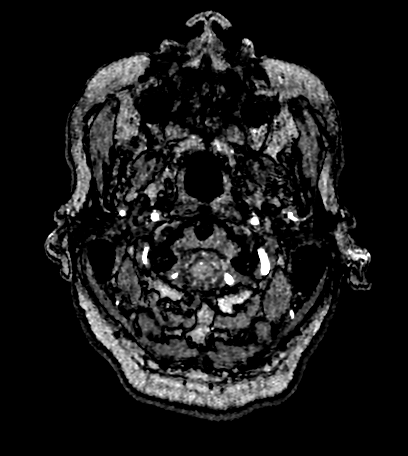
[im 5/217]
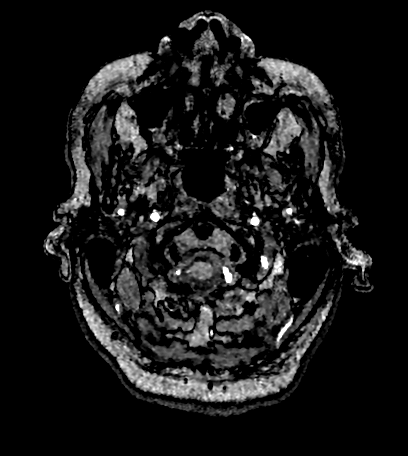
[im 10/217]
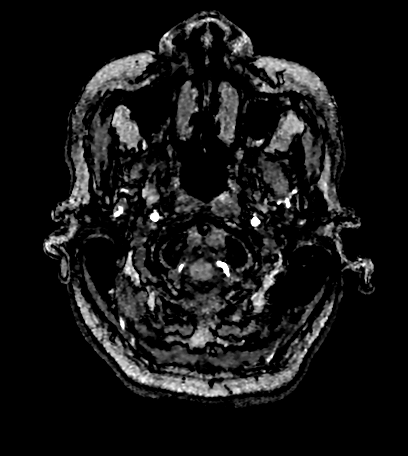
[im 14/217]
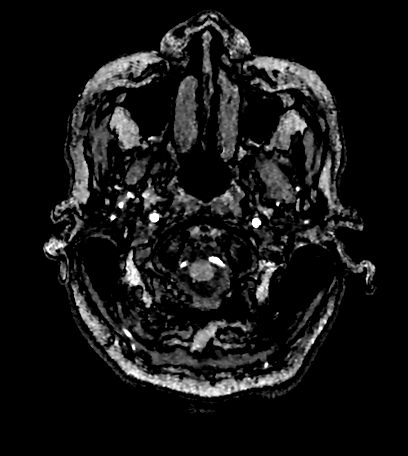
[im 19/217]
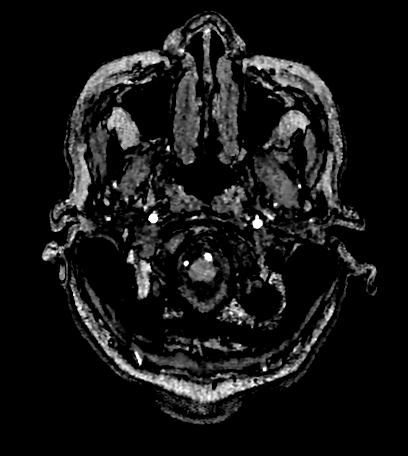
[im 23/217]
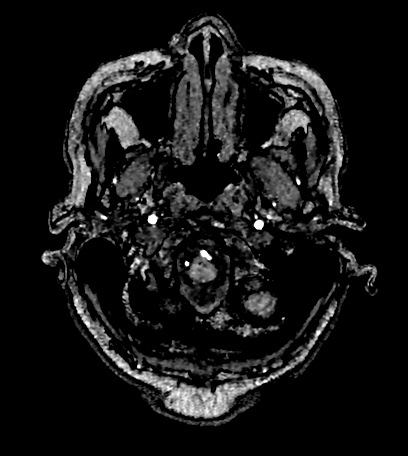
[im 28/217]
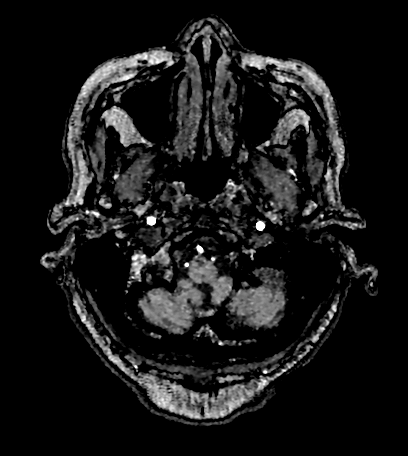
[im 33/217]
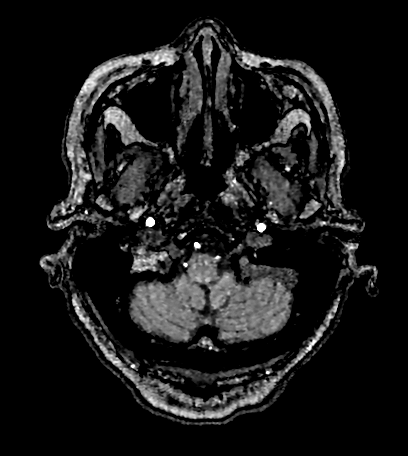
[im 37/217]
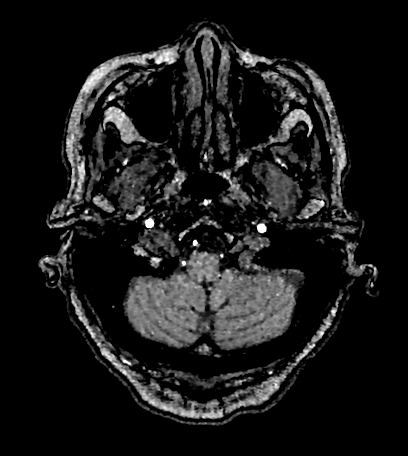
[im 42/217]
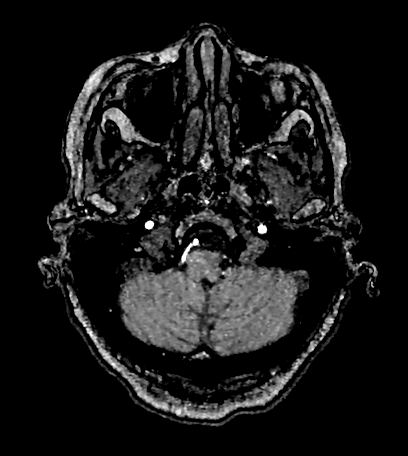
[im 46/217]
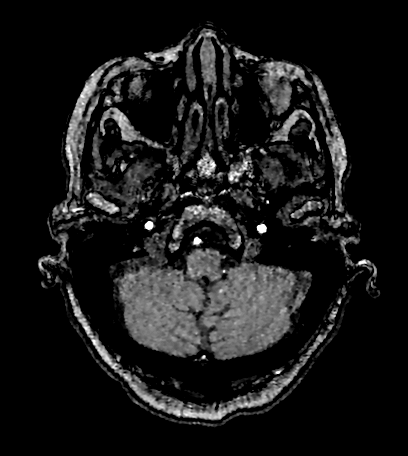
[im 51/217]
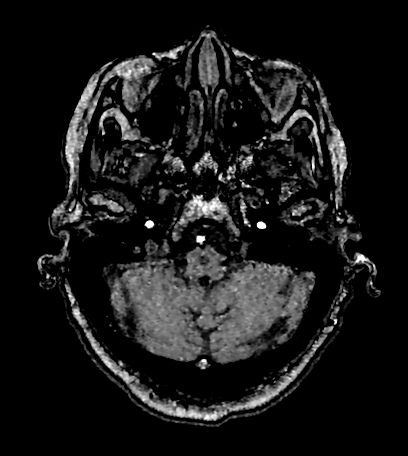
[im 56/217]
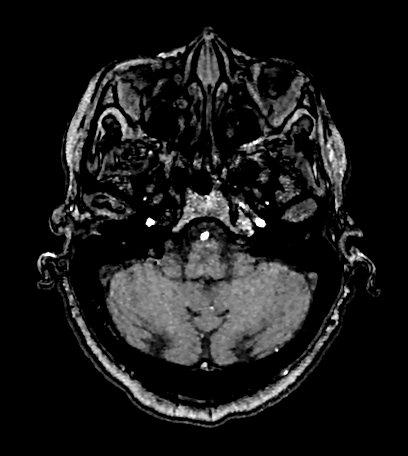
[im 60/217]
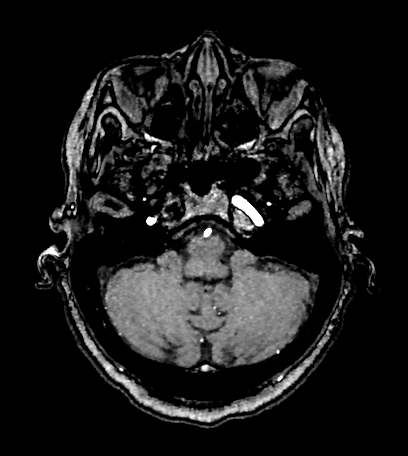
[im 65/217]
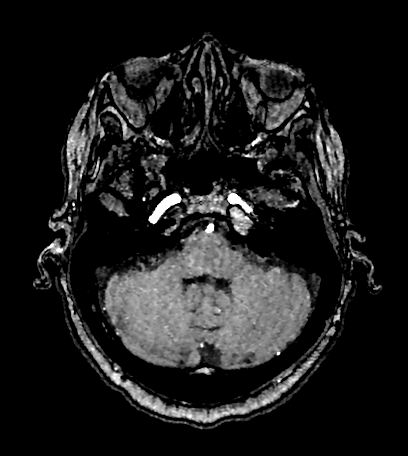
[im 69/217]
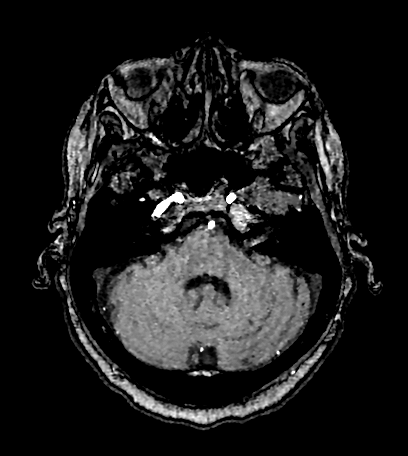
[im 74/217]
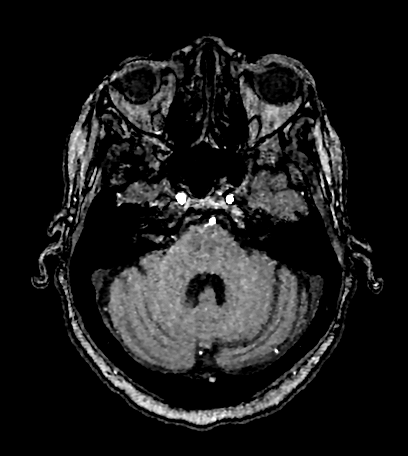
[im 79/217]
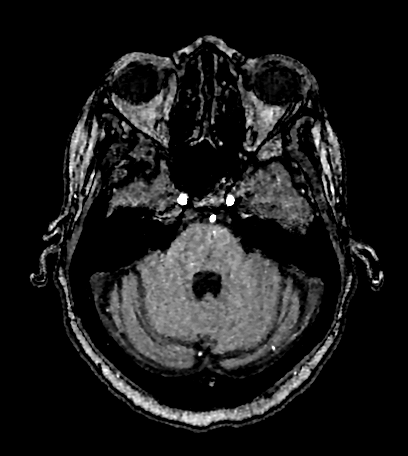
[im 83/217]
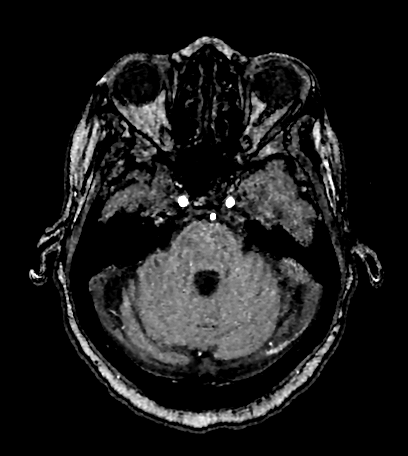
[im 88/217]
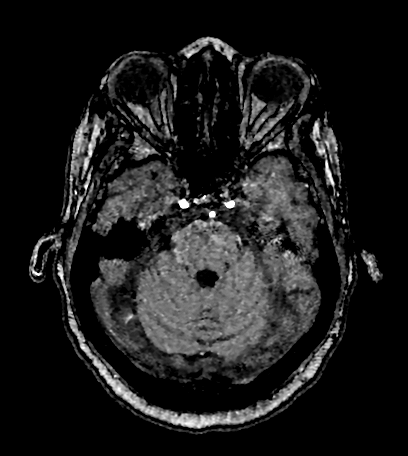
[im 97/217]
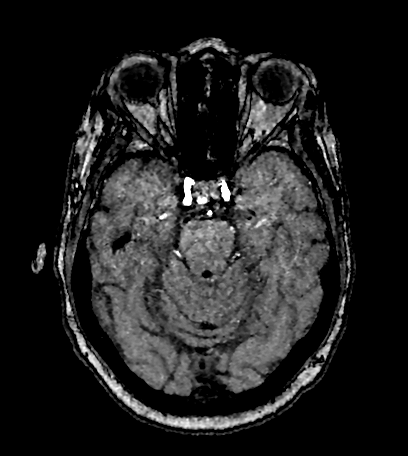
[im 111/217]
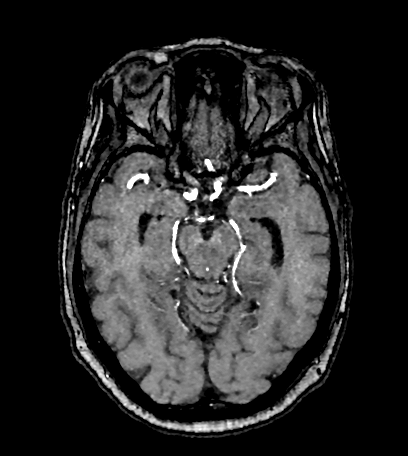
[im 125/217]
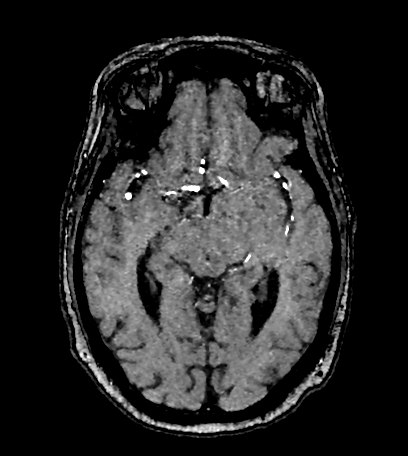
[im 152/217]
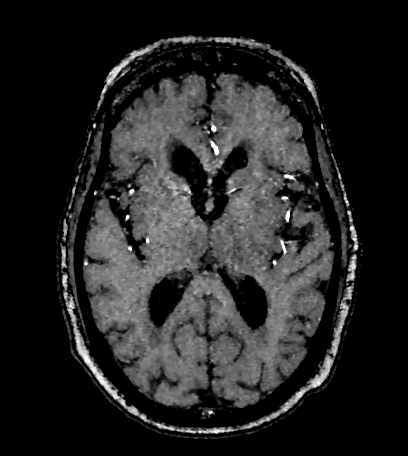
[im 180/217]
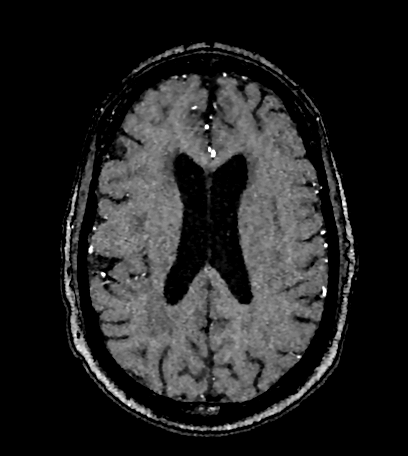
[im 184/217]
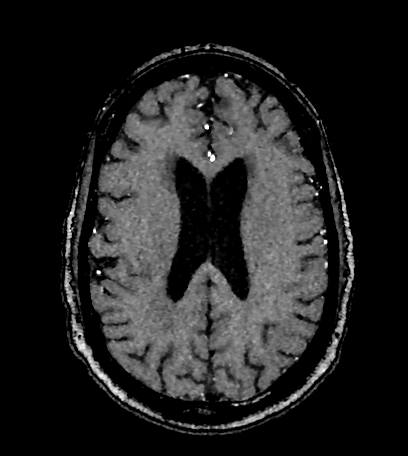
[im 207/217]
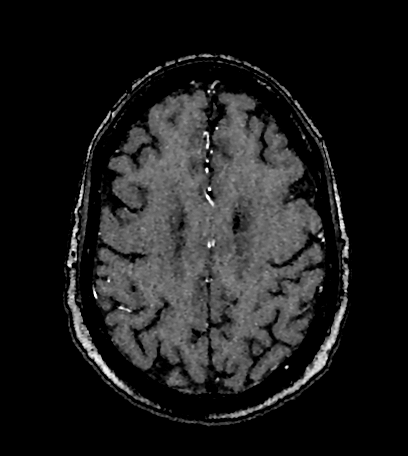

[27 of 48 positions shown; findings below may reference images not displayed]

FINDINGS: Anterior circulation:

The intracranial internal carotid arteries are patent.
Atherosclerotic irregularity of both vessels with sites of mild
stenosis. The M1 middle cerebral arteries are patent. Mild
atherosclerotic irregularity of both vessels without stenosis. No M2
proximal branch occlusion or high-grade proximal stenosis. The
anterior cerebral arteries are patent. Moderate focal stenosis
within the right A1 segment. No intracranial aneurysm is identified.

Posterior circulation:

The intracranial vertebral arteries are patent. The basilar artery
is patent. The posterior cerebral arteries are patent. Severe focal
stenosis within the proximal P2 right PCA. A left posterior
communicating artery is present. The right posterior communicating
artery is diminutive or absent.

Anatomic variants: As described.
IMPRESSION: No intracranial large vessel occlusion.

Intracranial atherosclerotic disease with multifocal stenoses, most
notably as follows.

Severe focal stenosis within the proximal P2 segment of the right
PCA.

Moderate focal stenosis within the A1 right anterior cerebral
artery.

## 2022-03-02 ENCOUNTER — Encounter: Payer: Self-pay | Admitting: Nurse Practitioner

## 2022-03-02 ENCOUNTER — Ambulatory Visit (INDEPENDENT_AMBULATORY_CARE_PROVIDER_SITE_OTHER): Payer: Medicare Other

## 2022-03-02 ENCOUNTER — Ambulatory Visit: Payer: Self-pay

## 2022-03-02 ENCOUNTER — Ambulatory Visit: Payer: Medicare Other | Admitting: Nurse Practitioner

## 2022-03-02 VITALS — BP 126/58 | HR 64 | Ht 68.0 in | Wt 154.1 lb

## 2022-03-02 DIAGNOSIS — I739 Peripheral vascular disease, unspecified: Secondary | ICD-10-CM

## 2022-03-02 DIAGNOSIS — R002 Palpitations: Secondary | ICD-10-CM

## 2022-03-02 DIAGNOSIS — E119 Type 2 diabetes mellitus without complications: Secondary | ICD-10-CM

## 2022-03-02 DIAGNOSIS — I10 Essential (primary) hypertension: Secondary | ICD-10-CM | POA: Diagnosis not present

## 2022-03-02 DIAGNOSIS — I2510 Atherosclerotic heart disease of native coronary artery without angina pectoris: Secondary | ICD-10-CM

## 2022-03-02 DIAGNOSIS — E785 Hyperlipidemia, unspecified: Secondary | ICD-10-CM

## 2022-03-02 DIAGNOSIS — I639 Cerebral infarction, unspecified: Secondary | ICD-10-CM

## 2022-03-02 DIAGNOSIS — Z794 Long term (current) use of insulin: Secondary | ICD-10-CM

## 2022-03-02 NOTE — Progress Notes (Signed)
? ? ?Office Visit  ?  ?Patient Name: Carrie Kane ?Date of Encounter: 03/02/2022 ? ?Primary Care Provider:  Cletis Athens, MD ?Primary Cardiologist:  Ida Rogue, MD ? ?Chief Complaint  ?  ?81 year old female with a history of CAD status post coronary artery bypass grafting in 2018, recent stroke, hypertension, hyperlipidemia, insulin-dependent diabetes, peripheral arterial disease, and diastolic dysfunction, who presents for follow-up related to recent stroke. ? ?Past Medical History  ?  ?Past Medical History:  ?Diagnosis Date  ? CAD (coronary artery disease)   ? a. 04/2017 Cath: LM 80, LAD 80p, 52m LCX 95ost, 716mEF 45-50%; b. 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  ? Diastolic dysfunction   ? a. 04/2017 Echo: EF 55-60%, no rwma, Gr1 DD, mildly dil LA; b. 08/2020 Echo: EF 55-60%, no rwma, GrII DD, nl RV fxn, RVSP 3067m, mod dil LA, mildly dil RA, Mod TR.  ? Hyperlipidemia   ? Hypertensive heart disease   ? Insulin dependent diabetes mellitus   ? PAD (peripheral artery disease) ([place] ? a. 02/2021 PTA/DCBA R Peroneal, R Popliteal, distal R SFA.  ? [facility] a. 01/2022 R sided wkns/aphasia/tremor/slurred speech-->Ss resolved; b. 02/2022 MRI/A: punctate subacute inf vs artifact-post limb of L int capsule. 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 Mod, chronic small vessel isch changes within the cerebral white matter.  Subcm chronic infarct- L cerebellar hemisphere.  Sev dzs prox P2 seg of R PCA. Mod dzs A1 R antClinical cytogeneticist? ?Past Surgical History:  ?Procedure Laterality Date  ? ABDOMINAL HYSTERECTOMY    ? CATARACT EXTRACTION W/PHACO Right 10/26/2021  ? Procedure: CATARACT EXTRACTION PHACO AND INTRAOCULAR LENS PLACEMENT (IOC) RIGHT DIABETIC 16.27 01:23.8;  Surgeon: KinEulogio BearD;  [facility] [facility];  Laterality: Right;  Please leave arrival at 8:00  ? CATARACT EXTRACTION W/PHACO Left 11/09/2021  ? Procedure: CATARACT  EXTRACTION PHACO AND INTRAOCULAR LENS PLACEMENT (IOCSpring HopeEFT DIABETIC;  Surgeon: KinEulogio BearD;  [place];  Laterality: Left;  13.39 ?01:13.0  ? CORONARY ARTERY BYPASS GRAFT N/A 05/09/2017  ? Procedure: CORONARY ARTERY BYPASS GRAFTING (CABG) x 3 using left internal mammary artery and right greater saphenous vein harvested endoscopically;  Surgeon: VanIvin PootD;  [facility] [place];  Laterality: N/A;  ? INTRAOPERATIVE TRANSESOPHAGEAL ECHOCARDIOGRAM N/A 05/09/2017  ? Procedure: INTRAOPERATIVE TRANSESOPHAGEAL ECHOCARDIOGRAM;  Surgeon: VanIvin PootD;  [facility] [place] [facility];  Laterality: N/A;  ? IR RADIOLOGIST EVAL & MGMT  12/04/2020  ? LEFT HEART CATH AND CORONARY ANGIOGRAPHY N/A 05/02/2017  ? Procedure: Left Heart Cath and Coronary Angiography;  Surgeon: AriWellington HampshireD;  [facility];  Service: Cardiovascular;  Laterality: N/A;  ? LOWER EXTREMITY ANGIOGRAPHY Left 02/09/2021  ? Procedure: LOWER EXTREMITY ANGIOGRAPHY;  Surgeon: DewAlgernon HuxleyD;  [place] [facility] [facility];  Service: Cardiovascular;  Laterality: Left;  ? LOWER EXTREMITY ANGIOGRAPHY Right 02/16/2021  ? Procedure: LOWER EXTREMITY ANGIOGRAPHY;  Surgeon: DewAlgernon HuxleyD;  [place];  Service: Cardiovascular;  Laterality: Right;  ? ? ?Allergies ? ?Allergies  ?Allergen Reactions  ? Shellfish Allergy Anaphylaxis  ?  Throat swelling  ? Flexeril [Cyclobenzaprine] Hypertension  ? ? ?History of Present Illness  ?  ?[age]ar old female with above past medical history including hypertension, hyperlipidemia, diabetes, CAD status post CABG, peripheral arterial disease status  post PTA, diastolic dysfunction, and recent stroke.  In mid 2018, she was evaluated with chest discomfort and underwent stress testing, which was abnormal.  Diagnostic catheterization in July 2018 showed severe multivessel disease, and she  was subsequently transferred to [facility] and underwent coronary artery bypass grafting x3.  EF was normal by echocardiogram at that time and she had a follow-up echo in November 2021 that showed an EF of 55 to 58%, grade 2 diastolic dysfunction, RVSP of 30 mmHg, moderate TR, and moderately dilated left atrium.  She is followed by vascular surgery in the setting of claudication and peripheral arterial disease and in May 2022, underwent PTA and drug-coated balloon angioplasty of the right peroneal, popliteal, and distal SFA.  She was last seen in cardiology clinic in September 2022, at which time she was doing well. ? ?In April of this year, Carrie Kane was evaluated in the emergency department after developing right-sided weakness, aphasia, tremor, slurred speech, and unsteady gait.  Symptoms lasted for much of the day and she had to use her walker to get around for the weekend.  She presented to the ED 2 days later, at which time she was asymptomatic, and CT showed chronic ischemic changes without acute intracranial process.  She left AMA and subsequently followed up with neurology.  Recent MRI showed punctate subacute infarct versus artifact-posterior limb of left internal capsule, with chronic lacunar infarcts in the right caudate nucleus, right thalamus, left pons.  Moderate chronic small vessel ischemic changes within the cerebral white matter, subcentimeter chronic infarct of the left cerebellar hemisphere, and severe proximal disease in the P2 segment of the right PCA with moderate disease in the A1 segment of the right anterior cerebral artery.  She has been chronically managed with aspirin and Plavix and was advised to follow-up with cardiology.  From a cardiovascular standpoint, Carrie Kane notes that she has been doing well.  She remains active without symptoms or limitations.  She has no residual symptoms related to her stroke.  She denies chest pain, dyspnea, palpitations, PND, orthopnea, dizziness,  syncope, edema, claudication, or early satiety. ? ?Home Medications  ?  ?Current Outpatient Medications  ?Medication Sig Dispense Refill  ? aspirin EC 81 MG tablet Take 1 tablet (81 mg total) by mouth daily. 90 tablet 3  ? atorvastatin (LIPITOR) 40 MG tablet Take 1 tablet (40 mg total) by mouth daily. 90 tablet 3  ? BD INSULIN SYRINGE U/F 30G X 1/2" 0.5 ML MISC     ? Chlorpheniramine Maleate (ALLERGY PO) Take 1 tablet by mouth daily as needed (allergies).    ? cholecalciferol (VITAMIN D) 1000 units tablet Take 2,000 Units by mouth daily at 12 noon.     ? cloNIDine (CATAPRES) 0.2 MG tablet TAKE 1 TABLET TWICE A DAY 90 tablet 2  ? clopidogrel (PLAVIX) 75 MG tablet Take 1 tablet (75 mg total) by mouth daily. 90 tablet 3  ? ferrous sulfate 325 (65 FE) MG EC tablet Take 1 tablet (325 mg total) by mouth daily at 2 PM. 90 tablet 3  ? HUMALOG KWIKPEN 100 UNIT/ML KwikPen INJECT 38 UNITS UNDER THE SKIN DAILY 45 mL 2  ? HUMALOG MIX 75/25 KWIKPEN (75-25) 100 UNIT/ML Kwikpen Inject 45 Units into the skin daily at 2 PM. 15 mL 3  ? insulin glargine (LANTUS) 100 UNIT/ML injection INJECT 45 UNITS UNDER THE SKIN AT BEDTIME 40 mL 3  ? levothyroxine (SYNTHROID) 100 MCG tablet Take 1 tablet (100 mcg total) by mouth  daily before breakfast. 90 tablet 3  ? Multiple Vitamin (MULTIVITAMIN) capsule Take 1 capsule by mouth daily at 12 noon.     ? nitroGLYCERIN (NITROSTAT) 0.4 MG SL tablet Place 1 tablet (0.4 mg total) under the tongue every 5 (five) minutes as needed for chest pain. 25 tablet 3  ? Polyethyl Glycol-Propyl Glycol (SYSTANE OP) Apply 1 drop to eye as needed (dry eyes).    ? valsartan (DIOVAN) 320 MG tablet Take 1 tablet (320 mg total) by mouth daily. 90 tablet 3  ? ?No current facility-administered medications for this visit.  ?  ? ?Review of Systems  ?  ?Recent right-sided weakness with aphasia, unsteady gait, and tremors, which have since resolved with strokes noted on MRI.  She denies chest pain, dyspnea, palpitations, PND,  orthopnea, dizziness, syncope, edema, early satiety, or claudication.  All other systems reviewed and are otherwise negative except as noted above. ?  ? ?Physical Exam  ?  ?VS:  BP (!) 126/58 (BP Location: Left

## 2022-03-02 NOTE — Patient Instructions (Addendum)
Medication Instructions:  ?No changes at this time. ? ?*If you need a refill on your cardiac medications before your next appointment, please call your pharmacy* ? ? ?Lab Work: ?None ? ?If you have labs (blood work) drawn today and your tests are completely normal, you will receive your results only by: ?MyChart Message (if you have MyChart) OR ?A paper copy in the mail ?If you have any lab test that is abnormal or we need to change your treatment, we will call you to review the results. ? ? ?Testing/Procedures: ?Your physician has requested that you have an echocardiogram. Echocardiography is a painless test that uses sound waves to create images of your heart. It provides your doctor with information about the size and shape of your heart and how well your heart?s chambers and valves are working. This procedure takes approximately one hour. There are no restrictions for this procedure. ? ?Your provider has ordered a heart monitor to wear for 28 days. This will be mailed to your home with instructions on placement. Once you have finished the time frame requested, you will return monitor in box provided. You will have 2 monitors shipped to you. Wear first monitor for 2 weeks then remove and apply another monitor for additional 2 weeks.  ? ? ? ? ? ?Follow-Up: ?At [facility], you and your health needs are our priority.  As part of our continuing mission to provide you with exceptional heart care, we have created designated Provider Care Teams.  These Care Teams include your primary Cardiologist (physician) and Advanced Practice Providers (APPs -  Physician Assistants and Nurse Practitioners) who all work together to provide you with the care you need, when you need it. ? ?We recommend signing up for the patient portal called "MyChart".  Sign up information is provided on this After Visit Summary.  MyChart is used to connect with patients for Virtual Visits (Telemedicine).  Patients are able to view lab/test  results, encounter notes, upcoming appointments, etc.  Non-urgent messages can be sent to your provider as well.   ?To learn more about what you can do with MyChart, go to [email or web address].   ? ?Your next appointment:   ?6 month(s) ? ?The format for your next appointment:   ?In Person ? ?Provider:   ?Ida Rogue, MD or Murray Hodgkins, NP  ? ? ? ? ?Important Information About Sugar ? ? ? ? ?  ?

## 2022-03-11 ENCOUNTER — Encounter: Payer: Self-pay | Admitting: Podiatry

## 2022-03-11 ENCOUNTER — Ambulatory Visit: Payer: Medicare Other | Admitting: Podiatry

## 2022-03-11 DIAGNOSIS — M79674 Pain in right toe(s): Secondary | ICD-10-CM | POA: Diagnosis not present

## 2022-03-11 DIAGNOSIS — L84 Corns and callosities: Secondary | ICD-10-CM

## 2022-03-11 DIAGNOSIS — M792 Neuralgia and neuritis, unspecified: Secondary | ICD-10-CM

## 2022-03-11 DIAGNOSIS — I739 Peripheral vascular disease, unspecified: Secondary | ICD-10-CM | POA: Diagnosis not present

## 2022-03-11 DIAGNOSIS — M79675 Pain in left toe(s): Secondary | ICD-10-CM

## 2022-03-11 DIAGNOSIS — B351 Tinea unguium: Secondary | ICD-10-CM

## 2022-03-11 NOTE — Progress Notes (Signed)
This patient returns to my office for at risk foot care.  This patient requires this care by a professional since this patient will be at risk due to having claudication, PAD and DM.  She has been seeing Dr.  Patel for ulcer left forefoot. He sends her back to me for routine foot care. This patient is unable to cut nails himself since the patient cannot reach his nails.These nails are painful walking and wearing shoes.  She has thickened callus on her left forefoot sub 2 left.   This patient presents for at risk foot care today.  General Appearance  Alert, conversant and in no acute stress.  Vascular  Dorsalis pedis and posterior tibial  pulses are weakly  palpable  bilaterally.  Capillary return is within normal limits  bilaterally. Temperature is within normal limits  bilaterally.  Neurologic  Senn-Weinstein monofilament wire test within normal limits  bilaterally. Muscle power within normal limits bilaterally.  Nails Thick disfigured discolored nails with subungual debris  from hallux to fifth toes bilaterally. No evidence of bacterial infection or drainage bilaterally.  Orthopedic  No limitations of motion  feet .  No crepitus or effusions noted.  No bony pathology or digital deformities noted.  Skin  normotropic skin with no porokeratosis noted bilaterally.  No signs of infections or ulcers noted.   Callus sub 2 left foot.  Onychomycosis  Pain in right toes  Pain in left toes  Callus left forefoot.  Consent was obtained for treatment procedures.   Mechanical debridement of nails 1-5  bilaterally performed with a nail nipper.  Filed with dremel without incident. Debride callus with # 15 blade followed by dremel tool usage.   Return office visit   9 weeks                  Told patient to return for periodic foot care and evaluation due to potential at risk complications.   Carrie Kane DPM   

## 2022-03-21 ENCOUNTER — Other Ambulatory Visit: Payer: Self-pay | Admitting: Internal Medicine

## 2022-03-22 ENCOUNTER — Other Ambulatory Visit: Payer: Self-pay

## 2022-03-22 ENCOUNTER — Other Ambulatory Visit: Payer: Self-pay | Admitting: Internal Medicine

## 2022-03-22 DIAGNOSIS — E039 Hypothyroidism, unspecified: Secondary | ICD-10-CM

## 2022-03-23 ENCOUNTER — Other Ambulatory Visit: Payer: Self-pay | Admitting: Nurse Practitioner

## 2022-03-23 DIAGNOSIS — I2510 Atherosclerotic heart disease of native coronary artery without angina pectoris: Secondary | ICD-10-CM

## 2022-03-23 DIAGNOSIS — I639 Cerebral infarction, unspecified: Secondary | ICD-10-CM

## 2022-04-02 ENCOUNTER — Ambulatory Visit (INDEPENDENT_AMBULATORY_CARE_PROVIDER_SITE_OTHER): Payer: Medicare Other

## 2022-04-02 DIAGNOSIS — I639 Cerebral infarction, unspecified: Secondary | ICD-10-CM

## 2022-04-02 DIAGNOSIS — I2510 Atherosclerotic heart disease of native coronary artery without angina pectoris: Secondary | ICD-10-CM | POA: Diagnosis not present

## 2022-04-02 LAB — ECHOCARDIOGRAM COMPLETE
AR max vel: 2.05 cm2
AV Area VTI: 2.29 cm2
AV Area mean vel: 1.89 cm2
AV Mean grad: 4 mmHg
AV Peak grad: 7.7 mmHg
Ao pk vel: 1.39 m/s
Area-P 1/2: 2.05 cm2
Calc EF: 65.6 %
MV VTI: 1.64 cm2
Single Plane A2C EF: 64.7 %
Single Plane A4C EF: 66 %

## 2022-04-06 ENCOUNTER — Telehealth: Payer: Self-pay | Admitting: *Deleted

## 2022-04-06 DIAGNOSIS — I77819 Aortic ectasia, unspecified site: Secondary | ICD-10-CM

## 2022-04-06 NOTE — Telephone Encounter (Signed)
Left voicemail message to call back for review of results.  

## 2022-04-06 NOTE — Telephone Encounter (Signed)
-----   Message from Theora Gianotti, NP sent at 04/03/2022  7:37 AM EDT ----- Normal heart squeezing function.  The heart muscle is mildly stiff.  The mitral valve is mildly leaky.  The pressure on the right side of the heart is mildly elevated and this is associated with mild leakiness of the tricuspid valve.  The Aorta is mildly dilated.  Overall, findings are similar to what was seen in 08/2020.  We will plan to follow up mild dilation of the aorta with a repeat echo in 1 year.

## 2022-04-06 NOTE — Telephone Encounter (Signed)
Reviewed results and recommendations with patient and she verbalized understanding with no further questions at this time. Order placed for repeat echo in one year.

## 2022-05-05 ENCOUNTER — Other Ambulatory Visit: Payer: Self-pay | Admitting: Cardiovascular Disease

## 2022-05-10 ENCOUNTER — Other Ambulatory Visit: Payer: Self-pay

## 2022-05-10 ENCOUNTER — Emergency Department: Payer: Medicare Other

## 2022-05-10 ENCOUNTER — Inpatient Hospital Stay
Admission: EM | Admit: 2022-05-10 | Discharge: 2022-05-14 | DRG: 522 | Disposition: A | Discharge status: Skilled Nursing Facility | Payer: Medicare Other | Attending: Internal Medicine | Admitting: Internal Medicine

## 2022-05-10 DIAGNOSIS — Z7902 Long term (current) use of antithrombotics/antiplatelets: Secondary | ICD-10-CM

## 2022-05-10 DIAGNOSIS — E039 Hypothyroidism, unspecified: Secondary | ICD-10-CM | POA: Diagnosis present

## 2022-05-10 DIAGNOSIS — Z01818 Encounter for other preprocedural examination: Secondary | ICD-10-CM

## 2022-05-10 DIAGNOSIS — I10 Essential (primary) hypertension: Secondary | ICD-10-CM | POA: Diagnosis present

## 2022-05-10 DIAGNOSIS — S72002A Fracture of unspecified part of neck of left femur, initial encounter for closed fracture: Principal | ICD-10-CM

## 2022-05-10 DIAGNOSIS — Z888 Allergy status to other drugs, medicaments and biological substances status: Secondary | ICD-10-CM | POA: Diagnosis not present

## 2022-05-10 DIAGNOSIS — Z79899 Other long term (current) drug therapy: Secondary | ICD-10-CM | POA: Diagnosis not present

## 2022-05-10 DIAGNOSIS — Z01810 Encounter for preprocedural cardiovascular examination: Secondary | ICD-10-CM | POA: Diagnosis not present

## 2022-05-10 DIAGNOSIS — E785 Hyperlipidemia, unspecified: Secondary | ICD-10-CM | POA: Diagnosis present

## 2022-05-10 DIAGNOSIS — I739 Peripheral vascular disease, unspecified: Secondary | ICD-10-CM | POA: Diagnosis present

## 2022-05-10 DIAGNOSIS — Z91013 Allergy to seafood: Secondary | ICD-10-CM

## 2022-05-10 DIAGNOSIS — Y9301 Activity, walking, marching and hiking: Secondary | ICD-10-CM | POA: Diagnosis present

## 2022-05-10 DIAGNOSIS — E1165 Type 2 diabetes mellitus with hyperglycemia: Secondary | ICD-10-CM | POA: Diagnosis present

## 2022-05-10 DIAGNOSIS — I2510 Atherosclerotic heart disease of native coronary artery without angina pectoris: Secondary | ICD-10-CM | POA: Diagnosis present

## 2022-05-10 DIAGNOSIS — I452 Bifascicular block: Secondary | ICD-10-CM | POA: Diagnosis present

## 2022-05-10 DIAGNOSIS — Z951 Presence of aortocoronary bypass graft: Secondary | ICD-10-CM

## 2022-05-10 DIAGNOSIS — I25119 Atherosclerotic heart disease of native coronary artery with unspecified angina pectoris: Secondary | ICD-10-CM

## 2022-05-10 DIAGNOSIS — Z7982 Long term (current) use of aspirin: Secondary | ICD-10-CM | POA: Diagnosis not present

## 2022-05-10 DIAGNOSIS — E11649 Type 2 diabetes mellitus with hypoglycemia without coma: Secondary | ICD-10-CM

## 2022-05-10 DIAGNOSIS — D62 Acute posthemorrhagic anemia: Secondary | ICD-10-CM | POA: Diagnosis not present

## 2022-05-10 DIAGNOSIS — E440 Moderate protein-calorie malnutrition: Secondary | ICD-10-CM | POA: Diagnosis present

## 2022-05-10 DIAGNOSIS — Z8673 Personal history of transient ischemic attack (TIA), and cerebral infarction without residual deficits: Secondary | ICD-10-CM | POA: Diagnosis not present

## 2022-05-10 DIAGNOSIS — Y92009 Unspecified place in unspecified non-institutional (private) residence as the place of occurrence of the external cause: Secondary | ICD-10-CM | POA: Diagnosis not present

## 2022-05-10 DIAGNOSIS — Z6823 Body mass index (BMI) 23.0-23.9, adult: Secondary | ICD-10-CM

## 2022-05-10 DIAGNOSIS — Z794 Long term (current) use of insulin: Secondary | ICD-10-CM

## 2022-05-10 DIAGNOSIS — W1830XA Fall on same level, unspecified, initial encounter: Secondary | ICD-10-CM | POA: Diagnosis present

## 2022-05-10 DIAGNOSIS — I25118 Atherosclerotic heart disease of native coronary artery with other forms of angina pectoris: Secondary | ICD-10-CM | POA: Diagnosis present

## 2022-05-10 DIAGNOSIS — W19XXXA Unspecified fall, initial encounter: Secondary | ICD-10-CM

## 2022-05-10 DIAGNOSIS — Z79890 Hormone replacement therapy (postmenopausal): Secondary | ICD-10-CM

## 2022-05-10 DIAGNOSIS — D649 Anemia, unspecified: Secondary | ICD-10-CM

## 2022-05-10 DIAGNOSIS — E1151 Type 2 diabetes mellitus with diabetic peripheral angiopathy without gangrene: Secondary | ICD-10-CM | POA: Diagnosis present

## 2022-05-10 HISTORY — DX: Supraventricular tachycardia, unspecified: I47.10

## 2022-05-10 HISTORY — DX: Supraventricular tachycardia: I47.1

## 2022-05-10 LAB — CBC
HCT: 31.2 % — ABNORMAL LOW (ref 36.0–46.0)
Hemoglobin: 10.1 g/dL — ABNORMAL LOW (ref 12.0–15.0)
MCH: 29.1 pg (ref 26.0–34.0)
MCHC: 32.4 g/dL (ref 30.0–36.0)
MCV: 89.9 fL (ref 80.0–100.0)
Platelets: 207 10*3/uL (ref 150–400)
RBC: 3.47 MIL/uL — ABNORMAL LOW (ref 3.87–5.11)
RDW: 13.8 % (ref 11.5–15.5)
WBC: 4.8 10*3/uL (ref 4.0–10.5)
nRBC: 0 % (ref 0.0–0.2)

## 2022-05-10 LAB — TYPE AND SCREEN

## 2022-05-10 LAB — COMPREHENSIVE METABOLIC PANEL
ALT: 18 U/L (ref 0–44)
AST: 28 U/L (ref 15–41)
Albumin: 3.8 g/dL (ref 3.5–5.0)
Alkaline Phosphatase: 53 U/L (ref 38–126)
Anion gap: 8 (ref 5–15)
BUN: 16 mg/dL (ref 8–23)
CO2: 26 mmol/L (ref 22–32)
Calcium: 9.6 mg/dL (ref 8.9–10.3)
Chloride: 102 mmol/L (ref 98–111)
Creatinine, Ser: 0.78 mg/dL (ref 0.44–1.00)
GFR, Estimated: >60 mL/min (ref >60)
Glucose, Bld: 128 mg/dL — ABNORMAL HIGH (ref 70–99)
Potassium: 3.6 mmol/L (ref 3.5–5.1)
Sodium: 136 mmol/L (ref 135–145)
Total Bilirubin: 0.7 mg/dL (ref 0.3–1.2)
Total Protein: 6.4 g/dL — ABNORMAL LOW (ref 6.5–8.1)

## 2022-05-10 LAB — APTT: aPTT: 29 seconds (ref 24–36)

## 2022-05-10 LAB — PROTIME-INR
INR: 1.1 (ref 0.8–1.2)
Prothrombin Time: 14 seconds (ref 11.4–15.2)

## 2022-05-10 LAB — HM DIABETES EYE EXAM

## 2022-05-10 NOTE — ED Notes (Signed)
Recollect on type and screen sent

## 2022-05-10 NOTE — Assessment & Plan Note (Signed)
Levothyroxine to resume postoperatively

## 2022-05-10 NOTE — ED Provider Notes (Incomplete)
[facility] Provider Note   Event Date/Time   First MD Initiated Contact with Patient 05/10/22 2338     (approximate) History  Fall  HPI Carrie Kane is a 81 y.o. female  ROS: Patient currently denies any vision changes, tinnitus, difficulty speaking, facial droop, sore throat, chest pain, shortness of breath, abdominal pain, nausea/vomiting/diarrhea, dysuria, or weakness/numbness/paresthesias in any extremity   Physical Exam  Triage Vital Signs: ED Triage Vitals [05/10/22 2141]  Enc Vitals Group     BP (!) 174/66     Pulse Rate 70     Resp 14     Temp 98.3 F (36.8 C)     Temp Source Oral     SpO2 100 %     Weight 154 lb (69.9 kg)     Height '5\' 8"'$  (1.727 m)     Head Circumference      Peak Flow      Pain Score 8     Pain Loc      Pain Edu?      Excl. in [place]?    Most recent vital signs: Vitals:   05/10/22 2300 05/10/22 2305  BP: (!) 183/77 (!) 196/64  Pulse: 76 73  Resp: 13 15  Temp:    SpO2: 100% 100%   General: Awake, oriented x4.*** CV:  Good peripheral perfusion. *** Resp:  Normal effort. *** Abd:  No distention. *** Other:  *** ED Results / Procedures / Treatments  Labs (all labs ordered are listed, but only abnormal results are displayed) Labs Reviewed  CBC - Abnormal; Notable for the following components:      Result Value   RBC 3.47 (*)    Hemoglobin 10.1 (*)    HCT 31.2 (*)    All other components within normal limits  COMPREHENSIVE METABOLIC PANEL - Abnormal; Notable for the following components:   Glucose, Bld 128 (*)    Total Protein 6.4 (*)    All other components within normal limits  APTT  PROTIME-INR  TYPE AND SCREEN  TYPE AND SCREEN  TYPE AND SCREEN   EKG ED ECG REPORT I, Carrie Kane, the attending physician, personally viewed and interpreted this ECG. Date: 05/10/2022 EKG Time: *** Rate: *** Rhythm: normal sinus rhythm QRS Axis: normal Intervals: normal ST/T Wave abnormalities: normal Narrative  Interpretation: no evidence of acute ischemia RADIOLOGY ED MD interpretation:  *** -Agree with radiology assessment Official radiology report(s): DG HIP UNILAT WITH PELVIS 2-3 VIEWS LEFT  Result Date: 05/10/2022 CLINICAL DATA:  [id number].  Fall bilateral hip pain. EXAM: DG HIP (WITH OR WITHOUT PELVIS) 2-3V LEFT; DG HIP (WITH OR WITHOUT PELVIS) 2-3V RIGHT COMPARISON:  None Available. FINDINGS: Acute superiorly displaced left femoral neck fracture. No left hip dislocation. There is no evidence of hip fracture or dislocation right hip. No acute displaced fracture or diastasis of the bones of the pelvis. There is no evidence of severe arthropathy or other focal bone abnormality. Vascular calcifications. IMPRESSION: 1. Acute superiorly displaced left femoral neck fracture. 2. No acute displaced fracture or dislocation of the right hip. 3. No acute displaced fracture or diastasis of the bones of the pelvis. Electronically Signed   By: Iven Finn M.D.   On: 05/10/2022 22:17   DG HIP UNILAT WITH PELVIS 2-3 VIEWS RIGHT  Result Date: 05/10/2022 CLINICAL DATA:  [id number].  Fall bilateral hip pain. EXAM: DG HIP (WITH OR WITHOUT PELVIS) 2-3V LEFT; DG HIP (WITH OR WITHOUT PELVIS) 2-3V RIGHT COMPARISON:  None Available. FINDINGS: Acute superiorly displaced left femoral neck fracture. No left hip dislocation. There is no evidence of hip fracture or dislocation right hip. No acute displaced fracture or diastasis of the bones of the pelvis. There is no evidence of severe arthropathy or other focal bone abnormality. Vascular calcifications. IMPRESSION: 1. Acute superiorly displaced left femoral neck fracture. 2. No acute displaced fracture or dislocation of the right hip. 3. No acute displaced fracture or diastasis of the bones of the pelvis. Electronically Signed   By: Iven Finn M.D.   On: 05/10/2022 22:17   DG Chest 1 View  Result Date: 05/10/2022 CLINICAL DATA:  Bilateral hip pain after a fall. EXAM: CHEST  1  VIEW COMPARISON:  06/15/2017 FINDINGS: Postoperative changes in the mediastinum. Normal heart size and pulmonary vascularity. No focal airspace disease or consolidation in the lungs. No blunting of costophrenic angles. No pneumothorax. Mediastinal contours appear intact. IMPRESSION: No active disease. Electronically Signed   By: Lucienne Capers M.D.   On: 05/10/2022 22:15   PROCEDURES: Critical Care performed: {CriticalCareYesNo:19197::"Yes, see critical care procedure note(s)","No"} Procedures MEDICATIONS ORDERED IN ED: Medications - No data to display IMPRESSION / MDM / [place] / ED COURSE  I reviewed the triage vital signs and the nursing notes.                             Differential diagnosis includes, but is not limited to, *** {**The patient is on the cardiac monitor to evaluate for evidence of arrhythmia and/or significant heart rate changes.**} Patient's presentation is most consistent with {EM COPA:27473} {Remember to include, when applicable, any/all of the following data: independent review of imaging independent review of labs (comment specifically on pertinent positives and negatives) review of specific prior hospitalizations, PCP/specialist notes, etc. discuss meds given and prescribed document any discussion with consultants (including hospitalists) any clinical decision tools you used and why (PECARN, NEXUS, etc.) did you consider admitting the patient? document social determinants of health affecting patient's care (homelessness, inability to follow up in a timely fashion, etc) document any pre-existing conditions increasing risk on current visit (e.g. diabetes and HTN increasing danger of high-risk chest pain/ACS) describes what meds you gave (especially parenteral) and why any other interventions?:1}   FINAL CLINICAL IMPRESSION(S) / ED DIAGNOSES   Final diagnoses:  None   Rx / DC Orders   ED Discharge Orders     None      Note:  This document  was prepared using Dragon voice recognition software and may include unintentional dictation errors.

## 2022-05-10 NOTE — Assessment & Plan Note (Signed)
Orthopedist consulted from the ED and will perform surgery in the a.m. Will hold Plavix Pain control N.p.o. from midnight

## 2022-05-10 NOTE — ED Notes (Signed)
pt to xray at this time

## 2022-05-10 NOTE — ED Notes (Addendum)
Pt placed on purewick at this time and boosted in the bed

## 2022-05-10 NOTE — Assessment & Plan Note (Signed)
Sliding scale insulin coverage 

## 2022-05-10 NOTE — Assessment & Plan Note (Signed)
No complaints of chest pain and EKG nonacute.  We will get a baseline troponin Can resume aspirin, atorvastatin postoperatively Nitroglycerin sublingual as needed chest pain

## 2022-05-10 NOTE — ED Triage Notes (Signed)
Patient coming form home when called out for a fall from standing position, pt recalls falling hard on left hip, that leg does appear shorter. Is on plavix, pt c/o 8/10 hip pain, unable to bear weight, had feeling in leg and toes. Pt did not hit head according to by standers. Pt A&Ox4

## 2022-05-10 NOTE — Assessment & Plan Note (Signed)
Hemoglobin 10, down from 12 a couple months prior No evidence of acute bleeding

## 2022-05-10 NOTE — Assessment & Plan Note (Signed)
BP elevated likely related to pain Pain controlled Hydralazine IV as needed while n.p.o.

## 2022-05-10 NOTE — ED Provider Notes (Addendum)
[facility] Provider Note   Event Date/Time   First MD Initiated Contact with Patient 05/10/22 2338     (approximate) History  Fall  HPI Carrie Kane is a 81 y.o. female who presents after mechanical fall from standing onto her left side and complaining of significant bilateral hip pain that is worse on the left.  EMS does note shortening in this extremity.  Patient denies any head trauma or loss of consciousness ROS: Patient currently denies any vision changes, tinnitus, difficulty speaking, facial droop, sore throat, chest pain, shortness of breath, abdominal pain, nausea/vomiting/diarrhea, dysuria, or weakness/numbness/paresthesias in any extremity   Physical Exam  Triage Vital Signs: ED Triage Vitals [05/10/22 2141]  Enc Vitals Group     BP (!) 174/66     Pulse Rate 70     Resp 14     Temp 98.3 F (36.8 C)     Temp Source Oral     SpO2 100 %     Weight 154 lb (69.9 kg)     Height '5\' 8"'$  (1.727 m)     Head Circumference      Peak Flow      Pain Score 8     Pain Loc      Pain Edu?      Excl. in [place]?    Most recent vital signs: Vitals:   05/10/22 2300 05/10/22 2305  BP: (!) 183/77 (!) 196/64  Pulse: 76 73  Resp: 13 15  Temp:    SpO2: 100% 100%   General: Awake, oriented x4. CV:  Good peripheral perfusion.  Resp:  Normal effort.  Abd:  No distention.  Other:  Elderly African-American female laying in bed in mild distress secondary to pain with shortening of the left lower extremity and tenderness to palpation over the left anterior hip ED Results / Procedures / Treatments  Labs (all labs ordered are listed, but only abnormal results are displayed) Labs Reviewed  CBC - Abnormal; Notable for the following components:      Result Value   RBC 3.47 (*)    Hemoglobin 10.1 (*)    HCT 31.2 (*)    All other components within normal limits  COMPREHENSIVE METABOLIC PANEL - Abnormal; Notable for the following components:   Glucose, Bld 128 (*)     Total Protein 6.4 (*)    All other components within normal limits  APTT  PROTIME-INR  HEMOGLOBIN A1C  TYPE AND SCREEN  TYPE AND SCREEN  TYPE AND SCREEN   EKG ED ECG REPORT I, [name], the attending physician, personally viewed and interpreted this ECG. Date: 05/10/2022 EKG Time: 2137 Rate: 72 Rhythm: normal sinus rhythm QRS Axis: normal Intervals: Right bundle branch block with left anterior fascicular block ST/T Wave abnormalities: normal Narrative Interpretation: Right bundle branch block with left anterior fascicular block.  Normal sinus rhythm no evidence of acute ischemia RADIOLOGY ED MD interpretation: X-ray of the left and right hip interpreted by me and shows an acute superiorly displaced left femoral neck fracture  One-view portable chest x-ray interpreted by me shows no evidence of acute abnormalities including no pneumonia, pneumothorax, or widened mediastinum -Agree with radiology assessment Official radiology report(s): DG HIP UNILAT WITH PELVIS 2-3 VIEWS LEFT  Result Date: 05/10/2022 CLINICAL DATA:  [id number].  Fall bilateral hip pain. EXAM: DG HIP (WITH OR WITHOUT PELVIS) 2-3V LEFT; DG HIP (WITH OR WITHOUT PELVIS) 2-3V RIGHT COMPARISON:  None Available. FINDINGS: Acute superiorly displaced left femoral neck  fracture. No left hip dislocation. There is no evidence of hip fracture or dislocation right hip. No acute displaced fracture or diastasis of the bones of the pelvis. There is no evidence of severe arthropathy or other focal bone abnormality. Vascular calcifications. IMPRESSION: 1. Acute superiorly displaced left femoral neck fracture. 2. No acute displaced fracture or dislocation of the right hip. 3. No acute displaced fracture or diastasis of the bones of the pelvis. Electronically Signed   By: Iven Finn M.D.   On: 05/10/2022 22:17   DG HIP UNILAT WITH PELVIS 2-3 VIEWS RIGHT  Result Date: 05/10/2022 CLINICAL DATA:  [id number].  Fall bilateral hip pain.  EXAM: DG HIP (WITH OR WITHOUT PELVIS) 2-3V LEFT; DG HIP (WITH OR WITHOUT PELVIS) 2-3V RIGHT COMPARISON:  None Available. FINDINGS: Acute superiorly displaced left femoral neck fracture. No left hip dislocation. There is no evidence of hip fracture or dislocation right hip. No acute displaced fracture or diastasis of the bones of the pelvis. There is no evidence of severe arthropathy or other focal bone abnormality. Vascular calcifications. IMPRESSION: 1. Acute superiorly displaced left femoral neck fracture. 2. No acute displaced fracture or dislocation of the right hip. 3. No acute displaced fracture or diastasis of the bones of the pelvis. Electronically Signed   By: Iven Finn M.D.   On: 05/10/2022 22:17   DG Chest 1 View  Result Date: 05/10/2022 CLINICAL DATA:  Bilateral hip pain after a fall. EXAM: CHEST  1 VIEW COMPARISON:  06/15/2017 FINDINGS: Postoperative changes in the mediastinum. Normal heart size and pulmonary vascularity. No focal airspace disease or consolidation in the lungs. No blunting of costophrenic angles. No pneumothorax. Mediastinal contours appear intact. IMPRESSION: No active disease. Electronically Signed   By: Lucienne Capers M.D.   On: 05/10/2022 22:15   PROCEDURES: Critical Care performed: No .1-3 Lead EKG Interpretation  Performed by: [name], MD Authorized by: [name], MD     Interpretation: normal     ECG rate:  74   ECG rate assessment: normal     Rhythm: sinus rhythm     Ectopy: none     Conduction: normal    MEDICATIONS ORDERED IN ED: Medications  atorvastatin (LIPITOR) tablet 40 mg (has no administration in time range)  nitroGLYCERIN (NITROSTAT) SL tablet 0.4 mg (has no administration in time range)  irbesartan (AVAPRO) tablet 37.5 mg (has no administration in time range)  levothyroxine (SYNTHROID) tablet 100 mcg (has no administration in time range)  HYDROcodone-acetaminophen (NORCO/VICODIN) 5-325 MG per tablet 1-2 tablet (has no  administration in time range)  morphine (PF) 2 MG/ML injection 0.5 mg (has no administration in time range)  insulin aspart (novoLOG) injection 0-9 Units (has no administration in time range)  methocarbamol (ROBAXIN) tablet 500 mg (has no administration in time range)    Or  methocarbamol (ROBAXIN) 500 mg in dextrose 5 % 50 mL IVPB (has no administration in time range)   IMPRESSION / MDM / ASSESSMENT AND PLAN / ED COURSE  I reviewed the triage vital signs and the nursing notes.                             The patient is on the cardiac monitor to evaluate for evidence of arrhythmia and/or significant heart rate changes. Patient's presentation is most consistent with acute presentation with potential threat to life or bodily function. Patient is a 81 year old female that presents for bilateral hip pain  Workup: XR hip Findings: Left femoral neck without dislocation Consult: Orthopedic Surgery (query fascia iliacus block vs continued opiate pain control), hospitalist  Patient does not currently demonstrate complications of fracture such as compartment syndrome, arterial or nerve injury.  Interventions: analgesia Disposition: Admit   FINAL CLINICAL IMPRESSION(S) / ED DIAGNOSES   Final diagnoses:  Closed displaced fracture of left femoral neck ([place])   Rx / DC Orders   ED Discharge Orders     None      Note:  This document was prepared using Dragon voice recognition software and may include unintentional dictation errors.   [name], MD 05/11/22 Judieth Keens, MD 05/11/22 [phone]

## 2022-05-10 NOTE — Assessment & Plan Note (Signed)
Patient is status post revascularization in 2022 Saw her vascular doctor in March 2023 with the following assessment "evidence of severe atherosclerotic changes of both lower extremities with rest pain ...should undergo angiography of the lower extremities with the hope for intervention for limb salvage.. Patient does not yet wish to proceed at this time but will contact her office when she is ready to move forward"  Aspirin and atorvastatin to continue.

## 2022-05-11 ENCOUNTER — Inpatient Hospital Stay: Payer: Medicare Other

## 2022-05-11 ENCOUNTER — Encounter: Payer: Self-pay | Admitting: Internal Medicine

## 2022-05-11 ENCOUNTER — Inpatient Hospital Stay: Payer: Medicare Other | Admitting: Anesthesiology

## 2022-05-11 ENCOUNTER — Other Ambulatory Visit: Payer: Medicare Other

## 2022-05-11 ENCOUNTER — Other Ambulatory Visit: Payer: Self-pay

## 2022-05-11 ENCOUNTER — Encounter
Admission: EM | Disposition: A | Discharge status: Skilled Nursing Facility | Payer: Self-pay | Source: Home / Self Care | Attending: Internal Medicine

## 2022-05-11 DIAGNOSIS — S72002A Fracture of unspecified part of neck of left femur, initial encounter for closed fracture: Secondary | ICD-10-CM | POA: Diagnosis not present

## 2022-05-11 DIAGNOSIS — Z01810 Encounter for preprocedural cardiovascular examination: Secondary | ICD-10-CM

## 2022-05-11 DIAGNOSIS — I2510 Atherosclerotic heart disease of native coronary artery without angina pectoris: Secondary | ICD-10-CM

## 2022-05-11 DIAGNOSIS — I25119 Atherosclerotic heart disease of native coronary artery with unspecified angina pectoris: Secondary | ICD-10-CM

## 2022-05-11 DIAGNOSIS — E440 Moderate protein-calorie malnutrition: Secondary | ICD-10-CM | POA: Insufficient documentation

## 2022-05-11 HISTORY — PX: APPLICATION OF WOUND VAC: SHX5189

## 2022-05-11 HISTORY — PX: HIP ARTHROPLASTY: SHX981

## 2022-05-11 LAB — GLUCOSE, CAPILLARY
Glucose-Capillary: 191 mg/dL — ABNORMAL HIGH (ref 70–99)
Glucose-Capillary: 243 mg/dL — ABNORMAL HIGH (ref 70–99)
Glucose-Capillary: 247 mg/dL — ABNORMAL HIGH (ref 70–99)
Glucose-Capillary: 261 mg/dL — ABNORMAL HIGH (ref 70–99)
Glucose-Capillary: 271 mg/dL — ABNORMAL HIGH (ref 70–99)
Glucose-Capillary: 289 mg/dL — ABNORMAL HIGH (ref 70–99)
Glucose-Capillary: 392 mg/dL — ABNORMAL HIGH (ref 70–99)
Glucose-Capillary: 82 mg/dL (ref 70–99)

## 2022-05-11 LAB — HEMOGLOBIN A1C
Hgb A1c MFr Bld: 8.8 % — ABNORMAL HIGH (ref 4.8–5.6)
Mean Plasma Glucose: 205.86 mg/dL

## 2022-05-11 LAB — VITAMIN D 25 HYDROXY (VIT D DEFICIENCY, FRACTURES): Vit D, 25-Hydroxy: 64.54 ng/mL (ref 30–100)

## 2022-05-11 SURGERY — HEMIARTHROPLASTY, HIP, DIRECT ANTERIOR APPROACH, FOR FRACTURE
Anesthesia: General | Site: Hip | Laterality: Left

## 2022-05-11 MED ORDER — VANCOMYCIN HCL 1000 MG IV SOLR
INTRAVENOUS | Status: AC
  Filled 2022-05-11: qty 20

## 2022-05-11 MED ORDER — OXYCODONE HCL 5 MG/5ML PO SOLN: 5.0000 mg | Freq: Once | ORAL | Status: DC | PRN

## 2022-05-11 MED ORDER — METHOCARBAMOL 500 MG PO TABS
500.0000 mg | ORAL_TABLET | Freq: Four times a day (QID) | ORAL | Status: DC | PRN
  Administered 2022-05-12 – 2022-05-14 (×2): 500 mg via ORAL
  Filled 2022-05-11 (×2): qty 1

## 2022-05-11 MED ORDER — FENTANYL CITRATE (PF) 100 MCG/2ML IJ SOLN
INTRAMUSCULAR | Status: AC
  Filled 2022-05-11: qty 2

## 2022-05-11 MED ORDER — NEOMYCIN-POLYMYXIN B GU 40-200000 IR SOLN
Status: AC
  Filled 2022-05-11: qty 20

## 2022-05-11 MED ORDER — HYDROCODONE-ACETAMINOPHEN 5-325 MG PO TABS
1.0000 | ORAL_TABLET | Freq: Four times a day (QID) | ORAL | Status: DC | PRN
  Administered 2022-05-11: 2 via ORAL
  Administered 2022-05-12 – 2022-05-13 (×2): 1 via ORAL
  Filled 2022-05-11 (×4): qty 1
  Filled 2022-05-11: qty 2

## 2022-05-11 MED ORDER — LABETALOL HCL 5 MG/ML IV SOLN
INTRAVENOUS | Status: AC
  Filled 2022-05-11: qty 4

## 2022-05-11 MED ORDER — BUPIVACAINE-EPINEPHRINE (PF) 0.5% -1:200000 IJ SOLN
INTRAMUSCULAR | Status: AC
  Filled 2022-05-11: qty 30

## 2022-05-11 MED ORDER — LACTATED RINGERS IV SOLN: INTRAVENOUS | Status: DC

## 2022-05-11 MED ORDER — POLYETHYLENE GLYCOL 3350 17 G PO PACK: 17.0000 g | PACK | Freq: Every day | ORAL | Status: DC

## 2022-05-11 MED ORDER — PROPOFOL 10 MG/ML IV BOLUS
INTRAVENOUS | Status: DC | PRN
  Administered 2022-05-11: 120 mg via INTRAVENOUS

## 2022-05-11 MED ORDER — 0.9 % SODIUM CHLORIDE (POUR BTL) OPTIME
TOPICAL | Status: DC | PRN
  Administered 2022-05-11: 500 mL

## 2022-05-11 MED ORDER — SUGAMMADEX SODIUM 500 MG/5ML IV SOLN
INTRAVENOUS | Status: DC | PRN
  Administered 2022-05-11: 279.6 mg via INTRAVENOUS

## 2022-05-11 MED ORDER — LABETALOL HCL 5 MG/ML IV SOLN
10.0000 mg | Freq: Once | INTRAVENOUS | Status: AC
  Administered 2022-05-11: 10 mg via INTRAVENOUS

## 2022-05-11 MED ORDER — ATORVASTATIN CALCIUM 20 MG PO TABS
40.0000 mg | ORAL_TABLET | Freq: Every day | ORAL | Status: DC
  Administered 2022-05-11 – 2022-05-14 (×4): 40 mg via ORAL
  Filled 2022-05-11 (×4): qty 2

## 2022-05-11 MED ORDER — ENSURE ENLIVE PO LIQD
237.0000 mL | Freq: Three times a day (TID) | ORAL | Status: DC
  Administered 2022-05-12 – 2022-05-14 (×8): 237 mL via ORAL
  Filled 2022-05-11: qty 237

## 2022-05-11 MED ORDER — BUPIVACAINE-EPINEPHRINE (PF) 0.5% -1:200000 IJ SOLN
INTRAMUSCULAR | Status: DC | PRN
  Administered 2022-05-11: 30 mL via PERINEURAL

## 2022-05-11 MED ORDER — ACETAMINOPHEN 10 MG/ML IV SOLN: 1000.0000 mg | Freq: Once | INTRAVENOUS | Status: DC | PRN

## 2022-05-11 MED ORDER — MORPHINE SULFATE (PF) 2 MG/ML IV SOLN
0.5000 mg | INTRAVENOUS | Status: DC | PRN
  Administered 2022-05-11: 0.5 mg via INTRAVENOUS
  Filled 2022-05-11: qty 1

## 2022-05-11 MED ORDER — FENTANYL CITRATE (PF) 100 MCG/2ML IJ SOLN
INTRAMUSCULAR | Status: DC | PRN
  Administered 2022-05-11 (×2): 50 ug via INTRAVENOUS
  Administered 2022-05-11: 100 ug via INTRAVENOUS
  Administered 2022-05-11: 50 ug via INTRAVENOUS

## 2022-05-11 MED ORDER — ADULT MULTIVITAMIN W/MINERALS CH
1.0000 | ORAL_TABLET | Freq: Every day | ORAL | Status: DC
  Administered 2022-05-12 – 2022-05-14 (×3): 1 via ORAL
  Filled 2022-05-11 (×3): qty 1

## 2022-05-11 MED ORDER — ASPIRIN 81 MG PO TBEC: 81.0000 mg | DELAYED_RELEASE_TABLET | Freq: Every day | ORAL | Status: DC

## 2022-05-11 MED ORDER — INSULIN ASPART 100 UNIT/ML IJ SOLN
0.0000 [IU] | INTRAMUSCULAR | Status: DC
  Administered 2022-05-11: 5 [IU] via SUBCUTANEOUS
  Administered 2022-05-11: 3 [IU] via SUBCUTANEOUS
  Administered 2022-05-11: 5 [IU] via SUBCUTANEOUS
  Administered 2022-05-11: 2 [IU] via SUBCUTANEOUS
  Administered 2022-05-12: 5 [IU] via SUBCUTANEOUS
  Administered 2022-05-12: 3 [IU] via SUBCUTANEOUS
  Administered 2022-05-12: 9 [IU] via SUBCUTANEOUS
  Filled 2022-05-11 (×7): qty 1

## 2022-05-11 MED ORDER — POLYETHYLENE GLYCOL 3350 17 G PO PACK
17.0000 g | PACK | Freq: Every day | ORAL | Status: DC
  Administered 2022-05-13: 17 g via ORAL
  Filled 2022-05-11 (×2): qty 1

## 2022-05-11 MED ORDER — SODIUM CHLORIDE 0.9 % IV SOLN: INTRAVENOUS | Status: DC

## 2022-05-11 MED ORDER — VANCOMYCIN HCL 1 G IV SOLR
INTRAVENOUS | Status: DC | PRN
  Administered 2022-05-11: 1000 mg via TOPICAL

## 2022-05-11 MED ORDER — ACETAMINOPHEN 10 MG/ML IV SOLN
INTRAVENOUS | Status: AC
  Filled 2022-05-11: qty 100

## 2022-05-11 MED ORDER — METHOCARBAMOL 1000 MG/10ML IJ SOLN: 500.0000 mg | Freq: Four times a day (QID) | INTRAVENOUS | Status: DC | PRN

## 2022-05-11 MED ORDER — OXYCODONE HCL 5 MG PO TABS: 5.0000 mg | ORAL_TABLET | Freq: Once | ORAL | Status: DC | PRN

## 2022-05-11 MED ORDER — ASPIRIN 81 MG PO CHEW
81.0000 mg | CHEWABLE_TABLET | Freq: Every day | ORAL | Status: DC
  Administered 2022-05-11: 81 mg via ORAL
  Filled 2022-05-11 (×2): qty 1

## 2022-05-11 MED ORDER — INSULIN ASPART 100 UNIT/ML IJ SOLN
INTRAMUSCULAR | Status: AC
  Administered 2022-05-11: 10 [IU] via SUBCUTANEOUS
  Filled 2022-05-11: qty 1

## 2022-05-11 MED ORDER — ACETAMINOPHEN 10 MG/ML IV SOLN
INTRAVENOUS | Status: DC | PRN
  Administered 2022-05-11: 1000 mg via INTRAVENOUS

## 2022-05-11 MED ORDER — FENTANYL CITRATE (PF) 100 MCG/2ML IJ SOLN: 25.0000 ug | INTRAMUSCULAR | Status: DC | PRN

## 2022-05-11 MED ORDER — IRBESARTAN 75 MG PO TABS
37.5000 mg | ORAL_TABLET | Freq: Every day | ORAL | Status: DC
  Administered 2022-05-11 – 2022-05-14 (×4): 37.5 mg via ORAL
  Filled 2022-05-11 (×4): qty 0.5

## 2022-05-11 MED ORDER — ONDANSETRON HCL 4 MG/2ML IJ SOLN
INTRAMUSCULAR | Status: DC | PRN
  Administered 2022-05-11: 4 mg via INTRAVENOUS

## 2022-05-11 MED ORDER — ESMOLOL HCL 100 MG/10ML IV SOLN
INTRAVENOUS | Status: DC | PRN
  Administered 2022-05-11: 10 mg via INTRAVENOUS

## 2022-05-11 MED ORDER — CLONIDINE HCL 0.1 MG PO TABS
0.2000 mg | ORAL_TABLET | Freq: Two times a day (BID) | ORAL | Status: DC
  Administered 2022-05-11 – 2022-05-14 (×5): 0.2 mg via ORAL
  Filled 2022-05-11 (×6): qty 2

## 2022-05-11 MED ORDER — PHENYLEPHRINE 80 MCG/ML (10ML) SYRINGE FOR IV PUSH (FOR BLOOD PRESSURE SUPPORT)
PREFILLED_SYRINGE | INTRAVENOUS | Status: DC | PRN
  Administered 2022-05-11 (×3): 80 ug via INTRAVENOUS

## 2022-05-11 MED ORDER — CEFAZOLIN SODIUM-DEXTROSE 2-4 GM/100ML-% IV SOLN
2.0000 g | INTRAVENOUS | Status: AC
  Administered 2022-05-11: 2 g via INTRAVENOUS
  Filled 2022-05-11: qty 100

## 2022-05-11 MED ORDER — PROPOFOL 10 MG/ML IV BOLUS
INTRAVENOUS | Status: AC
  Filled 2022-05-11: qty 20

## 2022-05-11 MED ORDER — ROCURONIUM BROMIDE 100 MG/10ML IV SOLN
INTRAVENOUS | Status: DC | PRN
  Administered 2022-05-11 (×2): 50 mg via INTRAVENOUS

## 2022-05-11 MED ORDER — NITROGLYCERIN 0.4 MG SL SUBL
0.4000 mg | SUBLINGUAL_TABLET | SUBLINGUAL | Status: DC | PRN
Start: 2022-05-11 — End: 2022-05-14

## 2022-05-11 MED ORDER — LEVOTHYROXINE SODIUM 50 MCG PO TABS
100.0000 ug | ORAL_TABLET | Freq: Every day | ORAL | Status: DC
  Administered 2022-05-11 – 2022-05-14 (×4): 100 ug via ORAL
  Filled 2022-05-11 (×4): qty 2

## 2022-05-11 MED ORDER — LACTATED RINGERS IV SOLN: INTRAVENOUS | Status: DC | PRN

## 2022-05-11 MED ORDER — ONDANSETRON HCL 4 MG/2ML IJ SOLN: 4.0000 mg | Freq: Once | INTRAMUSCULAR | Status: DC | PRN

## 2022-05-11 MED ORDER — CEFAZOLIN SODIUM-DEXTROSE 2-4 GM/100ML-% IV SOLN
2.0000 g | Freq: Three times a day (TID) | INTRAVENOUS | Status: AC
  Administered 2022-05-12 (×3): 2 g via INTRAVENOUS
  Filled 2022-05-11 (×2): qty 100

## 2022-05-11 MED ORDER — LIDOCAINE HCL (CARDIAC) PF 100 MG/5ML IV SOSY
PREFILLED_SYRINGE | INTRAVENOUS | Status: DC | PRN
  Administered 2022-05-11: 60 mg via INTRAVENOUS

## 2022-05-11 MED ORDER — DEXTROSE-NACL 5-0.9 % IV SOLN: INTRAVENOUS | Status: DC

## 2022-05-11 MED ORDER — SENNA 8.6 MG PO TABS
1.0000 | ORAL_TABLET | Freq: Every day | ORAL | Status: DC
  Administered 2022-05-11 – 2022-05-12 (×2): 8.6 mg via ORAL
  Filled 2022-05-11 (×2): qty 1

## 2022-05-11 MED ORDER — INSULIN GLARGINE-YFGN 100 UNIT/ML ~~LOC~~ SOLN
15.0000 [IU] | Freq: Every day | SUBCUTANEOUS | Status: DC
  Administered 2022-05-12 – 2022-05-13 (×2): 15 [IU] via SUBCUTANEOUS
  Filled 2022-05-11 (×5): qty 0.15

## 2022-05-11 MED ORDER — INSULIN ASPART 100 UNIT/ML IJ SOLN: 10.0000 [IU] | Freq: Once | INTRAMUSCULAR | Status: AC

## 2022-05-11 MED ORDER — INSULIN GLARGINE-YFGN 100 UNIT/ML ~~LOC~~ SOLN
15.0000 [IU] | Freq: Every day | SUBCUTANEOUS | Status: DC
  Filled 2022-05-11: qty 0.15

## 2022-05-11 SURGICAL SUPPLY — 72 items
BLADE SAGITTAL WIDE XTHICK NO (BLADE) ×3 IMPLANT
BNDG COHESIVE 4X5 TAN ST LF (GAUZE/BANDAGES/DRESSINGS) ×3 IMPLANT
CANISTER WOUND CARE 500ML ATS (WOUND CARE) ×1 IMPLANT
CEMENT BONE 40GM (Cement) ×6 IMPLANT
CHLORAPREP W/TINT 26 (MISCELLANEOUS) ×3 IMPLANT
COVER BACK TABLE REUSABLE LG (DRAPES) ×3 IMPLANT
DRAPE 3/4 80X56 (DRAPES) ×6 IMPLANT
DRAPE IMP U-DRAPE 54X76 (DRAPES) ×3 IMPLANT
DRAPE INCISE IOBAN 66X45 STRL (DRAPES) ×2 IMPLANT
DRAPE INCISE IOBAN 66X60 STRL (DRAPES) ×3 IMPLANT
DRAPE ORTHO SPLIT 77X108 STRL (DRAPES)
DRAPE SURG 17X11 SM STRL (DRAPES) ×3 IMPLANT
DRAPE SURG ORHT 6 SPLT 77X108 (DRAPES) ×4 IMPLANT
DRSG AQUACEL AG ADV 3.5X10 (GAUZE/BANDAGES/DRESSINGS) ×3 IMPLANT
DRSG OPSITE POSTOP 4X10 (GAUZE/BANDAGES/DRESSINGS) ×3 IMPLANT
DURAPREP 26ML APPLICATOR (WOUND CARE) ×4 IMPLANT
ELECT CAUTERY BLADE 6.4 (BLADE) ×3 IMPLANT
ELECT REM PT RETURN 9FT ADLT (ELECTROSURGICAL) ×3
ELECTRODE REM PT RTRN 9FT ADLT (ELECTROSURGICAL) ×2 IMPLANT
GAUZE 4X4 16PLY ~~LOC~~+RFID DBL (SPONGE) ×2 IMPLANT
GAUZE SPONGE 4X4 12PLY STRL (GAUZE/BANDAGES/DRESSINGS) ×3 IMPLANT
GAUZE XEROFORM 1X8 LF (GAUZE/BANDAGES/DRESSINGS) ×4 IMPLANT
GLOVE BIO SURGEON STRL SZ7.5 (GLOVE) ×3 IMPLANT
GLOVE SURG UNDER POLY LF SZ7.5 (GLOVE) ×3 IMPLANT
GOWN STRL REUS W/ TWL XL LVL3 (GOWN DISPOSABLE) ×2 IMPLANT
GOWN STRL REUS W/TWL XL LVL3 (GOWN DISPOSABLE) ×1
HEAD FEM UNIPOLAR 45 OD STRL (Hips) ×1 IMPLANT
HEMOVAC 400ML (MISCELLANEOUS)
HOLSTER ELECTROSUGICAL PENCIL (MISCELLANEOUS) ×3 IMPLANT
HOOD PEEL AWAY FLYTE STAYCOOL (MISCELLANEOUS) ×4 IMPLANT
IV NS IRRIG 3000ML ARTHROMATIC (IV SOLUTION) ×3 IMPLANT
KIT DRAIN HEMOVAC JP 7FR 400ML (MISCELLANEOUS) IMPLANT
KIT PREP HIP W/CEMENT RESTRICT (Miscellaneous) IMPLANT
KIT PREPARATION TOTAL HIP (Miscellaneous) ×1 IMPLANT
KIT PREVENA INCISION MGT20CM45 (CANNISTER) ×1 IMPLANT
KIT TURNOVER KIT A (KITS) ×3 IMPLANT
MANIFOLD NEPTUNE II (INSTRUMENTS) ×3 IMPLANT
NDL FILTER BLUNT 18X1 1/2 (NEEDLE) ×2 IMPLANT
NDL MAYO CATGUT SZ4 TPR NDL (NEEDLE) ×2 IMPLANT
NDL SAFETY ECLIPSE 18X1.5 (NEEDLE) ×2 IMPLANT
NEEDLE FILTER BLUNT 18X 1/2SAF (NEEDLE)
NEEDLE FILTER BLUNT 18X1 1/2 (NEEDLE) IMPLANT
NEEDLE HYPO 18GX1.5 SHARP (NEEDLE) ×1
NEEDLE MAYO CATGUT SZ4 (NEEDLE) ×3 IMPLANT
NS IRRIG 1000ML POUR BTL (IV SOLUTION) ×2 IMPLANT
NS IRRIG 500ML POUR BTL (IV SOLUTION) ×1 IMPLANT
PACK HIP PROSTHESIS (MISCELLANEOUS) ×3 IMPLANT
PILLOW ABDUCTION FOAM SM (MISCELLANEOUS) ×3 IMPLANT
PRESSURIZER FEM CANAL M (MISCELLANEOUS) IMPLANT
PULSAVAC PLUS IRRIG FAN TIP (DISPOSABLE) ×3
RETRIEVER SUT HEWSON (MISCELLANEOUS) ×3 IMPLANT
SPACER DEPUY (Hips) ×1 IMPLANT
SPONGE T-LAP 18X18 ~~LOC~~+RFID (SPONGE) ×10 IMPLANT
STAPLER SKIN PROX 35W (STAPLE) ×3 IMPLANT
STEM CEMENTED SUMMIT SZ2 (Stem) ×1 IMPLANT
STEM DIST FEM CENTRALIZR 11 (Hips) ×1 IMPLANT
SUT ETHIBOND 2 V 37 (SUTURE) ×3 IMPLANT
SUT QUILL PDO 0 36 36 VIOLET (SUTURE) ×3 IMPLANT
SUT TICRON 2-0 30IN 311381 (SUTURE) ×10 IMPLANT
SUT VIC AB 0 CT1 36 (SUTURE) ×3 IMPLANT
SUT VIC AB 2-0 CT1 27 (SUTURE)
SUT VIC AB 2-0 CT1 TAPERPNT 27 (SUTURE) ×4 IMPLANT
SUT VIC AB 2-0 CT2 27 (SUTURE) ×6 IMPLANT
SYR 10ML LL (SYRINGE) ×3 IMPLANT
TAPE MICROFOAM 4IN (TAPE) IMPLANT
TAPE TRANSPORE STRL 2 31045 (GAUZE/BANDAGES/DRESSINGS) ×2 IMPLANT
TIP BRUSH PULSAVAC PLUS 24.33 (MISCELLANEOUS) ×2 IMPLANT
TIP FAN IRRIG PULSAVAC PLUS (DISPOSABLE) ×2 IMPLANT
TOWER CARTRIDGE SMART MIX (DISPOSABLE) ×1 IMPLANT
TRAY FOLEY SLVR 16FR LF STAT (SET/KITS/TRAYS/PACK) ×3 IMPLANT
TUBE SUCT KAM VAC (TUBING) IMPLANT
WATER STERILE IRR 500ML POUR (IV SOLUTION) ×3 IMPLANT

## 2022-05-11 NOTE — Assessment & Plan Note (Signed)
No complaints of chest pain and EKG nonacute.  We will get a baseline troponin Can resume aspirin, atorvastatin postoperatively Nitroglycerin sublingual as needed chest pain

## 2022-05-11 NOTE — Anesthesia Preprocedure Evaluation (Addendum)
Anesthesia Evaluation  Patient identified by MRN, date of birth, ID band Patient awake    Reviewed: Allergy & Precautions, NPO status , Patient's Chart, lab work & pertinent test results  History of Anesthesia Complications Negative for: history of anesthetic complications  Airway Mallampati: III  TM Distance: >3 FB Neck ROM: Full    Dental  (+) Teeth Intact   Pulmonary neg pulmonary ROS, neg sleep apnea, neg COPD, Patient abstained from smoking.Not current smoker,    Pulmonary exam normal breath sounds clear to auscultation       Cardiovascular Exercise Tolerance: Good METS: 3 - Mets hypertension, + CAD (s/p 3V CABG 2018) and + Peripheral Vascular Disease (s/p LE stents on Plavix, last dose 05/10/22)  (-) Past MI (-) dysrhythmias + Valvular Problems/Murmurs (moderate TR) MR  Rhythm:Regular Rate:Normal - Systolic murmurs ECG 0/16/01: Sinus rhythm RBBB and LAFB Left ventricular hypertrophy Baseline wander in lead(s) II III aVF  TTE: 1. Left ventricular ejection fraction, by estimation, is 60 to 65%. The  left ventricle has normal function. The left ventricle has no regional  wall motion abnormalities. There is mild LVH with moderate asymmetric left  ventricular hypertrophy of the  basal-septal segment. Left ventricular diastolic parameters are consistent  with Grade I diastolic dysfunction (impaired relaxation).  2. Right ventricular systolic function is normal. The right ventricular  size is normal. There is normal pulmonary artery systolic pressure. The  estimated right ventricular systolic pressure is 09.3 mmHg.  3. Left atrial size was moderately dilated.  4. The mitral valve is normal in structure. Mild mitral valve  regurgitation. No evidence of mitral stenosis. Moderate mitral annular  calcification.  5. Tricuspid valve regurgitation is moderate.  6. The aortic valve is normal in structure. Aortic valve  regurgitation is  not visualized. No aortic stenosis is present.  7. There is mild dilatation of the ascending aorta, measuring 42 mm.  8. The inferior vena cava is normal in size with greater than 50%  respiratory variability, suggesting right atrial pressure of 3 mmHg.    Neuro/Psych CVA (01/2022, no residual symptoms), No Residual Symptoms negative psych ROS   GI/Hepatic neg GERD  ,(+)     (-) substance abuse  ,   Endo/Other  diabetes, Poorly Controlled, Type 2, Insulin DependentHypothyroidism   Renal/GU negative Renal ROS     Musculoskeletal   Abdominal   Peds  Hematology   Anesthesia Other Findings Cardiology note 05/11/22:  1.  CAD/Preoperative Cardiovascular Risk Assessment:  pt w/ h/o CAD s/p CABG x 3 in 2018.  Nl EF by echo in 08/2020.  She has done reasonably well over the years w/o chest pain.  She presented to the ED 7/25 following a mechanical fall and suffered a L femoral neck fracture.  She denies any recent chest pain.  B/c of her CAD/Stroke/DM history, her risk of a major perioperative cardiac event calculates to 11%.She does have chronic, stable DOE after walking about 30 yds, though she is able to perform moderate-level housework and climb a flight of stairs w/o significant symptoms (max achievable METS = 5.5).  In that setting, she may proceed to the OR w/o further preoperative ischemic/CV testing.  From our standpoint, plavix may be held preoperatively, w/ plan to resume post-op, esp in setting of stroke in 01/2022 and PAD.  We do recommend continuation of asa and statin rx throughout the perioperative period.  2.  Essential hypertension: Blood pressure currently elevated.  Resume home doses of ARB and clonidine therapy.  3.  Hyperlipidemia: LDL at goal, at 15 in November 2022.  Continue statin therapy.  4.  Peripheral arterial disease: Status post right lower extremity intervention in May 2022.  She reported claudication symptoms in March with stable, abnormal  ABIs at that time and recommendation for angiography, which she deferred.  Today, she denies any recent claudication.  Continue aspirin and statin therapy.  Plavix currently on hold.  Follow-up with vascular surgery as an outpatient.  5.  Type 2 diabetes mellitus: A1c 8.8.  On insulin, ARB, and statin therapy as an outpatient.  Consider SGLT2 inhibitor if not cost prohibitive.  6.  Stroke: This occurred in April 2023 and she subsequently followed up with neurology.  No A-fib/flutter on prior outpatient monitoring.  Continue aspirin and statin.  Plavix on hold.  7.  PSVT: Previously noted on event monitoring.  Asymptomatic.  Brief run on telemetry overnight.  Follow.  8.  Normocytic anemia:  H/H down from prior baseline @ 10.1/31.2.  Follow periop.  Reproductive/Obstetrics                           Anesthesia Physical Anesthesia Plan  ASA: 3  Anesthesia Plan: General   Post-op Pain Management: Ofirmev IV (intra-op)*   Induction: Intravenous  PONV Risk Score and Plan: 4 or greater and Ondansetron, Dexamethasone and Treatment may vary due to age or medical condition  Airway Management Planned: Oral ETT  Additional Equipment: None  Intra-op Plan:   Post-operative Plan: Extubation in OR  Informed Consent: I have reviewed the patients History and Physical, chart, labs and discussed the procedure including the risks, benefits and alternatives for the proposed anesthesia with the patient or authorized representative who has indicated his/her understanding and acceptance.     Dental advisory given  Plan Discussed with: CRNA and Surgeon  Anesthesia Plan Comments: (Discussed risks of anesthesia with patient, including PONV, sore throat, lip/dental/eye damage. Rare risks discussed as well, such as cardiorespiratory and neurological sequelae, and allergic reactions. Discussed the role of CRNA in patient's perioperative care. Patient understands. Plavix taken  yesterday, precludes spinal.)       Anesthesia Quick Evaluation

## 2022-05-11 NOTE — Progress Notes (Addendum)
PROGRESS NOTE    Carrie Kane  RCV:[id number] DOB: 1941/10/05 DOA: 05/10/2022 PCP: Cletis Athens, MD  Outpatient Specialists: vascular surgery, cardiology    Brief Narrative:   From admission h and p Carrie Kane is a 81 y.o. female with medical history significant for CAD status post coronary artery bypass grafting in 2018, stroke in May 2023 without residual deficits, hypertension, hyperlipidemia, insulin-dependent diabetes, peripheral arterial disease s/p revascularization 2022 with recent recommendation for repeat procedure due to intermittent claudication, and diastolic dysfunction, who presents to the ED following a fall from a standing position.  She denies preceding headache, visual disturbance, one-sided numbness tingling or weakness in the face or extremities and denied preceding lightheadedness, palpitations, chest pain or shortness of breath.  Assessment & Plan:   Principal Problem:   Left displaced femoral neck fracture (HCC) Active Problems:   Fall at home, initial encounter   Preoperative clearance   Type 2 diabetes mellitus without complication, with long-term current use of insulin ([place])   CAD with Hx of CABG   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) ([place])   Anemia   Hypothyroidism   Essential hypertension   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)  # Left displaced femoral neck fracture Plan for operative repair later today. Ct head neg, patient complains of no other injuries. - pt/ot consults tomorrow - pain control, bowel regimen - f/u vit d, will need osteoporosis tx as outpt - monitor post-op anemia, transfuse as needed - plavix on hold  # CAD Cabg x3 in 2018. No chest pain. Has been evaluated by cardiology for pre-op cardiac clearance and has been cleared to proceed w/ surgery - home aspirin, statin; plavix on hold  # PAD Seen by vascular earlier this year, has limb-threatening ischemia, they advised angiography, patient  has declined for now. Currently LEs are warm, no claudication symptoms - aspirin, plavix, and statin as above - outpt vascular f/u  # CVA Recent. No new neurologic symptoms - asa/plavix/statin as above  # HTN Here bp elevated - home clonidine  # DM Here glucose elevated - semglee 15 qhs for home lantus 20 qhs - SSI  # Hypothyroid - home levothyroxine    DVT prophylaxis: SCDs Code Status: full Family Communication: none @ bedside  Level of care: Med-Surg Status is: Inpatient Remains inpatient appropriate because: inpatient surgery    Consultants:  Ortho, cardiology  Procedures: pending  Antimicrobials:  Periop abx per ortho    Subjective: Left hip pain moderate, no other complaints  Objective: Vitals:   05/10/22 2305 05/11/22 0054 05/11/22 0432 05/11/22 0843  BP: (!) 196/64 (!) 174/61 (!) 163/60 (!) 165/60  Pulse: 73 65 70 85  Resp: '15 20 18 16  '$ Temp:  98.2 F (36.8 C) 98.2 F (36.8 C) 99 F (37.2 C)  TempSrc:   Oral   SpO2: 100% 100% 100% 98%  Weight:      Height:       No intake or output data in the 24 hours ending 05/11/22 0943 Filed Weights   05/10/22 2141  Weight: 69.9 kg    Examination:  General exam: Appears calm and comfortable  Respiratory system: Clear to auscultation. Respiratory effort normal. Cardiovascular system: S1 & S2 heard, RRR. Mod systolic murmur Gastrointestinal system: Abdomen is nondistended, soft and nontender. No organomegaly or masses felt. Normal bowel sounds heard. Central nervous system: Alert and oriented. No focal neurological deficits. Extremities: external rotation LLE Skin: No rashes, lesions or ulcers. Extremities are warm Psychiatry: Judgement and  insight appear normal. Mood & affect appropriate.     Data Reviewed: I have personally reviewed following labs and imaging studies  CBC: Recent Labs  Lab 05/10/22 2138  WBC 4.8  HGB 10.1*  HCT 31.2*  MCV 89.9  PLT 425   Basic Metabolic  Panel: Recent Labs  Lab 05/10/22 2138  NA 136  K 3.6  CL 102  CO2 26  GLUCOSE 128*  BUN 16  CREATININE 0.78  CALCIUM 9.6   GFR: Estimated Creatinine Clearance: 57.5 mL/min (by C-G formula based on SCr of 0.78 mg/dL). Liver Function Tests: Recent Labs  Lab 05/10/22 2138  AST 28  ALT 18  ALKPHOS 53  BILITOT 0.7  PROT 6.4*  ALBUMIN 3.8   No results for input(s): "LIPASE", "AMYLASE" in the last 168 hours. No results for input(s): "AMMONIA" in the last 168 hours. Coagulation Profile: Recent Labs  Lab 05/10/22 2138  INR 1.1   Cardiac Enzymes: No results for input(s): "CKTOTAL", "CKMB", "CKMBINDEX", "TROPONINI" in the last 168 hours. BNP (last 3 results) No results for input(s): "PROBNP" in the last 8760 hours. HbA1C: Recent Labs    05/10/22 2138  HGBA1C 8.8*   CBG: Recent Labs  Lab 05/11/22 0057 05/11/22 0350 05/11/22 0750  GLUCAP 82 191* 243*   Lipid Profile: No results for input(s): "CHOL", "HDL", "LDLCALC", "TRIG", "CHOLHDL", "LDLDIRECT" in the last 72 hours. Thyroid Function Tests: No results for input(s): "TSH", "T4TOTAL", "FREET4", "T3FREE", "THYROIDAB" in the last 72 hours. Anemia Panel: No results for input(s): "VITAMINB12", "FOLATE", "FERRITIN", "TIBC", "IRON", "RETICCTPCT" in the last 72 hours. Urine analysis:    Component Value Date/Time   COLORURINE STRAW (A) 05/03/2017 1152   APPEARANCEUR CLEAR 05/03/2017 1152   LABSPEC 1.011 05/03/2017 1152   PHURINE 6.0 05/03/2017 1152   GLUCOSEU >=500 (A) 05/03/2017 1152   HGBUR NEGATIVE 05/03/2017 1152   BILIRUBINUR NEGATIVE 05/03/2017 1152   KETONESUR NEGATIVE 05/03/2017 1152   PROTEINUR NEGATIVE 05/03/2017 1152   NITRITE NEGATIVE 05/03/2017 1152   LEUKOCYTESUR LARGE (A) 05/03/2017 1152   Sepsis Labs: '@LABRCNTIP'$ (procalcitonin:4,lacticidven:4)  )No results found for this or any previous visit (from the past 240 hour(s)).       Radiology Studies: CT HEAD WO CONTRAST (5MM)  Result Date:  05/11/2022 CLINICAL DATA:  Fall EXAM: CT HEAD WITHOUT CONTRAST CT CERVICAL SPINE WITHOUT CONTRAST TECHNIQUE: Multidetector CT imaging of the head and cervical spine was performed following the standard protocol without intravenous contrast. Multiplanar CT image reconstructions of the cervical spine were also generated. RADIATION DOSE REDUCTION: This exam was performed according to the departmental dose-optimization program which includes automated exposure control, adjustment of the mA and/or kV according to patient size and/or use of iterative reconstruction technique. COMPARISON:  MR head/neck dated 02/24/2022 FINDINGS: CT HEAD FINDINGS Brain: No evidence of acute infarction, hemorrhage, hydrocephalus, extra-axial collection or mass lesion/mass effect. Subcortical white matter and periventricular small vessel ischemic changes. Vascular: Intracranial atherosclerosis. Skull: Normal. Negative for fracture or focal lesion. Sinuses/Orbits: The visualized paranasal sinuses are essentially clear. The mastoid air cells are unopacified. Other: None. CT CERVICAL SPINE FINDINGS Alignment: Reversal of the normal cervical lordosis, likely positional. Skull base and vertebrae: No acute fracture. No primary bone lesion or focal pathologic process. Soft tissues and spinal canal: No prevertebral fluid or swelling. No visible canal hematoma. Disc levels: Mild degenerative changes of the mid cervical spine. Spinal canal is patent. Upper chest: Visualized lung apices are clear. Other: Visualized thyroid is unremarkable. IMPRESSION: No evidence of acute intracranial abnormality.  Small vessel ischemic changes. No evidence of acute traumatic injury to the cervical spine. Mild degenerative changes. Electronically Signed   By: Julian Hy M.D.   On: 05/11/2022 01:34   CT CERVICAL SPINE WO CONTRAST  Result Date: 05/11/2022 CLINICAL DATA:  Fall EXAM: CT HEAD WITHOUT CONTRAST CT CERVICAL SPINE WITHOUT CONTRAST TECHNIQUE:  Multidetector CT imaging of the head and cervical spine was performed following the standard protocol without intravenous contrast. Multiplanar CT image reconstructions of the cervical spine were also generated. RADIATION DOSE REDUCTION: This exam was performed according to the departmental dose-optimization program which includes automated exposure control, adjustment of the mA and/or kV according to patient size and/or use of iterative reconstruction technique. COMPARISON:  MR head/neck dated 02/24/2022 FINDINGS: CT HEAD FINDINGS Brain: No evidence of acute infarction, hemorrhage, hydrocephalus, extra-axial collection or mass lesion/mass effect. Subcortical white matter and periventricular small vessel ischemic changes. Vascular: Intracranial atherosclerosis. Skull: Normal. Negative for fracture or focal lesion. Sinuses/Orbits: The visualized paranasal sinuses are essentially clear. The mastoid air cells are unopacified. Other: None. CT CERVICAL SPINE FINDINGS Alignment: Reversal of the normal cervical lordosis, likely positional. Skull base and vertebrae: No acute fracture. No primary bone lesion or focal pathologic process. Soft tissues and spinal canal: No prevertebral fluid or swelling. No visible canal hematoma. Disc levels: Mild degenerative changes of the mid cervical spine. Spinal canal is patent. Upper chest: Visualized lung apices are clear. Other: Visualized thyroid is unremarkable. IMPRESSION: No evidence of acute intracranial abnormality. Small vessel ischemic changes. No evidence of acute traumatic injury to the cervical spine. Mild degenerative changes. Electronically Signed   By: Julian Hy M.D.   On: 05/11/2022 01:34   DG HIP UNILAT WITH PELVIS 2-3 VIEWS LEFT  Result Date: 05/10/2022 CLINICAL DATA:  [id number].  Fall bilateral hip pain. EXAM: DG HIP (WITH OR WITHOUT PELVIS) 2-3V LEFT; DG HIP (WITH OR WITHOUT PELVIS) 2-3V RIGHT COMPARISON:  None Available. FINDINGS: Acute superiorly  displaced left femoral neck fracture. No left hip dislocation. There is no evidence of hip fracture or dislocation right hip. No acute displaced fracture or diastasis of the bones of the pelvis. There is no evidence of severe arthropathy or other focal bone abnormality. Vascular calcifications. IMPRESSION: 1. Acute superiorly displaced left femoral neck fracture. 2. No acute displaced fracture or dislocation of the right hip. 3. No acute displaced fracture or diastasis of the bones of the pelvis. Electronically Signed   By: Iven Finn M.D.   On: 05/10/2022 22:17   DG HIP UNILAT WITH PELVIS 2-3 VIEWS RIGHT  Result Date: 05/10/2022 CLINICAL DATA:  [id number].  Fall bilateral hip pain. EXAM: DG HIP (WITH OR WITHOUT PELVIS) 2-3V LEFT; DG HIP (WITH OR WITHOUT PELVIS) 2-3V RIGHT COMPARISON:  None Available. FINDINGS: Acute superiorly displaced left femoral neck fracture. No left hip dislocation. There is no evidence of hip fracture or dislocation right hip. No acute displaced fracture or diastasis of the bones of the pelvis. There is no evidence of severe arthropathy or other focal bone abnormality. Vascular calcifications. IMPRESSION: 1. Acute superiorly displaced left femoral neck fracture. 2. No acute displaced fracture or dislocation of the right hip. 3. No acute displaced fracture or diastasis of the bones of the pelvis. Electronically Signed   By: Iven Finn M.D.   On: 05/10/2022 22:17   DG Chest 1 View  Result Date: 05/10/2022 CLINICAL DATA:  Bilateral hip pain after a fall. EXAM: CHEST  1 VIEW COMPARISON:  06/15/2017 FINDINGS: Postoperative changes in the  mediastinum. Normal heart size and pulmonary vascularity. No focal airspace disease or consolidation in the lungs. No blunting of costophrenic angles. No pneumothorax. Mediastinal contours appear intact. IMPRESSION: No active disease. Electronically Signed   By: Lucienne Capers M.D.   On: 05/10/2022 22:15        Scheduled Meds:  aspirin  81  mg Oral Daily   atorvastatin  40 mg Oral Daily   insulin aspart  0-9 Units Subcutaneous Q4H   irbesartan  37.5 mg Oral Daily   levothyroxine  100 mcg Oral Q0600   Continuous Infusions:  dextrose 5 % and 0.9% NaCl 75 mL/hr at 05/11/22 0133   methocarbamol (ROBAXIN) IV       LOS: 1 day     Desma Maxim, MD [facility]   If 7PM-7AM, please contact night-coverage [email or web address] Password [facility] 05/11/2022, 9:43 AM

## 2022-05-11 NOTE — Consult Note (Signed)
Cardiology Consult    Patient ID: Carrie Kane MRN: [id number], DOB/AGE: 79-Jun-1942   Admit date: 05/10/2022 Date of Consult: 05/11/2022  Primary Physician: Cletis Athens, MD Primary Cardiologist: Ida Rogue, MD Requesting Provider: Imagene Riches, MD  Patient Profile    Carrie Kane is a 81 y.o. female with a history of coronary artery disease status post CABG in 2018, stroke in April 2023, hypertension, hyperlipidemia, PSVT, insulin-dependent diabetes, peripheral arterial disease, and diastolic dysfunction, who is being seen today for the evaluation of preoperative cardiovascular examination at the request of Dr. Si Raider.  Past Medical History   Past Medical History:  Diagnosis Date   CAD (coronary artery disease)    a. 04/2017 Cath: LM 25, LAD 80p, 33m LCX 95ost, 71mEF 45-50%; b. 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   Diastolic dysfunction    a. 04/2017 Echo: EF 55-60%, no rwma, Gr1 DD, mildly dil LA; b. 08/2020 Echo: EF 55-60%, no rwma, GrII DD, nl RV fxn, RVSP 3065m, mod dil LA, mildly dil RA, Mod TR.   Hyperlipidemia    Hypertensive heart disease    Insulin dependent diabetes mellitus    PAD (peripheral artery disease) ([facility]  a. 02/2021 PTA/DCBA R Peroneal, R Popliteal, distal R SFA.   PSVT (paroxysmal supraventricular tachycardia) ([facility]  a. 02/2022 Zio: Predominantly sinus rhythm @ 61 (36-218). 2 NSVT runs (fastest/longest 6 beats @ 218). 37 SVT/A tach runs (fastest 185 x 5 beats, longest 32.4 secs @ 107). Triggered events = RSR, PAC.   [facility]  [facility]. 01/2022 R sided wkns/aphasia/tremor/slurred speech-->Ss resolved; b. 02/2022 MRI/A: punctate subacute inf vs artifact-post limb of L int capsule. 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 Mod, chronic small vessel isch changes within the cerebral white matter.  Subcm chronic infarct- L cerebellar hemisphere.  Sev dzs prox P2 seg of R PCA. Mod dzs A1 R antClinical cytogeneticist  Past Surgical  History:  Procedure Laterality Date   ABDOMINAL HYSTERECTOMY     CATARACT EXTRACTION W/PHACO Right 10/26/2021   Procedure: CATARACT EXTRACTION PHACO AND INTRAOCULAR LENS PLACEMENT (IOC) RIGHT DIABETIC 16.27 01:23.8;  Surgeon: KinEulogio BearD;  [place] [facility] [place];  Laterality: Right;  Please leave arrival at 8:00   CATARACT EXTRACTION W/PHACO Left 11/09/2021   Procedure: CATARACT EXTRACTION PHACO AND INTRAOCULAR LENS PLACEMENT ([place]OCFairfieldEFT DIABETIC;  Surgeon: KinEulogio BearD;  [place] [facility];  Laterality: Left;  13.39 01:13.0   CORONARY ARTERY BYPASS GRAFT N/A 05/09/2017   Procedure: CORONARY ARTERY BYPASS GRAFTING (CABG) x 3 using left internal mammary artery and right greater saphenous vein harvested endoscopically;  Surgeon: VanIvin PootD;  [facility];  Laterality: N/A;   INTRAOPERATIVE TRANSESOPHAGEAL ECHOCARDIOGRAM N/A 05/09/2017   Procedure: INTRAOPERATIVE TRANSESOPHAGEAL ECHOCARDIOGRAM;  Surgeon: VanIvin PootD;  [facility] [facility];  Laterality: N/A;   IR RADIOLOGIST EVAL & MGMT  12/04/2020   LEFT HEART CATH AND CORONARY ANGIOGRAPHY N/A 05/02/2017   Procedure: Left Heart Cath and Coronary Angiography;  Surgeon: AriWellington HampshireD;  [facility];  Service: Cardiovascular;  Laterality: N/A;   LOWER EXTREMITY ANGIOGRAPHY Left 02/09/2021   Procedure: LOWER EXTREMITY ANGIOGRAPHY;  Surgeon: DewAlgernon HuxleyD;  [place];  Service: Cardiovascular;  Laterality: Left;   LOWER EXTREMITY ANGIOGRAPHY Right 02/16/2021   Procedure: LOWER  EXTREMITY ANGIOGRAPHY;  Surgeon: Algernon Huxley, MD;  Location: [place] [facility] LAB;  Service: Cardiovascular;  Laterality: Right;     Allergies  Allergies  Allergen Reactions   Shellfish Allergy Anaphylaxis    Throat swelling   Flexeril [Cyclobenzaprine] Hypertension     History of Present Illness    81 year old female with the above complex past medical history including hypertension, hyperlipidemia, diabetes, CAD status post CABG, peripheral arterial disease status post PTA, diastolic dysfunction, PSVT, and recent stroke.  In May 2018, she was evaluated with chest discomfort underwent stress testing, which was abnormal.  Diagnostic catheterization July 2018, showed severe multivessel disease, and she subsequently underwent CABG x3 at [facility].  EF was normal by echo at that time and she had follow-up echo November 2021, that showed an EF of 55 to 60% with grade 2 diastolic dysfunction, RVSP of 30 mmHg, moderate TR, and moderately dilated left atrium.  She is followed by vascular surgery in the setting of claudication and peripheral arterial disease and in May 2022, underwent PTA and drug coated balloon angioplasty of the right peroneal, popliteal, and distal superficial femoral arteries.  She has chronic bilateral lower extremity claudication with abnormal ABIs (right-0.76, left 0.78/TBI's right 0.22, left 0.16) in March 2023, with recommendation for follow-up angiography, at which time patient declined.  In April 2023, Carrie Kane was evaluated emergency department with right-sided weakness, aphasia, tremor, slurred speech, and unsteady gait.  Symptoms lasted about 24 hours and subsequently resolved.  She presented to the emergency department about 2 days later.  CT showed chronic ischemic changes without acute intracranial process.  She left AMA and subsequently followed up with neurology.  Follow-up MRI showed punctate subacute infarct versus artifact-posterior limb of left internal capsule, with chronic lacunar infarcts in the right caudate nucleus, right thalamus, and left pons.  Moderate chronic small vessel ischemic changes within the cerebral white matter, subcentimeter chronic infarct of the left cerebellar hemisphere, and severe proximal disease in the P2  segment of the right PCA with moderate disease in the A1 segment of the right anterior cerebral artery were also noted.  We saw her in clinic in May and placed an event monitor which did not show any evidence of atrial fibrillation or flutter, though she did have 37 runs of PSVT/atrial tachycardia, which were not symptomatic, and she was conservatively managed.  Carrie Kane was in her usual state of health on July 24, when she was walking across her lawn and upon transitioning from grass to concrete, she lost her balance and fell onto her left side associated with severe left hip/leg pain.  She denies any presyncope or syncope before or after the event.  She presented to the emergency department where head CT showed small vessel ischemic changes without acute intracranial abnormalities or traumatic injury to the cervical spine.  Hip films showed acute superiorly displaced left femoral neck fracture.  She was admitted and is currently pending orthopedic evaluation.  We have been asked to evaluate for perioperative cardiovascular risk.  Patient notes that she is reasonably active at home and is able to walk about 30 yards before experiencing dyspnea.  Today, she declines any recent history of claudication or chest pain.  She is able to walk up a flight of stairs without symptoms or limitations.  She does housework requiring moderate amounts of exertion such as sweeping, mopping, and vacuuming and will develop dyspnea with prolonged work, though symptoms resolve quickly with rest.  She denies any recent  palpitations, presyncope, syncope, PND, orthopnea, or early satiety.  Inpatient Medications     aspirin  81 mg Oral Daily   atorvastatin  40 mg Oral Daily   insulin aspart  0-9 Units Subcutaneous Q4H   irbesartan  37.5 mg Oral Daily   levothyroxine  100 mcg Oral Q0600    Family History    Family History  Problem Relation Age of Onset   Breast cancer Maternal Aunt    She indicated that her mother is  deceased. She indicated that her father is deceased. She indicated that her maternal aunt is deceased.   Social History    Social History   Socioeconomic History   Marital status: Married    Spouse name: [name]    Number of children: 1   Years of education: Not on file   Highest education level: Not on file  Occupational History   Not on file  Tobacco Use   Smoking status: Never   Smokeless tobacco: Never  Substance and Sexual Activity   Alcohol use: No   Drug use: No   Sexual activity: Not on file  Other Topics Concern   Not on file  Social History Narrative   Lives in [place] with her husband.  She is responsible for driving and grocery shopping.   Social Determinants of Health   Financial Resource Strain: Low Risk  (10/30/2021)   Overall Financial Resource Strain (CARDIA)    Difficulty of Paying Living Expenses: Not hard at all  Food Insecurity: No Food Insecurity (10/30/2021)   Hunger Vital Sign    Worried About Running Out of Food in the Last Year: Never true    Ran Out of Food in the Last Year: Never true  Transportation Needs: No Transportation Needs (10/30/2021)   PRAPARE - Hydrologist (Medical): No    Lack of Transportation (Non-Medical): No  Physical Activity: Sufficiently Active (10/30/2021)   Exercise Vital Sign    Days of Exercise per Week: 4 days    Minutes of Exercise per Session: 40 min  Stress: No Stress Concern Present (10/30/2021)   [place]    Feeling of Stress : Not at all  Social Connections: [place] (10/30/2021)   Social Connection and Isolation Panel [NHANES]    Frequency of Communication with Friends and Family: More than three times a week    Frequency of Social Gatherings with Friends and Family: More than three times a week    Attends Religious Services: More than 4 times per year    Active Member of [employer] or Organizations: Yes     Attends Music therapist: More than 4 times per year    Marital Status: Married  Human resources officer Violence: Not At Risk (10/30/2021)   Humiliation, Afraid, Rape, and Kick questionnaire    Fear of Current or Ex-Partner: No    Emotionally Abused: No    Physically Abused: No    Sexually Abused: No     Review of Systems    General:  No chills, fever, night sweats or weight changes.  Cardiovascular:  No chest pain, +++ chronic/stable dyspnea on exertion, +++ mild bilat ankle edema, no orthopnea, palpitations, paroxysmal nocturnal dyspnea.  +++ h/o LE claudication - denies today. Dermatological: No rash, lesions/masses Respiratory: No cough, +++ chronic/stable dyspnea Urologic: No hematuria, dysuria Abdominal:   No nausea, vomiting, diarrhea, bright red blood per rectum, melena, or hematemesis Neurologic:  No visual changes,  wkns, changes in mental status. All other systems reviewed and are otherwise negative except as noted above.  Physical Exam    Blood pressure (!) 163/60, pulse 70, temperature 98.2 F (36.8 C), temperature source Oral, resp. rate 18, height '5\' 8"'$  (1.727 m), weight 69.9 kg, SpO2 100 %.  General: Pleasant, NAD Psych: Normal affect. Neuro: Alert and oriented X 3. Moves all extremities spontaneously. HEENT: Normal  Neck: Supple without bruits or JVD. Lungs:  Resp regular and unlabored, CTA. Heart: RRR 2/6 syst murmur @ the upper sternal borders, no s3, s4, or murmurs. Abdomen: Soft, non-tender, non-distended, BS + x 4.  Extremities: No clubbing, cyanosis or edema. DP/PT1+, Radials 2+ and equal bilaterally.  Labs   Lab Results  Component Value Date   WBC 4.8 05/10/2022   HGB 10.1 (L) 05/10/2022   HCT 31.2 (L) 05/10/2022   MCV 89.9 05/10/2022   PLT 207 05/10/2022    Recent Labs  Lab 05/10/22 2138  NA 136  K 3.6  CL 102  CO2 26  BUN 16  CREATININE 0.78  CALCIUM 9.6  PROT 6.4*  BILITOT 0.7  ALKPHOS 53  ALT 18  AST 28  GLUCOSE 128*   Lab  Results  Component Value Date   CHOL 150 08/25/2021   HDL 74 08/25/2021   LDLCALC 62 08/25/2021   TRIG 71 08/25/2021   Lab Results  Component Value Date   HGBA1C 8.8 (H) 05/10/2022      Radiology Studies    CT HEAD WO CONTRAST (5MM)  Result Date: 05/11/2022 CLINICAL DATA:  Fall EXAM: CT HEAD WITHOUT CONTRAST CT CERVICAL SPINE WITHOUT CONTRAST TECHNIQUE: Multidetector CT imaging of the head and cervical spine was performed following the standard protocol without intravenous contrast. Multiplanar CT image reconstructions of the cervical spine were also generated. RADIATION DOSE REDUCTION: This exam was performed according to the departmental dose-optimization program which includes automated exposure control, adjustment of the mA and/or kV according to patient size and/or use of iterative reconstruction technique. COMPARISON:  MR head/neck dated 02/24/2022 FINDINGS: CT HEAD FINDINGS Brain: No evidence of acute infarction, hemorrhage, hydrocephalus, extra-axial collection or mass lesion/mass effect. Subcortical white matter and periventricular small vessel ischemic changes. Vascular: Intracranial atherosclerosis. Skull: Normal. Negative for fracture or focal lesion. Sinuses/Orbits: The visualized paranasal sinuses are essentially clear. The mastoid air cells are unopacified. Other: None. CT CERVICAL SPINE FINDINGS Alignment: Reversal of the normal cervical lordosis, likely positional. Skull base and vertebrae: No acute fracture. No primary bone lesion or focal pathologic process. Soft tissues and spinal canal: No prevertebral fluid or swelling. No visible canal hematoma. Disc levels: Mild degenerative changes of the mid cervical spine. Spinal canal is patent. Upper chest: Visualized lung apices are clear. Other: Visualized thyroid is unremarkable. IMPRESSION: No evidence of acute intracranial abnormality. Small vessel ischemic changes. No evidence of acute traumatic injury to the cervical spine. Mild  degenerative changes. Electronically Signed   By: Julian Hy M.D.   On: 05/11/2022 01:34   CT CERVICAL SPINE WO CONTRAST  Result Date: 05/11/2022 CLINICAL DATA:  Fall EXAM: CT HEAD WITHOUT CONTRAST CT CERVICAL SPINE WITHOUT CONTRAST TECHNIQUE: Multidetector CT imaging of the head and cervical spine was performed following the standard protocol without intravenous contrast. Multiplanar CT image reconstructions of the cervical spine were also generated. RADIATION DOSE REDUCTION: This exam was performed according to the departmental dose-optimization program which includes automated exposure control, adjustment of the mA and/or kV according to patient size and/or use of iterative reconstruction  technique. COMPARISON:  MR head/neck dated 02/24/2022 FINDINGS: CT HEAD FINDINGS Brain: No evidence of acute infarction, hemorrhage, hydrocephalus, extra-axial collection or mass lesion/mass effect. Subcortical white matter and periventricular small vessel ischemic changes. Vascular: Intracranial atherosclerosis. Skull: Normal. Negative for fracture or focal lesion. Sinuses/Orbits: The visualized paranasal sinuses are essentially clear. The mastoid air cells are unopacified. Other: None. CT CERVICAL SPINE FINDINGS Alignment: Reversal of the normal cervical lordosis, likely positional. Skull base and vertebrae: No acute fracture. No primary bone lesion or focal pathologic process. Soft tissues and spinal canal: No prevertebral fluid or swelling. No visible canal hematoma. Disc levels: Mild degenerative changes of the mid cervical spine. Spinal canal is patent. Upper chest: Visualized lung apices are clear. Other: Visualized thyroid is unremarkable. IMPRESSION: No evidence of acute intracranial abnormality. Small vessel ischemic changes. No evidence of acute traumatic injury to the cervical spine. Mild degenerative changes. Electronically Signed   By: Julian Hy M.D.   On: 05/11/2022 01:34   DG HIP UNILAT WITH  PELVIS 2-3 VIEWS LEFT  Result Date: 05/10/2022 CLINICAL DATA:  [id number].  Fall bilateral hip pain. EXAM: DG HIP (WITH OR WITHOUT PELVIS) 2-3V LEFT; DG HIP (WITH OR WITHOUT PELVIS) 2-3V RIGHT COMPARISON:  None Available. FINDINGS: Acute superiorly displaced left femoral neck fracture. No left hip dislocation. There is no evidence of hip fracture or dislocation right hip. No acute displaced fracture or diastasis of the bones of the pelvis. There is no evidence of severe arthropathy or other focal bone abnormality. Vascular calcifications. IMPRESSION: 1. Acute superiorly displaced left femoral neck fracture. 2. No acute displaced fracture or dislocation of the right hip. 3. No acute displaced fracture or diastasis of the bones of the pelvis. Electronically Signed   By: Iven Finn M.D.   On: 05/10/2022 22:17   DG HIP UNILAT WITH PELVIS 2-3 VIEWS RIGHT  Result Date: 05/10/2022 CLINICAL DATA:  [id number].  Fall bilateral hip pain. EXAM: DG HIP (WITH OR WITHOUT PELVIS) 2-3V LEFT; DG HIP (WITH OR WITHOUT PELVIS) 2-3V RIGHT COMPARISON:  None Available. FINDINGS: Acute superiorly displaced left femoral neck fracture. No left hip dislocation. There is no evidence of hip fracture or dislocation right hip. No acute displaced fracture or diastasis of the bones of the pelvis. There is no evidence of severe arthropathy or other focal bone abnormality. Vascular calcifications. IMPRESSION: 1. Acute superiorly displaced left femoral neck fracture. 2. No acute displaced fracture or dislocation of the right hip. 3. No acute displaced fracture or diastasis of the bones of the pelvis. Electronically Signed   By: Iven Finn M.D.   On: 05/10/2022 22:17   DG Chest 1 View  Result Date: 05/10/2022 CLINICAL DATA:  Bilateral hip pain after a fall. EXAM: CHEST  1 VIEW COMPARISON:  06/15/2017 FINDINGS: Postoperative changes in the mediastinum. Normal heart size and pulmonary vascularity. No focal airspace disease or consolidation in  the lungs. No blunting of costophrenic angles. No pneumothorax. Mediastinal contours appear intact. IMPRESSION: No active disease. Electronically Signed   By: Lucienne Capers M.D.   On: 05/10/2022 22:15    ECG & Cardiac Imaging    RSR, 72, LAD, LAFB, RBBB, LVH, no acute ST/T changes. - personally reviewed.  Assessment & Plan    1.  CAD/Preoperative Cardiovascular Risk Assessment:  pt w/ h/o CAD s/p CABG x 3 in 2018.  Nl EF by echo in 08/2020.  She has done reasonably well over the years w/o chest pain.  She presented to the ED 7/25 following  a mechanical fall and suffered a L femoral neck fracture.  She denies any recent chest pain.  B/c of her CAD/Stroke/DM history, her risk of a major perioperative cardiac event calculates to 11%.She does have chronic, stable DOE after walking about 30 yds, though she is able to perform moderate-level housework and climb a flight of stairs w/o significant symptoms (max achievable METS = 5.5).  In that setting, she may proceed to the OR w/o further preoperative ischemic/CV testing.  From our standpoint, plavix may be held preoperatively, w/ plan to resume post-op, esp in setting of stroke in 01/2022 and PAD.  We do recommend continuation of asa and statin rx throughout the perioperative period.  2.  Essential hypertension: Blood pressure currently elevated.  Resume home doses of ARB and clonidine therapy.  3.  Hyperlipidemia: LDL at goal, at 30 in November 2022.  Continue statin therapy.  4.  Peripheral arterial disease: Status post right lower extremity intervention in May 2022.  She reported claudication symptoms in March with stable, abnormal ABIs at that time and recommendation for angiography, which she deferred.  Today, she denies any recent claudication.  Continue aspirin and statin therapy.  Plavix currently on hold.  Follow-up with vascular surgery as an outpatient.  5.  Type 2 diabetes mellitus: A1c 8.8.  On insulin, ARB, and statin therapy as an  outpatient.  Consider SGLT2 inhibitor if not cost prohibitive.  6.  Stroke: This occurred in April 2023 and she subsequently followed up with neurology.  No A-fib/flutter on prior outpatient monitoring.  Continue aspirin and statin.  Plavix on hold.  7.  PSVT: Previously noted on event monitoring.  Asymptomatic.  Brief run on telemetry overnight.  Follow.  8.  Normocytic anemia:  H/H down from prior baseline @ 10.1/31.2.  Follow periop.  Risk Assessment/Risk Scores:           Signed, Murray Hodgkins, NP 05/11/2022, 8:08 AM  For questions or updates, please contact   Please consult [email or web address] for contact info under Cardiology/STEMI.

## 2022-05-11 NOTE — Assessment & Plan Note (Addendum)
History of CAD s/p CABG 2018, PAD s/p revascularization, recent CVA in May 2023 Last saw her cardiologist in May 2023 soon after her stroke and according to chart review was found to be doing well from a cardiovascular standpoint Seen by vascular March 2023 and was told she needed revascularization CT head nonacute  Given vascular issues will consult cardiology for clearance

## 2022-05-11 NOTE — H&P (Signed)
History and Physical    Patient: Carrie Kane JSE:[id number] DOB: November 06, 1940 DOA: 05/10/2022 DOS: the patient was seen and examined on 05/11/2022 PCP: Cletis Athens, MD  Patient coming from: Home  Chief Complaint:  Chief Complaint  Patient presents with   Fall    HPI: Carrie Kane is a 81 y.o. female with medical history significant for CAD status post coronary artery bypass grafting in 2018, stroke in May 2023 without residual deficits, hypertension, hyperlipidemia, insulin-dependent diabetes, peripheral arterial disease s/p revascularization 2022 with recent recommendation for repeat procedure due to intermittent claudication, and diastolic dysfunction, who presents to the ED following a fall from a standing position.  She denies preceding headache, visual disturbance, one-sided numbness tingling or weakness in the face or extremities and denied preceding lightheadedness, palpitations, chest pain or shortness of breath. ED course and data review: BP 174/66 with otherwise normal vitals Labs including CBC and CMP significant for hemoglobin of 10.1 which is down from a baseline of 12 EKG, personally viewed and interpreted with sinus rhythm at 72 with RBBB and LAFB Imaging: Displaced left femoral neck fracture on x-ray hip Chest x-ray no active disease CT head pending  The ED provider spoke with on-call orthopedic surgeon Dr. Sharlet Salina who would like to operate on patient in the a.m. Hospitalist consulted for admission.     Past Medical History:  Diagnosis Date   CAD (coronary artery disease)    a. 04/2017 Cath: LM 40, LAD 80p, 37m LCX 95ost, 733mEF 45-50%; b. 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   Diastolic dysfunction    a. 04/2017 Echo: EF 55-60%, no rwma, Gr1 DD, mildly dil LA; b. 08/2020 Echo: EF 55-60%, no rwma, GrII DD, nl RV fxn, RVSP 3076m, mod dil LA, mildly dil RA, Mod TR.   Hyperlipidemia    Hypertensive heart disease    Insulin dependent diabetes mellitus     [place]peripheral artery disease) ([place]  a. 02/2021 PTA/DCBA R Peroneal, R Popliteal, distal R SFA.   [facility]  a. 01/2022 R sided wkns/aphasia/tremor/slurred speech-->Ss resolved; b. 02/2022 MRI/A: punctate subacute inf vs artifact-post limb of L int capsule. 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 Mod, chronic small vessel isch changes within the cerebral white matter.  Subcm chronic infarct- L cerebellar hemisphere.  Sev dzs prox P2 seg of R PCA. Mod dzs A1 R antClinical cytogeneticist Past Surgical History:  Procedure Laterality Date   ABDOMINAL HYSTERECTOMY     CATARACT EXTRACTION W/PHACO Right 10/26/2021   Procedure: CATARACT EXTRACTION PHACO AND INTRAOCULAR LENS PLACEMENT (IOC) RIGHT DIABETIC 16.27 01:23.8;  Surgeon: KinEulogio BearD;  [place] [facility] [place];  Laterality: Right;  Please leave arrival at 8:00   CATARACT EXTRACTION W/PHACO Left 11/09/2021   Procedure: CATARACT EXTRACTION PHACO AND INTRAOCULAR LENS PLACEMENT (IOCBentonEFT DIABETIC;  Surgeon: KinEulogio BearD;  [place] [facility];  Laterality: Left;  13.39 01:13.0   CORONARY ARTERY BYPASS GRAFT N/A 05/09/2017   Procedure: CORONARY ARTERY BYPASS GRAFTING (CABG) x 3 using left internal mammary artery and right greater saphenous vein harvested endoscopically;  Surgeon: VanIvin PootD;  [facility] [place] [facility];  Laterality: N/A;   INTRAOPERATIVE TRANSESOPHAGEAL ECHOCARDIOGRAM N/A 05/09/2017   Procedure: INTRAOPERATIVE TRANSESOPHAGEAL ECHOCARDIOGRAM;  Surgeon: VanIvin PootD;  [facility] [place];  Laterality: N/A;   IR RADIOLOGIST EVAL & MGMT  12/04/2020  LEFT HEART CATH AND CORONARY ANGIOGRAPHY N/A 05/02/2017   Procedure: Left Heart Cath and Coronary Angiography;  Surgeon: Wellington Hampshire, MD;  Location: [place] [facility];  Service: Cardiovascular;  Laterality: N/A;    LOWER EXTREMITY ANGIOGRAPHY Left 02/09/2021   Procedure: LOWER EXTREMITY ANGIOGRAPHY;  Surgeon: Algernon Huxley, MD;  Location: [place] [facility];  Service: Cardiovascular;  Laterality: Left;   LOWER EXTREMITY ANGIOGRAPHY Right 02/16/2021   Procedure: LOWER EXTREMITY ANGIOGRAPHY;  Surgeon: Algernon Huxley, MD;  Location: [place] [facility];  Service: Cardiovascular;  Laterality: Right;   Social History:  reports that she has never smoked. She has never used smokeless tobacco. She reports that she does not drink alcohol and does not use drugs.  Allergies  Allergen Reactions   Shellfish Allergy Anaphylaxis    Throat swelling   Flexeril [Cyclobenzaprine] Hypertension    Family History  Problem Relation Age of Onset   Breast cancer Maternal Aunt     Prior to Admission medications   Medication Sig Start Date End Date Taking? Authorizing Provider  aspirin EC 81 MG tablet Take 1 tablet (81 mg total) by mouth daily. 08/02/17   Minna Merritts, MD  atorvastatin (LIPITOR) 40 MG tablet Take 1 tablet (40 mg total) by mouth daily. 06/23/21   Minna Merritts, MD  BD INSULIN SYRINGE U/F 30G X 1/2" 0.5 ML MISC  03/29/20   [provider]  Chlorpheniramine Maleate (ALLERGY PO) Take 1 tablet by mouth daily as needed (allergies).    [provider]  cholecalciferol (VITAMIN D) 1000 units tablet Take 2,000 Units by mouth daily at 12 noon.     [provider]  cloNIDine (CATAPRES) 0.2 MG tablet TAKE 1 TABLET TWICE A DAY 01/25/22   Minna Merritts, MD  clopidogrel (PLAVIX) 75 MG tablet Take 1 tablet (75 mg total) by mouth daily. 06/23/21   Minna Merritts, MD  ferrous sulfate 325 (65 FE) MG EC tablet Take 1 tablet (325 mg total) by mouth daily at 2 PM. 12/23/20   Cletis Athens, MD  HUMALOG KWIKPEN 100 UNIT/ML KwikPen INJECT 38 UNITS UNDER THE SKIN DAILY 03/22/22   Cletis Athens, MD  HUMALOG MIX 75/25 KWIKPEN (75-25) 100 UNIT/ML Kwikpen Inject 45 Units into the skin daily at 2 PM. 03/10/21    Cletis Athens, MD  insulin glargine (LANTUS) 100 UNIT/ML injection INJECT 45 UNITS UNDER THE SKIN AT BEDTIME 02/26/21   Beckie Salts, FNP  levothyroxine (SYNTHROID) 100 MCG tablet TAKE 1 TABLET DAILY BEFORE BREAKFAST 03/22/22   Cletis Athens, MD  Multiple Vitamin (MULTIVITAMIN) capsule Take 1 capsule by mouth daily at 12 noon.     [provider]  nitroGLYCERIN (NITROSTAT) 0.4 MG SL tablet Place 1 tablet (0.4 mg total) under the tongue every 5 (five) minutes as needed for chest pain. 06/23/21   Minna Merritts, MD  Polyethyl Glycol-Propyl Glycol (SYSTANE OP) Apply 1 drop to eye as needed (dry eyes).    [provider]  valsartan (DIOVAN) 320 MG tablet Take 1 tablet (320 mg total) by mouth daily. 06/23/21 06/18/22  Minna Merritts, MD    Physical Exam: Vitals:   05/10/22 2141 05/10/22 2300 05/10/22 2305  BP: (!) 174/66 (!) 183/77 (!) 196/64  Pulse: 70 76 73  Resp: '14 13 15  '$ Temp: 98.3 F (36.8 C)    TempSrc: Oral    SpO2: 100% 100% 100%  Weight: 69.9 kg    Height: '5\' 8"'$  (1.727 m)  Physical Exam Vitals and nursing note reviewed.  Constitutional:      General: She is not in acute distress. HENT:     Head: Normocephalic and atraumatic.  Cardiovascular:     Rate and Rhythm: Normal rate and regular rhythm.     Heart sounds: Normal heart sounds.  Pulmonary:     Effort: Pulmonary effort is normal.     Breath sounds: Normal breath sounds.  Abdominal:     Palpations: Abdomen is soft.     Tenderness: There is no abdominal tenderness.  Neurological:     Mental Status: Mental status is at baseline.     Labs on Admission: I have personally reviewed following labs and imaging studies  CBC: Recent Labs  Lab 05/10/22 2138  WBC 4.8  HGB 10.1*  HCT 31.2*  MCV 89.9  PLT 465   Basic Metabolic Panel: Recent Labs  Lab 05/10/22 2138  NA 136  K 3.6  CL 102  CO2 26  GLUCOSE 128*  BUN 16  CREATININE 0.78  CALCIUM 9.6   GFR: Estimated Creatinine Clearance:  57.5 mL/min (by C-G formula based on SCr of 0.78 mg/dL). Liver Function Tests: Recent Labs  Lab 05/10/22 2138  AST 28  ALT 18  ALKPHOS 53  BILITOT 0.7  PROT 6.4*  ALBUMIN 3.8   No results for input(s): "LIPASE", "AMYLASE" in the last 168 hours. No results for input(s): "AMMONIA" in the last 168 hours. Coagulation Profile: Recent Labs  Lab 05/10/22 2138  INR 1.1   Cardiac Enzymes: No results for input(s): "CKTOTAL", "CKMB", "CKMBINDEX", "TROPONINI" in the last 168 hours. BNP (last 3 results) No results for input(s): "PROBNP" in the last 8760 hours. HbA1C: No results for input(s): "HGBA1C" in the last 72 hours. CBG: No results for input(s): "GLUCAP" in the last 168 hours. Lipid Profile: No results for input(s): "CHOL", "HDL", "LDLCALC", "TRIG", "CHOLHDL", "LDLDIRECT" in the last 72 hours. Thyroid Function Tests: No results for input(s): "TSH", "T4TOTAL", "FREET4", "T3FREE", "THYROIDAB" in the last 72 hours. Anemia Panel: No results for input(s): "VITAMINB12", "FOLATE", "FERRITIN", "TIBC", "IRON", "RETICCTPCT" in the last 72 hours. Urine analysis:    Component Value Date/Time   COLORURINE STRAW (A) 05/03/2017 1152   APPEARANCEUR CLEAR 05/03/2017 1152   LABSPEC 1.011 05/03/2017 1152   PHURINE 6.0 05/03/2017 1152   GLUCOSEU >=500 (A) 05/03/2017 1152   HGBUR NEGATIVE 05/03/2017 1152   BILIRUBINUR NEGATIVE 05/03/2017 1152   KETONESUR NEGATIVE 05/03/2017 1152   PROTEINUR NEGATIVE 05/03/2017 1152   NITRITE NEGATIVE 05/03/2017 1152   LEUKOCYTESUR LARGE (A) 05/03/2017 1152    Radiological Exams on Admission: DG HIP UNILAT WITH PELVIS 2-3 VIEWS LEFT  Result Date: 05/10/2022 CLINICAL DATA:  [id number].  Fall bilateral hip pain. EXAM: DG HIP (WITH OR WITHOUT PELVIS) 2-3V LEFT; DG HIP (WITH OR WITHOUT PELVIS) 2-3V RIGHT COMPARISON:  None Available. FINDINGS: Acute superiorly displaced left femoral neck fracture. No left hip dislocation. There is no evidence of hip fracture or  dislocation right hip. No acute displaced fracture or diastasis of the bones of the pelvis. There is no evidence of severe arthropathy or other focal bone abnormality. Vascular calcifications. IMPRESSION: 1. Acute superiorly displaced left femoral neck fracture. 2. No acute displaced fracture or dislocation of the right hip. 3. No acute displaced fracture or diastasis of the bones of the pelvis. Electronically Signed   By: Iven Finn M.D.   On: 05/10/2022 22:17   DG HIP UNILAT WITH PELVIS 2-3 VIEWS RIGHT  Result Date: 05/10/2022  CLINICAL DATA:  [id number].  Fall bilateral hip pain. EXAM: DG HIP (WITH OR WITHOUT PELVIS) 2-3V LEFT; DG HIP (WITH OR WITHOUT PELVIS) 2-3V RIGHT COMPARISON:  None Available. FINDINGS: Acute superiorly displaced left femoral neck fracture. No left hip dislocation. There is no evidence of hip fracture or dislocation right hip. No acute displaced fracture or diastasis of the bones of the pelvis. There is no evidence of severe arthropathy or other focal bone abnormality. Vascular calcifications. IMPRESSION: 1. Acute superiorly displaced left femoral neck fracture. 2. No acute displaced fracture or dislocation of the right hip. 3. No acute displaced fracture or diastasis of the bones of the pelvis. Electronically Signed   By: Iven Finn M.D.   On: 05/10/2022 22:17   DG Chest 1 View  Result Date: 05/10/2022 CLINICAL DATA:  Bilateral hip pain after a fall. EXAM: CHEST  1 VIEW COMPARISON:  06/15/2017 FINDINGS: Postoperative changes in the mediastinum. Normal heart size and pulmonary vascularity. No focal airspace disease or consolidation in the lungs. No blunting of costophrenic angles. No pneumothorax. Mediastinal contours appear intact. IMPRESSION: No active disease. Electronically Signed   By: Lucienne Capers M.D.   On: 05/10/2022 22:15     Data Reviewed: Relevant notes from primary care and specialist visits, past discharge summaries as available in EHR, including Care  Everywhere. Prior diagnostic testing as pertinent to current admission diagnoses Updated medications and problem lists for reconciliation ED course, including vitals, labs, imaging, treatment and response to treatment Triage notes, nursing and pharmacy notes and ED provider's notes Notable results as noted in HPI   Assessment and Plan: * Left displaced femoral neck fracture ([place]) Orthopedist consulted from the ED and will perform surgery in the a.m. Will hold Plavix Pain control N.p.o. from midnight  Fall at home, initial encounter Patient suffered a fall which appears to be accidental as she had no preceding symptoms No focal neurologic deficits on exam CT head negative  Preoperative clearance History of CAD s/p CABG 2018, PAD s/p revascularization, recent CVA in May 2023 Last saw her cardiologist in May 2023 soon after her stroke and according to chart review was found to be doing well from a cardiovascular standpoint Seen by vascular March 2023 and was told she needed revascularization CT head nonacute  Given vascular issues will consult cardiology for clearance  Essential hypertension BP elevated likely related to pain Pain controlled Hydralazine IV as needed while n.p.o.  Hypothyroidism Levothyroxine to resume postoperatively  Anemia Hemoglobin 10, down from 12 a couple months prior No evidence of acute bleeding  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) ([place]) Patient is status post revascularization in 2022 Saw her vascular doctor in March 2023 with the following assessment "evidence of severe atherosclerotic changes of both lower extremities with rest pain ...should undergo angiography of the lower extremities with the hope for intervention for limb salvage.. Patient does not yet wish to proceed at this time but will contact her office when she is ready to move forward"  Aspirin and atorvastatin to continue.  CAD with Hx of CABG No complaints of  chest pain and EKG nonacute.  We will get a baseline troponin Can resume aspirin, atorvastatin postoperatively Nitroglycerin sublingual as needed chest pain   Type 2 diabetes mellitus without complication, with long-term current use of insulin (HCC) Sliding scale insulin coverage        DVT prophylaxis: SCDs  Consults: Orthopedics, Dr. Sharlet Salina, [facility] cardiology, Dr. Davina Poke  Advance Care Planning:   Code Status: Full  Code   Family Communication: none  Disposition Plan: Back to previous home environment  Severity of Illness: The appropriate patient status for this patient is INPATIENT. Inpatient status is judged to be reasonable and necessary in order to provide the required intensity of service to ensure the patient's safety. The patient's presenting symptoms, physical exam findings, and initial radiographic and laboratory data in the context of their chronic comorbidities is felt to place them at high risk for further clinical deterioration. Furthermore, it is not anticipated that the patient will be medically stable for discharge from the hospital within 2 midnights of admission.   * I certify that at the point of admission it is my clinical judgment that the patient will require inpatient hospital care spanning beyond 2 midnights from the point of admission due to high intensity of service, high risk for further deterioration and high frequency of surveillance required.*  Author: Athena Masse, MD 05/11/2022 12:24 AM  For on call review [email or web address].

## 2022-05-11 NOTE — Anesthesia Procedure Notes (Signed)
Procedure Name: Intubation Date/Time: 05/11/2022 3:20 PM  Performed by: Nelda Marseille, CRNAPre-anesthesia Checklist: Patient identified, Patient being monitored, Timeout performed, Emergency Drugs available and Suction available Patient Re-evaluated:Patient Re-evaluated prior to induction Oxygen Delivery Method: Circle system utilized Preoxygenation: Pre-oxygenation with 100% oxygen Induction Type: IV induction and Rapid sequence Ventilation: Mask ventilation without difficulty Laryngoscope Size: Mac, 3 and McGraph Grade View: Grade I Tube type: Oral Tube size: 7.0 mm Number of attempts: 1 Airway Equipment and Method: Stylet Placement Confirmation: ETT inserted through vocal cords under direct vision, positive ETCO2 and breath sounds checked- equal and bilateral Secured at: 21 cm Tube secured with: Tape Dental Injury: Teeth and Oropharynx as per pre-operative assessment

## 2022-05-11 NOTE — Assessment & Plan Note (Addendum)
Patient suffered a fall which appears to be accidental as she had no preceding symptoms No focal neurologic deficits on exam CT head negative

## 2022-05-11 NOTE — Progress Notes (Signed)
Initial Nutrition Assessment  DOCUMENTATION CODES:   Non-severe (moderate) malnutrition in context of chronic illness  INTERVENTION:   -Once diet is advanced, add:   -Ensure Enlive po TID, each supplement provides 350 kcal and 20 grams of protein -MVI with minerals daily  NUTRITION DIAGNOSIS:   Moderate Malnutrition related to chronic illness (CAD) as evidenced by mild fat depletion, mild muscle depletion.  GOAL:   Patient will meet greater than or equal to 90% of their needs  MONITOR:   PO intake, Supplement acceptance, Diet advancement  REASON FOR ASSESSMENT:   Consult Assessment of nutrition requirement/status, Hip fracture protocol  ASSESSMENT:   Pt with medical history significant for CAD status post coronary artery bypass grafting in 2018, stroke in May 2023 without residual deficits, hypertension, hyperlipidemia, insulin-dependent diabetes, peripheral arterial disease s/p revascularization 2022 with recent recommendation for repeat procedure due to intermittent claudication, and diastolic dysfunction, who presents following a fall from a standing position.  Pt admitted with lt displaced femoral neck fracture.   Reviewed I/O's: +1.1 L x 24 hours  Per orthopedics notes, plan for surgery today. Pt is currently NPO for procedure.    Spoke with pt at bedside, who was pleasant and in good spirits today. Pt reports weight loss and decreased appetite since 2018 after having CABG. She shares "my husband says I don't eat enough". Pt generally consumes 2 meals per day, which consist of half sandwich and fruit or cereal and fruit. She recently started drinking Ensure supplements at home for additional nutrition.   Per pt, she has gradually lost from 250# to 155# within the past 5 years. Reviewed wt hx; pt has experienced a 4.2% wt loss over the past 3 months, which is not significant for time frame.   Discussed importance of good meal and supplement intake to promote healing.  Pt amenable to resume Ensure supplements.   Medications reviewed and include dextrose 5%-0.9% sodium chloride infusion @ 75 ml/hr.   Lab Results  Component Value Date   HGBA1C 8.8 (H) 05/10/2022   PTA DM medications are 45 units insulin glargine daily.   Labs reviewed: CBGS: 235-361 (inpatient orders for glycemic control are 0-9 units insulin aspart every 4 hours).    NUTRITION - FOCUSED PHYSICAL EXAM:  Flowsheet Row Most Recent Value  Orbital Region Mild depletion  Upper Arm Region Mild depletion  Thoracic and Lumbar Region Mild depletion  Buccal Region Mild depletion  Temple Region Mild depletion  Clavicle Bone Region No depletion  Clavicle and Acromion Bone Region No depletion  Scapular Bone Region No depletion  Dorsal Hand No depletion  Patellar Region Mild depletion  Anterior Thigh Region Mild depletion  Posterior Calf Region Mild depletion  Edema (RD Assessment) Mild  Hair Reviewed  Eyes Reviewed  Mouth Reviewed  Skin Reviewed  Nails Reviewed       Diet Order:   Diet Order             Diet NPO time specified  Diet effective now                   EDUCATION NEEDS:   Education needs have been addressed  Skin:  Skin Assessment: Reviewed RN Assessment  Last BM:  05/08/22  Height:   Ht Readings from Last 1 Encounters:  05/10/22 '5\' 8"'$  (1.727 m)    Weight:   Wt Readings from Last 1 Encounters:  05/11/22 69.9 kg    Ideal Body Weight:  63.6 kg  BMI:  Body  mass index is 23.43 kg/m.  Estimated Nutritional Needs:   Kcal:  2000-2200  Protein:  110-125 grams  Fluid:  > 2 L    Loistine Chance, RD, LDN, [place] Registered Dietitian II Certified Diabetes Care and Education Specialist Please refer to [facility] for RD and/or RD on-call/weekend/after hours pager

## 2022-05-11 NOTE — Op Note (Signed)
DATE OF SURGERY:  05/11/2022  5:24 PM  PATIENT:  Carrie Kane   MRN: [id number]   PRE-OPERATIVE DIAGNOSIS: Left femoral Neck Fracture   POST-OPERATIVE DIAGNOSIS: Left femoral Neck Fracture   PROCEDURE:  Procedure(s): ARTHROPLASTY BIPOLAR HIP (HEMIARTHROPLASTY)   PREOPERATIVE INDICATIONS:     Carrie Kane  is an 81 y.o. year old who was admitted with a diagnosis of Left femoral Neck Fracture.  I have recommended surgical fixation with hemiarthroplasty for this injury. I have explained the surgery and the postoperative course to the patient and their family who have agreed with surgical management of this fracture.     The risks benefits and alternatives were discussed with the patient and their family including but not limited to the risks of  infection requiring removal of the prosthesis, bleeding requiring blood transfusion, nerve injury especially to the sciatic nerve leading to foot drop or lower extremity numbness, periprosthetic fracture, dislocation leg length discrepancy, change in lower extremity rotation persistent hip pain, loosening or failure of the components and the need for revision surgery. Medical risks include but are not limited to DVT and pulmonary embolism, myocardial infarction, stroke, pneumonia, respiratory failure and death.   OPERATIVE REPORT     SURGEON:  Renee Harder    ANESTHESIA: General  EBL:  643 cc    COMPLICATIONS:  None.    SPECIMEN: Femoral head to pathology    COMPONENTS: [place] size 2 cemented stem,45 mm head    PROCEDURE IN DETAIL:    The patient was met in the holding area and  identified.  The appropriate hip was identified and marked at the operative site after verbally confirming with the patient that this was the correct site of surgery.  The patient was then transported to the OR  and  underwent general anesthesia.  The patient was then placed in the lateral decubitus position with the operative side up and secured on the  operating room table with a pegboard and all bony prominences were adequately padded. This included an axillary roll and additional padding around the nonoperative leg to prevent compression to the common peroneal nerve.    The operative lower extremity was prepped and draped in a sterile fashion.  A time out was performed prior to incision to verify patient's name, date of birth, medical record number, correct site of surgery correct procedure to be performed. The timeout was also used to verify the patient received antibiotics now appropriate instruments, implants and radiographic studies were available in the room. Once all in attendance were in agreement case began.    A posterolateral approach was utilized via sharp dissection  carried down to the subcutaneous tissue.  Bleeding vessels were coagulated using electrocautery.  The fascia lata was identified and incised along the length of the skin incision.  The gluteus maximus muscle was then split in line with its fibers. Self-retaining retractors were  inserted.  With the hip internally rotated, the short external rotators  were identified and removed from the posterior attachment from the greater trochanter. The capsule was identified and a T-shaped capsulotomy was performed. The capsule was tagged with #2 Tycron for later repair.   The femoral neck fracture was exposed, and the femoral head was removed using a corkscrew device. This was measured to be 45 mm in diameter. The attention was then turned to proximal femur preparation.  An oscillating saw was used to perform a proximal femoral osteotomy 1 fingerbreadth above the lesser trochanter. The trial 45 mm femoral  head was placed into the acetabulum and had an excellent suction fit. The attention was then turned back to femoral preparation.    A femoral skid and Cobra retractor were placed under the femoral neck to allow for adequate visualization. A box osteotome was used to make the initial entry  into the proximal femur. A single hand reamer was used to prepare the femoral canal. A T-shaped femoral canal sounder was then used to ensure no penetration femoral cortex had occurred during reaming. The proximal femur was then sequentially broached by hand. A size 2 femoral trial broach was found to have best medial to lateral canal fit. Once adequate mediolateral canal fill was achieved the trial femoral broach, neck, and head was assembled and the hip was reduced. It was found to have excellent stability, equivalent leg lengths with functional range of motion. The trial components were then removed.   I copiously irrigated the femoral canal and then cemented the real femoral prosthesis into place into the appropriate version, slightly anteverted to the normal anatomy, and I impacted the real femoral head prosthesis.  The hip was then reduced and taken through functional range of motion and found to have excellent stability. Leg lengths were restored. The hip joint was copiously irrigated.    A soft tissue repair of the capsule was performed using #2 Tycron Excellent posterior capsular repair was achieved. The fascia lata was then closed with interrupted 0 Vicryl suture. The subcutaneous tissues were closed with 2-0 Vicryl and the skin approximated with staples. Sterile dressing was applied.   The patient was then placed supine on the operative table. Leg lengths were checked clinically and found to be equivalent. An abduction pillow was placed between the lower extremities. The patient was then transferred to a hospital bed and brought to the PACU in stable condition. I was scrubbed and present the entire case and all sharp and instrument counts were correct at the conclusion of the case. I spoke with the patient's family to let them know the case was completed without complication patient was stable in recovery room.     Postoperative plan: 1.  Patient will be weightbearing as tolerated with posterior  hip precautions. 2.  DVT prophylaxis can start on postoperative day #1. Recommend Aspirin $RemoveBeforeDEI'325mg'AjYgBNNHLpSlgKym$  BID vs Lovenox $RemoveBe'40mg'AkrdptmTz$  daily vs restarting clopidogrel per hospitalist recommendation 3.  Wound vac dressing was applied given that patient has been on clopidogrel.  Dressing can be transition to portable suction x7 days upon discharge from hospital. 4.  Follow-up in clinic in approximately 2 weeks for staple removal and x-rays   Renee Harder, MD Orthopedic Surgeon

## 2022-05-11 NOTE — Consult Note (Signed)
ORTHOPAEDIC CONSULTATION  REQUESTING PHYSICIAN: Wouk, Ailene Rud, MD  Chief Complaint: Left hip pain  HPI: Carrie Kane is a 81 y.o. female who complains of left hip pain after mechanical fall at home yesterday. The pain is sharp in character. Denies any numbness, tingling or constitutional symptoms.  Patient was seen in the ER overnight and x-rays showed a displaced left femoral neck fracture.  Patient was admitted to the hospitalist service and orthopedics was consulted regarding further management.  Patient lives with her husband.  She ambulates with a cane.  Past medical history significant for CAD status post coronary artery bypass grafting in 2018, stroke in May 2023 without residual deficits, hypertension, hyperlipidemia, insulin-dependent diabetes, peripheral arterial disease s/p revascularization 2022 with recent recommendation for repeat procedure due to intermittent claudication, and diastolic dysfunction.  Past Medical History:  Diagnosis Date   CAD (coronary artery disease)    a. 04/2017 Cath: LM 42, LAD 80p, 29m LCX 95ost, 79mEF 45-50%; b. 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   Diastolic dysfunction    a. 04/2017 Echo: EF 55-60%, no rwma, Gr1 DD, mildly dil LA; b. 08/2020 Echo: EF 55-60%, no rwma, GrII DD, nl RV fxn, RVSP 3055m, mod dil LA, mildly dil RA, Mod TR.   Hyperlipidemia    Hypertensive heart disease    Insulin dependent diabetes mellitus    PAD (peripheral artery disease) ([facility]  a. 02/2021 PTA/DCBA R Peroneal, R Popliteal, distal R SFA.   PSVT (paroxysmal supraventricular tachycardia) ([facility]  a. 02/2022 Zio: Predominantly sinus rhythm @ 61 (36-218). 2 NSVT runs (fastest/longest 6 beats @ 218). 37 SVT/A tach runs (fastest 185 x 5 beats, longest 32.4 secs @ 107). Triggered events = RSR, PAC.   [facility]  [facility]. 01/2022 R sided wkns/aphasia/tremor/slurred speech-->Ss resolved; b. 02/2022 MRI/A: punctate subacute inf vs artifact-post limb of L int  capsule. 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 Mod, chronic small vessel isch changes within the cerebral white matter.  Subcm chronic infarct- L cerebellar hemisphere.  Sev dzs prox P2 seg of R PCA. Mod dzs A1 R antClinical cytogeneticist Past Surgical History:  Procedure Laterality Date   ABDOMINAL HYSTERECTOMY     CATARACT EXTRACTION W/PHACO Right 10/26/2021   Procedure: CATARACT EXTRACTION PHACO AND INTRAOCULAR LENS PLACEMENT (IOC) RIGHT DIABETIC 16.27 01:23.8;  Surgeon: KinEulogio BearD;  [facility];  Laterality: Right;  Please leave arrival at 8:00   CATARACT EXTRACTION W/PHACO Left 11/09/2021   Procedure: CATARACT EXTRACTION PHACO AND INTRAOCULAR LENS PLACEMENT (IOCChristieEFT DIABETIC;  Surgeon: KinEulogio BearD;  [facility];  Laterality: Left;  13.39 01:13.0   CORONARY ARTERY BYPASS GRAFT N/A 05/09/2017   Procedure: CORONARY ARTERY BYPASS GRAFTING (CABG) x 3 using left internal mammary artery and right greater saphenous vein harvested endoscopically;  Surgeon: VanIvin PootD;  [facility];  Laterality: N/A;   INTRAOPERATIVE TRANSESOPHAGEAL ECHOCARDIOGRAM N/A 05/09/2017   Procedure: INTRAOPERATIVE TRANSESOPHAGEAL ECHOCARDIOGRAM;  Surgeon: VanIvin PootD;  [facility];  Laterality: N/A;   IR RADIOLOGIST EVAL & MGMT  12/04/2020   LEFT HEART CATH AND CORONARY ANGIOGRAPHY N/A 05/02/2017   Procedure: Left Heart Cath and Coronary Angiography;  Surgeon: AriWellington HampshireD;  [facility];  Service: Cardiovascular;  Laterality: N/A;   LOWER EXTREMITY ANGIOGRAPHY Left 02/09/2021  Procedure: LOWER EXTREMITY ANGIOGRAPHY;  Surgeon: Algernon Huxley, MD;  Location: [place] [facility];  Service: Cardiovascular;  Laterality: Left;   LOWER EXTREMITY ANGIOGRAPHY Right 02/16/2021   Procedure: LOWER  EXTREMITY ANGIOGRAPHY;  Surgeon: Algernon Huxley, MD;  Location: [place] [facility];  Service: Cardiovascular;  Laterality: Right;   Social History   Socioeconomic History   Marital status: Married    Spouse name: [name]    Number of children: 1   Years of education: Not on file   Highest education level: Not on file  Occupational History   Not on file  Tobacco Use   Smoking status: Never   Smokeless tobacco: Never  Substance and Sexual Activity   Alcohol use: No   Drug use: No   Sexual activity: Not on file  Other Topics Concern   Not on file  Social History Narrative   Lives in [place] with her husband.  She is responsible for driving and grocery shopping.   Social Determinants of Health   Financial Resource Strain: Low Risk  (10/30/2021)   Overall Financial Resource Strain (CARDIA)    Difficulty of Paying Living Expenses: Not hard at all  Food Insecurity: No Food Insecurity (10/30/2021)   Hunger Vital Sign    Worried About Running Out of Food in the Last Year: Never true    Ran Out of Food in the Last Year: Never true  Transportation Needs: No Transportation Needs (10/30/2021)   PRAPARE - Hydrologist (Medical): No    Lack of Transportation (Non-Medical): No  Physical Activity: Sufficiently Active (10/30/2021)   Exercise Vital Sign    Days of Exercise per Week: 4 days    Minutes of Exercise per Session: 40 min  Stress: No Stress Concern Present (10/30/2021)   [employer]    Feeling of Stress : Not at all  Social Connections: [place] (10/30/2021)   Social Connection and Isolation Panel [NHANES]    Frequency of Communication with Friends and Family: More than three times a week    Frequency of Social Gatherings with Friends and Family: More than three times a week    Attends Religious Services: More than 4 times per year    Active Member of [employer] or Organizations: Yes     Attends Music therapist: More than 4 times per year    Marital Status: Married   Family History  Problem Relation Age of Onset   Breast cancer Maternal Aunt    Allergies  Allergen Reactions   Shellfish Allergy Anaphylaxis    Throat swelling   Flexeril [Cyclobenzaprine] Hypertension   Prior to Admission medications   Medication Sig Start Date End Date Taking? Authorizing Provider  aspirin EC 81 MG tablet Take 81 mg by mouth 2 (two) times daily. 08/02/17  Yes Minna Merritts, MD  cholecalciferol (VITAMIN D) 1000 units tablet Take 2,000 Units by mouth daily at 12 noon.    Yes [provider]  cloNIDine (CATAPRES) 0.2 MG tablet TAKE 1 TABLET TWICE A DAY 01/25/22  Yes Gollan, Kathlene November, MD  clopidogrel (PLAVIX) 75 MG tablet Take 1 tablet (75 mg total) by mouth daily. 06/23/21  Yes Minna Merritts, MD  ferrous sulfate 325 (65 FE) MG EC tablet Take 1 tablet (325 mg total) by mouth daily at 2 PM. 12/23/20  Yes Masoud, Viann Shove, MD  HUMALOG KWIKPEN 100 UNIT/ML KwikPen INJECT 38 UNITS UNDER THE SKIN  DAILY Patient taking differently: 10-12 Units in the morning and at bedtime. 10-12 units BID prn 03/22/22  Yes Masoud, Viann Shove, MD  hydrochlorothiazide (MICROZIDE) 12.5 MG capsule Take 12.5 mg by mouth daily.   Yes [provider]  insulin glargine (LANTUS) 100 UNIT/ML injection INJECT 45 UNITS UNDER THE SKIN AT BEDTIME Patient taking differently: 20 Units at bedtime. Pt takes 20 units depending on blood sugar level 02/26/21  Yes Beckie Salts, FNP  levothyroxine (SYNTHROID) 100 MCG tablet TAKE 1 TABLET DAILY BEFORE BREAKFAST 03/22/22  Yes Masoud, Viann Shove, MD  Multiple Vitamin (MULTIVITAMIN) capsule Take 1 capsule by mouth daily at 12 noon.    Yes [provider]  Polyethyl Glycol-Propyl Glycol (SYSTANE OP) Apply 1 drop to eye as needed (dry eyes).   Yes [provider]  valsartan (DIOVAN) 320 MG tablet Take 1 tablet (320 mg total) by mouth daily. 06/23/21 06/18/22  Yes Gollan, Kathlene November, MD  atorvastatin (LIPITOR) 40 MG tablet Take 1 tablet (40 mg total) by mouth daily. 06/23/21   Minna Merritts, MD  BD INSULIN SYRINGE U/F 30G X 1/2" 0.5 ML MISC  03/29/20   [provider]  HUMALOG MIX 75/25 KWIKPEN (75-25) 100 UNIT/ML Kwikpen Inject 45 Units into the skin daily at 2 PM. Patient not taking: Reported on 05/11/2022 03/10/21   Cletis Athens, MD  nitroGLYCERIN (NITROSTAT) 0.4 MG SL tablet Place 1 tablet (0.4 mg total) under the tongue every 5 (five) minutes as needed for chest pain. 06/23/21   Minna Merritts, MD   CT HEAD WO CONTRAST (5MM)  Result Date: 05/11/2022 CLINICAL DATA:  Fall EXAM: CT HEAD WITHOUT CONTRAST CT CERVICAL SPINE WITHOUT CONTRAST TECHNIQUE: Multidetector CT imaging of the head and cervical spine was performed following the standard protocol without intravenous contrast. Multiplanar CT image reconstructions of the cervical spine were also generated. RADIATION DOSE REDUCTION: This exam was performed according to the departmental dose-optimization program which includes automated exposure control, adjustment of the mA and/or kV according to patient size and/or use of iterative reconstruction technique. COMPARISON:  MR head/neck dated 02/24/2022 FINDINGS: CT HEAD FINDINGS Brain: No evidence of acute infarction, hemorrhage, hydrocephalus, extra-axial collection or mass lesion/mass effect. Subcortical white matter and periventricular small vessel ischemic changes. Vascular: Intracranial atherosclerosis. Skull: Normal. Negative for fracture or focal lesion. Sinuses/Orbits: The visualized paranasal sinuses are essentially clear. The mastoid air cells are unopacified. Other: None. CT CERVICAL SPINE FINDINGS Alignment: Reversal of the normal cervical lordosis, likely positional. Skull base and vertebrae: No acute fracture. No primary bone lesion or focal pathologic process. Soft tissues and spinal canal: No prevertebral fluid or swelling. No visible  canal hematoma. Disc levels: Mild degenerative changes of the mid cervical spine. Spinal canal is patent. Upper chest: Visualized lung apices are clear. Other: Visualized thyroid is unremarkable. IMPRESSION: No evidence of acute intracranial abnormality. Small vessel ischemic changes. No evidence of acute traumatic injury to the cervical spine. Mild degenerative changes. Electronically Signed   By: Julian Hy M.D.   On: 05/11/2022 01:34   CT CERVICAL SPINE WO CONTRAST  Result Date: 05/11/2022 CLINICAL DATA:  Fall EXAM: CT HEAD WITHOUT CONTRAST CT CERVICAL SPINE WITHOUT CONTRAST TECHNIQUE: Multidetector CT imaging of the head and cervical spine was performed following the standard protocol without intravenous contrast. Multiplanar CT image reconstructions of the cervical spine were also generated. RADIATION DOSE REDUCTION: This exam was performed according to the departmental dose-optimization program which includes automated exposure control, adjustment of the mA and/or kV according to  patient size and/or use of iterative reconstruction technique. COMPARISON:  MR head/neck dated 02/24/2022 FINDINGS: CT HEAD FINDINGS Brain: No evidence of acute infarction, hemorrhage, hydrocephalus, extra-axial collection or mass lesion/mass effect. Subcortical white matter and periventricular small vessel ischemic changes. Vascular: Intracranial atherosclerosis. Skull: Normal. Negative for fracture or focal lesion. Sinuses/Orbits: The visualized paranasal sinuses are essentially clear. The mastoid air cells are unopacified. Other: None. CT CERVICAL SPINE FINDINGS Alignment: Reversal of the normal cervical lordosis, likely positional. Skull base and vertebrae: No acute fracture. No primary bone lesion or focal pathologic process. Soft tissues and spinal canal: No prevertebral fluid or swelling. No visible canal hematoma. Disc levels: Mild degenerative changes of the mid cervical spine. Spinal canal is patent. Upper chest:  Visualized lung apices are clear. Other: Visualized thyroid is unremarkable. IMPRESSION: No evidence of acute intracranial abnormality. Small vessel ischemic changes. No evidence of acute traumatic injury to the cervical spine. Mild degenerative changes. Electronically Signed   By: Julian Hy M.D.   On: 05/11/2022 01:34   DG HIP UNILAT WITH PELVIS 2-3 VIEWS LEFT  Result Date: 05/10/2022 CLINICAL DATA:  [id number].  Fall bilateral hip pain. EXAM: DG HIP (WITH OR WITHOUT PELVIS) 2-3V LEFT; DG HIP (WITH OR WITHOUT PELVIS) 2-3V RIGHT COMPARISON:  None Available. FINDINGS: Acute superiorly displaced left femoral neck fracture. No left hip dislocation. There is no evidence of hip fracture or dislocation right hip. No acute displaced fracture or diastasis of the bones of the pelvis. There is no evidence of severe arthropathy or other focal bone abnormality. Vascular calcifications. IMPRESSION: 1. Acute superiorly displaced left femoral neck fracture. 2. No acute displaced fracture or dislocation of the right hip. 3. No acute displaced fracture or diastasis of the bones of the pelvis. Electronically Signed   By: Iven Finn M.D.   On: 05/10/2022 22:17   DG HIP UNILAT WITH PELVIS 2-3 VIEWS RIGHT  Result Date: 05/10/2022 CLINICAL DATA:  [id number].  Fall bilateral hip pain. EXAM: DG HIP (WITH OR WITHOUT PELVIS) 2-3V LEFT; DG HIP (WITH OR WITHOUT PELVIS) 2-3V RIGHT COMPARISON:  None Available. FINDINGS: Acute superiorly displaced left femoral neck fracture. No left hip dislocation. There is no evidence of hip fracture or dislocation right hip. No acute displaced fracture or diastasis of the bones of the pelvis. There is no evidence of severe arthropathy or other focal bone abnormality. Vascular calcifications. IMPRESSION: 1. Acute superiorly displaced left femoral neck fracture. 2. No acute displaced fracture or dislocation of the right hip. 3. No acute displaced fracture or diastasis of the bones of the pelvis.  Electronically Signed   By: Iven Finn M.D.   On: 05/10/2022 22:17   DG Chest 1 View  Result Date: 05/10/2022 CLINICAL DATA:  Bilateral hip pain after a fall. EXAM: CHEST  1 VIEW COMPARISON:  06/15/2017 FINDINGS: Postoperative changes in the mediastinum. Normal heart size and pulmonary vascularity. No focal airspace disease or consolidation in the lungs. No blunting of costophrenic angles. No pneumothorax. Mediastinal contours appear intact. IMPRESSION: No active disease. Electronically Signed   By: Lucienne Capers M.D.   On: 05/10/2022 22:15    Positive ROS: All other systems have been reviewed and were otherwise negative with the exception of those mentioned in the HPI and as above.  Physical Exam: General: Alert, no acute distress Cardiovascular: No pedal edema Respiratory: No cyanosis, no use of accessory musculature GI: No organomegaly, abdomen is soft and non-tender Skin: No lesions in the area of chief complaint Neurologic: Sensation intact  distally Psychiatric: Patient is competent for consent with normal mood and affect Lymphatic: No axillary or cervical lymphadenopathy  MUSCULOSKELETAL:   Left lower extremity: Diffuse tenderness about the hip, range of motion deferred, no tenderness about the knee or ankle. Compartments soft. Good cap refill. Motor and sensory intact distally.  Assessment: 81 year old female with multiple medical comorbidities admitted with a displaced left femoral neck fracture  Plan: I had a long discussion with the patient regarding the nature of her fracture and operative and nonoperative treatment options as well as risk and benefits of each..  Patient has multiple medical comorbidities and has been cleared from a cardiology standpoint.  Patient is on Plavix and we discussed that it typically takes over 5 days for return of normal platelet function after discontinuing this medication.  We discussed the risks and benefits of delaying surgery versus  surgery within the first 48 hours as well as multiple studies showing no difference in overall bleeding complications when comparing early surgery to delayed surgery.  Recommendation was made for left hip hemiarthroplasty.  All questions were addressed and informed consent was provided.  Patient would like to proceed with surgery.    Renee Harder, MD    05/11/2022 3:05 PM

## 2022-05-11 NOTE — Plan of Care (Signed)

## 2022-05-11 NOTE — Transfer of Care (Signed)
Immediate Anesthesia Transfer of Care Note  Patient: Carrie Kane  Procedure(s) Performed: ARTHROPLASTY BIPOLAR HIP (HEMIARTHROPLASTY) (Left: Hip) APPLICATION OF WOUND VAC  Patient Location: PACU  Anesthesia Type:General  Level of Consciousness: awake, alert  and oriented  Airway & Oxygen Therapy: Patient Spontanous Breathing and Patient connected to face mask oxygen  Post-op Assessment: Report given to RN and Post -op Vital signs reviewed and stable  Post vital signs: Reviewed and stable  Last Vitals:  Vitals Value Taken Time  BP 168/73 05/11/22 1730  Temp    Pulse 90 05/11/22 1732  Resp 15 05/11/22 1732  SpO2 100 % 05/11/22 1732  Vitals shown include unvalidated device data.  Last Pain:  Vitals:   05/11/22 1459  TempSrc: Temporal  PainSc: 6       Patients Stated Pain Goal: 0 (34/91/79 1505)  Complications: No notable events documented.

## 2022-05-12 ENCOUNTER — Encounter: Payer: Self-pay | Admitting: Orthopaedic Surgery

## 2022-05-12 DIAGNOSIS — S72002A Fracture of unspecified part of neck of left femur, initial encounter for closed fracture: Secondary | ICD-10-CM | POA: Diagnosis not present

## 2022-05-12 LAB — BASIC METABOLIC PANEL
Anion gap: 10 (ref 5–15)
BUN: 15 mg/dL (ref 8–23)
CO2: 26 mmol/L (ref 22–32)
Calcium: 8.7 mg/dL — ABNORMAL LOW (ref 8.9–10.3)
Chloride: 105 mmol/L (ref 98–111)
Creatinine, Ser: 0.85 mg/dL (ref 0.44–1.00)
GFR, Estimated: >60 mL/min (ref >60)
Glucose, Bld: 273 mg/dL — ABNORMAL HIGH (ref 70–99)
Potassium: 3.8 mmol/L (ref 3.5–5.1)
Sodium: 141 mmol/L (ref 135–145)

## 2022-05-12 LAB — GLUCOSE, CAPILLARY
Glucose-Capillary: 264 mg/dL — ABNORMAL HIGH (ref 70–99)
Glucose-Capillary: 459 mg/dL — ABNORMAL HIGH (ref 70–99)
Glucose-Capillary: 453 mg/dL — ABNORMAL HIGH (ref 70–99)
Glucose-Capillary: 361 mg/dL — ABNORMAL HIGH (ref 70–99)
Glucose-Capillary: 106 mg/dL — ABNORMAL HIGH (ref 70–99)

## 2022-05-12 LAB — CBC
HCT: 25.6 % — ABNORMAL LOW (ref 36.0–46.0)
Hemoglobin: 8.3 g/dL — ABNORMAL LOW (ref 12.0–15.0)
MCH: 29.6 pg (ref 26.0–34.0)
MCHC: 32.4 g/dL (ref 30.0–36.0)
MCV: 91.4 fL (ref 80.0–100.0)
Platelets: 196 10*3/uL (ref 150–400)
RBC: 2.8 MIL/uL — ABNORMAL LOW (ref 3.87–5.11)
RDW: 14.3 % (ref 11.5–15.5)
WBC: 10.4 10*3/uL (ref 4.0–10.5)
nRBC: 0 % (ref 0.0–0.2)

## 2022-05-12 MED ORDER — LIVING WELL WITH DIABETES BOOK
Freq: Once | Status: DC
  Filled 2022-05-12: qty 1

## 2022-05-12 MED ORDER — ONDANSETRON HCL 4 MG/2ML IJ SOLN
4.0000 mg | Freq: Four times a day (QID) | INTRAMUSCULAR | Status: DC | PRN
  Administered 2022-05-12: 4 mg via INTRAVENOUS
  Filled 2022-05-12: qty 2

## 2022-05-12 MED ORDER — INSULIN ASPART 100 UNIT/ML IJ SOLN
0.0000 [IU] | Freq: Every day | INTRAMUSCULAR | Status: DC
  Administered 2022-05-13: 3 [IU] via SUBCUTANEOUS
  Filled 2022-05-12: qty 1

## 2022-05-12 MED ORDER — INSULIN ASPART 100 UNIT/ML IJ SOLN
15.0000 [IU] | Freq: Once | INTRAMUSCULAR | Status: AC
Start: 2022-05-12 — End: 2022-05-12
  Administered 2022-05-12: 15 [IU] via SUBCUTANEOUS
  Filled 2022-05-12: qty 1

## 2022-05-12 MED ORDER — ACETAMINOPHEN 325 MG PO TABS
650.0000 mg | ORAL_TABLET | Freq: Four times a day (QID) | ORAL | Status: DC | PRN
  Administered 2022-05-13: 650 mg via ORAL
  Filled 2022-05-12: qty 2

## 2022-05-12 MED ORDER — CLOPIDOGREL BISULFATE 75 MG PO TABS
75.0000 mg | ORAL_TABLET | Freq: Every day | ORAL | Status: DC
  Administered 2022-05-12 – 2022-05-14 (×3): 75 mg via ORAL
  Filled 2022-05-12 (×3): qty 1

## 2022-05-12 MED ORDER — CHLORHEXIDINE GLUCONATE CLOTH 2 % EX PADS
6.0000 | MEDICATED_PAD | Freq: Every day | CUTANEOUS | Status: DC
  Administered 2022-05-12 – 2022-05-14 (×3): 6 via TOPICAL

## 2022-05-12 MED ORDER — ASPIRIN 81 MG PO TBEC
81.0000 mg | DELAYED_RELEASE_TABLET | Freq: Two times a day (BID) | ORAL | Status: DC
  Administered 2022-05-12 – 2022-05-14 (×5): 81 mg via ORAL
  Filled 2022-05-12 (×5): qty 1

## 2022-05-12 MED ORDER — INSULIN ASPART 100 UNIT/ML IJ SOLN
0.0000 [IU] | Freq: Three times a day (TID) | INTRAMUSCULAR | Status: DC
  Administered 2022-05-12: 15 [IU] via SUBCUTANEOUS
  Administered 2022-05-13: 11 [IU] via SUBCUTANEOUS
  Administered 2022-05-13 (×2): 5 [IU] via SUBCUTANEOUS
  Administered 2022-05-14: 2 [IU] via SUBCUTANEOUS
  Filled 2022-05-12 (×5): qty 1

## 2022-05-12 NOTE — Anesthesia Postprocedure Evaluation (Signed)
Anesthesia Post Note  Patient: Carrie Kane  Procedure(s) Performed: ARTHROPLASTY BIPOLAR HIP (HEMIARTHROPLASTY) (Left: Hip) APPLICATION OF WOUND VAC  Patient location during evaluation: PACU Anesthesia Type: General Level of consciousness: awake and alert Pain management: pain level controlled Vital Signs Assessment: post-procedure vital signs reviewed and stable Respiratory status: spontaneous breathing, nonlabored ventilation, respiratory function stable and patient connected to nasal cannula oxygen Cardiovascular status: blood pressure returned to baseline and stable Postop Assessment: no apparent nausea or vomiting Anesthetic complications: no   No notable events documented.   Last Vitals:  Vitals:   05/11/22 2003 05/11/22 2330  BP: (!) 145/67 (!) 179/62  Pulse: 66 72  Resp: 20 20  Temp: 36.5 C 36.9 C  SpO2: 100% 100%    Last Pain:  Vitals:   05/12/22 0101  TempSrc:   PainSc: 5                  Arita Miss

## 2022-05-12 NOTE — Progress Notes (Signed)
Subjective:  Patient reports pain as well controlled.  Patient is currently resting comfortably.   Objective:   VITALS:   Vitals:   05/11/22 1814 05/11/22 2003 05/11/22 2330 05/12/22 0507  BP: (!) 149/70 (!) 145/67 (!) 179/62 (!) 137/50  Pulse: 66 66 72 80  Resp: '18 20 20 20  '$ Temp: 97.8 F (36.6 C) 97.7 F (36.5 C) 98.4 F (36.9 C) 98.4 F (36.9 C)  TempSrc:      SpO2: 100% 100% 100% 100%  Weight:      Height:        PHYSICAL EXAM:  General: Alert, comfortable  Left lower extremity: Dressing is clean dry and intact, wound VAC intact, left lower extremity is grossly neurovascular intact  LABS  Results for orders placed or performed during the hospital encounter of 05/10/22 (from the past 24 hour(s))  Glucose, capillary     Status: Abnormal   Collection Time: 05/11/22  1:08 PM  Result Value Ref Range   Glucose-Capillary 289 (H) 70 - 99 mg/dL  Glucose, capillary     Status: Abnormal   Collection Time: 05/11/22  2:55 PM  Result Value Ref Range   Glucose-Capillary 392 (H) 70 - 99 mg/dL   Comment 1 Notify RN    Comment 2 Document in Chart   Glucose, capillary     Status: Abnormal   Collection Time: 05/11/22  5:33 PM  Result Value Ref Range   Glucose-Capillary 261 (H) 70 - 99 mg/dL  Glucose, capillary     Status: Abnormal   Collection Time: 05/11/22  9:45 PM  Result Value Ref Range   Glucose-Capillary 271 (H) 70 - 99 mg/dL  Glucose, capillary     Status: Abnormal   Collection Time: 05/11/22 11:55 PM  Result Value Ref Range   Glucose-Capillary 247 (H) 70 - 99 mg/dL   Comment 1 Notify RN   CBC     Status: Abnormal   Collection Time: 05/12/22  4:12 AM  Result Value Ref Range   WBC 10.4 4.0 - 10.5 K/uL   RBC 2.80 (L) 3.87 - 5.11 MIL/uL   Hemoglobin 8.3 (L) 12.0 - 15.0 g/dL   HCT 25.6 (L) 36.0 - 46.0 %   MCV 91.4 80.0 - 100.0 fL   MCH 29.6 26.0 - 34.0 pg   MCHC 32.4 30.0 - 36.0 g/dL   RDW 14.3 11.5 - 15.5 %   Platelets 196 150 - 400 K/uL   nRBC 0.0 0.0 - 0.2 %   Basic metabolic panel     Status: Abnormal   Collection Time: 05/12/22  4:12 AM  Result Value Ref Range   Sodium 141 135 - 145 mmol/L   Potassium 3.8 3.5 - 5.1 mmol/L   Chloride 105 98 - 111 mmol/L   CO2 26 22 - 32 mmol/L   Glucose, Bld 273 (H) 70 - 99 mg/dL   BUN 15 8 - 23 mg/dL   Creatinine, Ser 0.85 0.44 - 1.00 mg/dL   Calcium 8.7 (L) 8.9 - 10.3 mg/dL   GFR, Estimated >60 >60 mL/min   Anion gap 10 5 - 15  Glucose, capillary     Status: Abnormal   Collection Time: 05/12/22  4:59 AM  Result Value Ref Range   Glucose-Capillary 264 (H) 70 - 99 mg/dL    DG Pelvis Portable  Result Date: 05/11/2022 CLINICAL DATA:  Postoperative left hip EXAM: PORTABLE PELVIS 1-2 VIEWS COMPARISON:  05/10/2022 FINDINGS: Interval postoperative changes with placement of a left hip hemiarthroplasty. Components  appear well seated. No dislocation. Pelvis appears intact. Degenerative changes in the right hip. Skin clips and soft tissue gas are consistent with recent surgery. Vascular calcifications. IMPRESSION: Left hip hemiarthroplasty was placed. Components appear well seated. Recent postoperative changes are present. Electronically Signed   By: Lucienne Capers M.D.   On: 05/11/2022 18:01   CT HEAD WO CONTRAST (5MM)  Result Date: 05/11/2022 CLINICAL DATA:  Fall EXAM: CT HEAD WITHOUT CONTRAST CT CERVICAL SPINE WITHOUT CONTRAST TECHNIQUE: Multidetector CT imaging of the head and cervical spine was performed following the standard protocol without intravenous contrast. Multiplanar CT image reconstructions of the cervical spine were also generated. RADIATION DOSE REDUCTION: This exam was performed according to the departmental dose-optimization program which includes automated exposure control, adjustment of the mA and/or kV according to patient size and/or use of iterative reconstruction technique. COMPARISON:  MR head/neck dated 02/24/2022 FINDINGS: CT HEAD FINDINGS Brain: No evidence of acute infarction, hemorrhage,  hydrocephalus, extra-axial collection or mass lesion/mass effect. Subcortical white matter and periventricular small vessel ischemic changes. Vascular: Intracranial atherosclerosis. Skull: Normal. Negative for fracture or focal lesion. Sinuses/Orbits: The visualized paranasal sinuses are essentially clear. The mastoid air cells are unopacified. Other: None. CT CERVICAL SPINE FINDINGS Alignment: Reversal of the normal cervical lordosis, likely positional. Skull base and vertebrae: No acute fracture. No primary bone lesion or focal pathologic process. Soft tissues and spinal canal: No prevertebral fluid or swelling. No visible canal hematoma. Disc levels: Mild degenerative changes of the mid cervical spine. Spinal canal is patent. Upper chest: Visualized lung apices are clear. Other: Visualized thyroid is unremarkable. IMPRESSION: No evidence of acute intracranial abnormality. Small vessel ischemic changes. No evidence of acute traumatic injury to the cervical spine. Mild degenerative changes. Electronically Signed   By: Julian Hy M.D.   On: 05/11/2022 01:34   CT CERVICAL SPINE WO CONTRAST  Result Date: 05/11/2022 CLINICAL DATA:  Fall EXAM: CT HEAD WITHOUT CONTRAST CT CERVICAL SPINE WITHOUT CONTRAST TECHNIQUE: Multidetector CT imaging of the head and cervical spine was performed following the standard protocol without intravenous contrast. Multiplanar CT image reconstructions of the cervical spine were also generated. RADIATION DOSE REDUCTION: This exam was performed according to the departmental dose-optimization program which includes automated exposure control, adjustment of the mA and/or kV according to patient size and/or use of iterative reconstruction technique. COMPARISON:  MR head/neck dated 02/24/2022 FINDINGS: CT HEAD FINDINGS Brain: No evidence of acute infarction, hemorrhage, hydrocephalus, extra-axial collection or mass lesion/mass effect. Subcortical white matter and periventricular small  vessel ischemic changes. Vascular: Intracranial atherosclerosis. Skull: Normal. Negative for fracture or focal lesion. Sinuses/Orbits: The visualized paranasal sinuses are essentially clear. The mastoid air cells are unopacified. Other: None. CT CERVICAL SPINE FINDINGS Alignment: Reversal of the normal cervical lordosis, likely positional. Skull base and vertebrae: No acute fracture. No primary bone lesion or focal pathologic process. Soft tissues and spinal canal: No prevertebral fluid or swelling. No visible canal hematoma. Disc levels: Mild degenerative changes of the mid cervical spine. Spinal canal is patent. Upper chest: Visualized lung apices are clear. Other: Visualized thyroid is unremarkable. IMPRESSION: No evidence of acute intracranial abnormality. Small vessel ischemic changes. No evidence of acute traumatic injury to the cervical spine. Mild degenerative changes. Electronically Signed   By: Julian Hy M.D.   On: 05/11/2022 01:34   DG HIP UNILAT WITH PELVIS 2-3 VIEWS LEFT  Result Date: 05/10/2022 CLINICAL DATA:  [id number].  Fall bilateral hip pain. EXAM: DG HIP (WITH OR WITHOUT PELVIS) 2-3V LEFT;  DG HIP (WITH OR WITHOUT PELVIS) 2-3V RIGHT COMPARISON:  None Available. FINDINGS: Acute superiorly displaced left femoral neck fracture. No left hip dislocation. There is no evidence of hip fracture or dislocation right hip. No acute displaced fracture or diastasis of the bones of the pelvis. There is no evidence of severe arthropathy or other focal bone abnormality. Vascular calcifications. IMPRESSION: 1. Acute superiorly displaced left femoral neck fracture. 2. No acute displaced fracture or dislocation of the right hip. 3. No acute displaced fracture or diastasis of the bones of the pelvis. Electronically Signed   By: Iven Finn M.D.   On: 05/10/2022 22:17   DG HIP UNILAT WITH PELVIS 2-3 VIEWS RIGHT  Result Date: 05/10/2022 CLINICAL DATA:  [id number].  Fall bilateral hip pain. EXAM: DG HIP (WITH  OR WITHOUT PELVIS) 2-3V LEFT; DG HIP (WITH OR WITHOUT PELVIS) 2-3V RIGHT COMPARISON:  None Available. FINDINGS: Acute superiorly displaced left femoral neck fracture. No left hip dislocation. There is no evidence of hip fracture or dislocation right hip. No acute displaced fracture or diastasis of the bones of the pelvis. There is no evidence of severe arthropathy or other focal bone abnormality. Vascular calcifications. IMPRESSION: 1. Acute superiorly displaced left femoral neck fracture. 2. No acute displaced fracture or dislocation of the right hip. 3. No acute displaced fracture or diastasis of the bones of the pelvis. Electronically Signed   By: Iven Finn M.D.   On: 05/10/2022 22:17   DG Chest 1 View  Result Date: 05/10/2022 CLINICAL DATA:  Bilateral hip pain after a fall. EXAM: CHEST  1 VIEW COMPARISON:  06/15/2017 FINDINGS: Postoperative changes in the mediastinum. Normal heart size and pulmonary vascularity. No focal airspace disease or consolidation in the lungs. No blunting of costophrenic angles. No pneumothorax. Mediastinal contours appear intact. IMPRESSION: No active disease. Electronically Signed   By: Lucienne Capers M.D.   On: 05/10/2022 22:15    Assessment/Plan: 1 Day Post-Op  Status post left hip hemiarthroplasty  -Appreciate multidisciplinary care/management -PT/OT: Weight-bear as tolerated, posterior hip precautions -DVT prophylaxis: typically recommend aspirin twice daily or Lovenox starting postop day 1, however patient also takes home clopidogrel, so will defer to cardiology/primary team -Prevena dressing/wound VAC to remain while inpatient, can transition to portable canister upon discharge -Okay to remove Foley -Follow-up in clinic in approximately 2 weeks for staple removal and x-rays   Renee Harder , MD 05/12/2022, 8:02 AM

## 2022-05-12 NOTE — Plan of Care (Signed)

## 2022-05-12 NOTE — Progress Notes (Signed)
Nutrition Follow-up  DOCUMENTATION CODES:   Non-severe (moderate) malnutrition in context of chronic illness  INTERVENTION:   -Continue Ensure Enlive po TID, each supplement provides 350 kcal and 20 grams of protein -Continue MVI with minerals daily -Liberalize diet to carb modified for wider variety of meal selections  NUTRITION DIAGNOSIS:   Moderate Malnutrition related to chronic illness (CAD) as evidenced by mild fat depletion, mild muscle depletion.  Ongoing  GOAL:   Patient will meet greater than or equal to 90% of their needs  Progressing   MONITOR:   PO intake, Supplement acceptance, Diet advancement  REASON FOR ASSESSMENT:   Consult Assessment of nutrition requirement/status, Hip fracture protocol  ASSESSMENT:   Pt with medical history significant for CAD status post coronary artery bypass grafting in 2018, stroke in May 2023 without residual deficits, hypertension, hyperlipidemia, insulin-dependent diabetes, peripheral arterial disease s/p revascularization 2022 with recent recommendation for repeat procedure due to intermittent claudication, and diastolic dysfunction, who presents following a fall from a standing position.  7/25- S/p Procedure(s): ARTHROPLASTY BIPOLAR HIP (HEMIARTHROPLASTY)  Reviewed I/O's: +1.9 L x 24 hours   UOP: 850 ml x 24 hours   Pt currently on a heart healthy, carb modified diet. No meal completion data currently available to assess at this time.   Per orthopedics note, plan to continue wound vac as inpatient and transition to portable canister at discharge.   Medications reviewed and include miralax and senna.   Labs reviewed: CBGS: 264-271 (inpatient orders for glycemic control are 0-9 units insulin aspart every 4 hours and 15 units insulin glargine-yfgn daily).    Diet Order:   Diet Order             Diet heart healthy/carb modified Room service appropriate? Yes; Fluid consistency: Thin  Diet effective now                    EDUCATION NEEDS:   Education needs have been addressed  Skin:  Skin Assessment: Reviewed RN Assessment  Last BM:  05/08/22  Height:   Ht Readings from Last 1 Encounters:  05/10/22 '5\' 8"'$  (1.727 m)    Weight:   Wt Readings from Last 1 Encounters:  05/11/22 69.9 kg    Ideal Body Weight:  63.6 kg  BMI:  Body mass index is 23.43 kg/m.  Estimated Nutritional Needs:   Kcal:  2000-2200  Protein:  110-125 grams  Fluid:  > 2 L    Loistine Chance, RD, LDN, [place] Registered Dietitian II Certified Diabetes Care and Education Specialist Please refer to [facility] for RD and/or RD on-call/weekend/after hours pager

## 2022-05-12 NOTE — Progress Notes (Signed)
PT Cancellation Note  Patient Details Name: Carrie Kane MRN: [id number] DOB: 30-Aug-1941   Cancelled Treatment:    Reason Eval/Treat Not Completed: Medical issues which prohibited therapy (CBG remain elevated after recheck, Will defer PT at this time, resume services once patient is medically appropriate.)  2:59 PM, 05/12/22 Etta Grandchild, PT, DPT Physical Therapist - [facility]  [phone] ([place])    [place] C 05/12/2022, 2:59 PM

## 2022-05-12 NOTE — Evaluation (Signed)
Physical Therapy Evaluation Patient Details Name: Carrie Kane MRN: [id number] DOB: 04/04/1941 Today's Date: 05/12/2022  History of Present Illness  Jakelin Taussig is a 43yoF who comes [facility] on 05/10/22 after a fall onto her Left hip. S/p L hemiarthoplasty on 05/11/22. PMH: CVA, HTN, HLD, DM2, PAD s/p angiopasty.  Clinical Impression  Pt admitted with above diagnosis. Pt currently with functional limitations due to the deficits listed below (see "PT Problem List"). Upon entry, pt in chair, awake and agreeable to participate. The pt is alert, pleasant, interactive, and able to provide info regarding prior level of function, both in tolerance and independence. Pt requires heavy cues and close guarding for safe rise to standing, similar with ability to take a few steps forward and backward, but requires heavy BUE support for safe stepping. Session ended after BG found to be in 450s. Pt has 8+ stairs to enter the home, will need to progress in mobility prior to being able to safely make that transition. Patient's performance this date reveals decreased ability, independence, and tolerance in performing all basic mobility required for performance of activities of daily living. Pt requires additional DME, close physical assistance, and cues for safe participate in mobility. Pt will benefit from skilled PT intervention to increase independence and safety with basic mobility in preparation for discharge to the venue listed below.        Recommendations for follow up therapy are one component of a multi-disciplinary discharge planning process, led by the attending physician.  Recommendations may be updated based on patient status, additional functional criteria and insurance authorization.  Follow Up Recommendations Skilled nursing-short term rehab (<3 hours/day) Can patient physically be transported by private vehicle: No    Assistance Recommended at Discharge Set up Supervision/Assistance  Patient can  return home with the following  A little help with walking and/or transfers;A little help with bathing/dressing/bathroom;Assistance with cooking/housework;Assist for transportation;Help with stairs or ramp for entrance    Equipment Recommendations Rolling walker (2 wheels)  Recommendations for Other Services       Functional Status Assessment Patient has had a recent decline in their functional status and demonstrates the ability to make significant improvements in function in a reasonable and predictable amount of time.     Precautions / Restrictions Precautions Precautions: Fall;Posterior Hip Restrictions Weight Bearing Restrictions: Yes LLE Weight Bearing: Weight bearing as tolerated      Mobility  Bed Mobility               General bed mobility comments: in recliner upon entry    Transfers Overall transfer level: Needs assistance Equipment used: Rolling walker (2 wheels) Transfers: Sit to/from Stand Sit to Stand: Min guard           General transfer comment: heavy cues for technique    Ambulation/Gait Ambulation/Gait assistance: Min guard, Supervision Gait Distance (Feet): 4 Feet Assistive device: Rolling walker (2 wheels) Gait Pattern/deviations: Step-to pattern       General Gait Details: takes several small steps forward, then backward. Addiitonal AMB distance deferred due to elevated BG in 400s.  Stairs            Wheelchair Mobility    Modified Rankin (Stroke Patients Only)       Balance  Pertinent Vitals/Pain Pain Assessment Pain Assessment: 0-10 Pain Score: 6  Pain Location: operative hip Pain Descriptors / Indicators: Aching Pain Intervention(s): Limited activity within patient's tolerance, Monitored during session, Premedicated before session    Home Living Family/patient expects to be discharged to:: Private residence Living Arrangements: Spouse/significant  other Available Help at Discharge: Family;Available 24 hours/day Type of Home: House Home Access: Stairs to enter Entrance Stairs-Rails: Psychiatric nurse of Steps: 6 (8 steps basement, can reach both hand rails)   Home Layout: One level Home Equipment: Shower seat;Cane - single point;Grab bars - tub/shower      Prior Function Prior Level of Function : Independent/Modified Independent;Driving             Mobility Comments: Pt used a SPC for mobility ADLs Comments: Independent with ADLs     Hand Dominance        Extremity/Trunk Assessment   Upper Extremity Assessment Upper Extremity Assessment: Generalized weakness    Lower Extremity Assessment Lower Extremity Assessment: Generalized weakness    Cervical / Trunk Assessment Cervical / Trunk Assessment: Normal  Communication   Communication: No difficulties  Cognition Arousal/Alertness: Awake/alert Behavior During Therapy: WFL for tasks assessed/performed Overall Cognitive Status: Within Functional Limits for tasks assessed                                          General Comments      Exercises     Assessment/Plan    PT Assessment Patient needs continued PT services  PT Problem List Decreased strength;Decreased range of motion;Decreased activity tolerance;Decreased balance;Decreased mobility;Decreased cognition;Decreased safety awareness;Decreased knowledge of use of DME       PT Treatment Interventions DME instruction;Balance training;Gait training;Stair training;Functional mobility training;Therapeutic activities;Therapeutic exercise;Neuromuscular re-education;Patient/family education    PT Goals (Current goals can be found in the Care Plan section)  Acute Rehab PT Goals Patient Stated Goal: regain independent AMB PT Goal Formulation: With patient Time For Goal Achievement: 05/26/22 Potential to Achieve Goals: Good    Frequency BID     Co-evaluation                AM-PAC PT "6 Clicks" Mobility  Outcome Measure Help needed turning from your back to your side while in a flat bed without using bedrails?: A Lot Help needed moving from lying on your back to sitting on the side of a flat bed without using bedrails?: A Lot Help needed moving to and from a bed to a chair (including a wheelchair)?: A Lot Help needed standing up from a chair using your arms (e.g., wheelchair or bedside chair)?: A Lot Help needed to walk in hospital room?: A Lot Help needed climbing 3-5 steps with a railing? : A Lot 6 Click Score: 12    End of Session   Activity Tolerance: Patient tolerated treatment well;Treatment limited secondary to medical complications (Comment) Patient left: in chair;with family/visitor present;with call bell/phone within reach Nurse Communication: Mobility status PT Visit Diagnosis: Difficulty in walking, not elsewhere classified (R26.2);Unsteadiness on feet (R26.81);Muscle weakness (generalized) (M62.81)    Time: 3825-0539 PT Time Calculation (min) (ACUTE ONLY): 10 min   Charges:   PT Evaluation $PT Eval Moderate Complexity: 1 Mod         1:26 PM, 05/12/22 Etta Grandchild, PT, DPT Physical Therapist - [facility]  [phone] ([place])    [place] C 05/12/2022,  1:26 PM

## 2022-05-12 NOTE — Evaluation (Signed)
Occupational Therapy Evaluation Patient Details Name: Carrie Kane MRN: [id number] DOB: 1941-02-03 Today's Date: 05/12/2022   History of Present Illness Carrie Kane is a 34yoF who comes to [facility] on 05/10/22 after a fall onto her Left hip. S/p L hemiarthoplasty on 05/11/22. PMH: CVA, HTN, HLD, DM2, PAD s/p angiopasty.   Clinical Impression   Pt was seen for OT evaluation this date. Prior to hospital admission, pt was driving, using a SPC for mobility and independent with ADLs. Pt lives with spouse in a one level house with 6 steps to enter, and 8 steps to enter through the basement. Pt presents to acute OT demonstrating impaired ADL performance and functional mobility 2/2 decreased activity tolerance and functional strength/ROM/balance deficits. Pt currently requires MOD A for supine>sit - needs assistance with trunk and L LE. MAX A for LB access in sitting. MIN A from elevated surface with vcs for safe hand placement for STS. MOD A + RW with multimodal cueing for sequencing/managing RW, maintaining pcns, and safe hand placement for simulated stand pivot t/f to [facility]. Pt deferred further ambulation 2/2 pain. Pt educated on posterior hip precautions, purpose of hip abduction pillow, and safe ADL participation. Pt verbalized understanding of instruction. Pt would benefit from skilled OT to address noted impairments and functional limitations (see below for any additional details). Upon hospital discharge, recommend STR to maximize pt safety and return to PLOF.      Recommendations for follow up therapy are one component of a multi-disciplinary discharge planning process, led by the attending physician.  Recommendations may be updated based on patient status, additional functional criteria and insurance authorization.   Follow Up Recommendations  Skilled nursing-short term rehab (<3 hours/day)    Assistance Recommended at Discharge Frequent or constant Supervision/Assistance  Patient can return home  with the following A little help with walking and/or transfers;A lot of help with bathing/dressing/bathroom;Assistance with cooking/housework;Assist for transportation;Help with stairs or ramp for entrance    Functional Status Assessment  Patient has had a recent decline in their functional status and demonstrates the ability to make significant improvements in function in a reasonable and predictable amount of time.  Equipment Recommendations  BSC/3in1    Recommendations for Other Services       Precautions / Restrictions Precautions Precautions: Fall;Posterior Hip Restrictions Weight Bearing Restrictions: Yes LLE Weight Bearing: Weight bearing as tolerated      Mobility Bed Mobility Overal bed mobility: Needs Assistance Bed Mobility: Supine to Sit     Supine to sit: Mod assist     General bed mobility comments: Needs assistance with trunk and L LE    Transfers Overall transfer level: Needs assistance Equipment used: Rolling walker (2 wheels) Transfers: Sit to/from Stand, Bed to chair/wheelchair/BSC Sit to Stand: Min assist, From elevated surface Stand pivot transfers: Mod assist                Balance Overall balance assessment: Needs assistance Sitting-balance support: Feet supported, No upper extremity supported Sitting balance-Leahy Scale: Fair     Standing balance support: Bilateral upper extremity supported, Reliant on assistive device for balance Standing balance-Leahy Scale: Fair                             ADL either performed or assessed with clinical judgement   ADL Overall ADL's : Needs assistance/impaired  General ADL Comments: MOD A with multimodal cueing for sequencing. maintaining pcns, and safe hand placement for simulated BSC t/f. MAX A for LB access in sitting.      Pertinent Vitals/Pain Pain Assessment Pain Assessment: Faces Faces Pain Scale: Hurts even more Pain  Location: L operative hip Pain Descriptors / Indicators: Discomfort, Grimacing, Guarding, Sore Pain Intervention(s): Limited activity within patient's tolerance, Monitored during session, Repositioned     Hand Dominance     Extremity/Trunk Assessment Upper Extremity Assessment Upper Extremity Assessment: Generalized weakness   Lower Extremity Assessment Lower Extremity Assessment: Generalized weakness   Cervical / Trunk Assessment Cervical / Trunk Assessment: Normal   Communication Communication Communication: No difficulties   Cognition Arousal/Alertness: Awake/alert Behavior During Therapy: WFL for tasks assessed/performed Overall Cognitive Status: Within Functional Limits for tasks assessed                                                  Home Living Family/patient expects to be discharged to:: Private residence Living Arrangements: Spouse/significant other Available Help at Discharge: Family;Available 24 hours/day Type of Home: House Home Access: Stairs to enter [employer] of Steps: 6 (8 steps basement, can reach both hand rails) Entrance Stairs-Rails: Right;Left Home Layout: One level     Bathroom Shower/Tub: Occupational psychologist: Handicapped height     Home Equipment: Shower seat;Cane - single point;Grab bars - tub/shower          Prior Functioning/Environment Prior Level of Function : Independent/Modified Independent;Driving             Mobility Comments: Pt used a SPC for mobility ADLs Comments: Independent with ADLs        OT Problem List: Decreased strength;Decreased range of motion;Decreased activity tolerance;Impaired balance (sitting and/or standing)      OT Treatment/Interventions: Self-care/ADL training;Energy conservation;Therapeutic exercise;DME and/or AE instruction;Therapeutic activities;Patient/family education;Balance training    OT Goals(Current goals can be found in the care plan  section) Acute Rehab OT Goals Patient Stated Goal: to improve pain OT Goal Formulation: With patient Time For Goal Achievement: 05/26/22 Potential to Achieve Goals: Good ADL Goals Pt Will Perform Grooming: standing;with supervision (tolerate >10 minutes of standing with LRAD) Pt Will Perform Lower Body Dressing: with min assist;with adaptive equipment;sit to/from stand (with LRAD) Pt Will Transfer to Toilet: with supervision;ambulating;regular height toilet (with raised height and LRAD)  OT Frequency: Min 2X/week       AM-PAC OT "6 Clicks" Daily Activity     Outcome Measure Help from another person eating meals?: None Help from another person taking care of personal grooming?: A Little Help from another person toileting, which includes using toliet, bedpan, or urinal?: A Lot Help from another person bathing (including washing, rinsing, drying)?: A Lot Help from another person to put on and taking off regular upper body clothing?: A Little Help from another person to put on and taking off regular lower body clothing?: A Lot 6 Click Score: 16   End of Session Equipment Utilized During Treatment: Rolling walker (2 wheels);Gait belt Nurse Communication: Mobility status  Activity Tolerance: Patient tolerated treatment well;Patient limited by pain Patient left: in chair;with call bell/phone within reach;with SCD's reapplied  OT Visit Diagnosis: Other abnormalities of gait and mobility (R26.89);Muscle weakness (generalized) (M62.81)  Time: 7544-9201 OT Time Calculation (min): 37 min Charges:  OT General Charges $OT Visit: 1 Visit OT Evaluation $OT Eval Moderate Complexity: 1 Mod OT Treatments $Self Care/Home Management : 23-37 mins  [employer], OTDS  [employer] 05/12/2022, 1:26 PM

## 2022-05-12 NOTE — TOC Progression Note (Signed)
Transition of Care [facility]) - Progression Note    Patient Details  Name: Carrie Kane MRN: [id number] Date of Birth: 1941/07/03  Transition of Care [facility]) CM/SW [place], RN Phone Number: 05/12/2022, 3:02 PM  Clinical Narrative:    Spoke with the patient, they are agreeable to a bed search to go to STR SNF PASSR obtained, FL2 completed, Bed search sent Will review bed offers once obtained         Expected Discharge Plan and Services                                                 Social Determinants of Health (SDOH) Interventions    Readmission Risk Interventions     No data to display

## 2022-05-12 NOTE — Inpatient Diabetes Management (Addendum)
Inpatient Diabetes Program Recommendations  AACE/ADA: New Consensus Statement on Inpatient Glycemic Control (2015)  Target Ranges:  Prepandial:   less than 140 mg/dL      Peak postprandial:   less than 180 mg/dL (1-2 hours)      Critically ill patients:  140 - 180 mg/dL   Lab Results  Component Value Date   GLUCAP 264 (H) 05/12/2022   HGBA1C 8.8 (H) 05/10/2022    Review of Glycemic Control  Latest Reference Range & Units 05/11/22 17:33 05/11/22 21:45 05/11/22 23:55 05/12/22 04:59  Glucose-Capillary 70 - 99 mg/dL 261 (H) 271 (H) 247 (H) 264 (H)  (H): Data is abnormally high  Diabetes history: DM2 Outpatient Diabetes medications: 75/25 20-30 units QAM, 10 units with lunch, 10-12 with supper, Lantus 38 units PRN QHS Current orders for Inpatient glycemic control: Novolog 0-9 units Q4H, Semglee 15 units QD  Please consider: Novolog 0-9 units TID and 0-5 QHS as she has a diet order.    Semglee was held last night due to "patient not available".  CBGs remain mid 200's.  Please consider administering Semglee now and changing times moving forward to QD.    Will speak with her regarding A1C today and DM management.  Addendum @ 11:45:  Spoke with patient at bedside.  Reviewed patient's current A1c of 8.8% (Average BG of 206 mg/dL). Explained what a A1c is and what it measures. Also reviewed goal A1c with patient, importance of good glucose control @ home, and blood sugar goals.  Educated on The Plate Method, CHO's, portion control, CBGs at home fasting and mid afternoon, F/U with PCP every 3 months, bring meter to PCP office, long and short term complications of uncontrolled BG, importance of exercise and importance of good glucose control for optimal healing.   Ordered LWWD booklet.     Will continue to follow while inpatient.  Thank you, Reche Dixon, MSN, [place] Diabetes Coordinator Inpatient Diabetes Program [phone] (team pager from 8a-5p)

## 2022-05-12 NOTE — Progress Notes (Signed)
PROGRESS NOTE    Carrie Kane  TDV:[id number] DOB: January 01, 1941 DOA: 05/10/2022 PCP: Cletis Athens, MD    Brief Narrative:  81 y.o. female with medical history significant for CAD status post coronary artery bypass grafting in 2018, stroke in May 2023 without residual deficits, hypertension, hyperlipidemia, insulin-dependent diabetes, peripheral arterial disease s/p revascularization 2022 with recent recommendation for repeat procedure due to intermittent claudication, and diastolic dysfunction, who presents to the ED following a fall from a standing position.  She denies preceding headache, visual disturbance, one-sided numbness tingling or weakness in the face or extremities and denied preceding lightheadedness, palpitations, chest pain or shortness of breath.   Status post hemiarthroplasty for left femoral neck fracture on 7/25.  Tolerated procedure well.  Postoperative pain well controlled.   Assessment & Plan:   Principal Problem:   Left displaced femoral neck fracture (HCC) Active Problems:   Fall at home, initial encounter   Preoperative clearance   Type 2 diabetes mellitus without complication, with long-term current use of insulin (HCC)   CAD with Hx of CABG   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) (HCC)   Anemia   Hypothyroidism   Essential hypertension   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)   Malnutrition of moderate degree  Left displaced femoral neck fracture Status post left hip hemiarthroplasty 7/25 Tolerated procedure well Postoperative pain well controlled Plan: PT OT DVT prophylaxis with aspirin 81 twice daily Multimodal pain regimen Bowel regimen Some postoperative anemia noted however does not meet criteria for transfusion at this time Restart home Plavix 7/26   CAD Cabg x3 in 2018. No chest pain.  Has been evaluated by cardiology for pre-op cardiac clearance and has been cleared to proceed w/ surgery Plan: Restart  Plavix Continue statin Aspirin at 81 mg twice daily x4 to 6 weeks or as determined by orthopedic surgery Can resume 81 mg daily after this 4 to 6-week postoperative. Outpatient cardiology follow-up   PAD Seen by vascular earlier this year, has limb-threatening ischemia, they advised angiography, patient has declined for now. Currently LEs are warm, no claudication symptoms - aspirin, plavix, and statin as above - outpt vascular f/u   CVA Recent. No new neurologic symptoms - asa/plavix/statin as above   HTN Improved BP control - home clonidine   Insulin-dependent diabetes mellitus with hyperglycemia Here glucose elevated Likely secondary to [facility] being held on 7/25 Plan: Semglee 15 units nightly, escalate as appropriate Moderate sliding scale Nightly sliding scale Carb modified diet   Hypothyroid - home levothyroxine   DVT prophylaxis: Aspirin 81 twice daily Code Status: Full Family Communication: None today Disposition Plan: Status is: Inpatient Remains inpatient appropriate because: Hip fracture status postoperative repair.  Postoperative day #1.  Will likely need skilled nursing facility.   Level of care: Med-Surg  Consultants:  Orthopedic surgery Cardiology  Procedures:  Left hip hemiarthroplasty 7/25  Antimicrobials: None   Subjective: Seen and examined.  Sitting up on edge of bed working with occupational therapy.  Pain well controlled.  No apparent distress.  Objective: Vitals:   05/11/22 2003 05/11/22 2330 05/12/22 0507 05/12/22 1233  BP: (!) 145/67 (!) 179/62 (!) 137/50 (!) 92/44  Pulse: 66 72 80 87  Resp: '20 20 20 17  '$ Temp: 97.7 F (36.5 C) 98.4 F (36.9 C) 98.4 F (36.9 C) 98.5 F (36.9 C)  TempSrc:      SpO2: 100% 100% 100% 98%  Weight:      Height:        Intake/Output  Summary (Last 24 hours) at 05/12/2022 1453 Last data filed at 05/12/2022 0604 Gross per 24 hour  Intake 2879.46 ml  Output 1000 ml  Net 1879.46 ml   Filed  Weights   05/10/22 2141 05/11/22 1459  Weight: 69.9 kg 69.9 kg    Examination:  General exam: NAD Respiratory system: Lungs clear.  Normal work of breathing.  Room air Cardiovascular system: S1-S2, RRR, no murmurs, no pedal edema Gastrointestinal system: Soft, NT/ND, normal bowel sounds Central nervous system: Alert and oriented. No focal neurological deficits. Extremities: Left hip surgical site, dressing CDI Skin: No rashes, lesions or ulcers Psychiatry: Judgement and insight appear normal. Mood & affect appropriate.     Data Reviewed: I have personally reviewed following labs and imaging studies  CBC: Recent Labs  Lab 05/10/22 2138 05/12/22 0412  WBC 4.8 10.4  HGB 10.1* 8.3*  HCT 31.2* 25.6*  MCV 89.9 91.4  PLT 207 202   Basic Metabolic Panel: Recent Labs  Lab 05/10/22 2138 05/12/22 0412  NA 136 141  K 3.6 3.8  CL 102 105  CO2 26 26  GLUCOSE 128* 273*  BUN 16 15  CREATININE 0.78 0.85  CALCIUM 9.6 8.7*   GFR: Estimated Creatinine Clearance: 54.1 mL/min (by C-G formula based on SCr of 0.85 mg/dL). Liver Function Tests: Recent Labs  Lab 05/10/22 2138  AST 28  ALT 18  ALKPHOS 53  BILITOT 0.7  PROT 6.4*  ALBUMIN 3.8   No results for input(s): "LIPASE", "AMYLASE" in the last 168 hours. No results for input(s): "AMMONIA" in the last 168 hours. Coagulation Profile: Recent Labs  Lab 05/10/22 2138  INR 1.1   Cardiac Enzymes: No results for input(s): "CKTOTAL", "CKMB", "CKMBINDEX", "TROPONINI" in the last 168 hours. BNP (last 3 results) No results for input(s): "PROBNP" in the last 8760 hours. HbA1C: Recent Labs    05/10/22 2138  HGBA1C 8.8*   CBG: Recent Labs  Lab 05/11/22 2145 05/11/22 2355 05/12/22 0459 05/12/22 1150 05/12/22 1405  GLUCAP 271* 247* 264* 459* 453*   Lipid Profile: No results for input(s): "CHOL", "HDL", "LDLCALC", "TRIG", "CHOLHDL", "LDLDIRECT" in the last 72 hours. Thyroid Function Tests: No results for input(s):  "TSH", "T4TOTAL", "FREET4", "T3FREE", "THYROIDAB" in the last 72 hours. Anemia Panel: No results for input(s): "VITAMINB12", "FOLATE", "FERRITIN", "TIBC", "IRON", "RETICCTPCT" in the last 72 hours. Sepsis Labs: No results for input(s): "PROCALCITON", "LATICACIDVEN" in the last 168 hours.  No results found for this or any previous visit (from the past 240 hour(s)).       Radiology Studies: DG Pelvis Portable  Result Date: 05/11/2022 CLINICAL DATA:  Postoperative left hip EXAM: PORTABLE PELVIS 1-2 VIEWS COMPARISON:  05/10/2022 FINDINGS: Interval postoperative changes with placement of a left hip hemiarthroplasty. Components appear well seated. No dislocation. Pelvis appears intact. Degenerative changes in the right hip. Skin clips and soft tissue gas are consistent with recent surgery. Vascular calcifications. IMPRESSION: Left hip hemiarthroplasty was placed. Components appear well seated. Recent postoperative changes are present. Electronically Signed   By: Lucienne Capers M.D.   On: 05/11/2022 18:01   CT HEAD WO CONTRAST (5MM)  Result Date: 05/11/2022 CLINICAL DATA:  Fall EXAM: CT HEAD WITHOUT CONTRAST CT CERVICAL SPINE WITHOUT CONTRAST TECHNIQUE: Multidetector CT imaging of the head and cervical spine was performed following the standard protocol without intravenous contrast. Multiplanar CT image reconstructions of the cervical spine were also generated. RADIATION DOSE REDUCTION: This exam was performed according to the departmental dose-optimization program which includes automated exposure  control, adjustment of the mA and/or kV according to patient size and/or use of iterative reconstruction technique. COMPARISON:  MR head/neck dated 02/24/2022 FINDINGS: CT HEAD FINDINGS Brain: No evidence of acute infarction, hemorrhage, hydrocephalus, extra-axial collection or mass lesion/mass effect. Subcortical white matter and periventricular small vessel ischemic changes. Vascular: Intracranial  atherosclerosis. Skull: Normal. Negative for fracture or focal lesion. Sinuses/Orbits: The visualized paranasal sinuses are essentially clear. The mastoid air cells are unopacified. Other: None. CT CERVICAL SPINE FINDINGS Alignment: Reversal of the normal cervical lordosis, likely positional. Skull base and vertebrae: No acute fracture. No primary bone lesion or focal pathologic process. Soft tissues and spinal canal: No prevertebral fluid or swelling. No visible canal hematoma. Disc levels: Mild degenerative changes of the mid cervical spine. Spinal canal is patent. Upper chest: Visualized lung apices are clear. Other: Visualized thyroid is unremarkable. IMPRESSION: No evidence of acute intracranial abnormality. Small vessel ischemic changes. No evidence of acute traumatic injury to the cervical spine. Mild degenerative changes. Electronically Signed   By: Julian Hy M.D.   On: 05/11/2022 01:34   CT CERVICAL SPINE WO CONTRAST  Result Date: 05/11/2022 CLINICAL DATA:  Fall EXAM: CT HEAD WITHOUT CONTRAST CT CERVICAL SPINE WITHOUT CONTRAST TECHNIQUE: Multidetector CT imaging of the head and cervical spine was performed following the standard protocol without intravenous contrast. Multiplanar CT image reconstructions of the cervical spine were also generated. RADIATION DOSE REDUCTION: This exam was performed according to the departmental dose-optimization program which includes automated exposure control, adjustment of the mA and/or kV according to patient size and/or use of iterative reconstruction technique. COMPARISON:  MR head/neck dated 02/24/2022 FINDINGS: CT HEAD FINDINGS Brain: No evidence of acute infarction, hemorrhage, hydrocephalus, extra-axial collection or mass lesion/mass effect. Subcortical white matter and periventricular small vessel ischemic changes. Vascular: Intracranial atherosclerosis. Skull: Normal. Negative for fracture or focal lesion. Sinuses/Orbits: The visualized paranasal sinuses  are essentially clear. The mastoid air cells are unopacified. Other: None. CT CERVICAL SPINE FINDINGS Alignment: Reversal of the normal cervical lordosis, likely positional. Skull base and vertebrae: No acute fracture. No primary bone lesion or focal pathologic process. Soft tissues and spinal canal: No prevertebral fluid or swelling. No visible canal hematoma. Disc levels: Mild degenerative changes of the mid cervical spine. Spinal canal is patent. Upper chest: Visualized lung apices are clear. Other: Visualized thyroid is unremarkable. IMPRESSION: No evidence of acute intracranial abnormality. Small vessel ischemic changes. No evidence of acute traumatic injury to the cervical spine. Mild degenerative changes. Electronically Signed   By: Julian Hy M.D.   On: 05/11/2022 01:34   DG HIP UNILAT WITH PELVIS 2-3 VIEWS LEFT  Result Date: 05/10/2022 CLINICAL DATA:  [id number].  Fall bilateral hip pain. EXAM: DG HIP (WITH OR WITHOUT PELVIS) 2-3V LEFT; DG HIP (WITH OR WITHOUT PELVIS) 2-3V RIGHT COMPARISON:  None Available. FINDINGS: Acute superiorly displaced left femoral neck fracture. No left hip dislocation. There is no evidence of hip fracture or dislocation right hip. No acute displaced fracture or diastasis of the bones of the pelvis. There is no evidence of severe arthropathy or other focal bone abnormality. Vascular calcifications. IMPRESSION: 1. Acute superiorly displaced left femoral neck fracture. 2. No acute displaced fracture or dislocation of the right hip. 3. No acute displaced fracture or diastasis of the bones of the pelvis. Electronically Signed   By: Iven Finn M.D.   On: 05/10/2022 22:17   DG HIP UNILAT WITH PELVIS 2-3 VIEWS RIGHT  Result Date: 05/10/2022 CLINICAL DATA:  [id number].  Fall  bilateral hip pain. EXAM: DG HIP (WITH OR WITHOUT PELVIS) 2-3V LEFT; DG HIP (WITH OR WITHOUT PELVIS) 2-3V RIGHT COMPARISON:  None Available. FINDINGS: Acute superiorly displaced left femoral neck fracture.  No left hip dislocation. There is no evidence of hip fracture or dislocation right hip. No acute displaced fracture or diastasis of the bones of the pelvis. There is no evidence of severe arthropathy or other focal bone abnormality. Vascular calcifications. IMPRESSION: 1. Acute superiorly displaced left femoral neck fracture. 2. No acute displaced fracture or dislocation of the right hip. 3. No acute displaced fracture or diastasis of the bones of the pelvis. Electronically Signed   By: Iven Finn M.D.   On: 05/10/2022 22:17   DG Chest 1 View  Result Date: 05/10/2022 CLINICAL DATA:  Bilateral hip pain after a fall. EXAM: CHEST  1 VIEW COMPARISON:  06/15/2017 FINDINGS: Postoperative changes in the mediastinum. Normal heart size and pulmonary vascularity. No focal airspace disease or consolidation in the lungs. No blunting of costophrenic angles. No pneumothorax. Mediastinal contours appear intact. IMPRESSION: No active disease. Electronically Signed   By: Lucienne Capers M.D.   On: 05/10/2022 22:15        Scheduled Meds:  aspirin EC  81 mg Oral BID   atorvastatin  40 mg Oral Daily   Chlorhexidine Gluconate Cloth  6 each Topical Daily   cloNIDine  0.2 mg Oral BID   clopidogrel  75 mg Oral Daily   feeding supplement  237 mL Oral TID BM   insulin aspart  0-15 Units Subcutaneous TID WC   insulin aspart  0-5 Units Subcutaneous QHS   insulin glargine-yfgn  15 Units Subcutaneous QHS   irbesartan  37.5 mg Oral Daily   levothyroxine  100 mcg Oral Q0600   living well with diabetes book   Does not apply Once   multivitamin with minerals  1 tablet Oral Daily   polyethylene glycol  17 g Oral Daily   senna  1 tablet Oral QHS   Continuous Infusions:  methocarbamol (ROBAXIN) IV       LOS: 2 days      Sidney Ace, MD [facility]   If 7PM-7AM, please contact night-coverage  05/12/2022, 2:53 PM

## 2022-05-12 NOTE — NC FL2 (Signed)
[place] LEVEL OF CARE SCREENING TOOL     IDENTIFICATION  Patient Name: Carrie Kane Birthdate: 1940-12-03 Sex: female Admission Date (Current Location): 05/10/2022  [facility] and [place] Number:  Engineering geologist and Address:  [facility], [address] [place], [place] [address]      Provider Number: [id number]  Attending Physician Name and Address:  Sidney Ace, MD  Relative Name and Phone Number:  [name] [phone]    Current Level of Care: Hospital Recommended Level of Care: [place] Prior Approval Number:    Date Approved/Denied:   PASRR Number: [id number]  Discharge Plan: SNF    Current Diagnoses: Patient Active Problem List   Diagnosis Date Noted   Malnutrition of moderate degree 05/11/2022   Left displaced femoral neck fracture ([place]) 05/10/2022   Fall at home, initial encounter 05/10/2022   Preoperative clearance 05/10/2022   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) 05/10/2022   Essential hypertension 01/18/2021   Claudication ([place]) 12/23/2020   Hypothyroidism 12/23/2020   Anemia 06/17/2020   Weight loss 06/17/2020   Iron deficiency anemia due to chronic blood loss 06/17/2020   Abscess of left foot 06/12/2020   Pre-ulcerative calluses 04/03/2020   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) ([place]) 08/02/2017   Mixed hyperlipidemia 08/02/2017   CAD with Hx of CABG 08/01/2017   Type 2 diabetes mellitus without complication, with long-term current use of insulin ([place]) 04/28/2017   Bruit 04/28/2017   Unstable angina (HCC) 04/28/2017   Abnormal stress test 37/85/8850   Systolic dysfunction 27/74/1287   Acanthosis nigricans 01/26/2012   Disease of hair and hair follicles 86/76/7209   Hirsutism 01/26/2012   Local infection of skin and subcutaneous tissue 01/26/2012    Orientation RESPIRATION BLADDER Height & Weight     Self, Time, Situation, Place  Normal  Continent Weight: 69.9 kg Height:  '5\' 8"'$  (172.7 cm)  BEHAVIORAL SYMPTOMS/MOOD NEUROLOGICAL BOWEL NUTRITION STATUS      Continent Diet  AMBULATORY STATUS COMMUNICATION OF NEEDS Skin   Extensive Assist Verbally Normal, Surgical wounds                       Personal Care Assistance Level of Assistance  Bathing, Feeding, Dressing Bathing Assistance: Limited assistance Feeding assistance: Independent Dressing Assistance: Limited assistance     Functional Limitations Info             SPECIAL CARE FACTORS FREQUENCY  PT (By licensed PT), OT (By licensed OT)     PT Frequency: 5 times per week OT Frequency: 5 times per week            Contractures Contractures Info: Not present    Additional Factors Info  Code Status, Allergies Code Status Info: Full code Allergies Info: Shellfish Allergy, Flexeril (Cyclobenzaprine)           Current Medications (05/12/2022):  This is the current hospital active medication list Current Facility-Administered Medications  Medication Dose Route Frequency Provider Last Rate Last Admin   aspirin EC tablet 81 mg  81 mg Oral BID Ralene Muskrat B, MD   81 mg at 05/12/22 1210   atorvastatin (LIPITOR) tablet 40 mg  40 mg Oral Daily Renee Harder, MD   40 mg at 05/12/22 4709   Chlorhexidine Gluconate Cloth 2 % PADS 6 each  6 each Topical Daily Gwynne Edinger, MD   6 each at 05/12/22 0926   cloNIDine (CATAPRES) tablet 0.2 mg  0.2  mg Oral BID Renee Harder, MD   0.2 mg at 05/12/22 3235   clopidogrel (PLAVIX) tablet 75 mg  75 mg Oral Daily Ralene Muskrat B, MD   75 mg at 05/12/22 1210   feeding supplement (ENSURE ENLIVE / ENSURE PLUS) liquid 237 mL  237 mL Oral TID BM Renee Harder, MD   237 mL at 05/12/22 1330   HYDROcodone-acetaminophen (NORCO/VICODIN) 5-325 MG per tablet 1-2 tablet  1-2 tablet Oral Q6H PRN Renee Harder, MD   1 tablet at 05/12/22 0101   insulin aspart (novoLOG) injection 0-15 Units  0-15 Units  Subcutaneous TID WC Sreenath, Sudheer B, MD       insulin aspart (novoLOG) injection 0-5 Units  0-5 Units Subcutaneous QHS Sreenath, Sudheer B, MD       insulin glargine-yfgn (SEMGLEE) injection 15 Units  15 Units Subcutaneous QHS Renee Harder, MD       irbesartan (AVAPRO) tablet 37.5 mg  37.5 mg Oral Daily Renee Harder, MD   37.5 mg at 05/12/22 5732   levothyroxine (SYNTHROID) tablet 100 mcg  100 mcg Oral Q0600 Renee Harder, MD   100 mcg at 05/12/22 0510   living well with diabetes book MISC   Does not apply Once Ralene Muskrat B, MD       methocarbamol (ROBAXIN) tablet 500 mg  500 mg Oral Q6H PRN Renee Harder, MD   500 mg at 05/12/22 1036   Or   methocarbamol (ROBAXIN) 500 mg in dextrose 5 % 50 mL IVPB  500 mg Intravenous Q6H PRN Renee Harder, MD       morphine (PF) 2 MG/ML injection 0.5 mg  0.5 mg Intravenous Q2H PRN Renee Harder, MD   0.5 mg at 05/11/22 1017   multivitamin with minerals tablet 1 tablet  1 tablet Oral Daily Renee Harder, MD   1 tablet at 05/12/22 1210   nitroGLYCERIN (NITROSTAT) SL tablet 0.4 mg  0.4 mg Sublingual Q5 min PRN Renee Harder, MD       polyethylene glycol (MIRALAX / Floria Raveling) packet 17 g  17 g Oral Daily Renee Harder, MD       senna Donavan Burnet) tablet 8.6 mg  1 tablet Oral Dewain Penning, MD   8.6 mg at 05/11/22 2155     Discharge Medications: Please see discharge summary for a list of discharge medications.  Relevant Imaging Results:  Relevant Lab Results:   Additional Information SS# [id number]  Conception Oms, RN

## 2022-05-13 DIAGNOSIS — S72002A Fracture of unspecified part of neck of left femur, initial encounter for closed fracture: Secondary | ICD-10-CM | POA: Diagnosis not present

## 2022-05-13 LAB — CBC
HCT: 19.3 % — ABNORMAL LOW (ref 36.0–46.0)
Hemoglobin: 6.3 g/dL — ABNORMAL LOW (ref 12.0–15.0)
MCH: 29.3 pg (ref 26.0–34.0)
MCHC: 32.6 g/dL (ref 30.0–36.0)
MCV: 89.8 fL (ref 80.0–100.0)
Platelets: 140 10*3/uL — ABNORMAL LOW (ref 150–400)
RBC: 2.15 MIL/uL — ABNORMAL LOW (ref 3.87–5.11)
RDW: 14.5 % (ref 11.5–15.5)
WBC: 10.4 10*3/uL (ref 4.0–10.5)
nRBC: 0 % (ref 0.0–0.2)

## 2022-05-13 LAB — GLUCOSE, CAPILLARY
Glucose-Capillary: 201 mg/dL — ABNORMAL HIGH (ref 70–99)
Glucose-Capillary: 305 mg/dL — ABNORMAL HIGH (ref 70–99)
Glucose-Capillary: 231 mg/dL — ABNORMAL HIGH (ref 70–99)
Glucose-Capillary: 286 mg/dL — ABNORMAL HIGH (ref 70–99)

## 2022-05-13 LAB — BASIC METABOLIC PANEL
Anion gap: 5 (ref 5–15)
BUN: 23 mg/dL (ref 8–23)
CO2: 26 mmol/L (ref 22–32)
Calcium: 8.3 mg/dL — ABNORMAL LOW (ref 8.9–10.3)
Chloride: 105 mmol/L (ref 98–111)
Creatinine, Ser: 0.89 mg/dL (ref 0.44–1.00)
GFR, Estimated: >60 mL/min (ref >60)
Glucose, Bld: 133 mg/dL — ABNORMAL HIGH (ref 70–99)
Potassium: 3.8 mmol/L (ref 3.5–5.1)
Sodium: 136 mmol/L (ref 135–145)

## 2022-05-13 LAB — SURGICAL PATHOLOGY

## 2022-05-13 LAB — PREPARE RBC (CROSSMATCH)

## 2022-05-13 MED ORDER — SENNOSIDES-DOCUSATE SODIUM 8.6-50 MG PO TABS
2.0000 | ORAL_TABLET | Freq: Once | ORAL | Status: AC
  Administered 2022-05-13: 2 via ORAL
  Filled 2022-05-13: qty 2

## 2022-05-13 MED ORDER — SODIUM CHLORIDE 0.9% IV SOLUTION: Freq: Once | INTRAVENOUS | Status: AC

## 2022-05-13 MED ORDER — BISACODYL 10 MG RE SUPP
10.0000 mg | Freq: Every day | RECTAL | Status: DC | PRN
  Administered 2022-05-14: 10 mg via RECTAL
  Filled 2022-05-13: qty 1

## 2022-05-13 NOTE — Discharge Instructions (Signed)
Orthopedic discharge instructions: Patient has had Left hip hemiarthroplasty Patient should remain full weightbearing on the operative extremity with posterior hip precautions until follow-up Maintain Prevena dressing x 7 days after surgery then can change the as needed DVT prophylaxis includes Aspirin '81mg'$  BID Follow up with Dr. Minda Meo PA, Murilo Zanette PA-C at [employer] [facility] [employer], [place] office in 10-14 days for wound check, staple removal and x-ray.

## 2022-05-13 NOTE — Progress Notes (Signed)
PROGRESS NOTE    Carrie Kane  KDT:[id number] DOB: Dec 31, 1940 DOA: 05/10/2022 PCP: Cletis Athens, MD    Brief Narrative:  81 y.o. female with medical history significant for CAD status post coronary artery bypass grafting in 2018, stroke in May 2023 without residual deficits, hypertension, hyperlipidemia, insulin-dependent diabetes, peripheral arterial disease s/p revascularization 2022 with recent recommendation for repeat procedure due to intermittent claudication, and diastolic dysfunction, who presents to the ED following a fall from a standing position.  She denies preceding headache, visual disturbance, one-sided numbness tingling or weakness in the face or extremities and denied preceding lightheadedness, palpitations, chest pain or shortness of breath.   Status post hemiarthroplasty for left femoral neck fracture on 7/25.  Tolerated procedure well.  Postoperative pain well controlled.  Postoperative blood loss anemia noted.  Hemoglobin 6.3 on 7/27   Assessment & Plan:   Principal Problem:   Left displaced femoral neck fracture (HCC) Active Problems:   Fall at home, initial encounter   Preoperative clearance   Type 2 diabetes mellitus without complication, with long-term current use of insulin ([place])   CAD with Hx of CABG   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) ([place])   Anemia   Hypothyroidism   Essential hypertension   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)   Malnutrition of moderate degree  Left displaced femoral neck fracture Status post left hip hemiarthroplasty 7/25 Tolerated procedure well Postoperative pain well controlled Plan: Continue therapy evaluations DVT prophylaxis with aspirin 81 twice daily Multimodal pain regimen Bowel regimen, no BM yet Plavix restarted 7/26, monitor carefully in setting of postoperative anemia  Postoperative blood loss anemia Hemoglobin 6.3 as of 7/27 Down from 10 2 days ago Hemodynamically stable,  on room air Surgical site reassuring Plan: Transfuse 1 unit PRBC Posttransfusion H&H Goal hemoglobin 8 Monitor carefully while on antiplatelet therapy    CAD Cabg x3 in 2018. No chest pain.  Has been evaluated by cardiology for pre-op cardiac clearance and has been cleared to proceed w/ surgery Plan: Continue Plavix Continue statin Aspirin at 81 mg twice daily x4 to 6 weeks or as determined by orthopedic surgery Can resume 81 mg daily after this 4 to 6-week postoperative. Outpatient cardiology follow-up   PAD Seen by vascular earlier this year, has limb-threatening ischemia, they advised angiography, patient has declined for now. Currently LEs are warm, no claudication symptoms - aspirin, plavix, and statin as above - outpt vascular f/u   CVA Recent. No new neurologic symptoms - asa/plavix/statin as above   HTN Improved BP control - home clonidine   Insulin-dependent diabetes mellitus with hyperglycemia Here glucose elevated Likely secondary to [facility] being held on 7/25 Plan: Semglee 15 units nightly, escalate as appropriate Moderate sliding scale Nightly sliding scale Carb modified diet   Hypothyroid - home levothyroxine   DVT prophylaxis: Aspirin 81 twice daily Code Status: Full Family Communication: None today Disposition Plan: Status is: Inpatient Remains inpatient appropriate because: Hip fracture status postoperative repair.  Postoperative day #2.  Will need skilled nursing facility.  Patient agreeable.  Likely disposition in the next 48 hours if hemoglobin remains stable   Level of care: Med-Surg  Consultants:  Orthopedic surgery Cardiology  Procedures:  Left hip hemiarthroplasty 7/25  Antimicrobials: None   Subjective: Seen and examined.  Up in chair.  No visible distress.  Pain well controlled  Objective: Vitals:   05/12/22 1554 05/12/22 1949 05/13/22 0558 05/13/22 0908  BP: (!) 85/49 (!) 142/63 (!) 97/47 (!) 96/46  Pulse: 95  78 84 79   Resp: '17 17 17 18  '$ Temp: 97.8 F (36.6 C) 97.9 F (36.6 C) 99 F (37.2 C) 98.9 F (37.2 C)  TempSrc: Oral     SpO2: 100% 100% 100% 100%  Weight:      Height:        Intake/Output Summary (Last 24 hours) at 05/13/2022 1107 Last data filed at 05/12/2022 1851 Gross per 24 hour  Intake 460 ml  Output --  Net 460 ml   Filed Weights   05/10/22 2141 05/11/22 1459  Weight: 69.9 kg 69.9 kg    Examination:  General exam: No acute distress Respiratory system: Lungs clear.  Normal work of breathing.  Room air Cardiovascular system: S1-S2, RRR, no murmurs, no pedal edema Gastrointestinal system: Soft, NT/ND, normal bowel sounds Central nervous system: Alert and oriented. No focal neurological deficits. Extremities: Left hip surgical site, dressing CDI Skin: No rashes, lesions or ulcers Psychiatry: Judgement and insight appear normal. Mood & affect appropriate.     Data Reviewed: I have personally reviewed following labs and imaging studies  CBC: Recent Labs  Lab 05/10/22 2138 05/12/22 0412 05/13/22 0554  WBC 4.8 10.4 10.4  HGB 10.1* 8.3* 6.3*  HCT 31.2* 25.6* 19.3*  MCV 89.9 91.4 89.8  PLT 207 196 902*   Basic Metabolic Panel: Recent Labs  Lab 05/10/22 2138 05/12/22 0412 05/13/22 0554  NA 136 141 136  K 3.6 3.8 3.8  CL 102 105 105  CO2 '26 26 26  '$ GLUCOSE 128* 273* 133*  BUN '16 15 23  '$ CREATININE 0.78 0.85 0.89  CALCIUM 9.6 8.7* 8.3*   GFR: Estimated Creatinine Clearance: 51.7 mL/min (by C-G formula based on SCr of 0.89 mg/dL). Liver Function Tests: Recent Labs  Lab 05/10/22 2138  AST 28  ALT 18  ALKPHOS 53  BILITOT 0.7  PROT 6.4*  ALBUMIN 3.8   No results for input(s): "LIPASE", "AMYLASE" in the last 168 hours. No results for input(s): "AMMONIA" in the last 168 hours. Coagulation Profile: Recent Labs  Lab 05/10/22 2138  INR 1.1   Cardiac Enzymes: No results for input(s): "CKTOTAL", "CKMB", "CKMBINDEX", "TROPONINI" in the last 168 hours. BNP  (last 3 results) No results for input(s): "PROBNP" in the last 8760 hours. HbA1C: Recent Labs    05/10/22 2138  HGBA1C 8.8*   CBG: Recent Labs  Lab 05/12/22 1150 05/12/22 1405 05/12/22 1558 05/12/22 2215 05/13/22 0825  GLUCAP 459* 453* 361* 106* 201*   Lipid Profile: No results for input(s): "CHOL", "HDL", "LDLCALC", "TRIG", "CHOLHDL", "LDLDIRECT" in the last 72 hours. Thyroid Function Tests: No results for input(s): "TSH", "T4TOTAL", "FREET4", "T3FREE", "THYROIDAB" in the last 72 hours. Anemia Panel: No results for input(s): "VITAMINB12", "FOLATE", "FERRITIN", "TIBC", "IRON", "RETICCTPCT" in the last 72 hours. Sepsis Labs: No results for input(s): "PROCALCITON", "LATICACIDVEN" in the last 168 hours.  No results found for this or any previous visit (from the past 240 hour(s)).       Radiology Studies: DG Pelvis Portable  Result Date: 05/11/2022 CLINICAL DATA:  Postoperative left hip EXAM: PORTABLE PELVIS 1-2 VIEWS COMPARISON:  05/10/2022 FINDINGS: Interval postoperative changes with placement of a left hip hemiarthroplasty. Components appear well seated. No dislocation. Pelvis appears intact. Degenerative changes in the right hip. Skin clips and soft tissue gas are consistent with recent surgery. Vascular calcifications. IMPRESSION: Left hip hemiarthroplasty was placed. Components appear well seated. Recent postoperative changes are present. Electronically Signed   By: Lucienne Capers M.D.   On: 05/11/2022  18:01        Scheduled Meds:  aspirin EC  81 mg Oral BID   atorvastatin  40 mg Oral Daily   Chlorhexidine Gluconate Cloth  6 each Topical Daily   cloNIDine  0.2 mg Oral BID   clopidogrel  75 mg Oral Daily   feeding supplement  237 mL Oral TID BM   insulin aspart  0-15 Units Subcutaneous TID WC   insulin aspart  0-5 Units Subcutaneous QHS   insulin glargine-yfgn  15 Units Subcutaneous QHS   irbesartan  37.5 mg Oral Daily   levothyroxine  100 mcg Oral Q0600    living well with diabetes book   Does not apply Once   multivitamin with minerals  1 tablet Oral Daily   polyethylene glycol  17 g Oral Daily   senna  1 tablet Oral QHS   Continuous Infusions:  methocarbamol (ROBAXIN) IV       LOS: 3 days      Sidney Ace, MD [facility]   If 7PM-7AM, please contact night-coverage  05/13/2022, 11:07 AM

## 2022-05-13 NOTE — Plan of Care (Signed)

## 2022-05-13 NOTE — TOC Progression Note (Signed)
Transition of Care [facility]) - Progression Note    Patient Details  Name: Carrie Kane MRN: [id number] Date of Birth: 12-28-1940  Transition of Care [facility]) CM/SW [place], RN Phone Number: 05/13/2022, 9:40 AM  Clinical Narrative:    Spoke to the patient and reviewed the bed offers, she chose [facility] I notified Magda Paganini at [place] will be requested        Expected Discharge Plan and Services                                                 Social Determinants of Health (SDOH) Interventions    Readmission Risk Interventions     No data to display

## 2022-05-13 NOTE — TOC Progression Note (Signed)
Transition of Care [facility]) - Progression Note    Patient Details  Name: Carrie Kane MRN: [id number] Date of Birth: 08/29/1941  Transition of Care [facility]) CM/SW [place], RN Phone Number: 05/13/2022, 4:16 PM  Clinical Narrative:     INS approved to go to [facility] 7/28-8/1 ref number [id number] can DC tomorrow       Expected Discharge Plan and Services                                                 Social Determinants of Health (SDOH) Interventions    Readmission Risk Interventions     No data to display

## 2022-05-13 NOTE — Progress Notes (Signed)
Physical Therapy Treatment Patient Details Name: Carrie Kane MRN: [id number] DOB: 1941/08/08 Today's Date: 05/13/2022   History of Present Illness Carrie Kane is a 53yoF who comes [facility] on 05/10/22 after a fall onto her Left hip. S/p L hemiarthoplasty on 05/11/22. PMH: CVA, HTN, HLD, DM2, PAD s/p angiopasty.    PT Comments    Upon chart review this morning, BG improved, but MAP is 62 and Hb now in low 6s. Pt already up to chair, feeling good this morning, agreeable to review of some new exercises. Generally good tolerance with LLE exercises in chair. Still needs heavy minA to rise to standing, but is able to balance with RW. Slight dizziness upon standing not surprising given MAP and ABLA. Additional transfers and AMB training postponed as not really appropriate at this time.    Recommendations for follow up therapy are one component of a multi-disciplinary discharge planning process, led by the attending physician.  Recommendations may be updated based on patient status, additional functional criteria and insurance authorization.  Follow Up Recommendations  Skilled nursing-short term rehab (<3 hours/day) Can patient physically be transported by private vehicle: No   Assistance Recommended at Discharge Set up Supervision/Assistance  Patient can return home with the following A little help with walking and/or transfers;A little help with bathing/dressing/bathroom;Assistance with cooking/housework;Assist for transportation;Help with stairs or ramp for entrance   Equipment Recommendations  Rolling walker (2 wheels)    Recommendations for Other Services       Precautions / Restrictions Precautions Precautions: Fall;Posterior Hip Restrictions LLE Weight Bearing: Weight bearing as tolerated     Mobility  Bed Mobility               General bed mobility comments: already up to chair    Transfers Overall transfer level: Needs assistance Equipment used: Rolling walker (2  wheels) Transfers: Sit to/from Stand Sit to Stand: Min assist, From elevated surface           General transfer comment: able to stand and balacne briefly while linen is adjusted.    Ambulation/Gait Ambulation/Gait assistance:  (defered due to hypotension and ABLA)                 Stairs             Wheelchair Mobility    Modified Rankin (Stroke Patients Only)       Balance                                            Cognition Arousal/Alertness: Awake/alert Behavior During Therapy: WFL for tasks assessed/performed Overall Cognitive Status: Within Functional Limits for tasks assessed                                          Exercises Total Joint Exercises Short Arc Quad: AAROM, Left, 10 reps, Supine Heel Slides: AAROM, Left, 10 reps, Supine Hip ABduction/ADduction: AAROM, Left, 10 reps, Supine    General Comments        Pertinent Vitals/Pain Pain Assessment Pain Assessment: 0-10 Pain Score: 6  Pain Location: L operative hip Pain Descriptors / Indicators: Operative site guarding Pain Intervention(s): Limited activity within patient's tolerance, Monitored during session, Premedicated before session    Home Living  Prior Function            PT Goals (current goals can now be found in the care plan section) Acute Rehab PT Goals Patient Stated Goal: regain independent AMB PT Goal Formulation: With patient Time For Goal Achievement: 05/26/22 Potential to Achieve Goals: Good Progress towards PT goals: Not progressing toward goals - comment    Frequency    BID      PT Plan Current plan remains appropriate    Co-evaluation              AM-PAC PT "6 Clicks" Mobility   Outcome Measure  Help needed turning from your back to your side while in a flat bed without using bedrails?: A Lot Help needed moving from lying on your back to sitting on the side of a flat  bed without using bedrails?: A Lot Help needed moving to and from a bed to a chair (including a wheelchair)?: A Lot Help needed standing up from a chair using your arms (e.g., wheelchair or bedside chair)?: A Lot Help needed to walk in hospital room?: Total Help needed climbing 3-5 steps with a railing? : Total 6 Click Score: 10    End of Session   Activity Tolerance: Patient tolerated treatment well;Treatment limited secondary to medical complications (Comment) Patient left: in chair;with call bell/phone within reach Nurse Communication: Mobility status PT Visit Diagnosis: Difficulty in walking, not elsewhere classified (R26.2);Unsteadiness on feet (R26.81);Muscle weakness (generalized) (M62.81)     Time: 0920-0950 PT Time Calculation (min) (ACUTE ONLY): 30 min  Charges:  $Therapeutic Exercise: 23-37 mins                    11:33 AM, 05/13/22 Etta Grandchild, PT, DPT Physical Therapist - [facility]  [phone] ([place])    Carrie Kane 05/13/2022, 11:31 AM

## 2022-05-13 NOTE — Progress Notes (Signed)
Subjective:  Sitting in bedside chair. Pain 5/10. Currently comfortable. No dizziness or nausea.  Objective:   VITALS:   Vitals:   05/12/22 1554 05/12/22 1949 05/13/22 0558 05/13/22 0908  BP: (!) 85/49 (!) 142/63 (!) 97/47 (!) 96/46  Pulse: 95 78 84 79  Resp: '17 17 17 18  '$ Temp: 97.8 F (36.6 C) 97.9 F (36.6 C) 99 F (37.2 C) 98.9 F (37.2 C)  TempSrc: Oral     SpO2: 100% 100% 100% 100%  Weight:      Height:        PHYSICAL EXAM:  General: Alert, comfortable  Left lower extremity: Dressing is clean dry and intact, wound VAC intact, left lower extremity is grossly neurovascular intact  LABS  Results for orders placed or performed during the hospital encounter of 05/10/22 (from the past 24 hour(s))  Glucose, capillary     Status: Abnormal   Collection Time: 05/12/22  2:05 PM  Result Value Ref Range   Glucose-Capillary 453 (H) 70 - 99 mg/dL  Glucose, capillary     Status: Abnormal   Collection Time: 05/12/22  3:58 PM  Result Value Ref Range   Glucose-Capillary 361 (H) 70 - 99 mg/dL  Glucose, capillary     Status: Abnormal   Collection Time: 05/12/22 10:15 PM  Result Value Ref Range   Glucose-Capillary 106 (H) 70 - 99 mg/dL  CBC     Status: Abnormal   Collection Time: 05/13/22  5:54 AM  Result Value Ref Range   WBC 10.4 4.0 - 10.5 K/uL   RBC 2.15 (L) 3.87 - 5.11 MIL/uL   Hemoglobin 6.3 (L) 12.0 - 15.0 g/dL   HCT 19.3 (L) 36.0 - 46.0 %   MCV 89.8 80.0 - 100.0 fL   MCH 29.3 26.0 - 34.0 pg   MCHC 32.6 30.0 - 36.0 g/dL   RDW 14.5 11.5 - 15.5 %   Platelets 140 (L) 150 - 400 K/uL   nRBC 0.0 0.0 - 0.2 %  Basic metabolic panel     Status: Abnormal   Collection Time: 05/13/22  5:54 AM  Result Value Ref Range   Sodium 136 135 - 145 mmol/L   Potassium 3.8 3.5 - 5.1 mmol/L   Chloride 105 98 - 111 mmol/L   CO2 26 22 - 32 mmol/L   Glucose, Bld 133 (H) 70 - 99 mg/dL   BUN 23 8 - 23 mg/dL   Creatinine, Ser 0.89 0.44 - 1.00 mg/dL   Calcium 8.3 (L) 8.9 - 10.3 mg/dL    GFR, Estimated >60 >60 mL/min   Anion gap 5 5 - 15  Prepare RBC (crossmatch)     Status: None   Collection Time: 05/13/22  8:00 AM  Result Value Ref Range   Order Confirmation      ORDER PROCESSED BY BLOOD BANK Performed at [facility], [address] [place], [place] [address]   Glucose, capillary     Status: Abnormal   Collection Time: 05/13/22  8:25 AM  Result Value Ref Range   Glucose-Capillary 201 (H) 70 - 99 mg/dL  Glucose, capillary     Status: Abnormal   Collection Time: 05/13/22 12:34 PM  Result Value Ref Range   Glucose-Capillary 305 (H) 70 - 99 mg/dL    DG Pelvis Portable  Result Date: 05/11/2022 CLINICAL DATA:  Postoperative left hip EXAM: PORTABLE PELVIS 1-2 VIEWS COMPARISON:  05/10/2022 FINDINGS: Interval postoperative changes with placement of a left hip hemiarthroplasty. Components appear well seated. No  dislocation. Pelvis appears intact. Degenerative changes in the right hip. Skin clips and soft tissue gas are consistent with recent surgery. Vascular calcifications. IMPRESSION: Left hip hemiarthroplasty was placed. Components appear well seated. Recent postoperative changes are present. Electronically Signed   By: Lucienne Capers M.D.   On: 05/11/2022 18:01    Assessment/Plan: 2 Days Post-Op  Status post left hip hemiarthroplasty  -Appreciate multidisciplinary care/management -Continue to monitor H/H and transfuse as necessary -PT/OT: Weight-bear as tolerated, posterior hip precautions -DVT prophylaxis: ASA BID -Prevena dressing/wound VAC to remain while inpatient, can transition to portable Prevena canister upon discharge -Follow-up in clinic in approximately 2 weeks for staple removal and x-rays   Renee Harder , MD 05/13/2022, 12:51 PM

## 2022-05-13 NOTE — Care Management Important Message (Signed)
Important Message  Patient Details  Name: Carrie Kane MRN: [id number] Date of Birth: May 06, 1941   Medicare Important Message Given:  Yes     Carrie Kane 05/13/2022, 11:27 AM

## 2022-05-13 NOTE — Plan of Care (Signed)

## 2022-05-13 NOTE — Progress Notes (Signed)
PT Cancellation Note  Patient Details Name: Carrie Kane MRN: [id number] DOB: April 19, 1941   Cancelled Treatment:    Reason Eval/Treat Not Completed: Medical issues which prohibited therapy (Per RN, pt has not received PRBC. Will hold until ABLA plan has been addressed. Will resume PT services next day.)  2:37 PM, 05/13/22 Etta Grandchild, PT, DPT Physical Therapist - [facility]  [phone] ([place])     [place] [facility] 05/13/2022, 2:37 PM

## 2022-05-13 NOTE — Progress Notes (Signed)
OT Cancellation Note  Patient Details Name: Carrie Kane MRN: [id number] DOB: 1940/11/04   Cancelled Treatment:    Reason Eval/Treat Not Completed: Medical issues which prohibited therapy. Chart reviewed, HGB 6.3, pending blood transfusion. Will hold this AM, re-attempt as able.   [employer], OTDS   [employer] 05/13/2022, 9:00 AM

## 2022-05-14 DIAGNOSIS — S72002A Fracture of unspecified part of neck of left femur, initial encounter for closed fracture: Secondary | ICD-10-CM | POA: Diagnosis not present

## 2022-05-14 LAB — CBC
HCT: 22.7 % — ABNORMAL LOW (ref 36.0–46.0)
Hemoglobin: 7.6 g/dL — ABNORMAL LOW (ref 12.0–15.0)
MCH: 30.5 pg (ref 26.0–34.0)
MCHC: 33.5 g/dL (ref 30.0–36.0)
MCV: 91.2 fL (ref 80.0–100.0)
Platelets: 138 10*3/uL — ABNORMAL LOW (ref 150–400)
RBC: 2.49 MIL/uL — ABNORMAL LOW (ref 3.87–5.11)
RDW: 14.2 % (ref 11.5–15.5)
WBC: 9.7 10*3/uL (ref 4.0–10.5)
nRBC: 0 % (ref 0.0–0.2)

## 2022-05-14 LAB — TYPE AND SCREEN
ABO/RH(D): A POS
Antibody Screen: NEGATIVE
Unit division: 0

## 2022-05-14 LAB — GLUCOSE, CAPILLARY
Glucose-Capillary: 128 mg/dL — ABNORMAL HIGH (ref 70–99)
Glucose-Capillary: 113 mg/dL — ABNORMAL HIGH (ref 70–99)
Glucose-Capillary: 192 mg/dL — ABNORMAL HIGH (ref 70–99)

## 2022-05-14 LAB — BPAM RBC
Blood Product Expiration Date: 202308242359
ISSUE DATE / TIME: 202307271451
Unit Type and Rh: 6200

## 2022-05-14 LAB — BASIC METABOLIC PANEL
Anion gap: 7 (ref 5–15)
BUN: 34 mg/dL — ABNORMAL HIGH (ref 8–23)
CO2: 27 mmol/L (ref 22–32)
Calcium: 8.5 mg/dL — ABNORMAL LOW (ref 8.9–10.3)
Chloride: 105 mmol/L (ref 98–111)
Creatinine, Ser: 1.17 mg/dL — ABNORMAL HIGH (ref 0.44–1.00)
GFR, Estimated: 47 mL/min — ABNORMAL LOW (ref >60)
Glucose, Bld: 111 mg/dL — ABNORMAL HIGH (ref 70–99)
Potassium: 3.7 mmol/L (ref 3.5–5.1)
Sodium: 139 mmol/L (ref 135–145)

## 2022-05-14 LAB — PREPARE RBC (CROSSMATCH)

## 2022-05-14 MED ORDER — SODIUM CHLORIDE 0.9% IV SOLUTION: Freq: Once | INTRAVENOUS | Status: AC

## 2022-05-14 MED ORDER — HYDROCODONE-ACETAMINOPHEN 5-325 MG PO TABS: 1.0000 | ORAL_TABLET | Freq: Four times a day (QID) | ORAL | 0 refills | Status: AC | PRN

## 2022-05-14 NOTE — Progress Notes (Signed)
Physical Therapy Treatment Patient Details Name: Carrie Kane MRN: [id number] DOB: 11/13/1940 Today's Date: 05/14/2022   History of Present Illness Carrie Kane is a 34yoF who comes [facility] on 05/10/22 after a fall onto her Left hip. S/p L hemiarthoplasty on 05/11/22. PMH: CVA, HTN, HLD, DM2, PAD s/p angiopasty.    PT Comments    Pt in chair on entry, Hb remains low but improved, now in 7s. Pt agreeable to session. Appetite still not present as a toast stares down at pt from near. WEnt through some leg exercises in chair to begin, pt easily advanced to 15 reps today. IV team arrives to place new access, hence session is abbreviated, pt assisted back to bed upon request from provider, minA to stand, slow tricky acquisition of balance, then safe and steady use of RW for SPT back to EOB, minA to supine.     Recommendations for follow up therapy are one component of a multi-disciplinary discharge planning process, led by the attending physician.  Recommendations may be updated based on patient status, additional functional criteria and insurance authorization.  Follow Up Recommendations  Skilled nursing-short term rehab (<3 hours/day) Can patient physically be transported by private vehicle: No   Assistance Recommended at Discharge Set up Supervision/Assistance  Patient can return home with the following A little help with walking and/or transfers;A little help with bathing/dressing/bathroom;Assistance with cooking/housework;Assist for transportation;Help with stairs or ramp for entrance   Equipment Recommendations  Rolling walker (2 wheels)    Recommendations for Other Services       Precautions / Restrictions Precautions Precautions: Fall;Posterior Hip Restrictions Weight Bearing Restrictions: Yes LLE Weight Bearing: Weight bearing as tolerated     Mobility  Bed Mobility Overal bed mobility: Needs Assistance Bed Mobility: Sit to Supine       Sit to supine: Min assist    General bed mobility comments: minA of operative limb for weakness and maintaining precautions    Transfers Overall transfer level: Needs assistance Equipment used: Rolling walker (2 wheels) Transfers: Sit to/from Stand Sit to Stand: Min assist, From elevated surface Stand pivot transfers: Min assist   Squat pivot transfers: Min guard, Supervision     General transfer comment: struggles with cues for hand placement and sequencing still    Ambulation/Gait                   Stairs             Wheelchair Mobility    Modified Rankin (Stroke Patients Only)       Balance                                            Cognition Arousal/Alertness: Awake/alert Behavior During Therapy: WFL for tasks assessed/performed Overall Cognitive Status: Within Functional Limits for tasks assessed                                          Exercises Total Joint Exercises Short Arc Quad: Left, 10 reps, Supine, AROM Heel Slides: AAROM, Left, 10 reps, Supine Hip ABduction/ADduction: AAROM, Supine, Both, 15 reps Marching in Standing: AROM, Right, 10 reps, Supine General Exercises - Lower Extremity Hip Flexion/Marching: AROM    General Comments        Pertinent Vitals/Pain Pain Assessment  Pain Assessment: 0-10 Pain Score: 5  Pain Location: L operative hip Pain Descriptors / Indicators: Operative site guarding Pain Intervention(s): Limited activity within patient's tolerance, Monitored during session    Home Living                          Prior Function            PT Goals (current goals can now be found in the care plan section) Acute Rehab PT Goals Patient Stated Goal: regain independent AMB PT Goal Formulation: With patient Time For Goal Achievement: 05/26/22 Potential to Achieve Goals: Good Progress towards PT goals: Progressing toward goals    Frequency    BID      PT Plan Current plan remains  appropriate    Co-evaluation              AM-PAC PT "6 Clicks" Mobility   Outcome Measure  Help needed turning from your back to your side while in a flat bed without using bedrails?: A Lot Help needed moving from lying on your back to sitting on the side of a flat bed without using bedrails?: A Lot Help needed moving to and from a bed to a chair (including a wheelchair)?: A Lot Help needed standing up from a chair using your arms (e.g., wheelchair or bedside chair)?: A Lot Help needed to walk in hospital room?: A Lot Help needed climbing 3-5 steps with a railing? : Total 6 Click Score: 11    End of Session   Activity Tolerance: Patient tolerated treatment well;No increased pain Patient left: with call bell/phone within reach;in bed;with nursing/sitter in room Nurse Communication: Mobility status PT Visit Diagnosis: Difficulty in walking, not elsewhere classified (R26.2);Unsteadiness on feet (R26.81);Muscle weakness (generalized) (M62.81)     Time: 8832-5498 PT Time Calculation (min) (ACUTE ONLY): 17 min  Charges:  $Therapeutic Exercise: 8-22 mins                    11:16 AM, 05/14/22 Etta Grandchild, PT, DPT Physical Therapist - [place] [facility] 05/14/2022, 11:11 AM

## 2022-05-14 NOTE — TOC Progression Note (Signed)
Transition of Care [facility]) - Progression Note    Patient Details  Name: Carrie Kane MRN: [id number] Date of Birth: 1941/10/13  Transition of Care [facility]) CM/SW [place], RN Phone Number: 05/14/2022, 2:12 PM  Clinical Narrative:     Spoke with the patients husband Carrie Kane and let him know that the patient is going to go to [facility] called and patient placed on transportation list        Expected Discharge Plan and Services           Expected Discharge Date: 05/14/22                                     Social Determinants of Health (SDOH) Interventions    Readmission Risk Interventions     No data to display

## 2022-05-14 NOTE — Progress Notes (Signed)
OT Cancellation Note  Patient Details Name: Carrie Kane MRN: [id number] DOB: September 12, 1941   Cancelled Treatment:    Reason Eval/Treat Not Completed: Patient declined, no reason specified. Chart reviewed. Upon arrival pt in bed, reports having just returned. Defers all activity at this time, will re-attempt as able.   Dessie Coma, M.S. OTR/L  05/14/22, 10:47 AM  ascom [phone]

## 2022-05-14 NOTE — Plan of Care (Signed)

## 2022-05-14 NOTE — Plan of Care (Signed)
Problem: Education: Goal: Knowledge of General Education information will improve Description: Including pain rating scale, medication(s)/side effects and non-pharmacologic comfort measures 05/14/2022 1114 by Gwendel Hanson, LPN Outcome: Adequate for Discharge 05/14/2022 1010 by Gwendel Hanson, LPN Outcome: Progressing   Problem: Health Behavior/Discharge Planning: Goal: Ability to manage health-related needs will improve 05/14/2022 1114 by Gwendel Hanson, LPN Outcome: Adequate for Discharge 05/14/2022 1010 by Gwendel Hanson, LPN Outcome: Progressing   Problem: Clinical Measurements: Goal: Ability to maintain clinical measurements within normal limits will improve 05/14/2022 1114 by Gwendel Hanson, LPN Outcome: Adequate for Discharge 05/14/2022 1010 by Gwendel Hanson, LPN Outcome: Progressing Goal: Will remain free from infection 05/14/2022 1114 by Gwendel Hanson, LPN Outcome: Adequate for Discharge 05/14/2022 1010 by Gwendel Hanson, LPN Outcome: Progressing Goal: Diagnostic test results will improve 05/14/2022 1114 by Gwendel Hanson, LPN Outcome: Adequate for Discharge 05/14/2022 1010 by Gwendel Hanson, LPN Outcome: Progressing Goal: Respiratory complications will improve 05/14/2022 1114 by Gwendel Hanson, LPN Outcome: Adequate for Discharge 05/14/2022 1010 by Gwendel Hanson, LPN Outcome: Progressing Goal: Cardiovascular complication will be avoided 05/14/2022 1114 by Gwendel Hanson, LPN Outcome: Adequate for Discharge 05/14/2022 1010 by Gwendel Hanson, LPN Outcome: Progressing   Problem: Activity: Goal: Risk for activity intolerance will decrease 05/14/2022 1114 by Gwendel Hanson, LPN Outcome: Adequate for Discharge 05/14/2022 1010 by Gwendel Hanson, LPN Outcome: Progressing   Problem: Nutrition: Goal: Adequate nutrition will be maintained 05/14/2022 1114 by Gwendel Hanson, LPN Outcome: Adequate for Discharge 05/14/2022 1010 by Gwendel Hanson, LPN Outcome: Progressing    Problem: Coping: Goal: Level of anxiety will decrease 05/14/2022 1114 by Gwendel Hanson, LPN Outcome: Adequate for Discharge 05/14/2022 1010 by Gwendel Hanson, LPN Outcome: Progressing   Problem: Elimination: Goal: Will not experience complications related to bowel motility 05/14/2022 1114 by Gwendel Hanson, LPN Outcome: Adequate for Discharge 05/14/2022 1010 by Gwendel Hanson, LPN Outcome: Progressing Goal: Will not experience complications related to urinary retention 05/14/2022 1114 by Gwendel Hanson, LPN Outcome: Adequate for Discharge 05/14/2022 1010 by Gwendel Hanson, LPN Outcome: Progressing   Problem: Pain Managment: Goal: General experience of comfort will improve 05/14/2022 1114 by Gwendel Hanson, LPN Outcome: Adequate for Discharge 05/14/2022 1010 by Gwendel Hanson, LPN Outcome: Progressing   Problem: Safety: Goal: Ability to remain free from injury will improve 05/14/2022 1114 by Gwendel Hanson, LPN Outcome: Adequate for Discharge 05/14/2022 1010 by Gwendel Hanson, LPN Outcome: Progressing   Problem: Skin Integrity: Goal: Risk for impaired skin integrity will decrease 05/14/2022 1114 by Gwendel Hanson, LPN Outcome: Adequate for Discharge 05/14/2022 1010 by Gwendel Hanson, LPN Outcome: Progressing   Problem: Education: Goal: Ability to describe self-care measures that may prevent or decrease complications (Diabetes Survival Skills Education) will improve 05/14/2022 1114 by Gwendel Hanson, LPN Outcome: Adequate for Discharge 05/14/2022 1010 by Gwendel Hanson, LPN Outcome: Progressing Goal: Individualized Educational Video(s) 05/14/2022 1114 by Gwendel Hanson, LPN Outcome: Adequate for Discharge 05/14/2022 1010 by Gwendel Hanson, LPN Outcome: Progressing   Problem: Coping: Goal: Ability to adjust to condition or change in health will improve 05/14/2022 1114 by Gwendel Hanson, LPN Outcome: Adequate for Discharge 05/14/2022 1010 by Gwendel Hanson, LPN Outcome:  Progressing   Problem: Fluid Volume: Goal: Ability to maintain a balanced intake and output will improve 05/14/2022 1114 by Gwendel Hanson, LPN Outcome: Adequate for Discharge 05/14/2022 1010 by Madelyn Flavors,  [place], LPN Outcome: Progressing   Problem: Health Behavior/Discharge Planning: Goal: Ability to identify and utilize available resources and services will improve 05/14/2022 1114 by Gwendel Hanson, LPN Outcome: Adequate for Discharge 05/14/2022 1010 by Gwendel Hanson, LPN Outcome: Progressing Goal: Ability to manage health-related needs will improve 05/14/2022 1114 by Gwendel Hanson, LPN Outcome: Adequate for Discharge 05/14/2022 1010 by Gwendel Hanson, LPN Outcome: Progressing   Problem: Metabolic: Goal: Ability to maintain appropriate glucose levels will improve 05/14/2022 1114 by Gwendel Hanson, LPN Outcome: Adequate for Discharge 05/14/2022 1010 by Gwendel Hanson, LPN Outcome: Progressing   Problem: Nutritional: Goal: Maintenance of adequate nutrition will improve 05/14/2022 1114 by Gwendel Hanson, LPN Outcome: Adequate for Discharge 05/14/2022 1010 by Gwendel Hanson, LPN Outcome: Progressing Goal: Progress toward achieving an optimal weight will improve 05/14/2022 1114 by Gwendel Hanson, LPN Outcome: Adequate for Discharge 05/14/2022 1010 by Gwendel Hanson, LPN Outcome: Progressing   Problem: Skin Integrity: Goal: Risk for impaired skin integrity will decrease 05/14/2022 1114 by Gwendel Hanson, LPN Outcome: Adequate for Discharge 05/14/2022 1010 by Gwendel Hanson, LPN Outcome: Progressing   Problem: Tissue Perfusion: Goal: Adequacy of tissue perfusion will improve 05/14/2022 1114 by Gwendel Hanson, LPN Outcome: Adequate for Discharge 05/14/2022 1010 by Gwendel Hanson, LPN Outcome: Progressing

## 2022-05-14 NOTE — Discharge Summary (Signed)
Physician Discharge Summary  Carrie Kane UEA:[id number] DOB: November 23, 1940 DOA: 05/10/2022  PCP: Cletis Athens, MD  Admit date: 05/10/2022 Discharge date: 05/14/2022  Admitted From: Home Disposition:  SNF  Recommendations for Outpatient Follow-up:  Follow up with PCP in 1-2 weeks Follow up ortho 2 weeks  Home Health:No Equipment/Devices:Provena wound dressing   Discharge Condition:Stable  CODE STATUS:FULL  Diet recommendation: Reg  Brief/Interim Summary: 81 y.o. female with medical history significant for CAD status post coronary artery bypass grafting in 2018, stroke in May 2023 without residual deficits, hypertension, hyperlipidemia, insulin-dependent diabetes, peripheral arterial disease s/p revascularization 2022 with recent recommendation for repeat procedure due to intermittent claudication, and diastolic dysfunction, who presents to the ED following a fall from a standing position.  She denies preceding headache, visual disturbance, one-sided numbness tingling or weakness in the face or extremities and denied preceding lightheadedness, palpitations, chest pain or shortness of breath.   Status post hemiarthroplasty for left femoral neck fracture on 7/25.  Tolerated procedure well.  Postoperative pain well controlled.   Postoperative blood loss anemia noted.  Hemoglobin 6.3 on 7/27  Hemoglobin 7.6 on 7/28.  No blood loss.  Hemodynamically stable.  On room air.  Will transfuse 1 additional unit PRBC.  Patient stable for discharge at this time.  Follow-up outpatient PCP and orthopedics.  Orthopedic surgery follow-up in 2 weeks.    Discharge Diagnoses:  Principal Problem:   Left displaced femoral neck fracture (HCC) Active Problems:   Fall at home, initial encounter   Preoperative clearance   Type 2 diabetes mellitus without complication, with long-term current use of insulin (HCC)   CAD with Hx of CABG   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) ([place])    Anemia   Hypothyroidism   Essential hypertension   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)   Malnutrition of moderate degree  Left displaced femoral neck fracture Status post left hip hemiarthroplasty 7/25 Tolerated procedure well Postoperative pain well controlled Plan: Pain well controlled.  Stable for discharge to skilled nursing facility.  Aspirin 81 twice daily x4 to 6 weeks for DVT prophylaxis.  Multimodal pain regimen.  Follow-up outpatient orthopedic surgery 2 weeks   Postoperative blood loss anemia Hemoglobin 6.3 as of 7/27 Down from 10 2 days ago Hemodynamically stable, on room air Surgical site reassuring Plan: Hemoglobin 7.6 at time of discharge.  Will transfuse additional 1 unit PRBC.  Stable for discharge after transfusion.  Follow-up outpatient PCP within 1 to 2 weeks for repeat lab work     CAD Cabg x3 in 2018. No chest pain.  Has been evaluated by cardiology for pre-op cardiac clearance and has been cleared to proceed w/ surgery Plan: Continue Plavix Continue statin Aspirin at 81 mg twice daily x4 to 6 weeks or as determined by orthopedic surgery Can resume prior aspirin regimen after this date.   PAD Seen by vascular earlier this year, has limb-threatening ischemia, they advised angiography, patient has declined for now. Currently LEs are warm, no claudication symptoms - aspirin, plavix, and statin as above - outpt vascular f/u   CVA Recent. No new neurologic symptoms - asa/plavix/statin as above   HTN Improved BP control - home clonidine  Discharge Instructions  Discharge Instructions     Diet - low sodium heart healthy   Complete by: As directed    Increase activity slowly   Complete by: As directed    No wound care   Complete by: As directed       Allergies  as of 05/14/2022       Reactions   Shellfish Allergy Anaphylaxis   Throat swelling   Flexeril [cyclobenzaprine] Hypertension        Medication List     TAKE these  medications    aspirin EC 81 MG tablet Take 81 mg by mouth 2 (two) times daily.   atorvastatin 40 MG tablet Commonly known as: LIPITOR Take 1 tablet (40 mg total) by mouth daily.   BD Insulin Syringe U/F 30G X 1/2" 0.5 ML Misc Generic drug: Insulin Syringe-Needle U-100   cholecalciferol 1000 units tablet Commonly known as: VITAMIN D Take 2,000 Units by mouth daily at 12 noon.   cloNIDine 0.2 MG tablet Commonly known as: CATAPRES TAKE 1 TABLET TWICE A DAY   clopidogrel 75 MG tablet Commonly known as: PLAVIX Take 1 tablet (75 mg total) by mouth daily.   ferrous sulfate 325 (65 FE) MG EC tablet Take 1 tablet (325 mg total) by mouth daily at 2 PM.   HumaLOG KwikPen 100 UNIT/ML KwikPen Generic drug: insulin lispro INJECT 38 UNITS UNDER THE SKIN DAILY What changed: See the new instructions.   HumaLOG Mix 75/25 KwikPen (75-25) 100 UNIT/ML Kwikpen Generic drug: Insulin Lispro Prot & Lispro Inject 45 Units into the skin daily at 2 PM.   hydrochlorothiazide 12.5 MG capsule Commonly known as: MICROZIDE Take 12.5 mg by mouth daily.   HYDROcodone-acetaminophen 5-325 MG tablet Commonly known as: NORCO/VICODIN Take 1-2 tablets by mouth every 6 (six) hours as needed for up to 3 days for moderate pain. SNF use only.  Refills to be considered by SNF providers   insulin glargine 100 UNIT/ML injection Commonly known as: Lantus INJECT 45 UNITS UNDER THE SKIN AT BEDTIME What changed:  how much to take when to take this additional instructions   levothyroxine 100 MCG tablet Commonly known as: SYNTHROID TAKE 1 TABLET DAILY BEFORE BREAKFAST   multivitamin capsule Take 1 capsule by mouth daily at 12 noon.   nitroGLYCERIN 0.4 MG SL tablet Commonly known as: Nitrostat Place 1 tablet (0.4 mg total) under the tongue every 5 (five) minutes as needed for chest pain.   SYSTANE OP Apply 1 drop to eye as needed (dry eyes).   valsartan 320 MG tablet Commonly known as: Diovan Take 1  tablet (320 mg total) by mouth daily.        Contact information for after-discharge care     [facility] Preferred SNF .   Service: Skilled Nursing Contact information: [address] [place] [phone]                    Allergies  Allergen Reactions   Shellfish Allergy Anaphylaxis    Throat swelling   Flexeril [Cyclobenzaprine] Hypertension    Consultations: Orthopedic surgery   Procedures/Studies: DG Pelvis Portable  Result Date: 05/11/2022 CLINICAL DATA:  Postoperative left hip EXAM: PORTABLE PELVIS 1-2 VIEWS COMPARISON:  05/10/2022 FINDINGS: Interval postoperative changes with placement of a left hip hemiarthroplasty. Components appear well seated. No dislocation. Pelvis appears intact. Degenerative changes in the right hip. Skin clips and soft tissue gas are consistent with recent surgery. Vascular calcifications. IMPRESSION: Left hip hemiarthroplasty was placed. Components appear well seated. Recent postoperative changes are present. Electronically Signed   By: Lucienne Capers M.D.   On: 05/11/2022 18:01   CT HEAD WO CONTRAST (5MM)  Result Date: 05/11/2022 CLINICAL  DATA:  Fall EXAM: CT HEAD WITHOUT CONTRAST CT CERVICAL SPINE WITHOUT CONTRAST TECHNIQUE: Multidetector CT imaging of the head and cervical spine was performed following the standard protocol without intravenous contrast. Multiplanar CT image reconstructions of the cervical spine were also generated. RADIATION DOSE REDUCTION: This exam was performed according to the departmental dose-optimization program which includes automated exposure control, adjustment of the mA and/or kV according to patient size and/or use of iterative reconstruction technique. COMPARISON:  MR head/neck dated 02/24/2022 FINDINGS: CT HEAD FINDINGS Brain: No evidence of acute infarction, hemorrhage,  hydrocephalus, extra-axial collection or mass lesion/mass effect. Subcortical white matter and periventricular small vessel ischemic changes. Vascular: Intracranial atherosclerosis. Skull: Normal. Negative for fracture or focal lesion. Sinuses/Orbits: The visualized paranasal sinuses are essentially clear. The mastoid air cells are unopacified. Other: None. CT CERVICAL SPINE FINDINGS Alignment: Reversal of the normal cervical lordosis, likely positional. Skull base and vertebrae: No acute fracture. No primary bone lesion or focal pathologic process. Soft tissues and spinal canal: No prevertebral fluid or swelling. No visible canal hematoma. Disc levels: Mild degenerative changes of the mid cervical spine. Spinal canal is patent. Upper chest: Visualized lung apices are clear. Other: Visualized thyroid is unremarkable. IMPRESSION: No evidence of acute intracranial abnormality. Small vessel ischemic changes. No evidence of acute traumatic injury to the cervical spine. Mild degenerative changes. Electronically Signed   By: Julian Hy M.D.   On: 05/11/2022 01:34   CT CERVICAL SPINE WO CONTRAST  Result Date: 05/11/2022 CLINICAL DATA:  Fall EXAM: CT HEAD WITHOUT CONTRAST CT CERVICAL SPINE WITHOUT CONTRAST TECHNIQUE: Multidetector CT imaging of the head and cervical spine was performed following the standard protocol without intravenous contrast. Multiplanar CT image reconstructions of the cervical spine were also generated. RADIATION DOSE REDUCTION: This exam was performed according to the departmental dose-optimization program which includes automated exposure control, adjustment of the mA and/or kV according to patient size and/or use of iterative reconstruction technique. COMPARISON:  MR head/neck dated 02/24/2022 FINDINGS: CT HEAD FINDINGS Brain: No evidence of acute infarction, hemorrhage, hydrocephalus, extra-axial collection or mass lesion/mass effect. Subcortical white matter and periventricular small  vessel ischemic changes. Vascular: Intracranial atherosclerosis. Skull: Normal. Negative for fracture or focal lesion. Sinuses/Orbits: The visualized paranasal sinuses are essentially clear. The mastoid air cells are unopacified. Other: None. CT CERVICAL SPINE FINDINGS Alignment: Reversal of the normal cervical lordosis, likely positional. Skull base and vertebrae: No acute fracture. No primary bone lesion or focal pathologic process. Soft tissues and spinal canal: No prevertebral fluid or swelling. No visible canal hematoma. Disc levels: Mild degenerative changes of the mid cervical spine. Spinal canal is patent. Upper chest: Visualized lung apices are clear. Other: Visualized thyroid is unremarkable. IMPRESSION: No evidence of acute intracranial abnormality. Small vessel ischemic changes. No evidence of acute traumatic injury to the cervical spine. Mild degenerative changes. Electronically Signed   By: Julian Hy M.D.   On: 05/11/2022 01:34   DG HIP UNILAT WITH PELVIS 2-3 VIEWS LEFT  Result Date: 05/10/2022 CLINICAL DATA:  [id number].  Fall bilateral hip pain. EXAM: DG HIP (WITH OR WITHOUT PELVIS) 2-3V LEFT; DG HIP (WITH OR WITHOUT PELVIS) 2-3V RIGHT COMPARISON:  None Available. FINDINGS: Acute superiorly displaced left femoral neck fracture. No left hip dislocation. There is no evidence of hip fracture or dislocation right hip. No acute displaced fracture or diastasis of the bones of the pelvis. There is no evidence of severe arthropathy or other focal bone abnormality. Vascular calcifications. IMPRESSION: 1. Acute superiorly displaced left femoral  neck fracture. 2. No acute displaced fracture or dislocation of the right hip. 3. No acute displaced fracture or diastasis of the bones of the pelvis. Electronically Signed   By: Iven Finn M.D.   On: 05/10/2022 22:17   DG HIP UNILAT WITH PELVIS 2-3 VIEWS RIGHT  Result Date: 05/10/2022 CLINICAL DATA:  [id number].  Fall bilateral hip pain. EXAM: DG HIP (WITH  OR WITHOUT PELVIS) 2-3V LEFT; DG HIP (WITH OR WITHOUT PELVIS) 2-3V RIGHT COMPARISON:  None Available. FINDINGS: Acute superiorly displaced left femoral neck fracture. No left hip dislocation. There is no evidence of hip fracture or dislocation right hip. No acute displaced fracture or diastasis of the bones of the pelvis. There is no evidence of severe arthropathy or other focal bone abnormality. Vascular calcifications. IMPRESSION: 1. Acute superiorly displaced left femoral neck fracture. 2. No acute displaced fracture or dislocation of the right hip. 3. No acute displaced fracture or diastasis of the bones of the pelvis. Electronically Signed   By: Iven Finn M.D.   On: 05/10/2022 22:17   DG Chest 1 View  Result Date: 05/10/2022 CLINICAL DATA:  Bilateral hip pain after a fall. EXAM: CHEST  1 VIEW COMPARISON:  06/15/2017 FINDINGS: Postoperative changes in the mediastinum. Normal heart size and pulmonary vascularity. No focal airspace disease or consolidation in the lungs. No blunting of costophrenic angles. No pneumothorax. Mediastinal contours appear intact. IMPRESSION: No active disease. Electronically Signed   By: Lucienne Capers M.D.   On: 05/10/2022 22:15      Subjective: Seen and examined the day of discharge.  Pain well controlled.  Sitting up in chair.  No visible distress.  Appropriate for discharge to skilled nursing facility.  Discharge Exam: Vitals:   05/14/22 0436 05/14/22 0738  BP: (!) 98/49 (!) 158/60  Pulse: 82 (!) 50  Resp: 17 16  Temp:  98.2 F (36.8 C)  SpO2: 98% 100%   Vitals:   05/13/22 2024 05/14/22 0012 05/14/22 0436 05/14/22 0738  BP: (!) 135/53 (!) 99/44 (!) 98/49 (!) 158/60  Pulse: (!) 104 85 82 (!) 50  Resp: '18 17 17 16  '$ Temp: 98.7 F (37.1 C)   98.2 F (36.8 C)  TempSrc:      SpO2: 100% 100% 98% 100%  Weight:      Height:        General: Pt is alert, awake, not in acute distress Cardiovascular: RRR, S1/S2 +, no rubs, no gallops Respiratory: CTA  bilaterally, no wheezing, no rhonchi Abdominal: Soft, NT, ND, bowel sounds + Extremities: no edema, no cyanosis    The results of significant diagnostics from this hospitalization (including imaging, microbiology, ancillary and laboratory) are listed below for reference.     Microbiology: No results found for this or any previous visit (from the past 240 hour(s)).   Labs: BNP (last 3 results) No results for input(s): "BNP" in the last 8760 hours. Basic Metabolic Panel: Recent Labs  Lab 05/10/22 2138 05/12/22 0412 05/13/22 0554 05/14/22 0554  NA 136 141 136 139  K 3.6 3.8 3.8 3.7  CL 102 105 105 105  CO2 '26 26 26 27  '$ GLUCOSE 128* 273* 133* 111*  BUN '16 15 23 '$ 34*  CREATININE 0.78 0.85 0.89 1.17*  CALCIUM 9.6 8.7* 8.3* 8.5*   Liver Function Tests: Recent Labs  Lab 05/10/22 2138  AST 28  ALT 18  ALKPHOS 53  BILITOT 0.7  PROT 6.4*  ALBUMIN 3.8   No results for input(s): "LIPASE", "AMYLASE" in  the last 168 hours. No results for input(s): "AMMONIA" in the last 168 hours. CBC: Recent Labs  Lab 05/10/22 2138 05/12/22 0412 05/13/22 0554 05/14/22 0554  WBC 4.8 10.4 10.4 9.7  HGB 10.1* 8.3* 6.3* 7.6*  HCT 31.2* 25.6* 19.3* 22.7*  MCV 89.9 91.4 89.8 91.2  PLT 207 196 140* 138*   Cardiac Enzymes: No results for input(s): "CKTOTAL", "CKMB", "CKMBINDEX", "TROPONINI" in the last 168 hours. BNP: Invalid input(s): "POCBNP" CBG: Recent Labs  Lab 05/13/22 0825 05/13/22 1234 05/13/22 1633 05/13/22 2034 05/14/22 0736  GLUCAP 201* 305* 231* 286* 128*   D-Dimer No results for input(s): "DDIMER" in the last 72 hours. Hgb A1c No results for input(s): "HGBA1C" in the last 72 hours. Lipid Profile No results for input(s): "CHOL", "HDL", "LDLCALC", "TRIG", "CHOLHDL", "LDLDIRECT" in the last 72 hours. Thyroid function studies No results for input(s): "TSH", "T4TOTAL", "T3FREE", "THYROIDAB" in the last 72 hours.  Invalid input(s): "FREET3" Anemia work up No results  for input(s): "VITAMINB12", "FOLATE", "FERRITIN", "TIBC", "IRON", "RETICCTPCT" in the last 72 hours. Urinalysis    Component Value Date/Time   COLORURINE STRAW (A) 05/03/2017 1152   APPEARANCEUR CLEAR 05/03/2017 1152   LABSPEC 1.011 05/03/2017 1152   PHURINE 6.0 05/03/2017 1152   GLUCOSEU >=500 (A) 05/03/2017 1152   HGBUR NEGATIVE 05/03/2017 1152   BILIRUBINUR NEGATIVE 05/03/2017 1152   KETONESUR NEGATIVE 05/03/2017 1152   PROTEINUR NEGATIVE 05/03/2017 1152   NITRITE NEGATIVE 05/03/2017 1152   LEUKOCYTESUR LARGE (A) 05/03/2017 1152   Sepsis Labs Recent Labs  Lab 05/10/22 2138 05/12/22 0412 05/13/22 0554 05/14/22 0554  WBC 4.8 10.4 10.4 9.7   Microbiology No results found for this or any previous visit (from the past 240 hour(s)).   Time coordinating discharge: Over 30 minutes  SIGNED:   Sidney Ace, MD  [facility] 05/14/2022, 10:44 AM Pager   If 7PM-7AM, please contact night-coverage

## 2022-05-15 LAB — TYPE AND SCREEN
ABO/RH(D): A POS
Antibody Screen: NEGATIVE
Unit division: 0

## 2022-05-15 LAB — BPAM RBC
Blood Product Expiration Date: 202308182359
ISSUE DATE / TIME: 202307281100
Unit Type and Rh: 6200

## 2022-05-21 ENCOUNTER — Encounter: Payer: Self-pay | Admitting: Internal Medicine

## 2022-05-25 ENCOUNTER — Ambulatory Visit
Admission: RE | Admit: 2022-05-25 | Discharge: 2022-05-25 | Disposition: A | Discharge status: Home / Self Care | Payer: Medicare Other | Source: Ambulatory Visit | Attending: Physician Assistant | Admitting: Physician Assistant

## 2022-05-25 ENCOUNTER — Other Ambulatory Visit: Payer: Self-pay | Admitting: Physician Assistant

## 2022-05-25 DIAGNOSIS — R2240 Localized swelling, mass and lump, unspecified lower limb: Secondary | ICD-10-CM | POA: Diagnosis present

## 2022-06-01 ENCOUNTER — Other Ambulatory Visit: Payer: Self-pay | Admitting: Cardiovascular Disease

## 2022-06-01 ENCOUNTER — Other Ambulatory Visit: Payer: Self-pay | Admitting: Internal Medicine

## 2022-06-17 ENCOUNTER — Encounter: Payer: Self-pay | Admitting: Podiatry

## 2022-06-17 ENCOUNTER — Ambulatory Visit: Payer: Medicare Other | Admitting: Podiatry

## 2022-06-17 DIAGNOSIS — I739 Peripheral vascular disease, unspecified: Secondary | ICD-10-CM | POA: Diagnosis not present

## 2022-06-17 DIAGNOSIS — M79674 Pain in right toe(s): Secondary | ICD-10-CM

## 2022-06-17 DIAGNOSIS — M792 Neuralgia and neuritis, unspecified: Secondary | ICD-10-CM

## 2022-06-17 DIAGNOSIS — L84 Corns and callosities: Secondary | ICD-10-CM | POA: Diagnosis not present

## 2022-06-17 DIAGNOSIS — M79675 Pain in left toe(s): Secondary | ICD-10-CM

## 2022-06-17 DIAGNOSIS — B351 Tinea unguium: Secondary | ICD-10-CM

## 2022-06-17 NOTE — Progress Notes (Signed)
This patient returns to my office for at risk foot care.  This patient requires this care by a professional since this patient will be at risk due to having claudication, PAD and DM.  She has been seeing Dr.  Posey Pronto for ulcer left forefoot. He sends her back to me for routine foot care. This patient is unable to cut nails himself since the patient cannot reach his nails.These nails are painful walking and wearing shoes.  She has thickened callus on her left forefoot sub 2 left.   This patient presents for at risk foot care today.  General Appearance  Alert, conversant and in no acute stress.  Vascular  Dorsalis pedis and posterior tibial  pulses are weakly  palpable  bilaterally.  Capillary return is within normal limits  bilaterally. Temperature is within normal limits  bilaterally.  Neurologic  Senn-Weinstein monofilament wire test within normal limits  bilaterally. Muscle power within normal limits bilaterally.  Nails Thick disfigured discolored nails with subungual debris  from hallux to fifth toes bilaterally. No evidence of bacterial infection or drainage bilaterally.  Orthopedic  No limitations of motion  feet .  No crepitus or effusions noted.  No bony pathology or digital deformities noted.  Skin  normotropic skin with no porokeratosis noted bilaterally.  No signs of infections or ulcers noted.   Callus sub 2 left foot.  Onychomycosis  Pain in right toes  Pain in left toes  Callus left forefoot.  Consent was obtained for treatment procedures.   Mechanical debridement of nails 1-5  bilaterally performed with a nail nipper.  Filed with dremel without incident. Debride callus with # 15 blade followed by dremel tool usage.   Return office visit   9 weeks                  Told patient to return for periodic foot care and evaluation due to potential at risk complications.   Gardiner Barefoot DPM

## 2022-06-30 ENCOUNTER — Encounter: Payer: Self-pay | Admitting: Internal Medicine

## 2022-06-30 ENCOUNTER — Ambulatory Visit (INDEPENDENT_AMBULATORY_CARE_PROVIDER_SITE_OTHER): Payer: Medicare Other | Admitting: Internal Medicine

## 2022-06-30 VITALS — BP 139/73 | HR 87 | Ht 68.0 in | Wt 143.2 lb

## 2022-06-30 DIAGNOSIS — E039 Hypothyroidism, unspecified: Secondary | ICD-10-CM | POA: Diagnosis not present

## 2022-06-30 DIAGNOSIS — D508 Other iron deficiency anemias: Secondary | ICD-10-CM

## 2022-06-30 DIAGNOSIS — I10 Essential (primary) hypertension: Secondary | ICD-10-CM

## 2022-06-30 DIAGNOSIS — Z794 Long term (current) use of insulin: Secondary | ICD-10-CM

## 2022-06-30 DIAGNOSIS — E119 Type 2 diabetes mellitus without complications: Secondary | ICD-10-CM

## 2022-06-30 DIAGNOSIS — Z1211 Encounter for screening for malignant neoplasm of colon: Secondary | ICD-10-CM

## 2022-06-30 DIAGNOSIS — S72002A Fracture of unspecified part of neck of left femur, initial encounter for closed fracture: Secondary | ICD-10-CM

## 2022-06-30 LAB — GLUCOSE, POCT (MANUAL RESULT ENTRY): POC Glucose: 128 mg/dl — AB (ref 70–99)

## 2022-06-30 NOTE — Assessment & Plan Note (Signed)
We will check CBC schedule the patient for colonoscopy

## 2022-06-30 NOTE — Addendum Note (Signed)
Addended by: Lacretia Nicks L on: 06/30/2022 11:35 AM   Modules accepted: Orders

## 2022-06-30 NOTE — Progress Notes (Signed)
Established Patient Office Visit  Subjective:  Patient ID: Carrie Kane, female    DOB: 09-06-1941  Age: 81 y.o. MRN: [id number]  CC:  Chief Complaint  Patient presents with   Follow-up    Patient here for follow up after rehab from broken hip     HPI  Carrie Kane presents for black stools , wants   colon exam , h/o lt hip surgery  Past Medical History:  Diagnosis Date   CAD (coronary artery disease)    a. 04/2017 Cath: LM 58, LAD 80p, 51m, LCX 95ost, 28m, EF 45-50%; b. 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   Diastolic dysfunction    a. 04/2017 Echo: EF 55-60%, no rwma, Gr1 DD, mildly dil LA; b. 08/2020 Echo: EF 55-60%, no rwma, GrII DD, nl RV fxn, RVSP 42mmHg, mod dil LA, mildly dil RA, Mod TR.   Hyperlipidemia    Hypertensive heart disease    Insulin dependent diabetes mellitus    PAD (peripheral artery disease) ([place])    a. 02/2021 PTA/DCBA R Peroneal, R Popliteal, distal R SFA.   PSVT (paroxysmal supraventricular tachycardia) ([place])    a. 02/2022 Zio: Predominantly sinus rhythm @ 61 (36-218). 2 NSVT runs (fastest/longest 6 beats @ 218). 37 SVT/A tach runs (fastest 185 x 5 beats, longest 32.4 secs @ 107). Triggered events = RSR, PAC.   Stroke [facility] [place])    a. 01/2022 R sided wkns/aphasia/tremor/slurred speech-->Ss resolved; b. 02/2022 MRI/A: punctate subacute inf vs artifact-post limb of L int capsule. 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 Mod, chronic small vessel isch changes within the cerebral white matter.  Subcm chronic infarct- L cerebellar hemisphere.  Sev dzs prox P2 seg of R PCA. Mod dzs A1 R Clinical cytogeneticist.    Past Surgical History:  Procedure Laterality Date   ABDOMINAL HYSTERECTOMY     APPLICATION OF WOUND VAC  05/11/2022   Procedure: APPLICATION OF WOUND VAC;  Surgeon: Renee Harder, MD;  Location: [facility];  Service: Orthopedics;;   CATARACT EXTRACTION W/PHACO Right 10/26/2021   Procedure: CATARACT EXTRACTION PHACO AND  INTRAOCULAR LENS PLACEMENT ([place]) RIGHT DIABETIC 16.27 01:23.8;  Surgeon: Eulogio Bear, MD;  Location: [place];  Service: Ophthalmology;  Laterality: Right;  Please leave arrival at 8:00   CATARACT EXTRACTION W/PHACO Left 11/09/2021   Procedure: CATARACT EXTRACTION PHACO AND INTRAOCULAR LENS PLACEMENT ([place]) LEFT DIABETIC;  Surgeon: Eulogio Bear, MD;  Location: [place];  Service: Ophthalmology;  Laterality: Left;  13.39 01:13.0   CORONARY ARTERY BYPASS GRAFT N/A 05/09/2017   Procedure: CORONARY ARTERY BYPASS GRAFTING (CABG) x 3 using left internal mammary artery and right greater saphenous vein harvested endoscopically;  Surgeon: Ivin Poot, MD;  Location: [place];  Service: Open Heart Surgery;  Laterality: N/A;   HIP ARTHROPLASTY Left 05/11/2022   Procedure: ARTHROPLASTY BIPOLAR HIP (HEMIARTHROPLASTY);  Surgeon: Renee Harder, MD;  Location: [facility];  Service: Orthopedics;  Laterality: Left;   INTRAOPERATIVE TRANSESOPHAGEAL ECHOCARDIOGRAM N/A 05/09/2017   Procedure: INTRAOPERATIVE TRANSESOPHAGEAL ECHOCARDIOGRAM;  Surgeon: Ivin Poot, MD;  Location: [place];  Service: Open Heart Surgery;  Laterality: N/A;   IR RADIOLOGIST EVAL & MGMT  12/04/2020   LEFT HEART CATH AND CORONARY ANGIOGRAPHY N/A 05/02/2017   Procedure: Left Heart Cath and Coronary Angiography;  Surgeon: Wellington Hampshire, MD;  Location: [place] [facility];  Service: Cardiovascular;  Laterality: N/A;   LOWER EXTREMITY ANGIOGRAPHY Left 02/09/2021   Procedure: LOWER EXTREMITY ANGIOGRAPHY;  Surgeon: Leotis Pain  S, MD;  Location: [facility];  Service: Cardiovascular;  Laterality: Left;   LOWER EXTREMITY ANGIOGRAPHY Right 02/16/2021   Procedure: LOWER EXTREMITY ANGIOGRAPHY;  Surgeon: Annice Needy, MD;  Location: [facility];  Service: Cardiovascular;  Laterality: Right;    Family History  Problem Relation Age of Onset   Breast cancer Maternal Aunt     Social History    Socioeconomic History   Marital status: Married    Spouse name: Molly Maduro    Number of children: 1   Years of education: Not on file   Highest education level: Not on file  Occupational History   Not on file  Tobacco Use   Smoking status: Never   Smokeless tobacco: Never  Substance and Sexual Activity   Alcohol use: No   Drug use: No   Sexual activity: Not on file  Other Topics Concern   Not on file  Social History Narrative   Lives in [place] with her husband.  She is responsible for driving and grocery shopping.   Social Determinants of Health   Financial Resource Strain: Low Risk  (10/30/2021)   Overall Financial Resource Strain (CARDIA)    Difficulty of Paying Living Expenses: Not hard at all  Food Insecurity: No Food Insecurity (10/30/2021)   Hunger Vital Sign    Worried About Running Out of Food in the Last Year: Never true    Ran Out of Food in the Last Year: Never true  Transportation Needs: No Transportation Needs (10/30/2021)   PRAPARE - Administrator, Civil Service (Medical): No    Lack of Transportation (Non-Medical): No  Physical Activity: Sufficiently Active (10/30/2021)   Exercise Vital Sign    Days of Exercise per Week: 4 days    Minutes of Exercise per Session: 40 min  Stress: No Stress Concern Present (10/30/2021)   [employer] of Occupational Health - Occupational Stress Questionnaire    Feeling of Stress : Not at all  Social Connections: Socially Integrated (10/30/2021)   Social Connection and Isolation Panel [NHANES]    Frequency of Communication with Friends and Family: More than three times a week    Frequency of Social Gatherings with Friends and Family: More than three times a week    Attends Religious Services: More than 4 times per year    Active Member of [employer] or Organizations: Yes    Attends Engineer, structural: More than 4 times per year    Marital Status: Married  Catering manager Violence: Not At Risk  (10/30/2021)   Humiliation, Afraid, Rape, and Kick questionnaire    Fear of Current or Ex-Partner: No    Emotionally Abused: No    Physically Abused: No    Sexually Abused: No     Current Outpatient Medications:    aspirin EC 81 MG tablet, Take 81 mg by mouth 2 (two) times daily., Disp: 90 tablet, Rfl: 3   atorvastatin (LIPITOR) 40 MG tablet, Take 1 tablet (40 mg total) by mouth daily., Disp: 90 tablet, Rfl: 3   BD INSULIN SYRINGE U/F 30G X 1/2" 0.5 ML MISC, , Disp: , Rfl:    benazepril (LOTENSIN) 40 MG tablet, TAKE 1 TABLET DAILY, Disp: 90 tablet, Rfl: 3   carvedilol (COREG) 3.125 MG tablet, TAKE 1 TABLET TWICE A DAY WITH MEALS, Disp: 180 tablet, Rfl: 3   cholecalciferol (VITAMIN D) 1000 units tablet, Take 2,000 Units by mouth daily at 12 noon. , Disp: , Rfl:  cloNIDine (CATAPRES) 0.2 MG tablet, TAKE 1 TABLET TWICE A DAY, Disp: 90 tablet, Rfl: 1   clopidogrel (PLAVIX) 75 MG tablet, Take 1 tablet (75 mg total) by mouth daily., Disp: 90 tablet, Rfl: 3   ferrous sulfate 325 (65 FE) MG EC tablet, Take 1 tablet (325 mg total) by mouth daily at 2 PM., Disp: 90 tablet, Rfl: 3   HUMALOG KWIKPEN 100 UNIT/ML KwikPen, INJECT 38 UNITS UNDER THE SKIN DAILY (Patient taking differently: 10-12 Units in the morning and at bedtime. 10-12 units BID prn), Disp: 45 mL, Rfl: 2   HUMALOG MIX 75/25 KWIKPEN (75-25) 100 UNIT/ML Kwikpen, Inject 45 Units into the skin daily at 2 PM., Disp: 15 mL, Rfl: 3   hydrochlorothiazide (MICROZIDE) 12.5 MG capsule, Take 12.5 mg by mouth daily., Disp: , Rfl:    insulin glargine (LANTUS) 100 UNIT/ML injection, INJECT 45 UNITS UNDER THE SKIN AT BEDTIME (Patient taking differently: 20 Units at bedtime. Pt takes 20 units depending on blood sugar level), Disp: 40 mL, Rfl: 3   levothyroxine (SYNTHROID) 100 MCG tablet, TAKE 1 TABLET DAILY BEFORE BREAKFAST, Disp: 90 tablet, Rfl: 3   Multiple Vitamin (MULTIVITAMIN) capsule, Take 1 capsule by mouth daily at 12 noon. , Disp: , Rfl:     nitroGLYCERIN (NITROSTAT) 0.4 MG SL tablet, Place 1 tablet (0.4 mg total) under the tongue every 5 (five) minutes as needed for chest pain., Disp: 25 tablet, Rfl: 3   Polyethyl Glycol-Propyl Glycol (SYSTANE OP), Apply 1 drop to eye as needed (dry eyes)., Disp: , Rfl:    valsartan (DIOVAN) 320 MG tablet, Take 1 tablet (320 mg total) by mouth daily., Disp: 90 tablet, Rfl: 3   Allergies  Allergen Reactions   Shellfish Allergy Anaphylaxis    Throat swelling   Flexeril [Cyclobenzaprine] Hypertension    ROS Review of Systems  Constitutional: Negative.   HENT: Negative.    Eyes: Negative.   Respiratory: Negative.    Cardiovascular: Negative.   Gastrointestinal: Negative.   Endocrine: Negative.   Genitourinary: Negative.   Musculoskeletal: Negative.   Skin: Negative.   Allergic/Immunologic: Negative.   Neurological: Negative.   Hematological: Negative.   Psychiatric/Behavioral: Negative.    All other systems reviewed and are negative.     Objective:    Physical Exam Vitals reviewed.  Constitutional:      Appearance: Normal appearance.  HENT:     Mouth/Throat:     Mouth: Mucous membranes are moist.  Eyes:     Pupils: Pupils are equal, round, and reactive to light.  Neck:     Vascular: No carotid bruit.  Cardiovascular:     Rate and Rhythm: Normal rate and regular rhythm.     Pulses: Normal pulses.     Heart sounds: Normal heart sounds.  Pulmonary:     Effort: Pulmonary effort is normal.     Breath sounds: Normal breath sounds.  Abdominal:     General: Bowel sounds are normal.     Palpations: Abdomen is soft. There is no hepatomegaly, splenomegaly or mass.     Tenderness: There is no abdominal tenderness.     Hernia: No hernia is present.  Musculoskeletal:        General: No tenderness.     Cervical back: Neck supple.     Right lower leg: No edema.     Left lower leg: No edema.  Skin:    Findings: No rash.  Neurological:     Mental Status: She is alert and  oriented to person, place, and time.     Motor: No weakness.  Psychiatric:        Mood and Affect: Mood and affect normal.        Behavior: Behavior normal.     BP 139/73   Pulse 87   Ht $R'5\' 8"'VD$  (1.727 m)   Wt 143 lb 3.2 oz (65 kg)   BMI 21.77 kg/m  Wt Readings from Last 3 Encounters:  06/30/22 143 lb 3.2 oz (65 kg)  05/11/22 154 lb 1.6 oz (69.9 kg)  03/02/22 154 lb 2 oz (69.9 kg)     Health Maintenance Due  Topic Date Due   TETANUS/TDAP  Never done   Zoster Vaccines- Shingrix (1 of 2) Never done   Pneumonia Vaccine 55+ Years old (2 - PPSV23 or PCV20) 09/04/2015   COVID-19 Vaccine (4 - Moderna risk series) 11/23/2021   INFLUENZA VACCINE  05/18/2022    There are no preventive care reminders to display for this patient.  Lab Results  Component Value Date   TSH 0.620 08/25/2021   Lab Results  Component Value Date   WBC 9.7 05/14/2022   HGB 7.6 (L) 05/14/2022   HCT 22.7 (L) 05/14/2022   MCV 91.2 05/14/2022   PLT 138 (L) 05/14/2022   Lab Results  Component Value Date   NA 139 05/14/2022   K 3.7 05/14/2022   CO2 27 05/14/2022   GLUCOSE 111 (H) 05/14/2022   BUN 34 (H) 05/14/2022   CREATININE 1.17 (H) 05/14/2022   BILITOT 0.7 05/10/2022   ALKPHOS 53 05/10/2022   AST 28 05/10/2022   ALT 18 05/10/2022   PROT 6.4 (L) 05/10/2022   ALBUMIN 3.8 05/10/2022   CALCIUM 8.5 (L) 05/14/2022   ANIONGAP 7 05/14/2022   EGFR 69 08/25/2021   Lab Results  Component Value Date   CHOL 150 08/25/2021   Lab Results  Component Value Date   HDL 74 08/25/2021   Lab Results  Component Value Date   LDLCALC 62 08/25/2021   Lab Results  Component Value Date   TRIG 71 08/25/2021   Lab Results  Component Value Date   CHOLHDL 1.9 08/11/2020   Lab Results  Component Value Date   HGBA1C 8.8 (H) 05/10/2022      Assessment & Plan:   Problem List Items Addressed This Visit       Cardiovascular and Mediastinum   Essential hypertension     Patient denies any chest pain or  shortness of breath there is no history of palpitation or paroxysmal nocturnal dyspnea   patient was advised to follow low-salt low-cholesterol diet    ideally I want to keep systolic blood pressure below 130 mmHg, patient was asked to check blood pressure one times a week and give me a report on that.  Patient will be follow-up in 3 months  or earlier as needed, patient will call me back for any change in the cardiovascular symptoms Patient was advised to buy a book from local bookstore concerning blood pressure and read several chapters  every day.  This will be supplemented by some of the material we will give him from the office.  Patient should also utilize other resources like YouTube and Internet to learn more about the blood pressure and the diet.        Endocrine   Type 2 diabetes mellitus without complication, with long-term current use of insulin (HCC) - Primary   Relevant Orders   POCT glucose (manual entry) (Completed)  Hypothyroidism    Stable        Musculoskeletal and Integument   Left displaced femoral neck fracture (HCC)    Stable        Other   Anemia    We will check CBC schedule the patient for colonoscopy       No orders of the defined types were placed in this encounter.   Follow-up: No follow-ups on file.    Cletis Athens, MD

## 2022-06-30 NOTE — Assessment & Plan Note (Signed)
Stable

## 2022-06-30 NOTE — Assessment & Plan Note (Signed)

## 2022-07-01 ENCOUNTER — Telehealth: Payer: Self-pay | Admitting: Nurse Practitioner

## 2022-07-01 LAB — CBC WITH DIFFERENTIAL/PLATELET
Basophils Absolute: 0.1 10*3/uL (ref 0.0–0.2)
Basos: 1 %
EOS (ABSOLUTE): 0.2 10*3/uL (ref 0.0–0.4)
Eos: 5 %
Hematocrit: 31.7 % — ABNORMAL LOW (ref 34.0–46.6)
Hemoglobin: 10.6 g/dL — ABNORMAL LOW (ref 11.1–15.9)
Immature Grans (Abs): 0 10*3/uL (ref 0.0–0.1)
Immature Granulocytes: 0 %
Lymphocytes Absolute: 2 10*3/uL (ref 0.7–3.1)
Lymphs: 38 %
MCH: 32.5 pg (ref 26.6–33.0)
MCHC: 33.4 g/dL (ref 31.5–35.7)
MCV: 97 fL (ref 79–97)
Monocytes Absolute: 0.3 10*3/uL (ref 0.1–0.9)
Monocytes: 6 %
Neutrophils Absolute: 2.6 10*3/uL (ref 1.4–7.0)
Neutrophils: 50 %
Platelets: 232 10*3/uL (ref 150–450)
RBC: 3.26 x10E6/uL — ABNORMAL LOW (ref 3.77–5.28)
RDW: 13.6 % (ref 11.7–15.4)
WBC: 5.1 10*3/uL (ref 3.4–10.8)

## 2022-07-01 NOTE — Telephone Encounter (Addendum)
Unsure if pt was needing a Zio... she had an  Event Monitor 03/2022... but reviewed hosp notes from 04/2022 and no note of Zio needed... pt is in a skilled NF... will forward to her provider to be sure no further arrhythmia testing needed.  Next OV 08/2022 with Dr Rockey Situ... pt had inpatient cardio consult with Angelica Ran NP 04/2022.

## 2022-07-01 NOTE — Telephone Encounter (Signed)
Original 2 wk zio ordered in May - entry for 5/16 shows that it began and ended on the same day.  Subsequent result from 03/2022 presumably comes from the original order.  I'm not aware of any other zio being ordered, placed, or necessary - review of progress notes/discharge summary devoid of mention for needing a monitor related to hospitalization.  As best I can tell, she does not need additional monitoring.

## 2022-07-01 NOTE — Telephone Encounter (Signed)
Zio monitor from [facility] called to let us know they received a monitor back from this patient but it looks like it may have never been started.  There is no data on it.  They called is to let us know just in case we want to reorder another monitor for the patient.

## 2022-07-06 ENCOUNTER — Telehealth: Payer: Self-pay | Admitting: Nurse Practitioner

## 2022-07-06 ENCOUNTER — Other Ambulatory Visit: Payer: Self-pay

## 2022-07-06 DIAGNOSIS — Z1211 Encounter for screening for malignant neoplasm of colon: Secondary | ICD-10-CM

## 2022-07-06 MED ORDER — NA SULFATE-K SULFATE-MG SULF 17.5-3.13-1.6 GM/177ML PO SOLN: 1.0000 | Freq: Once | ORAL | 0 refills | Status: AC

## 2022-07-06 NOTE — Telephone Encounter (Signed)
   Pre-operative Risk Assessment    Patient Name: Carrie Kane  DOB: December 26, 1940 MRN: [id number]{      Request for Surgical Clearance    Procedure:   COLONOSCOPY  Date of Surgery:  Clearance 08/05/22                               Surgeon:  DR Lucilla Lame Surgeon's Group or Practice Name:  [facility] Phone number:  [phone] Fax number:  [phone]  Type of Clearance Requested:     Type of Anesthesia:  Not Indicated   Additional requests/questions:    Signed, Eli Phillips   07/06/2022, 3:23 PM

## 2022-07-06 NOTE — Telephone Encounter (Signed)
Gastroenterology Pre-Procedure Review  Request Date: 07/1922 Requesting Physician: Dr. Allen Norris  PATIENT REVIEW QUESTIONS: The patient responded to the following health history questions as indicated:    1. Are you having any GI issues? yes (Iron Def. Anemia, Black Stools (Takes Ferrous Sulfate '325mg'$  daily- has been advised to hold 5 days prior to colonoscopy)) 2. Do you have a personal history of Polyps? no 3. Do you have a family history of Colon Cancer or Polyps? no patient stated her last colonoscopy was performed by Dr. Allen Norris 10-12 years ago normal 4. Diabetes Mellitus? yes (patient is type 2 diabetic has been advised to take half usual dose of lantus and humalog the day before her colonoscopy) 5. Joint replacements in the past 12 months?no 6. Major health problems in the past 3 months?Left displaced femoral neck fracture 05/10/22 7. Any artificial heart valves, MVP, or defibrillator? Patient has history of stroke  May 2023    MEDICATIONS & ALLERGIES:    Patient reports the following regarding taking any anticoagulation/antiplatelet therapy:   Plavix, Coumadin, Eliquis, Xarelto, Lovenox, Pradaxa, Brilinta, or Effient? yes (Patient was prescribed Plavix by [facility] Cardiologist Dr. Sharolyn Douglas.  Cardiac/Blood Thinner Request will be sent to his office) Aspirin? yes (81 mg daily)  Patient confirms/reports the following medications:  Current Outpatient Medications  Medication Sig Dispense Refill   aspirin EC 81 MG tablet Take 81 mg by mouth 2 (two) times daily. 90 tablet 3   atorvastatin (LIPITOR) 40 MG tablet Take 1 tablet (40 mg total) by mouth daily. 90 tablet 3   BD INSULIN SYRINGE U/F 30G X 1/2" 0.5 ML MISC      benazepril (LOTENSIN) 40 MG tablet TAKE 1 TABLET DAILY 90 tablet 3   carvedilol (COREG) 3.125 MG tablet TAKE 1 TABLET TWICE A DAY WITH MEALS 180 tablet 3   cholecalciferol (VITAMIN D) 1000 units tablet Take 2,000 Units by mouth daily at 12 noon.      cloNIDine (CATAPRES) 0.2 MG tablet TAKE  1 TABLET TWICE A DAY 90 tablet 1   clopidogrel (PLAVIX) 75 MG tablet Take 1 tablet (75 mg total) by mouth daily. 90 tablet 3   ferrous sulfate 325 (65 FE) MG EC tablet Take 1 tablet (325 mg total) by mouth daily at 2 PM. 90 tablet 3   HUMALOG KWIKPEN 100 UNIT/ML KwikPen INJECT 38 UNITS UNDER THE SKIN DAILY (Patient taking differently: 10-12 Units in the morning and at bedtime. 10-12 units BID prn) 45 mL 2   HUMALOG MIX 75/25 KWIKPEN (75-25) 100 UNIT/ML Kwikpen Inject 45 Units into the skin daily at 2 PM. 15 mL 3   hydrochlorothiazide (MICROZIDE) 12.5 MG capsule Take 12.5 mg by mouth daily.     insulin glargine (LANTUS) 100 UNIT/ML injection INJECT 45 UNITS UNDER THE SKIN AT BEDTIME (Patient taking differently: 20 Units at bedtime. Pt takes 20 units depending on blood sugar level) 40 mL 3   levothyroxine (SYNTHROID) 100 MCG tablet TAKE 1 TABLET DAILY BEFORE BREAKFAST 90 tablet 3   Multiple Vitamin (MULTIVITAMIN) capsule Take 1 capsule by mouth daily at 12 noon.      nitroGLYCERIN (NITROSTAT) 0.4 MG SL tablet Place 1 tablet (0.4 mg total) under the tongue every 5 (five) minutes as needed for chest pain. 25 tablet 3   Polyethyl Glycol-Propyl Glycol (SYSTANE OP) Apply 1 drop to eye as needed (dry eyes).     valsartan (DIOVAN) 320 MG tablet Take 1 tablet (320 mg total) by mouth daily. 90 tablet 3  No current facility-administered medications for this visit.    Patient confirms/reports the following allergies:  Allergies  Allergen Reactions   Shellfish Allergy Anaphylaxis    Throat swelling   Flexeril [Cyclobenzaprine] Hypertension    No orders of the defined types were placed in this encounter.   AUTHORIZATION INFORMATION Primary Insurance: 1D#: Group #:  Secondary Insurance: 1D#: Group #:  SCHEDULE INFORMATION: Date: 08/05/22 Time: Location: [facility]

## 2022-07-08 ENCOUNTER — Telehealth: Payer: Self-pay | Admitting: *Deleted

## 2022-07-08 NOTE — Telephone Encounter (Signed)
Pt has been scheduled for a tele visit, 07/12/22 2:00.  Medications reconciled / consent on file.    Patient Consent for Virtual Visit        Carrie Kane has provided verbal consent on 07/08/2022 for a virtual visit (video or telephone).   CONSENT FOR VIRTUAL VISIT FOR:  Carrie Kane  By participating in this virtual visit I agree to the following:  I hereby voluntarily request, consent and authorize [facility] and its employed or contracted physicians, physician assistants, nurse practitioners or other licensed health care professionals (the Practitioner), to provide me with telemedicine health care services (the "Services") as deemed necessary by the treating Practitioner. I acknowledge and consent to receive the Services by the Practitioner via telemedicine. I understand that the telemedicine visit will involve communicating with the Practitioner through live audiovisual communication technology and the disclosure of certain medical information by electronic transmission. I acknowledge that I have been given the opportunity to request an in-person assessment or other available alternative prior to the telemedicine visit and am voluntarily participating in the telemedicine visit.  I understand that I have the right to withhold or withdraw my consent to the use of telemedicine in the course of my care at any time, without affecting my right to future care or treatment, and that the Practitioner or I may terminate the telemedicine visit at any time. I understand that I have the right to inspect all information obtained and/or recorded in the course of the telemedicine visit and may receive copies of available information for a reasonable fee.  I understand that some of the potential risks of receiving the Services via telemedicine include:  Delay or interruption in medical evaluation due to technological equipment failure or disruption; Information transmitted may not be sufficient  (e.g. poor resolution of images) to allow for appropriate medical decision making by the Practitioner; and/or  In rare instances, security protocols could fail, causing a breach of personal health information.  Furthermore, I acknowledge that it is my responsibility to provide information about my medical history, conditions and care that is complete and accurate to the best of my ability. I acknowledge that Practitioner's advice, recommendations, and/or decision may be based on factors not within their control, such as incomplete or inaccurate data provided by me or distortions of diagnostic images or specimens that may result from electronic transmissions. I understand that the practice of medicine is not an exact science and that Practitioner makes no warranties or guarantees regarding treatment outcomes. I acknowledge that a copy of this consent can be made available to me via my patient portal ([facility]), or I can request a printed copy by calling the office of [facility].    I understand that my insurance will be billed for this visit.   I have read or had this consent read to me. I understand the contents of this consent, which adequately explains the benefits and risks of the Services being provided via telemedicine.  I have been provided ample opportunity to ask questions regarding this consent and the Services and have had my questions answered to my satisfaction. I give my informed consent for the services to be provided through the use of telemedicine in my medical care

## 2022-07-08 NOTE — Telephone Encounter (Signed)
   Name: Carrie Kane  DOB: 04/29/1941  MRN: [id number]  Primary Cardiologist: Ida Rogue, MD  Chart reviewed as part of pre-operative protocol coverage. Because of [name]'s past medical history and time since last visit, she will require a follow-up telephone visit in order to better assess preoperative cardiovascular risk.  Pre-op covering staff: - Please schedule appointment and call patient to inform them. If patient already had an upcoming appointment within acceptable timeframe, please add "pre-op clearance" to the appointment notes so provider is aware. - Please contact requesting surgeon's office via preferred method (i.e, phone, fax) to inform them of need for appointment prior to surgery.  No medications indicated as needing to be held.  Elgie Collard, PA-C  07/08/2022, 8:36 AM

## 2022-07-08 NOTE — Telephone Encounter (Signed)
Pt has been scheduled for tele visit, 07/12/22.  Consent on file / medications reconciled.

## 2022-07-12 ENCOUNTER — Ambulatory Visit: Payer: Medicare Other | Attending: Internal Medicine | Admitting: Nurse Practitioner

## 2022-07-12 DIAGNOSIS — Z01810 Encounter for preprocedural cardiovascular examination: Secondary | ICD-10-CM

## 2022-07-12 NOTE — Progress Notes (Signed)
Virtual Visit via Telephone Note   Because of Carrie Kane's co-morbid illnesses, she is at least at moderate risk for complications without adequate follow up.  This format is felt to be most appropriate for this patient at this time.  The patient did not have access to video technology/had technical difficulties with video requiring transitioning to audio format only (telephone).  All issues noted in this document were discussed and addressed.  No physical exam could be performed with this format.  Please refer to the patient's chart for her consent to telehealth for [facility].  Evaluation Performed:  Preoperative cardiovascular risk assessment _____________   Date:  07/12/2022   Patient ID:  Carrie Kane, DOB Mar 16, 1941, MRN [id number] Patient Location:  Home Provider location:   Office  Primary Care Provider:  Cletis Athens, MD Primary Cardiologist:  Ida Rogue, MD  Chief Complaint / Patient Profile   81 y.o. y/o female with a h/o CAD s/p CABG in 2018, hypertension, hyperlipidemia, CVA, PAD, diastolic dysfunction, dilation of ascending aorta, and type 2 diabetes who is pending colonoscopy on 08/05/2022 with Dr. Lucilla Lame of  GI and presents today for telephonic preoperative cardiovascular risk assessment.  Past Medical History    Past Medical History:  Diagnosis Date   CAD (coronary artery disease)    a. 04/2017 Cath: LM 39, LAD 80p, 43m LCX 95ost, 743mEF 45-50%; b. 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   Diastolic dysfunction    a. 04/2017 Echo: EF 55-60%, no rwma, Gr1 DD, mildly dil LA; b. 08/2020 Echo: EF 55-60%, no rwma, GrII DD, nl RV fxn, RVSP 3027m, mod dil LA, mildly dil RA, Mod TR.   Hyperlipidemia    Hypertensive heart disease    Insulin dependent diabetes mellitus    PAD (peripheral artery disease) ([facility]  a. 02/2021 PTA/DCBA R Peroneal, R Popliteal, distal R SFA.   PSVT (paroxysmal supraventricular tachycardia) ([facility]CC[place]  a.  02/2022 Zio: Predominantly sinus rhythm @ 61 (36-218). 2 NSVT runs (fastest/longest 6 beats @ 218). 37 SVT/A tach runs (fastest 185 x 5 beats, longest 32.4 secs @ 107). Triggered events = RSR, PAC.   [facility]  [facility] 01/2022 R sided wkns/aphasia/tremor/slurred speech-->Ss resolved; b. 02/2022 MRI/A: punctate subacute inf vs artifact-post limb of L int capsule. 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 Mod, chronic small vessel isch changes within the cerebral white matter.  Subcm chronic infarct- L cerebellar hemisphere.  Sev dzs prox P2 seg of R PCA. Mod dzs A1 R antClinical cytogeneticist Past Surgical History:  Procedure Laterality Date   ABDOMINAL HYSTERECTOMY     APPLICATION OF WOUND VAC  05/11/2022   Procedure: APPLICATION OF WOUND VAC;  Surgeon: CraRenee HarderD;  Location: [facility] ORS;  Service: Orthopedics;;   CATARACT EXTRACTION W/PHACO Right 10/26/2021   Procedure: CATARACT EXTRACTION PHACO AND INTRAOCULAR LENS PLACEMENT (IOCThree CreeksIGHT DIABETIC 16.27 01:23.8;  Surgeon: KinEulogio BearD;  [place]: Op[place]mology;  Laterality: Right;  Please leave arrival at 8:00   CATARACT EXTRACTION W/PHACO Left 11/09/2021   Procedure: CATARACT EXTRACTION PHACO AND INTRAOCULAR LENS PLACEMENT (IOCColverEFT DIABETIC;  Surgeon: KinEulogio BearD;  [place]: Op[place]mology;  Laterality: Left;  13.39 01:13.0   CORONARY ARTERY BYPASS GRAFT N/A 05/09/2017   Procedure: CORONARY ARTERY BYPASS GRAFTING (CABG) x 3 using left internal mammary artery and right greater saphenous vein harvested endoscopically;  Surgeon: VanLucianne Lei  Donney Rankins, MD;  Location: [place];  Service: Open Heart Surgery;  Laterality: N/A;   HIP ARTHROPLASTY Left 05/11/2022   Procedure: ARTHROPLASTY BIPOLAR HIP (HEMIARTHROPLASTY);  Surgeon: Renee Harder, MD;  Location: [facility] ORS;  Service: Orthopedics;  Laterality: Left;   INTRAOPERATIVE TRANSESOPHAGEAL ECHOCARDIOGRAM  N/A 05/09/2017   Procedure: INTRAOPERATIVE TRANSESOPHAGEAL ECHOCARDIOGRAM;  Surgeon: Ivin Poot, MD;  Location: [place];  Service: Open Heart Surgery;  Laterality: N/A;   IR RADIOLOGIST EVAL & MGMT  12/04/2020   LEFT HEART CATH AND CORONARY ANGIOGRAPHY N/A 05/02/2017   Procedure: Left Heart Cath and Coronary Angiography;  Surgeon: Wellington Hampshire, MD;  Location: [place] CV LAB;  Service: Cardiovascular;  Laterality: N/A;   LOWER EXTREMITY ANGIOGRAPHY Left 02/09/2021   Procedure: LOWER EXTREMITY ANGIOGRAPHY;  Surgeon: Algernon Huxley, MD;  Location: [place] CV LAB;  Service: Cardiovascular;  Laterality: Left;   LOWER EXTREMITY ANGIOGRAPHY Right 02/16/2021   Procedure: LOWER EXTREMITY ANGIOGRAPHY;  Surgeon: Algernon Huxley, MD;  Location: [place] CV LAB;  Service: Cardiovascular;  Laterality: Right;    Allergies  Allergies  Allergen Reactions   Shellfish Allergy Anaphylaxis    Throat swelling   Flexeril [Cyclobenzaprine] Hypertension    History of Present Illness    Carrie Kane is a 81 y.o. female who presents via audio/video conferencing for a telehealth visit today.  Pt was last seen in cardiology clinic on 03/02/2022 by Murray Hodgkins, NP.  At that time Carrie Kane was doing well.  Echocardiogram and 14-day ZIO monitor in the setting of stroke or overall reassuring, as of atrial fibrillation, echo did show mild dilation of ascending aorta, measuring 42 mm. Repeat echocardiogram was recommended in 1 year. The patient is now pending procedure as outlined above. Since her last visit, she has done well from a cardiac standpoint. She denies chest pain, palpitations, dyspnea, pnd, orthopnea, n, v, dizziness, syncope, edema, weight gain, or early satiety. All other systems reviewed and are otherwise negative except as noted above.   Home Medications    Prior to Admission medications   Medication Sig Start Date End Date Taking? Authorizing Provider  aspirin EC 81 MG tablet  Take 81 mg by mouth 2 (two) times daily. 08/02/17   Minna Merritts, MD  atorvastatin (LIPITOR) 40 MG tablet Take 1 tablet (40 mg total) by mouth daily. 06/23/21   Minna Merritts, MD  BD INSULIN SYRINGE U/F 30G X 1/2" 0.5 ML MISC  03/29/20   [provider]  benazepril (LOTENSIN) 40 MG tablet TAKE 1 TABLET DAILY 06/02/22   Cletis Athens, MD  cholecalciferol (VITAMIN D) 1000 units tablet Take 2,000 Units by mouth daily at 12 noon.     [provider]  cloNIDine (CATAPRES) 0.2 MG tablet TAKE 1 TABLET TWICE A DAY 06/02/22   Minna Merritts, MD  clopidogrel (PLAVIX) 75 MG tablet Take 1 tablet (75 mg total) by mouth daily. 06/23/21   Minna Merritts, MD  ferrous sulfate 325 (65 FE) MG EC tablet Take 1 tablet (325 mg total) by mouth daily at 2 PM. 12/23/20   Cletis Athens, MD  HUMALOG KWIKPEN 100 UNIT/ML KwikPen INJECT 38 UNITS UNDER THE SKIN DAILY Patient taking differently: 10-12 Units in the morning and at bedtime. 10-12 units BID prn 03/22/22   Cletis Athens, MD  hydrochlorothiazide (MICROZIDE) 12.5 MG capsule Take 12.5 mg by mouth daily.    [provider]  insulin glargine (LANTUS) 100 UNIT/ML injection INJECT 45 UNITS UNDER THE  SKIN AT BEDTIME Patient taking differently: 20 Units at bedtime. Pt takes 20 units depending on blood sugar level 02/26/21   Beckie Salts, FNP  levothyroxine (SYNTHROID) 100 MCG tablet TAKE 1 TABLET DAILY BEFORE BREAKFAST 03/22/22   Cletis Athens, MD  Multiple Vitamin (MULTIVITAMIN) capsule Take 1 capsule by mouth daily at 12 noon.     [provider]  nitroGLYCERIN (NITROSTAT) 0.4 MG SL tablet Place 1 tablet (0.4 mg total) under the tongue every 5 (five) minutes as needed for chest pain. 06/23/21   Minna Merritts, MD  Polyethyl Glycol-Propyl Glycol (SYSTANE OP) Apply 1 drop to eye as needed (dry eyes).    [provider]  valsartan (DIOVAN) 320 MG tablet Take 1 tablet (320 mg total) by mouth daily. 06/23/21 07/08/22  Minna Merritts, MD    Physical Exam    Vital Signs:  Mirza Kidney does not have vital signs available for review today.  Given telephonic nature of communication, physical exam is limited. AAOx3. NAD. Normal affect.  Speech and respirations are unlabored.  Accessory Clinical Findings    None  Assessment & Plan    1.  Preoperative Cardiovascular Risk Assessment:  According to the Revised Cardiac Risk Index (RCRI), her Perioperative Risk of Major Cardiac Event is (%): 11. Her Functional Capacity in METs is: 5.07 according to the Duke Activity Status Index (DASI). Therefore, based on ACC/AHA guidelines, patient would be at acceptable risk for the planned procedure without further cardiovascular testing.  The patient was advised that if she develops new symptoms prior to surgery to contact our office to arrange for a follow-up visit, and she verbalized understanding.   A copy of this note will be routed to requesting surgeon.  Time:   Today, I have spent 6 minutes with the patient with telehealth technology discussing medical history, symptoms, and management plan.     Lenna Sciara, NP  07/12/2022, 2:13 PM

## 2022-07-14 ENCOUNTER — Ambulatory Visit (INDEPENDENT_AMBULATORY_CARE_PROVIDER_SITE_OTHER): Payer: Medicare Other | Admitting: Internal Medicine

## 2022-07-14 ENCOUNTER — Encounter: Payer: Self-pay | Admitting: Internal Medicine

## 2022-07-14 VITALS — BP 130/79 | HR 68 | Ht 68.0 in | Wt 145.0 lb

## 2022-07-14 DIAGNOSIS — E039 Hypothyroidism, unspecified: Secondary | ICD-10-CM

## 2022-07-14 DIAGNOSIS — I739 Peripheral vascular disease, unspecified: Secondary | ICD-10-CM

## 2022-07-14 DIAGNOSIS — I10 Essential (primary) hypertension: Secondary | ICD-10-CM | POA: Diagnosis not present

## 2022-07-14 DIAGNOSIS — D500 Iron deficiency anemia secondary to blood loss (chronic): Secondary | ICD-10-CM

## 2022-07-14 DIAGNOSIS — E1169 Type 2 diabetes mellitus with other specified complication: Secondary | ICD-10-CM

## 2022-07-14 DIAGNOSIS — E119 Type 2 diabetes mellitus without complications: Secondary | ICD-10-CM

## 2022-07-14 DIAGNOSIS — Z794 Long term (current) use of insulin: Secondary | ICD-10-CM

## 2022-07-14 DIAGNOSIS — R634 Abnormal weight loss: Secondary | ICD-10-CM

## 2022-07-14 NOTE — Progress Notes (Signed)
Established Patient Office Visit  Subjective:  Patient ID: Carrie Kane, female    DOB: Sep 04, 1941  Age: 82 y.o. MRN: [id number]  CC:  Chief Complaint  Patient presents with   Follow-up    Patient is here today for 2 week follow up. To discuss lab results.     HPI  Carrie Kane presents for check up  Past Medical History:  Diagnosis Date   CAD (coronary artery disease)    a. 04/2017 Cath: LM 65, LAD 80p, 70m LCX 95ost, 713mEF 45-50%; b. 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   Diastolic dysfunction    a. 04/2017 Echo: EF 55-60%, no rwma, Gr1 DD, mildly dil LA; b. 08/2020 Echo: EF 55-60%, no rwma, GrII DD, nl RV fxn, RVSP 3068m, mod dil LA, mildly dil RA, Mod TR.   Hyperlipidemia    Hypertensive heart disease    Insulin dependent diabetes mellitus    PAD (peripheral artery disease) (HCCNanak[place]  a. 02/2021 PTA/DCBA R Peroneal, R Popliteal, distal R SFA.   PSVT (paroxysmal supraventricular tachycardia) (HCCH[place]  a. 02/2022 Zio: Predominantly sinus rhythm @ 61 (36-218). 2 NSVT runs (fastest/longest 6 beats @ 218). 37 SVT/A tach runs (fastest 185 x 5 beats, longest 32.4 secs @ 107). Triggered events = RSR, PAC.   [facility]  [facility]. 01/2022 R sided wkns/aphasia/tremor/slurred speech-->Ss resolved; b. 02/2022 MRI/A: punctate subacute inf vs artifact-post limb of L int capsule. 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 Mod, chronic small vessel isch changes within the cerebral white matter.  Subcm chronic infarct- L cerebellar hemisphere.  Sev dzs prox P2 seg of R PCA. Mod dzs A1 R antClinical cytogeneticist  Past Surgical History:  Procedure Laterality Date   ABDOMINAL HYSTERECTOMY     APPLICATION OF WOUND VAC  05/11/2022   Procedure: APPLICATION OF WOUND VAC;  Surgeon: CraRenee HarderD;  Location: [facility];  Service: Orthopedics;;   CATARACT EXTRACTION W/PHACO Right 10/26/2021   Procedure: CATARACT EXTRACTION PHACO AND INTRAOCULAR LENS PLACEMENT (IOCHumeIGHT  DIABETIC 16.27 01:23.8;  Surgeon: KinEulogio BearD;  [place]: Op[place]mology;  Laterality: Right;  Please leave arrival at 8:00   CATARACT EXTRACTION W/PHACO Left 11/09/2021   Procedure: CATARACT EXTRACTION PHACO AND INTRAOCULAR LENS PLACEMENT (IOCOrangeburgEFT DIABETIC;  Surgeon: KinEulogio BearD;  [place]: Op[place]mology;  Laterality: Left;  13.39 01:13.0   CORONARY ARTERY BYPASS GRAFT N/A 05/09/2017   Procedure: CORONARY ARTERY BYPASS GRAFTING (CABG) x 3 using left internal mammary artery and right greater saphenous vein harvested endoscopically;  Surgeon: VanIvin PootD;  [facility] [place]: [facility] Surgery;  Laterality: N/A;   HIP ARTHROPLASTY Left 05/11/2022   Procedure: ARTHROPLASTY BIPOLAR HIP (HEMIARTHROPLASTY);  Surgeon: CraRenee HarderD;  Location: [facility];  Service: Orthopedics;  Laterality: Left;   INTRAOPERATIVE TRANSESOPHAGEAL ECHOCARDIOGRAM N/A 05/09/2017   Procedure: INTRAOPERATIVE TRANSESOPHAGEAL ECHOCARDIOGRAM;  Surgeon: VanIvin PootD;  [facility] [place];  Laterality: N/A;   IR RADIOLOGIST EVAL & MGMT  12/04/2020   LEFT HEART CATH AND CORONARY ANGIOGRAPHY N/A 05/02/2017   Procedure: Left Heart Cath and Coronary Angiography;  Surgeon: AriWellington HampshireD;  [facility] [facility];  Service: Cardiovascular;  Laterality: N/A;   LOWER EXTREMITY ANGIOGRAPHY Left 02/09/2021   Procedure: LOWER EXTREMITY ANGIOGRAPHY;  Surgeon: DewAlgernon HuxleyD;  [place] [facility];  Service: Cardiovascular;  Laterality: Left;   LOWER EXTREMITY ANGIOGRAPHY Right 02/16/2021   Procedure: LOWER EXTREMITY ANGIOGRAPHY;  Surgeon: Algernon Huxley, MD;  Location: [place] CV LAB;  Service: Cardiovascular;  Laterality: Right;    Family History  Problem Relation Age of Onset   Breast cancer Maternal Aunt     Social History   Socioeconomic History   Marital status:  Married    Spouse name: [name]    Number of children: 1   Years of education: Not on file   Highest education level: Not on file  Occupational History   Not on file  Tobacco Use   Smoking status: Never   Smokeless tobacco: Never  Substance and Sexual Activity   Alcohol use: No   Drug use: No   Sexual activity: Not on file  Other Topics Concern   Not on file  Social History Narrative   Lives in [place] with her husband.  She is responsible for driving and grocery shopping.   Social Determinants of Health   Financial Resource Strain: Low Risk  (10/30/2021)   Overall Financial Resource Strain (CARDIA)    Difficulty of Paying Living Expenses: Not hard at all  Food Insecurity: No Food Insecurity (10/30/2021)   Hunger Vital Sign    Worried About Running Out of Food in the Last Year: Never true    Ran Out of Food in the Last Year: Never true  Transportation Needs: No Transportation Needs (10/30/2021)   PRAPARE - Hydrologist (Medical): No    Lack of Transportation (Non-Medical): No  Physical Activity: Sufficiently Active (10/30/2021)   Exercise Vital Sign    Days of Exercise per Week: 4 days    Minutes of Exercise per Session: 40 min  Stress: No Stress Concern Present (10/30/2021)   [place]    Feeling of Stress : Not at all  Social Connections: [place] (10/30/2021)   Social Connection and Isolation Panel [NHANES]    Frequency of Communication with Friends and Family: More than three times a week    Frequency of Social Gatherings with Friends and Family: More than three times a week    Attends Religious Services: More than 4 times per year    Active Member of [employer] or Organizations: Yes    Attends Music therapist: More than 4 times per year    Marital Status: Married  Human resources officer Violence: Not At Risk (10/30/2021)   Humiliation, Afraid, Rape, and Kick  questionnaire    Fear of Current or Ex-Partner: No    Emotionally Abused: No    Physically Abused: No    Sexually Abused: No     Current Outpatient Medications:    aspirin EC 81 MG tablet, Take 81 mg by mouth 2 (two) times daily., Disp: 90 tablet, Rfl: 3   atorvastatin (LIPITOR) 40 MG tablet, Take 1 tablet (40 mg total) by mouth daily., Disp: 90 tablet, Rfl: 3   BD INSULIN SYRINGE U/F 30G X 1/2" 0.5 ML MISC, , Disp: , Rfl:    benazepril (LOTENSIN) 40 MG tablet, TAKE 1 TABLET DAILY, Disp: 90 tablet, Rfl: 3   cholecalciferol (VITAMIN D) 1000 units tablet, Take 2,000 Units by mouth daily at 12 noon. , Disp: , Rfl:    cloNIDine (CATAPRES) 0.2 MG tablet, TAKE 1 TABLET TWICE A DAY, Disp: 90 tablet, Rfl: 1   clopidogrel (PLAVIX) 75 MG tablet, Take 1 tablet (75 mg  total) by mouth daily., Disp: 90 tablet, Rfl: 3   ferrous sulfate 325 (65 FE) MG EC tablet, Take 1 tablet (325 mg total) by mouth daily at 2 PM., Disp: 90 tablet, Rfl: 3   HUMALOG KWIKPEN 100 UNIT/ML KwikPen, INJECT 38 UNITS UNDER THE SKIN DAILY (Patient taking differently: 10-12 Units in the morning and at bedtime. 10-12 units BID prn), Disp: 45 mL, Rfl: 2   hydrochlorothiazide (MICROZIDE) 12.5 MG capsule, Take 12.5 mg by mouth daily., Disp: , Rfl:    insulin glargine (LANTUS) 100 UNIT/ML injection, INJECT 45 UNITS UNDER THE SKIN AT BEDTIME (Patient taking differently: 20 Units at bedtime. Pt takes 20 units depending on blood sugar level), Disp: 40 mL, Rfl: 3   levothyroxine (SYNTHROID) 100 MCG tablet, TAKE 1 TABLET DAILY BEFORE BREAKFAST, Disp: 90 tablet, Rfl: 3   Multiple Vitamin (MULTIVITAMIN) capsule, Take 1 capsule by mouth daily at 12 noon. , Disp: , Rfl:    nitroGLYCERIN (NITROSTAT) 0.4 MG SL tablet, Place 1 tablet (0.4 mg total) under the tongue every 5 (five) minutes as needed for chest pain., Disp: 25 tablet, Rfl: 3   Polyethyl Glycol-Propyl Glycol (SYSTANE OP), Apply 1 drop to eye as needed (dry eyes)., Disp: , Rfl:    valsartan  (DIOVAN) 320 MG tablet, Take 1 tablet (320 mg total) by mouth daily., Disp: 90 tablet, Rfl: 3   Allergies  Allergen Reactions   Shellfish Allergy Anaphylaxis    Throat swelling   Flexeril [Cyclobenzaprine] Hypertension    ROS Review of Systems  Constitutional: Negative.   HENT: Negative.    Eyes: Negative.   Respiratory: Negative.    Cardiovascular: Negative.  Negative for chest pain.  Gastrointestinal:  Positive for blood in stool. Negative for abdominal distention, abdominal pain, anal bleeding, constipation, diarrhea, nausea, rectal pain and vomiting.  Endocrine: Negative.   Genitourinary: Negative.   Musculoskeletal: Negative.  Negative for arthralgias.  Skin: Negative.   Allergic/Immunologic: Negative.   Neurological: Negative.   Hematological: Negative.   Psychiatric/Behavioral: Negative.    All other systems reviewed and are negative.     Objective:    Physical Exam Vitals reviewed.  Constitutional:      Appearance: Normal appearance.  HENT:     Mouth/Throat:     Mouth: Mucous membranes are moist.  Eyes:     Pupils: Pupils are equal, round, and reactive to light.  Neck:     Vascular: No carotid bruit.  Cardiovascular:     Rate and Rhythm: Normal rate and regular rhythm.     Pulses: Normal pulses.     Heart sounds: Normal heart sounds.  Pulmonary:     Effort: Pulmonary effort is normal.     Breath sounds: Normal breath sounds.  Abdominal:     General: Bowel sounds are normal.     Palpations: Abdomen is soft. There is no hepatomegaly, splenomegaly or mass.     Tenderness: There is no abdominal tenderness.     Hernia: No hernia is present.  Musculoskeletal:        General: No tenderness.     Cervical back: Neck supple.     Right lower leg: No edema.     Left lower leg: No edema.  Skin:    Findings: No rash.  Neurological:     Mental Status: She is alert and oriented to person, place, and time.     Motor: No weakness.  Psychiatric:        Mood and  Affect: Mood and  affect normal.        Behavior: Behavior normal.     BP 130/79   Pulse 68   Ht _0  (1.727 m)   Wt 145 lb (65.8 kg)   BMI 22.05 kg/m  Wt Readings from Last 3 Encounters:  07/14/22 145 lb (65.8 kg)  06/30/22 143 lb 3.2 oz (65 kg)  05/11/22 154 lb 1.6 oz (69.9 kg)     There are no preventive care reminders to display for this patient.  There are no preventive care reminders to display for this patient.  Lab Results  Component Value Date   TSH 0.620 08/25/2021   Lab Results  Component Value Date   WBC 5.1 06/30/2022   HGB 10.6 (L) 06/30/2022   HCT 31.7 (L) 06/30/2022   MCV 97 06/30/2022   PLT 232 06/30/2022   Lab Results  Component Value Date   NA 139 05/14/2022   K 3.7 05/14/2022   CO2 27 05/14/2022   GLUCOSE 111 (H) 05/14/2022   BUN 34 (H) 05/14/2022   CREATININE 1.17 (H) 05/14/2022   BILITOT 0.7 05/10/2022   ALKPHOS 53 05/10/2022   AST 28 05/10/2022   ALT 18 05/10/2022   PROT 6.4 (L) 05/10/2022   ALBUMIN 3.8 05/10/2022   CALCIUM 8.5 (L) 05/14/2022   ANIONGAP 7 05/14/2022   EGFR 69 08/25/2021   Lab Results  Component Value Date   CHOL 150 08/25/2021   Lab Results  Component Value Date   HDL 74 08/25/2021   Lab Results  Component Value Date   LDLCALC 62 08/25/2021   Lab Results  Component Value Date   TRIG 71 08/25/2021   Lab Results  Component Value Date   CHOLHDL 1.9 08/11/2020   Lab Results  Component Value Date   HGBA1C 8.8 (H) 05/10/2022      Assessment & Plan:   Problem List Items Addressed This Visit       Cardiovascular and Mediastinum   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) ([place])    Patient is doing well      Essential hypertension - Primary     Patient denies any chest pain or shortness of breath there is no history of palpitation or paroxysmal nocturnal dyspnea   patient was advised to follow low-salt low-cholesterol diet    ideally I want to keep systolic blood pressure below  130 mmHg, patient was asked to check blood pressure one times a week and give me a report on that.  Patient will be follow-up in 3 months  or earlier as needed, patient will call me back for any change in the cardiovascular symptoms Patient was advised to buy a book from local bookstore concerning blood pressure and read several chapters  every day.  This will be supplemented by some of the material we will give him from the office.  Patient should also utilize other resources like YouTube and Internet to learn more about the blood pressure and the diet.        Endocrine   Type 2 diabetes mellitus without complication, with long-term current use of insulin ([place])    - The patient's blood sugar is labile on med. - The patient will continue the current treatment regimen.  - I encouraged the patient to regularly check blood sugar.  - I encouraged the patient to monitor diet. I encouraged the patient to eat low-carb and low-sugar to help prevent blood sugar spikes.  - I encouraged the patient to continue following their  prescribed treatment plan for diabetes - I informed the patient to get help if blood sugar drops below 56m/dL, or if suddenly have trouble thinking clearly or breathing.  Patient was advised to buy a book on diabetes from a local bookstore or from [place]  Patient should read 2 chapters every day to keep the motivation going, this is in addition to some of the materials we provided them from the office.  There are other resources on the Internet like YouTube and wilkipedia to get an education on the diabetes      Hypothyroidism    We will check TSH        Other   Weight loss    Refer to GI      Iron deficiency anemia due to chronic blood loss    We will check ferritin level       No orders of the defined types were placed in this encounter.   Follow-up: No follow-ups on file.    JCletis Athens MD

## 2022-07-14 NOTE — Assessment & Plan Note (Signed)

## 2022-07-14 NOTE — Assessment & Plan Note (Signed)
Refer to GI 

## 2022-07-14 NOTE — Addendum Note (Signed)
Addended by: Alois Cliche on: 07/14/2022 12:37 PM   Modules accepted: Orders

## 2022-07-14 NOTE — Assessment & Plan Note (Signed)
We will check TSH

## 2022-07-14 NOTE — Assessment & Plan Note (Signed)

## 2022-07-14 NOTE — Assessment & Plan Note (Signed)
Patient is doing well

## 2022-07-14 NOTE — Assessment & Plan Note (Signed)
We will check ferritin level

## 2022-07-15 ENCOUNTER — Other Ambulatory Visit: Payer: Self-pay | Admitting: Internal Medicine

## 2022-07-22 LAB — COMPREHENSIVE METABOLIC PANEL
ALT: 14 IU/L (ref 0–32)
AST: 22 IU/L (ref 0–40)
Albumin/Globulin Ratio: 1.6 (ref 1.2–2.2)
Albumin: 3.9 g/dL (ref 3.8–4.8)
Alkaline Phosphatase: 110 IU/L (ref 44–121)
BUN/Creatinine Ratio: 15 (ref 12–28)
BUN: 11 mg/dL (ref 8–27)
Bilirubin Total: 0.4 mg/dL (ref 0.0–1.2)
CO2: 23 mmol/L (ref 20–29)
Calcium: 9.5 mg/dL (ref 8.7–10.3)
Chloride: 100 mmol/L (ref 96–106)
Creatinine, Ser: 0.75 mg/dL (ref 0.57–1.00)
Globulin, Total: 2.5 g/dL (ref 1.5–4.5)
Glucose: 195 mg/dL — ABNORMAL HIGH (ref 70–99)
Potassium: 4.7 mmol/L (ref 3.5–5.2)
Sodium: 138 mmol/L (ref 134–144)
Total Protein: 6.4 g/dL (ref 6.0–8.5)
eGFR: 81 mL/min/{1.73_m2} (ref >59)

## 2022-07-22 LAB — CBC WITH DIFFERENTIAL/PLATELET

## 2022-07-22 LAB — TSH: TSH: 0.747 u[IU]/mL (ref 0.450–4.500)

## 2022-07-23 LAB — IRON,TIBC AND FERRITIN PANEL
Ferritin: 524 ng/mL — ABNORMAL HIGH (ref 15–150)
Iron Saturation: 26 % (ref 15–55)
Iron: 72 ug/dL (ref 27–139)
Total Iron Binding Capacity: 277 ug/dL (ref 250–450)
UIBC: 205 ug/dL (ref 118–369)

## 2022-07-23 LAB — SPECIMEN STATUS REPORT

## 2022-07-27 ENCOUNTER — Other Ambulatory Visit: Payer: Self-pay | Admitting: Cardiovascular Disease

## 2022-07-28 ENCOUNTER — Ambulatory Visit: Payer: Medicare Other | Admitting: Internal Medicine

## 2022-08-04 ENCOUNTER — Encounter: Payer: Self-pay | Admitting: Gastroenterology

## 2022-08-05 ENCOUNTER — Encounter: Payer: Self-pay | Admitting: Anesthesiology

## 2022-08-05 ENCOUNTER — Ambulatory Visit
Admission: RE | Admit: 2022-08-05 | Discharge: 2022-08-05 | Disposition: A | Discharge status: Home / Self Care | Payer: Medicare Other | Source: Home / Self Care | Attending: Gastroenterology | Admitting: Gastroenterology

## 2022-08-05 ENCOUNTER — Telehealth: Payer: Self-pay

## 2022-08-05 ENCOUNTER — Encounter
Admission: RE | Disposition: A | Discharge status: Home / Self Care | Payer: Self-pay | Source: Home / Self Care | Attending: Gastroenterology

## 2022-08-05 DIAGNOSIS — Z5309 Procedure and treatment not carried out because of other contraindication: Secondary | ICD-10-CM | POA: Insufficient documentation

## 2022-08-05 DIAGNOSIS — E860 Dehydration: Secondary | ICD-10-CM | POA: Diagnosis not present

## 2022-08-05 DIAGNOSIS — Z1211 Encounter for screening for malignant neoplasm of colon: Secondary | ICD-10-CM | POA: Insufficient documentation

## 2022-08-05 DIAGNOSIS — Z7902 Long term (current) use of antithrombotics/antiplatelets: Secondary | ICD-10-CM | POA: Insufficient documentation

## 2022-08-05 DIAGNOSIS — A419 Sepsis, unspecified organism: Secondary | ICD-10-CM | POA: Diagnosis not present

## 2022-08-05 LAB — GLUCOSE, CAPILLARY: Glucose-Capillary: 146 mg/dL — ABNORMAL HIGH (ref 70–99)

## 2022-08-05 SURGERY — COLONOSCOPY WITH PROPOFOL
Anesthesia: General

## 2022-08-05 MED ORDER — SODIUM CHLORIDE 0.9 % IV SOLN: INTRAVENOUS | Status: DC

## 2022-08-05 NOTE — Progress Notes (Signed)
Procedure will be rescheduled since Carrie Kane took her Plavix yesterday. Also before leaving, anesthesiologist was notified since blood pressure was elevated, and states that it is okay for her to leave and take her usual blood pressure medications.

## 2022-08-05 NOTE — Telephone Encounter (Signed)
Patient contacted office to reschedule her colonoscopy due to Plavix advice not being received.  I apologized to her for the inconvenience that this has caused.  Informed her that I've attempted to message the provider and I've resent the Blood thinner request to the office this morning.  Advised that I will call her back once I receive her blood thinner advice back to reschedule her colonoscopy with Dr. Allen Norris.  Thanks, [place], [place]

## 2022-08-05 NOTE — Telephone Encounter (Signed)
Blood Thinner Information request originally sent on 07/06/22 to [facility].  Patient had Pre-op visit with Diona Browner, NP on 07/12/22 however we did not receive Plavix holding instructions, nor did the patient.  I've secure messaged NP to see if she could provide me with Plavix holding advise and I have also resent Blood Thinner Request to them.  I will reschedule patients colonoscopy once I've received holding advice.  Thanks,  [place], [place]

## 2022-08-05 NOTE — Anesthesia Preprocedure Evaluation (Signed)
Anesthesia Evaluation  Patient identified by MRN, date of birth, ID band Patient awake    Reviewed: Allergy & Precautions, NPO status , Patient's Chart, lab work & pertinent test results  History of Anesthesia Complications Negative for: history of anesthetic complications  Airway Mallampati: III  TM Distance: >3 FB Neck ROM: Full    Dental  (+) Teeth Intact   Pulmonary neg pulmonary ROS, neg sleep apnea, neg COPD, Patient abstained from smoking.Not current smoker,    Pulmonary exam normal breath sounds clear to auscultation       Cardiovascular Exercise Tolerance: Good METS: 3 - Mets hypertension, + CAD (s/p 3V CABG 2018), + CABG and + Peripheral Vascular Disease (s/p LE stents on Plavix, last dose 05/10/22)  (-) Past MI + dysrhythmias ( 02/2022 Zio: Predominantly sinus rhythm @ 61 (36-218). 2 NSVT runs (fastest/longest 6 beats @ 218). 37 SVT/A tach runs (fastest 185 x 5 beats, longest 32.4 secs @ 107). Triggered events = RSR, PAC.) Supra Ventricular Tachycardia + Valvular Problems/Murmurs (moderate TR) MR  Rhythm:Regular Rate:Normal - Systolic murmurs ECG 7/62/83: Sinus rhythm RBBB and LAFB Left ventricular hypertrophy Baseline wander in lead(s) II III aVF  TTE: 1. Left ventricular ejection fraction, by estimation, is 60 to 65%. The  left ventricle has normal function. The left ventricle has no regional  wall motion abnormalities. There is mild LVH with moderate asymmetric left  ventricular hypertrophy of the  basal-septal segment. Left ventricular diastolic parameters are consistent  with Grade I diastolic dysfunction (impaired relaxation).  2. Right ventricular systolic function is normal. The right ventricular  size is normal. There is normal pulmonary artery systolic pressure. The  estimated right ventricular systolic pressure is 15.1 mmHg.  3. Left atrial size was moderately dilated.  4. The mitral valve is normal in  structure. Mild mitral valve  regurgitation. No evidence of mitral stenosis. Moderate mitral annular  calcification.  5. Tricuspid valve regurgitation is moderate.  6. The aortic valve is normal in structure. Aortic valve regurgitation is  not visualized. No aortic stenosis is present.  7. There is mild dilatation of the ascending aorta, measuring 42 mm.  8. The inferior vena cava is normal in size with greater than 50%  respiratory variability, suggesting right atrial pressure of 3 mmHg.   04/2017 Cath: LM 80, LAD 80p, 67m LCX 95ost, 724mEF 45-50%; b. 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   Neuro/Psych 01/2022 R sided wkns/aphasia/tremor/slurred speech-->Ss resolved; b. 02/2022 MRI/A: punctate subacute inf vs artifact-post limb of L int capsule. 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 Mod, chronic small vessel isch changes within the cerebral white matter.  Subcm chronic infarct- L cerebellar hemisphere.  Sev dzs prox P2 seg of R PCA. Mod dzs A1 R ant cer art. CVA (01/2022, no residual symptoms), No Residual Symptoms negative psych ROS   GI/Hepatic neg GERD  ,(+)     (-) substance abuse  ,   Endo/Other  diabetes, Poorly Controlled, Type 2, Insulin DependentHypothyroidism   Renal/GU negative Renal ROS     Musculoskeletal   Abdominal   Peds  Hematology   Anesthesia Other Findings   Reproductive/Obstetrics                             Anesthesia Physical  Anesthesia Plan  ASA: 3  Anesthesia Plan: General   Post-op Pain Management: Minimal or no pain anticipated   Induction: Intravenous  PONV Risk Score and  Plan: 3 and Propofol infusion and Treatment may vary due to age or medical condition  Airway Management Planned: Natural Airway  Additional Equipment: None  Intra-op Plan:   Post-operative Plan:   Informed Consent:     Dental advisory given  Plan Discussed with: CRNA and  Surgeon  Anesthesia Plan Comments:         Anesthesia Quick Evaluation

## 2022-08-05 NOTE — H&P (Signed)
Lucilla Lame, MD [address] [place], [place] [address] Phone: [phone] Fax : [phone]  Primary Care Physician:  Cletis Athens, MD Primary Gastroenterologist:  Dr. Allen Norris  Pre-Procedure History & Physical: HPI:  Carrie Kane is a 81 y.o. female is here for a screening colonoscopy.   Past Medical History:  Diagnosis Date   CAD (coronary artery disease)    a. 04/2017 Cath: LM 64, LAD 80p, 57m LCX 95ost, 761mEF 45-50%; b. 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   Diastolic dysfunction    a. 04/2017 Echo: EF 55-60%, no rwma, Gr1 DD, mildly dil LA; b. 08/2020 Echo: EF 55-60%, no rwma, GrII DD, nl RV fxn, RVSP 3081m, mod dil LA, mildly dil RA, Mod TR.   Hyperlipidemia    Hypertensive heart disease    Insulin dependent diabetes mellitus    PAD (peripheral artery disease) ([facility]  a. 02/2021 PTA/DCBA R Peroneal, R Popliteal, distal R SFA.   PSVT (paroxysmal supraventricular tachycardia)    a. 02/2022 Zio: Predominantly sinus rhythm @ 61 (36-218). 2 NSVT runs (fastest/longest 6 beats @ 218). 37 SVT/A tach runs (fastest 185 x 5 beats, longest 32.4 secs @ 107). Triggered events = RSR, PAC.   [facility]  a. 01/2022 R sided wkns/aphasia/tremor/slurred speech-->Ss resolved; b. 02/2022 MRI/A: punctate subacute inf vs artifact-post limb of L int capsule. 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 Mod, chronic small vessel isch changes within the cerebral white matter.  Subcm chronic infarct- L cerebellar hemisphere.  Sev dzs prox P2 seg of R PCA. Mod dzs A1 R antClinical cytogeneticist  Past Surgical History:  Procedure Laterality Date   ABDOMINAL HYSTERECTOMY     APPLICATION OF WOUND VAC  05/11/2022   Procedure: APPLICATION OF WOUND VAC;  Surgeon: CraRenee HarderD;  Location: [facility] ORS;  Service: Orthopedics;;   CARDIAC CATHETERIZATION     CATARACT EXTRACTION W/PHACO Right 10/26/2021   Procedure: CATARACT EXTRACTION PHACO AND INTRAOCULAR  LENS PLACEMENT (IOC) RIGHT DIABETIC 16.27 01:23.8;  Surgeon: KinEulogio BearD;  [place] [facility];  Laterality: Right;  Please leave arrival at 8:00   CATARACT EXTRACTION W/PHACO Left 11/09/2021   Procedure: CATARACT EXTRACTION PHACO AND INTRAOCULAR LENS PLACEMENT (IOCMount VernonEFT DIABETIC;  Surgeon: KinEulogio BearD;  [place] [facility];  Laterality: Left;  13.39 01:13.0   CORONARY ARTERY BYPASS GRAFT N/A 05/09/2017   Procedure: CORONARY ARTERY BYPASS GRAFTING (CABG) x 3 using left internal mammary artery and right greater saphenous vein harvested endoscopically;  Surgeon: VanIvin PootD;  [facility] [place] [facility];  Laterality: N/A;   EYE SURGERY     HIP ARTHROPLASTY Left 05/11/2022   Procedure: ARTHROPLASTY BIPOLAR HIP (HEMIARTHROPLASTY);  Surgeon: CraRenee HarderD;  Location: [facility] OR[facility];  Service: Orthopedics;  Laterality: Left;   INTRAOPERATIVE TRANSESOPHAGEAL ECHOCARDIOGRAM N/A 05/09/2017   Procedure: INTRAOPERATIVE TRANSESOPHAGEAL ECHOCARDIOGRAM;  Surgeon: VanIvin PootD;  [facility] [place] [place] [facility];  Laterality: N/A;   IR RADIOLOGIST EVAL & MGMT  12/04/2020   LEFT HEART CATH AND CORONARY ANGIOGRAPHY N/A 05/02/2017   Procedure: Left Heart Cath and Coronary Angiography;  Surgeon: AriWellington HampshireD;  [place];  Service: Cardiovascular;  Laterality: N/A;   LOWER EXTREMITY ANGIOGRAPHY Left 02/09/2021   Procedure: LOWER EXTREMITY ANGIOGRAPHY;  Surgeon: DewAlgernon HuxleyD;  [place];  Service: Cardiovascular;  Laterality: Left;   LOWER EXTREMITY ANGIOGRAPHY Right 02/16/2021   Procedure: LOWER EXTREMITY ANGIOGRAPHY;  Surgeon: Algernon Huxley, MD;  Location: [place] [facility];  Service: Cardiovascular;  Laterality: Right;    Prior to Admission medications   Medication Sig Start Date End Date Taking? Authorizing Provider   aspirin EC 81 MG tablet Take 81 mg by mouth 2 (two) times daily. 08/02/17   Minna Merritts, MD  atorvastatin (LIPITOR) 40 MG tablet Take 1 tablet (40 mg total) by mouth daily. 06/23/21   Minna Merritts, MD  BD INSULIN SYRINGE U/F 30G X 1/2" 0.5 ML MISC  03/29/20   [provider]  benazepril (LOTENSIN) 40 MG tablet TAKE 1 TABLET DAILY 06/02/22   Cletis Athens, MD  cholecalciferol (VITAMIN D) 1000 units tablet Take 2,000 Units by mouth daily at 12 noon.     [provider]  cloNIDine (CATAPRES) 0.2 MG tablet TAKE 1 TABLET TWICE A DAY 06/02/22   Minna Merritts, MD  clopidogrel (PLAVIX) 75 MG tablet Take 1 tablet (75 mg total) by mouth daily. 06/23/21   Minna Merritts, MD  ferrous sulfate 325 (65 FE) MG EC tablet Take 1 tablet (325 mg total) by mouth daily at 2 PM. 12/23/20   Cletis Athens, MD  HUMALOG KWIKPEN 100 UNIT/ML KwikPen INJECT 38 UNITS UNDER THE SKIN DAILY Patient taking differently: 10-12 Units in the morning and at bedtime. 10-12 units BID prn 03/22/22   Cletis Athens, MD  hydrochlorothiazide (MICROZIDE) 12.5 MG capsule Take 12.5 mg by mouth daily.    [provider]  insulin glargine (LANTUS) 100 UNIT/ML injection INJECT 45 UNITS UNDER THE SKIN AT BEDTIME Patient taking differently: 20 Units at bedtime. Pt takes 20 units depending on blood sugar level 02/26/21   Beckie Salts, FNP  levothyroxine (SYNTHROID) 100 MCG tablet TAKE 1 TABLET DAILY BEFORE BREAKFAST 03/22/22   Cletis Athens, MD  Multiple Vitamin (MULTIVITAMIN) capsule Take 1 capsule by mouth daily at 12 noon.     [provider]  nitroGLYCERIN (NITROSTAT) 0.4 MG SL tablet Place 1 tablet (0.4 mg total) under the tongue every 5 (five) minutes as needed for chest pain. 06/23/21   Minna Merritts, MD  Polyethyl Glycol-Propyl Glycol (SYSTANE OP) Apply 1 drop to eye as needed (dry eyes).    [provider]  valsartan (DIOVAN) 320 MG tablet TAKE 1 TABLET DAILY 07/27/22   Minna Merritts, MD     Allergies as of 07/06/2022 - Review Complete 06/30/2022  Allergen Reaction Noted   Shellfish allergy Anaphylaxis 11/09/2021   Flexeril [cyclobenzaprine] Hypertension 09/20/2013    Family History  Problem Relation Age of Onset   Breast cancer Maternal Aunt     Social History   Socioeconomic History   Marital status: Married    Spouse name: [name]    Number of children: 1   Years of education: Not on file   Highest education level: Not on file  Occupational History   Not on file  Tobacco Use   Smoking status: Never   Smokeless tobacco: Never  Vaping Use   Vaping Use: Never used  Substance and Sexual Activity   Alcohol use: No   Drug use: No   Sexual activity: Not on file  Other Topics Concern   Not on file  Social History Narrative   Lives in [place] with her husband.  She is responsible for driving and grocery shopping.   Social Determinants of Health  Financial Resource Strain: Low Risk  (10/30/2021)   Overall Financial Resource Strain (CARDIA)    Difficulty of Paying Living Expenses: Not hard at all  Food Insecurity: No Food Insecurity (10/30/2021)   Hunger Vital Sign    Worried About Running Out of Food in the Last Year: Never true    Ran Out of Food in the Last Year: Never true  Transportation Needs: No Transportation Needs (10/30/2021)   PRAPARE - Hydrologist (Medical): No    Lack of Transportation (Non-Medical): No  Physical Activity: Sufficiently Active (10/30/2021)   Exercise Vital Sign    Days of Exercise per Week: 4 days    Minutes of Exercise per Session: 40 min  Stress: No Stress Concern Present (10/30/2021)   [place]    Feeling of Stress : Not at all  Social Connections: [place] (10/30/2021)   Social Connection and Isolation Panel [NHANES]    Frequency of Communication with Friends and Family: More than three times a week    Frequency  of Social Gatherings with Friends and Family: More than three times a week    Attends Religious Services: More than 4 times per year    Active Member of [employer] or Organizations: Yes    Attends Music therapist: More than 4 times per year    Marital Status: Married  Human resources officer Violence: Not At Risk (10/30/2021)   Humiliation, Afraid, Rape, and Kick questionnaire    Fear of Current or Ex-Partner: No    Emotionally Abused: No    Physically Abused: No    Sexually Abused: No    Review of Systems: See HPI, otherwise negative ROS  Physical Exam: BP (!) 203/88   Pulse 95   Temp (!) 97.4 F (36.3 C) (Temporal)   Resp 17   Ht '5\' 8"'$  (1.727 m)   Wt 69.9 kg   SpO2 100%   BMI 23.42 kg/m  General:   Alert,  pleasant and cooperative in NAD Head:  Normocephalic and atraumatic. Neck:  Supple; no masses or thyromegaly. Lungs:  Clear throughout to auscultation.    Heart:  Regular rate and rhythm. Abdomen:  Soft, nontender and nondistended. Normal bowel sounds, without guarding, and without rebound.   Neurologic:  Alert and  oriented x4;  grossly normal neurologically.  Impression/Plan: Carrie Kane is now here to undergo a screening colonoscopy.  Risks, benefits, and alternatives regarding colonoscopy have been reviewed with the patient.  Questions have been answered.  All parties agreeable.

## 2022-08-08 ENCOUNTER — Emergency Department: Payer: Medicare Other

## 2022-08-08 ENCOUNTER — Inpatient Hospital Stay: Payer: Medicare Other

## 2022-08-08 ENCOUNTER — Inpatient Hospital Stay
Admission: EM | Admit: 2022-08-08 | Discharge: 2022-08-16 | DRG: 871 | Disposition: A | Discharge status: Skilled Nursing Facility | Payer: Medicare Other | Attending: Family Medicine | Admitting: Family Medicine

## 2022-08-08 ENCOUNTER — Other Ambulatory Visit: Payer: Self-pay

## 2022-08-08 ENCOUNTER — Inpatient Hospital Stay (HOSPITAL_COMMUNITY)
Admit: 2022-08-08 | Discharge: 2022-08-08 | Disposition: A | Discharge status: Home / Self Care | Payer: Medicare Other | Attending: Nurse Practitioner | Admitting: Nurse Practitioner

## 2022-08-08 DIAGNOSIS — I5030 Unspecified diastolic (congestive) heart failure: Secondary | ICD-10-CM | POA: Diagnosis not present

## 2022-08-08 DIAGNOSIS — I214 Non-ST elevation (NSTEMI) myocardial infarction: Secondary | ICD-10-CM | POA: Diagnosis present

## 2022-08-08 DIAGNOSIS — N179 Acute kidney failure, unspecified: Secondary | ICD-10-CM | POA: Diagnosis not present

## 2022-08-08 DIAGNOSIS — I480 Paroxysmal atrial fibrillation: Secondary | ICD-10-CM

## 2022-08-08 DIAGNOSIS — Z96642 Presence of left artificial hip joint: Secondary | ICD-10-CM | POA: Diagnosis present

## 2022-08-08 DIAGNOSIS — R490 Dysphonia: Secondary | ICD-10-CM | POA: Diagnosis not present

## 2022-08-08 DIAGNOSIS — A419 Sepsis, unspecified organism: Principal | ICD-10-CM

## 2022-08-08 DIAGNOSIS — I708 Atherosclerosis of other arteries: Secondary | ICD-10-CM | POA: Diagnosis present

## 2022-08-08 DIAGNOSIS — R4182 Altered mental status, unspecified: Principal | ICD-10-CM

## 2022-08-08 DIAGNOSIS — Z9842 Cataract extraction status, left eye: Secondary | ICD-10-CM

## 2022-08-08 DIAGNOSIS — I2510 Atherosclerotic heart disease of native coronary artery without angina pectoris: Secondary | ICD-10-CM | POA: Diagnosis not present

## 2022-08-08 DIAGNOSIS — I5032 Chronic diastolic (congestive) heart failure: Secondary | ICD-10-CM | POA: Diagnosis present

## 2022-08-08 DIAGNOSIS — D649 Anemia, unspecified: Secondary | ICD-10-CM | POA: Diagnosis present

## 2022-08-08 DIAGNOSIS — Z7982 Long term (current) use of aspirin: Secondary | ICD-10-CM

## 2022-08-08 DIAGNOSIS — E440 Moderate protein-calorie malnutrition: Secondary | ICD-10-CM | POA: Diagnosis present

## 2022-08-08 DIAGNOSIS — E1110 Type 2 diabetes mellitus with ketoacidosis without coma: Secondary | ICD-10-CM

## 2022-08-08 DIAGNOSIS — R571 Hypovolemic shock: Secondary | ICD-10-CM | POA: Diagnosis present

## 2022-08-08 DIAGNOSIS — I110 Hypertensive heart disease with heart failure: Secondary | ICD-10-CM | POA: Diagnosis present

## 2022-08-08 DIAGNOSIS — J9601 Acute respiratory failure with hypoxia: Secondary | ICD-10-CM | POA: Diagnosis present

## 2022-08-08 DIAGNOSIS — K8010 Calculus of gallbladder with chronic cholecystitis without obstruction: Secondary | ICD-10-CM | POA: Diagnosis present

## 2022-08-08 DIAGNOSIS — Z7902 Long term (current) use of antithrombotics/antiplatelets: Secondary | ICD-10-CM

## 2022-08-08 DIAGNOSIS — Z951 Presence of aortocoronary bypass graft: Secondary | ICD-10-CM

## 2022-08-08 DIAGNOSIS — I471 Supraventricular tachycardia, unspecified: Secondary | ICD-10-CM | POA: Diagnosis present

## 2022-08-08 DIAGNOSIS — E039 Hypothyroidism, unspecified: Secondary | ICD-10-CM | POA: Diagnosis present

## 2022-08-08 DIAGNOSIS — Z9841 Cataract extraction status, right eye: Secondary | ICD-10-CM

## 2022-08-08 DIAGNOSIS — Z91013 Allergy to seafood: Secondary | ICD-10-CM

## 2022-08-08 DIAGNOSIS — R6521 Severe sepsis with septic shock: Secondary | ICD-10-CM | POA: Diagnosis not present

## 2022-08-08 DIAGNOSIS — Z1152 Encounter for screening for COVID-19: Secondary | ICD-10-CM

## 2022-08-08 DIAGNOSIS — Z888 Allergy status to other drugs, medicaments and biological substances status: Secondary | ICD-10-CM

## 2022-08-08 DIAGNOSIS — Z794 Long term (current) use of insulin: Secondary | ICD-10-CM

## 2022-08-08 DIAGNOSIS — E873 Alkalosis: Secondary | ICD-10-CM | POA: Diagnosis present

## 2022-08-08 DIAGNOSIS — E0590 Thyrotoxicosis, unspecified without thyrotoxic crisis or storm: Secondary | ICD-10-CM | POA: Diagnosis present

## 2022-08-08 DIAGNOSIS — I25119 Atherosclerotic heart disease of native coronary artery with unspecified angina pectoris: Secondary | ICD-10-CM | POA: Diagnosis not present

## 2022-08-08 DIAGNOSIS — I4510 Unspecified right bundle-branch block: Secondary | ICD-10-CM | POA: Diagnosis present

## 2022-08-08 DIAGNOSIS — I493 Ventricular premature depolarization: Secondary | ICD-10-CM | POA: Diagnosis present

## 2022-08-08 DIAGNOSIS — Z803 Family history of malignant neoplasm of breast: Secondary | ICD-10-CM

## 2022-08-08 DIAGNOSIS — E1151 Type 2 diabetes mellitus with diabetic peripheral angiopathy without gangrene: Secondary | ICD-10-CM | POA: Diagnosis present

## 2022-08-08 DIAGNOSIS — Z961 Presence of intraocular lens: Secondary | ICD-10-CM | POA: Diagnosis present

## 2022-08-08 DIAGNOSIS — E860 Dehydration: Secondary | ICD-10-CM | POA: Diagnosis present

## 2022-08-08 DIAGNOSIS — Z7901 Long term (current) use of anticoagulants: Secondary | ICD-10-CM

## 2022-08-08 DIAGNOSIS — J479 Bronchiectasis, uncomplicated: Secondary | ICD-10-CM | POA: Diagnosis present

## 2022-08-08 DIAGNOSIS — Z8673 Personal history of transient ischemic attack (TIA), and cerebral infarction without residual deficits: Secondary | ICD-10-CM

## 2022-08-08 DIAGNOSIS — N170 Acute kidney failure with tubular necrosis: Secondary | ICD-10-CM | POA: Diagnosis present

## 2022-08-08 DIAGNOSIS — I452 Bifascicular block: Secondary | ICD-10-CM | POA: Diagnosis present

## 2022-08-08 DIAGNOSIS — E11649 Type 2 diabetes mellitus with hypoglycemia without coma: Secondary | ICD-10-CM | POA: Diagnosis not present

## 2022-08-08 DIAGNOSIS — E785 Hyperlipidemia, unspecified: Secondary | ICD-10-CM | POA: Diagnosis present

## 2022-08-08 DIAGNOSIS — Z79890 Hormone replacement therapy (postmenopausal): Secondary | ICD-10-CM

## 2022-08-08 DIAGNOSIS — I25110 Atherosclerotic heart disease of native coronary artery with unstable angina pectoris: Secondary | ICD-10-CM | POA: Diagnosis present

## 2022-08-08 DIAGNOSIS — G928 Other toxic encephalopathy: Secondary | ICD-10-CM | POA: Diagnosis present

## 2022-08-08 DIAGNOSIS — G9341 Metabolic encephalopathy: Secondary | ICD-10-CM | POA: Diagnosis present

## 2022-08-08 DIAGNOSIS — Z90710 Acquired absence of both cervix and uterus: Secondary | ICD-10-CM

## 2022-08-08 DIAGNOSIS — Z79899 Other long term (current) drug therapy: Secondary | ICD-10-CM

## 2022-08-08 LAB — URINALYSIS, COMPLETE (UACMP) WITH MICROSCOPIC
Bilirubin Urine: NEGATIVE
Glucose, UA: >=500 mg/dL — AB
Hgb urine dipstick: NEGATIVE
Ketones, ur: 20 mg/dL — AB
Leukocytes,Ua: NEGATIVE
Nitrite: NEGATIVE
Protein, ur: NEGATIVE mg/dL
Specific Gravity, Urine: 1.021 (ref 1.005–1.030)
pH: 5 (ref 5.0–8.0)

## 2022-08-08 LAB — ECHOCARDIOGRAM COMPLETE
AR max vel: 2.02 cm2
AV Area VTI: 2.01 cm2
AV Area mean vel: 2.01 cm2
AV Mean grad: 8 mmHg
AV Peak grad: 14.4 mmHg
Ao pk vel: 1.9 m/s
Area-P 1/2: 3.54 cm2
Calc EF: 65 %
Height: 68 in
MV VTI: 1.49 cm2
S' Lateral: 2.1 cm
Single Plane A2C EF: 62.8 %
Single Plane A4C EF: 68.5 %
Weight: 2240 oz

## 2022-08-08 LAB — CBC WITH DIFFERENTIAL/PLATELET
Abs Immature Granulocytes: 0.4 10*3/uL — ABNORMAL HIGH (ref 0.00–0.07)
Abs Immature Granulocytes: 0.48 10*3/uL — ABNORMAL HIGH (ref 0.00–0.07)
Basophils Absolute: 0.1 10*3/uL (ref 0.0–0.1)
Basophils Absolute: 0.1 10*3/uL (ref 0.0–0.1)
Basophils Relative: 0 %
Basophils Relative: 0 %
Eosinophils Absolute: 0.1 10*3/uL (ref 0.0–0.5)
Eosinophils Absolute: 0.1 10*3/uL (ref 0.0–0.5)
Eosinophils Relative: 0 %
Eosinophils Relative: 1 %
HCT: 34.8 % — ABNORMAL LOW (ref 36.0–46.0)
HCT: 30.2 % — ABNORMAL LOW (ref 36.0–46.0)
Hemoglobin: 9.8 g/dL — ABNORMAL LOW (ref 12.0–15.0)
Hemoglobin: 8.9 g/dL — ABNORMAL LOW (ref 12.0–15.0)
Immature Granulocytes: 2 %
Immature Granulocytes: 2 %
Lymphocytes Relative: 18 %
Lymphocytes Relative: 19 %
Lymphs Abs: 3.5 10*3/uL (ref 0.7–4.0)
Lymphs Abs: 4 10*3/uL (ref 0.7–4.0)
MCH: 29.8 pg (ref 26.0–34.0)
MCH: 29.3 pg (ref 26.0–34.0)
MCHC: 28.2 g/dL — ABNORMAL LOW (ref 30.0–36.0)
MCHC: 29.5 g/dL — ABNORMAL LOW (ref 30.0–36.0)
MCV: 105.8 fL — ABNORMAL HIGH (ref 80.0–100.0)
MCV: 99.3 fL (ref 80.0–100.0)
Monocytes Absolute: 0.9 10*3/uL (ref 0.1–1.0)
Monocytes Absolute: 0.3 10*3/uL (ref 0.1–1.0)
Monocytes Relative: 5 %
Monocytes Relative: 2 %
Neutro Abs: 14.1 10*3/uL — ABNORMAL HIGH (ref 1.7–7.7)
Neutro Abs: 16 10*3/uL — ABNORMAL HIGH (ref 1.7–7.7)
Neutrophils Relative %: 75 %
Neutrophils Relative %: 76 %
Platelets: 214 10*3/uL (ref 150–400)
Platelets: 195 10*3/uL (ref 150–400)
RBC: 3.29 MIL/uL — ABNORMAL LOW (ref 3.87–5.11)
RBC: 3.04 MIL/uL — ABNORMAL LOW (ref 3.87–5.11)
RDW: 14.4 % (ref 11.5–15.5)
RDW: 14.5 % (ref 11.5–15.5)
WBC: 19 10*3/uL — ABNORMAL HIGH (ref 4.0–10.5)
WBC: 21.1 10*3/uL — ABNORMAL HIGH (ref 4.0–10.5)
nRBC: 0 % (ref 0.0–0.2)
nRBC: 0 % (ref 0.0–0.2)

## 2022-08-08 LAB — BLOOD GAS, ARTERIAL
Acid-base deficit: 23.7 mmol/L — ABNORMAL HIGH (ref 0.0–2.0)
Acid-base deficit: 2.4 mmol/L — ABNORMAL HIGH (ref 0.0–2.0)
Bicarbonate: 4.8 mmol/L — ABNORMAL LOW (ref 20.0–28.0)
Bicarbonate: 21.6 mmol/L (ref 20.0–28.0)
FIO2: 40 %
FIO2: 40 %
MECHVT: 400 mL
MECHVT: 400 mL
Mechanical Rate: 26
Mechanical Rate: 26
O2 Saturation: 100 %
O2 Saturation: 100 %
PEEP: 5 cmH2O
PEEP: 5 cmH2O
Patient temperature: 37
Patient temperature: 37
pCO2 arterial: <18 mmHg — CL (ref 32–48)
pCO2 arterial: 34 mmHg (ref 32–48)
pH, Arterial: 7.06 — CL (ref 7.35–7.45)
pH, Arterial: 7.41 (ref 7.35–7.45)
pO2, Arterial: 179 mmHg — ABNORMAL HIGH (ref 83–108)
pO2, Arterial: 183 mmHg — ABNORMAL HIGH (ref 83–108)

## 2022-08-08 LAB — COMPREHENSIVE METABOLIC PANEL
ALT: 38 U/L (ref 0–44)
AST: 105 U/L — ABNORMAL HIGH (ref 15–41)
Albumin: 3.9 g/dL (ref 3.5–5.0)
Alkaline Phosphatase: 88 U/L (ref 38–126)
BUN: 33 mg/dL — ABNORMAL HIGH (ref 8–23)
CO2: <7 mmol/L — ABNORMAL LOW (ref 22–32)
Calcium: 9.8 mg/dL (ref 8.9–10.3)
Chloride: 93 mmol/L — ABNORMAL LOW (ref 98–111)
Creatinine, Ser: 1.73 mg/dL — ABNORMAL HIGH (ref 0.44–1.00)
GFR, Estimated: 30 mL/min — ABNORMAL LOW (ref >60)
Glucose, Bld: 830 mg/dL (ref 70–99)
Potassium: 3.8 mmol/L (ref 3.5–5.1)
Sodium: 137 mmol/L (ref 135–145)
Total Bilirubin: 1.8 mg/dL — ABNORMAL HIGH (ref 0.3–1.2)
Total Protein: 6.6 g/dL (ref 6.5–8.1)

## 2022-08-08 LAB — BETA-HYDROXYBUTYRIC ACID
Beta-Hydroxybutyric Acid: >8 mmol/L — ABNORMAL HIGH (ref 0.05–0.27)
Beta-Hydroxybutyric Acid: >8 mmol/L — ABNORMAL HIGH (ref 0.05–0.27)
Beta-Hydroxybutyric Acid: 0.09 mmol/L (ref 0.05–0.27)

## 2022-08-08 LAB — BLOOD GAS, VENOUS
Acid-base deficit: 25.2 mmol/L — ABNORMAL HIGH (ref 0.0–2.0)
Bicarbonate: 5.3 mmol/L — ABNORMAL LOW (ref 20.0–28.0)
O2 Saturation: 91.7 %
Patient temperature: 37
pCO2, Ven: 23 mmHg — ABNORMAL LOW (ref 44–60)
pH, Ven: 6.97 — CL (ref 7.25–7.43)
pO2, Ven: 75 mmHg — ABNORMAL HIGH (ref 32–45)

## 2022-08-08 LAB — BASIC METABOLIC PANEL
Anion gap: 31 — ABNORMAL HIGH (ref 5–15)
Anion gap: 23 — ABNORMAL HIGH (ref 5–15)
Anion gap: 16 — ABNORMAL HIGH (ref 5–15)
Anion gap: 9 (ref 5–15)
BUN: 35 mg/dL — ABNORMAL HIGH (ref 8–23)
BUN: 35 mg/dL — ABNORMAL HIGH (ref 8–23)
BUN: 34 mg/dL — ABNORMAL HIGH (ref 8–23)
BUN: 33 mg/dL — ABNORMAL HIGH (ref 8–23)
CO2: 13 mmol/L — ABNORMAL LOW (ref 22–32)
CO2: 21 mmol/L — ABNORMAL LOW (ref 22–32)
CO2: 27 mmol/L (ref 22–32)
CO2: 30 mmol/L (ref 22–32)
Calcium: 8.7 mg/dL — ABNORMAL LOW (ref 8.9–10.3)
Calcium: 8.7 mg/dL — ABNORMAL LOW (ref 8.9–10.3)
Calcium: 8.9 mg/dL (ref 8.9–10.3)
Calcium: 8.4 mg/dL — ABNORMAL LOW (ref 8.9–10.3)
Chloride: 96 mmol/L — ABNORMAL LOW (ref 98–111)
Chloride: 95 mmol/L — ABNORMAL LOW (ref 98–111)
Chloride: 96 mmol/L — ABNORMAL LOW (ref 98–111)
Chloride: 97 mmol/L — ABNORMAL LOW (ref 98–111)
Creatinine, Ser: 1.85 mg/dL — ABNORMAL HIGH (ref 0.44–1.00)
Creatinine, Ser: 1.78 mg/dL — ABNORMAL HIGH (ref 0.44–1.00)
Creatinine, Ser: 1.79 mg/dL — ABNORMAL HIGH (ref 0.44–1.00)
Creatinine, Ser: 1.68 mg/dL — ABNORMAL HIGH (ref 0.44–1.00)
GFR, Estimated: 27 mL/min — ABNORMAL LOW (ref >60)
GFR, Estimated: 29 mL/min — ABNORMAL LOW (ref >60)
GFR, Estimated: 28 mL/min — ABNORMAL LOW (ref >60)
GFR, Estimated: 31 mL/min — ABNORMAL LOW (ref >60)
Glucose, Bld: 735 mg/dL (ref 70–99)
Glucose, Bld: 468 mg/dL — ABNORMAL HIGH (ref 70–99)
Glucose, Bld: 280 mg/dL — ABNORMAL HIGH (ref 70–99)
Glucose, Bld: 119 mg/dL — ABNORMAL HIGH (ref 70–99)
Potassium: 3.5 mmol/L (ref 3.5–5.1)
Potassium: 2.7 mmol/L — CL (ref 3.5–5.1)
Potassium: 2.7 mmol/L — CL (ref 3.5–5.1)
Potassium: 3.6 mmol/L (ref 3.5–5.1)
Sodium: 140 mmol/L (ref 135–145)
Sodium: 139 mmol/L (ref 135–145)
Sodium: 139 mmol/L (ref 135–145)
Sodium: 136 mmol/L (ref 135–145)

## 2022-08-08 LAB — GLUCOSE, CAPILLARY
Glucose-Capillary: 571 mg/dL (ref 70–99)
Glucose-Capillary: 441 mg/dL — ABNORMAL HIGH (ref 70–99)
Glucose-Capillary: 584 mg/dL (ref 70–99)
Glucose-Capillary: >600 mg/dL (ref 70–99)
Glucose-Capillary: 583 mg/dL (ref 70–99)
Glucose-Capillary: 483 mg/dL — ABNORMAL HIGH (ref 70–99)
Glucose-Capillary: 564 mg/dL (ref 70–99)
Glucose-Capillary: 532 mg/dL (ref 70–99)
Glucose-Capillary: 491 mg/dL — ABNORMAL HIGH (ref 70–99)
Glucose-Capillary: 512 mg/dL (ref 70–99)
Glucose-Capillary: 439 mg/dL — ABNORMAL HIGH (ref 70–99)
Glucose-Capillary: 440 mg/dL — ABNORMAL HIGH (ref 70–99)
Glucose-Capillary: 224 mg/dL — ABNORMAL HIGH (ref 70–99)
Glucose-Capillary: 357 mg/dL — ABNORMAL HIGH (ref 70–99)
Glucose-Capillary: 340 mg/dL — ABNORMAL HIGH (ref 70–99)
Glucose-Capillary: 258 mg/dL — ABNORMAL HIGH (ref 70–99)
Glucose-Capillary: 223 mg/dL — ABNORMAL HIGH (ref 70–99)
Glucose-Capillary: 246 mg/dL — ABNORMAL HIGH (ref 70–99)
Glucose-Capillary: 135 mg/dL — ABNORMAL HIGH (ref 70–99)
Glucose-Capillary: 127 mg/dL — ABNORMAL HIGH (ref 70–99)
Glucose-Capillary: 117 mg/dL — ABNORMAL HIGH (ref 70–99)
Glucose-Capillary: 105 mg/dL — ABNORMAL HIGH (ref 70–99)
Glucose-Capillary: 119 mg/dL — ABNORMAL HIGH (ref 70–99)

## 2022-08-08 LAB — PHOSPHORUS: Phosphorus: 8.1 mg/dL — ABNORMAL HIGH (ref 2.5–4.6)

## 2022-08-08 LAB — T4, FREE: Free T4: 1.4 ng/dL — ABNORMAL HIGH (ref 0.61–1.12)

## 2022-08-08 LAB — PROCALCITONIN: Procalcitonin: 0.99 ng/mL

## 2022-08-08 LAB — PROTIME-INR
INR: 1.2 (ref 0.8–1.2)
Prothrombin Time: 15.5 seconds — ABNORMAL HIGH (ref 11.4–15.2)

## 2022-08-08 LAB — APTT: aPTT: 27 seconds (ref 24–36)

## 2022-08-08 LAB — LACTIC ACID, PLASMA
Lactic Acid, Venous: >9 mmol/L (ref 0.5–1.9)
Lactic Acid, Venous: >9 mmol/L (ref 0.5–1.9)

## 2022-08-08 LAB — HEPARIN LEVEL (UNFRACTIONATED)
Heparin Unfractionated: 0.62 IU/mL (ref 0.30–0.70)
Heparin Unfractionated: 0.68 IU/mL (ref 0.30–0.70)

## 2022-08-08 LAB — TROPONIN I (HIGH SENSITIVITY)
Troponin I (High Sensitivity): 3038 ng/L (ref <18)
Troponin I (High Sensitivity): 15838 ng/L (ref <18)

## 2022-08-08 LAB — TSH: TSH: 0.272 u[IU]/mL — ABNORMAL LOW (ref 0.350–4.500)

## 2022-08-08 LAB — SARS CORONAVIRUS 2 BY RT PCR: SARS Coronavirus 2 by RT PCR: NEGATIVE

## 2022-08-08 LAB — BRAIN NATRIURETIC PEPTIDE: B Natriuretic Peptide: 261.2 pg/mL — ABNORMAL HIGH (ref 0.0–100.0)

## 2022-08-08 LAB — CORTISOL-AM, BLOOD: Cortisol - AM: 60.7 ug/dL — ABNORMAL HIGH (ref 6.7–22.6)

## 2022-08-08 LAB — MRSA NEXT GEN BY PCR, NASAL: MRSA by PCR Next Gen: NOT DETECTED

## 2022-08-08 LAB — MAGNESIUM: Magnesium: 2.1 mg/dL (ref 1.7–2.4)

## 2022-08-08 MED ORDER — DOCUSATE SODIUM 50 MG/5ML PO LIQD
100.0000 mg | Freq: Two times a day (BID) | ORAL | Status: DC
  Administered 2022-08-08 – 2022-08-10 (×5): 100 mg
  Filled 2022-08-08 (×5): qty 10

## 2022-08-08 MED ORDER — SODIUM BICARBONATE 8.4 % IV SOLN
100.0000 meq | Freq: Once | INTRAVENOUS | Status: AC
  Administered 2022-08-08: 100 meq via INTRAVENOUS
  Filled 2022-08-08: qty 100

## 2022-08-08 MED ORDER — MIDAZOLAM-SODIUM CHLORIDE 100-0.9 MG/100ML-% IV SOLN
5.0000 mg/h | INTRAVENOUS | Status: DC
  Administered 2022-08-08: 5 mg/h via INTRAVENOUS
  Filled 2022-08-08: qty 100

## 2022-08-08 MED ORDER — DEXTROSE 50 % IV SOLN
0.0000 mL | INTRAVENOUS | Status: DC | PRN
  Administered 2022-08-09: 50 mL via INTRAVENOUS

## 2022-08-08 MED ORDER — SODIUM CHLORIDE 0.9 % IV SOLN
2.0000 g | INTRAVENOUS | Status: DC
  Administered 2022-08-08 – 2022-08-11 (×4): 2 g via INTRAVENOUS
  Filled 2022-08-08 (×4): qty 2

## 2022-08-08 MED ORDER — POLYETHYLENE GLYCOL 3350 17 G PO PACK
17.0000 g | PACK | Freq: Every day | ORAL | Status: DC
  Administered 2022-08-08 – 2022-08-10 (×3): 17 g
  Filled 2022-08-08 (×3): qty 1

## 2022-08-08 MED ORDER — NOREPINEPHRINE 16 MG/250ML-% IV SOLN
0.0000 ug/min | INTRAVENOUS | Status: DC
  Administered 2022-08-08: 5 ug/min via INTRAVENOUS
  Administered 2022-08-08: 19 ug/min via INTRAVENOUS
  Administered 2022-08-09: 25 ug/min via INTRAVENOUS
  Administered 2022-08-09: 16 ug/min via INTRAVENOUS
  Filled 2022-08-08 (×4): qty 250

## 2022-08-08 MED ORDER — METRONIDAZOLE 500 MG/100ML IV SOLN
500.0000 mg | Freq: Three times a day (TID) | INTRAVENOUS | Status: AC
  Administered 2022-08-08 – 2022-08-13 (×16): 500 mg via INTRAVENOUS
  Filled 2022-08-08 (×17): qty 100

## 2022-08-08 MED ORDER — VANCOMYCIN HCL 1500 MG/300ML IV SOLN
1500.0000 mg | Freq: Once | INTRAVENOUS | Status: AC
  Administered 2022-08-08: 1500 mg via INTRAVENOUS
  Filled 2022-08-08: qty 300

## 2022-08-08 MED ORDER — LACTATED RINGERS IV BOLUS: 1000.0000 mL | Freq: Once | INTRAVENOUS | Status: DC

## 2022-08-08 MED ORDER — POLYETHYLENE GLYCOL 3350 17 G PO PACK: 17.0000 g | PACK | Freq: Every day | ORAL | Status: DC | PRN

## 2022-08-08 MED ORDER — INSULIN GLARGINE-YFGN 100 UNIT/ML ~~LOC~~ SOLN
10.0000 [IU] | Freq: Every day | SUBCUTANEOUS | Status: DC
  Administered 2022-08-09: 10 [IU] via SUBCUTANEOUS
  Filled 2022-08-08: qty 0.1

## 2022-08-08 MED ORDER — HEPARIN SODIUM (PORCINE) 5000 UNIT/ML IJ SOLN: 60.0000 [IU]/kg | Freq: Once | INTRAMUSCULAR | Status: DC

## 2022-08-08 MED ORDER — HEPARIN BOLUS VIA INFUSION
3800.0000 [IU] | Freq: Once | INTRAVENOUS | Status: AC
  Administered 2022-08-08: 3800 [IU] via INTRAVENOUS
  Filled 2022-08-08: qty 3800

## 2022-08-08 MED ORDER — STERILE WATER FOR INJECTION IV SOLN
INTRAVENOUS | Status: DC
  Filled 2022-08-08 (×5): qty 1000

## 2022-08-08 MED ORDER — ROCURONIUM BROMIDE 10 MG/ML (PF) SYRINGE
100.0000 mg | PREFILLED_SYRINGE | Freq: Once | INTRAVENOUS | Status: AC
  Administered 2022-08-08: 100 mg via INTRAVENOUS

## 2022-08-08 MED ORDER — DOCUSATE SODIUM 100 MG PO CAPS: 100.0000 mg | ORAL_CAPSULE | Freq: Two times a day (BID) | ORAL | Status: DC | PRN

## 2022-08-08 MED ORDER — SODIUM CHLORIDE 0.9 % IV SOLN
2.0000 g | Freq: Once | INTRAVENOUS | Status: AC
  Administered 2022-08-08: 2 g via INTRAVENOUS
  Filled 2022-08-08: qty 12.5

## 2022-08-08 MED ORDER — LORAZEPAM 2 MG/ML IJ SOLN
1.0000 mg | Freq: Once | INTRAMUSCULAR | Status: AC
  Administered 2022-08-08: 1 mg via INTRAVENOUS
  Filled 2022-08-08: qty 1

## 2022-08-08 MED ORDER — LACTATED RINGERS IV SOLN: INTRAVENOUS | Status: DC

## 2022-08-08 MED ORDER — POTASSIUM CHLORIDE 10 MEQ/100ML IV SOLN
10.0000 meq | INTRAVENOUS | Status: DC
  Administered 2022-08-08 (×5): 10 meq via INTRAVENOUS
  Filled 2022-08-08 (×5): qty 100

## 2022-08-08 MED ORDER — LACTATED RINGERS IV BOLUS
1000.0000 mL | Freq: Once | INTRAVENOUS | Status: AC
  Administered 2022-08-08: 1000 mL via INTRAVENOUS

## 2022-08-08 MED ORDER — SODIUM CHLORIDE 0.9 % IV SOLN
2.0000 g | Freq: Once | INTRAVENOUS | Status: DC
  Filled 2022-08-08: qty 12.5

## 2022-08-08 MED ORDER — FENTANYL 2500MCG IN NS 250ML (10MCG/ML) PREMIX INFUSION
25.0000 ug/h | INTRAVENOUS | Status: DC
  Administered 2022-08-08: 25 ug/h via INTRAVENOUS
  Administered 2022-08-09: 100 ug/h via INTRAVENOUS
  Filled 2022-08-08 (×3): qty 250

## 2022-08-08 MED ORDER — VANCOMYCIN HCL 1500 MG/300ML IV SOLN
1500.0000 mg | Freq: Once | INTRAVENOUS | Status: DC
  Filled 2022-08-08: qty 300

## 2022-08-08 MED ORDER — ETOMIDATE 2 MG/ML IV SOLN
20.0000 mg | Freq: Once | INTRAVENOUS | Status: AC
  Administered 2022-08-08: 20 mg via INTRAVENOUS

## 2022-08-08 MED ORDER — CHLORHEXIDINE GLUCONATE CLOTH 2 % EX PADS
6.0000 | MEDICATED_PAD | Freq: Every day | CUTANEOUS | Status: DC
  Administered 2022-08-08 – 2022-08-16 (×9): 6 via TOPICAL

## 2022-08-08 MED ORDER — ORAL CARE MOUTH RINSE
15.0000 mL | OROMUCOSAL | Status: DC
  Administered 2022-08-08 – 2022-08-11 (×39): 15 mL via OROMUCOSAL

## 2022-08-08 MED ORDER — INSULIN ASPART 100 UNIT/ML IJ SOLN
0.0000 [IU] | INTRAMUSCULAR | Status: DC
  Administered 2022-08-09 (×2): 1 [IU] via SUBCUTANEOUS
  Filled 2022-08-08 (×2): qty 1

## 2022-08-08 MED ORDER — POTASSIUM CHLORIDE 10 MEQ/100ML IV SOLN
10.0000 meq | INTRAVENOUS | Status: AC
  Administered 2022-08-08: 10 meq via INTRAVENOUS
  Filled 2022-08-08: qty 100

## 2022-08-08 MED ORDER — ORAL CARE MOUTH RINSE: 15.0000 mL | OROMUCOSAL | Status: DC | PRN

## 2022-08-08 MED ORDER — INSULIN REGULAR(HUMAN) IN NACL 100-0.9 UT/100ML-% IV SOLN
INTRAVENOUS | Status: DC
  Administered 2022-08-08: 9.5 [IU]/h via INTRAVENOUS
  Administered 2022-08-08: 17 [IU]/h via INTRAVENOUS
  Filled 2022-08-08: qty 100

## 2022-08-08 MED ORDER — PROPOFOL 1000 MG/100ML IV EMUL
0.0000 ug/kg/min | INTRAVENOUS | Status: DC
  Administered 2022-08-08: 10 ug/kg/min via INTRAVENOUS
  Administered 2022-08-08: 20 ug/kg/min via INTRAVENOUS
  Administered 2022-08-09: 15 ug/kg/min via INTRAVENOUS
  Administered 2022-08-09 – 2022-08-10 (×2): 20 ug/kg/min via INTRAVENOUS
  Filled 2022-08-08 (×6): qty 100

## 2022-08-08 MED ORDER — ASPIRIN 300 MG RE SUPP
300.0000 mg | Freq: Once | RECTAL | Status: AC
  Administered 2022-08-08: 300 mg via RECTAL
  Filled 2022-08-08: qty 1

## 2022-08-08 MED ORDER — HEPARIN (PORCINE) 25000 UT/250ML-% IV SOLN
950.0000 [IU]/h | INTRAVENOUS | Status: DC
  Administered 2022-08-08 – 2022-08-09 (×2): 750 [IU]/h via INTRAVENOUS
  Administered 2022-08-11: 700 [IU]/h via INTRAVENOUS
  Administered 2022-08-12: 850 [IU]/h via INTRAVENOUS
  Filled 2022-08-08 (×3): qty 250

## 2022-08-08 MED ORDER — DEXTROSE IN LACTATED RINGERS 5 % IV SOLN: INTRAVENOUS | Status: DC

## 2022-08-08 MED ORDER — IOHEXOL 350 MG/ML SOLN
75.0000 mL | Freq: Once | INTRAVENOUS | Status: AC | PRN
  Administered 2022-08-08: 75 mL via INTRAVENOUS

## 2022-08-08 MED ORDER — POTASSIUM CHLORIDE 10 MEQ/50ML IV SOLN: 10.0000 meq | INTRAVENOUS | Status: DC

## 2022-08-08 MED ORDER — POTASSIUM CHLORIDE 10 MEQ/100ML IV SOLN
10.0000 meq | INTRAVENOUS | Status: AC
  Administered 2022-08-08 (×2): 10 meq via INTRAVENOUS
  Filled 2022-08-08 (×2): qty 100

## 2022-08-08 NOTE — Consult Note (Signed)
PHARMACY CONSULT NOTE - FOLLOW UP  Pharmacy Consult for Electrolyte Monitoring and Replacement   Recent Labs: Potassium (mmol/L)  Date Value  08/08/2022 3.6   Magnesium (mg/dL)  Date Value  08/08/2022 2.1   Calcium (mg/dL)  Date Value  08/08/2022 8.4 (L)   Albumin (g/dL)  Date Value  08/08/2022 3.9  07/15/2022 3.9   Phosphorus (mg/dL)  Date Value  08/08/2022 8.1 (H)   Sodium (mmol/L)  Date Value  08/08/2022 136  07/15/2022 138     Assessment: Pharmacy has been consulted to monitor and replace electrolytes in 81yo female admitted with acute respiratory failure, AKI, DKA, and lactic acidosis. Patient was transferred to ICU intubated and on 40% FiO2  IVF: LR'@125ml'$ /h  Goal of Therapy:  Electrolytes WNL  Plan:  K 3.6. No repletion needed at this time.  Continue to monitor electrolytes according to IV insulin protocol and daily with AM labs.   Darrick Penna ,PharmD Clinical Pharmacist 08/08/2022 9:17 PM

## 2022-08-08 NOTE — Consult Note (Signed)
Cardiology Consultation   Patient ID: Shona Pardo MRN: [id number]; DOB: December 18, 1940  Admit date: 08/08/2022 Date of Consult: 08/08/2022  PCP:  Cletis [place], [place] Prov[facility] Cardiologist:  Ida Rogue, MD        Patient Profile:   Carrie Kane is a 81 y.o. female with a hx of CAD status post CABG (2018), stroke (01/2022) hypertension, hyperlipidemia, SVT, PAD status post PTA, diabetes, and HFpEF who is being seen 08/08/2022 for the evaluation of NSTEMI at the request of Dr. Gwendalyn Ege.  History of Present Illness:   Ms. Forrey presented to the ED with altered mental status and shortness of breath.  Labs were consistent with DKA with severe metabolic acidosis and hyperglycemia as well as an anion gap.  She was encephalopathic and at high risk of decompensation so she was intubated in the ED.  She was given IV fluids and insulin.  She was also started on broad-spectrum antibiotics with suspicion for underlying sepsis.  Despite IV fluid she was hypotensive and was started on Levophed.  Lactate was greater than 9 and has not improved on subsequent recheck.  Initial blood gas was 7.06/l<18/179.  High-sensitivity troponin was 3038--> 15,838.  She was started on IV heparin.  EKG showed sinus tachycardia with a right bundle branch block and PACs.  She also had LVH.  Cardiology was consulted for further recommendations.  She was first seen 02/2017 for chest pain and underwent stress testing which was abnormal.  Left heart cath revealed severe multivessel disease and she was referred for CABG.  Systolic function was normal at that time.  She has a history of PAD and claudication.  02/2021 she underwent PTA and drug-eluting stent with balloon angioplasty of the right peroneal, popliteal, and distal SFA arteries.  She continues to have claudication and abnormal ABIs.  02/2022 follow-up angiography was recommended but she declined.  01/2022 she presented with right-sided weakness,  aphasia, and slurred speech.  Symptoms resolved after about 24 hours.  CTA showed chronic ischemic changes without any acute intracranial process.  She left AMA and follow-up MRI showed punctate subacute infarct versus artifact in the posterior limb of the internal capsule and chronic lacunar infarcts in the right caudate nucleus, right thalamus, and left pons.  She also had moderate chronic small vessel ischemic changes within the cerebral white matter with subcentimeter chronic infarct in the left cerebellar hemisphere and severe proximal disease in the P2 segment of the right PCA and moderate disease in the A1 segment of the right ACA.  She wore an ambulatory monitor that did not show any evidence of atrial fibrillation but she did have 37 runs of paroxysmal SVT and atrial tachycardia.  She was asymptomatic. She was last seen by our group 04/2022 for preoperative risk assessment prior to hip surgery.  She fell and had a left femoral neck fracture.  She was felt to be stable from a cardiovascular standpoint and was acceptable risk for surgery.   Past Medical History:  Diagnosis Date   CAD (coronary artery disease)    a. 04/2017 Cath: LM 45, LAD 80p, 71m LCX 95ost, 761mEF 45-50%; b. 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   Diastolic dysfunction    a. 04/2017 Echo: EF 55-60%, no rwma, Gr1 DD, mildly dil LA; b. 08/2020 Echo: EF 55-60%, no rwma, GrII DD, nl RV fxn, RVSP 3085m, mod dil LA, mildly dil RA, Mod TR.   Hyperlipidemia    Hypertensive heart disease  Insulin dependent diabetes mellitus    PAD (peripheral artery disease) ([place])    a. 02/2021 PTA/DCBA R Peroneal, R Popliteal, distal R SFA.   PSVT (paroxysmal supraventricular tachycardia)    a. 02/2022 [place]: Predominantly sinus rhythm @ 61 (36-218). 2 NSVT runs (fastest/longest 6 beats @ 218). 37 SVT/A tach runs (fastest 185 x 5 beats, longest 32.4 secs @ 107). Triggered events = RSR, PAC.   Stroke [facility])    a. 01/2022 R sided  wkns/aphasia/tremor/slurred speech-->Ss resolved; b. 02/2022 MRI/A: punctate subacute inf vs artifact-post limb of L int capsule. 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 Mod, chronic small vessel isch changes within the cerebral white matter.  Subcm chronic infarct- L cerebellar hemisphere.  Sev dzs prox P2 seg of R PCA. Mod dzs A1 R Clinical cytogeneticist.    Past Surgical History:  Procedure Laterality Date   ABDOMINAL HYSTERECTOMY     APPLICATION OF WOUND VAC  05/11/2022   Procedure: APPLICATION OF WOUND VAC;  Surgeon: Renee Harder, MD;  Location: [facility];  Service: Orthopedics;;   CARDIAC CATHETERIZATION     CATARACT EXTRACTION W/PHACO Right 10/26/2021   Procedure: CATARACT EXTRACTION PHACO AND INTRAOCULAR LENS PLACEMENT ([facility]) RIGHT DIABETIC 16.27 01:23.8;  Surgeon: Eulogio Bear, MD;  Location: [place];  Service: Ophthalmology;  Laterality: Right;  Please leave arrival at 8:00   CATARACT EXTRACTION W/PHACO Left 11/09/2021   Procedure: CATARACT EXTRACTION PHACO AND INTRAOCULAR LENS PLACEMENT ([place]) LEFT DIABETIC;  Surgeon: Eulogio Bear, MD;  Location: [place];  Service: Ophthalmology;  Laterality: Left;  13.39 01:13.0   CORONARY ARTERY BYPASS GRAFT N/A 05/09/2017   Procedure: CORONARY ARTERY BYPASS GRAFTING (CABG) x 3 using left internal mammary artery and right greater saphenous vein harvested endoscopically;  Surgeon: Ivin Poot, MD;  Location: [place];  Service: Open Heart Surgery;  Laterality: N/A;   EYE SURGERY     HIP ARTHROPLASTY Left 05/11/2022   Procedure: ARTHROPLASTY BIPOLAR HIP (HEMIARTHROPLASTY);  Surgeon: Renee Harder, MD;  Location: [facility];  Service: Orthopedics;  Laterality: Left;   INTRAOPERATIVE TRANSESOPHAGEAL ECHOCARDIOGRAM N/A 05/09/2017   Procedure: INTRAOPERATIVE TRANSESOPHAGEAL ECHOCARDIOGRAM;  Surgeon: Ivin Poot, MD;  Location: [place];  Service: Open Heart Surgery;  Laterality: N/A;   IR  RADIOLOGIST EVAL & MGMT  12/04/2020   LEFT HEART CATH AND CORONARY ANGIOGRAPHY N/A 05/02/2017   Procedure: Left Heart Cath and Coronary Angiography;  Surgeon: Wellington Hampshire, MD;  Location: [place] [facility];  Service: Cardiovascular;  Laterality: N/A;   LOWER EXTREMITY ANGIOGRAPHY Left 02/09/2021   Procedure: LOWER EXTREMITY ANGIOGRAPHY;  Surgeon: Algernon Huxley, MD;  Location: [place] [facility];  Service: Cardiovascular;  Laterality: Left;   LOWER EXTREMITY ANGIOGRAPHY Right 02/16/2021   Procedure: LOWER EXTREMITY ANGIOGRAPHY;  Surgeon: Algernon Huxley, MD;  Location: [place] [facility];  Service: Cardiovascular;  Laterality: Right;     Home Medications:  Prior to Admission medications   Medication Sig Start Date End Date Taking? Authorizing Provider  aspirin EC 81 MG tablet Take 81 mg by mouth 2 (two) times daily. 08/02/17  Yes Gollan, Kathlene November, MD  atorvastatin (LIPITOR) 40 MG tablet Take 1 tablet (40 mg total) by mouth daily. 06/23/21  Yes Minna Merritts, MD  benazepril (LOTENSIN) 40 MG tablet TAKE 1 TABLET DAILY 06/02/22  Yes Masoud, Viann Shove, MD  cholecalciferol (VITAMIN D) 1000 units tablet Take 2,000 Units by mouth daily at 12 noon.    Yes [provider]  cloNIDine (CATAPRES) 0.2 MG tablet TAKE 1 TABLET TWICE A DAY 06/02/22  Yes Gollan, Kathlene November, MD  clopidogrel (PLAVIX) 75 MG tablet Take 1 tablet (75 mg total) by mouth daily. 06/23/21  Yes Minna Merritts, MD  ferrous sulfate 325 (65 FE) MG EC tablet Take 1 tablet (325 mg total) by mouth daily at 2 PM. 12/23/20  Yes Masoud, Viann Shove, MD  HUMALOG KWIKPEN 100 UNIT/ML KwikPen INJECT 38 UNITS UNDER THE SKIN DAILY Patient taking differently: 10-12 Units in the morning and at bedtime. 10-12 units BID prn 03/22/22  Yes Masoud, Viann Shove, MD  hydrochlorothiazide (MICROZIDE) 12.5 MG capsule Take 12.5 mg by mouth daily.   Yes [provider]  insulin glargine (LANTUS) 100 UNIT/ML injection INJECT 45 UNITS UNDER THE SKIN AT  BEDTIME Patient taking differently: 20 Units at bedtime. Pt takes 20 units depending on blood sugar level 02/26/21  Yes Beckie Salts, FNP  levothyroxine (SYNTHROID) 100 MCG tablet TAKE 1 TABLET DAILY BEFORE BREAKFAST 03/22/22  Yes Masoud, Viann Shove, MD  Multiple Vitamin (MULTIVITAMIN) capsule Take 1 capsule by mouth daily at 12 noon.    Yes [provider]  valsartan (DIOVAN) 320 MG tablet TAKE 1 TABLET DAILY 07/27/22  Yes Gollan, Kathlene November, MD  BD INSULIN SYRINGE U/F 30G X 1/2" 0.5 ML MISC  03/29/20   [provider]  Na Sulfate-K Sulfate-Mg Sulf 17.5-3.13-1.6 GM/177ML SOLN Take by mouth as directed. 07/06/22   [provider]  nitroGLYCERIN (NITROSTAT) 0.4 MG SL tablet Place 1 tablet (0.4 mg total) under the tongue every 5 (five) minutes as needed for chest pain. 06/23/21   Minna Merritts, MD  Polyethyl Glycol-Propyl Glycol (SYSTANE OP) Apply 1 drop to eye as needed (dry eyes).    [provider]    Inpatient Medications: Scheduled Meds:  docusate  100 mg Per Tube BID   mouth rinse  15 mL Mouth Rinse Q2H   polyethylene glycol  17 g Per Tube Daily   Continuous Infusions:  ceFEPime (MAXIPIME) IV     dextrose 5% lactated ringers 125 mL/hr at 08/08/22 1203   fentaNYL infusion INTRAVENOUS 100 mcg/hr (08/08/22 1200)   heparin 750 Units/hr (08/08/22 1200)   insulin 2.4 Units/hr (08/08/22 1200)   lactated ringers     lactated ringers Stopped (08/08/22 0628)   metronidazole 500 mg (08/08/22 1220)   norepinephrine (LEVOPHED) Adult infusion 19 mcg/min (08/08/22 1200)   potassium chloride 10 mEq (08/08/22 1212)   propofol (DIPRIVAN) infusion 20 mcg/kg/min (08/08/22 1200)   sodium bicarbonate 150 mEq in sterile water 1,150 mL infusion 125 mL/hr at 08/08/22 1200   PRN Meds: dextrose, docusate sodium, mouth rinse, polyethylene glycol  Allergies:    Allergies  Allergen Reactions   Shellfish Allergy Anaphylaxis    Throat swelling   Flexeril [Cyclobenzaprine]  Hypertension    Social History:   Social History   Socioeconomic History   Marital status: Married    Spouse name: [name]    Number of children: 1   Years of education: Not on file   Highest education level: Not on file  Occupational History   Not on file  Tobacco Use   Smoking status: Never   Smokeless tobacco: Never  Vaping Use   Vaping Use: Never used  Substance and Sexual Activity   Alcohol use: No   Drug use: No   Sexual activity: Not on file  Other Topics Concern   Not on file  Social History Narrative   Lives in [place] with  her husband.  She is responsible for driving and grocery shopping.   Social Determinants of Health   Financial Resource Strain: Low Risk  (10/30/2021)   Overall Financial Resource Strain (CARDIA)    Difficulty of Paying Living Expenses: Not hard at all  Food Insecurity: No Food Insecurity (10/30/2021)   Hunger Vital Sign    Worried About Running Out of Food in the Last Year: Never true    Ran Out of Food in the Last Year: Never true  Transportation Needs: No Transportation Needs (10/30/2021)   PRAPARE - Hydrologist (Medical): No    Lack of Transportation (Non-Medical): No  Physical Activity: Sufficiently Active (10/30/2021)   Exercise Vital Sign    Days of Exercise per Week: 4 days    Minutes of Exercise per Session: 40 min  Stress: No Stress Concern Present (10/30/2021)   [place]    Feeling of Stress : Not at all  Social Connections: [place] (10/30/2021)   Social Connection and Isolation Panel [NHANES]    Frequency of Communication with Friends and Family: More than three times a week    Frequency of Social Gatherings with Friends and Family: More than three times a week    Attends Religious Services: More than 4 times per year    Active Member of [employer] or Organizations: Yes    Attends Music therapist: More than 4  times per year    Marital Status: Married  Human resources officer Violence: Not At Risk (10/30/2021)   Humiliation, Afraid, Rape, and Kick questionnaire    Fear of Current or Ex-Partner: No    Emotionally Abused: No    Physically Abused: No    Sexually Abused: No    Family History:    Family History  Problem Relation Age of Onset   Breast cancer Maternal Aunt      ROS:  Please see the history of present illness.  All other ROS reviewed and negative.     Physical Exam/Data:   Vitals:   08/08/22 1130 08/08/22 1145 08/08/22 1200 08/08/22 1215  BP: (!) 115/45 (!) 121/50 (!) 123/46 (!) 118/50  Pulse: 88 87 93 92  Resp: (!) 22 (!) 22 (!) 22 (!) 22  Temp: 99 F (37.2 C) 99 F (37.2 C) 99 F (37.2 C) 99 F (37.2 C)  TempSrc:      SpO2: 100% 100% 100% 100%  Weight:      Height:        Intake/Output Summary (Last 24 hours) at 08/08/2022 1239 Last data filed at 08/08/2022 1200 Gross per 24 hour  Intake 3799.72 ml  Output 565 ml  Net 3234.72 ml      08/08/2022    2:00 AM 08/05/2022    9:03 AM 07/14/2022   11:27 AM  Last 3 Weights  Weight (lbs) 140 lb 154 lb 145 lb  Weight (kg) 63.504 kg 69.854 kg 65.772 kg     VS:  BP (!) 118/50   Pulse 92   Temp 99 F (37.2 C)   Resp (!) 22   Ht '5\' 8"'$  (1.727 m)   Wt 63.5 kg   SpO2 100%   BMI 21.29 kg/m  , BMI Body mass index is 21.29 kg/m. GENERAL:  Critically ill-appearing.  Intubated and sedated.  No acute distress.  HEENT: Pupils equal round and reactive, fundi not visualized, oral mucosa unremarkable NECK:  No jugular venous distention, waveform within  normal limits, carotid upstroke brisk and symmetric, no bruits, no thyromegaly LUNGS:  Clear to auscultation bilaterally HEART:  RRR.  PMI not displaced or sustained,S1 and S2 within normal limits, no S3, no S4, no clicks, no rubs, no murmurs ABD:  Flat, positive bowel sounds normal in frequency in pitch, no bruits, no rebound, no guarding, no midline pulsatile mass, no  hepatomegaly, no splenomegaly EXT:  No edema, no cyanosis no clubbing SKIN:  No rashes no nodules NEURO:  Moving spontaneously.  Unable to fully assess 2/2 sedation. PSYCH: Unable to fully assess 2/2 sedation.  EKG:  The EKG was personally reviewed and demonstrates:  sinus tachycardia.  Rate115 bpm.  RBBB.  PACs.  LVH.  Telemetry:  Telemetry was personally reviewed and demonstrates:    Relevant CV Studies: Echo- preliminary  Normal LVEF with no wall motion abnormalities.  Grade 1 diastolic dysfunction.  RA pressure 3 mmHg.  Laboratory Data:  High Sensitivity Troponin:   Recent Labs  Lab 08/08/22 0210 08/08/22 0513  TROPONINIHS 3,038* 15,838*     Chemistry Recent Labs  Lab 08/08/22 0210 08/08/22 0339 08/08/22 0513 08/08/22 1047  NA 137 140  --  139  K 3.8 3.5  --  2.7*  CL 93* 96*  --  95*  CO2 <7* 13*  --  21*  GLUCOSE 830* 735*  --  468*  BUN 33* 35*  --  35*  CREATININE 1.73* 1.85*  --  1.78*  CALCIUM 9.8 8.7*  --  8.7*  MG  --   --  2.1  --   GFRNONAA 30* 27*  --  29*  ANIONGAP  --  31*  --  23*    Recent Labs  Lab 08/08/22 0210  PROT 6.6  ALBUMIN 3.9  AST 105*  ALT 38  ALKPHOS 88  BILITOT 1.8*   Lipids No results for input(s): "CHOL", "TRIG", "HDL", "LABVLDL", "LDLCALC", "CHOLHDL" in the last 168 hours.  Hematology Recent Labs  Lab 08/08/22 0210 08/08/22 0339  WBC 19.0* 21.1*  RBC 3.29* 3.04*  HGB 9.8* 8.9*  HCT 34.8* 30.2*  MCV 105.8* 99.3  MCH 29.8 29.3  MCHC 28.2* 29.5*  RDW 14.4 14.5  PLT 214 195   Thyroid  Recent Labs  Lab 08/08/22 0513  TSH 0.272*  FREET4 1.40*    BNP Recent Labs  Lab 08/08/22 0210  BNP 261.2*    DDimer No results for input(s): "DDIMER" in the last 168 hours.   Radiology/Studies:  CT Angio Chest Pulmonary Embolism (PE) W or WO Contrast  Result Date: 08/08/2022 CLINICAL DATA:  Sepsis of unknown source. EXAM: CT ANGIOGRAPHY CHEST CT ABDOMEN AND PELVIS WITH CONTRAST TECHNIQUE: Multidetector CT imaging of  the chest was performed using the standard protocol during bolus administration of intravenous contrast. Multiplanar CT image reconstructions and MIPs were obtained to evaluate the vascular anatomy. Multidetector CT imaging of the abdomen and pelvis was performed using the standard protocol during bolus administration of intravenous contrast. RADIATION DOSE REDUCTION: This exam was performed according to the departmental dose-optimization program which includes automated exposure control, adjustment of the mA and/or kV according to patient size and/or use of iterative reconstruction technique. CONTRAST:  92m OMNIPAQUE IOHEXOL 350 MG/ML SOLN COMPARISON:  Chest CT with contrast 10/04/2004. No prior abdomen pelvis CT. Most recently, 2 portable chest films obtained today, portable chest 05/10/2022 FINDINGS: CTA CHEST FINDINGS Support devices: ETT in place 3.4 cm from the carina. NGT curves to the left in the stomach with the tip  abutting the proximal fundal wall. Left IJ line terminates at about the superior cavoatrial junction. Cardiovascular: Interval CABG with healed sternotomy. Native coronary arteries have become heavily calcified since the previous study in the heart has undergone mild-to-moderate interval enlargement, with pan chamber involvement. There is increased distention of the central pulmonary veins. No pericardial effusion is seen. The mitral ring has become heavily calcified except for medially where there is only minimal calcification. The pulmonary trunk, as before is prominent measuring 3.5 cm indicating arterial hypertension, but no arterial embolus is seen. There are increased calcific plaques in the aorta, proximal subclavian or. There is no aortic aneurysm, stenosis or dissection. Mediastinum/Nodes: There is tortuous but otherwise unremarkable thoracic esophagus. Thoracic trachea clear except for the T2. The main bronchi are clear. There is no intrathoracic or axillary adenopathy or thyroid  mass. Lungs/Pleura: There are trace layering pleural effusions. There is interval increased bilateral lower lobe bronchiectasis and posterior basal bronchiolectasis, but no appreciable impacted bronchi. There is subpleural atelectasis in the posterior extreme lung bases. Minimal interstitial edema is noted in the lung bases and apices. There is a 6 mm ground-glass nodule posteriorly in the right upper lobe lung apex on 3:39. There is a 4 mm subpleural ground-glass nodule laterally in the right middle lobe on 3:98. No confluent pneumonia or other focal lung opacity is seen. 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 No acute or significant osseous findings. There is body wall edema in the chest wall. Review of the MIP images confirms the above findings. CT ABDOMEN and PELVIS FINDINGS Hepatobiliary: The liver is mildly steatotic. The intrahepatic periportal edema. Small amount of perihepatic/pericholecystic ascites. The gallbladder free wall slightly thickened and there are stones in the proximal lumen without biliary dilatation. No liver mass is seen. 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 No acute inflammatory change is seen, no mass enhancement. Spleen: Normal in size with homogeneous enhancement. Adrenals/Urinary Tract: New finding of 1.9 x 1.6 cm left adrenal nodule measuring 58 Hounsfield units on this postcontrast study. Adrenal dedicated MRI recommended when clinically feasible. Right adrenals unremarkable. There is a 2.2 cm homogeneous thin walled cyst in the upper pole of the left kidney of 9 Hounsfield units. No follow-up imaging is recommended. There are few additional too small to characterize hypodensities in the left kidney. Right kidney is unremarkable. There are bilateral linear as well rounded calcifications scattered along both renal collecting systems. Most of this appears to be  renovascular calcification but there may be a few small nonobstructive caliceal stones in both kidneys. There is no ureteral stone or hydronephrosis to the level of the left acetabulum, below which the remainder of the distal ureters and bladder are obscured by extensive spray artifact from a left hip replacement. In the delayed phase, no contrast is seen in either collecting system. Stomach/Bowel: No dilatation or wall thickening including of the appendix. Sigmoid diverticulosis without evidence of diverticulitis. The rectum largely obscured by her left hip replacement. Vascular/Lymphatic: There is heavy aortoiliac calcific plaque without AAA. No adenopathy is seen. Reproductive: Status post hysterectomy. No adnexal masses. Other: There is mild-to-moderate body wall anasarca. There is generalized mesenteric congestion. Minimal ascites abdomen and pelvis. No free air, abscess or hemorrhage. Musculoskeletal: Mild osteopenia and mild degenerative change lumbar spine. Left hip arthroplasty. Review of the MIP images confirms the above findings. IMPRESSION: 1. Chronically dilated pulmonary trunk. No arterial embolus is seen. 2. Cardiomegaly with venous distention, slight interstitial  edema in the lung apices and bases, minimal pleural effusions and body wall anasarca. 3. Mesenteric congestion and minimal ascites in the abdomen and pelvis. 4. Increased bronchiectasis and bronchiolectasis in both lower lobes since 2005 but no active infiltrate or bronchial impactions. 5. 6 mm and 4 mm right lung ground-glass nodules. Recommend follow-up chest CT 3-6 months. If stable, repeat follow-up CT 18-24 months then at 2 year intervals until 5 years of stability is documented. Consider resection if either nodule develops solid components or substantially grows. These guidelines do not apply to cancer patients and immunocompromised patients. 6. CABG with native CAD, aortoiliac atherosclerosis. 7. Pericholecystic fluid and slightly  thickened gallbladder wall with stones. This most likely congestive etiology but please correlate clinically to exclude cholecystitis. No other appreciable source for sepsis is seen. 8. 1.9 x 1.6 cm heterogeneous left adrenal nodule not seen in 2005. Further evaluation recommended. 9. Linear and rounded calcifications along the renal collecting systems, most of which are probably renovascular calcifications. There may be a few small caliceal stones. 10. No collecting system contrast in the delayed phase. This could be on a congestive basis or due to heart failure, but could also be seen with nephrotoxicity. Laboratory and clinical correlation advised. 11. Remaining findings discussed above. Electronically Signed   By: Telford Nab M.D.   On: 08/08/2022 05:31   CT ABDOMEN PELVIS W CONTRAST  Result Date: 08/08/2022 CLINICAL DATA:  Sepsis of unknown source. EXAM: CT ANGIOGRAPHY CHEST CT ABDOMEN AND PELVIS WITH CONTRAST TECHNIQUE: Multidetector CT imaging of the chest was performed using the standard protocol during bolus administration of intravenous contrast. Multiplanar CT image reconstructions and MIPs were obtained to evaluate the vascular anatomy. Multidetector CT imaging of the abdomen and pelvis was performed using the standard protocol during bolus administration of intravenous contrast. RADIATION DOSE REDUCTION: This exam was performed according to the departmental dose-optimization program which includes automated exposure control, adjustment of the mA and/or kV according to patient size and/or use of iterative reconstruction technique. CONTRAST:  81m OMNIPAQUE IOHEXOL 350 MG/ML SOLN COMPARISON:  Chest CT with contrast 10/04/2004. No prior abdomen pelvis CT. Most recently, 2 portable chest films obtained today, portable chest 05/10/2022 FINDINGS: CTA CHEST FINDINGS Support devices: ETT in place 3.4 cm from the carina. NGT curves to the left in the stomach with the tip abutting the proximal fundal  wall. Left IJ line terminates at about the superior cavoatrial junction. Cardiovascular: Interval CABG with healed sternotomy. Native coronary arteries have become heavily calcified since the previous study in the heart has undergone mild-to-moderate interval enlargement, with pan chamber involvement. There is increased distention of the central pulmonary veins. No pericardial effusion is seen. The mitral ring has become heavily calcified except for medially where there is only minimal calcification. The pulmonary trunk, as before is prominent measuring 3.5 cm indicating arterial hypertension, but no arterial embolus is seen. There are increased calcific plaques in the aorta, proximal subclavian or. There is no aortic aneurysm, stenosis or dissection. Mediastinum/Nodes: There is tortuous but otherwise unremarkable thoracic esophagus. Thoracic trachea clear except for the T2. The main bronchi are clear. There is no intrathoracic or axillary adenopathy or thyroid mass. Lungs/Pleura: There are trace layering pleural effusions. There is interval increased bilateral lower lobe bronchiectasis and posterior basal bronchiolectasis, but no appreciable impacted bronchi. There is subpleural atelectasis in the posterior extreme lung bases. Minimal interstitial edema is noted in the lung bases and apices. There is a 6 mm ground-glass nodule posteriorly in the  right upper lobe lung apex on 3:39. There is a 4 mm subpleural ground-glass nodule laterally in the right middle lobe on 3:98. No confluent pneumonia or other focal lung opacity is seen. 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 No acute or significant osseous findings. There is body wall edema in the chest wall. Review of the MIP images confirms the above findings. CT ABDOMEN and PELVIS FINDINGS Hepatobiliary: The liver is mildly steatotic. The intrahepatic periportal edema. Small amount of perihepatic/pericholecystic  ascites. The gallbladder free wall slightly thickened and there are stones in the proximal lumen without biliary dilatation. No liver mass is seen. 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 No acute inflammatory change is seen, no mass enhancement. Spleen: Normal in size with homogeneous enhancement. Adrenals/Urinary Tract: New finding of 1.9 x 1.6 cm left adrenal nodule measuring 58 Hounsfield units on this postcontrast study. Adrenal dedicated MRI recommended when clinically feasible. Right adrenals unremarkable. There is a 2.2 cm homogeneous thin walled cyst in the upper pole of the left kidney of 9 Hounsfield units. No follow-up imaging is recommended. There are few additional too small to characterize hypodensities in the left kidney. Right kidney is unremarkable. There are bilateral linear as well rounded calcifications scattered along both renal collecting systems. Most of this appears to be renovascular calcification but there may be a few small nonobstructive caliceal stones in both kidneys. There is no ureteral stone or hydronephrosis to the level of the left acetabulum, below which the remainder of the distal ureters and bladder are obscured by extensive spray artifact from a left hip replacement. In the delayed phase, no contrast is seen in either collecting system. Stomach/Bowel: No dilatation or wall thickening including of the appendix. Sigmoid diverticulosis without evidence of diverticulitis. The rectum largely obscured by her left hip replacement. Vascular/Lymphatic: There is heavy aortoiliac calcific plaque without AAA. No adenopathy is seen. Reproductive: Status post hysterectomy. No adnexal masses. Other: There is mild-to-moderate body wall anasarca. There is generalized mesenteric congestion. Minimal ascites abdomen and pelvis. No free air, abscess or hemorrhage. Musculoskeletal: Mild osteopenia and mild degenerative change lumbar  spine. Left hip arthroplasty. Review of the MIP images confirms the above findings. IMPRESSION: 1. Chronically dilated pulmonary trunk. No arterial embolus is seen. 2. Cardiomegaly with venous distention, slight interstitial edema in the lung apices and bases, minimal pleural effusions and body wall anasarca. 3. Mesenteric congestion and minimal ascites in the abdomen and pelvis. 4. Increased bronchiectasis and bronchiolectasis in both lower lobes since 2005 but no active infiltrate or bronchial impactions. 5. 6 mm and 4 mm right lung ground-glass nodules. Recommend follow-up chest CT 3-6 months. If stable, repeat follow-up CT 18-24 months then at 2 year intervals until 5 years of stability is documented. Consider resection if either nodule develops solid components or substantially grows. These guidelines do not apply to cancer patients and immunocompromised patients. 6. CABG with native CAD, aortoiliac atherosclerosis. 7. Pericholecystic fluid and slightly thickened gallbladder wall with stones. This most likely congestive etiology but please correlate clinically to exclude cholecystitis. No other appreciable source for sepsis is seen. 8. 1.9 x 1.6 cm heterogeneous left adrenal nodule not seen in 2005. Further evaluation recommended. 9. Linear and rounded calcifications along the renal collecting systems, most of which are probably renovascular calcifications. There may be a few small caliceal stones. 10. No collecting system contrast in the delayed phase. This could be on a congestive basis or due to heart  failure, but could also be seen with nephrotoxicity. Laboratory and clinical correlation advised. 11. Remaining findings discussed above. Electronically Signed   By: Telford Nab M.D.   On: 08/08/2022 05:31   DG Chest Port 1 View  Result Date: 08/08/2022 CLINICAL DATA:  Respiratory failure EXAM: PORTABLE CHEST 1 VIEW COMPARISON:  2:20 a.m. FINDINGS: Endotracheal tube 4.9 cm above the carina. Nasogastric  tube extends into the upper abdomen beyond the margin of the examination. Left subclavian central venous catheter tip noted at the superior cavoatrial junction. Lungs are clear. No pneumothorax or pleural effusion. Coronary artery bypass grafting has been performed. Cardiac size is within normal limits. Pulmonary vascularity is normal. No acute bone abnormality. IMPRESSION: 1. Support lines and tubes in appropriate position. 2. No active disease. Electronically Signed   By: Fidela Salisbury M.D.   On: 08/08/2022 03:51   CT HEAD WO CONTRAST (5MM)  Result Date: 08/08/2022 CLINICAL DATA:  Altered mental status EXAM: CT HEAD WITHOUT CONTRAST TECHNIQUE: Contiguous axial images were obtained from the base of the skull through the vertex without intravenous contrast. RADIATION DOSE REDUCTION: This exam was performed according to the departmental dose-optimization program which includes automated exposure control, adjustment of the mA and/or kV according to patient size and/or use of iterative reconstruction technique. COMPARISON:  05/11/2022 FINDINGS: Brain: Imaging is limited by motion artifact. Mild parenchymal volume loss is commensurate with the patient's age. Moderate periventricular white matter changes are present likely reflecting the sequela of small vessel ischemia, stable since prior examination. No evidence of acute intracranial hemorrhage or infarct. No abnormal mass effect or midline shift. No abnormal intra or extra-axial mass lesion. Ventricular size is normal. Cerebellum is unremarkable. Vascular: No hyperdense vessel or unexpected calcification. Skull: Normal. Negative for fracture or focal lesion. Sinuses/Orbits: No acute finding. Other: Mastoid air cells and middle ear cavities are clear. IMPRESSION: Limited examination. Stable senescent changes. No definite acute intracranial hemorrhage or infarct. Electronically Signed   By: Fidela Salisbury M.D.   On: 08/08/2022 03:11   DG Chest Port 1  View  Result Date: 08/08/2022 CLINICAL DATA:  Questionable sepsis. EXAM: PORTABLE CHEST 1 VIEW COMPARISON:  Portable chest 05/10/2022 FINDINGS: Mild cardiomegaly. Old CABG. No vascular congestion is seen. The mitral ring is heavily calcified. There are scattered aortic calcifications with unremarkable mediastinal configuration. The lungs are clear. No pleural effusion is seen. There is thoracic spondylosis. IMPRESSION: No evidence of acute chest process or interval changes. Stable post CABG chest with cardiomegaly. Electronically Signed   By: Telford Nab M.D.   On: 08/08/2022 02:32     Assessment and Plan:   # Elevated troponin: # CAD s/p CABG:  # Hyperlipidemia: # HTN- home, currently hypotension:  # Shock: Patient presented with encephalopathy in the setting of DKA with metabolic derrangement.  Also being empirically treated for sepsis.  Though troponin is quite elevated, there are no acute ischemic changes on EKG and echo is unremarkable.  It is reasonable to give 48h of IV heparin but no plans for an acute ischemic evaluation at this time.  Will reassess once patient is extubated and able to recount her recent history.  Home antihypertensives on hold in the setting of shock (hypovolemic +/- septic).   # CAD:  # PAD:  Resume aspirin and statin once access to NT/OG is established.    Risk Assessment/Risk Scores:     TIMI Risk Score for Unstable Angina or Non-ST Elevation MI:   The patient's TIMI risk score is 5,  which indicates a 26% risk of all cause mortality, new or recurrent myocardial infarction or need for urgent revascularization in the next 14 days.        Total critical care time: 50 minutes. Critical care time was exclusive of separately billable procedures and treating other patients. Critical care was necessary to treat or prevent imminent or life-threatening deterioration. Critical care was time spent personally by me on the following activities: development of treatment  plan with patient and/or surrogate as well as nursing, discussions with consultants, evaluation of patient's response to treatment, examination of patient, obtaining history from patient or surrogate, ordering and performing treatments and interventions, ordering and review of laboratory studies, ordering and review of radiographic studies, pulse oximetry and re-evaluation of patient's condition.   For questions or updates, please contact [place] Please consult [email or web address] for contact info under    Signed, Skeet Latch, MD  08/08/2022 12:39 PM

## 2022-08-08 NOTE — Consult Note (Signed)
Pharmacy Antibiotic Note  Carrie Kane is a 81 y.o. female admitted on 08/08/2022 with AMS x2 days. PMH significant for CAD s/p CABG, PAD, HTN, HLD, DM. Patient found to be in DKA with sepsis in ED. CXR and UA not concerning for infection. CTAP could not exclude cholecystitis as source for sepsis. She was also found to have elevated hsTn with concern for NSTEMI. Patient currently in ICU requiring ventilator support and pressors. Pharmacy has been consulted for Vancomycin and Cefepime dosing.  Plan: Day 1 of antibiotics Competed Vancomycin 1500 mg IV x1 Given AKI, plan to dose Vancomycin by level Scr 1.85 (baseline 0.8-1.0) Check Vancomycin random level 10/23 with AM labs Initiate Cefepime 2 g IV Q24H Patient is also on Metronidazole 500 mg IV Q8H Continue to monitor renal function and follow culture results   Height: '5\' 8"'$  (172.7 cm) Weight: 63.5 kg (140 lb) IBW/kg (Calculated) : 63.9  Temp (24hrs), Avg:96.8 F (36 C), Min:92.3 F (33.5 C), Max:99 F (37.2 C)  Recent Labs  Lab 08/08/22 0210 08/08/22 0339 08/08/22 1025 08/08/22 1047  WBC 19.0* 21.1*  --   --   CREATININE 1.73* 1.85*  --  1.78*  LATICACIDVEN >9.0*  --  >9.0*  --     Estimated Creatinine Clearance: 25.7 mL/min (A) (by C-G formula based on SCr of 1.78 mg/dL (H)).    Allergies  Allergen Reactions   Shellfish Allergy Anaphylaxis    Throat swelling   Flexeril [Cyclobenzaprine] Hypertension    Antimicrobials this admission: 10/22 Cefepime >>  10/22 Vancomycin >>  10/22 Metronidazole >>   Microbiology results: 10/22 BCx: NG<12H 10/22 UCx: IP  10/22 MRSA PCR: negative  Thank you for allowing pharmacy to be a part of this patient's care.  Gretel Acre, PharmD PGY1 Pharmacy Resident 08/08/2022 11:38 AM

## 2022-08-08 NOTE — Progress Notes (Signed)
[place] Progress Note Patient Name: Liandra Mendia DOB: 01-24-1941 MRN: [id number]   Date of Service  08/08/2022  HPI/Events of Note  CCM note pending. ER note reviewed. 81 year old woman with acute respiratory failure, AKI, DKA, lactic acidosis, hyperglycemia. CT head negative. Given antibiotics for unclear source of sepsis. UA and CXR are clear. Arrived to ICU intubated and on 40% fio2.   eICU Interventions  DKA protocol, fluids, IV insulin, frequent lab checks and adjust fluids/ replete electrolytes as needed Trend lactate Follow cultures, on antibiotics Follow CT chest/ belly that was done Elevated troponin without acute STEMI, on IV heparin, will need echo GI prophylaxis Serial ABG and adjust vent Call E link if needed     Intervention Category Major Interventions: Respiratory failure - evaluation and management;Other: Evaluation Type: New Patient Evaluation  Margaretmary Lombard 08/08/2022, 5:47 AM

## 2022-08-08 NOTE — Consult Note (Signed)
PHARMACY CONSULT NOTE - FOLLOW UP  Pharmacy Consult for Electrolyte Monitoring and Replacement   Recent Labs: Potassium (mmol/L)  Date Value  08/08/2022 3.5   Magnesium (mg/dL)  Date Value  08/08/2022 2.1   Calcium (mg/dL)  Date Value  08/08/2022 8.7 (L)   Albumin (g/dL)  Date Value  08/08/2022 3.9  07/15/2022 3.9   Phosphorus (mg/dL)  Date Value  08/08/2022 8.1 (H)   Sodium (mmol/L)  Date Value  08/08/2022 140  07/15/2022 138     Assessment: Pharmacy has been consulted to monitor and replace electrolytes in 81yo female admitted with acute respiratory failure, AKI, DKA, and lactic acidosis. Patient was transferred to ICU intubated and on 40% FiO2  IVF: LR'@125ml'$ /h  Goal of Therapy:  Electrolytes WNL  Plan:  Phos 8.1. CCMD aware, will consult nephrology if warranted No replacement currently indicated Will recheck electrolytes with AM labs  Pearla Dubonnet ,PharmD Clinical Pharmacist 08/08/2022 9:30 AM

## 2022-08-08 NOTE — ED Notes (Signed)
Report to ranier, rn.

## 2022-08-08 NOTE — Progress Notes (Signed)
ANTICOAGULATION CONSULT NOTE  Pharmacy Consult for heparin infusion Indication: NSTEMI  Allergies  Allergen Reactions   Shellfish Allergy Anaphylaxis    Throat swelling   Flexeril [Cyclobenzaprine] Hypertension    Patient Measurements: Height: '5\' 8"'$  (172.7 cm) Weight: 63.5 kg (140 lb) IBW/kg (Calculated) : 63.9 Heparin Dosing Weight: 63.5 kg  Vital Signs: Temp: 97.9 F (36.6 C) (10/22 1445) Temp Source: Esophageal (10/22 0700) BP: 113/55 (10/22 1445) Pulse Rate: 83 (10/22 1445)  Labs: Recent Labs    08/08/22 0210 08/08/22 0339 08/08/22 0513 08/08/22 1047 08/08/22 1439  HGB 9.8* 8.9*  --   --   --   HCT 34.8* 30.2*  --   --   --   PLT 214 195  --   --   --   APTT 27  --   --   --   --   LABPROT 15.5*  --   --   --   --   INR 1.2  --   --   --   --   HEPARINUNFRC  --   --   --   --  0.62  CREATININE 1.73* 1.85*  --  1.78* 1.79*  TROPONINIHS 3,038*  --  15,838*  --   --      Estimated Creatinine Clearance: 25.5 mL/min (A) (by C-G formula based on SCr of 1.79 mg/dL (H)).   Medical History: Past Medical History:  Diagnosis Date   CAD (coronary artery disease)    a. 04/2017 Cath: LM 58, LAD 80p, 30m LCX 95ost, 749mEF 45-50%; b. 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   Diastolic dysfunction    a. 04/2017 Echo: EF 55-60%, no rwma, Gr1 DD, mildly dil LA; b. 08/2020 Echo: EF 55-60%, no rwma, GrII DD, nl RV fxn, RVSP 3065m, mod dil LA, mildly dil RA, Mod TR.   Hyperlipidemia    Hypertensive heart disease    Insulin dependent diabetes mellitus    [facility]peripheral artery disease) ([facility]  a. 02/2021 PTA/DCBA R Peroneal, R Popliteal, distal R SFA.   PSVT (paroxysmal supraventricular tachycardia)    a. 02/2022 Zio: Predominantly sinus rhythm @ 61 (36-218). 2 NSVT runs (fastest/longest 6 beats @ 218). 37 SVT/A tach runs (fastest 185 x 5 beats, longest 32.4 secs @ 107). Triggered events = RSR, PAC.   [facility]  a. 01/2022 R sided wkns/aphasia/tremor/slurred  speech-->Ss resolved; b. 02/2022 MRI/A: punctate subacute inf vs artifact-post limb of L int capsule. 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 Mod, chronic small vessel isch changes within the cerebral white matter.  Subcm chronic infarct- L cerebellar hemisphere.  Sev dzs prox P2 seg of R PCA. Mod dzs A1 R antClinical cytogeneticist  Assessment: Pt is a 81 39 female presenting to ED w/ AMS, found with elevated Troponin I level, being treated for NSTEMI.  Goal of Therapy:  INR 2-3 Monitor platelets by anticoagulation protocol: Yes   Date/Time  HL Rate 10/22'@1439'$   0.62 750 units/hr, therapeutic x1  Plan:  Continue heparin infusion at 750 units/hr Will check confirmatory HL in 8 hrs CBC daily while on heparin  WalPearla DubonnetharmD Clinical Pharmacist 08/08/2022 3:12 PM

## 2022-08-08 NOTE — ED Provider Notes (Addendum)
[facility] Provider Note    Event Date/Time   First MD Initiated Contact with Patient 08/08/22 0157     (approximate)   History   Altered Mental Status   HPI  Carrie Kane is a 81 y.o. female who presents to the ED for evaluation of Altered Mental Status   I reviewed PCP visit from 9/27.  History of CAD s/p CABG x3 in 8850, diastolic dysfunction, PAD, HTN, HLD, DM.  DAPT with Plavix.  Patient presents to the ED from home via EMS for evaluation of confusion.  Very limited report from EMS and they report poor historians from family at home.  There is a husband at home.  Patient was reportedly been confused for a day or 2.  No other history.  She presents to the ED obviously tachypneic and encephalopathic, history is limited.  Physical Exam   Triage Vital Signs: ED Triage Vitals  Enc Vitals Group     BP      Pulse      Resp      Temp      Temp src      SpO2      Weight      Height      Head Circumference      Peak Flow      Pain Score      Pain Loc      Pain Edu?      Excl. in [place]?     Most recent vital signs: Vitals:   08/08/22 0345 08/08/22 0350  BP: (!) 89/52 (!) 95/53  Pulse:    Resp: (!) 27 (!) 28  Temp:    SpO2:      General: Lethargic, tachypneic, dry, agitated CV:  Good peripheral perfusion.  Tachycardic and regular Resp:  Tachypneic to about 30. Clear lungs Abd:  No distention.  Poorly localizing and mild tenderness.  Soft abdomen.  No clear peritoneal features. MSK:  No deformity noted.  Neuro:  No focal deficits appreciated. Other:     ED Results / Procedures / Treatments   Labs (all labs ordered are listed, but only abnormal results are displayed) Labs Reviewed  LACTIC ACID, PLASMA - Abnormal; Notable for the following components:      Result Value   Lactic Acid, Venous >9.0 (*)    All other components within normal limits  COMPREHENSIVE METABOLIC PANEL - Abnormal; Notable for the following components:    Chloride 93 (*)    CO2 <7 (*)    Glucose, Bld 830 (*)    BUN 33 (*)    Creatinine, Ser 1.73 (*)    AST 105 (*)    Total Bilirubin 1.8 (*)    GFR, Estimated 30 (*)    All other components within normal limits  CBC WITH DIFFERENTIAL/PLATELET - Abnormal; Notable for the following components:   WBC 19.0 (*)    RBC 3.29 (*)    Hemoglobin 9.8 (*)    HCT 34.8 (*)    MCV 105.8 (*)    MCHC 28.2 (*)    Neutro Abs 14.1 (*)    Abs Immature Granulocytes 0.40 (*)    All other components within normal limits  PROTIME-INR - Abnormal; Notable for the following components:   Prothrombin Time 15.5 (*)    All other components within normal limits  URINALYSIS, COMPLETE (UACMP) WITH MICROSCOPIC - Abnormal; Notable for the following components:   Color, Urine STRAW (*)    APPearance CLEAR (*)  Glucose, UA >=500 (*)    Ketones, ur 20 (*)    Bacteria, UA RARE (*)    All other components within normal limits  BETA-HYDROXYBUTYRIC ACID - Abnormal; Notable for the following components:   Beta-Hydroxybutyric Acid >8.00 (*)    All other components within normal limits  BLOOD GAS, VENOUS - Abnormal; Notable for the following components:   pH, Ven 6.97 (*)    pCO2, Ven 23 (*)    pO2, Ven 75 (*)    Bicarbonate 5.3 (*)    Acid-base deficit 25.2 (*)    All other components within normal limits  BRAIN NATRIURETIC PEPTIDE - Abnormal; Notable for the following components:   B Natriuretic Peptide 261.2 (*)    All other components within normal limits  TROPONIN I (HIGH SENSITIVITY) - Abnormal; Notable for the following components:   Troponin I (High Sensitivity) 3,038 (*)    All other components within normal limits  CULTURE, BLOOD (ROUTINE X 2)  CULTURE, BLOOD (ROUTINE X 2)  URINE CULTURE  APTT  PROCALCITONIN  MAGNESIUM  BLOOD GAS, ARTERIAL  BASIC METABOLIC PANEL  BASIC METABOLIC PANEL  BASIC METABOLIC PANEL  BASIC METABOLIC PANEL  BASIC METABOLIC PANEL  BETA-HYDROXYBUTYRIC ACID   BETA-HYDROXYBUTYRIC ACID  BETA-HYDROXYBUTYRIC ACID  CBC WITH DIFFERENTIAL/PLATELET  URINALYSIS, ROUTINE W REFLEX MICROSCOPIC  CBC  BASIC METABOLIC PANEL  BLOOD GAS, ARTERIAL  MAGNESIUM  PHOSPHORUS  CBG MONITORING, ED  TROPONIN I (HIGH SENSITIVITY)    EKG Sinus tachycardia with a rate of 115 bpm.  Normal axis.  Right bundle.  Prolonged QTc at 507 ms.  Nonspecific ST changes without STEMI.  RADIOLOGY CT head interpreted by me without evidence of acute intracranial pathology CXR interpreted by me without evidence of acute cardiopulmonary pathology. CXR interpreted by me with ETT and CVC in good position  Official radiology report(s): DG Chest Port 1 View  Result Date: 08/08/2022 CLINICAL DATA:  Respiratory failure EXAM: PORTABLE CHEST 1 VIEW COMPARISON:  2:20 a.m. FINDINGS: Endotracheal tube 4.9 cm above the carina. Nasogastric tube extends into the upper abdomen beyond the margin of the examination. Left subclavian central venous catheter tip noted at the superior cavoatrial junction. Lungs are clear. No pneumothorax or pleural effusion. Coronary artery bypass grafting has been performed. Cardiac size is within normal limits. Pulmonary vascularity is normal. No acute bone abnormality. IMPRESSION: 1. Support lines and tubes in appropriate position. 2. No active disease. Electronically Signed   By: Fidela Salisbury M.D.   On: 08/08/2022 03:51   CT HEAD WO CONTRAST (5MM)  Result Date: 08/08/2022 CLINICAL DATA:  Altered mental status EXAM: CT HEAD WITHOUT CONTRAST TECHNIQUE: Contiguous axial images were obtained from the base of the skull through the vertex without intravenous contrast. RADIATION DOSE REDUCTION: This exam was performed according to the departmental dose-optimization program which includes automated exposure control, adjustment of the mA and/or kV according to patient size and/or use of iterative reconstruction technique. COMPARISON:  05/11/2022 FINDINGS: Brain: Imaging is  limited by motion artifact. Mild parenchymal volume loss is commensurate with the patient's age. Moderate periventricular white matter changes are present likely reflecting the sequela of small vessel ischemia, stable since prior examination. No evidence of acute intracranial hemorrhage or infarct. No abnormal mass effect or midline shift. No abnormal intra or extra-axial mass lesion. Ventricular size is normal. Cerebellum is unremarkable. Vascular: No hyperdense vessel or unexpected calcification. Skull: Normal. Negative for fracture or focal lesion. Sinuses/Orbits: No acute finding. Other: Mastoid air cells and middle ear cavities  are clear. IMPRESSION: Limited examination. Stable senescent changes. No definite acute intracranial hemorrhage or infarct. Electronically Signed   By: Fidela Salisbury M.D.   On: 08/08/2022 03:11   DG Chest Port 1 View  Result Date: 08/08/2022 CLINICAL DATA:  Questionable sepsis. EXAM: PORTABLE CHEST 1 VIEW COMPARISON:  Portable chest 05/10/2022 FINDINGS: Mild cardiomegaly. Old CABG. No vascular congestion is seen. The mitral ring is heavily calcified. There are scattered aortic calcifications with unremarkable mediastinal configuration. The lungs are clear. No pleural effusion is seen. There is thoracic spondylosis. IMPRESSION: No evidence of acute chest process or interval changes. Stable post CABG chest with cardiomegaly. Electronically Signed   By: Telford Nab M.D.   On: 08/08/2022 02:32    PROCEDURES and INTERVENTIONS:  .Critical Care  Performed by: Vladimir Crofts, MD Authorized by: Vladimir Crofts, MD   Critical care provider statement:    Critical care time (minutes):  45   Critical care time was exclusive of:  Separately billable procedures and treating other patients   Critical care was necessary to treat or prevent imminent or life-threatening deterioration of the following conditions:  Sepsis, dehydration, endocrine crisis and metabolic crisis   Critical care was  time spent personally by me on the following activities:  Development of treatment plan with patient or surrogate, discussions with consultants, evaluation of patient's response to treatment, examination of patient, ordering and review of laboratory studies, ordering and review of radiographic studies, ordering and performing treatments and interventions, pulse oximetry, re-evaluation of patient's condition and review of old charts Procedure Name: Intubation Date/Time: 08/08/2022 3:48 AM  Performed by: Vladimir Crofts, MDPre-anesthesia Checklist: Patient identified, Patient being monitored, Emergency Drugs available, Timeout performed and Suction available Oxygen Delivery Method: Non-rebreather mask Preoxygenation: Pre-oxygenation with 100% oxygen Induction Type: Rapid sequence Ventilation: Mask ventilation without difficulty Laryngoscope Size: Glidescope and 3 Tube size: 7.5 mm Number of attempts: 1 Airway Equipment and Method: Rigid stylet Placement Confirmation: ETT inserted through vocal cords under direct vision, CO2 detector and Breath sounds checked- equal and bilateral Secured at: 23 cm Tube secured with: ETT holder    .Central Line  Date/Time: 08/08/2022 3:48 AM  Performed by: Vladimir Crofts, MD Authorized by: Vladimir Crofts, MD   Consent:    Consent obtained:  Emergent situation Pre-procedure details:    Indication(s): central venous access and insufficient peripheral access     Hand hygiene: Hand hygiene performed prior to insertion     Sterile barrier technique: All elements of maximal sterile technique followed     Skin preparation:  Chlorhexidine   Skin preparation agent: Skin preparation agent completely dried prior to procedure   Procedure details:    Location:  L subclavian   Patient position:  Supine   Procedural supplies:  Triple lumen   Landmarks identified: yes     Ultrasound guidance: no     Number of attempts:  1   Successful placement: yes   Post-procedure  details:    Post-procedure:  Dressing applied and line sutured   Assessment:  No pneumothorax on x-ray, placement verified by x-ray, free fluid flow and blood return through all ports   Procedure completion:  Tolerated well, no immediate complications .1-3 Lead EKG Interpretation  Performed by: Vladimir Crofts, MD Authorized by: Vladimir Crofts, MD     Interpretation: abnormal     ECG rate:  108   ECG rate assessment: tachycardic     Rhythm: sinus tachycardia     Ectopy: none     Conduction: normal  Medications  vancomycin (VANCOREADY) IVPB 1500 mg/300 mL (1,500 mg Intravenous New Bag/Given 08/08/22 0316)  docusate (COLACE) 50 MG/5ML liquid 100 mg (has no administration in time range)  polyethylene glycol (MIRALAX / GLYCOLAX) packet 17 g (has no administration in time range)  fentaNYL 2564mg in NS 2567m(1090mml) infusion-PREMIX (has no administration in time range)  propofol (DIPRIVAN) 1000 MG/100ML infusion (has no administration in time range)  norepinephrine (LEVOPHED) 16 mg in 250m80m.064 mg/mL) premix infusion (has no administration in time range)  insulin regular, human (MYXREDLIN) 100 units/ 100 mL infusion (9.5 Units/hr Intravenous New Bag/Given 08/08/22 0352)  lactated ringers infusion (has no administration in time range)  dextrose 5 % in lactated ringers infusion (has no administration in time range)  dextrose 50 % solution 0-50 mL (has no administration in time range)  potassium chloride 10 mEq in 100 mL IVPB (10 mEq Intravenous New Bag/Given 08/08/22 0347)  lactated ringers bolus 1,000 mL (has no administration in time range)  docusate sodium (COLACE) capsule 100 mg (has no administration in time range)  polyethylene glycol (MIRALAX / GLYCOLAX) packet 17 g (has no administration in time range)  heparin bolus via infusion 3,800 Units (has no administration in time range)  heparin ADULT infusion 100 units/mL (25000 units/250mL20mas no administration in time range)   lactated ringers bolus 1,000 mL (0 mLs Intravenous Stopped 08/08/22 0353)  sodium bicarbonate injection 100 mEq (100 mEq Intravenous Given 08/08/22 0246)  LORazepam (ATIVAN) injection 1 mg (1 mg Intravenous Given 08/08/22 0255)  lactated ringers bolus 1,000 mL (1,000 mLs Intravenous New Bag/Given 08/08/22 0247)  ceFEPIme (MAXIPIME) 2 g in sodium chloride 0.9 % 100 mL IVPB (0 g Intravenous Stopped 08/08/22 0326)  etomidate (AMIDATE) injection 20 mg (20 mg Intravenous Given 08/08/22 0315)  rocuronium bromide 10 mg/mL (PF) syringe (100 mg Intravenous Given 08/08/22 0316)     IMPRESSION / MDM / ASSESSMENT AND PLAN / ED COURSE  I reviewed the triage vital signs and the nursing notes.  Differential diagnosis includes, but is not limited to, sepsis, DKA, ICH, HHS,   {Patient presents with symptoms of an acute illness or injury that is potentially life-threatening.  81yo 52yon presents encephalopathic from home with evidence of DKA, sepsis and NSTEMI requiring ICU admission. Nonfocal exam without signs of trauma.  She clearly dry, tachypneic and encephalopathic concerning for DKA.  Blood work confirms this with venous blood gas with a pH less than 7.  As we are waiting remainder blood work and electrolytes, she is provided 2 A of bicarb, started on fluids and antibiotics.  CXR is clear and urine without clear infectious features.  Uncertain source of possible sepsis or precipitator of DKA.  Her EKG is nonspecific and her troponin is quite elevated, we will provide rectal aspirin and start her on heparin.  Due to her encephalopathy and critical illness, intubate the patient and attempt to match her respiratory rate of about 26 on the vent.  Central line placed due to significant medication requirements and venous access.  Soft blood pressures with maps 65-70, but no persistent shock to necessitate pressors while she is in the ED.  I would her CTA chest and venous abdomen/pelvis which she gets on her way up  to the ICU.  ICU provider agrees to follow-up on these studies.  Clinical Course as of 08/08/22 0416  Sun Aug 08, 2022  0335 I consult with ICU NP, who agrees to see the patient for admission.  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   Vladimir Crofts, MD 08/08/22 [place], [place], MD 08/08/22 [phone]

## 2022-08-08 NOTE — Progress Notes (Signed)
ANTICOAGULATION CONSULT NOTE  Pharmacy Consult for heparin infusion Indication: NSTEMI  Allergies  Allergen Reactions   Shellfish Allergy Anaphylaxis    Throat swelling   Flexeril [Cyclobenzaprine] Hypertension    Patient Measurements: Height: '5\' 8"'$  (172.7 cm) Weight: 63.5 kg (140 lb) IBW/kg (Calculated) : 63.9 Heparin Dosing Weight: 63.5 kg  Vital Signs: Temp: 98.6 F (37 C) (10/22 2200) BP: 114/56 (10/22 2200) Pulse Rate: 87 (10/22 2200)  Labs: Recent Labs    08/08/22 0210 08/08/22 0339 08/08/22 0513 08/08/22 1047 08/08/22 1439 08/08/22 2012 08/08/22 2242  HGB 9.8* 8.9*  --   --   --   --   --   HCT 34.8* 30.2*  --   --   --   --   --   PLT 214 195  --   --   --   --   --   APTT 27  --   --   --   --   --   --   LABPROT 15.5*  --   --   --   --   --   --   INR 1.2  --   --   --   --   --   --   HEPARINUNFRC  --   --   --   --  0.62  --  0.68  CREATININE 1.73* 1.85*  --  1.78* 1.79* 1.68*  --   TROPONINIHS 3,038*  --  15,838*  --   --   --   --      Estimated Creatinine Clearance: 27.2 mL/min (A) (by C-G formula based on SCr of 1.68 mg/dL (H)).   Medical History: Past Medical History:  Diagnosis Date   CAD (coronary artery disease)    a. 04/2017 Cath: LM 61, LAD 80p, 42m LCX 95ost, 765mEF 45-50%; b. 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   Diastolic dysfunction    a. 04/2017 Echo: EF 55-60%, no rwma, Gr1 DD, mildly dil LA; b. 08/2020 Echo: EF 55-60%, no rwma, GrII DD, nl RV fxn, RVSP 3063m, mod dil LA, mildly dil RA, Mod TR.   Hyperlipidemia    Hypertensive heart disease    Insulin dependent diabetes mellitus    PAD (peripheral artery disease) ([facility]  a. 02/2021 PTA/DCBA R Peroneal, R Popliteal, distal R SFA.   PSVT (paroxysmal supraventricular tachycardia)    a. 02/2022 Zio: Predominantly sinus rhythm @ 61 (36-218). 2 NSVT runs (fastest/longest 6 beats @ 218). 37 SVT/A tach runs (fastest 185 x 5 beats, longest 32.4 secs @ 107). Triggered events  = RSR, PAC.   [facility]  a. 01/2022 R sided wkns/aphasia/tremor/slurred speech-->Ss resolved; b. 02/2022 MRI/A: punctate subacute inf vs artifact-post limb of L int capsule. 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 Mod, chronic small vessel isch changes within the cerebral white matter.  Subcm chronic infarct- L cerebellar hemisphere.  Sev dzs prox P2 seg of R PCA. Mod dzs A1 R antClinical cytogeneticist  Assessment: Pt is a [age] female presenting to ED w/ AMS, found with elevated Troponin I level, being treated for NSTEMI.  Goal of Therapy:  INR 2-3 Monitor platelets by anticoagulation protocol: Yes   Date/Time  HL Rate 10/22'@1439'$   0.62 750 units/hr, therapeutic x1 10/22 2242  0.68 Therapeutic x 2  Plan:  Continue heparin infusion at 750 units/hr Will recheck HL w/ AM daily while therapeutic CBC daily while on heparin  NatOvid Curd  S. Claybon Jabs, PharmD, [facility] 08/08/2022 11:56 PM

## 2022-08-08 NOTE — Progress Notes (Signed)
ANTICOAGULATION CONSULT NOTE  Pharmacy Consult for heparin infusion Indication: NSTEMI  Allergies  Allergen Reactions   Shellfish Allergy Anaphylaxis    Throat swelling   Flexeril [Cyclobenzaprine] Hypertension    Patient Measurements: Height: '5\' 8"'$  (172.7 cm) Weight: 63.5 kg (140 lb) IBW/kg (Calculated) : 63.9 Heparin Dosing Weight: 63.5 kg  Vital Signs: Temp: 97.4 F (36.3 C) (10/22 0216) Temp Source: Axillary (10/22 0216) BP: 95/53 (10/22 0350) Pulse Rate: 102 (10/22 0340)  Labs: Recent Labs    08/08/22 0210  HGB 9.8*  HCT 34.8*  PLT 214  APTT 27  LABPROT 15.5*  INR 1.2  CREATININE 1.73*  TROPONINIHS 3,038*    Estimated Creatinine Clearance: 26.4 mL/min (A) (by C-G formula based on SCr of 1.73 mg/dL (H)).   Medical History: Past Medical History:  Diagnosis Date   CAD (coronary artery disease)    a. 04/2017 Cath: LM 59, LAD 80p, 24m LCX 95ost, 767mEF 45-50%; b. 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   Diastolic dysfunction    a. 04/2017 Echo: EF 55-60%, no rwma, Gr1 DD, mildly dil LA; b. 08/2020 Echo: EF 55-60%, no rwma, GrII DD, nl RV fxn, RVSP 3072m, mod dil LA, mildly dil RA, Mod TR.   Hyperlipidemia    Hypertensive heart disease    Insulin dependent diabetes mellitus    PAD (peripheral artery disease) ([place]  a. 02/2021 PTA/DCBA R Peroneal, R Popliteal, distal R SFA.   PSVT (paroxysmal supraventricular tachycardia)    a. 02/2022 Zio: Predominantly sinus rhythm @ 61 (36-218). 2 NSVT runs (fastest/longest 6 beats @ 218). 37 SVT/A tach runs (fastest 185 x 5 beats, longest 32.4 secs @ 107). Triggered events = RSR, PAC.   [facility]  a. 01/2022 R sided wkns/aphasia/tremor/slurred speech-->Ss resolved; b. 02/2022 MRI/A: punctate subacute inf vs artifact-post limb of L int capsule. 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 Mod, chronic small vessel isch changes within the cerebral white matter.  Subcm chronic infarct- L  cerebellar hemisphere.  Sev dzs prox P2 seg of R PCA. Mod dzs A1 R antClinical cytogeneticist  Assessment: Pt is a [age] female presenting to ED w/ AMS, found with elevated Troponin I level, being treated for NSTEMI.  Goal of Therapy:  INR 2-3 Monitor platelets by anticoagulation protocol: Yes   Plan:  Bolus 3800 units x 1 Start heparin infusion at 750 units/hr Will check HL in 8 hr after start of infusion CBC daily while on heparin  NatRenda RollsharmD, MBASouth Plains Endoscopy Center/22/2023 3:57 AM

## 2022-08-08 NOTE — ED Triage Notes (Signed)
Pt arrives via [facility].  C/O d/t confusion/AMS/  Pt Afib w/RVR, Hi read CBG.  HX MI. Pt present w/SHOB.

## 2022-08-08 NOTE — Consult Note (Signed)
PHARMACY CONSULT NOTE - FOLLOW UP  Pharmacy Consult for Electrolyte Monitoring and Replacement   Recent Labs: Potassium (mmol/L)  Date Value  08/08/2022 2.7 (LL)   Magnesium (mg/dL)  Date Value  08/08/2022 2.1   Calcium (mg/dL)  Date Value  08/08/2022 8.9   Albumin (g/dL)  Date Value  08/08/2022 3.9  07/15/2022 3.9   Phosphorus (mg/dL)  Date Value  08/08/2022 8.1 (H)   Sodium (mmol/L)  Date Value  08/08/2022 139  07/15/2022 138     Assessment: Pharmacy has been consulted to monitor and replace electrolytes in 81yo female admitted with acute respiratory failure, AKI, DKA, and lactic acidosis. Patient was transferred to ICU intubated and on 40% FiO2  IVF: LR'@125ml'$ /h  Goal of Therapy:  Electrolytes WNL  Plan:  K 2.7. Patient has completed 4/5 bags of IV Kcl 77mq since then. Instructed RN to give additoinal 240m IV prior to recheck at [place] Continue to monitor electrolytes PRN and daily with AM labs.   AnDarrick PennaPharmD Clinical Pharmacist 08/08/2022 4:26 PM

## 2022-08-08 NOTE — H&P (Addendum)
NAME:  Carrie Kane, MRN:  [id number], DOB:  1941/06/08, LOS: 0 ADMISSION DATE:  08/08/2022, CONSULTATION DATE:  08/08/22 REFERRING MD:  Vladimir Crofts MD  CHIEF COMPLAINT:  Altered Mental Status   HPI  81 y.o female with significant PMH of Left displaced femoral neck fracture, T2DM, CAD with Hx of CABG X 3, PAD s/p revascularization 2022, PSVT, Anemia, Hypothyroidism,hypertension, HLD, CVA (02/2022) who presented to the ED with chief complaints of altered mental status and shortness of breath.   ED Course: Initial vital signs showed HR of 120 beats/minute, BP 134/60 mm Hg, the RR 36 breaths/minute, and the oxygen saturation % on 98 on RA and a temperature of 97.70F (36.3C). Pertinent Labs/Diagnostics Findings: Chemistry:Na+/ K+:137/3.8  Glucose: 830 BUN/Cr.: 33/1.73, AST/ALT:105/38 CBC: WBC: 19.0  Other Lab findings:   PCT:0.99. Lactic acid: >9.0 COVID EUM:PNTIRWE, Troponin: 3,038, BNP: 261.2 Beta hydroxy >8 Venous Blood Gas result:  pO2;75 pCO2 23; pH 6.97;  HCO3 5.3, %O2 Sat 91.7.  Imaging:  CXR> No evidence of acute chest process or interval changes CTH> No definite acute intracranial hemorrhage or infarct.  The laboratory data showed an anion gap, severe metabolic acidosis, and hyperglycemia consistent with the diagnosis of DKA. Due to her encephalopathic state and severe acidosis with high risk for decompensation, patient was intubated for airway protection. The management was initiated with initial intravenous fluid, electrolytes replacement, and intravenous (IV) bicarbonate push followed by intravenous insulin infusion as per DKA protocol.Patient  was also started on broad-spectrum antibiotics Vanco cefepime and Flagyl for  suspected sepsis and IV heparin for probable NSTEMI. Patient remained hypotensive despite IVF boluses therefore was started on Levophed.  (Sepsis reassessment completed). PCCM consulted for admission and further management.  Past Medical History  Left displaced  femoral neck fracture (HCC) T2DM CAD with Hx of CABG 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) (HCC) Anemia Hypothyroidism Essential hypertension HLD 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) Malnutrition of moderate degree  Significant Hospital Events   08/08/22: Admitted to ICU with toxic metabolic encephalopathy in the setting of severe DKA, Sepsis and probable NSTEMI  Consults:  Cardiology  Procedures:  10/22: Left subclavian central line  Significant Diagnostic Tests:  10/22: Chest Xray> 10/22: Noncontrast CT head> 10/22: CTA abdomen and pelvis> 10/22: CTA Chest>  Micro Data:  10/22: SARS-CoV-2 PCR> negative 10/22: Influenza PCR> negative 10/22: Blood culture x2> 10/22: Urine Culture> 10/22: MRSA PCR>>   Antimicrobials:  Vancomycin 10/22> Cefepime 10/22>  OBJECTIVE  Blood pressure 134/60, pulse (!) 122, temperature (!) 97.4 F (36.3 C), temperature source Axillary, resp. rate (!) 30, height '5\' 8"'$  (1.727 m), weight 63.5 kg, SpO2 100 %.    Vent Mode: AC FiO2 (%):  [40 %] 40 % Set Rate:  [26 bmp] 26 bmp Vt Set:  [400 mL] 400 mL PEEP:  [5 cmH20] 5 cmH20   Intake/Output Summary (Last 24 hours) at 08/08/2022 0350 Last data filed at 08/08/2022 3154 Gross per 24 hour  Intake 100 ml  Output --  Net 100 ml   Filed Weights   08/08/22 0200  Weight: 63.5 kg   Physical Examination  GENERAL: 81 year-old critically ill patient lying in the bed INTUBATED AND SEDATED EYES: Pupils equal, round, reactive to light and accommodation. No scleral icterus. Extraocular muscles intact.  HEENT: Head atraumatic, normocephalic. Oropharynx and nasopharynx clear.  NECK:  Supple, MILD jugular venous distention. No thyroid enlargement, no tenderness.  LUNGS: Decreased breath sounds bilaterally, no wheezing, rales,rhonchi or crepitation. No use of accessory  muscles of respiration.  CARDIOVASCULAR: S1, S2 normal. No murmurs, rubs, or gallops.  ABDOMEN: Soft,  nontender, SLIGHTLY distended. Bowel sounds present. No organomegaly or mass.  EXTREMITIES: Upper and lower extremities are atraumatic in appearance without tenderness or deformity. TRACE PEDAL EDEMA No swelling or erythema. Unable to assess ROM or. Muscle strength. Tendon function is normal. Capillary refill is less than 3 seconds in all extremities. Pulses palpable distally NEUROLOGIC:The patient is INTUBATED AND SEDATED. Unable to assess motor function. Sensation is intact bilaterally. Reflexes 2+ bilaterally. Cranial nerves are intact. Cerebellar function is intact. . Gait not checked.  PSYCHIATRIC: The patient is INTUBATED AND SEDATED SKIN:Dry and warm. No obvious rash, lesion, or ulcer.   Labs/imaging that I havepersonally reviewed  (right click and "Reselect all SmartList Selections" daily)     Labs   CBC: Recent Labs  Lab 08/08/22 0210  WBC 19.0*  NEUTROABS 14.1*  HGB 9.8*  HCT 34.8*  MCV 105.8*  PLT 409    Basic Metabolic Panel: Recent Labs  Lab 08/08/22 0210  NA 137  K 3.8  CL 93*  CO2 <7*  GLUCOSE 830*  BUN 33*  CREATININE 1.73*  CALCIUM 9.8   GFR: Estimated Creatinine Clearance: 26.4 mL/min (A) (by C-G formula based on SCr of 1.73 mg/dL (H)). Recent Labs  Lab 08/08/22 0210  WBC 19.0*  LATICACIDVEN >9.0*    Liver Function Tests: Recent Labs  Lab 08/08/22 0210  AST 105*  ALT 38  ALKPHOS 88  BILITOT 1.8*  PROT 6.6  ALBUMIN 3.9   No results for input(s): "LIPASE", "AMYLASE" in the last 168 hours. No results for input(s): "AMMONIA" in the last 168 hours.  ABG    Component Value Date/Time   PHART 7.381 05/09/2017 1941   PCO2ART 41.3 05/09/2017 1943   PO2ART 94.0 05/09/2017 1943   HCO3 5.3 (L) 08/08/2022 0210   TCO2 23 05/10/2017 1736   ACIDBASEDEF 25.2 (H) 08/08/2022 0210   O2SAT 91.7 08/08/2022 0210     Coagulation Profile: Recent Labs  Lab 08/08/22 0210  INR 1.2    Cardiac Enzymes: No results for input(s): "CKTOTAL", "CKMB",  "CKMBINDEX", "TROPONINI" in the last 168 hours.  HbA1C: Hgb A1c MFr Bld  Date/Time Value Ref Range Status  05/10/2022 09:38 PM 8.8 (H) 4.8 - 5.6 % Final    Comment:    (NOTE) Pre diabetes:          5.7%-6.4%  Diabetes:              >6.4%  Glycemic control for   <7.0% adults with diabetes   08/25/2021 10:42 AM 8.9 (H) 4.8 - 5.6 % Final    Comment:             Prediabetes: 5.7 - 6.4          Diabetes: >6.4          Glycemic control for adults with diabetes: <7.0     CBG: Recent Labs  Lab 08/05/22 0902  GLUCAP 146*    Review of Systems:   UNABLE TO OBTAIN  PATIENT IS INTUBATED AND SEDATED  Past Medical History  She,  has a past medical history of CAD (coronary artery disease), Diastolic dysfunction, Hyperlipidemia, Hypertensive heart disease, Insulin dependent diabetes mellitus, PAD (peripheral artery disease) ([place]), PSVT (paroxysmal supraventricular tachycardia), and Stroke ([place]).   Surgical History    Past Surgical History:  Procedure Laterality Date   ABDOMINAL HYSTERECTOMY     APPLICATION OF WOUND VAC  05/11/2022  Procedure: APPLICATION OF WOUND VAC;  Surgeon: Renee Harder, MD;  Location: [facility];  Service: Orthopedics;;   CARDIAC CATHETERIZATION     CATARACT EXTRACTION W/PHACO Right 10/26/2021   Procedure: CATARACT EXTRACTION PHACO AND INTRAOCULAR LENS PLACEMENT ([facility]) RIGHT DIABETIC 16.27 01:23.8;  Surgeon: Eulogio Bear, MD;  Location: [place];  Service: Ophthalmology;  Laterality: Right;  Please leave arrival at 8:00   CATARACT EXTRACTION W/PHACO Left 11/09/2021   Procedure: CATARACT EXTRACTION PHACO AND INTRAOCULAR LENS PLACEMENT ([place]) LEFT DIABETIC;  Surgeon: Eulogio Bear, MD;  Location: [place];  Service: Ophthalmology;  Laterality: Left;  13.39 01:13.0   CORONARY ARTERY BYPASS GRAFT N/A 05/09/2017   Procedure: CORONARY ARTERY BYPASS GRAFTING (CABG) x 3 using left internal mammary artery and right greater saphenous  vein harvested endoscopically;  Surgeon: Ivin Poot, MD;  Location: [place];  Service: Open Heart Surgery;  Laterality: N/A;   EYE SURGERY     HIP ARTHROPLASTY Left 05/11/2022   Procedure: ARTHROPLASTY BIPOLAR HIP (HEMIARTHROPLASTY);  Surgeon: Renee Harder, MD;  Location: [facility];  Service: Orthopedics;  Laterality: Left;   INTRAOPERATIVE TRANSESOPHAGEAL ECHOCARDIOGRAM N/A 05/09/2017   Procedure: INTRAOPERATIVE TRANSESOPHAGEAL ECHOCARDIOGRAM;  Surgeon: Ivin Poot, MD;  Location: [place];  Service: Open Heart Surgery;  Laterality: N/A;   IR RADIOLOGIST EVAL & MGMT  12/04/2020   LEFT HEART CATH AND CORONARY ANGIOGRAPHY N/A 05/02/2017   Procedure: Left Heart Cath and Coronary Angiography;  Surgeon: Wellington Hampshire, MD;  Location: [facility];  Service: Cardiovascular;  Laterality: N/A;   LOWER EXTREMITY ANGIOGRAPHY Left 02/09/2021   Procedure: LOWER EXTREMITY ANGIOGRAPHY;  Surgeon: Algernon Huxley, MD;  Location: [place];  Service: Cardiovascular;  Laterality: Left;   LOWER EXTREMITY ANGIOGRAPHY Right 02/16/2021   Procedure: LOWER EXTREMITY ANGIOGRAPHY;  Surgeon: Algernon Huxley, MD;  Location: [facility];  Service: Cardiovascular;  Laterality: Right;     Social History   reports that she has never smoked. She has never used smokeless tobacco. She reports that she does not drink alcohol and does not use drugs.   Family History   Her family history includes Breast cancer in her maternal aunt.   Allergies Allergies  Allergen Reactions   Shellfish Allergy Anaphylaxis    Throat swelling   Flexeril [Cyclobenzaprine] Hypertension     Home Medications  Prior to Admission medications   Medication Sig Start Date End Date Taking? Authorizing Provider  aspirin EC 81 MG tablet Take 81 mg by mouth 2 (two) times daily. 08/02/17  Yes Gollan, Kathlene November, MD  atorvastatin (LIPITOR) 40 MG tablet Take 1 tablet (40 mg total) by mouth daily. 06/23/21  Yes Minna Merritts, MD  benazepril (LOTENSIN) 40 MG tablet TAKE 1 TABLET DAILY 06/02/22  Yes Masoud, Viann Shove, MD  cholecalciferol (VITAMIN D) 1000 units tablet Take 2,000 Units by mouth daily at 12 noon.    Yes [provider]  cloNIDine (CATAPRES) 0.2 MG tablet TAKE 1 TABLET TWICE A DAY 06/02/22  Yes Gollan, Kathlene November, MD  clopidogrel (PLAVIX) 75 MG tablet Take 1 tablet (75 mg total) by mouth daily. 06/23/21  Yes Minna Merritts, MD  ferrous sulfate 325 (65 FE) MG EC tablet Take 1 tablet (325 mg total) by mouth daily at 2 PM. 12/23/20  Yes Masoud, Viann Shove, MD  HUMALOG KWIKPEN 100 UNIT/ML KwikPen INJECT 38 UNITS UNDER THE SKIN DAILY Patient taking differently: 10-12 Units in the morning and at bedtime. 10-12  units BID prn 03/22/22  Yes Masoud, Viann Shove, MD  hydrochlorothiazide (MICROZIDE) 12.5 MG capsule Take 12.5 mg by mouth daily.   Yes [provider]  insulin glargine (LANTUS) 100 UNIT/ML injection INJECT 45 UNITS UNDER THE SKIN AT BEDTIME Patient taking differently: 20 Units at bedtime. Pt takes 20 units depending on blood sugar level 02/26/21  Yes Beckie Salts, FNP  levothyroxine (SYNTHROID) 100 MCG tablet TAKE 1 TABLET DAILY BEFORE BREAKFAST 03/22/22  Yes Masoud, Viann Shove, MD  Multiple Vitamin (MULTIVITAMIN) capsule Take 1 capsule by mouth daily at 12 noon.    Yes [provider]  valsartan (DIOVAN) 320 MG tablet TAKE 1 TABLET DAILY 07/27/22  Yes Gollan, Kathlene November, MD  BD INSULIN SYRINGE U/F 30G X 1/2" 0.5 ML MISC  03/29/20   [provider]  Na Sulfate-K Sulfate-Mg Sulf 17.5-3.13-1.6 GM/177ML SOLN Take by mouth as directed. 07/06/22   [provider]  nitroGLYCERIN (NITROSTAT) 0.4 MG SL tablet Place 1 tablet (0.4 mg total) under the tongue every 5 (five) minutes as needed for chest pain. 06/23/21   Minna Merritts, MD  Polyethyl Glycol-Propyl Glycol (SYSTANE OP) Apply 1 drop to eye as needed (dry eyes).    [provider]    Scheduled Meds:  aspirin  300 mg  Rectal Once   docusate  100 mg Per Tube BID   heparin  3,800 Units Intravenous Once   polyethylene glycol  17 g Per Tube Daily   Continuous Infusions:  dextrose 5% lactated ringers     fentaNYL infusion INTRAVENOUS     heparin 750 Units/hr (08/08/22 0521)   insulin 9.5 Units/hr (08/08/22 0352)   lactated ringers     lactated ringers 125 mL/hr at 08/08/22 0518   norepinephrine (LEVOPHED) Adult infusion 5 mcg/min (08/08/22 0501)   potassium chloride 10 mEq (08/08/22 0347)   propofol (DIPRIVAN) infusion     sodium bicarbonate 150 mEq in sterile water 1,150 mL infusion 100 mL/hr at 08/08/22 0515   PRN Meds:.dextrose, docusate sodium, polyethylene glycol   Active Hospital Problem list     Assessment & Plan:  Acute Hypoxic Respiratory Failure in the setting of Toxic Metabolic Encephalopathy -Continue ventilator support with lung protective strategies  -Wean PEEP and FiO2 for sats greater than 90%. -Head of bed elevated 30 degrees. -Plateau pressures less than 30 cm H20.  -Follow intermittent chest x-ray and ABG.   -SAT/SBT as tolerated,  -Ensure adequate pulmonary hygiene  -VAP bundle in place  -PAD protocol.wean sedation/analgesia for RASS goal 0 -PRN and scheduled bronchodilators  Diabetic Ketoacidosis  in the setting of SEVERE  Infection of unknown source Severe Anion Gap Metabolic Acidosis with Lactic Acidosis Hx T2DM -Trigger Assessment (CXR, UA negative, Blood  and urine Cultures for infection pending) -Troponin elevated, EKG  shows no ischemia -Will check Lipase for pancreatitis -Received Insulin (regular) 0.1u/kg (~10 units) IV x1. Continue Insulin drip, DKA protocol -Keep NPO -Glucose: q1h to titrate insulin with Lab monitoring: q2-4h BMP+Phosphorus+pH (ABG/VBG)  -volume repletion  with Goal to normalize anion gap  -Diabetes coordinator consult  Severe Sepsis of unknown source Lactic: >9, Baseline PCT: 0.99 Initial interventions/workup included: 3 L of NS/LR &  Cefepime/ Vancomycin meets SIRS criteria:Shock Index (SI) 1.5 CT abd/pelvis pending -Supplemental oxygen as needed, to maintain SpO2 > 90% -F/u cultures, trend lactic/ PCT -Monitor WBC/ fever curve -Continue IV antibiotics with cefepime & vancomycin  -IVF hydration as needed -Pressors for MAP goal >65 -Strict I/O's  NSTEMI PAF? PSVT? PMHx: CAD, NSTEMI,  MI with multiple PCI's CABG X 3, HLD, HTN -Continuous cardiac monitoring -Vasopressors as needed to maintain MAP goal  -Trend Lactic acid  -Trend HS Troponin until peaked  -Atorvastatin '80mg'$  PO daily -start Heparin gtt per ACS -Obtain Echo -Cardiology consult  Acute Toxic Metabolic Encephalopathy  -Provide supportive care -Promote normal sleep/wake cycle -Avoid sedating meds as able -CT Head negative for acute intracranial abnormality   Acute Kidney Injury likely ATN in the setting of above AGMA with Lactic Acidosis -Monitor I&O's / urinary output -Follow BMP -Ensure adequate renal perfusion -Bicarb gtt -Avoid nephrotoxic agents as able -Replace electrolytes as indicated  Hypothyroidism -Check TSH and Free T4  Best practice:  Diet:  NPO Pain/Anxiety/Delirium protocol (if indicated): Yes (RASS goal -1) VAP protocol (if indicated): Yes DVT prophylaxis: Systemic AC GI prophylaxis: PPI Glucose control:  Insulin gtt Central venous access:  Yes, and it is still needed Arterial line:  N/A Foley:  Yes, and it is still needed Mobility:  bed rest  PT consulted: N/A Last date of multidisciplinary goals of care discussion [08/08/22] Code Status:  full code Disposition: ICU   = Goals of Care = Code Status Order: FULL  Primary Emergency Contact: [place], Home Phone: [phone] Wishes to pursue full aggressive treatment and intervention options, including CPR and intubation, but goals of care will be addressed on going with family if that should become necessary.  Critical care time: 45 minutes        [name], DNP, CCRN, FNP-C, AGACNP-BC Acute Care Nurse Practitioner [facility]  PCCM on call pager [phone] until 7 am

## 2022-08-09 ENCOUNTER — Inpatient Hospital Stay: Payer: Medicare Other

## 2022-08-09 DIAGNOSIS — I480 Paroxysmal atrial fibrillation: Secondary | ICD-10-CM

## 2022-08-09 DIAGNOSIS — G9341 Metabolic encephalopathy: Secondary | ICD-10-CM | POA: Diagnosis not present

## 2022-08-09 DIAGNOSIS — A419 Sepsis, unspecified organism: Secondary | ICD-10-CM

## 2022-08-09 DIAGNOSIS — I214 Non-ST elevation (NSTEMI) myocardial infarction: Secondary | ICD-10-CM

## 2022-08-09 DIAGNOSIS — N179 Acute kidney failure, unspecified: Secondary | ICD-10-CM | POA: Diagnosis not present

## 2022-08-09 DIAGNOSIS — R6521 Severe sepsis with septic shock: Secondary | ICD-10-CM

## 2022-08-09 DIAGNOSIS — E1110 Type 2 diabetes mellitus with ketoacidosis without coma: Secondary | ICD-10-CM

## 2022-08-09 LAB — BLOOD GAS, ARTERIAL
Acid-Base Excess: 16.7 mmol/L — ABNORMAL HIGH (ref 0.0–2.0)
Acid-Base Excess: 15.9 mmol/L — ABNORMAL HIGH (ref 0.0–2.0)
Bicarbonate: 38.6 mmol/L — ABNORMAL HIGH (ref 20.0–28.0)
Bicarbonate: 40.9 mmol/L — ABNORMAL HIGH (ref 20.0–28.0)
FIO2: 0.28 %
FIO2: 28 %
MECHVT: 400 mL
MECHVT: 400 mL
Mechanical Rate: 16
O2 Saturation: 96.1 %
O2 Saturation: 100 %
PEEP: 5 cmH2O
PEEP: 5 cmH2O
Patient temperature: 37
Patient temperature: 37
pCO2 arterial: 35 mmHg (ref 32–48)
pCO2 arterial: 49 mmHg — ABNORMAL HIGH (ref 32–48)
pH, Arterial: 7.65 (ref 7.35–7.45)
pH, Arterial: 7.53 — ABNORMAL HIGH (ref 7.35–7.45)
pO2, Arterial: 62 mmHg — ABNORMAL LOW (ref 83–108)
pO2, Arterial: 111 mmHg — ABNORMAL HIGH (ref 83–108)

## 2022-08-09 LAB — CBC
HCT: 28.6 % — ABNORMAL LOW (ref 36.0–46.0)
Hemoglobin: 9.6 g/dL — ABNORMAL LOW (ref 12.0–15.0)
MCH: 30.2 pg (ref 26.0–34.0)
MCHC: 33.6 g/dL (ref 30.0–36.0)
MCV: 89.9 fL (ref 80.0–100.0)
Platelets: 188 10*3/uL (ref 150–400)
RBC: 3.18 MIL/uL — ABNORMAL LOW (ref 3.87–5.11)
RDW: 14 % (ref 11.5–15.5)
WBC: 19.2 10*3/uL — ABNORMAL HIGH (ref 4.0–10.5)
nRBC: 0 % (ref 0.0–0.2)

## 2022-08-09 LAB — BASIC METABOLIC PANEL
Anion gap: 10 (ref 5–15)
BUN: 35 mg/dL — ABNORMAL HIGH (ref 8–23)
CO2: 33 mmol/L — ABNORMAL HIGH (ref 22–32)
Calcium: 8.3 mg/dL — ABNORMAL LOW (ref 8.9–10.3)
Chloride: 97 mmol/L — ABNORMAL LOW (ref 98–111)
Creatinine, Ser: 1.93 mg/dL — ABNORMAL HIGH (ref 0.44–1.00)
GFR, Estimated: 26 mL/min — ABNORMAL LOW (ref >60)
Glucose, Bld: 157 mg/dL — ABNORMAL HIGH (ref 70–99)
Potassium: 3.2 mmol/L — ABNORMAL LOW (ref 3.5–5.1)
Sodium: 140 mmol/L (ref 135–145)

## 2022-08-09 LAB — GLUCOSE, CAPILLARY
Glucose-Capillary: 126 mg/dL — ABNORMAL HIGH (ref 70–99)
Glucose-Capillary: 127 mg/dL — ABNORMAL HIGH (ref 70–99)
Glucose-Capillary: 133 mg/dL — ABNORMAL HIGH (ref 70–99)
Glucose-Capillary: 134 mg/dL — ABNORMAL HIGH (ref 70–99)
Glucose-Capillary: 136 mg/dL — ABNORMAL HIGH (ref 70–99)
Glucose-Capillary: 147 mg/dL — ABNORMAL HIGH (ref 70–99)
Glucose-Capillary: 195 mg/dL — ABNORMAL HIGH (ref 70–99)
Glucose-Capillary: 221 mg/dL — ABNORMAL HIGH (ref 70–99)
Glucose-Capillary: 234 mg/dL — ABNORMAL HIGH (ref 70–99)
Glucose-Capillary: 266 mg/dL — ABNORMAL HIGH (ref 70–99)
Glucose-Capillary: 58 mg/dL — ABNORMAL LOW (ref 70–99)
Glucose-Capillary: 59 mg/dL — ABNORMAL LOW (ref 70–99)

## 2022-08-09 LAB — HEPATIC FUNCTION PANEL
ALT: 92 U/L — ABNORMAL HIGH (ref 0–44)
AST: 267 U/L — ABNORMAL HIGH (ref 15–41)
Albumin: 2.7 g/dL — ABNORMAL LOW (ref 3.5–5.0)
Alkaline Phosphatase: 70 U/L (ref 38–126)
Bilirubin, Direct: 0.1 mg/dL (ref 0.0–0.2)
Indirect Bilirubin: 0.4 mg/dL (ref 0.3–0.9)
Total Bilirubin: 0.5 mg/dL (ref 0.3–1.2)
Total Protein: 5 g/dL — ABNORMAL LOW (ref 6.5–8.1)

## 2022-08-09 LAB — RESPIRATORY PANEL BY PCR

## 2022-08-09 LAB — URINE CULTURE: Culture: NO GROWTH

## 2022-08-09 LAB — TROPONIN I (HIGH SENSITIVITY)
Troponin I (High Sensitivity): >24000 ng/L (ref <18)
Troponin I (High Sensitivity): >24000 ng/L (ref <18)

## 2022-08-09 LAB — VANCOMYCIN, RANDOM: Vancomycin Rm: 12 ug/mL

## 2022-08-09 LAB — HEPARIN LEVEL (UNFRACTIONATED): Heparin Unfractionated: 0.63 IU/mL (ref 0.30–0.70)

## 2022-08-09 LAB — PROCALCITONIN: Procalcitonin: 7.02 ng/mL

## 2022-08-09 LAB — APTT: aPTT: 113 seconds — ABNORMAL HIGH (ref 24–36)

## 2022-08-09 LAB — PROTIME-INR
INR: 1.2 (ref 0.8–1.2)
Prothrombin Time: 15.5 seconds — ABNORMAL HIGH (ref 11.4–15.2)

## 2022-08-09 LAB — LACTIC ACID, PLASMA: Lactic Acid, Venous: 2.8 mmol/L (ref 0.5–1.9)

## 2022-08-09 LAB — GAMMA GT: GGT: 48 U/L (ref 7–50)

## 2022-08-09 LAB — TRIGLYCERIDES: Triglycerides: 64 mg/dL (ref <150)

## 2022-08-09 MED ORDER — PANTOPRAZOLE SODIUM 40 MG IV SOLR
40.0000 mg | Freq: Every day | INTRAVENOUS | Status: DC
  Administered 2022-08-09 – 2022-08-15 (×7): 40 mg via INTRAVENOUS
  Filled 2022-08-09 (×7): qty 10

## 2022-08-09 MED ORDER — POTASSIUM CHLORIDE 20 MEQ PO PACK
40.0000 meq | PACK | Freq: Once | ORAL | Status: AC
  Administered 2022-08-09: 40 meq
  Filled 2022-08-09: qty 2

## 2022-08-09 MED ORDER — FENTANYL CITRATE PF 50 MCG/ML IJ SOSY: 25.0000 ug | PREFILLED_SYRINGE | INTRAMUSCULAR | Status: DC | PRN

## 2022-08-09 MED ORDER — INSULIN ASPART 100 UNIT/ML IJ SOLN
0.0000 [IU] | INTRAMUSCULAR | Status: DC
  Administered 2022-08-09: 3 [IU] via SUBCUTANEOUS
  Filled 2022-08-09: qty 1

## 2022-08-09 MED ORDER — ASPIRIN 81 MG PO CHEW
81.0000 mg | CHEWABLE_TABLET | Freq: Every day | ORAL | Status: DC
  Administered 2022-08-10 – 2022-08-13 (×3): 81 mg
  Filled 2022-08-09 (×3): qty 1

## 2022-08-09 MED ORDER — DEXTROSE 10 % IV SOLN: INTRAVENOUS | Status: DC

## 2022-08-09 MED ORDER — ASPIRIN 325 MG PO TABS
325.0000 mg | ORAL_TABLET | Freq: Once | ORAL | Status: AC
  Administered 2022-08-09: 325 mg
  Filled 2022-08-09: qty 1

## 2022-08-09 MED ORDER — DEXTROSE 50 % IV SOLN
1.0000 | Freq: Once | INTRAVENOUS | Status: DC
  Filled 2022-08-09: qty 50

## 2022-08-09 NOTE — Progress Notes (Signed)
NAME:  Carrie Kane, MRN:  [id number], DOB:  18-May-1941, LOS: 1 ADMISSION DATE:  08/08/2022, CHIEF COMPLAINT:  AMS  History of Present Illness:  81 y.o. female with significant PMH of left displaced femoral neck fracture, T2DM, CAD with Hx of CABG X 3, PAD s/p revascularization 2022, PSVT, Anemia, Hypothyroidism, hypertension, HLD, CVA (02/2022) who presented to the ED with chief complaints of altered mental status and shortness of breath.  On Presentation, she was noted to be hypotensive, have severe metabolic acidosis and hyperglycemia, with a positive beta-hydroxybutarate. She was managed for DKA with closure of her gap and improvement of her blood sugars. Troponin was elevated (and trending upwards, with cardiology consulted) and she was hypotensive requiring pressor support. Pan CT notable for a small PE (no signs of obstructive shock on TTE) and for pericholecystic fluid. She is intubated and ventilated and in the ICU for further management.  Pertinent  Medical History  Left displaced femoral neck fracture (HCC) T2DM CAD with Hx of CABG 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) ([place]) Anemia Hypothyroidism Essential hypertension HLD 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) Malnutrition of moderate degree  Significant Hospital Events: Including procedures, antibiotic start and stop dates in addition to other pertinent events   08/08/22: Admitted to ICU with toxic metabolic encephalopathy in the setting of severe DKA, Sepsis and probable NSTEMI 08/09/22: Surgery consulted for concern of cholecystitis. Gap closed. D/C fluids and bicarb  Interim History / Subjective:  Patient unresponsive, sedated  Objective   Blood pressure (!) 101/58, pulse 84, temperature 98.4 F (36.9 C), resp. rate (!) 21, height '5\' 8"'$  (1.727 m), weight 63.5 kg, SpO2 93 %.    Vent Mode: PRVC FiO2 (%):  [28 %-40 %] 28 % Set Rate:  [22 bmp-26 bmp] 22 bmp Vt Set:  [400 mL] 400  mL PEEP:  [5 cmH20] 5 cmH20 Plateau Pressure:  [15 cmH20] 15 cmH20   Intake/Output Summary (Last 24 hours) at 08/09/2022 0921 Last data filed at 08/09/2022 7829 Gross per 24 hour  Intake 3643.2 ml  Output 685 ml  Net 2958.2 ml   Filed Weights   08/08/22 0200  Weight: 63.5 kg    Examination: Physical Exam Constitutional:      Appearance: She is ill-appearing and toxic-appearing.  HENT:     Head: Normocephalic.     Mouth/Throat:     Comments: ETT in place Eyes:     Comments: Pinpoint pupils, reactive  Cardiovascular:     Rate and Rhythm: Normal rate and regular rhythm.     Heart sounds: Normal heart sounds.  Pulmonary:     Breath sounds: Normal breath sounds.     Comments: Ventilated breath sounds Abdominal:     General: Abdomen is flat.     Palpations: Abdomen is soft.  Musculoskeletal:     Cervical back: Neck supple.  Skin:    General: Skin is warm.  Neurological:     Mental Status: She is disoriented.     Resolved Hospital Problem list     Assessment & Plan:   81 year old female presenting to the hospital with altered mental status and respiratory failure found to have DKA and shock of unclear source.  Neurology: #Toxic Metabolic Encephalopathy  In the setting of DKA, shock, and possible infection. Intubated in the ED for airway protection, no on analgo-sedation with propofol and fentanyl. Currently on minimal vent settings.  -Goal RASS 0 to -1 -Daily sedation holding -PAD protocol  Respiratory: #Intubated for airway protection #  Bronchiectasis  Intubated for airway protection in the setting of DKA, now on minimal vent settings with improvement of her blood gas. Last ABG with a pH of 7.65 and CO2 of 35 (while receiving bicarbonate). CT with bronchiectasis/bronchiolectasis but no signs of acute infection or exacerbation.  -decrease RR to 16 -Continue vent support with lung protective strategies -daily SBT -6 cc/kg is 378 ml, current TV 400  mL -recheck ABG today  Cardiovascular #NSTEMI #CAD (s/p multiple PCI and CABG) #Afib #Shock  Troponin elevated, and continues to rise. Seen by cardiology and assessed as having NSTEMI due to demand. TTE with no acute wall motion abnormalities. Repeat EKG today does not show any change compared to prior, with no acute ST or T wave changes. Paroxysms of Afib confer poor prognosis, cardiology input appreciated.  Shock, while undifferentiated, appears to be due to a combination of sepsis (?cholecystitis, see ID below) and hypovolemia in the setting of her DKA. She has been volume resuscitated and is overall volume up. Kidney function is poor and her urine output has tapered. On minimal pressors titrated to MAP of 65 and above.  -continue telemetry -trend troponin until peaked -Continue heparin gtt, at least 48 hours  -transition to [place] if survives current admission  GU #AKI  AKI in the setting of shock and DKA. Has received LR as well as sodium bicarb, which overall positive volume status. Initial gas with metabolic acidosis, received sodium bicarb and was intubated. Repeat gas shows improvement in her acid base status and the development of metabolic and respiratory alkalosis. Will hold bicarb drip and decrease her RR and recheck her gas. Should her kidney function worsen and urine output continue to be poor, she could be a candidate for RRT.  -consider renal consult -avoid nephrotoxic medications -Monitor kidney function -gentle electrolyte repletion  GI #Pericholecystic fluid  Elevated bilirubin on presentation, CT with pericholecystic fluid. Concern for cholecystitis as a source of infection leading to shock and DKA. Not a surgical candidate and would require an IR drain should [place] be positive.  -surgery consult -RUQ ultrasound -trend LFTs, check GGT -PPI for prophylaxis  Endo #T1DM #DKA  Presented with DKA (AG, betahydroxybutarate) treated with insulin and improved. Now her  gap is closed and sugars are under better control.  -hypoglycemic protocol -d/c glargine -monitor electrolytes and replete  Hem/Onc #Anemia  -monitor H/H -heparin gtt for NSTEMi  ID #Sepsis  Presented with shock, likely hypovolemic and septic. No infectious source aside from possible cholecystitis. Blood and urine cultures pending.  -broad spectrum antibiotics (Cefepime and Metronidazole) -d/c vancomycin  Best Practice (right click and "Reselect all SmartList Selections" daily)   Diet/type: NPO DVT prophylaxis: systemic heparin GI prophylaxis: PPI Lines: Central line Foley:  Yes, and it is still needed Code Status:  full code Last date of multidisciplinary goals of care discussion [08/09/2022]  Labs   CBC: Recent Labs  Lab 08/08/22 0210 08/08/22 0339 08/09/22 0526  WBC 19.0* 21.1* 19.2*  NEUTROABS 14.1* 16.0*  --   HGB 9.8* 8.9* 9.6*  HCT 34.8* 30.2* 28.6*  MCV 105.8* 99.3 89.9  PLT 214 195 967    Basic Metabolic Panel: Recent Labs  Lab 08/08/22 0339 08/08/22 0513 08/08/22 1047 08/08/22 1439 08/08/22 2012 08/09/22 0526  NA 140  --  139 139 136 140  K 3.5  --  2.7* 2.7* 3.6 3.2*  CL 96*  --  95* 96* 97* 97*  CO2 13*  --  21* 27 30 33*  GLUCOSE  735*  --  468* 280* 119* 157*  BUN 35*  --  35* 34* 33* 35*  CREATININE 1.85*  --  1.78* 1.79* 1.68* 1.93*  CALCIUM 8.7*  --  8.7* 8.9 8.4* 8.3*  MG  --  2.1  --   --   --   --   PHOS  --  8.1*  --   --   --   --    GFR: Estimated Creatinine Clearance: 23.7 mL/min (A) (by C-G formula based on SCr of 1.93 mg/dL (H)). Recent Labs  Lab 08/08/22 0210 08/08/22 0339 08/08/22 1025 08/09/22 0526 08/09/22 0745  PROCALCITON 0.99  --   --  7.02  --   WBC 19.0* 21.1*  --  19.2*  --   LATICACIDVEN >9.0*  --  >9.0*  --  2.8*    Liver Function Tests: Recent Labs  Lab 08/08/22 0210 08/09/22 0747  AST 105* 267*  ALT 38 92*  ALKPHOS 88 70  BILITOT 1.8* 0.5  PROT 6.6 5.0*  ALBUMIN 3.9 2.7*   No results for  input(s): "LIPASE", "AMYLASE" in the last 168 hours. No results for input(s): "AMMONIA" in the last 168 hours.  ABG    Component Value Date/Time   PHART 7.65 (HH) 08/09/2022 0745   PCO2ART 35 08/09/2022 0745   PO2ART 62 (L) 08/09/2022 0745   HCO3 38.6 (H) 08/09/2022 0745   TCO2 23 05/10/2017 1736   ACIDBASEDEF 2.4 (H) 08/08/2022 1047   O2SAT 96.1 08/09/2022 0745     Coagulation Profile: Recent Labs  Lab 08/08/22 0210 08/09/22 0745  INR 1.2 1.2    Cardiac Enzymes: No results for input(s): "CKTOTAL", "CKMB", "CKMBINDEX", "TROPONINI" in the last 168 hours.  HbA1C: Hgb A1c MFr Bld  Date/Time Value Ref Range Status  05/10/2022 09:38 PM 8.8 (H) 4.8 - 5.6 % Final    Comment:    (NOTE) Pre diabetes:          5.7%-6.4%  Diabetes:              >6.4%  Glycemic control for   <7.0% adults with diabetes   08/25/2021 10:42 AM 8.9 (H) 4.8 - 5.6 % Final    Comment:             Prediabetes: 5.7 - 6.4          Diabetes: >6.4          Glycemic control for adults with diabetes: <7.0     CBG: Recent Labs  Lab 08/09/22 0011 08/09/22 0141 08/09/22 0245 08/09/22 0430 08/09/22 0743  GLUCAP 134* 127* 136* 126* 147*    Review of Systems:   Unable to obtain  Past Medical History:  She,  has a past medical history of CAD (coronary artery disease), Diastolic dysfunction, Hyperlipidemia, Hypertensive heart disease, Insulin dependent diabetes mellitus, PAD (peripheral artery disease) ([place]), PSVT (paroxysmal supraventricular tachycardia), and Stroke ([place]).   Surgical History:   Past Surgical History:  Procedure Laterality Date   ABDOMINAL HYSTERECTOMY     APPLICATION OF WOUND VAC  05/11/2022   Procedure: APPLICATION OF WOUND VAC;  Surgeon: Renee Harder, MD;  Location: [facility];  Service: Orthopedics;;   CARDIAC CATHETERIZATION     CATARACT EXTRACTION W/PHACO Right 10/26/2021   Procedure: CATARACT EXTRACTION PHACO AND INTRAOCULAR LENS PLACEMENT (IOC) RIGHT DIABETIC 16.27  01:23.8;  Surgeon: Eulogio Bear, MD;  Location: [place];  Service: Ophthalmology;  Laterality: Right;  Please leave arrival at 8:00   CATARACT EXTRACTION  W/PHACO Left 11/09/2021   Procedure: CATARACT EXTRACTION PHACO AND INTRAOCULAR LENS PLACEMENT (IOC) LEFT DIABETIC;  Surgeon: Eulogio Bear, MD;  Location: [place];  Service: Ophthalmology;  Laterality: Left;  13.39 01:13.0   CORONARY ARTERY BYPASS GRAFT N/A 05/09/2017   Procedure: CORONARY ARTERY BYPASS GRAFTING (CABG) x 3 using left internal mammary artery and right greater saphenous vein harvested endoscopically;  Surgeon: Ivin Poot, MD;  Location: [place];  Service: Open Heart Surgery;  Laterality: N/A;   EYE SURGERY     HIP ARTHROPLASTY Left 05/11/2022   Procedure: ARTHROPLASTY BIPOLAR HIP (HEMIARTHROPLASTY);  Surgeon: Renee Harder, MD;  Location: [facility];  Service: Orthopedics;  Laterality: Left;   INTRAOPERATIVE TRANSESOPHAGEAL ECHOCARDIOGRAM N/A 05/09/2017   Procedure: INTRAOPERATIVE TRANSESOPHAGEAL ECHOCARDIOGRAM;  Surgeon: Ivin Poot, MD;  Location: [place];  Service: Open Heart Surgery;  Laterality: N/A;   IR RADIOLOGIST EVAL & MGMT  12/04/2020   LEFT HEART CATH AND CORONARY ANGIOGRAPHY N/A 05/02/2017   Procedure: Left Heart Cath and Coronary Angiography;  Surgeon: Wellington Hampshire, MD;  Location: [place] [facility];  Service: Cardiovascular;  Laterality: N/A;   LOWER EXTREMITY ANGIOGRAPHY Left 02/09/2021   Procedure: LOWER EXTREMITY ANGIOGRAPHY;  Surgeon: Algernon Huxley, MD;  Location: [place] [facility];  Service: Cardiovascular;  Laterality: Left;   LOWER EXTREMITY ANGIOGRAPHY Right 02/16/2021   Procedure: LOWER EXTREMITY ANGIOGRAPHY;  Surgeon: Algernon Huxley, MD;  Location: [place] [facility];  Service: Cardiovascular;  Laterality: Right;     Social History:   reports that she has never smoked. She has never used smokeless tobacco. She reports that she does not drink alcohol and  does not use drugs.   Family History:  Her family history includes Breast cancer in her maternal aunt.   Allergies Allergies  Allergen Reactions   Shellfish Allergy Anaphylaxis    Throat swelling   Flexeril [Cyclobenzaprine] Hypertension     Home Medications  Prior to Admission medications   Medication Sig Start Date End Date Taking? Authorizing Provider  aspirin EC 81 MG tablet Take 81 mg by mouth 2 (two) times daily. 08/02/17  Yes Gollan, Kathlene November, MD  atorvastatin (LIPITOR) 40 MG tablet Take 1 tablet (40 mg total) by mouth daily. 06/23/21  Yes Minna Merritts, MD  benazepril (LOTENSIN) 40 MG tablet TAKE 1 TABLET DAILY 06/02/22  Yes Masoud, Viann Shove, MD  cholecalciferol (VITAMIN D) 1000 units tablet Take 2,000 Units by mouth daily at 12 noon.    Yes [provider]  cloNIDine (CATAPRES) 0.2 MG tablet TAKE 1 TABLET TWICE A DAY 06/02/22  Yes Gollan, Kathlene November, MD  clopidogrel (PLAVIX) 75 MG tablet Take 1 tablet (75 mg total) by mouth daily. 06/23/21  Yes Minna Merritts, MD  ferrous sulfate 325 (65 FE) MG EC tablet Take 1 tablet (325 mg total) by mouth daily at 2 PM. 12/23/20  Yes Masoud, Viann Shove, MD  HUMALOG KWIKPEN 100 UNIT/ML KwikPen INJECT 38 UNITS UNDER THE SKIN DAILY Patient taking differently: 10-12 Units in the morning and at bedtime. 10-12 units BID prn 03/22/22  Yes Masoud, Viann Shove, MD  hydrochlorothiazide (MICROZIDE) 12.5 MG capsule Take 12.5 mg by mouth daily.   Yes [provider]  insulin glargine (LANTUS) 100 UNIT/ML injection INJECT 45 UNITS UNDER THE SKIN AT BEDTIME Patient taking differently: 20 Units at bedtime. Pt takes 20 units depending on blood sugar level 02/26/21  Yes Beckie Salts, FNP  levothyroxine (SYNTHROID) 100 MCG tablet TAKE 1 TABLET DAILY  BEFORE BREAKFAST 03/22/22  Yes Masoud, Viann Shove, MD  Multiple Vitamin (MULTIVITAMIN) capsule Take 1 capsule by mouth daily at 12 noon.    Yes [provider]  valsartan (DIOVAN) 320 MG tablet TAKE 1 TABLET  DAILY 07/27/22  Yes Gollan, Kathlene November, MD  BD INSULIN SYRINGE U/F 30G X 1/2" 0.5 ML MISC  03/29/20   [provider]  Na Sulfate-K Sulfate-Mg Sulf 17.5-3.13-1.6 GM/177ML SOLN Take by mouth as directed. 07/06/22   [provider]  nitroGLYCERIN (NITROSTAT) 0.4 MG SL tablet Place 1 tablet (0.4 mg total) under the tongue every 5 (five) minutes as needed for chest pain. 06/23/21   Minna Merritts, MD  Polyethyl Glycol-Propyl Glycol (SYSTANE OP) Apply 1 drop to eye as needed (dry eyes).    [provider]     Critical care time: 60 minutes     Armando Reichert, MD [facility]

## 2022-08-09 NOTE — Progress Notes (Signed)
Progress Note  Patient Name: Carrie Kane Date of Encounter: 08/09/2022  Primary Cardiologist: [place] intubated and sedated. Paroxysms of Afib noted on telemetry.   Inpatient Medications    Scheduled Meds:  Chlorhexidine Gluconate Cloth  6 each Topical Daily   docusate  100 mg Per Tube BID   insulin aspart  0-9 Units Subcutaneous Q4H   insulin glargine-yfgn  10 Units Subcutaneous Daily   mouth rinse  15 mL Mouth Rinse Q2H   polyethylene glycol  17 g Per Tube Daily   potassium chloride  40 mEq Per Tube Once   Continuous Infusions:  ceFEPime (MAXIPIME) IV 2 g (08/08/22 2207)   fentaNYL infusion INTRAVENOUS 100 mcg/hr (08/09/22 0704)   heparin 750 Units/hr (08/08/22 1900)   lactated ringers     lactated ringers Stopped (08/08/22 0628)   metronidazole 500 mg (08/09/22 0404)   norepinephrine (LEVOPHED) Adult infusion 25 mcg/min (08/09/22 0704)   propofol (DIPRIVAN) infusion 20 mcg/kg/min (08/09/22 0704)   PRN Meds: dextrose, docusate sodium, mouth rinse, polyethylene glycol   Vital Signs    Vitals:   08/09/22 0400 08/09/22 0500 08/09/22 0600 08/09/22 0700  BP: 109/61 (!) 105/58 127/66   Pulse:  85 91 83  Resp: (!) 22 (!) 22 (!) 22 (!) 22  Temp: 98.4 F (36.9 C) 98.4 F (36.9 C) 98.2 F (36.8 C) 98.4 F (36.9 C)  TempSrc:      SpO2:  92% 95% 92%  Weight:      Height:        Intake/Output Summary (Last 24 hours) at 08/09/2022 0734 Last data filed at 08/09/2022 0704 Gross per 24 hour  Intake 3939.92 ml  Output 885 ml  Net 3054.92 ml   Filed Weights   08/08/22 0200  Weight: 63.5 kg    Telemetry    SR with paroxysms of Afib - Personally Reviewed  ECG    No new tracings - Personally Reviewed  Physical Exam   GEN: No acute distress.   Neck: JVD difficult to assess secondary to respiratory support apparatus. Cardiac: RRR, no murmurs, rubs, or gallops.  Respiratory: Vented breath sounds bilaterally.  GI: Soft, non-distended.    MS: Trace bilateral pretibial edema; No deformity. Neuro:  Intubated and sedated.  Psych: Intubated and sedated.  Labs    Chemistry Recent Labs  Lab 08/08/22 0210 08/08/22 0339 08/08/22 1439 08/08/22 2012 08/09/22 0526  NA 137   < > 139 136 140  K 3.8   < > 2.7* 3.6 3.2*  CL 93*   < > 96* 97* 97*  CO2 <7*   < > 27 30 33*  GLUCOSE 830*   < > 280* 119* 157*  BUN 33*   < > 34* 33* 35*  CREATININE 1.73*   < > 1.79* 1.68* 1.93*  CALCIUM 9.8   < > 8.9 8.4* 8.3*  PROT 6.6  --   --   --   --   ALBUMIN 3.9  --   --   --   --   AST 105*  --   --   --   --   ALT 38  --   --   --   --   ALKPHOS 88  --   --   --   --   BILITOT 1.8*  --   --   --   --   GFRNONAA 30*   < > 28* 31* 26*  ANIONGAP  --    < >  16* 9 10   < > = values in this interval not displayed.     Hematology Recent Labs  Lab 08/08/22 0210 08/08/22 0339 08/09/22 0526  WBC 19.0* 21.1* 19.2*  RBC 3.29* 3.04* 3.18*  HGB 9.8* 8.9* 9.6*  HCT 34.8* 30.2* 28.6*  MCV 105.8* 99.3 89.9  MCH 29.8 29.3 30.2  MCHC 28.2* 29.5* 33.6  RDW 14.4 14.5 14.0  PLT 214 195 188    Cardiac EnzymesNo results for input(s): "TROPONINI" in the last 168 hours. No results for input(s): "TROPIPOC" in the last 168 hours.   BNP Recent Labs  Lab 08/08/22 0210  BNP 261.2*     DDimer No results for input(s): "DDIMER" in the last 168 hours.   Radiology    CT Angio Chest Pulmonary Embolism (PE) W or WO Contrast  Result Date: 08/08/2022 IMPRESSION: 1. Chronically dilated pulmonary trunk. No arterial embolus is seen. 2. Cardiomegaly with venous distention, slight interstitial edema in the lung apices and bases, minimal pleural effusions and body wall anasarca. 3. Mesenteric congestion and minimal ascites in the abdomen and pelvis. 4. Increased bronchiectasis and bronchiolectasis in both lower lobes since 2005 but no active infiltrate or bronchial impactions. 5. 6 mm and 4 mm right lung ground-glass nodules. Recommend follow-up chest CT  3-6 months. If stable, repeat follow-up CT 18-24 months then at 2 year intervals until 5 years of stability is documented. Consider resection if either nodule develops solid components or substantially grows. These guidelines do not apply to cancer patients and immunocompromised patients. 6. CABG with native CAD, aortoiliac atherosclerosis. 7. Pericholecystic fluid and slightly thickened gallbladder wall with stones. This most likely congestive etiology but please correlate clinically to exclude cholecystitis. No other appreciable source for sepsis is seen. 8. 1.9 x 1.6 cm heterogeneous left adrenal nodule not seen in 2005. Further evaluation recommended. 9. Linear and rounded calcifications along the renal collecting systems, most of which are probably renovascular calcifications. There may be a few small caliceal stones. 10. No collecting system contrast in the delayed phase. This could be on a congestive basis or due to heart failure, but could also be seen with nephrotoxicity. Laboratory and clinical correlation advised. 11. Remaining findings discussed above. Electronically Signed   By: Telford Nab M.D.   On: 08/08/2022 05:31   CT ABDOMEN PELVIS W CONTRAST  Result Date: 08/08/2022 IMPRESSION: 1. Chronically dilated pulmonary trunk. No arterial embolus is seen. 2. Cardiomegaly with venous distention, slight interstitial edema in the lung apices and bases, minimal pleural effusions and body wall anasarca. 3. Mesenteric congestion and minimal ascites in the abdomen and pelvis. 4. Increased bronchiectasis and bronchiolectasis in both lower lobes since 2005 but no active infiltrate or bronchial impactions. 5. 6 mm and 4 mm right lung ground-glass nodules. Recommend follow-up chest CT 3-6 months. If stable, repeat follow-up CT 18-24 months then at 2 year intervals until 5 years of stability is documented. Consider resection if either nodule develops solid components or substantially grows. These guidelines do  not apply to cancer patients and immunocompromised patients. 6. CABG with native CAD, aortoiliac atherosclerosis. 7. Pericholecystic fluid and slightly thickened gallbladder wall with stones. This most likely congestive etiology but please correlate clinically to exclude cholecystitis. No other appreciable source for sepsis is seen. 8. 1.9 x 1.6 cm heterogeneous left adrenal nodule not seen in 2005. Further evaluation recommended. 9. Linear and rounded calcifications along the renal collecting systems, most of which are probably renovascular calcifications. There may be a few small  caliceal stones. 10. No collecting system contrast in the delayed phase. This could be on a congestive basis or due to heart failure, but could also be seen with nephrotoxicity. Laboratory and clinical correlation advised. 11. Remaining findings discussed above. Electronically Signed   By: Telford Nab M.D.   On: 08/08/2022 05:31   DG Chest Port 1 View  Result Date: 08/08/2022 IMPRESSION: 1. Support lines and tubes in appropriate position. 2. No active disease. Electronically Signed   By: Fidela Salisbury M.D.   On: 08/08/2022 03:51   CT HEAD WO CONTRAST (5MM)  Result Date: 08/08/2022 IMPRESSION: Limited examination. Stable senescent changes. No definite acute intracranial hemorrhage or infarct. Electronically Signed   By: Fidela Salisbury M.D.   On: 08/08/2022 03:11   DG Chest Port 1 View  Result Date: 08/08/2022 IMPRESSION: No evidence of acute chest process or interval changes. Stable post CABG chest with cardiomegaly. Electronically Signed   By: Telford Nab M.D.   On: 08/08/2022 02:32    Cardiac Studies   2D echo 08/08/2022:  1. Left ventricular ejection fraction, by estimation, is 65 to 70%. The  left ventricle has normal function. The left ventricle has no regional  wall motion abnormalities. There is moderate asymmetric left ventricular  hypertrophy of the septal segment.  Left ventricular diastolic  parameters are consistent with Grade I  diastolic dysfunction (impaired relaxation). Elevated left ventricular  end-diastolic pressure.   2. Right ventricular systolic function is normal. The right ventricular  size is normal. There is mildly elevated pulmonary artery systolic  pressure.   3. Left atrial size was mildly dilated.   4. The mitral valve is normal in structure. Trivial mitral valve  regurgitation. No evidence of mitral stenosis.   5. Tricuspid valve regurgitation is mild to moderate.   6. The aortic valve is tricuspid. Aortic valve regurgitation is not  visualized. No aortic stenosis is present.   7. Aortic dilatation noted. There is mild dilatation of the ascending  aorta, measuring 37 mm.   8. The inferior vena cava is dilated in size with <50% respiratory  variability, suggesting right atrial pressure of 15 mmHg.  Patient Profile     81 y.o. female with history of CAD s/p CABG in 2018, HFpEF, CVA in 01/2022, SVT, NSVT, PAD s/p PTA, DM, HTN, and HLD who was admitted with acute hypoxic respiratory failure requiring mechanical ventilation with encephalopathy in the setting of DKA with metabolic derangement, undifferentiated shock and sepsis of unclear etiology and we are seeing for NSTEMI and new onset Afib.   Assessment & Plan    1. CAD s/p CABG with NSTEMI: -Despite significant elevation of troponin, there are no significant ischemic EKG changes noted upon admission -Continue heparin gtt (new onset Afib as well) -In the context of her significant acute illness and comorbid conditions including acute hypoxic respiratory failure requiring mechanical ventilation, AKI, anemia and sepsis of unclear etiology, no plans for emergent cardiac cath at this time -Once she is improved from her acute illness and stabilized, will need cardiac cath  2. New onset Afib: -Noted to have paroxysms of Afib on tele, currently in sinus rhythm -Hyperthyroid on labs this admission -Etiology  multifactorial including acute illness and hyperthyroidism  -Not currently on AV nodal blocking medication in the context of hypotension requiring vasopressor support -CHADS2VASc 9 (CHF, HTN, age x 2, DM, CVA x 2, vascular disease, sex category) -Heparin gtt for now -Transition to [facility] prior to discharge as long as hgb remains  stable  3. Acute hypoxic respiratory failure with encephalopathy in the setting of DKA with metabolic derangement, undifferentiated shock and sepsis of unclear etiology: -Management per CCM  4. HLD/PAD: -Resume ASA and stain when able  5. Anemia: -HGB stable when compared to admission and improved from values obtained several months prior -Anemia of uncertain etiology  -Management per primary service  6. AKI: -Likely ATN in the setting of hypotension requiring vasopressor support -Avoid nephrotoxic agents   7. Hyperthyroidism: -Possibly in the setting of her acute illness -Likely contributing to her Afib -Management per primary       For questions or updates, please contact [place] Please consult [email or web address] for contact info under Cardiology/STEMI.    Signed, Christell Faith, PA-C [place] Pager: [phone] 08/09/2022, 7:34 AM

## 2022-08-09 NOTE — Progress Notes (Signed)
Notified ICU MD regarding patients troponin. EKG orders and obtained. No additional orders at this time. Continue to assess.

## 2022-08-09 NOTE — Consult Note (Signed)
SURGICAL CONSULTATION NOTE   HISTORY OF PRESENT ILLNESS (HPI):  81 y.o. female presented to [facility] ED for evaluation of altered mental status. Patient was evaluated at the ICU.  She was intubated, sedated on mechanical ventilation.  No major histories have been able to be taken since patient arrived with confusion.  As per primary team family members are not good historian either.  Only pertinent history is confusion for the last day or 2.  There was no mention of abdominal pain, nausea or vomiting.  There was some mention of fever.  As per chart review patient was going to have screening colonoscopy on 08/05/2022 but it was rescheduled due to Plavix.  She was found with elevated blood pressure but recommended to take her usual home medications.  Yesterday patient arrived with a fluid with rapid ventricular response.  His labs showed elevated lactic acid of over 9, white blood cell was 19,000, hemoglobin 9.8.  Mildly elevated PT at 15.5 with INR of 1.2.  CMP shows elevated creatinine of 1.73 previously one 0.75 (2 weeks ago).  She was also noted to be hypotensive with severe metabolic acidosis and hyperglycemia with positive beta hydroxybutyrate.  It was consistent with DKA.  She was eventually intubated to protect airway.  She was symptomatic in the sedation.  She is currently on vasopressors.  She had CT scan of the abdomen and pelvis looking for sources of sepsis.  Patient currently admitted with sepsis thoughts of unknown origin.  CT scan of the abdomen and pelvis shows pericholecystic fluid with mild bladder with thickening.  I personally evaluated the images.  No other pathologies were identified to explain sepsis.  CT of the chest also without any pathology to explain sepsis.  Surgery is consulted by Dr. Genia Harold in this context for evaluation and management of suspected cholecystitis.  PAST MEDICAL HISTORY (PMH):  Past Medical History:  Diagnosis Date   CAD (coronary artery disease)    a. 04/2017  Cath: LM 19, LAD 80p, 27m LCX 95ost, 741mEF 45-50%; b. 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   Diastolic dysfunction    a. 04/2017 Echo: EF 55-60%, no rwma, Gr1 DD, mildly dil LA; b. 08/2020 Echo: EF 55-60%, no rwma, GrII DD, nl RV fxn, RVSP 3031m, mod dil LA, mildly dil RA, Mod TR.   Hyperlipidemia    Hypertensive heart disease    Insulin dependent diabetes mellitus    PAD [place]peripheral artery disease) ([facility]  a. 02/2021 PTA/DCBA R Peroneal, R Popliteal, distal R SFA.   PSVT (paroxysmal supraventricular tachycardia)    a. 02/2022 [place]: Predominantly sinus rhythm @ 61 (36-218). 2 NSVT runs (fastest/longest 6 beats @ 218). 37 SVT/A tach runs (fastest 185 x 5 beats, longest 32.4 secs @ 107). Triggered events = RSR, PAC.   [facility]  a. 01/2022 R sided wkns/aphasia/tremor/slurred speech-->Ss resolved; b. 02/2022 MRI/A: punctate subacute inf vs artifact-post limb of L int capsule. 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 Mod, chronic small vessel isch changes within the cerebral white matter.  Subcm chronic infarct- L cerebellar hemisphere.  Sev dzs prox P2 seg of R PCA. Mod dzs A1 R antClinical cytogeneticist   PAST SURGICAL HISTORY ([place] Past Surgical History:  Procedure Laterality Date   ABDOMINAL HYSTERECTOMY     APPLICATION OF WOUND VAC  05/11/2022   Procedure: APPLICATION OF WOUND VAC;  Surgeon: CraRenee HarderD;  Location: [facility] ORS;  Service: Orthopedics;;   CARDIAC CATHETERIZATION  CATARACT EXTRACTION W/PHACO Right 10/26/2021   Procedure: CATARACT EXTRACTION PHACO AND INTRAOCULAR LENS PLACEMENT ([facility]) RIGHT DIABETIC 16.27 01:23.8;  Surgeon: Eulogio Bear, MD;  Location: [place];  Service: Ophthalmology;  Laterality: Right;  Please leave arrival at 8:00   CATARACT EXTRACTION W/PHACO Left 11/09/2021   Procedure: CATARACT EXTRACTION PHACO AND INTRAOCULAR LENS PLACEMENT ([place]) LEFT DIABETIC;  Surgeon: Eulogio Bear, MD;  Location:  [place];  Service: Ophthalmology;  Laterality: Left;  13.39 01:13.0   CORONARY ARTERY BYPASS GRAFT N/A 05/09/2017   Procedure: CORONARY ARTERY BYPASS GRAFTING (CABG) x 3 using left internal mammary artery and right greater saphenous vein harvested endoscopically;  Surgeon: Ivin Poot, MD;  Location: [employer];  Service: Open Heart Surgery;  Laterality: N/A;   EYE SURGERY     HIP ARTHROPLASTY Left 05/11/2022   Procedure: ARTHROPLASTY BIPOLAR HIP (HEMIARTHROPLASTY);  Surgeon: Renee Harder, MD;  Location: [facility] ORS;  Service: Orthopedics;  Laterality: Left;   INTRAOPERATIVE TRANSESOPHAGEAL ECHOCARDIOGRAM N/A 05/09/2017   Procedure: INTRAOPERATIVE TRANSESOPHAGEAL ECHOCARDIOGRAM;  Surgeon: Ivin Poot, MD;  Location: [place];  Service: Open Heart Surgery;  Laterality: N/A;   IR RADIOLOGIST EVAL & MGMT  12/04/2020   LEFT HEART CATH AND CORONARY ANGIOGRAPHY N/A 05/02/2017   Procedure: Left Heart Cath and Coronary Angiography;  Surgeon: Wellington Hampshire, MD;  Location: [place] [facility];  Service: Cardiovascular;  Laterality: N/A;   LOWER EXTREMITY ANGIOGRAPHY Left 02/09/2021   Procedure: LOWER EXTREMITY ANGIOGRAPHY;  Surgeon: Algernon Huxley, MD;  Location: [place] [facility];  Service: Cardiovascular;  Laterality: Left;   LOWER EXTREMITY ANGIOGRAPHY Right 02/16/2021   Procedure: LOWER EXTREMITY ANGIOGRAPHY;  Surgeon: Algernon Huxley, MD;  Location: [place] [facility];  Service: Cardiovascular;  Laterality: Right;     MEDICATIONS:  Prior to Admission medications   Medication Sig Start Date End Date Taking? Authorizing Provider  aspirin EC 81 MG tablet Take 81 mg by mouth 2 (two) times daily. 08/02/17  Yes Gollan, Kathlene November, MD  atorvastatin (LIPITOR) 40 MG tablet Take 1 tablet (40 mg total) by mouth daily. 06/23/21  Yes Minna Merritts, MD  benazepril (LOTENSIN) 40 MG tablet TAKE 1 TABLET DAILY 06/02/22  Yes Masoud, Viann Shove, MD  cholecalciferol (VITAMIN D) 1000 units tablet Take  2,000 Units by mouth daily at 12 noon.    Yes [provider]  cloNIDine (CATAPRES) 0.2 MG tablet TAKE 1 TABLET TWICE A DAY 06/02/22  Yes Gollan, Kathlene November, MD  clopidogrel (PLAVIX) 75 MG tablet Take 1 tablet (75 mg total) by mouth daily. 06/23/21  Yes Minna Merritts, MD  ferrous sulfate 325 (65 FE) MG EC tablet Take 1 tablet (325 mg total) by mouth daily at 2 PM. 12/23/20  Yes Masoud, Viann Shove, MD  HUMALOG KWIKPEN 100 UNIT/ML KwikPen INJECT 38 UNITS UNDER THE SKIN DAILY Patient taking differently: 10-12 Units in the morning and at bedtime. 10-12 units BID prn 03/22/22  Yes Masoud, Viann Shove, MD  hydrochlorothiazide (MICROZIDE) 12.5 MG capsule Take 12.5 mg by mouth daily.   Yes [provider]  insulin glargine (LANTUS) 100 UNIT/ML injection INJECT 45 UNITS UNDER THE SKIN AT BEDTIME Patient taking differently: 20 Units at bedtime. Pt takes 20 units depending on blood sugar level 02/26/21  Yes Beckie Salts, FNP  levothyroxine (SYNTHROID) 100 MCG tablet TAKE 1 TABLET DAILY BEFORE BREAKFAST 03/22/22  Yes Masoud, Viann Shove, MD  Multiple Vitamin (MULTIVITAMIN) capsule Take 1 capsule by mouth daily at 12 noon.  Yes [provider]  valsartan (DIOVAN) 320 MG tablet TAKE 1 TABLET DAILY 07/27/22  Yes Gollan, Kathlene November, MD  BD INSULIN SYRINGE U/F 30G X 1/2" 0.5 ML MISC  03/29/20   [provider]  Na Sulfate-K Sulfate-Mg Sulf 17.5-3.13-1.6 GM/177ML SOLN Take by mouth as directed. 07/06/22   [provider]  nitroGLYCERIN (NITROSTAT) 0.4 MG SL tablet Place 1 tablet (0.4 mg total) under the tongue every 5 (five) minutes as needed for chest pain. 06/23/21   Minna Merritts, MD  Polyethyl Glycol-Propyl Glycol (SYSTANE OP) Apply 1 drop to eye as needed (dry eyes).    [provider]     ALLERGIES:  Allergies  Allergen Reactions   Shellfish Allergy Anaphylaxis    Throat swelling   Flexeril [Cyclobenzaprine] Hypertension     SOCIAL HISTORY:  Social History    Socioeconomic History   Marital status: Married    Spouse name: [name]    Number of children: 1   Years of education: Not on file   Highest education level: Not on file  Occupational History   Not on file  Tobacco Use   Smoking status: Never   Smokeless tobacco: Never  Vaping Use   Vaping Use: Never used  Substance and Sexual Activity   Alcohol use: No   Drug use: No   Sexual activity: Not on file  Other Topics Concern   Not on file  Social History Narrative   Lives in [place] with her husband.  She is responsible for driving and grocery shopping.   Social Determinants of Health   Financial Resource Strain: Low Risk  (10/30/2021)   Overall Financial Resource Strain (CARDIA)    Difficulty of Paying Living Expenses: Not hard at all  Food Insecurity: No Food Insecurity (10/30/2021)   Hunger Vital Sign    Worried About Running Out of Food in the Last Year: Never true    Ran Out of Food in the Last Year: Never true  Transportation Needs: No Transportation Needs (10/30/2021)   PRAPARE - Hydrologist (Medical): No    Lack of Transportation (Non-Medical): No  Physical Activity: Sufficiently Active (10/30/2021)   Exercise Vital Sign    Days of Exercise per Week: 4 days    Minutes of Exercise per Session: 40 min  Stress: No Stress Concern Present (10/30/2021)   [place]    Feeling of Stress : Not at all  Social Connections: [place] (10/30/2021)   Social Connection and Isolation Panel [NHANES]    Frequency of Communication with Friends and Family: More than three times a week    Frequency of Social Gatherings with Friends and Family: More than three times a week    Attends Religious Services: More than 4 times per year    Active Member of [employer] or Organizations: Yes    Attends Music therapist: More than 4 times per year    Marital Status: Married  Arboriculturist Violence: Not At Risk (10/30/2021)   Humiliation, Afraid, Rape, and Kick questionnaire    Fear of Current or Ex-Partner: No    Emotionally Abused: No    Physically Abused: No    Sexually Abused: No      FAMILY HISTORY:  Family History  Problem Relation Age of Onset   Breast cancer Maternal Aunt      REVIEW OF SYSTEMS:  Unable to complete review of system due to patient  mental status.  Patient on sedation.   VITAL SIGNS:  Temp:  [97.5 F (36.4 C)-99 F (37.2 C)] 98.4 F (36.9 C) (10/23 0833) Pulse Rate:  [54-101] 84 (10/23 0730) Resp:  [16-45] 21 (10/23 0833) BP: (80-128)/(45-71) 101/58 (10/23 0833) SpO2:  [86 %-100 %] 93 % (10/23 0745) FiO2 (%):  [28 %-40 %] 28 % (10/23 0745)     Height: '5\' 8"'$  (172.7 cm) Weight: 63.5 kg BMI (Calculated): 21.29   INTAKE/OUTPUT:  This shift: Total I/O In: 364.5 [I.V.:364.5] Out: -   Last 2 shifts: '@IOLAST2SHIFTS'$ @   PHYSICAL EXAM:  Constitutional:  -- Critically ill, sedated on mechanical ventilation Eyes:  -- Pupils equally round and reactive to light  -- No scleral icterus  Ear, nose, and throat:  -- No jugular venous distension  Pulmonary:  -- No crackles  -- Equal breath sounds bilaterally -- Breathing non-labored at rest Cardiovascular:  -- S1, S2 present  -- No pericardial rubs Gastrointestinal:  -- Abdomen soft, nontender, non-distended, no guarding or rebound tenderness.   -- No grimace on deep palpation of the abdomen.  Musculoskeletal and Integumentary:  -- Wounds: None appreciated -- Extremities: B/L UE and LE FROM, hands and feet warm, no edema  Neurologic:  -- Patient responds to painful stimulus   Labs:     Latest Ref Rng & Units 08/09/2022    5:26 AM 08/08/2022    3:39 AM 08/08/2022    2:10 AM  CBC  WBC 4.0 - 10.5 K/uL 19.2  21.1  19.0   Hemoglobin 12.0 - 15.0 g/dL 9.6  8.9  9.8   Hematocrit 36.0 - 46.0 % 28.6  30.2  34.8   Platelets 150 - 400 K/uL 188  195  214       Latest Ref Rng & Units  08/09/2022    7:47 AM 08/09/2022    5:26 AM 08/08/2022    8:12 PM  CMP  Glucose 70 - 99 mg/dL  157  119   BUN 8 - 23 mg/dL  35  33   Creatinine 0.44 - 1.00 mg/dL  1.93  1.68   Sodium 135 - 145 mmol/L  140  136   Potassium 3.5 - 5.1 mmol/L  3.2  3.6   Chloride 98 - 111 mmol/L  97  97   CO2 22 - 32 mmol/L  33  30   Calcium 8.9 - 10.3 mg/dL  8.3  8.4   Total Protein 6.5 - 8.1 g/dL 5.0     Total Bilirubin 0.3 - 1.2 mg/dL 0.5     Alkaline Phos 38 - 126 U/L 70     AST 15 - 41 U/L 267     ALT 0 - 44 U/L 92        Imaging studies:  EXAM: CT ANGIOGRAPHY CHEST   CT ABDOMEN AND PELVIS WITH CONTRAST   TECHNIQUE: Multidetector CT imaging of the chest was performed using the standard protocol during bolus administration of intravenous contrast. Multiplanar CT image reconstructions and MIPs were obtained to evaluate the vascular anatomy. Multidetector CT imaging of the abdomen and pelvis was performed using the standard protocol during bolus administration of intravenous contrast.   RADIATION DOSE REDUCTION: This exam was performed according to the departmental dose-optimization program which includes automated exposure control, adjustment of the mA and/or kV according to patient size and/or use of iterative reconstruction technique.   CONTRAST:  44m OMNIPAQUE IOHEXOL 350 MG/ML SOLN   COMPARISON:  Chest CT with contrast 10/04/2004. No  prior abdomen pelvis CT. Most recently, 2 portable chest films obtained today, portable chest 05/10/2022   FINDINGS: CTA CHEST FINDINGS   Support devices: ETT in place 3.4 cm from the carina. NGT curves to the left in the stomach with the tip abutting the proximal fundal wall. Left IJ line terminates at about the superior cavoatrial junction.   Cardiovascular: Interval CABG with healed sternotomy. Native coronary arteries have become heavily calcified since the previous study in the heart has undergone mild-to-moderate interval enlargement,  with pan chamber involvement. There is increased distention of the central pulmonary veins.   No pericardial effusion is seen. The mitral ring has become heavily calcified except for medially where there is only minimal calcification.   The pulmonary trunk, as before is prominent measuring 3.5 cm indicating arterial hypertension, but no arterial embolus is seen.   There are increased calcific plaques in the aorta, proximal subclavian or. There is no aortic aneurysm, stenosis or dissection.   Mediastinum/Nodes: There is tortuous but otherwise unremarkable thoracic esophagus. Thoracic trachea clear except for the T2. The main bronchi are clear. There is no intrathoracic or axillary adenopathy or thyroid mass.   Lungs/Pleura: There are trace layering pleural effusions. There is interval increased bilateral lower lobe bronchiectasis and posterior basal bronchiolectasis, but no appreciable impacted bronchi.   There is subpleural atelectasis in the posterior extreme lung bases.   Minimal interstitial edema is noted in the lung bases and apices.   There is a 6 mm ground-glass nodule posteriorly in the right upper lobe lung apex on 3:39.   There is a 4 mm subpleural ground-glass nodule laterally in the right middle lobe on 3:98.   No confluent pneumonia or other focal lung opacity is seen.   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 No acute or significant osseous findings. There is body wall edema in the chest wall.   Review of the MIP images confirms the above findings.   CT ABDOMEN and PELVIS FINDINGS   Hepatobiliary: The liver is mildly steatotic. The intrahepatic periportal edema. Small amount of perihepatic/pericholecystic ascites. The gallbladder free wall slightly thickened and there are stones in the proximal lumen without biliary dilatation. No liver mass is seen.   Pancreas: Atrophic with small scattered  calcifications consistent with chronic calcific pancreatitis. No acute inflammatory change is seen, no mass enhancement.   Spleen: Normal in size with homogeneous enhancement.   Adrenals/Urinary Tract: New finding of 1.9 x 1.6 cm left adrenal nodule measuring 58 Hounsfield units on this postcontrast study. Adrenal dedicated MRI recommended when clinically feasible. Right adrenals unremarkable.   There is a 2.2 cm homogeneous thin walled cyst in the upper pole of the left kidney of 9 Hounsfield units. No follow-up imaging is recommended.   There are few additional too small to characterize hypodensities in the left kidney. Right kidney is unremarkable. There are bilateral linear as well rounded calcifications scattered along both renal collecting systems.   Most of this appears to be renovascular calcification but there may be a few small nonobstructive caliceal stones in both kidneys.   There is no ureteral stone or hydronephrosis to the level of the left acetabulum, below which the remainder of the distal ureters and bladder are obscured by extensive spray artifact from a left hip replacement.   In the delayed phase, no contrast is seen in either collecting system.   Stomach/Bowel: No dilatation or wall thickening including of the appendix. Sigmoid diverticulosis without evidence of  diverticulitis. The rectum largely obscured by her left hip replacement.   Vascular/Lymphatic: There is heavy aortoiliac calcific plaque without AAA. No adenopathy is seen.   Reproductive: Status post hysterectomy. No adnexal masses.   Other: There is mild-to-moderate body wall anasarca. There is generalized mesenteric congestion. Minimal ascites abdomen and pelvis. No free air, abscess or hemorrhage.   Musculoskeletal: Mild osteopenia and mild degenerative change lumbar spine. Left hip arthroplasty.   Review of the MIP images confirms the above findings.   IMPRESSION: 1. Chronically  dilated pulmonary trunk. No arterial embolus is seen. 2. Cardiomegaly with venous distention, slight interstitial edema in the lung apices and bases, minimal pleural effusions and body wall anasarca. 3. Mesenteric congestion and minimal ascites in the abdomen and pelvis. 4. Increased bronchiectasis and bronchiolectasis in both lower lobes since 2005 but no active infiltrate or bronchial impactions. 5. 6 mm and 4 mm right lung ground-glass nodules. Recommend follow-up chest CT 3-6 months. If stable, repeat follow-up CT 18-24 months then at 2 year intervals until 5 years of stability is documented. Consider resection if either nodule develops solid components or substantially grows. These guidelines do not apply to cancer patients and immunocompromised patients. 6. CABG with native CAD, aortoiliac atherosclerosis. 7. Pericholecystic fluid and slightly thickened gallbladder wall with stones. This most likely congestive etiology but please correlate clinically to exclude cholecystitis. No other appreciable source for sepsis is seen. 8. 1.9 x 1.6 cm heterogeneous left adrenal nodule not seen in 2005. Further evaluation recommended. 9. Linear and rounded calcifications along the renal collecting systems, most of which are probably renovascular calcifications. There may be a few small caliceal stones. 10. No collecting system contrast in the delayed phase. This could be on a congestive basis or due to heart failure, but could also be seen with nephrotoxicity. Laboratory and clinical correlation advised. 11. Remaining findings discussed above.     Electronically Signed   By: Telford Nab M.D.   On: 08/08/2022 05:31  Assessment/Plan:  81 y.o. female with sepsis of unknown origin, complicated by pertinent comorbidities including diabetic acidosis, sepsis of unknown origin, critically ill, respiratory failure on mechanical ventilation, diabetes, hypertension, coronary artery  disease.  Septic shock of unknown origin -Patient critically ill, sedated on mechanical ventilation -Patient currently on vasopressors -Troponin continue increasing trend -CT scanning of abdomen with mild pericholecystic fluid.  I recommend to order abdominal ultrasound for further evaluation of these gallbladder findings.  If ultrasound is equivocal for cholecystitis patient will need HIDA scan to confirm cholecystitis.  His cholecystitis, pain with ultrasound or HIDA scan patient will benefit of percutaneous cholecystostomy.  Patient extremely high risk for any surgical intervention.  I will continue to follow.  Arnold Long, MD

## 2022-08-09 NOTE — Consult Note (Signed)
[facility] for Electrolyte Monitoring and Replacement   Recent Labs: Potassium (mmol/L)  Date Value  08/09/2022 3.2 (L)   Magnesium (mg/dL)  Date Value  08/08/2022 2.1   Calcium (mg/dL)  Date Value  08/09/2022 8.3 (L)   Albumin (g/dL)  Date Value  08/08/2022 3.9  07/15/2022 3.9   Phosphorus (mg/dL)  Date Value  08/08/2022 8.1 (H)   Sodium (mmol/L)  Date Value  08/09/2022 140  07/15/2022 138     Assessment: Pharmacy has been consulted to monitor and replace electrolytes in 81yo female admitted with acute respiratory failure, AKI, DKA, and lactic acidosis. Patient was transferred to ICU intubated and on 28% FiO2  IVF: dextrose 10 % at 30 mL/hr  Goal of Therapy:  Electrolytes WNL  Plan:  KCl 40 meq per tube x 1 Continue to monitor electrolytes according to IV insulin protocol and daily with AM labs.   Dallie Piles ,PharmD Clinical Pharmacist 08/09/2022 7:08 AM

## 2022-08-09 NOTE — Inpatient Diabetes Management (Signed)
Inpatient Diabetes Program Recommendations  AACE/ADA: New Consensus Statement on Inpatient Glycemic Control   Target Ranges:  Prepandial:   less than 140 mg/dL      Peak postprandial:   less than 180 mg/dL (1-2 hours)      Critically ill patients:  140 - 180 mg/dL    Latest Reference Range & Units 08/09/22 00:11 08/09/22 01:41 08/09/22 02:45 08/09/22 04:30 08/09/22 07:43  Glucose-Capillary 70 - 99 mg/dL 134 (H) 127 (H) 136 (H) 126 (H) 147 (H)    Latest Reference Range & Units 08/08/22 15:49 08/08/22 16:45 08/08/22 17:46 08/08/22 18:51 08/08/22 20:09 08/08/22 20:56 08/08/22 22:02 08/08/22 23:08  Glucose-Capillary 70 - 99 mg/dL 223 (H) 246 (H) 135 (H) 127 (H) 117 (H) 105 (H) 119 (H) 133 (H)    Latest Reference Range & Units 08/08/22 02:10 08/08/22 03:39  Beta-Hydroxybutyric Acid 0.05 - 0.27 mmol/L >8.00 (H)   Glucose 70 - 99 mg/dL 830 (HH) 735 (HH)   Review of Glycemic Control  Diabetes history: DM2 Outpatient Diabetes medications: Lantus 20 units QHS, Humalog 10-12 units BID Current orders for Inpatient glycemic control: Semglee 10 units daily, Novolog 0-9 units Q4H  Inpatient Diabetes Program Recommendations:    Insulin: Agree with current insulin orders. If tube feeding is started, will likely need to add Novolog tube feeding coverage.  NOTE: Patient admitted with acute metabolic encephalopathy, respiratory failure (currently on vent), DKA, sepsis, NSTEMI, new a-fib. Initial glucose 830 mg/dl on 08/08/22 and patient was ordered IV insulin for DKA which has been changed to SQ insulin regimen. Patient received Semglee 10 units at 00:23 today. Agree with current insulin orders. Will follow along and make further recommendations if needed.  Thanks, Barnie Alderman, RN, MSN, [place] Diabetes Coordinator Inpatient Diabetes Program [phone] (Team Pager from 8am to [place])

## 2022-08-09 NOTE — Progress Notes (Signed)
ANTICOAGULATION CONSULT NOTE  Pharmacy Consult for heparin infusion Indication: NSTEMI  Allergies  Allergen Reactions   Shellfish Allergy Anaphylaxis    Throat swelling   Flexeril [Cyclobenzaprine] Hypertension    Patient Measurements: Height: '5\' 8"'$  (172.7 cm) Weight: 63.5 kg (140 lb) IBW/kg (Calculated) : 63.9 Heparin Dosing Weight: 63.5 kg  Vital Signs: Temp: 98.6 F (37 C) (10/22 2200) BP: 114/56 (10/22 2200) Pulse Rate: 87 (10/22 2200)  Labs: Recent Labs    08/08/22 0210 08/08/22 0339 08/08/22 0513 08/08/22 1047 08/08/22 1439 08/08/22 2012 08/08/22 2242 08/09/22 0526  HGB 9.8* 8.9*  --   --   --   --   --   --   HCT 34.8* 30.2*  --   --   --   --   --   --   PLT 214 195  --   --   --   --   --   --   APTT 27  --   --   --   --   --   --   --   LABPROT 15.5*  --   --   --   --   --   --   --   INR 1.2  --   --   --   --   --   --   --   HEPARINUNFRC  --   --   --   --  0.62  --  0.68 0.63  CREATININE 1.73* 1.85*  --  1.78* 1.79* 1.68*  --   --   TROPONINIHS 3,038*  --  15,838*  --   --   --   --   --      Estimated Creatinine Clearance: 27.2 mL/min (A) (by C-G formula based on SCr of 1.68 mg/dL (H)).   Medical History: Past Medical History:  Diagnosis Date   CAD (coronary artery disease)    a. 04/2017 Cath: LM 15, LAD 80p, 70m LCX 95ost, 760mEF 45-50%; b. 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   Diastolic dysfunction    a. 04/2017 Echo: EF 55-60%, no rwma, Gr1 DD, mildly dil LA; b. 08/2020 Echo: EF 55-60%, no rwma, GrII DD, nl RV fxn, RVSP 3044m, mod dil LA, mildly dil RA, Mod TR.   Hyperlipidemia    Hypertensive heart disease    Insulin dependent diabetes mellitus    [place] artery [place]  a. 02/2021 PTA/DCBA R Peroneal, R Popliteal, distal R SFA.   PSVT (paroxysmal supraventricular tachycardia)    a. 02/2022 [place]: Predominantly sinus rhythm @ 61 (36-218). 2 NSVT runs (fastest/longest 6 beats @ 218). 37 SVT/A tach runs  (fastest 185 x 5 beats, longest 32.4 secs @ 107). Triggered events = RSR, PAC.   [facility]  [facility]. 01/2022 R sided wkns/aphasia/tremor/slurred speech-->Ss resolved; b. 02/2022 MRI/A: punctate subacute inf vs artifact-post limb of L int capsule. 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 Mod, chronic small vessel isch changes within the cerebral white matter.  Subcm chronic infarct- L cerebellar hemisphere.  Sev dzs prox P2 seg of R PCA. Mod dzs A1 R antClinical cytogeneticist  Assessment: Pt is a [age] female presenting to ED w/ AMS, found with elevated Troponin I level, being treated for NSTEMI.  Goal of Therapy:  INR 2-3 Monitor platelets by anticoagulation protocol: Yes   Date/Time  HL Rate 10/22'@1439'$   0.62 750 units/hr, therapeutic x1 10/22 2242  0.68  Therapeutic x 2 10/23 0526  0.63 Therapeutic x 3  Plan:  Continue heparin infusion at 750 units/hr Will recheck HL w/ AM daily while therapeutic CBC daily while on heparin  Renda Rolls, PharmD, [facility] 08/09/2022 6:24 AM

## 2022-08-09 NOTE — Progress Notes (Signed)
Per ICU MD, will order repeat troponin for tomorrow am. It was greater then 24,000. Patients was hypoglycemic this am, started D 10 and orders to stop blood sugar this afternoon BG 221. Minimal urine output. MD made aware. Family updated at bedside. Continue to assess.

## 2022-08-09 NOTE — Progress Notes (Signed)
Initial Nutrition Assessment  DOCUMENTATION CODES:   Non-severe (moderate) malnutrition in context of chronic illness  INTERVENTION:   -If unable to extubate within 48 hours, initiate nutrition support when medically appropriate:   Initiate Vital AF 1.2 @ 20 ml/hr via OGT and increase by 10 ml every 4 hours to goal rate of 50 ml/hr.   30 ml free water flush every 4 hours to maintain tube patency  Tube feeding regimen provides 1440 kcal (100% of needs), 90 grams of protein, and 972 ml of H2O.  Total free water: 1153 ml daily  -If feedings are initiated, monitor Mg, K, and Phos daily and replete as needed secondary to high refeeding risk  NUTRITION DIAGNOSIS:   Moderate Malnutrition related to chronic illness (CVA) as evidenced by mild fat depletion, mild muscle depletion, percent weight loss.  GOAL:   Patient will meet greater than or equal to 90% of their needs  MONITOR:   Vent status  REASON FOR ASSESSMENT:   Ventilator    ASSESSMENT:   Pt with significant PMH of Left displaced femoral neck fracture, T2DM, CAD with Hx of CABG X 3, PAD s/p revascularization 2022, PSVT, Anemia, Hypothyroidism,hypertension, HLD, CVA (02/2022) who presented with chief complaints of altered mental status and shortness of breath.  Pt admitted with acute hypoxic respiraotry failure in the setting of toxic metabolic encepahlopathy.    Patient is currently intubated on ventilator support. OGT currently connected to low intermittent suction. MV: 6.5 L/min Temp (24hrs), Avg:98.3 F (36.8 C), Min:97.7 F (36.5 C), Max:99 F (37.2 C)  Propofol: 3.8 ml/hr (provides 100 kcals daily)  Reviewed I/O's: +2.7 L x 24 hours and +5.3 L since admission  UOP: 785 ml x 24 hours  NGT output: 100 ml x 24 hours  Case discussed with RN, MD, and during ICU rounds. Pt is currently off insulin drip and MD suspects that DKA is likely related to infection (?cholecystitis). Plan to keep NPO at this time for  potential interventions.   Reviewed wt hx; pt has experienced a 9.2% wt loss over the past 3 months, which is significant for time frame.   Medications reviewed and include colace, miralax, and levophed.   Lab Results  Component Value Date   HGBA1C 8.8 (H) 05/10/2022   PTA DM medications are 20 units insulin glargine daily at bedtime and 10-12 units insulin lispro TID.   Labs reviewed: K: 3.2, CBGS: 147 (inpatient orders for glycemic control are 0-9 units insulin aspart every 4 hours).    NUTRITION - FOCUSED PHYSICAL EXAM:  Flowsheet Row Most Recent Value  Orbital Region Mild depletion  Upper Arm Region No depletion  Thoracic and Lumbar Region No depletion  Buccal Region Mild depletion  Temple Region Mild depletion  Clavicle Bone Region No depletion  Clavicle and Acromion Bone Region No depletion  Scapular Bone Region No depletion  Dorsal Hand No depletion  Patellar Region Mild depletion  Anterior Thigh Region Mild depletion  Posterior Calf Region Mild depletion  Edema (RD Assessment) Mild  Hair Reviewed  Eyes Reviewed  Mouth Reviewed  Skin Reviewed  Nails Reviewed       Diet Order:   Diet Order             Diet NPO time specified  Diet effective now                   EDUCATION NEEDS:   Not appropriate for education at this time  Skin:  Skin Assessment: Reviewed RN  Assessment  Last BM:  Unknown  Height:   Ht Readings from Last 1 Encounters:  08/08/22 '5\' 8"'$  (1.727 m)    Weight:   Wt Readings from Last 1 Encounters:  08/08/22 63.5 kg    Ideal Body Weight:  63.6 kg  BMI:  Body mass index is 21.29 kg/m.  Estimated Nutritional Needs:   Kcal:  1334  Protein:  95-110 grams  Fluid:  > 1.3 L    Loistine Chance, RD, LDN, [place] Registered Dietitian II Certified Diabetes Care and Education Specialist Please refer to [facility] for RD and/or RD on-call/weekend/after hours pager

## 2022-08-10 ENCOUNTER — Inpatient Hospital Stay: Payer: Medicare Other

## 2022-08-10 DIAGNOSIS — R4182 Altered mental status, unspecified: Secondary | ICD-10-CM

## 2022-08-10 DIAGNOSIS — N179 Acute kidney failure, unspecified: Secondary | ICD-10-CM | POA: Diagnosis not present

## 2022-08-10 DIAGNOSIS — I480 Paroxysmal atrial fibrillation: Secondary | ICD-10-CM | POA: Diagnosis not present

## 2022-08-10 DIAGNOSIS — G9341 Metabolic encephalopathy: Secondary | ICD-10-CM | POA: Diagnosis not present

## 2022-08-10 DIAGNOSIS — I214 Non-ST elevation (NSTEMI) myocardial infarction: Secondary | ICD-10-CM | POA: Diagnosis not present

## 2022-08-10 LAB — CBC
HCT: 30.6 % — ABNORMAL LOW (ref 36.0–46.0)
Hemoglobin: 10 g/dL — ABNORMAL LOW (ref 12.0–15.0)
MCH: 29.6 pg (ref 26.0–34.0)
MCHC: 32.7 g/dL (ref 30.0–36.0)
MCV: 90.5 fL (ref 80.0–100.0)
Platelets: 173 10*3/uL (ref 150–400)
RBC: 3.38 MIL/uL — ABNORMAL LOW (ref 3.87–5.11)
RDW: 14.8 % (ref 11.5–15.5)
WBC: 16.8 10*3/uL — ABNORMAL HIGH (ref 4.0–10.5)
nRBC: 0.1 % (ref 0.0–0.2)

## 2022-08-10 LAB — COMPREHENSIVE METABOLIC PANEL
ALT: 81 U/L — ABNORMAL HIGH (ref 0–44)
AST: 169 U/L — ABNORMAL HIGH (ref 15–41)
Albumin: 2.6 g/dL — ABNORMAL LOW (ref 3.5–5.0)
Alkaline Phosphatase: 80 U/L (ref 38–126)
Anion gap: 8 (ref 5–15)
BUN: 41 mg/dL — ABNORMAL HIGH (ref 8–23)
CO2: 31 mmol/L (ref 22–32)
Calcium: 8 mg/dL — ABNORMAL LOW (ref 8.9–10.3)
Chloride: 100 mmol/L (ref 98–111)
Creatinine, Ser: 2.33 mg/dL — ABNORMAL HIGH (ref 0.44–1.00)
GFR, Estimated: 21 mL/min — ABNORMAL LOW (ref >60)
Glucose, Bld: 142 mg/dL — ABNORMAL HIGH (ref 70–99)
Potassium: 4.2 mmol/L (ref 3.5–5.1)
Sodium: 139 mmol/L (ref 135–145)
Total Bilirubin: 0.5 mg/dL (ref 0.3–1.2)
Total Protein: 5.2 g/dL — ABNORMAL LOW (ref 6.5–8.1)

## 2022-08-10 LAB — BLOOD GAS, VENOUS
Acid-Base Excess: 15.3 mmol/L — ABNORMAL HIGH (ref 0.0–2.0)
Bicarbonate: 41.2 mmol/L — ABNORMAL HIGH (ref 20.0–28.0)
O2 Saturation: 71.8 %
Patient temperature: 37
pCO2, Ven: 54 mmHg (ref 44–60)
pH, Ven: 7.49 — ABNORMAL HIGH (ref 7.25–7.43)
pO2, Ven: 47 mmHg — ABNORMAL HIGH (ref 32–45)

## 2022-08-10 LAB — TROPONIN I (HIGH SENSITIVITY): Troponin I (High Sensitivity): >24000 ng/L (ref <18)

## 2022-08-10 LAB — GLUCOSE, CAPILLARY
Glucose-Capillary: 136 mg/dL — ABNORMAL HIGH (ref 70–99)
Glucose-Capillary: 76 mg/dL (ref 70–99)
Glucose-Capillary: 96 mg/dL (ref 70–99)
Glucose-Capillary: 121 mg/dL — ABNORMAL HIGH (ref 70–99)
Glucose-Capillary: 98 mg/dL (ref 70–99)

## 2022-08-10 LAB — PHOSPHORUS: Phosphorus: 3.3 mg/dL (ref 2.5–4.6)

## 2022-08-10 LAB — HEPARIN LEVEL (UNFRACTIONATED): Heparin Unfractionated: 0.7 IU/mL (ref 0.30–0.70)

## 2022-08-10 LAB — MAGNESIUM: Magnesium: 1.7 mg/dL (ref 1.7–2.4)

## 2022-08-10 LAB — PROCALCITONIN: Procalcitonin: 5.52 ng/mL

## 2022-08-10 MED ORDER — MAGNESIUM SULFATE IN D5W 1-5 GM/100ML-% IV SOLN
1.0000 g | Freq: Once | INTRAVENOUS | Status: AC
  Administered 2022-08-10: 1 g via INTRAVENOUS
  Filled 2022-08-10: qty 100

## 2022-08-10 MED ORDER — INSULIN ASPART 100 UNIT/ML IJ SOLN
0.0000 [IU] | INTRAMUSCULAR | Status: DC
  Administered 2022-08-10: 1 [IU] via SUBCUTANEOUS
  Administered 2022-08-12 (×2): 2 [IU] via SUBCUTANEOUS
  Administered 2022-08-12: 5 [IU] via SUBCUTANEOUS
  Administered 2022-08-13: 3 [IU] via SUBCUTANEOUS
  Administered 2022-08-13: 5 [IU] via SUBCUTANEOUS
  Administered 2022-08-13: 1 [IU] via SUBCUTANEOUS
  Administered 2022-08-14: 9 [IU] via SUBCUTANEOUS
  Administered 2022-08-14 (×2): 7 [IU] via SUBCUTANEOUS
  Administered 2022-08-14: 9 [IU] via SUBCUTANEOUS
  Administered 2022-08-15: 3 [IU] via SUBCUTANEOUS
  Administered 2022-08-15: 7 [IU] via SUBCUTANEOUS
  Administered 2022-08-15: 1 [IU] via SUBCUTANEOUS
  Administered 2022-08-15: 7 [IU] via SUBCUTANEOUS
  Administered 2022-08-15: 2 [IU] via SUBCUTANEOUS
  Administered 2022-08-15 – 2022-08-16 (×2): 5 [IU] via SUBCUTANEOUS
  Administered 2022-08-16: 2 [IU] via SUBCUTANEOUS
  Administered 2022-08-16: 7 [IU] via SUBCUTANEOUS
  Administered 2022-08-16: 2 [IU] via SUBCUTANEOUS
  Filled 2022-08-10 (×21): qty 1

## 2022-08-10 MED ORDER — INSULIN GLARGINE-YFGN 100 UNIT/ML ~~LOC~~ SOLN
8.0000 [IU] | Freq: Every day | SUBCUTANEOUS | Status: DC
  Administered 2022-08-10 – 2022-08-16 (×6): 8 [IU] via SUBCUTANEOUS
  Filled 2022-08-10 (×7): qty 0.08

## 2022-08-10 MED ORDER — INSULIN ASPART 100 UNIT/ML IJ SOLN
0.0000 [IU] | INTRAMUSCULAR | Status: DC
  Administered 2022-08-10: 5 [IU] via SUBCUTANEOUS
  Administered 2022-08-10: 2 [IU] via SUBCUTANEOUS
  Filled 2022-08-10 (×2): qty 1

## 2022-08-10 NOTE — Consult Note (Addendum)
[facility] for Electrolyte Monitoring and Replacement   Recent Labs: Potassium (mmol/L)  Date Value  08/10/2022 4.2   Magnesium (mg/dL)  Date Value  08/10/2022 1.7   Calcium (mg/dL)  Date Value  08/10/2022 8.0 (L)   Albumin (g/dL)  Date Value  08/10/2022 2.6 (L)  07/15/2022 3.9   Phosphorus (mg/dL)  Date Value  08/10/2022 3.3   Sodium (mmol/L)  Date Value  08/10/2022 139  07/15/2022 138     Assessment: Pharmacy has been consulted to monitor and replace electrolytes in 81yo female admitted with acute respiratory failure, AKI, DKA, and lactic acidosis. Patient was transferred to ICU intubated  Goal of Therapy:  Electrolytes WNL  Plan:  1 gram IV magnesium sulfate x 1 Recheck electrolytes in am  Dallie Piles ,PharmD Clinical Pharmacist 08/10/2022 7:16 AM

## 2022-08-10 NOTE — Progress Notes (Signed)
NAME:  Carrie Kane, MRN:  [id number], DOB:  [birth year], LOS: 2 ADMISSION DATE:  08/08/2022, CHIEF COMPLAINT:  AMS  History of Present Illness:  81 y.o. female with significant PMH of left displaced femoral neck fracture, T2DM, CAD with Hx of CABG X 3, PAD s/p revascularization 2022, PSVT, Anemia, Hypothyroidism, hypertension, HLD, CVA (02/2022) who presented to the ED with chief complaints of altered mental status and shortness of breath.  On Presentation, she was noted to be hypotensive, have severe metabolic acidosis and hyperglycemia, with a positive beta-hydroxybutarate. She was managed for DKA with closure of her gap and improvement of her blood sugars. Troponin was elevated (and trending upwards, with cardiology consulted) and she was hypotensive requiring pressor support. Pan CT notable for a small PE (no signs of obstructive shock on TTE) and for pericholecystic fluid. She is intubated and ventilated and in the ICU for further management.  Pertinent  Medical History  Left displaced femoral neck fracture (HCC) T2DM CAD with Hx of CABG 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) ([place]) Anemia Hypothyroidism Essential hypertension HLD 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) Malnutrition of moderate degree  Significant Hospital Events: Including procedures, antibiotic start and stop dates in addition to other pertinent events   08/08/22: Admitted to ICU with toxic metabolic encephalopathy in the setting of severe DKA, Sepsis and probable NSTEMI 08/09/22: Surgery consulted for concern of cholecystitis. Gap closed. D/C fluids and bicarb 08/09/22: Will attempt SBT with possible extubation today.  Nephrology consulted due to worsening acute renal failure   Cultures:  MRSA PCR 10/22>>negative  COVID-19 10/22>>negative  Urine 10/22>>negative  Blood x2 10/22>>negative  Respiratory (20 pathogens) 10/23>>negative  Respiratory Culture 10/23>>  Interim History /  Subjective:  As outlined above   Objective   Blood pressure 109/61, pulse 82, temperature 98.1 F (36.7 C), resp. rate (!) 0, height '5\' 8"'$  (1.727 m), weight 72.6 kg, SpO2 99 %.    Vent Mode: PRVC FiO2 (%):  [28 %] 28 % Set Rate:  [16 bmp-22 bmp] 16 bmp Vt Set:  [400 mL] 400 mL PEEP:  [5 cmH20] 5 cmH20 Plateau Pressure:  [14 cmH20] 14 cmH20   Intake/Output Summary (Last 24 hours) at 08/10/2022 0732 Last data filed at 08/10/2022 0600 Gross per 24 hour  Intake 1487.33 ml  Output 300 ml  Net 1187.33 ml    Filed Weights   08/08/22 0200 08/10/22 0447  Weight: 63.5 kg 72.6 kg   Examination  General: Acutely-ill appearing female, sedated and mechanically intubated  HENT: Supple, no JVD presented  Lungs: Clear diminished throughout, even, non labored  Cardiovascular: NSR, rrr, no r/g, 2+ radial/1+ LLE pedal pulses/1+RLE popliteal pulses  Abdomen: Hypoactive BS x4, soft, non distended, non distended Extremities: Normal bulk  Neuro: Alert and following commands, PERRL GU: Indwelling foley catheter in place draining dark yellow urine   Resolved Hospital Problem list     Assessment & Plan:  81 year old female presenting to the hospital with altered mental status and respiratory failure found to have DKA and shock of unclear source.  Toxic Metabolic Encephalopathy secondary to DKA, shock, and possible infection  Acute hypoxic respiratory failure in the setting of toxic metabolic encephalopathy  Bronchiectasis  Mechanical intubation  - Continue ventilator support with lung protective strategies  - Wean PEEP and FiO2 for sats greater than 90%. - Head of bed elevated 30 degrees. - Plateau pressures less than 30 cm H20.  - Follow intermittent chest x-ray and ABG.   - Will  perform SBT today if tolerating will extubate  - Ensure adequate pulmonary hygiene  - VAP bundle in place  - Prn propofol gtt and fentanyl to maintain RASS goal  - PAD protocol wean sedation/analgesia for RASS  goal 0 - WUA daily   NSTEMI  CAD (s/p multiple PCI and CABG) New onset Afib Shock: possible sepsis and hypovolemia  - Continuous telemetry monitoring  - Trend troponin until peaked - Cardiology consulted appreciate input: goal CVP of 12; continue ASA  - Continue heparin gtt for now  - Will need cardiac cath but due to acute kidney injury and critical illness will hold off for now per Cardiology   Acute kidney injury secondary to ATN in the setting of hypotension and DKA~worsening  - Trend BMP - Replace electrolytes as indicated  - Monitor UOP - Nephrology consulted appreciate input: may require CRRT   Sepsis  Pericholecystic fluid per CT Abd Pelvis and bilirubin elevated on admission although [place] RUQ negative for acute cholecystitis  - Trend WBC and monitor fever curve  - Trend PCT - Follow cultures  - Continue metronidazole and cefepime for now  - General surgery consulted appreciate input: if pt declines recommending HIDA scan at this time pt is stable   Anemia without obvious acute blood loss  - Trend CBC - Monitor for s/sx of bleeding  - Continue heparin gtt for NSTEMI   T1DM Hypoglycemia~resolved  DKA~resolved  - CBG's q4hrs  - SSI  - Diabetes coordinator consulted appreciate input: insulin adjusted per recommendations  - Follow hypoglycemia protocol   Best Practice (right click and "Reselect all SmartList Selections" daily)   Diet/type: NPO; if pt remains intubated will start TF's DVT prophylaxis: systemic heparin GI prophylaxis: PPI Lines: Central line Foley:  Yes, and it is still needed Code Status:  full code Last date of multidisciplinary goals of care discussion [08/10/2022]  Will update family when they arrive at bedside  Labs   CBC: Recent Labs  Lab 08/08/22 0210 08/08/22 0339 08/09/22 0526 08/10/22 0420  WBC 19.0* 21.1* 19.2* 16.8*  NEUTROABS 14.1* 16.0*  --   --   HGB 9.8* 8.9* 9.6* 10.0*  HCT 34.8* 30.2* 28.6* 30.6*  MCV 105.8* 99.3 89.9  90.5  PLT 214 195 188 173     Basic Metabolic Panel: Recent Labs  Lab 08/08/22 0513 08/08/22 1047 08/08/22 1439 08/08/22 2012 08/09/22 0526 08/10/22 0420  NA  --  139 139 136 140 139  K  --  2.7* 2.7* 3.6 3.2* 4.2  CL  --  95* 96* 97* 97* 100  CO2  --  21* 27 30 33* 31  GLUCOSE  --  468* 280* 119* 157* 142*  BUN  --  35* 34* 33* 35* 41*  CREATININE  --  1.78* 1.79* 1.68* 1.93* 2.33*  CALCIUM  --  8.7* 8.9 8.4* 8.3* 8.0*  MG 2.1  --   --   --   --  1.7  PHOS 8.1*  --   --   --   --  3.3    GFR: Estimated Creatinine Clearance: 19.7 mL/min (A) (by C-G formula based on SCr of 2.33 mg/dL (H)). Recent Labs  Lab 08/08/22 0210 08/08/22 0339 08/08/22 1025 08/09/22 0526 08/09/22 0745 08/10/22 0420  PROCALCITON 0.99  --   --  7.02  --  5.52  WBC 19.0* 21.1*  --  19.2*  --  16.8*  LATICACIDVEN >9.0*  --  >9.0*  --  2.8*  --  Liver Function Tests: Recent Labs  Lab 08/08/22 0210 08/09/22 0747 08/10/22 0420  AST 105* 267* 169*  ALT 38 92* 81*  ALKPHOS 88 70 80  BILITOT 1.8* 0.5 0.5  PROT 6.6 5.0* 5.2*  ALBUMIN 3.9 2.7* 2.6*    No results for input(s): "LIPASE", "AMYLASE" in the last 168 hours. No results for input(s): "AMMONIA" in the last 168 hours.  ABG    Component Value Date/Time   PHART 7.54 (H) 08/10/2022 0337   PCO2ART 39 08/10/2022 0337   PO2ART 121 (H) 08/10/2022 0337   HCO3 33.3 (H) 08/10/2022 0337   TCO2 23 05/10/2017 1736   ACIDBASEDEF 2.4 (H) 08/08/2022 1047   O2SAT 99.9 08/10/2022 0337     Coagulation Profile: Recent Labs  Lab 08/08/22 0210 08/09/22 0745  INR 1.2 1.2     Cardiac Enzymes: No results for input(s): "CKTOTAL", "CKMB", "CKMBINDEX", "TROPONINI" in the last 168 hours.  HbA1C: Hgb A1c MFr Bld  Date/Time Value Ref Range Status  05/10/2022 09:38 PM 8.8 (H) 4.8 - 5.6 % Final    Comment:    (NOTE) Pre diabetes:          5.7%-6.4%  Diabetes:              >6.4%  Glycemic control for   <7.0% adults with diabetes    08/25/2021 10:42 AM 8.9 (H) 4.8 - 5.6 % Final    Comment:             Prediabetes: 5.7 - 6.4          Diabetes: >6.4          Glycemic control for adults with diabetes: <7.0     CBG: Recent Labs  Lab 08/09/22 1111 08/09/22 1553 08/09/22 1937 08/09/22 2338 08/10/22 0413  GLUCAP 195* 221* 266* 234* 136*     Review of Systems:   Unable to obtain pt mechanically intubated   Past Medical History:  She,  has a past medical history of CAD (coronary artery disease), Diastolic dysfunction, Hyperlipidemia, Hypertensive heart disease, Insulin dependent diabetes mellitus, PAD (peripheral artery disease) ([place]), PSVT (paroxysmal supraventricular tachycardia), and Stroke ([place]).   Surgical History:   Past Surgical History:  Procedure Laterality Date   ABDOMINAL HYSTERECTOMY     APPLICATION OF WOUND VAC  05/11/2022   Procedure: APPLICATION OF WOUND VAC;  Surgeon: Renee Harder, MD;  Location: [facility];  Service: Orthopedics;;   CARDIAC CATHETERIZATION     CATARACT EXTRACTION W/PHACO Right 10/26/2021   Procedure: CATARACT EXTRACTION PHACO AND INTRAOCULAR LENS PLACEMENT ([facility]) RIGHT DIABETIC 16.27 01:23.8;  Surgeon: Eulogio Bear, MD;  Location: [place];  Service: Ophthalmology;  Laterality: Right;  Please leave arrival at 8:00   CATARACT EXTRACTION W/PHACO Left 11/09/2021   Procedure: CATARACT EXTRACTION PHACO AND INTRAOCULAR LENS PLACEMENT ([place]) LEFT DIABETIC;  Surgeon: Eulogio Bear, MD;  Location: [place];  Service: Ophthalmology;  Laterality: Left;  13.39 01:13.0   CORONARY ARTERY BYPASS GRAFT N/A 05/09/2017   Procedure: CORONARY ARTERY BYPASS GRAFTING (CABG) x 3 using left internal mammary artery and right greater saphenous vein harvested endoscopically;  Surgeon: Ivin Poot, MD;  Location: [place];  Service: Open Heart Surgery;  Laterality: N/A;   EYE SURGERY     HIP ARTHROPLASTY Left 05/11/2022   Procedure: ARTHROPLASTY BIPOLAR HIP  (HEMIARTHROPLASTY);  Surgeon: Renee Harder, MD;  Location: [facility];  Service: Orthopedics;  Laterality: Left;   INTRAOPERATIVE TRANSESOPHAGEAL ECHOCARDIOGRAM N/A 05/09/2017   Procedure: INTRAOPERATIVE TRANSESOPHAGEAL  ECHOCARDIOGRAM;  Surgeon: Ivin Poot, MD;  Location: [place];  Service: Open Heart Surgery;  Laterality: N/A;   IR RADIOLOGIST EVAL & MGMT  12/04/2020   LEFT HEART CATH AND CORONARY ANGIOGRAPHY N/A 05/02/2017   Procedure: Left Heart Cath and Coronary Angiography;  Surgeon: Wellington Hampshire, MD;  Location: [place] [facility];  Service: Cardiovascular;  Laterality: N/A;   LOWER EXTREMITY ANGIOGRAPHY Left 02/09/2021   Procedure: LOWER EXTREMITY ANGIOGRAPHY;  Surgeon: Algernon Huxley, MD;  Location: [place] [facility];  Service: Cardiovascular;  Laterality: Left;   LOWER EXTREMITY ANGIOGRAPHY Right 02/16/2021   Procedure: LOWER EXTREMITY ANGIOGRAPHY;  Surgeon: Algernon Huxley, MD;  Location: [facility];  Service: Cardiovascular;  Laterality: Right;     Social History:   reports that she has never smoked. She has never used smokeless tobacco. She reports that she does not drink alcohol and does not use drugs.   Family History:  Her family history includes Breast cancer in her maternal aunt.   Allergies Allergies  Allergen Reactions   Shellfish Allergy Anaphylaxis    Throat swelling   Flexeril [Cyclobenzaprine] Hypertension     Home Medications  Prior to Admission medications   Medication Sig Start Date End Date Taking? Authorizing Provider  aspirin EC 81 MG tablet Take 81 mg by mouth 2 (two) times daily. 08/02/17  Yes Gollan, Kathlene November, MD  atorvastatin (LIPITOR) 40 MG tablet Take 1 tablet (40 mg total) by mouth daily. 06/23/21  Yes Minna Merritts, MD  benazepril (LOTENSIN) 40 MG tablet TAKE 1 TABLET DAILY 06/02/22  Yes Masoud, Viann Shove, MD  cholecalciferol (VITAMIN D) 1000 units tablet Take 2,000 Units by mouth daily at 12 noon.    Yes [provider]   cloNIDine (CATAPRES) 0.2 MG tablet TAKE 1 TABLET TWICE A DAY 06/02/22  Yes Gollan, Kathlene November, MD  clopidogrel (PLAVIX) 75 MG tablet Take 1 tablet (75 mg total) by mouth daily. 06/23/21  Yes Minna Merritts, MD  ferrous sulfate 325 (65 FE) MG EC tablet Take 1 tablet (325 mg total) by mouth daily at 2 PM. 12/23/20  Yes Masoud, Viann Shove, MD  HUMALOG KWIKPEN 100 UNIT/ML KwikPen INJECT 38 UNITS UNDER THE SKIN DAILY Patient taking differently: 10-12 Units in the morning and at bedtime. 10-12 units BID prn 03/22/22  Yes Masoud, Viann Shove, MD  hydrochlorothiazide (MICROZIDE) 12.5 MG capsule Take 12.5 mg by mouth daily.   Yes [provider]  insulin glargine (LANTUS) 100 UNIT/ML injection INJECT 45 UNITS UNDER THE SKIN AT BEDTIME Patient taking differently: 20 Units at bedtime. Pt takes 20 units depending on blood sugar level 02/26/21  Yes Beckie Salts, FNP  levothyroxine (SYNTHROID) 100 MCG tablet TAKE 1 TABLET DAILY BEFORE BREAKFAST 03/22/22  Yes Masoud, Viann Shove, MD  Multiple Vitamin (MULTIVITAMIN) capsule Take 1 capsule by mouth daily at 12 noon.    Yes [provider]  valsartan (DIOVAN) 320 MG tablet TAKE 1 TABLET DAILY 07/27/22  Yes Gollan, Kathlene November, MD  BD INSULIN SYRINGE U/F 30G X 1/2" 0.5 ML MISC  03/29/20   [provider]  Na Sulfate-K Sulfate-Mg Sulf 17.5-3.13-1.6 GM/177ML SOLN Take by mouth as directed. 07/06/22   [provider]  nitroGLYCERIN (NITROSTAT) 0.4 MG SL tablet Place 1 tablet (0.4 mg total) under the tongue every 5 (five) minutes as needed for chest pain. 06/23/21   Minna Merritts, MD  Polyethyl Glycol-Propyl Glycol (SYSTANE OP) Apply 1 drop to eye as needed (dry eyes).  [provider]     Critical care time: 40 minutes     Donell Beers, [place] Pager [phone] (please enter 7 digits) PCCM Consult Pager [phone] (please enter 7 digits)

## 2022-08-10 NOTE — Progress Notes (Signed)
Patient ID: Carrie Kane, female   DOB: [birth year], 81 y.o.   MRN: [id number]     [facility] Day(s): 2.   Interval History: Patient seen and examined, no acute events or new complaints overnight. Patient evaluated in the morning and she was still sedated on mechanical ventilation. Later in the day patient was able to be extubated. She was in no distress. No abdominal pain.   Vital signs in last 24 hours: [min-max] current  Temp:  [96.1 F (35.6 C)-98.2 F (36.8 C)] 98.2 F (36.8 C) (10/24 1130) Pulse Rate:  [66-87] 67 (10/24 1245) Resp:  [0-23] 10 (10/24 1245) BP: (89-152)/(55-91) 122/59 (10/24 1245) SpO2:  [97 %-100 %] 100 % (10/24 1245) FiO2 (%):  [28 %] 28 % (10/24 0910) Weight:  [72.6 kg] 72.6 kg (10/24 0447)     Height: '5\' 8"'$  (172.7 cm) Weight: 72.6 kg BMI (Calculated): 24.34   Physical Exam:  Constitutional: No distress, drowsy   Respiratory: breathing non-labored at rest  Cardiovascular: regular rate and sinus rhythm  Gastrointestinal: soft, non-tender, and non-distended  Labs:     Latest Ref Rng & Units 08/10/2022    4:20 AM 08/09/2022    5:26 AM 08/08/2022    3:39 AM  CBC  WBC 4.0 - 10.5 K/uL 16.8  19.2  21.1   Hemoglobin 12.0 - 15.0 g/dL 10.0  9.6  8.9   Hematocrit 36.0 - 46.0 % 30.6  28.6  30.2   Platelets 150 - 400 K/uL 173  188  195       Latest Ref Rng & Units 08/10/2022    4:20 AM 08/09/2022    7:47 AM 08/09/2022    5:26 AM  CMP  Glucose 70 - 99 mg/dL 142   157   BUN 8 - 23 mg/dL 41   35   Creatinine 0.44 - 1.00 mg/dL 2.33   1.93   Sodium 135 - 145 mmol/L 139   140   Potassium 3.5 - 5.1 mmol/L 4.2   3.2   Chloride 98 - 111 mmol/L 100   97   CO2 22 - 32 mmol/L 31   33   Calcium 8.9 - 10.3 mg/dL 8.0   8.3   Total Protein 6.5 - 8.1 g/dL 5.2  5.0    Total Bilirubin 0.3 - 1.2 mg/dL 0.5  0.5    Alkaline Phos 38 - 126 U/L 80  70    AST 15 - 41 U/L 169  267    ALT 0 - 44 U/L 81  92      Imaging studies: No new pertinent imaging  studies   Assessment/Plan:  81 y.o. female with sepsis of unknown origin, complicated by pertinent comorbidities including diabetic acidosis, sepsis of unknown origin, critically ill, respiratory failure on mechanical ventilation, diabetes, hypertension, coronary artery disease.   Septic shock of unknown origin -Septic shock has resolved as patient is out of pressors.  -Sepsis of unknown origin.  - Patient is improving clinically with medical management with antibiotic therapy and supportive care.  -Ultrasound with normal gallbladder wall thickening, cholelithiasis but no hard findings of cholecystitis to explain patient clinical status.  - Since patient is improving clinically I do not recommend any further workup for gallbladder pathology and recommend to continue current management.  - May start diet and assess for toleration once patient is more awake.  - No surgical management is indicated.   Arnold Long, MD

## 2022-08-10 NOTE — Progress Notes (Signed)
[place] for heparin infusion Indication: NSTEMI  Allergies  Allergen Reactions   Shellfish Allergy Anaphylaxis    Throat swelling   Flexeril [Cyclobenzaprine] Hypertension    Patient Measurements: Height: '5\' 8"'$  (172.7 cm) Weight: 72.6 kg (160 lb 0.9 oz) IBW/kg (Calculated) : 63.9 Heparin Dosing Weight: 63.5 kg  Vital Signs: Temp: 98.1 F (36.7 C) (10/24 0400) Temp Source: Esophageal (10/23 1930) BP: 89/55 (10/24 0400) Pulse Rate: 87 (10/24 0400)  Labs: Recent Labs    08/08/22 0210 08/08/22 0339 08/08/22 0513 08/08/22 1047 08/08/22 2012 08/08/22 2242 08/09/22 0526 08/09/22 0745 08/09/22 1048 08/10/22 0420  HGB 9.8* 8.9*  --   --   --   --  9.6*  --   --  10.0*  HCT 34.8* 30.2*  --   --   --   --  28.6*  --   --  30.6*  PLT 214 195  --   --   --   --  188  --   --  173  APTT 27  --   --   --   --   --   --  113*  --   --   LABPROT 15.5*  --   --   --   --   --   --  15.5*  --   --   INR 1.2  --   --   --   --   --   --  1.2  --   --   HEPARINUNFRC  --   --   --    < >  --  0.68 0.63  --   --  0.70  CREATININE 1.73* 1.85*  --    < > 1.68*  --  1.93*  --   --  2.33*  TROPONINIHS 3,038*  --  22,979*  --   --   --   --  >24,000* >24,000*  --    < > = values in this interval not displayed.     Estimated Creatinine Clearance: 19.7 mL/min (A) (by C-G formula based on SCr of 2.33 mg/dL (H)).   Medical History: Past Medical History:  Diagnosis Date   CAD (coronary artery disease)    a. 04/2017 Cath: LM 65, LAD 80p, 33m LCX 95ost, 767mEF 45-50%; b. 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   Diastolic dysfunction    a. 04/2017 Echo: EF 55-60%, no rwma, Gr1 DD, mildly dil LA; b. 08/2020 Echo: EF 55-60%, no rwma, GrII DD, nl RV fxn, RVSP 3037m, mod dil LA, mildly dil RA, Mod TR.   Hyperlipidemia    Hypertensive heart disease    Insulin dependent diabetes mellitus    [facility]peripheral artery disease) ([facility]  [facility]. 02[facility]  PTA/DCBA R Peroneal, R Popliteal, distal R SFA.   PSVT (paroxysmal supraventricular tachycardia)    a. 02/2022 [facility]: Predominantly sinus rhythm @ 61 (36-218). 2 NSVT runs (fastest/longest 6 beats @ 218). 37 SVT/A tach runs (fastest 185 x 5 beats, longest 32.4 secs @ 107). Triggered events = RSR, PAC.   [facility]  [facility]. 01/2022 R sided wkns/aphasia/tremor/slurred speech-->Ss resolved; b. 02/2022 MRI/A: punctate subacute inf vs artifact-post limb of L int capsule. 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 Mod, chronic small vessel isch changes within the cerebral white matter.  Subcm chronic infarct- L cerebellar hemisphere.  Sev dzs prox P2 seg of R PCA. Mod dzs A1 R ant cer  art.    Assessment: Pt is a 81 yo female presenting to ED w/ AMS, found with elevated Troponin I level, being treated for NSTEMI.  Goal of Therapy:  INR 2-3 Monitor platelets by anticoagulation protocol: Yes   Date/Time  HL Rate 10/22'@1439'$   0.62 750 units/hr, therapeutic x1 10/22 2242  0.68 Therapeutic x 2 10/23 0526  0.63 Therapeutic x 3 10/24 0420  0.70 Therapeutic x 4  Plan:  Upper end of therapeutic Will slightly decrease heparin infusion to 700 units/hr Will recheck HL w/ AM daily while therapeutic CBC daily while on heparin  Pearla Dubonnet, PharmD Clinical Pharmacist 08/10/2022 4:51 AM

## 2022-08-10 NOTE — Progress Notes (Signed)
Pt extubated to BiPAP pre MD request. Pt tolerated procedure well. Will Continue to monitor

## 2022-08-10 NOTE — Progress Notes (Signed)
Updated pts husband Dr. Berna Bue at bedside regarding pt condition and current plan of care.  All questions were answered.  Donell Beers, [place] Pager [phone] (please enter 7 digits) PCCM Consult Pager [phone] (please enter 7 digits)

## 2022-08-10 NOTE — Inpatient Diabetes Management (Signed)
Inpatient Diabetes Program Recommendations  AACE/ADA: New Consensus Statement on Inpatient Glycemic Control   Target Ranges:  Prepandial:   less than 140 mg/dL      Peak postprandial:   less than 180 mg/dL (1-2 hours)      Critically ill patients:  140 - 180 mg/dL    Latest Reference Range & Units 08/10/22 04:13 08/10/22 07:50  Glucose-Capillary 70 - 99 mg/dL 136 (H)  Novolog 2 units 76    Latest Reference Range & Units 08/09/22 07:43 08/09/22 10:34 08/09/22 10:36 08/09/22 11:11 08/09/22 15:53 08/09/22 19:37 08/09/22 23:38  Glucose-Capillary 70 - 99 mg/dL 147 (H)  Novolog 1 unit  58 (L) 59 (L) 195 (H) 221 (H) 266 (H)  Novolog 3 units 234 (H)  Novolog 5 units    Latest Reference Range & Units 08/09/22 00:11 08/09/22 01:41 08/09/22 02:45 08/09/22 04:30  Glucose-Capillary 70 - 99 mg/dL 134 (H)    Semglee 10 units '@00'$ :23 127 (H) 136 (H)  Novolog 1 unit 126 (H)   Review of Glycemic Control  Diabetes history: DM2 Outpatient Diabetes medications: Lantus 20 units QHS, Humalog 10-12 units BID Current orders for Inpatient glycemic control: Novolog 0-15 units Q4H  Inpatient Diabetes Program Recommendations:    Insulin: Patient received Semglee 10 units at 00:23 on 08/09/22 at transition of IV insulin. Noted glucose down to 58 mg/dl at 10:34 am on 10/23 and 76 mg/dl today; likely due to Novolog correction insulin. Please consider ordering Semglee 8 units daily (to start now) and decrease Novolog correction to 0-9 units Q4H.  If tube feedings are started, will likely need to add Novolog tube feeding coverage.  Thanks, Carrie Alderman, RN, MSN, [place] Diabetes Coordinator Inpatient Diabetes Program [phone] (Team Pager from 8am to [place])

## 2022-08-10 NOTE — Progress Notes (Signed)
Progress Note  Patient Name: Carrie Kane Date of Encounter: 08/10/2022  Primary Cardiologist: [place] intubated and sedated on Levophed. No further paroxysms of Afib noted on telemetry.    Inpatient Medications    Scheduled Meds:  aspirin  81 mg Per Tube Daily   Chlorhexidine Gluconate Cloth  6 each Topical Daily   dextrose  1 ampule Intravenous Once   docusate  100 mg Per Tube BID   insulin aspart  0-15 Units Subcutaneous Q4H   mouth rinse  15 mL Mouth Rinse Q2H   pantoprazole (PROTONIX) IV  40 mg Intravenous QHS   polyethylene glycol  17 g Per Tube Daily   Continuous Infusions:  ceFEPime (MAXIPIME) IV Stopped (08/09/22 2231)   heparin 700 Units/hr (08/10/22 0600)   magnesium sulfate bolus IVPB     metronidazole Stopped (08/10/22 0524)   norepinephrine (LEVOPHED) Adult infusion 16 mcg/min (08/10/22 0600)   propofol (DIPRIVAN) infusion 20 mcg/kg/min (08/10/22 0600)   PRN Meds: dextrose, docusate sodium, fentaNYL (SUBLIMAZE) injection, mouth rinse, polyethylene glycol   Vital Signs    Vitals:   08/10/22 0447 08/10/22 0500 08/10/22 0600 08/10/22 0700  BP: 100/61 111/65 113/68 109/61  Pulse: 86 86 86 82  Resp: '17 17 19 '$ (!) 0  Temp: 98.1 F (36.7 C) 98.1 F (36.7 C) 98.1 F (36.7 C)   TempSrc:  Esophageal    SpO2: 100% 100% 100% 99%  Weight: 72.6 kg     Height:        Intake/Output Summary (Last 24 hours) at 08/10/2022 0723 Last data filed at 08/10/2022 0600 Gross per 24 hour  Intake 1487.33 ml  Output 300 ml  Net 1187.33 ml    Filed Weights   08/08/22 0200 08/10/22 0447  Weight: 63.5 kg 72.6 kg    Telemetry    SR without further paroxysms of Afib - Personally Reviewed  ECG    No new tracings - Personally Reviewed  Physical Exam   GEN: No acute distress.   Neck: JVD difficult to assess secondary to respiratory support apparatus and brisk carotid pulsations. Cardiac: RRR, no murmurs, rubs, or gallops.  Respiratory:  Vented breath sounds bilaterally.  GI: Soft, non-distended.   MS: Trace bilateral pretibial edema; No deformity. Neuro:  Intubated and sedated.  Psych: Intubated and sedated.  Labs    Chemistry Recent Labs  Lab 08/08/22 0210 08/08/22 0339 08/08/22 2012 08/09/22 0526 08/09/22 0747 08/10/22 0420  NA 137   < > 136 140  --  139  K 3.8   < > 3.6 3.2*  --  4.2  CL 93*   < > 97* 97*  --  100  CO2 <7*   < > 30 33*  --  31  GLUCOSE 830*   < > 119* 157*  --  142*  BUN 33*   < > 33* 35*  --  41*  CREATININE 1.73*   < > 1.68* 1.93*  --  2.33*  CALCIUM 9.8   < > 8.4* 8.3*  --  8.0*  PROT 6.6  --   --   --  5.0* 5.2*  ALBUMIN 3.9  --   --   --  2.7* 2.6*  AST 105*  --   --   --  267* 169*  ALT 38  --   --   --  92* 81*  ALKPHOS 88  --   --   --  70 80  BILITOT 1.8*  --   --   --  0.5 0.5  GFRNONAA 30*   < > 31* 26*  --  21*  ANIONGAP  --    < > 9 10  --  8   < > = values in this interval not displayed.      Hematology Recent Labs  Lab 08/08/22 0339 08/09/22 0526 08/10/22 0420  WBC 21.1* 19.2* 16.8*  RBC 3.04* 3.18* 3.38*  HGB 8.9* 9.6* 10.0*  HCT 30.2* 28.6* 30.6*  MCV 99.3 89.9 90.5  MCH 29.3 30.2 29.6  MCHC 29.5* 33.6 32.7  RDW 14.5 14.0 14.8  PLT 195 188 173     Cardiac EnzymesNo results for input(s): "TROPONINI" in the last 168 hours. No results for input(s): "TROPIPOC" in the last 168 hours.   BNP Recent Labs  Lab 08/08/22 0210  BNP 261.2*      DDimer No results for input(s): "DDIMER" in the last 168 hours.   Radiology    CT Angio Chest Pulmonary Embolism (PE) W or WO Contrast  Result Date: 08/08/2022 IMPRESSION: 1. Chronically dilated pulmonary trunk. No arterial embolus is seen. 2. Cardiomegaly with venous distention, slight interstitial edema in the lung apices and bases, minimal pleural effusions and body wall anasarca. 3. Mesenteric congestion and minimal ascites in the abdomen and pelvis. 4. Increased bronchiectasis and bronchiolectasis in both  lower lobes since 2005 but no active infiltrate or bronchial impactions. 5. 6 mm and 4 mm right lung ground-glass nodules. Recommend follow-up chest CT 3-6 months. If stable, repeat follow-up CT 18-24 months then at 2 year intervals until 5 years of stability is documented. Consider resection if either nodule develops solid components or substantially grows. These guidelines do not apply to cancer patients and immunocompromised patients. 6. CABG with native CAD, aortoiliac atherosclerosis. 7. Pericholecystic fluid and slightly thickened gallbladder wall with stones. This most likely congestive etiology but please correlate clinically to exclude cholecystitis. No other appreciable source for sepsis is seen. 8. 1.9 x 1.6 cm heterogeneous left adrenal nodule not seen in 2005. Further evaluation recommended. 9. Linear and rounded calcifications along the renal collecting systems, most of which are probably renovascular calcifications. There may be a few small caliceal stones. 10. No collecting system contrast in the delayed phase. This could be on a congestive basis or due to heart failure, but could also be seen with nephrotoxicity. Laboratory and clinical correlation advised. 11. Remaining findings discussed above. Electronically Signed   By: Telford Nab M.D.   On: 08/08/2022 05:31   CT ABDOMEN PELVIS W CONTRAST  Result Date: 08/08/2022 IMPRESSION: 1. Chronically dilated pulmonary trunk. No arterial embolus is seen. 2. Cardiomegaly with venous distention, slight interstitial edema in the lung apices and bases, minimal pleural effusions and body wall anasarca. 3. Mesenteric congestion and minimal ascites in the abdomen and pelvis. 4. Increased bronchiectasis and bronchiolectasis in both lower lobes since 2005 but no active infiltrate or bronchial impactions. 5. 6 mm and 4 mm right lung ground-glass nodules. Recommend follow-up chest CT 3-6 months. If stable, repeat follow-up CT 18-24 months then at 2 year  intervals until 5 years of stability is documented. Consider resection if either nodule develops solid components or substantially grows. These guidelines do not apply to cancer patients and immunocompromised patients. 6. CABG with native CAD, aortoiliac atherosclerosis. 7. Pericholecystic fluid and slightly thickened gallbladder wall with stones. This most likely congestive etiology but please correlate clinically to exclude cholecystitis. No other appreciable source for sepsis is seen. 8. 1.9 x 1.6 cm heterogeneous left adrenal nodule  not seen in 2005. Further evaluation recommended. 9. Linear and rounded calcifications along the renal collecting systems, most of which are probably renovascular calcifications. There may be a few small caliceal stones. 10. No collecting system contrast in the delayed phase. This could be on a congestive basis or due to heart failure, but could also be seen with nephrotoxicity. Laboratory and clinical correlation advised. 11. Remaining findings discussed above. Electronically Signed   By: Telford Nab M.D.   On: 08/08/2022 05:31   DG Chest Port 1 View  Result Date: 08/08/2022 IMPRESSION: 1. Support lines and tubes in appropriate position. 2. No active disease. Electronically Signed   By: Fidela Salisbury M.D.   On: 08/08/2022 03:51   CT HEAD WO CONTRAST (5MM)  Result Date: 08/08/2022 IMPRESSION: Limited examination. Stable senescent changes. No definite acute intracranial hemorrhage or infarct. Electronically Signed   By: Fidela Salisbury M.D.   On: 08/08/2022 03:11   DG Chest Port 1 View  Result Date: 08/08/2022 IMPRESSION: No evidence of acute chest process or interval changes. Stable post CABG chest with cardiomegaly. Electronically Signed   By: Telford Nab M.D.   On: 08/08/2022 02:32    Cardiac Studies   2D echo 08/08/2022:  1. Left ventricular ejection fraction, by estimation, is 65 to 70%. The  left ventricle has normal function. The left ventricle has no  regional  wall motion abnormalities. There is moderate asymmetric left ventricular  hypertrophy of the septal segment.  Left ventricular diastolic parameters are consistent with Grade I  diastolic dysfunction (impaired relaxation). Elevated left ventricular  end-diastolic pressure.   2. Right ventricular systolic function is normal. The right ventricular  size is normal. There is mildly elevated pulmonary artery systolic  pressure.   3. Left atrial size was mildly dilated.   4. The mitral valve is normal in structure. Trivial mitral valve  regurgitation. No evidence of mitral stenosis.   5. Tricuspid valve regurgitation is mild to moderate.   6. The aortic valve is tricuspid. Aortic valve regurgitation is not  visualized. No aortic stenosis is present.   7. Aortic dilatation noted. There is mild dilatation of the ascending  aorta, measuring 37 mm.   8. The inferior vena cava is dilated in size with <50% respiratory  variability, suggesting right atrial pressure of 15 mmHg.  Patient Profile     81 y.o. female with history of CAD s/p CABG in 2018, HFpEF, CVA in 01/2022, SVT, NSVT, PAD s/p PTA, DM, HTN, and HLD who was admitted with acute hypoxic respiratory failure requiring mechanical ventilation with encephalopathy in the setting of DKA with metabolic derangement, undifferentiated shock and sepsis of unclear etiology and we are seeing for NSTEMI and new onset Afib.   Assessment & Plan    1. CAD s/p CABG with NSTEMI: -Despite significant elevation of troponin, there are no significant ischemic EKG changes noted upon admission -Continue heparin gtt (new onset Afib as well) -In the context of her significant acute illness and comorbid conditions including acute hypoxic respiratory failure requiring mechanical ventilation, AKI, anemia and sepsis of unclear etiology, no plans for emergent cardiac cath at this time, however if she redevelops shock, this may need to be readdressed, though with  declining renal function, this would likely commit her to dialysis  -Once she is improved from her acute illness and stabilized, will need cardiac cath -Addition of Plavix to ASA for medical management of NSTEMI once felt safe by the primary and surgical (work up of  possible cholecystitis) teams  2. New onset Afib: -Noted to have paroxysms of Afib on tele, currently in sinus rhythm, if she has recurrent Afib, IV amiodarone would be our only option at this time as she is not a candidate for AV nodal blocking medications given hypotension requiring vasopressor support and AKI precludes digoxin use -Hyperthyroid on labs this admission -Etiology multifactorial including acute illness and hyperthyroidism  -Not currently on AV nodal blocking medication in the context of hypotension requiring vasopressor support -CHADS2VASc 9 (CHF, HTN, age x 2, DM, CVA x 2, vascular disease, sex category) -Heparin gtt for now -Transition to [facility] prior to discharge with aspirin or Plavix (pending cath findings) as long as hgb remains stable  3. Acute hypoxic respiratory failure with encephalopathy in the setting of DKA with metabolic derangement, undifferentiated shock and sepsis of unclear etiology: -Has central access, obtain CVP, IV hydration to assist with weaning of Levophed -Management per CCM  4. HLD/PAD: -Resume ASA and stain when able  5. Anemia: -HGB stable when compared to admission and improved from values obtained several months prior -Anemia of uncertain etiology  -Management per primary service  6. AKI: -Likely ATN in the setting of hypotension requiring vasopressor support -Avoid nephrotoxic agents  -CCM consulting nephrology   7. Hyperthyroidism: -Possibly in the setting of her acute illness -Likely contributing to her Afib -Management per primary       For questions or updates, please contact [place] Please consult [email or web address] for contact info under Cardiology/STEMI.     Signed, Christell Faith, PA-C [facility] Pager: [phone] 08/10/2022, 7:23 AM

## 2022-08-10 NOTE — Consult Note (Signed)
[facility] Consult Note:08/10/2022    Date of Admission:  08/08/2022           Reason for Consult:  AKI   Referring Provider: Armando Reichert, MD Primary Care Provider: Cletis Athens, MD   History of Presenting Illness:  Carrie Kane is a 81 y.o. female who arrived from home via EMS due to confusion.  She was found to have A-fib with RVR.  She presented with shortness of breath. She was evaluated by critical care for acute respiratory failure requiring mechanical ventilation.  She was started on broad-spectrum antibiotics vancomycin cefepime and Flagyl for suspected sepsis and IV heparin for probable non-STEMI. She was extubated this morning and currently on BiPAP.  Able to answer yes/no questions.  Patient has baseline creatinine of 0.75 from 07/15/2022.  It has increased to 2.33 today.  Review of Systems: ROS-not available due to patient's critical illness  Past Medical History:  Diagnosis Date   CAD (coronary artery disease)    a. 04/2017 Cath: LM 37, LAD 80p, 14m LCX 95ost, 761mEF 45-50%; b. 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   Diastolic dysfunction    a. 04/2017 Echo: EF 55-60%, no rwma, Gr1 DD, mildly dil LA; b. 08/2020 Echo: EF 55-60%, no rwma, GrII DD, nl RV fxn, RVSP 3067m, mod dil LA, mildly dil RA, Mod TR.   Hyperlipidemia    Hypertensive heart disease    Insulin dependent diabetes mellitus    PAD (peripheral artery disease) ([facility]  a. 02/2021 PTA/DCBA R Peroneal, R Popliteal, distal R SFA.   PSVT (paroxysmal supraventricular tachycardia)    a. 02/2022 Zio: Predominantly sinus rhythm @ 61 (36-218). 2 NSVT runs (fastest/longest 6 beats @ 218). 37 SVT/A tach runs (fastest 185 x 5 beats, longest 32.4 secs @ 107). Triggered events = RSR, PAC.   [facility]  a. 01/2022 R sided wkns/aphasia/tremor/slurred speech-->Ss resolved; b. 02/2022 MRI/A: punctate subacute inf vs artifact-post limb of L int capsule. Chronic lacunar infarcts-right  caudate nucleus/right thalamus/left pons. Mod, chronic small vessel isch changes within the cerebral white matter.  Subcm chronic infarct- L cerebellar hemisphere.  Sev dzs prox P2 seg of R PCA. Mod dzs A1 R antClinical cytogeneticist  Social History   Tobacco Use   Smoking status: Never   Smokeless tobacco: Never  Vaping Use   Vaping Use: Never used  Substance Use Topics   Alcohol use: No   Drug use: No    Family History  Problem Relation Age of Onset   Breast cancer Maternal Aunt      OBJECTIVE: Blood pressure 134/60, pulse 74, temperature 98 F (36.7 C), temperature source Oral, resp. rate 13, height '5\' 8"'$  (1.727 m), weight 72.6 kg, SpO2 100 %.  Physical Exam General appearance: Critically ill-appearing,  HEENT: NIPPV mask in place Pulmonary: Bilateral coarse but decreased breath sounds at bases, NIPPV Cardiac: Irregular rhythm Abdomen: Soft, nontender, nondistended Extremities: No peripheral edema Neuro: Awakens to sound,   Lab Results Lab Results  Component Value Date   WBC 16.8 (H) 08/10/2022   HGB 10.0 (L) 08/10/2022   HCT 30.6 (L) 08/10/2022   MCV 90.5 08/10/2022   PLT 173 08/10/2022    Lab Results  Component Value Date   CREATININE 2.33 (H) 08/10/2022   BUN 41 (H) 08/10/2022   NA 139 08/10/2022   K 4.2 08/10/2022   CL 100 08/10/2022   CO2 31 08/10/2022    Lab Results  Component Value Date   ALT 81 (H) 08/10/2022   AST 169 (H) 08/10/2022   GGT 48 08/09/2022   ALKPHOS 80 08/10/2022   BILITOT 0.5 08/10/2022     Microbiology: Recent Results (from the past 240 hour(s))  Blood Culture (routine x 2)     Status: None (Preliminary result)   Collection Time: 08/08/22  2:10 AM   Specimen: BLOOD RIGHT ARM  Result Value Ref Range Status   Specimen Description BLOOD RIGHT ARM  Final   Special Requests   Final    BOTTLES DRAWN AEROBIC AND ANAEROBIC Blood Culture adequate volume   Culture   Final    NO GROWTH 2 DAYS Performed at [facility], [address], [place], [place] [address]    Report Status PENDING  Incomplete  Blood Culture (routine x 2)     Status: None (Preliminary result)   Collection Time: 08/08/22  2:10 AM   Specimen: BLOOD  Result Value Ref Range Status   Specimen Description BLOOD BLOOD LEFT ARM  Final   Special Requests   Final    BOTTLES DRAWN AEROBIC AND ANAEROBIC Blood Culture adequate volume   Culture   Final    NO GROWTH 2 DAYS Performed at [facility], [address] [place], [place] [address]    Report Status PENDING  Incomplete  Urine Culture     Status: None   Collection Time: 08/08/22  2:10 AM   Specimen: In/Out Cath Urine  Result Value Ref Range Status   Specimen Description   Final    IN/OUT CATH URINE Performed at [facility], [address] [place], [place] [address]    Special Requests   Final    NONE Performed at [facility], [address] [place], [place] [address]    Culture   Final    NO GROWTH Performed at [facility], [place] [address] [place], [place] [address]    Report Status 08/09/2022 FINAL  Final  SARS Coronavirus 2 by RT PCR (hospital order, performed in [facility] hospital lab) *cepheid single result test* Anterior Nasal Swab     Status: None   Collection Time: 08/08/22  5:10 AM   Specimen: Anterior Nasal Swab  Result Value Ref Range Status   SARS Coronavirus 2 by RT PCR NEGATIVE NEGATIVE Final    Comment: (NOTE) SARS-CoV-2 target nucleic acids are NOT DETECTED.  The SARS-CoV-2 RNA is generally detectable in upper and lower respiratory specimens during the acute phase of infection. The lowest concentration of SARS-CoV-2 viral copies this assay can detect is 250 copies / mL. A negative result does not preclude SARS-CoV-2 infection and should not be used as the sole basis for treatment or other patient management decisions.  A negative result may occur with improper specimen collection / handling, submission of specimen  other than nasopharyngeal swab, presence of viral mutation(s) within the areas targeted by this assay, and inadequate number of viral copies (<250 copies / mL). A negative result must be combined with clinical observations, patient history, and epidemiological information.  Fact Sheet for Patients:   https://www.patel.info/  Fact Sheet for Healthcare Providers: https://hall.com/  This test is not yet approved or  cleared by the [place] FDA and has been authorized for detection and/or diagnosis of SARS-CoV-2 by FDA under an Emergency Use Authorization (EUA).  This EUA will remain in effect (meaning this test can be used) for the duration of the COVID-19 declaration under Section 564(b)(1)  of the Act, 21 U.S.C. section 360bbb-3(b)(1), unless the authorization is terminated or revoked sooner.  Performed at [facility], [address] [place], [place] [address]   MRSA Next Gen by PCR, Nasal     Status: None   Collection Time: 08/08/22  5:10 AM   Specimen: Anterior Nasal Swab  Result Value Ref Range Status   MRSA by PCR Next Gen NOT DETECTED NOT DETECTED Final    Comment: (NOTE) The GeneXpert MRSA Assay (FDA approved for NASAL specimens only), is one component of a comprehensive MRSA colonization surveillance program. It is not intended to diagnose MRSA infection nor to guide or monitor treatment for MRSA infections. Test performance is not FDA approved in patients less than 65 years old. Performed at [facility], [address] [place], [place] [address]   Culture, Respiratory w Gram Stain     Status: None (Preliminary result)   Collection Time: 08/09/22  7:54 AM   Specimen: Tracheal Aspirate; Respiratory  Result Value Ref Range Status   Specimen Description   Final    TRACHEAL ASPIRATE Performed at [facility], [address], [place], [place] [address]    Special Requests   Final     NONE Performed at [facility], [address] [place], [place] [address]    Gram Stain   Final    FEW WBC PRESENT,BOTH PMN AND MONONUCLEAR RARE GRAM POSITIVE COCCI IN CLUSTERS RARE BUDDING YEAST SEEN    Culture   Final    CULTURE REINCUBATED FOR BETTER GROWTH Performed at [facility], [place] [address] [place], [place] [address]    Report Status PENDING  Incomplete  Respiratory (~20 pathogens) panel by PCR     Status: None   Collection Time: 08/09/22  7:54 AM   Specimen: Nasopharyngeal Swab; Respiratory  Result Value Ref Range Status   Adenovirus NOT DETECTED NOT DETECTED Final   Coronavirus 229E NOT DETECTED NOT DETECTED Final    Comment: (NOTE) The Coronavirus on the Respiratory Panel, DOES NOT test for the novel  Coronavirus (2019 nCoV)    Coronavirus HKU1 NOT DETECTED NOT DETECTED Final   Coronavirus NL63 NOT DETECTED NOT DETECTED Final   Coronavirus OC43 NOT DETECTED NOT DETECTED Final   Metapneumovirus NOT DETECTED NOT DETECTED Final   Rhinovirus / Enterovirus NOT DETECTED NOT DETECTED Final   Influenza A NOT DETECTED NOT DETECTED Final   Influenza B NOT DETECTED NOT DETECTED Final   Parainfluenza Virus 1 NOT DETECTED NOT DETECTED Final   Parainfluenza Virus 2 NOT DETECTED NOT DETECTED Final   Parainfluenza Virus 3 NOT DETECTED NOT DETECTED Final   Parainfluenza Virus 4 NOT DETECTED NOT DETECTED Final   Respiratory Syncytial Virus NOT DETECTED NOT DETECTED Final   Bordetella pertussis NOT DETECTED NOT DETECTED Final   Bordetella Parapertussis NOT DETECTED NOT DETECTED Final   Chlamydophila pneumoniae NOT DETECTED NOT DETECTED Final   Mycoplasma pneumoniae NOT DETECTED NOT DETECTED Final    Comment: Performed at [facility], [place]. [address], [place], [place] [address]    Medications: Scheduled Meds:  aspirin  81 mg Per Tube Daily   Chlorhexidine Gluconate Cloth  6 each Topical Daily   dextrose  1 ampule Intravenous Once   insulin aspart  0-9  Units Subcutaneous Q4H   insulin glargine-yfgn  8 Units Subcutaneous Daily   mouth rinse  15 mL Mouth Rinse Q2H   pantoprazole (PROTONIX) IV  40 mg Intravenous QHS   Continuous  Infusions:  ceFEPime (MAXIPIME) IV Stopped (08/09/22 2231)   heparin 700 Units/hr (08/10/22 1500)   metronidazole Stopped (08/10/22 1332)   norepinephrine (LEVOPHED) Adult infusion Stopped (08/10/22 1236)   PRN Meds:.dextrose, mouth rinse, polyethylene glycol  Allergies  Allergen Reactions   Shellfish Allergy Anaphylaxis    Throat swelling   Flexeril [Cyclobenzaprine] Hypertension    Urinalysis: Recent Labs    08/08/22 0210  COLORURINE STRAW*  LABSPEC 1.021  PHURINE 5.0  GLUCOSEU >=500*  HGBUR NEGATIVE  BILIRUBINUR NEGATIVE  KETONESUR 20*  PROTEINUR NEGATIVE  NITRITE NEGATIVE  LEUKOCYTESUR NEGATIVE      Imaging: DG Chest Port 1 View  Result Date: 08/10/2022 CLINICAL DATA:  81 year old female with respiratory failure and hypoxia. Sepsis. Intubated. EXAM: PORTABLE CHEST 1 VIEW COMPARISON:  CTA chest 08/08/2022 and earlier. FINDINGS: Portable AP semi upright view at 0419 hours. The patient is more rotated to the right. Endotracheal tube tip is stable at the level the clavicles. Left subclavian vascular catheter is stable. Enteric tube courses to the abdomen, tip not included. Mildly lower lung volumes. Prior CABG. Stable cardiac size and mediastinal contours. No pneumothorax, pleural effusion or consolidation. Mildly increased crowding of markings or vascular congestion. No overt edema. Stable visualized osseous structures. Prior sternotomy. Negative visible bowel gas. IMPRESSION: 1. Stable lines and tubes. 2. Mildly lower lung volumes with increased crowding of markings or vascular congestion. But no overt edema. Electronically Signed   By: Genevie Ann M.D.   On: 08/10/2022 04:56   US Abdomen Limited RUQ (LIVER/GB)  Result Date: 08/09/2022 CLINICAL DATA:  Elevated LFTs EXAM: ULTRASOUND ABDOMEN LIMITED  RIGHT UPPER QUADRANT COMPARISON:  CT abdomen and pelvis 08/08/2022 FINDINGS: Gallbladder: Normally distended with small shadowing calculi up to 5 mm diameter. No definite gallbladder wall thickening or sonographic Murphy sign. Free fluid adjacent to gallbladder. Common bile duct: Diameter: 2 mm Liver: Echogenic parenchyma, likely fatty infiltration though this can be seen with cirrhosis and certain infiltrative disorders. No hepatic mass or nodularity. Portal vein is patent on color Doppler imaging with normal direction of blood flow towards the liver. Other: N/A IMPRESSION: Cholelithiasis. Free fluid adjacent to gallbladder without definite evidence of acute cholecystitis. Probable fatty infiltration of liver as above. Electronically Signed   By: Lavonia Dana M.D.   On: 08/09/2022 11:31      Assessment/Plan:  Carrie Kane is a 81 y.o. female with medical problems of type 2 diabetes, coronary disease with history of CABG, peripheral arterial disease, anemia, hypothyroidism, hypertension, hyperlipidemia, history of stroke in May 2023, history of femoral neck fracture   was admitted on 08/08/2022 for :  Dehydration [E86.0] NSTEMI (non-ST elevated myocardial infarction) ([place]) [I21.4] AKI (acute kidney injury) ([place]) [N17.9] Altered mental status, unspecified altered mental status type [R41.82] Diabetic ketoacidosis without coma associated with type 2 diabetes mellitus ([place]) [V69.79] Acute metabolic encephalopathy [Y80.16]  #Acute kidney injury- secondary to hemodynamic instability and hypotension causing ATN. Baseline creatinine of 0.75 from 07/15/2022 which has increased to 2.33 today.  Urine output of 445 cc so far today.  Required pressors earlier but off of it when seen. Plan: -Electrolytes and volume status are acceptable.  No acute indication for dialysis at present. -Avoid nonsteroidals, aminoglycosides, IV contrast -May use one-time dose of diuretic as needed -Management of acute respiratory  failure as per ICU team.  Currently requiring BiPAP. -We will follow closely.   Carrie Kane Candiss Norse 08/10/22

## 2022-08-11 DIAGNOSIS — G9341 Metabolic encephalopathy: Secondary | ICD-10-CM | POA: Diagnosis not present

## 2022-08-11 DIAGNOSIS — I2510 Atherosclerotic heart disease of native coronary artery without angina pectoris: Secondary | ICD-10-CM | POA: Diagnosis not present

## 2022-08-11 DIAGNOSIS — I480 Paroxysmal atrial fibrillation: Secondary | ICD-10-CM | POA: Diagnosis not present

## 2022-08-11 DIAGNOSIS — I214 Non-ST elevation (NSTEMI) myocardial infarction: Secondary | ICD-10-CM | POA: Diagnosis not present

## 2022-08-11 LAB — BLOOD GAS, ARTERIAL
Acid-Base Excess: 10 mmol/L — ABNORMAL HIGH (ref 0.0–2.0)
Bicarbonate: 33.3 mmol/L — ABNORMAL HIGH (ref 20.0–28.0)
FIO2: 28 %
MECHVT: 400 mL
O2 Saturation: 99.9 %
PEEP: 5 cmH2O
Patient temperature: 37
RATE: 16 resp/min
pCO2 arterial: 39 mmHg (ref 32–48)
pH, Arterial: 7.54 — ABNORMAL HIGH (ref 7.35–7.45)
pO2, Arterial: 121 mmHg — ABNORMAL HIGH (ref 83–108)

## 2022-08-11 LAB — CULTURE, RESPIRATORY W GRAM STAIN

## 2022-08-11 LAB — COMPREHENSIVE METABOLIC PANEL
ALT: 68 U/L — ABNORMAL HIGH (ref 0–44)
AST: 124 U/L — ABNORMAL HIGH (ref 15–41)
Albumin: 2.4 g/dL — ABNORMAL LOW (ref 3.5–5.0)
Alkaline Phosphatase: 81 U/L (ref 38–126)
Anion gap: 8 (ref 5–15)
BUN: 37 mg/dL — ABNORMAL HIGH (ref 8–23)
CO2: 32 mmol/L (ref 22–32)
Calcium: 8.5 mg/dL — ABNORMAL LOW (ref 8.9–10.3)
Chloride: 102 mmol/L (ref 98–111)
Creatinine, Ser: 1.56 mg/dL — ABNORMAL HIGH (ref 0.44–1.00)
GFR, Estimated: 34 mL/min — ABNORMAL LOW (ref >60)
Glucose, Bld: 104 mg/dL — ABNORMAL HIGH (ref 70–99)
Potassium: 3.5 mmol/L (ref 3.5–5.1)
Sodium: 142 mmol/L (ref 135–145)
Total Bilirubin: 0.8 mg/dL (ref 0.3–1.2)
Total Protein: 4.9 g/dL — ABNORMAL LOW (ref 6.5–8.1)

## 2022-08-11 LAB — GLUCOSE, CAPILLARY
Glucose-Capillary: 113 mg/dL — ABNORMAL HIGH (ref 70–99)
Glucose-Capillary: 81 mg/dL (ref 70–99)
Glucose-Capillary: 91 mg/dL (ref 70–99)
Glucose-Capillary: 95 mg/dL (ref 70–99)
Glucose-Capillary: 96 mg/dL (ref 70–99)
Glucose-Capillary: 98 mg/dL (ref 70–99)

## 2022-08-11 LAB — CBC
HCT: 28.2 % — ABNORMAL LOW (ref 36.0–46.0)
Hemoglobin: 9.1 g/dL — ABNORMAL LOW (ref 12.0–15.0)
MCH: 29.7 pg (ref 26.0–34.0)
MCHC: 32.3 g/dL (ref 30.0–36.0)
MCV: 92.2 fL (ref 80.0–100.0)
Platelets: 131 10*3/uL — ABNORMAL LOW (ref 150–400)
RBC: 3.06 MIL/uL — ABNORMAL LOW (ref 3.87–5.11)
RDW: 14.9 % (ref 11.5–15.5)
WBC: 8.8 10*3/uL (ref 4.0–10.5)
nRBC: 0 % (ref 0.0–0.2)

## 2022-08-11 LAB — HEPARIN LEVEL (UNFRACTIONATED): Heparin Unfractionated: 0.34 IU/mL (ref 0.30–0.70)

## 2022-08-11 LAB — PHOSPHORUS: Phosphorus: 2.9 mg/dL (ref 2.5–4.6)

## 2022-08-11 LAB — MAGNESIUM: Magnesium: 1.9 mg/dL (ref 1.7–2.4)

## 2022-08-11 MED ORDER — ONDANSETRON HCL 4 MG/2ML IJ SOLN
4.0000 mg | Freq: Three times a day (TID) | INTRAMUSCULAR | Status: DC | PRN
  Administered 2022-08-11: 4 mg via INTRAVENOUS
  Filled 2022-08-11: qty 2

## 2022-08-11 MED ORDER — ORAL CARE MOUTH RINSE
15.0000 mL | Freq: Four times a day (QID) | OROMUCOSAL | Status: DC
  Administered 2022-08-12 – 2022-08-16 (×14): 15 mL via OROMUCOSAL

## 2022-08-11 NOTE — Progress Notes (Signed)
[place], [place] 08/11/22  Subjective:   Hospital day # 3  Patient is doing better today.  Not on BiPAP anymore.  Serum creatinine has improved to 1.56 as noted below. Renal: 10/24 0701 - 10/25 0700 In: 751.4 [I.V.:250.4; IV Piggyback:501] Out: 1941 [Urine:1145] Lab Results  Component Value Date   CREATININE 1.56 (H) 08/11/2022   CREATININE 2.33 (H) 08/10/2022   CREATININE 1.93 (H) 08/09/2022     Objective:  Vital signs in last 24 hours:  Temp:  [97.3 F (36.3 C)-97.8 F (36.6 C)] 97.8 F (36.6 C) (10/25 1200) Pulse Rate:  [64-84] 67 (10/25 1200) Resp:  [10-23] 14 (10/25 1200) BP: (121-153)/(42-85) 151/71 (10/25 1200) SpO2:  [86 %-100 %] 93 % (10/25 1200) Weight:  [71.9 kg] 71.9 kg (10/25 0431)  Weight change: -0.7 kg Filed Weights   08/08/22 0200 08/10/22 0447 08/11/22 0431  Weight: 63.5 kg 72.6 kg 71.9 kg    Intake/Output:    Intake/Output Summary (Last 24 hours) at 08/11/2022 1610 Last data filed at 08/11/2022 0900 Gross per 24 hour  Intake 418.87 ml  Output 850 ml  Net -431.13 ml    Physical Exam: General:  No acute distress, laying in the bed  HEENT  anicteric, moist oral mucous membrane  Pulm/lungs  normal breathing effort, mild coarse breath sounds  CVS/Heart  regular rhythm, no rub or gallop  Abdomen:   Soft, nontender  Extremities:  No peripheral edema  Neurologic:  Alert, oriented, able to follow commands  Skin:  No acute rashes     Basic Metabolic Panel:  Recent Labs  Lab 08/08/22 0513 08/08/22 1047 08/08/22 1439 08/08/22 2012 08/09/22 0526 08/10/22 0420 08/11/22 0420  NA  --    < > 139 136 140 139 142  K  --    < > 2.7* 3.6 3.2* 4.2 3.5  CL  --    < > 96* 97* 97* 100 102  CO2  --    < > 27 30 33* 31 32  GLUCOSE  --    < > 280* 119* 157* 142* 104*  BUN  --    < > 34* 33* 35* 41* 37*  CREATININE  --    < > 1.79* 1.68* 1.93* 2.33* 1.56*  CALCIUM  --    < > 8.9 8.4* 8.3* 8.0* 8.5*  MG 2.1  --   --    --   --  1.7 1.9  PHOS 8.1*  --   --   --   --  3.3 2.9   < > = values in this interval not displayed.     CBC: Recent Labs  Lab 08/08/22 0210 08/08/22 0339 08/09/22 0526 08/10/22 0420 08/11/22 0420  WBC 19.0* 21.1* 19.2* 16.8* 8.8  NEUTROABS 14.1* 16.0*  --   --   --   HGB 9.8* 8.9* 9.6* 10.0* 9.1*  HCT 34.8* 30.2* 28.6* 30.6* 28.2*  MCV 105.8* 99.3 89.9 90.5 92.2  PLT 214 195 188 173 131*     No results found for: "HEPBSAG", "HEPBSAB", "HEPBIGM"    Microbiology:  Recent Results (from the past 240 hour(s))  Blood Culture (routine x 2)     Status: None (Preliminary result)   Collection Time: 08/08/22  2:10 AM   Specimen: BLOOD RIGHT ARM  Result Value Ref Range Status   Specimen Description BLOOD RIGHT ARM  Final   Special Requests   Final    BOTTLES DRAWN AEROBIC AND ANAEROBIC Blood Culture adequate  volume   Culture   Final    NO GROWTH 3 DAYS Performed at [facility], [address] [place], [place] [address]    Report Status PENDING  Incomplete  Blood Culture (routine x 2)     Status: None (Preliminary result)   Collection Time: 08/08/22  2:10 AM   Specimen: BLOOD  Result Value Ref Range Status   Specimen Description BLOOD BLOOD LEFT ARM  Final   Special Requests   Final    BOTTLES DRAWN AEROBIC AND ANAEROBIC Blood Culture adequate volume   Culture   Final    NO GROWTH 3 DAYS Performed at [facility], [address] [place], [place] [address]    Report Status PENDING  Incomplete  Urine Culture     Status: None   Collection Time: 08/08/22  2:10 AM   Specimen: In/Out Cath Urine  Result Value Ref Range Status   Specimen Description   Final    IN/OUT CATH URINE Performed at [facility], [address] [place], [place] [address]    Special Requests   Final    NONE Performed at [facility], [address], [place], [place] [address]    Culture   Final    NO GROWTH Performed at [facility], [place] [address], [place], [place] [address]    Report Status 08/09/2022 FINAL  Final  SARS Coronavirus 2 by RT PCR (hospital order, performed in [facility] hospital lab) *cepheid single result test* Anterior Nasal Swab     Status: None   Collection Time: 08/08/22  5:10 AM   Specimen: Anterior Nasal Swab  Result Value Ref Range Status   SARS Coronavirus 2 by RT PCR NEGATIVE NEGATIVE Final    Comment: (NOTE) SARS-CoV-2 target nucleic acids are NOT DETECTED.  The SARS-CoV-2 RNA is generally detectable in upper and lower respiratory specimens during the acute phase of infection. The lowest concentration of SARS-CoV-2 viral copies this assay can detect is 250 copies / mL. A negative result does not preclude SARS-CoV-2 infection and should not be used as the sole basis for treatment or other patient management decisions.  A negative result may occur with improper specimen collection / handling, submission of specimen other than nasopharyngeal swab, presence of viral mutation(s) within the areas targeted by this assay, and inadequate number of viral copies (<250 copies / mL). A negative result must be combined with clinical observations, patient history, and epidemiological information.  Fact Sheet for Patients:   https://www.patel.info/  Fact Sheet for Healthcare Providers: https://hall.com/  This test is not yet approved or  cleared by the [place] FDA and has been authorized for detection and/or diagnosis of SARS-CoV-2 by FDA under an Emergency Use Authorization (EUA).  This EUA will remain in effect (meaning this test can be used) for the duration of the COVID-19 declaration under Section 564(b)(1) of the Act, 21 U.S.C. section 360bbb-3(b)(1), unless the authorization is terminated or revoked sooner.  Performed at [facility], [address] [place],  [address]   MRSA Next Gen by PCR, Nasal     Status: None   Collection  Time: 08/08/22  5:10 AM   Specimen: Anterior Nasal Swab  Result Value Ref Range Status   MRSA by PCR Next Gen NOT DETECTED NOT DETECTED Final    Comment: (NOTE) The GeneXpert MRSA Assay (FDA approved for NASAL specimens only), is one component of a comprehensive MRSA colonization surveillance program. It is not intended  to diagnose MRSA infection nor to guide or monitor treatment for MRSA infections. Test performance is not FDA approved in patients less than 78 years old. Performed at [facility], [address] [place], [place] [address]   Culture, Respiratory w Gram Stain     Status: None   Collection Time: 08/09/22  7:54 AM   Specimen: Tracheal Aspirate; Respiratory  Result Value Ref Range Status   Specimen Description   Final    TRACHEAL ASPIRATE Performed at [facility], [address] [place], [place] [address]    Special Requests   Final    NONE Performed at [facility], [address] [place], [place] [address]    Gram Stain   Final    FEW WBC PRESENT,BOTH PMN AND MONONUCLEAR RARE GRAM POSITIVE COCCI IN CLUSTERS RARE BUDDING YEAST SEEN Performed at [facility], [place] [address] [place], [place] [address]    Culture FEW CANDIDA TROPICALIS  Final   Report Status 08/11/2022 FINAL  Final  Respiratory (~20 pathogens) panel by PCR     Status: None   Collection Time: 08/09/22  7:54 AM   Specimen: Nasopharyngeal Swab; Respiratory  Result Value Ref Range Status   Adenovirus NOT DETECTED NOT DETECTED Final   Coronavirus 229E NOT DETECTED NOT DETECTED Final    Comment: (NOTE) The Coronavirus on the Respiratory Panel, DOES NOT test for the novel  Coronavirus (2019 nCoV)    Coronavirus HKU1 NOT DETECTED NOT DETECTED Final   Coronavirus NL63 NOT DETECTED NOT DETECTED Final   Coronavirus OC43 NOT DETECTED NOT DETECTED Final   Metapneumovirus NOT DETECTED NOT DETECTED Final   Rhinovirus / Enterovirus NOT DETECTED NOT DETECTED Final    Influenza A NOT DETECTED NOT DETECTED Final   Influenza B NOT DETECTED NOT DETECTED Final   Parainfluenza Virus 1 NOT DETECTED NOT DETECTED Final   Parainfluenza Virus 2 NOT DETECTED NOT DETECTED Final   Parainfluenza Virus 3 NOT DETECTED NOT DETECTED Final   Parainfluenza Virus 4 NOT DETECTED NOT DETECTED Final   Respiratory Syncytial Virus NOT DETECTED NOT DETECTED Final   Bordetella pertussis NOT DETECTED NOT DETECTED Final   Bordetella Parapertussis NOT DETECTED NOT DETECTED Final   Chlamydophila pneumoniae NOT DETECTED NOT DETECTED Final   Mycoplasma pneumoniae NOT DETECTED NOT DETECTED Final    Comment: Performed at [facility], [place], [place],  [address]    Coagulation Studies: Recent Labs    08/09/22 0745  LABPROT 15.5*  INR 1.2    Urinalysis: No results for input(s): "COLORURINE", "LABSPEC", "PHURINE", "GLUCOSEU", "HGBUR", "BILIRUBINUR", "KETONESUR", "PROTEINUR", "UROBILINOGEN", "NITRITE", "LEUKOCYTESUR" in the last 72 hours.  Invalid input(s): "APPERANCEUR"    Imaging: DG Chest Port 1 View  Result Date: 08/10/2022 CLINICAL DATA:  81 year old female with respiratory failure and hypoxia. Sepsis. Intubated. EXAM: PORTABLE CHEST 1 VIEW COMPARISON:  CTA chest 08/08/2022 and earlier. FINDINGS: Portable AP semi upright view at 0419 hours. The patient is more rotated to the right. Endotracheal tube tip is stable at the level the clavicles. Left subclavian vascular catheter is stable. Enteric tube courses to the abdomen, tip not included. Mildly lower lung volumes. Prior CABG. Stable cardiac size and mediastinal contours. No pneumothorax, pleural effusion or consolidation. Mildly increased crowding of markings or vascular congestion. No overt edema. Stable visualized osseous structures. Prior sternotomy. Negative visible bowel gas. IMPRESSION: 1. Stable lines and tubes. 2. Mildly lower lung volumes with increased crowding of markings or vascular congestion. But  no overt edema. Electronically Signed   By: Genevie Ann M.D.   On: 08/10/2022 04:56     Medications:    ceFEPime (MAXIPIME) IV Stopped (08/10/22 2229)   heparin 700 Units/hr (08/11/22 0900)   metronidazole 500 mg (08/11/22 1217)    aspirin  81 mg Per Tube Daily   Chlorhexidine Gluconate Cloth  6 each Topical Daily   dextrose  1 ampule Intravenous Once   insulin aspart  0-9 Units Subcutaneous Q4H   insulin glargine-yfgn  8 Units Subcutaneous Daily   mouth rinse  15 mL Mouth Rinse Q2H   pantoprazole (PROTONIX) IV  40 mg Intravenous QHS   dextrose, ondansetron (ZOFRAN) IV, mouth rinse, polyethylene glycol  Assessment/ Plan:  81 y.o. female with   medical problems of type 2 diabetes, coronary disease with history of CABG, peripheral arterial disease, anemia, hypothyroidism, hypertension, hyperlipidemia, history of stroke in May 2023, history of femoral neck fracture  admitted on 08/08/2022 for Dehydration [E86.0] NSTEMI (non-ST elevated myocardial infarction) ([place]) [I21.4] AKI (acute kidney injury) ([place]) [N17.9] Altered mental status, unspecified altered mental status type [R41.82] Diabetic ketoacidosis without coma associated with type 2 diabetes mellitus ([place]) [U13.24] Acute metabolic encephalopathy [M01.02]   #Acute kidney injury- secondary to hemodynamic instability and hypotension causing ATN. Baseline creatinine of 0.75 from 07/15/2022 which is at 2.23 but has improved to 1.56 today.  Off pressors now. Plan: -Electrolytes and volume status are acceptable.   -Serum creatinine is expected to continue to recover. We will sign off.  Please reconsult as necessary.   LOS: [place] 10/25/20234:10 PM  [place], [place]  Note: This note was prepared with Dragon dictation. Any transcription errors are unintentional

## 2022-08-11 NOTE — Progress Notes (Signed)
PROGRESS NOTE    Carrie Kane  KDT:[id number] DOB: [birth year] DOA: 08/08/2022 PCP: Cletis [place], MD    Brief Narrative:  This 81 years old female with PMH significant of left displaced femoral neck fracture, T2DM, CAD with Hx of CABG X 3, PAD s/p revascularization in 2022, PSVT, Anemia, Hypothyroidism, hypertension, HLD, CVA (02/2022) who presented to the ED with chief complaints of altered mental status and shortness of breath. She was noted to be hypotensive, have severe metabolic acidosis and hyperglycemia.  He was admitted in the ICU for DKA and managed as per protocol.  She was also found to have elevated troponin cardiology was consulted, recommended left heart cath once renal functions improved.  She is off pressor support.  CT shows small PE.  She was intubated and eventually successfully extubated. TRH pickup 08/11/2022.  Assessment & Plan:   Principal Problem:   Acute metabolic encephalopathy Active Problems:   AKI (acute kidney injury) ([facility])   Altered mental status   NSTEMI (non-ST elevated myocardial infarction) ([facility])   Diabetic ketoacidosis without coma associated with type 2 diabetes mellitus ([facility])   Sepsis with acute renal failure and septic shock ([facility])   PAF (paroxysmal atrial fibrillation) ([facility])  Acute metabolic encephalopathy likely multifactorial: Patient presented with DKA, shock and possible infection. Mental status is improving.  DKA resolved.  Acute hypoxic respiratory failure requiring mechanical ventilation: She is successfully extubated.   Continue supplemental oxygen and wean as tolerated.  NSTEMI: Patient had history of CAD with multiple PCI and CABG Cardiology is on the board , recommended to continue heparin. Patient denies any chest pain.  Continue aspirin,  Lipitor. Echocardiogram shows LVEF 65 to 70%. Plan for left heart cath once renal functions improve and critical illness resolves.   New onset A-fib: Heart rate is controlled, now in sinus  rhythm Continue heparin, plan for NOAC on discharge   Acute kidney injury in the setting of hypotension and DKA. Nephrology is following.  Serum creatinine improving. Avoid nephrotoxic medications No acute indication for dialysis at present.  Diabetic ketoacidosis: Resolved.  Continue sliding scale.  Normochromic normocytic anemia without blood loss H&H remains stable.   Monitor H&H while on heparin.  Shock: Undifferentiated. > Resolved. Could be combination of sepsis, hypovolemia in the setting of DKA She required pressor support.  Subsequently discontinued. CT abdomen pelvis shows pericholecystic fluid Ultrasound abdomen negative for acute cholecystitis General surgery is consulted, if patient declines consider HIDA scan. Patient is improving with medical management. Continue cefepime and Flagyl.  General surgery signed off    DVT prophylaxis: Heparin Code Status: Full code Family Communication: No family at bedside Disposition Plan:   Status is: Inpatient Remains inpatient appropriate because: Admitted for multiple active issues which are recovering. Patient needs left heart cath given non-STEMI pending normalization of serum creatinine.   Consultants:  Cardiology Nephrology PCCM  Procedures: None Antimicrobials: Anti-infectives (From admission, onward)    Start     Dose/Rate Route Frequency Ordered Stop   08/08/22 2200  ceFEPIme (MAXIPIME) 2 g in sodium chloride 0.9 % 100 mL IVPB        2 g 200 mL/hr over 30 Minutes Intravenous Every 24 hours 08/08/22 1135     08/08/22 1200  metroNIDAZOLE (FLAGYL) IVPB 500 mg        500 mg 100 mL/hr over 60 Minutes Intravenous Every 8 hours 08/08/22 1105     08/08/22 0745  ceFEPIme (MAXIPIME) 2 g in sodium chloride 0.9 % 100 mL IVPB  Status:  Discontinued        2 g 200 mL/hr over 30 Minutes Intravenous  Once 08/08/22 0649 08/08/22 0816   08/08/22 0745  vancomycin (VANCOREADY) IVPB 1500 mg/300 mL  Status:  Discontinued         1,500 mg 150 mL/hr over 120 Minutes Intravenous  Once 08/08/22 0649 08/08/22 0816   08/08/22 0300  ceFEPIme (MAXIPIME) 2 g in sodium chloride 0.9 % 100 mL IVPB        2 g 200 mL/hr over 30 Minutes Intravenous  Once 08/08/22 0249 08/08/22 0326   08/08/22 0300  vancomycin (VANCOREADY) IVPB 1500 mg/300 mL        1,500 mg 150 mL/hr over 120 Minutes Intravenous  Once 08/08/22 0249 08/08/22 0160       Subjective: Patient was seen and examined at bedside.  Overnight events noted. Patient appears very deconditioned but reports feeling better.  Objective: Vitals:   08/11/22 0918 08/11/22 1000 08/11/22 1100 08/11/22 1200  BP: (!) 153/66 (!) 144/63 (!) 138/42 (!) 151/71  Pulse: 74 69 73 67  Resp: '19 13 14 14  '$ Temp:    97.8 F (36.6 C)  TempSrc:    Oral  SpO2: 98% 98% 92% 93%  Weight:      Height:        Intake/Output Summary (Last 24 hours) at 08/11/2022 1429 Last data filed at 08/11/2022 0900 Gross per 24 hour  Intake 432.86 ml  Output 1000 ml  Net -567.14 ml   Filed Weights   08/08/22 0200 08/10/22 0447 08/11/22 0431  Weight: 63.5 kg 72.6 kg 71.9 kg    Examination:  General exam: Appears comfortable, not in any acute distress, very deconditioned. Respiratory system: CTA bilaterally, respiratory effort normal, RR 15 Cardiovascular system: S1 & S2 heard, regular rate and rhythm, no murmur Gastrointestinal system: Abdomen is soft, non tender, non distended, BS+ Central nervous system: Alert and oriented x 3. No focal neurological deficits. Extremities: No edema, no cyanosis, no clubbing Skin: No rashes, lesions or ulcers Psychiatry: Judgement and insight appear normal. Mood & affect appropriate.     Data Reviewed: I have personally reviewed following labs and imaging studies  CBC: Recent Labs  Lab 08/08/22 0210 08/08/22 0339 08/09/22 0526 08/10/22 0420 08/11/22 0420  WBC 19.0* 21.1* 19.2* 16.8* 8.8  NEUTROABS 14.1* 16.0*  --   --   --   HGB 9.8* 8.9* 9.6* 10.0*  9.1*  HCT 34.8* 30.2* 28.6* 30.6* 28.2*  MCV 105.8* 99.3 89.9 90.5 92.2  PLT 214 195 188 173 109*   Basic Metabolic Panel: Recent Labs  Lab 08/08/22 0513 08/08/22 1047 08/08/22 1439 08/08/22 2012 08/09/22 0526 08/10/22 0420 08/11/22 0420  NA  --    < > 139 136 140 139 142  K  --    < > 2.7* 3.6 3.2* 4.2 3.5  CL  --    < > 96* 97* 97* 100 102  CO2  --    < > 27 30 33* 31 32  GLUCOSE  --    < > 280* 119* 157* 142* 104*  BUN  --    < > 34* 33* 35* 41* 37*  CREATININE  --    < > 1.79* 1.68* 1.93* 2.33* 1.56*  CALCIUM  --    < > 8.9 8.4* 8.3* 8.0* 8.5*  MG 2.1  --   --   --   --  1.7 1.9  PHOS 8.1*  --   --   --   --  3.3 2.9   < > = values in this interval not displayed.   GFR: Estimated Creatinine Clearance: 29.5 mL/min (A) (by C-G formula based on SCr of 1.56 mg/dL (H)). Liver Function Tests: Recent Labs  Lab 08/08/22 0210 08/09/22 0747 08/10/22 0420 08/11/22 0420  AST 105* 267* 169* 124*  ALT 38 92* 81* 68*  ALKPHOS 88 70 80 81  BILITOT 1.8* 0.5 0.5 0.8  PROT 6.6 5.0* 5.2* 4.9*  ALBUMIN 3.9 2.7* 2.6* 2.4*   No results for input(s): "LIPASE", "AMYLASE" in the last 168 hours. No results for input(s): "AMMONIA" in the last 168 hours. Coagulation Profile: Recent Labs  Lab 08/08/22 0210 08/09/22 0745  INR 1.2 1.2   Cardiac Enzymes: No results for input(s): "CKTOTAL", "CKMB", "CKMBINDEX", "TROPONINI" in the last 168 hours. BNP (last 3 results) No results for input(s): "PROBNP" in the last 8760 hours. HbA1C: No results for input(s): "HGBA1C" in the last 72 hours. CBG: Recent Labs  Lab 08/10/22 2002 08/11/22 0022 08/11/22 0416 08/11/22 0741 08/11/22 1207  GLUCAP 98 81 98 95 113*   Lipid Profile: Recent Labs    08/09/22 0526  TRIG 64   Thyroid Function Tests: No results for input(s): "TSH", "T4TOTAL", "FREET4", "T3FREE", "THYROIDAB" in the last 72 hours. Anemia Panel: No results for input(s): "VITAMINB12", "FOLATE", "FERRITIN", "TIBC", "IRON",  "RETICCTPCT" in the last 72 hours. Sepsis Labs: Recent Labs  Lab 08/08/22 0210 08/08/22 1025 08/09/22 0526 08/09/22 0745 08/10/22 0420  PROCALCITON 0.99  --  7.02  --  5.52  LATICACIDVEN >9.0* >9.0*  --  2.8*  --     Recent Results (from the past 240 hour(s))  Blood Culture (routine x 2)     Status: None (Preliminary result)   Collection Time: 08/08/22  2:10 AM   Specimen: BLOOD RIGHT ARM  Result Value Ref Range Status   Specimen Description BLOOD RIGHT ARM  Final   Special Requests   Final    BOTTLES DRAWN AEROBIC AND ANAEROBIC Blood Culture adequate volume   Culture   Final    NO GROWTH 3 DAYS Performed at [facility], [address], [place], [place] [address]    Report Status PENDING  Incomplete  Blood Culture (routine x 2)     Status: None (Preliminary result)   Collection Time: 08/08/22  2:10 AM   Specimen: BLOOD  Result Value Ref Range Status   Specimen Description BLOOD BLOOD LEFT ARM  Final   Special Requests   Final    BOTTLES DRAWN AEROBIC AND ANAEROBIC Blood Culture adequate volume   Culture   Final    NO GROWTH 3 DAYS Performed at [facility], [address] [place], [place] [address]    Report Status PENDING  Incomplete  Urine Culture     Status: None   Collection Time: 08/08/22  2:10 AM   Specimen: In/Out Cath Urine  Result Value Ref Range Status   Specimen Description   Final    IN/OUT CATH URINE Performed at [facility], [address] [place], [place] [address]    Special Requests   Final    NONE Performed at [facility], [address] [place], [place] [address]    Culture   Final    NO GROWTH Performed at [facility], [place] [address], [place], [place] [address]    Report Status 08/09/2022 FINAL  Final  SARS Coronavirus 2 by RT PCR (hospital order, performed in [facility] hospital lab) *cepheid  single result test* Anterior Nasal Swab     Status: None   Collection Time: 08/08/22  5:10 AM    Specimen: Anterior Nasal Swab  Result Value Ref Range Status   SARS Coronavirus 2 by RT PCR NEGATIVE NEGATIVE Final    Comment: (NOTE) SARS-CoV-2 target nucleic acids are NOT DETECTED.  The SARS-CoV-2 RNA is generally detectable in upper and lower respiratory specimens during the acute phase of infection. The lowest concentration of SARS-CoV-2 viral copies this assay can detect is 250 copies / mL. A negative result does not preclude SARS-CoV-2 infection and should not be used as the sole basis for treatment or other patient management decisions.  A negative result may occur with improper specimen collection / handling, submission of specimen other than nasopharyngeal swab, presence of viral mutation(s) within the areas targeted by this assay, and inadequate number of viral copies (<250 copies / mL). A negative result must be combined with clinical observations, patient history, and epidemiological information.  Fact Sheet for Patients:   https://www.patel.info/  Fact Sheet for Healthcare Providers: https://hall.com/  This test is not yet approved or  cleared by the [place] FDA and has been authorized for detection and/or diagnosis of SARS-CoV-2 by FDA under an Emergency Use Authorization (EUA).  This EUA will remain in effect (meaning this test can be used) for the duration of the COVID-19 declaration under Section 564(b)(1) of the Act, 21 U.S.C. section 360bbb-3(b)(1), unless the authorization is terminated or revoked sooner.  Performed at [facility], [address] [place], [place] [address]   MRSA Next Gen by PCR, Nasal     Status: None   Collection Time: 08/08/22  5:10 AM   Specimen: Anterior Nasal Swab  Result Value Ref Range Status   MRSA by PCR Next Gen NOT DETECTED NOT DETECTED Final    Comment: (NOTE) The GeneXpert MRSA Assay (FDA approved for NASAL specimens only), is one component of a comprehensive MRSA  colonization surveillance program. It is not intended to diagnose MRSA infection nor to guide or monitor treatment for MRSA infections. Test performance is not FDA approved in patients less than 36 years old. Performed at [facility], [address] [place], [place] [address]   Culture, Respiratory w Gram Stain     Status: None (Preliminary result)   Collection Time: 08/09/22  7:54 AM   Specimen: Tracheal Aspirate; Respiratory  Result Value Ref Range Status   Specimen Description   Final    TRACHEAL ASPIRATE Performed at [facility], [address], [place], [place] [address]    Special Requests   Final    NONE Performed at [facility], [address] [place], [place] [address]    Gram Stain   Final    FEW WBC PRESENT,BOTH PMN AND MONONUCLEAR RARE GRAM POSITIVE COCCI IN CLUSTERS RARE BUDDING YEAST SEEN    Culture   Final    CULTURE REINCUBATED FOR BETTER GROWTH Performed at [facility], [place] [address], [place], [place] [address]    Report Status PENDING  Incomplete  Respiratory (~20 pathogens) panel by PCR     Status: None   Collection Time: 08/09/22  7:54 AM   Specimen: Nasopharyngeal Swab; Respiratory  Result Value Ref Range Status   Adenovirus NOT DETECTED NOT DETECTED Final   Coronavirus 229E NOT DETECTED NOT DETECTED Final    Comment: (NOTE) The Coronavirus on the Respiratory Panel, DOES NOT test for the novel  Coronavirus (2019 nCoV)  Coronavirus HKU1 NOT DETECTED NOT DETECTED Final   Coronavirus NL63 NOT DETECTED NOT DETECTED Final   Coronavirus OC43 NOT DETECTED NOT DETECTED Final   Metapneumovirus NOT DETECTED NOT DETECTED Final   Rhinovirus / Enterovirus NOT DETECTED NOT DETECTED Final   Influenza A NOT DETECTED NOT DETECTED Final   Influenza B NOT DETECTED NOT DETECTED Final   Parainfluenza Virus 1 NOT DETECTED NOT DETECTED Final   Parainfluenza Virus 2 NOT DETECTED NOT DETECTED Final   Parainfluenza Virus 3 NOT  DETECTED NOT DETECTED Final   Parainfluenza Virus 4 NOT DETECTED NOT DETECTED Final   Respiratory Syncytial Virus NOT DETECTED NOT DETECTED Final   Bordetella pertussis NOT DETECTED NOT DETECTED Final   Bordetella Parapertussis NOT DETECTED NOT DETECTED Final   Chlamydophila pneumoniae NOT DETECTED NOT DETECTED Final   Mycoplasma pneumoniae NOT DETECTED NOT DETECTED Final    Comment: Performed at [facility], [place] [address], [place], [place] [address]    Radiology Studies: DG Chest [place] 1 View  Result Date: 08/10/2022 CLINICAL DATA:  81 year old female with respiratory failure and hypoxia. Sepsis. Intubated. EXAM: PORTABLE CHEST 1 VIEW COMPARISON:  CTA chest 08/08/2022 and earlier. FINDINGS: Portable AP semi upright view at 0419 hours. The patient is more rotated to the right. Endotracheal tube tip is stable at the level the clavicles. Left subclavian vascular catheter is stable. Enteric tube courses to the abdomen, tip not included. Mildly lower lung volumes. Prior CABG. Stable cardiac size and mediastinal contours. No pneumothorax, pleural effusion or consolidation. Mildly increased crowding of markings or vascular congestion. No overt edema. Stable visualized osseous structures. Prior sternotomy. Negative visible bowel gas. IMPRESSION: 1. Stable lines and tubes. 2. Mildly lower lung volumes with increased crowding of markings or vascular congestion. But no overt edema. Electronically Signed   By: Genevie Ann M.D.   On: 08/10/2022 04:56    Scheduled Meds:  aspirin  81 mg Per Tube Daily   Chlorhexidine Gluconate Cloth  6 each Topical Daily   dextrose  1 ampule Intravenous Once   insulin aspart  0-9 Units Subcutaneous Q4H   insulin glargine-yfgn  8 Units Subcutaneous Daily   mouth rinse  15 mL Mouth Rinse Q2H   pantoprazole (PROTONIX) IV  40 mg Intravenous QHS   Continuous Infusions:  ceFEPime (MAXIPIME) IV Stopped (08/10/22 2229)   heparin 700 Units/hr (08/11/22 0900)   metronidazole  500 mg (08/11/22 1217)     LOS: 3 days    Time spent: 50 mins    Carrie Diers, MD [facility]   If 7PM-7AM, please contact night-coverage

## 2022-08-11 NOTE — Evaluation (Signed)
Occupational Therapy Evaluation Patient Details Name: Carrie Kane MRN: [id number] DOB: [birth year] Today's Date: 08/11/2022   History of Present Illness 81 y.o female with significant PMH of Left displaced femoral neck fracture, T2DM, CAD with Hx of CABG X 3, PAD s/p revascularization 2022, PSVT, Anemia, Hypothyroidism,hypertension, HLD, CVA (02/2022) who presented to the ED with chief complaints of altered mental status and shortness of breath. On Presentation, she was noted to be hypotensive, have severe metabolic acidosis and hyperglycemia, with a positive beta-hydroxybutarate.  Pt intubated from 08/08/2022 thru 08/10/2022.   Clinical Impression   Carrie Kane was seen for OT evaluation this date. Prior to hospital admission, pt was MOD I using RW. Pt lives with spouse. Pt presents to acute OT demonstrating impaired ADL performance and functional mobility 2/2 decreased activity tolerance and functional strength/ROM/balance deficits. Oriented to self and location as hospital only. Difficulty answering questions as pt focused on getting blankets.  Upon arrival pt seated EOB with PT, requesting to return to bed, +2 assistance sit>sup. Pt currently requires SETUP bed level grooming tasks, poor tolerance for seated grooming. Anticipate +2 assist for toileting. Pt would benefit from skilled OT to address noted impairments and functional limitations (see below for any additional details). Upon hospital discharge, recommend STR to maximize pt safety and return to PLOF.   Recommendations for follow up therapy are one component of a multi-disciplinary discharge planning process, led by the attending physician.  Recommendations may be updated based on patient status, additional functional criteria and insurance authorization.   Follow Up Recommendations  Skilled nursing-short term rehab (<3 hours/day)    Assistance Recommended at Discharge Frequent or constant Supervision/Assistance  Patient can return  home with the following Two people to help with walking and/or transfers;Two people to help with bathing/dressing/bathroom    Functional Status Assessment  Patient has had a recent decline in their functional status and demonstrates the ability to make significant improvements in function in a reasonable and predictable amount of time.  Equipment Recommendations  Other (comment) (defer)    Recommendations for Other Services       Precautions / Restrictions Precautions Precautions: Fall Restrictions Weight Bearing Restrictions: No      Mobility Bed Mobility Overal bed mobility: Needs Assistance Bed Mobility: Sit to Supine       Sit to supine: Mod assist, +2 for physical assistance        Transfers                   General transfer comment: unsafe to attempt      Balance Overall balance assessment: Needs assistance Sitting-balance support: Feet supported, Bilateral upper extremity supported Sitting balance-Leahy Scale: Fair                                     ADL either performed or assessed with clinical judgement   ADL Overall ADL's : Needs assistance/impaired                                       General ADL Comments: SETUP bed level grooming tasks, poor tolerance for seated grooming. Anticipate +2 assist for toileting.      Pertinent Vitals/Pain Pain Assessment Pain Assessment: Faces Faces Pain Scale: No hurt     Hand Dominance     Extremity/Trunk Assessment Upper Extremity Assessment Upper  Extremity Assessment: Generalized weakness   Lower Extremity Assessment Lower Extremity Assessment: Generalized weakness       Communication Communication Communication: No difficulties   Cognition Arousal/Alertness: Awake/alert Behavior During Therapy: Flat affect Overall Cognitive Status: No family/caregiver present to determine baseline cognitive functioning                                  General Comments: pt states location as hospital, unable to name hospital or reason admitted.                Home Living Family/patient expects to be discharged to:: Private residence Living Arrangements: Spouse/significant other Available Help at Discharge: Family;Available 24 hours/day Type of Home: House Home Access: Stairs to enter [employer] of Steps: 6 Entrance Stairs-Rails: Right;Left Home Layout: One level     Bathroom Shower/Tub: Occupational psychologist: Handicapped height     Home Equipment: Marine scientist - single point;Grab bars - tub/shower   Additional Comments: setup per chart      Prior Functioning/Environment Prior Level of Function : Independent/Modified Independent             Mobility Comments: using RW for household mobility          OT Problem List: Decreased strength;Decreased range of motion;Decreased activity tolerance;Impaired balance (sitting and/or standing);Decreased safety awareness      OT Treatment/Interventions: Self-care/ADL training;Therapeutic exercise;DME and/or AE instruction;Therapeutic activities;Patient/family education;Balance training    OT Goals(Current goals can be found in the care plan section) Acute Rehab OT Goals Patient Stated Goal: to get stronger OT Goal Formulation: With patient Time For Goal Achievement: 08/25/22 Potential to Achieve Goals: Good ADL Goals Pt Will Perform Grooming: sitting;with supervision;with set-up (tolerates >10 mins) Pt Will Perform Lower Body Dressing: with min assist;sit to/from stand Pt Will Transfer to Toilet: with min assist;bedside commode;stand pivot transfer  OT Frequency: Min 2X/week    Co-evaluation              AM-PAC OT "6 Clicks" Daily Activity     Outcome Measure Help from another person eating meals?: A Little Help from another person taking care of personal grooming?: A Lot Help from another person toileting, which includes using  toliet, bedpan, or urinal?: A Lot Help from another person bathing (including washing, rinsing, drying)?: A Lot Help from another person to put on and taking off regular upper body clothing?: A Little Help from another person to put on and taking off regular lower body clothing?: A Lot 6 Click Score: 14   End of Session Nurse Communication: Mobility status  Activity Tolerance: Patient tolerated treatment well Patient left: in bed;with call bell/phone within reach;with nursing/sitter in room  OT Visit Diagnosis: Other abnormalities of gait and mobility (R26.89);Muscle weakness (generalized) (M62.81)                Time: 2876-8115 OT Time Calculation (min): 10 min Charges:  OT General Charges $OT Visit: 1 Visit OT Evaluation $OT Eval Low Complexity: 1 Low  Dessie Coma, M.S. OTR/L  08/11/22, 4:35 PM  ascom [phone]

## 2022-08-11 NOTE — Evaluation (Signed)
Clinical/Bedside Swallow Evaluation Patient Details  Name: Carrie Kane MRN: [id number] Date of Birth: 1941/06/11  Today's Date: 08/11/2022 Time: SLP Start Time (ACUTE ONLY): 1601 SLP Stop Time (ACUTE ONLY): 1018 SLP Time Calculation (min) (ACUTE ONLY): 30 min  Past Medical History:  Past Medical History:  Diagnosis Date   CAD (coronary artery disease)    a. 04/2017 Cath: LM 22, LAD 80p, 68m LCX 95ost, 750mEF 45-50%; b. 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   Diastolic dysfunction    a. 04/2017 Echo: EF 55-60%, no rwma, Gr1 DD, mildly dil LA; b. 08/2020 Echo: EF 55-60%, no rwma, GrII DD, nl RV fxn, RVSP 30104m, mod dil LA, mildly dil RA, Mod TR.   Hyperlipidemia    Hypertensive heart disease    Insulin dependent diabetes mellitus    PAD (peripheral artery disease) ([facility]  a. 02/2021 PTA/DCBA R Peroneal, R Popliteal, distal R SFA.   PSVT (paroxysmal supraventricular tachycardia)    a. 02/2022 Zio: Predominantly sinus rhythm @ 61 (36-218). 2 NSVT runs (fastest/longest 6 beats @ 218). 37 SVT/A tach runs (fastest 185 x 5 beats, longest 32.4 secs @ 107). Triggered events = RSR, PAC.   [facility]  a. 01/2022 R sided wkns/aphasia/tremor/slurred speech-->Ss resolved; b. 02/2022 MRI/A: punctate subacute inf vs artifact-post limb of L int capsule. 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 Mod, chronic small vessel isch changes within the cerebral white matter.  Subcm chronic infarct- L cerebellar hemisphere.  Sev dzs prox P2 seg of R PCA. Mod dzs A1 R antClinical cytogeneticist Past Surgical History:  Past Surgical History:  Procedure Laterality Date   ABDOMINAL HYSTERECTOMY     APPLICATION OF WOUND VAC  05/11/2022   Procedure: APPLICATION OF WOUND VAC;  Surgeon: CraRenee HarderD;  Location: [facility] ORS;  Service: Orthopedics;;   CARDIAC CATHETERIZATION     CATARACT EXTRACTION W/PHACO Right 10/26/2021   Procedure: CATARACT EXTRACTION PHACO AND INTRAOCULAR LENS  PLACEMENT (IOC) RIGHT DIABETIC 16.27 01:23.8;  Surgeon: KinEulogio BearD;  [facility];  Laterality: Right;  Please leave arrival at 8:00   CATARACT EXTRACTION W/PHACO Left 11/09/2021   Procedure: CATARACT EXTRACTION PHACO AND INTRAOCULAR LENS PLACEMENT (IOCPierceEFT DIABETIC;  Surgeon: KinEulogio BearD;  [facility];  Laterality: Left;  13.39 01:13.0   CORONARY ARTERY BYPASS GRAFT N/A 05/09/2017   Procedure: CORONARY ARTERY BYPASS GRAFTING (CABG) x 3 using left internal mammary artery and right greater saphenous vein harvested endoscopically;  Surgeon: VanIvin PootD;  [facility];  Laterality: N/A;   EYE SURGERY     HIP ARTHROPLASTY Left 05/11/2022   Procedure: ARTHROPLASTY BIPOLAR HIP (HEMIARTHROPLASTY);  Surgeon: CraRenee HarderD;  Location: [facility] ORS;  Service: Orthopedics;  Laterality: Left;   INTRAOPERATIVE TRANSESOPHAGEAL ECHOCARDIOGRAM N/A 05/09/2017   Procedure: INTRAOPERATIVE TRANSESOPHAGEAL ECHOCARDIOGRAM;  Surgeon: VanIvin PootD;  [facility] [place];  Laterality: N/A;   IR RADIOLOGIST EVAL & MGMT  12/04/2020   LEFT HEART CATH AND CORONARY ANGIOGRAPHY N/A 05/02/2017   Procedure: Left Heart Cath and Coronary Angiography;  Surgeon: AriWellington HampshireD;  [facility];  Service: Cardiovascular;  Laterality: N/A;   LOWER EXTREMITY ANGIOGRAPHY Left 02/09/2021   Procedure: LOWER EXTREMITY ANGIOGRAPHY;  Surgeon: DewAlgernon HuxleyD;  [facility];  Service: Cardiovascular;  Laterality: Left;  LOWER EXTREMITY ANGIOGRAPHY Right 02/16/2021   Procedure: LOWER EXTREMITY ANGIOGRAPHY;  Surgeon: Algernon Huxley, MD;  Location: [place] [facility];  Service: Cardiovascular;  Laterality: Right;   HPI:  81 y.o female with significant PMH of Left displaced femoral neck fracture, T2DM, CAD with Hx of  CABG X 3, PAD s/p revascularization 2022, PSVT, Anemia, Hypothyroidism,hypertension, HLD, CVA (02/2022) who presented to the ED with chief complaints of altered mental status and shortness of breath. On Presentation, she was noted to be hypotensive, have severe metabolic acidosis and hyperglycemia, with a positive beta-hydroxybutarate.  Pt intubated from 08/08/2022 thru 08/10/2022.       Most recent chest x-ray (08/10/2022) revealed Mildly lower lung volumes with increased crowding of markings or  vascular congestion. But no overt edema. CT Head on 08/08/2022 revealed that Imaging is limited by motion artifact. Mild parenchymal volume loss is commensurate with the patient's age. Moderate periventricular white matter changes are present likely reflecting the sequela of small vessel ischemia, stable since prior examination. No evidence of acute intracranial hemorrhage or infarct. No abnormal mass effect or midline shift. No abnormal intra  or extra-axial mass lesion.      Of note, pt recently admitted on 02/01/2022 for acute CVA. Outpatient MRI on 03/03/2022 revealed Punctate subacute infarct versus artifact within the posterior limb of left internal capsule. Chronic lacunar infarcts within the right caudate nucleus, right thalamus and left pons. Moderate chronic small vessel ischemic changes within the cerebral  white matter. Subcentimeter chronic infarcts within the left cerebellar hemisphere.    Assessment / Plan / Recommendation  Clinical Impression  Pt presents as alert but altered. Per outpatient neurology notes, all stroke like symptoms were resolved from previous CVA on 02/01/2022. Pt spoke in one-word perseverations, unable to answer basic questions, distracted by looking at her left hand (opening and closing it) and unable to follow 1-step directions. When presented with ice chips via spoon, pt demonstrated prolonged oral phase with confused look on pt's face. Pt also began subtle grunting that was  not present prior to consuming ice chips. When presented with another spoonful, pt stated "wait." Pt with multiple effortful appearing swallows. When questioned, pt responded "ice" to allow questions. After ~ 3 minutes, pt consumed a second spoon of ice chips. The above behavior was present during this trial. At this time, recommend ice chips and water be provided by nursing following oral care. May have some floor stock puree (applesauce) when pt is wake and alert. ST will follow for diet upgrade. SLP Visit Diagnosis: Dysphagia, unspecified (R13.10)    Aspiration Risk  Moderate aspiration risk    Diet Recommendation Ice chips PRN after oral care;Free water protocol after oral care   Liquid Administration via: Straw;Spoon Medication Administration: Crushed with puree Supervision: Full supervision/cueing for compensatory strategies;Staff to assist with self feeding Compensations: Minimize environmental distractions;Slow rate;Small sips/bites Postural Changes: Seated upright at 90 degrees;Remain upright for at least 30 minutes after po intake    Other  Recommendations Oral Care Recommendations: Oral care prior to ice chip/H20    Recommendations for follow up therapy are one component of a multi-disciplinary discharge planning process, led by the attending physician.  Recommendations may be updated based on patient status, additional functional criteria and insurance authorization.  Follow up Recommendations Follow physician's recommendations for discharge plan and follow up therapies      Assistance Recommended at Discharge Frequent or constant Supervision/Assistance  Functional Status Assessment Patient has had a recent decline in their functional  status and/or demonstrates limited ability to make significant improvements in function in a reasonable and predictable amount of time  Frequency and Duration min 2x/week  2 weeks       Prognosis Prognosis for Safe Diet Advancement:  Good Barriers to Reach Goals: Cognitive deficits      Swallow Study   General Date of Onset: 08/08/22 HPI: 81 y.o female with significant PMH of Left displaced femoral neck fracture, T2DM, CAD with Hx of CABG X 3, PAD s/p revascularization 2022, PSVT, Anemia, Hypothyroidism,hypertension, HLD, CVA (02/2022) who presented to the ED with chief complaints of altered mental status and shortness of breath. On Presentation, she was noted to be hypotensive, have severe metabolic acidosis and hyperglycemia, with a positive beta-hydroxybutarate.  Pt intubated from 08/08/2022 thru 08/10/2022.     Most recent chest x-ray (08/10/2022) revealed Mildly lower lung volumes with increased crowding of markings or  vascular congestion. But no overt edema. CT Head on 08/08/2022 revealed that Imaging is limited by motion artifact. Mild parenchymal volume loss is commensurate with the patient's age. Moderate periventricular white matter changes are present likely reflecting the sequela of small vessel ischemia, stable since prior examination. No evidence of acute intracranial hemorrhage or infarct. No abnormal mass effect or midline shift. No abnormal intra  or extra-axial mass lesion.    Of note, pt recently admitted on 02/01/2022 for acute CVA. Outpatient MRI on 03/03/2022 revealed Punctate subacute infarct versus artifact within the posterior limb of left internal capsule. Chronic lacunar infarcts within the right caudate nucleus, right thalamus and left pons. Moderate chronic small vessel ischemic changes within the cerebral  white matter. Subcentimeter chronic infarcts within the left cerebellar hemisphere. Type of Study: Bedside Swallow Evaluation Previous Swallow Assessment: none in chart Diet Prior to this Study: NPO Temperature Spikes Noted: No Respiratory Status: Room air History of Recent Intubation: Yes Length of Intubations (days): 2 days Date extubated: 08/07/22 Behavior/Cognition: Alert;Pleasant  mood;Confused;Requires cueing;Distractible Oral Cavity Assessment: Within Functional Limits Oral Care Completed by SLP: Recent completion by staff Oral Cavity - Dentition: Missing dentition Self-Feeding Abilities: Total assist Patient Positioning: Upright in bed Baseline Vocal Quality: Low vocal intensity Volitional Cough: Cognitively unable to elicit Volitional Swallow: Unable to elicit    Oral/Motor/Sensory Function Overall Oral Motor/Sensory Function: Generalized oral weakness   Ice Chips Ice chips: Impaired Presentation: Spoon Pharyngeal Phase Impairments: Multiple swallows Other Comments: safety impacted by cognitive communication deficits.   Thin Liquid Thin Liquid: Not tested    Nectar Thick Nectar Thick Liquid: Not tested   Honey Thick Honey Thick Liquid: Not tested   Puree Puree: Not tested   Solid     Solid: Not tested     Novia Lansberry B. Rutherford Nail, M.S., CCC-SLP, [place] Pathologist Certified Brain Injury [place] Office [phone] Ascom [phone] Fax [phone]  Marios Gaiser Rutherford Nail 08/11/2022,12:49 PM

## 2022-08-11 NOTE — Progress Notes (Signed)
[place] for heparin infusion Indication: NSTEMI  Allergies  Allergen Reactions   Shellfish Allergy Anaphylaxis    Throat swelling   Flexeril [Cyclobenzaprine] Hypertension    Patient Measurements: Height: '5\' 8"'$  (172.7 cm) Weight: 71.9 kg (158 lb 8.2 oz) IBW/kg (Calculated) : 63.9 Heparin Dosing Weight: 63.5 kg  Vital Signs: Temp: 97.3 F (36.3 C) (10/24 1940) BP: 131/52 (10/25 0400) Pulse Rate: 68 (10/25 0400)  Labs: Recent Labs    08/08/22 2012 08/08/22 2242 08/09/22 0526 08/09/22 0745 08/09/22 1048 08/10/22 0420 08/10/22 0639 08/11/22 0420  HGB  --    < > 9.6*  --   --  10.0*  --  9.1*  HCT  --   --  28.6*  --   --  30.6*  --  28.2*  PLT  --   --  188  --   --  173  --  131*  APTT  --   --   --  113*  --   --   --   --   LABPROT  --   --   --  15.5*  --   --   --   --   INR  --   --   --  1.2  --   --   --   --   HEPARINUNFRC  --    < > 0.63  --   --  0.70  --  0.34  CREATININE 1.68*  --  1.93*  --   --  2.33*  --   --   TROPONINIHS  --   --   --  >24,000* >24,000*  --  >24,000*  --    < > = values in this interval not displayed.     Estimated Creatinine Clearance: 19.7 mL/min (A) (by C-G formula based on SCr of 2.33 mg/dL (H)).   Medical History: Past Medical History:  Diagnosis Date   CAD (coronary artery disease)    a. 04/2017 Cath: LM 10, LAD 80p, 34m LCX 95ost, 732mEF 45-50%; b. 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   Diastolic dysfunction    a. 04/2017 Echo: EF 55-60%, no rwma, Gr1 DD, mildly dil LA; b. 08/2020 Echo: EF 55-60%, no rwma, GrII DD, nl RV fxn, RVSP 3059m, mod dil LA, mildly dil RA, Mod TR.   Hyperlipidemia    Hypertensive heart disease    Insulin dependent diabetes mellitus    PAD [place]peripheral artery disease) ([facility]  a. 02/2021 PTA/DCBA R Peroneal, R Popliteal, distal R SFA.   PSVT (paroxysmal supraventricular tachycardia)    a. 02/2022 Zio: Predominantly sinus rhythm @ 61 (36-218). 2  NSVT runs (fastest/longest 6 beats @ 218). 37 SVT/A tach runs (fastest 185 x 5 beats, longest 32.4 secs @ 107). Triggered events = RSR, PAC.   [facility]  a. 01/2022 R sided wkns/aphasia/tremor/slurred speech-->Ss resolved; b. 02/2022 MRI/A: punctate subacute inf vs artifact-post limb of L int capsule. 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 Mod, chronic small vessel isch changes within the cerebral white matter.  Subcm chronic infarct- L cerebellar hemisphere.  Sev dzs prox P2 seg of R PCA. Mod dzs A1 R antClinical cytogeneticist  Assessment: Pt is a [age] female presenting to ED w/ AMS, found with elevated Troponin I level, being treated for NSTEMI.  Goal of Therapy:  INR 2-3 Monitor platelets by anticoagulation protocol: Yes   Date/Time  HL Rate  10/22'@1439'$   0.62 750 units/hr, therapeutic x1 10/22 2242  0.68 Therapeutic x 2 10/23 0526  0.63 Therapeutic x 3 10/24 0420  0.70 Therapeutic x 4 10/25 0420  0.34 Therapeutic x 5  Plan:  Will continue heparin infusion at 700 units/hr Will recheck HL w/ AM daily while therapeutic CBC daily while on heparin  Pearla Dubonnet, PharmD Clinical Pharmacist 08/11/2022 5:01 AM

## 2022-08-11 NOTE — Evaluation (Signed)
Physical Therapy Evaluation Patient Details Name: Carrie Kane MRN: [id number] DOB: October 09, 1941 Today's Date: 08/11/2022  History of Present Illness  81 y.o female with significant PMH of Left displaced femoral neck fracture, T2DM, CAD with Hx of CABG X 3, PAD s/p revascularization 2022, PSVT, Anemia, Hypothyroidism,hypertension, HLD, CVA (02/2022) who presented to the ED with chief complaints of altered mental status and shortness of breath. On Presentation, she was noted to be hypotensive, have severe metabolic acidosis and hyperglycemia, with a positive beta-hydroxybutarate.  Pt intubated from 08/08/2022 thru 08/10/2022.  Clinical Impression  Pt eager to work with PT, but was surprisingly weak (given ostensible PLOF and relatively short time intubated) and struggled with most aspects of PT. She needed heavy assist to get up to sitting, but actually did maintain sitting ~2 minutes before needing heavy assist to get back into the bed and then assist with re-positioning.  Overall pt showed profound weakness and needed assist with all aspects of PT/mobility/etc., recommending rehab when medically ready for d/c.      Recommendations for follow up therapy are one component of a multi-disciplinary discharge planning process, led by the attending physician.  Recommendations may be updated based on patient status, additional functional criteria and insurance authorization.  Follow Up Recommendations Skilled nursing-short term rehab (<3 hours/day) Can patient physically be transported by private vehicle: No    Assistance Recommended at Discharge Frequent or constant Supervision/Assistance  Patient can return home with the following  Two people to help with walking and/or transfers;A lot of help with bathing/dressing/bathroom;Assistance with cooking/housework;Assist for transportation;Help with stairs or ramp for entrance    Equipment Recommendations  (TBD at rehab)  Recommendations for Other  Services       Functional Status Assessment Patient has had a recent decline in their functional status and demonstrates the ability to make significant improvements in function in a reasonable and predictable amount of time.     Precautions / Restrictions Precautions Precautions: Fall Restrictions Weight Bearing Restrictions: No      Mobility  Bed Mobility Overal bed mobility: Needs Assistance Bed Mobility: Sit to Supine, Supine to Sit     Supine to sit: Mod assist Sit to supine: Mod assist, Max assist   General bed mobility comments: Pt showed some effort with getting LEs toward EOB and using UEs to try and assist to upright, but ultimately needed considerable assist to attain sitting EOB.  Once assisted to squared with EOB she was able to maintain sitting generally reaching to hold rail with a hand    Transfers                   General transfer comment: too weak, unsafe to attempt    Ambulation/Gait                  Stairs            Wheelchair Mobility    Modified Rankin (Stroke Patients Only)       Balance Overall balance assessment: Needs assistance Sitting-balance support: Feet supported, Bilateral upper extremity supported Sitting balance-Leahy Scale: Fair Sitting balance - Comments: unable to attain sitting w/o assist, once adjusted she was able to tolerate 1-2 minutes, often reaching to hold with UEs                                     Pertinent Vitals/Pain      Home Living  Family/patient expects to be discharged to:: Private residence Living Arrangements: Spouse/significant other Available Help at Discharge: Family;Available 24 hours/day Type of Home: House Home Access: Stairs to enter Entrance Stairs-Rails: Psychiatric nurse of Steps: 6   Home Layout: One level Home Equipment: Shower seat;Cane - single point;Grab bars - tub/shower Additional Comments: setup per chart    Prior Function  Prior Level of Function : Independent/Modified Independent             Mobility Comments: using RW for household mobility ADLs Comments: Independent with ADLs     Hand Dominance        Extremity/Trunk Assessment   Upper Extremity Assessment Upper Extremity Assessment: Generalized weakness (pt with limited against gravity strength/motion in all planes)    Lower Extremity Assessment Lower Extremity Assessment: Generalized weakness (grossly <3/5 b/l LEs - no focal defecits)       Communication   Communication: No difficulties (somewhat slurred speech)  Cognition Arousal/Alertness: Awake/alert Behavior During Therapy: Flat affect Overall Cognitive Status:  (husband reports she seems a little off from her normal)                                          General Comments General comments (skin integrity, edema, etc.): Pt very weak, especially given that she was able to be up ad lib in the home with a walker and could get out into the community at times.    Exercises     Assessment/Plan    PT Assessment Patient needs continued PT services  PT Problem List Decreased strength;Decreased range of motion;Decreased activity tolerance;Decreased balance;Decreased mobility;Decreased cognition;Decreased knowledge of use of DME;Decreased safety awareness;Cardiopulmonary status limiting activity       PT Treatment Interventions DME instruction;Stair training;Gait training;Functional mobility training;Therapeutic activities;Balance training;Therapeutic exercise;Neuromuscular re-education;Patient/family education    PT Goals (Current goals can be found in the Care Plan section)  Acute Rehab PT Goals Patient Stated Goal: get stronger at rehab PT Goal Formulation: With patient Time For Goal Achievement: 08/24/22 Potential to Achieve Goals: Fair    Frequency Min 2X/week     Co-evaluation               AM-PAC PT "6 Clicks" Mobility  Outcome Measure Help  needed turning from your back to your side while in a flat bed without using bedrails?: A Lot Help needed moving from lying on your back to sitting on the side of a flat bed without using bedrails?: A Lot Help needed moving to and from a bed to a chair (including a wheelchair)?: Total Help needed standing up from a chair using your arms (e.g., wheelchair or bedside chair)?: Total Help needed to walk in hospital room?: Total Help needed climbing 3-5 steps with a railing? : Total 6 Click Score: 8    End of Session Equipment Utilized During Treatment: Oxygen Activity Tolerance: Patient limited by fatigue Patient left: in bed;with call bell/phone within reach;Other (comment) (hand off to OT) Nurse Communication: Mobility status PT Visit Diagnosis: Muscle weakness (generalized) (M62.81);Difficulty in walking, not elsewhere classified (R26.2);Unsteadiness on feet (R26.81)    Time: 1517-6160 PT Time Calculation (min) (ACUTE ONLY): 28 min   Charges:   PT Evaluation $PT Eval Low Complexity: 1 Low PT Treatments $Therapeutic Activity: 8-22 mins        Kreg Shropshire, DPT 08/11/2022, 5:17 PM

## 2022-08-11 NOTE — Consult Note (Signed)
[place] for Electrolyte Monitoring and Replacement   Recent Labs: Potassium (mmol/L)  Date Value  08/11/2022 3.5   Magnesium (mg/dL)  Date Value  08/11/2022 1.9   Calcium (mg/dL)  Date Value  08/11/2022 8.5 (L)   Albumin (g/dL)  Date Value  08/11/2022 2.4 (L)  07/15/2022 3.9   Phosphorus (mg/dL)  Date Value  08/11/2022 2.9   Sodium (mmol/L)  Date Value  08/11/2022 142  07/15/2022 138   Corrected Ca: 9.8 mg/dL  Assessment: Pharmacy has been consulted to monitor and replace electrolytes in 81yo female admitted with acute respiratory failure, AKI, DKA, and lactic acidosis. Patient was transferred to ICU intubated.  Goal of Therapy:  Electrolytes WNL  Plan:  No electrolyte replacement warranted for today. Recheck electrolytes in am  Carrie Kane , Pharmacy Student 08/11/2022 10:40 AM

## 2022-08-11 NOTE — Progress Notes (Signed)
Nutrition Follow-up  DOCUMENTATION CODES:   Non-severe (moderate) malnutrition in context of chronic illness  INTERVENTION:   RD will monitor for diet advancement and add supplements once appropriate.   NUTRITION DIAGNOSIS:   Moderate Malnutrition related to chronic illness (CVA) as evidenced by mild fat depletion, mild muscle depletion, percent weight loss. -ongoing   GOAL:   Patient will meet greater than or equal to 90% of their needs -not met   MONITOR:   Diet advancement, Labs, Weight trends, Skin, I & O's  ASSESSMENT:   81 y/o female with h/o hip fracture, PAD s/p revascularization 2022, NSTEMI, PAF, DM, CAD s/p CABG x 3, HLD, hirsutism, IDA, HTN, hypothroidism and CVA who is admitted with septic shock and DKA.  Pt extubated 10/24. Pt seen by SLP today but was unable to be placed on a diet. Spoke with SLP who is hopeful that pt may be able to be initiated on a diet tomorrow. Will need to place NGT for nutrition support if patient is unable to be placed on a diet. Per chart, pt remains weight stable since admission.   Medications reviewed and include: aspirin, insulin, protonix, cefepime, heparin, metronidazole   Labs reviewed: K 3.5 wnl, BUN 37(H), creat 1.56(H), P 2.9 wnl, Mg 1.9 wnl Hgb 9.1(L), Hct 28.2(L) Cbgs- 113, 95, 98, 81 x 24 hrs   Diet Order:   Diet Order             Diet NPO time specified  Diet effective now                  EDUCATION NEEDS:   Not appropriate for education at this time  Skin:  Skin Assessment: Reviewed RN Assessment  Last BM:  pta  Height:   Ht Readings from Last 1 Encounters:  08/08/22 '5\' 8"'  (1.727 m)    Weight:   Wt Readings from Last 1 Encounters:  08/11/22 71.9 kg    Ideal Body Weight:  63.6 kg  BMI:  Body mass index is 24.1 kg/m.  Estimated Nutritional Needs:   Kcal:  1700-1900kcal/day  Protein:  85-95g/day  Fluid:  1.6-1.8L/day  Koleen Distance MS, RD, LDN Please refer to [facility] for RD and/or RD  on-call/weekend/after hours pager

## 2022-08-11 NOTE — Progress Notes (Signed)
Progress Note  Patient Name: Carrie Kane Date of Encounter: 08/11/2022  Primary Cardiologist: Rockey Situ  Subjective   Extubated yesterday. Now on room air. No chest pain or dyspnea. No longer requiring vasopressor support. Renal function improving.   Inpatient Medications    Scheduled Meds:  aspirin  81 mg Per Tube Daily   Chlorhexidine Gluconate Cloth  6 each Topical Daily   dextrose  1 ampule Intravenous Once   insulin aspart  0-9 Units Subcutaneous Q4H   insulin glargine-yfgn  8 Units Subcutaneous Daily   mouth rinse  15 mL Mouth Rinse Q2H   pantoprazole (PROTONIX) IV  40 mg Intravenous QHS   Continuous Infusions:  ceFEPime (MAXIPIME) IV Stopped (08/10/22 2229)   heparin 700 Units/hr (08/11/22 0600)   metronidazole Stopped (08/11/22 0523)   PRN Meds: dextrose, mouth rinse, polyethylene glycol   Vital Signs    Vitals:   08/11/22 0431 08/11/22 0500 08/11/22 0600 08/11/22 0700  BP:  (!) 126/55 137/68 139/67  Pulse:  68 64 68  Resp:  (!) '23 15 16  '$ Temp:  (!) 97.5 F (36.4 C)    TempSrc:  Oral    SpO2:  99% 99% 99%  Weight: 71.9 kg     Height:        Intake/Output Summary (Last 24 hours) at 08/11/2022 0911 Last data filed at 08/11/2022 0600 Gross per 24 hour  Intake 667.06 ml  Output 1110 ml  Net -442.94 ml   Filed Weights   08/08/22 0200 08/10/22 0447 08/11/22 0431  Weight: 63.5 kg 72.6 kg 71.9 kg    Telemetry    SR with PVCs - Personally Reviewed  ECG    No new tracings - Personally Reviewed  Physical Exam   GEN: No acute distress.   Neck: No JVD. Cardiac: RRR, no murmurs, rubs, or gallops.  Respiratory: Clear to auscultation bilaterally.  GI: Soft, nontender, non-distended.   MS: No edema; No deformity. Neuro:  Alert and oriented x 3; Nonfocal.  Psych: Normal affect.  Labs    Chemistry Recent Labs  Lab 08/09/22 0526 08/09/22 0747 08/10/22 0420 08/11/22 0420  NA 140  --  139 142  K 3.2*  --  4.2 3.5  CL 97*  --  100 102  CO2  33*  --  31 32  GLUCOSE 157*  --  142* 104*  BUN 35*  --  41* 37*  CREATININE 1.93*  --  2.33* 1.56*  CALCIUM 8.3*  --  8.0* 8.5*  PROT  --  5.0* 5.2* 4.9*  ALBUMIN  --  2.7* 2.6* 2.4*  AST  --  267* 169* 124*  ALT  --  92* 81* 68*  ALKPHOS  --  70 80 81  BILITOT  --  0.5 0.5 0.8  GFRNONAA 26*  --  21* 34*  ANIONGAP 10  --  8 8     Hematology Recent Labs  Lab 08/09/22 0526 08/10/22 0420 08/11/22 0420  WBC 19.2* 16.8* 8.8  RBC 3.18* 3.38* 3.06*  HGB 9.6* 10.0* 9.1*  HCT 28.6* 30.6* 28.2*  MCV 89.9 90.5 92.2  MCH 30.2 29.6 29.7  MCHC 33.6 32.7 32.3  RDW 14.0 14.8 14.9  PLT 188 173 131*    Cardiac EnzymesNo results for input(s): "TROPONINI" in the last 168 hours. No results for input(s): "TROPIPOC" in the last 168 hours.   BNP Recent Labs  Lab 08/08/22 0210  BNP 261.2*     DDimer No results for input(s): "DDIMER" in  the last 168 hours.   Radiology    DG Chest Port 1 View  Result Date: 08/10/2022 IMPRESSION: 1. Stable lines and tubes. 2. Mildly lower lung volumes with increased crowding of markings or vascular congestion. But no overt edema. Electronically Signed   By: Genevie Ann M.D.   On: 08/10/2022 04:56   US Abdomen Limited RUQ (LIVER/GB)  Result Date: 08/09/2022 IMPRESSION: Cholelithiasis. Free fluid adjacent to gallbladder without definite evidence of acute cholecystitis. Probable fatty infiltration of liver as above. Electronically Signed   By: Lavonia Dana M.D.   On: 08/09/2022 11:31    Cardiac Studies   2D echo 08/08/2022:  1. Left ventricular ejection fraction, by estimation, is 65 to 70%. The  left ventricle has normal function. The left ventricle has no regional  wall motion abnormalities. There is moderate asymmetric left ventricular  hypertrophy of the septal segment.  Left ventricular diastolic parameters are consistent with Grade I  diastolic dysfunction (impaired relaxation). Elevated left ventricular  end-diastolic pressure.   2. Right  ventricular systolic function is normal. The right ventricular  size is normal. There is mildly elevated pulmonary artery systolic  pressure.   3. Left atrial size was mildly dilated.   4. The mitral valve is normal in structure. Trivial mitral valve  regurgitation. No evidence of mitral stenosis.   5. Tricuspid valve regurgitation is mild to moderate.   6. The aortic valve is tricuspid. Aortic valve regurgitation is not  visualized. No aortic stenosis is present.   7. Aortic dilatation noted. There is mild dilatation of the ascending  aorta, measuring 37 mm.   8. The inferior vena cava is dilated in size with <50% respiratory  variability, suggesting right atrial pressure of 15 mmHg.  Patient Profile     81 y.o. female with history of CAD s/p CABG in 2018, HFpEF, CVA in 01/2022, SVT, NSVT, PAD s/p PTA, DM, HTN, and HLD who was admitted with acute hypoxic respiratory failure requiring mechanical ventilation with encephalopathy in the setting of DKA with metabolic derangement, undifferentiated shock and sepsis of unclear etiology and we are seeing for NSTEMI and new onset Afib.   Assessment & Plan    1. CAD s/p CABG with NSTEMI: -Despite significant elevation of troponin, there are no significant ischemic EKG changes noted upon admission -Continue heparin gtt (new onset Afib as well) -In the context of her significant acute illness and comorbid conditions including acute hypoxic respiratory failure requiring mechanical ventilation, AKI, anemia and sepsis of unclear etiology, emergent cardiac cath is not indicated at this time, however if she redevelops shock, this may need to be readdressed -As she continues to clinically improve, would look to pursue cardiac cath prior, perhaps later this week  -Addition of Plavix to ASA for medical management of NSTEMI once felt safe by the primary and surgical (work up of possible cholecystitis) teams   2. New onset Afib: -Noted to have paroxysms of Afib  on tele, currently in sinus rhythm, if she has recurrent Afib, IV amiodarone would be our only option at this time as she is not a candidate for AV nodal blocking medications given hypotension requiring vasopressor support and AKI precludes digoxin use -Hyperthyroid on labs this admission -Etiology multifactorial including acute illness and hyperthyroidism  -Not currently on AV nodal blocking medication in the context of hypotension previously requiring vasopressor support -CHADS2VASc 9 (CHF, HTN, age x 2, DM, CVA x 2, vascular disease, sex category) -Heparin gtt for now -Transition to [facility] prior  to discharge with aspirin or Plavix (pending cath findings) as long as hgb remains stable   3. Acute hypoxic respiratory failure with encephalopathy in the setting of DKA with metabolic derangement, undifferentiated shock and sepsis of unclear etiology: -Extubated 10/24, now on room air -No longer requiring vasopressor support -Management per CCM   4. HLD/PAD: -Resume stain when able   5. Anemia: -HGB stable when compared to admission and improved from values obtained several months prior -Anemia of uncertain etiology  -Management per primary service   6. AKI: -Likely ATN in the setting of hypotension requiring vasopressor support -Improving -Avoid nephrotoxic agents     7. Hyperthyroidism: -Possibly in the setting of her acute illness -Likely contributing to her Afib -Management per primary      For questions or updates, please contact [place] Please consult [email or web address] for contact info under Cardiology/STEMI.    Signed, Christell Faith, PA-C [place] Pager: [phone] 08/11/2022, 9:11 AM

## 2022-08-12 DIAGNOSIS — R4182 Altered mental status, unspecified: Secondary | ICD-10-CM | POA: Diagnosis not present

## 2022-08-12 DIAGNOSIS — G9341 Metabolic encephalopathy: Secondary | ICD-10-CM | POA: Diagnosis not present

## 2022-08-12 DIAGNOSIS — E1110 Type 2 diabetes mellitus with ketoacidosis without coma: Secondary | ICD-10-CM | POA: Diagnosis not present

## 2022-08-12 DIAGNOSIS — E860 Dehydration: Secondary | ICD-10-CM

## 2022-08-12 DIAGNOSIS — I214 Non-ST elevation (NSTEMI) myocardial infarction: Secondary | ICD-10-CM | POA: Diagnosis not present

## 2022-08-12 LAB — CBC
HCT: 26.3 % — ABNORMAL LOW (ref 36.0–46.0)
Hemoglobin: 8.7 g/dL — ABNORMAL LOW (ref 12.0–15.0)
MCH: 30.5 pg (ref 26.0–34.0)
MCHC: 33.1 g/dL (ref 30.0–36.0)
MCV: 92.3 fL (ref 80.0–100.0)
Platelets: 137 10*3/uL — ABNORMAL LOW (ref 150–400)
RBC: 2.85 MIL/uL — ABNORMAL LOW (ref 3.87–5.11)
RDW: 14.5 % (ref 11.5–15.5)
WBC: 7.8 10*3/uL (ref 4.0–10.5)
nRBC: 0 % (ref 0.0–0.2)

## 2022-08-12 LAB — COMPREHENSIVE METABOLIC PANEL
ALT: 55 U/L — ABNORMAL HIGH (ref 0–44)
AST: 81 U/L — ABNORMAL HIGH (ref 15–41)
Albumin: 2.4 g/dL — ABNORMAL LOW (ref 3.5–5.0)
Alkaline Phosphatase: 79 U/L (ref 38–126)
Anion gap: 9 (ref 5–15)
BUN: 29 mg/dL — ABNORMAL HIGH (ref 8–23)
CO2: 28 mmol/L (ref 22–32)
Calcium: 8.3 mg/dL — ABNORMAL LOW (ref 8.9–10.3)
Chloride: 106 mmol/L (ref 98–111)
Creatinine, Ser: 1.03 mg/dL — ABNORMAL HIGH (ref 0.44–1.00)
GFR, Estimated: 55 mL/min — ABNORMAL LOW (ref >60)
Glucose, Bld: 103 mg/dL — ABNORMAL HIGH (ref 70–99)
Potassium: 3.4 mmol/L — ABNORMAL LOW (ref 3.5–5.1)
Sodium: 143 mmol/L (ref 135–145)
Total Bilirubin: 1.1 mg/dL (ref 0.3–1.2)
Total Protein: 4.8 g/dL — ABNORMAL LOW (ref 6.5–8.1)

## 2022-08-12 LAB — GLUCOSE, CAPILLARY
Glucose-Capillary: 107 mg/dL — ABNORMAL HIGH (ref 70–99)
Glucose-Capillary: 119 mg/dL — ABNORMAL HIGH (ref 70–99)
Glucose-Capillary: 139 mg/dL — ABNORMAL HIGH (ref 70–99)
Glucose-Capillary: 181 mg/dL — ABNORMAL HIGH (ref 70–99)
Glucose-Capillary: 199 mg/dL — ABNORMAL HIGH (ref 70–99)
Glucose-Capillary: 249 mg/dL — ABNORMAL HIGH (ref 70–99)
Glucose-Capillary: 287 mg/dL — ABNORMAL HIGH (ref 70–99)
Glucose-Capillary: 94 mg/dL (ref 70–99)

## 2022-08-12 LAB — MAGNESIUM: Magnesium: 1.9 mg/dL (ref 1.7–2.4)

## 2022-08-12 LAB — PHOSPHORUS: Phosphorus: 2.2 mg/dL — ABNORMAL LOW (ref 2.5–4.6)

## 2022-08-12 LAB — HEPARIN LEVEL (UNFRACTIONATED)
Heparin Unfractionated: 0.16 IU/mL — ABNORMAL LOW (ref 0.30–0.70)
Heparin Unfractionated: 0.4 IU/mL (ref 0.30–0.70)
Heparin Unfractionated: 0.3 IU/mL (ref 0.30–0.70)

## 2022-08-12 MED ORDER — POTASSIUM PHOSPHATES 15 MMOLE/5ML IV SOLN
30.0000 mmol | Freq: Once | INTRAVENOUS | Status: DC
  Filled 2022-08-12: qty 10

## 2022-08-12 MED ORDER — POTASSIUM CHLORIDE 20 MEQ PO PACK
40.0000 meq | PACK | Freq: Once | ORAL | Status: AC
  Administered 2022-08-12: 40 meq
  Filled 2022-08-12: qty 2

## 2022-08-12 MED ORDER — ENSURE ENLIVE PO LIQD
237.0000 mL | Freq: Three times a day (TID) | ORAL | Status: DC
  Administered 2022-08-12 – 2022-08-16 (×7): 237 mL via ORAL

## 2022-08-12 MED ORDER — SODIUM CHLORIDE 0.9% FLUSH
3.0000 mL | Freq: Two times a day (BID) | INTRAVENOUS | Status: DC
  Administered 2022-08-12 – 2022-08-16 (×9): 3 mL via INTRAVENOUS

## 2022-08-12 MED ORDER — MAGNESIUM SULFATE IN D5W 1-5 GM/100ML-% IV SOLN
1.0000 g | Freq: Once | INTRAVENOUS | Status: AC
  Administered 2022-08-12: 1 g via INTRAVENOUS
  Filled 2022-08-12: qty 100

## 2022-08-12 MED ORDER — SODIUM CHLORIDE 0.9 % IV SOLN
2.0000 g | Freq: Two times a day (BID) | INTRAVENOUS | Status: AC
  Administered 2022-08-12 – 2022-08-14 (×4): 2 g via INTRAVENOUS
  Filled 2022-08-12: qty 2
  Filled 2022-08-12 (×2): qty 12.5
  Filled 2022-08-12: qty 2

## 2022-08-12 MED ORDER — ADULT MULTIVITAMIN W/MINERALS CH
1.0000 | ORAL_TABLET | Freq: Every day | ORAL | Status: DC
  Administered 2022-08-13 – 2022-08-16 (×4): 1 via ORAL
  Filled 2022-08-12 (×4): qty 1

## 2022-08-12 MED ORDER — SENNOSIDES-DOCUSATE SODIUM 8.6-50 MG PO TABS
1.0000 | ORAL_TABLET | Freq: Two times a day (BID) | ORAL | Status: DC
  Administered 2022-08-12 – 2022-08-16 (×9): 1 via ORAL
  Filled 2022-08-12 (×9): qty 1

## 2022-08-12 MED ORDER — HEPARIN BOLUS VIA INFUSION
2000.0000 [IU] | Freq: Once | INTRAVENOUS | Status: AC
  Administered 2022-08-12: 2000 [IU] via INTRAVENOUS
  Filled 2022-08-12: qty 2000

## 2022-08-12 MED ORDER — POTASSIUM PHOSPHATES 15 MMOLE/5ML IV SOLN
30.0000 mmol | Freq: Once | INTRAVENOUS | Status: AC
  Administered 2022-08-12: 30 mmol via INTRAVENOUS
  Filled 2022-08-12: qty 10

## 2022-08-12 NOTE — Progress Notes (Signed)
Speech Language Pathology Treatment: Dysphagia  Patient Details Name: Carrie Kane MRN: [id number] DOB: [birth year] Today's Date: 08/12/2022 Time: 0737-1062 SLP Time Calculation (min) (ACUTE ONLY): 25 min  Assessment / Plan / Recommendation Clinical Impression  Pt continues to present with mild encephalopathy c/b inability to recall orientation information, delayed processing as well moderate dysphonia s/p 2 day intubation. Her dysphonia is c/b hoarse, gravely, strained vocal quality.   Skilled observation was provided of pt consuming upgraded trials of puree and solids (graham cracker). Pt presents with mild oral phase deficits when consuming regular textures. This is likely multifactorial d/t dentition and encephalopathy. Pt is also weak and requires assistance to self-feed. Given these risks, recommend conservative diet of dysphagia 2 with thin liquids via straw, medicine whole with thin liquids.   ST to follow for further upgrade. Also secure chat sent to pt's attending on possible ENT consult in the setting of intubation and dysphonia.     HPI HPI: 81 y.o female with significant PMH of Left displaced femoral neck fracture, T2DM, CAD with Hx of CABG X 3, PAD s/p revascularization 2022, PSVT, Anemia, Hypothyroidism,hypertension, HLD, CVA (02/2022) who presented to the ED with chief complaints of altered mental status and shortness of breath. On Presentation, she was noted to be hypotensive, have severe metabolic acidosis and hyperglycemia, with a positive beta-hydroxybutarate.  Pt intubated from 08/08/2022 thru 08/10/2022.     Most recent chest x-ray (08/10/2022) revealed Mildly lower lung volumes with increased crowding of markings or  vascular congestion. But no overt edema. CT Head on 08/08/2022 revealed that Imaging is limited by motion artifact. Mild parenchymal volume loss is commensurate with the patient's age. Moderate periventricular white matter changes are present likely reflecting the  sequela of small vessel ischemia, stable since prior examination. No evidence of acute intracranial hemorrhage or infarct. No abnormal mass effect or midline shift. No abnormal intra  or extra-axial mass lesion.    Of note, pt recently admitted on 02/01/2022 for acute CVA. Outpatient MRI on 03/03/2022 revealed Punctate subacute infarct versus artifact within the posterior limb of left internal capsule. Chronic lacunar infarcts within the right caudate nucleus, right thalamus and left pons. Moderate chronic small vessel ischemic changes within the cerebral  white matter. Subcentimeter chronic infarcts within the left cerebellar hemisphere.      SLP Plan  Continue with current plan of care      Recommendations for follow up therapy are one component of a multi-disciplinary discharge planning process, led by the attending physician.  Recommendations may be updated based on patient status, additional functional criteria and insurance authorization.    Recommendations  Diet recommendations: Dysphagia 2 (fine chop);Thin liquid Liquids provided via: Straw Medication Administration: Whole meds with liquid Supervision: Staff to assist with self feeding;Intermittent supervision to cue for compensatory strategies;Full supervision/cueing for compensatory strategies Compensations: Minimize environmental distractions;Slow rate;Small sips/bites Postural Changes and/or Swallow Maneuvers: Seated upright 90 degrees                Oral Care Recommendations: Oral care BID Follow Up Recommendations: Skilled nursing-short term rehab (<3 hours/day) Assistance recommended at discharge: Frequent or constant Supervision/Assistance SLP Visit Diagnosis: Dysphagia, unspecified (R13.10) Plan: Continue with current plan of care         Jung Ingerson B. Rutherford Nail, M.S., CCC-SLP, Mining engineer Certified Brain Injury [place]  [place] Office [phone] Ascom [phone] Fax [phone]

## 2022-08-12 NOTE — Progress Notes (Signed)
PROGRESS NOTE    Carrie Kane  NGE:[id number] DOB: 11/25/1940 DOA: 08/08/2022 PCP: Cletis Athens, MD    Brief Narrative:  This 81 years old female with PMH significant of left displaced femoral neck fracture, T2DM, CAD with Hx of CABG X 3, PAD s/p revascularization in 2022, PSVT, Anemia, Hypothyroidism, hypertension, HLD, CVA (02/2022) who presented to the ED with chief complaints of altered mental status and shortness of breath. She was noted to be hypotensive, have severe metabolic acidosis and hyperglycemia.  He was admitted in the ICU for DKA and managed as per protocol.  She was also found to have elevated troponin cardiology was consulted, recommended left heart cath once renal functions improved.  She is off pressor support.  CT shows small PE.  She was intubated and eventually successfully extubated. TRH pickup 08/11/2022.  Assessment & Plan:   Principal Problem:   Acute metabolic encephalopathy Active Problems:   AKI (acute kidney injury) ([place])   Altered mental status   NSTEMI (non-ST elevated myocardial infarction) ([place])   Diabetic ketoacidosis without coma associated with type 2 diabetes mellitus ([place])   Sepsis with acute renal failure and septic shock ([facility])   PAF (paroxysmal atrial fibrillation) ([facility])  Acute metabolic encephalopathy likely multifactorial: Patient presented with DKA, shock and possible infection. Mental status is improving.  DKA resolved.  Acute hypoxic respiratory failure requiring mechanical ventilation: She is successfully extubated.   Continue supplemental oxygen and wean as tolerated.  NSTEMI: Patient had history of CAD with multiple PCI and CABG Cardiology is on the board , recommended to continue heparin gtt. Patient denies any chest pain.  Continue aspirin,  Lipitor. Echocardiogram shows LVEF 65 to 70%. Plan for left heart cath once renal functions improve and critical illness resolves. She is tentatively scheduled for left heart cath  08/13/2022   New onset A-fib: Heart rate is controlled, now in sinus rhythm. Continue heparin, plan for DOAC on discharge   Acute kidney injury in the setting of hypotension and DKA. Nephrology is following.  Serum creatinine improved, AKI resolved Avoid nephrotoxic medications No acute indication for dialysis at present. Nephrology signed off.  Diabetic ketoacidosis: Resolved.  Continue sliding scale.  Normochromic normocytic anemia without blood loss H&H remains stable.   Monitor H&H while on heparin.  Shock: Undifferentiated. > Resolved. Could be combination of sepsis, hypovolemia in the setting of DKA She required pressor support. Levophed subsequently discontinued. CT abdomen pelvis shows pericholecystic fluid Ultrasound abdomen negative for acute cholecystitis General surgery is consulted, if patient declines consider HIDA scan. Patient is improving with medical management. Continue cefepime and Flagyl.  General surgery signed off    DVT prophylaxis: Heparin Code Status: Full code Family Communication: No family at bedside Disposition Plan:   Status is: Inpatient Remains inpatient appropriate because: Admitted for multiple active issues which are recovering. Patient needs left heart cath given NSTEMI pending normalization of serum creatinine. Tentatively scheduled for left heart cath 08/13/2022   Consultants:  Cardiology Nephrology PCCM  Procedures: None Antimicrobials: Anti-infectives (From admission, onward)    Start     Dose/Rate Route Frequency Ordered Stop   08/12/22 1000  ceFEPIme (MAXIPIME) 2 g in sodium chloride 0.9 % 100 mL IVPB        2 g 200 mL/hr over 30 Minutes Intravenous Every 12 hours 08/12/22 0740 08/13/22 2359   08/08/22 2200  ceFEPIme (MAXIPIME) 2 g in sodium chloride 0.9 % 100 mL IVPB  Status:  Discontinued        2  g 200 mL/hr over 30 Minutes Intravenous Every 24 hours 08/08/22 1135 08/12/22 0740   08/08/22 1200  metroNIDAZOLE (FLAGYL)  IVPB 500 mg        500 mg 100 mL/hr over 60 Minutes Intravenous Every 8 hours 08/08/22 1105 08/13/22 2359   08/08/22 0745  ceFEPIme (MAXIPIME) 2 g in sodium chloride 0.9 % 100 mL IVPB  Status:  Discontinued        2 g 200 mL/hr over 30 Minutes Intravenous  Once 08/08/22 0649 08/08/22 0816   08/08/22 0745  vancomycin (VANCOREADY) IVPB 1500 mg/300 mL  Status:  Discontinued        1,500 mg 150 mL/hr over 120 Minutes Intravenous  Once 08/08/22 0649 08/08/22 0816   08/08/22 0300  ceFEPIme (MAXIPIME) 2 g in sodium chloride 0.9 % 100 mL IVPB        2 g 200 mL/hr over 30 Minutes Intravenous  Once 08/08/22 0249 08/08/22 0326   08/08/22 0300  vancomycin (VANCOREADY) IVPB 1500 mg/300 mL        1,500 mg 150 mL/hr over 120 Minutes Intravenous  Once 08/08/22 0249 08/08/22 4332       Subjective: Patient was seen and examined at bedside. Overnight events noted. Patient appears much improved.  She reports feeling better. She denies any chest pain or shortness of breath.   Objective: Vitals:   08/11/22 2020 08/12/22 0423 08/12/22 0500 08/12/22 0952  BP: (!) 149/65 (!) 151/64  (!) 151/69  Pulse: 78 78  79  Resp: '14 16  18  '$ Temp: 98.3 F (36.8 C) 98 F (36.7 C)  98.7 F (37.1 C)  TempSrc:  Oral  Oral  SpO2: 100% 97%  97%  Weight:   73.1 kg   Height:        Intake/Output Summary (Last 24 hours) at 08/12/2022 1050 Last data filed at 08/12/2022 1035 Gross per 24 hour  Intake 141.91 ml  Output 975 ml  Net -833.09 ml   Filed Weights   08/10/22 0447 08/11/22 0431 08/12/22 0500  Weight: 72.6 kg 71.9 kg 73.1 kg    Examination:  General exam: Appears comfortable, NAD, very deconditioned. Respiratory system: CTA bilaterally, respiratory effort normal, RR 16. Cardiovascular system: S1-S2 heard, regular rate and rhythm, no murmur. Gastrointestinal system: Abdomen is soft, non tender, non distended, BS+ Central nervous system: Alert and oriented x 3. No focal neurological  deficits. Extremities: No edema, no cyanosis, no clubbing Skin: No rashes, lesions or ulcers Psychiatry: Judgement and insight appear normal. Mood & affect appropriate.     Data Reviewed: I have personally reviewed following labs and imaging studies  CBC: Recent Labs  Lab 08/08/22 0210 08/08/22 0339 08/09/22 0526 08/10/22 0420 08/11/22 0420 08/12/22 0533  WBC 19.0* 21.1* 19.2* 16.8* 8.8 7.8  NEUTROABS 14.1* 16.0*  --   --   --   --   HGB 9.8* 8.9* 9.6* 10.0* 9.1* 8.7*  HCT 34.8* 30.2* 28.6* 30.6* 28.2* 26.3*  MCV 105.8* 99.3 89.9 90.5 92.2 92.3  PLT 214 195 188 173 131* 951*   Basic Metabolic Panel: Recent Labs  Lab 08/08/22 0513 08/08/22 1047 08/08/22 2012 08/09/22 0526 08/10/22 0420 08/11/22 0420 08/12/22 0533  NA  --    < > 136 140 139 142 143  K  --    < > 3.6 3.2* 4.2 3.5 3.4*  CL  --    < > 97* 97* 100 102 106  CO2  --    < > 30 33*  31 32 28  GLUCOSE  --    < > 119* 157* 142* 104* 103*  BUN  --    < > 33* 35* 41* 37* 29*  CREATININE  --    < > 1.68* 1.93* 2.33* 1.56* 1.03*  CALCIUM  --    < > 8.4* 8.3* 8.0* 8.5* 8.3*  MG 2.1  --   --   --  1.7 1.9 1.9  PHOS 8.1*  --   --   --  3.3 2.9 2.2*   < > = values in this interval not displayed.   GFR: Estimated Creatinine Clearance: 44.7 mL/min (A) (by C-G formula based on SCr of 1.03 mg/dL (H)). Liver Function Tests: Recent Labs  Lab 08/08/22 0210 08/09/22 0747 08/10/22 0420 08/11/22 0420 08/12/22 0533  AST 105* 267* 169* 124* 81*  ALT 38 92* 81* 68* 55*  ALKPHOS 88 70 80 81 79  BILITOT 1.8* 0.5 0.5 0.8 1.1  PROT 6.6 5.0* 5.2* 4.9* 4.8*  ALBUMIN 3.9 2.7* 2.6* 2.4* 2.4*   No results for input(s): "LIPASE", "AMYLASE" in the last 168 hours. No results for input(s): "AMMONIA" in the last 168 hours. Coagulation Profile: Recent Labs  Lab 08/08/22 0210 08/09/22 0745  INR 1.2 1.2   Cardiac Enzymes: No results for input(s): "CKTOTAL", "CKMB", "CKMBINDEX", "TROPONINI" in the last 168 hours. BNP (last 3  results) No results for input(s): "PROBNP" in the last 8760 hours. HbA1C: No results for input(s): "HGBA1C" in the last 72 hours. CBG: Recent Labs  Lab 08/11/22 1641 08/11/22 2018 08/12/22 0007 08/12/22 0445 08/12/22 0946  GLUCAP 96 91 94 107* 119*   Lipid Profile: No results for input(s): "CHOL", "HDL", "LDLCALC", "TRIG", "CHOLHDL", "LDLDIRECT" in the last 72 hours.  Thyroid Function Tests: No results for input(s): "TSH", "T4TOTAL", "FREET4", "T3FREE", "THYROIDAB" in the last 72 hours. Anemia Panel: No results for input(s): "VITAMINB12", "FOLATE", "FERRITIN", "TIBC", "IRON", "RETICCTPCT" in the last 72 hours. Sepsis Labs: Recent Labs  Lab 08/08/22 0210 08/08/22 1025 08/09/22 0526 08/09/22 0745 08/10/22 0420  PROCALCITON 0.99  --  7.02  --  5.52  LATICACIDVEN >9.0* >9.0*  --  2.8*  --     Recent Results (from the past 240 hour(s))  Blood Culture (routine x 2)     Status: None (Preliminary result)   Collection Time: 08/08/22  2:10 AM   Specimen: BLOOD RIGHT ARM  Result Value Ref Range Status   Specimen Description BLOOD RIGHT ARM  Final   Special Requests   Final    BOTTLES DRAWN AEROBIC AND ANAEROBIC Blood Culture adequate volume   Culture   Final    NO GROWTH 4 DAYS Performed at [facility], [address], [place], [place] [address]    Report Status PENDING  Incomplete  Blood Culture (routine x 2)     Status: None (Preliminary result)   Collection Time: 08/08/22  2:10 AM   Specimen: BLOOD  Result Value Ref Range Status   Specimen Description BLOOD BLOOD LEFT ARM  Final   Special Requests   Final    BOTTLES DRAWN AEROBIC AND ANAEROBIC Blood Culture adequate volume   Culture   Final    NO GROWTH 4 DAYS Performed at [facility], [address], [place], [place] [address]    Report Status PENDING  Incomplete  Urine Culture     Status: None   Collection Time: 08/08/22  2:10 AM   Specimen: In/Out Cath Urine  Result Value Ref Range Status  Specimen Description   Final    IN/OUT CATH URINE Performed at [facility], [address] [place], [place] [address]    Special Requests   Final    NONE Performed at [facility], [address] [place], [place] [address]    Culture   Final    NO GROWTH Performed at [facility], [place] [address] [place], [place] [address]    Report Status 08/09/2022 FINAL  Final  SARS Coronavirus 2 by RT PCR (hospital order, performed in [facility] hospital lab) *cepheid single result test* Anterior Nasal Swab     Status: None   Collection Time: 08/08/22  5:10 AM   Specimen: Anterior Nasal Swab  Result Value Ref Range Status   SARS Coronavirus 2 by RT PCR NEGATIVE NEGATIVE Final    Comment: (NOTE) SARS-CoV-2 target nucleic acids are NOT DETECTED.  The SARS-CoV-2 RNA is generally detectable in upper and lower respiratory specimens during the acute phase of infection. The lowest concentration of SARS-CoV-2 viral copies this assay can detect is 250 copies / mL. A negative result does not preclude SARS-CoV-2 infection and should not be used as the sole basis for treatment or other patient management decisions.  A negative result may occur with improper specimen collection / handling, submission of specimen other than nasopharyngeal swab, presence of viral mutation(s) within the areas targeted by this assay, and inadequate number of viral copies (<250 copies / mL). A negative result must be combined with clinical observations, patient history, and epidemiological information.  Fact Sheet for Patients:   https://www.patel.info/  Fact Sheet for Healthcare Providers: https://hall.com/  This test is not yet approved or  cleared by the [place] FDA and has been authorized for detection and/or diagnosis of SARS-CoV-2 by FDA under an Emergency Use Authorization (EUA).  This EUA will remain in effect (meaning this test can  be used) for the duration of the COVID-19 declaration under Section 564(b)(1) of the Act, 21 U.S.C. section 360bbb-3(b)(1), unless the authorization is terminated or revoked sooner.  Performed at [facility], [address] [place], [place] [address]   MRSA Next Gen by PCR, Nasal     Status: None   Collection Time: 08/08/22  5:10 AM   Specimen: Anterior Nasal Swab  Result Value Ref Range Status   MRSA by PCR Next Gen NOT DETECTED NOT DETECTED Final    Comment: (NOTE) The GeneXpert MRSA Assay (FDA approved for NASAL specimens only), is one component of a comprehensive MRSA colonization surveillance program. It is not intended to diagnose MRSA infection nor to guide or monitor treatment for MRSA infections. Test performance is not FDA approved in patients less than 6 years old. Performed at [facility], [address] [place], [place] [address]   Culture, Respiratory w Gram Stain     Status: None   Collection Time: 08/09/22  7:54 AM   Specimen: Tracheal Aspirate; Respiratory  Result Value Ref Range Status   Specimen Description   Final    TRACHEAL ASPIRATE Performed at [facility], [address] [place], [place] [address]    Special Requests   Final    NONE Performed at [facility], [address] [place], [place] [address]    Gram Stain   Final    FEW WBC PRESENT,BOTH PMN AND MONONUCLEAR RARE GRAM POSITIVE COCCI IN CLUSTERS RARE BUDDING YEAST SEEN Performed at [facility], [place] [address], [place],  [address]  Culture FEW CANDIDA TROPICALIS  Final   Report Status 08/11/2022 FINAL  Final  Respiratory (~20 pathogens) panel by PCR     Status: None   Collection Time: 08/09/22  7:54 AM   Specimen: Nasopharyngeal Swab; Respiratory  Result Value Ref Range Status   Adenovirus NOT DETECTED NOT DETECTED Final   Coronavirus 229E NOT DETECTED NOT DETECTED Final    Comment: (NOTE) The Coronavirus on the Respiratory  Panel, DOES NOT test for the novel  Coronavirus (2019 nCoV)    Coronavirus HKU1 NOT DETECTED NOT DETECTED Final   Coronavirus NL63 NOT DETECTED NOT DETECTED Final   Coronavirus OC43 NOT DETECTED NOT DETECTED Final   Metapneumovirus NOT DETECTED NOT DETECTED Final   Rhinovirus / Enterovirus NOT DETECTED NOT DETECTED Final   Influenza A NOT DETECTED NOT DETECTED Final   Influenza B NOT DETECTED NOT DETECTED Final   Parainfluenza Virus 1 NOT DETECTED NOT DETECTED Final   Parainfluenza Virus 2 NOT DETECTED NOT DETECTED Final   Parainfluenza Virus 3 NOT DETECTED NOT DETECTED Final   Parainfluenza Virus 4 NOT DETECTED NOT DETECTED Final   Respiratory Syncytial Virus NOT DETECTED NOT DETECTED Final   Bordetella pertussis NOT DETECTED NOT DETECTED Final   Bordetella Parapertussis NOT DETECTED NOT DETECTED Final   Chlamydophila pneumoniae NOT DETECTED NOT DETECTED Final   Mycoplasma pneumoniae NOT DETECTED NOT DETECTED Final    Comment: Performed at [facility], [place], [place], [place] [address]    Radiology Studies: No results found.  Scheduled Meds:  aspirin  81 mg Per Tube Daily   Chlorhexidine Gluconate Cloth  6 each Topical Daily   dextrose  1 ampule Intravenous Once   insulin aspart  0-9 Units Subcutaneous Q4H   insulin glargine-yfgn  8 Units Subcutaneous Daily   mouth rinse  15 mL Mouth Rinse Q6H   pantoprazole (PROTONIX) IV  40 mg Intravenous QHS   potassium chloride  40 mEq Per Tube Once   Continuous Infusions:  ceFEPime (MAXIPIME) IV 2 g (08/12/22 1005)   heparin 850 Units/hr (08/12/22 0644)   magnesium sulfate bolus IVPB     metronidazole 500 mg (08/12/22 0518)   potassium PHOSPHATE IVPB (in mmol)       LOS: 4 days    Time spent: 35 mins    Dacey Milberger, MD [facility]   If 7PM-7AM, please contact night-coverage

## 2022-08-12 NOTE — Progress Notes (Signed)
Occupational Therapy Treatment Patient Details Name: Carrie Kane MRN: [id number] DOB: June 21, 1941 Today's Date: 08/12/2022   History of present illness 81 y.o female with significant PMH of Left displaced femoral neck fracture, T2DM, CAD with Hx of CABG X 3, PAD s/p revascularization 2022, PSVT, Anemia, Hypothyroidism,hypertension, HLD, CVA (02/2022) who presented to the ED with chief complaints of altered mental status and shortness of breath. On Presentation, she was noted to be hypotensive, have severe metabolic acidosis and hyperglycemia, with a positive beta-hydroxybutarate.  Pt intubated from 08/08/2022 thru 08/10/2022.   OT comments  Carrie Kane was seen for OT treatment on this date. Upon arrival to room pt reclined in bed, lunch tray untouched,  agreeable to sit EOB for lunch with encouragement. Pt oriented to self and location only, states month as April and repeatedly states "what happened to me, why do I feel bad?" Pt requires SUPERVISION self-feeding and grooming seated EOB, tolerates ~ 30 min, intermittelty drops items R more frequently than L. Completes x2 sit<>stand from elevated bed with MIN A + RW, tolerates 2 side steps. Pt making good progress toward goals, will continue to follow POC. Discharge recommendation remains appropriate.     Recommendations for follow up therapy are one component of a multi-disciplinary discharge planning process, led by the attending physician.  Recommendations may be updated based on patient status, additional functional criteria and insurance authorization.    Follow Up Recommendations  Skilled nursing-short term rehab (<3 hours/day)    Assistance Recommended at Discharge Frequent or constant Supervision/Assistance  Patient can return home with the following  Two people to help with walking and/or transfers;Two people to help with bathing/dressing/bathroom   Equipment Recommendations  Other (comment) (defer)    Recommendations for Other  Services      Precautions / Restrictions Precautions Precautions: Fall Restrictions Weight Bearing Restrictions: No       Mobility Bed Mobility Overal bed mobility: Needs Assistance Bed Mobility: Sit to Supine, Supine to Sit     Supine to sit: Mod assist Sit to supine: Mod assist        Transfers Overall transfer level: Needs assistance Equipment used: Rolling walker (2 wheels) Transfers: Sit to/from Stand Sit to Stand: Min assist, From elevated surface                 Balance Overall balance assessment: Needs assistance Sitting-balance support: No upper extremity supported, Feet supported Sitting balance-Leahy Scale: Fair     Standing balance support: Bilateral upper extremity supported Standing balance-Leahy Scale: Fair                             ADL either performed or assessed with clinical judgement   ADL Overall ADL's : Needs assistance/impaired                                       General ADL Comments: SUPERVISION self-feeding and grooming seated EOB, tolerates ~ 30 min, intermittelty drops items R more frequently than L.      Cognition Arousal/Alertness: Awake/alert Behavior During Therapy: Flat affect, WFL for tasks assessed/performed Overall Cognitive Status: No family/caregiver present to determine baseline cognitive functioning                                 General Comments: recalls therapy sessiOn  this AM (standing)  However repeatedly asks "what happened why am i here?"              General Comments Pt continues to be very debilitated, but was much more able to participate with mobility today    Pertinent Vitals/ Pain       Pain Assessment Pain Assessment: No/denies pain   Frequency  Min 2X/week        Progress Toward Goals  OT Goals(current goals can now be found in the care plan section)  Progress towards OT goals: Progressing toward goals  Acute Rehab OT Goals Patient  Stated Goal: to go  home OT Goal Formulation: With patient Time For Goal Achievement: 08/25/22 Potential to Achieve Goals: Good ADL Goals Pt Will Perform Grooming: sitting;with supervision;with set-up Pt Will Perform Lower Body Dressing: with min assist;sit to/from stand Pt Will Transfer to Toilet: with min assist;bedside commode;stand pivot transfer  Plan Discharge plan remains appropriate;Frequency remains appropriate    Co-evaluation                 AM-PAC OT "6 Clicks" Daily Activity     Outcome Measure   Help from another person eating meals?: A Little Help from another person taking care of personal grooming?: A Lot Help from another person toileting, which includes using toliet, bedpan, or urinal?: A Lot Help from another person bathing (including washing, rinsing, drying)?: A Lot Help from another person to put on and taking off regular upper body clothing?: A Little Help from another person to put on and taking off regular lower body clothing?: A Lot 6 Click Score: 14    End of Session    OT Visit Diagnosis: Other abnormalities of gait and mobility (R26.89);Muscle weakness (generalized) (M62.81)   Activity Tolerance Patient tolerated treatment well   Patient Left in bed;with call bell/phone within reach;with nursing/sitter in room   Nurse Communication          Time: 7253-6644 OT Time Calculation (min): 45 min  Charges: OT General Charges $OT Visit: 1 Visit OT Treatments $Self Care/Home Management : 38-52 mins  Dessie Coma, M.S. OTR/L  08/12/22, 2:53 PM  ascom [phone]

## 2022-08-12 NOTE — Progress Notes (Signed)
Physical Therapy Treatment Patient Details Name: Carrie Kane MRN: [id number] DOB: [birth year] Today's Date: 08/12/2022   History of Present Illness 81 y.o female with significant PMH of Left displaced femoral neck fracture, T2DM, CAD with Hx of CABG X 3, PAD s/p revascularization 2022, PSVT, Anemia, Hypothyroidism,hypertension, HLD, CVA (02/2022) who presented to the ED with chief complaints of altered mental status and shortness of breath. On Presentation, she was noted to be hypotensive, have severe metabolic acidosis and hyperglycemia, with a positive beta-hydroxybutarate.  Pt intubated from 08/08/2022 thru 08/10/2022.    PT Comments    Pt showed good overall effort, though her demeanor remains flat and at times she needed multiple reinforcing cues to follow up with directions.  Pt remains very weak, but given how profound this was on the eval yesterday she did surprisingly well.   She was able to do multiple standing efforts (elevated bed, mod assist to attain) but could do a little weight shifting/side stepping/marching in place with heavy UE use/reliance - pt did fatigue significantly with the effort.  Recommendations for follow up therapy are one component of a multi-disciplinary discharge planning process, led by the attending physician.  Recommendations may be updated based on patient status, additional functional criteria and insurance authorization.  Follow Up Recommendations  Skilled nursing-short term rehab (<3 hours/day) Can patient physically be transported by private vehicle: No   Assistance Recommended at Discharge Frequent or constant Supervision/Assistance  Patient can return home with the following Two people to help with walking and/or transfers;A lot of help with bathing/dressing/bathroom;Assistance with cooking/housework;Assist for transportation;Help with stairs or ramp for entrance   Equipment Recommendations   (TBD)    Recommendations for Other Services        Precautions / Restrictions Precautions Precautions: Fall Restrictions Weight Bearing Restrictions: No     Mobility  Bed Mobility Overal bed mobility: Needs Assistance Bed Mobility: Sit to Supine, Supine to Sit     Supine to sit: Mod assist Sit to supine: Mod assist   General bed mobility comments: Pt did better with mobility today, she still needed assist with LEs getting out and then back into bed    Transfers Overall transfer level: Needs assistance Equipment used: Rolling walker (2 wheels) Transfers: Sit to/from Stand Sit to Stand: Mod assist, From elevated surface           General transfer comment: Without assist, pt was unable to get hips off bed at all, even with plenty of cuing and elevated bed.  However with reinforcement and mod assist she was able to attain/then maintain standing x 2 t/o the session.  Heavy UE reliance, assist to keep weight forward initially until PT was able to get her to really lean over the walker    Ambulation/Gait Ambulation/Gait assistance: Mod assist Gait Distance (Feet): 3 Feet Assistive device: Rolling walker (2 wheels)         General Gait Details: Very close supervision to mod assist t/o the effort.  She was able to do some effortful side stepping along EOB, a few marching in steps reps.  On second standing effort we were able to take ~3 small forward (and some unsteady, shuffling backward steps) - though pt is not steady enough to attempt prolonged ambulation away from the bed at this time.   Stairs             Wheelchair Mobility    Modified Rankin (Stroke Patients Only)       Balance Overall balance assessment: Needs  assistance Sitting-balance support: Feet supported, Bilateral upper extremity supported Sitting balance-Leahy Scale: Fair Sitting balance - Comments: Pt was able to att maintain sitting balance much better today, at times briefly w/o UEs   Standing balance support: Bilateral upper extremity  supported Standing balance-Leahy Scale: Poor Standing balance comment: highly reliant on the walker, able to maintain standing with knees locked, but UE reliant during any weight shifting/stepping                            Cognition Arousal/Alertness: Awake/alert Behavior During Therapy: Flat affect, WFL for tasks assessed/performed                                            Exercises General Exercises - Lower Extremity Ankle Circles/Pumps: Strengthening, 10 reps Heel Slides: AROM, 10 reps (lightly resisted leg ext) Hip ABduction/ADduction: AROM, 10 reps, AAROM (AAROM on R, AROM on L) Straight Leg Raises: AROM, AAROM Hip Flexion/Marching: AROM, AAROM, 10 reps (AAROM on R, AROM on L) Heel Raises: AROM, 5 reps, Standing (heavy UE/walker reliance)    General Comments General comments (skin integrity, edema, etc.): Pt continues to be very debilitated, but was much more able to participate with mobility today      Pertinent Vitals/Pain Pain Assessment Pain Assessment: No/denies pain    Home Living                          Prior Function            PT Goals (current goals can now be found in the care plan section) Progress towards PT goals: Progressing toward goals    Frequency    Min 2X/week      PT Plan Current plan remains appropriate    Co-evaluation              AM-PAC PT "6 Clicks" Mobility   Outcome Measure  Help needed turning from your back to your side while in a flat bed without using bedrails?: A Lot Help needed moving from lying on your back to sitting on the side of a flat bed without using bedrails?: A Lot Help needed moving to and from a bed to a chair (including a wheelchair)?: A Lot Help needed standing up from a chair using your arms (e.g., wheelchair or bedside chair)?: A Lot Help needed to walk in hospital room?: Total Help needed climbing 3-5 steps with a railing? : Total 6 Click Score: 10     End of Session   Activity Tolerance: Patient tolerated treatment well;Patient limited by fatigue Patient left: with bed alarm set;with call bell/phone within reach Nurse Communication: Mobility status PT Visit Diagnosis: Muscle weakness (generalized) (M62.81);Difficulty in walking, not elsewhere classified (R26.2);Unsteadiness on feet (R26.81)     Time: 6294-7654 PT Time Calculation (min) (ACUTE ONLY): 24 min  Charges:  $Therapeutic Exercise: 8-22 mins $Therapeutic Activity: 8-22 mins                     Kreg Shropshire, DPT 08/12/2022, 1:16 PM

## 2022-08-12 NOTE — Consult Note (Signed)
[place] for Electrolyte Monitoring and Replacement   Recent Labs: Potassium (mmol/L)  Date Value  08/12/2022 3.4 (L)   Magnesium (mg/dL)  Date Value  08/12/2022 1.9   Calcium (mg/dL)  Date Value  08/12/2022 8.3 (L)   Albumin (g/dL)  Date Value  08/12/2022 2.4 (L)  07/15/2022 3.9   Phosphorus (mg/dL)  Date Value  08/12/2022 2.2 (L)   Sodium (mmol/L)  Date Value  08/12/2022 143  07/15/2022 138   Corrected Ca: 9.8 mg/dL  Assessment: Pharmacy has been consulted to monitor and replace electrolytes in 81yo female admitted with acute respiratory failure, AKI, DKA, CAD (NSTEMI / CABG), Afib, and lactic acidosis. Patient was transferred to ICU intubated.  Goal of Therapy:  Electrolytes WNL; K>4, Mg >2  Plan:  AKI improving. Scr 1.56>1.03; but slowed UOP 0.7>0.75m/k/h K: 3.5>3.4 - replace with KCL 466m PO x1 Mg 1.9 - replace with MgSO 1g IV x1 Other electrolytes WNL, no further replacement warranted for today. Recheck electrolytes in AM  BrLorna Dibble Pharmacy Student 08/12/2022 7:33 AM

## 2022-08-12 NOTE — Progress Notes (Signed)
Nutrition Follow-up  DOCUMENTATION CODES:   Non-severe (moderate) malnutrition in context of chronic illness  INTERVENTION:   Ensure Enlive po TID, each supplement provides 350 kcal and 20 grams of protein.  MVI po daily   Pt at high refeed risk; recommend monitor potassium, magnesium and phosphorus labs daily until stable  NUTRITION DIAGNOSIS:   Moderate Malnutrition related to chronic illness (CVA) as evidenced by mild fat depletion, mild muscle depletion, percent weight loss. -ongoing   GOAL:   Patient will meet greater than or equal to 90% of their needs -not met   MONITOR:   PO intake, Supplement acceptance, Labs, Weight trends, Skin, I & O's  ASSESSMENT:   81 y/o female with h/o hip fracture, PAD s/p revascularization 2022, NSTEMI, PAF, DM, CAD s/p CABG x 3, HLD, hirsutism, IDA, HTN, hypothroidism and CVA who is admitted with septic shock and DKA.  Pt seen by SLP and advanced to a dysphagia 2/thin liquid diet. RD will add supplements and MVI to help pt meet her estimated needs. Per chart, pt is weight stable since admission. No BM since admission; MD aware.   Medications reviewed and include: aspirin, insulin, protonix, cefepime, heparin, metronidazole, Kphos   Labs reviewed: K 3.4(L), BUN 29(H), creat 1.03(H), P 2.2(L),  Mg 1.9 wnl Hgb 8.7(L), Hct 26.3(L) Cbgs- 139, 119, 107, 94 x 24 hrs   Diet Order:   Diet Order             DIET DYS 2 Room service appropriate? Yes; Fluid consistency: Thin  Diet effective now                  EDUCATION NEEDS:   Not appropriate for education at this time  Skin:  Skin Assessment: Reviewed RN Assessment  Last BM:  pta  Height:   Ht Readings from Last 1 Encounters:  08/08/22 '5\' 8"'  (1.727 m)    Weight:   Wt Readings from Last 1 Encounters:  08/12/22 73.1 kg    Ideal Body Weight:  63.6 kg  BMI:  Body mass index is 24.5 kg/m.  Estimated Nutritional Needs:   Kcal:  1700-1900kcal/day  Protein:   85-95g/day  Fluid:  1.6-1.8L/day  Koleen Distance MS, RD, LDN Please refer to [facility] for RD and/or RD on-call/weekend/after hours pager

## 2022-08-12 NOTE — Progress Notes (Signed)
Rounding Note    Patient Name: Carrie Kane Date of Encounter: 08/12/2022  [place] Cardiologist: Ida Rogue, MD   Subjective   Patient denies chest pain and shortness of breath. She is A&O x1. kidney function improving. BP is good. Remains in NSR on exam.   Inpatient Medications    Scheduled Meds:  aspirin  81 mg Per Tube Daily   Chlorhexidine Gluconate Cloth  6 each Topical Daily   dextrose  1 ampule Intravenous Once   insulin aspart  0-9 Units Subcutaneous Q4H   insulin glargine-yfgn  8 Units Subcutaneous Daily   mouth rinse  15 mL Mouth Rinse Q6H   pantoprazole (PROTONIX) IV  40 mg Intravenous QHS   potassium chloride  40 mEq Per Tube Once   Continuous Infusions:  ceFEPime (MAXIPIME) IV     heparin 850 Units/hr (08/12/22 0644)   magnesium sulfate bolus IVPB     metronidazole 500 mg (08/12/22 0518)   potassium PHOSPHATE IVPB (in mmol)     PRN Meds: dextrose, ondansetron (ZOFRAN) IV, mouth rinse, polyethylene glycol   Vital Signs    Vitals:   08/11/22 1817 08/11/22 2020 08/12/22 0423 08/12/22 0500  BP: (!) 146/60 (!) 149/65 (!) 151/64   Pulse: 80 78 78   Resp: '17 14 16   '$ Temp: 98.6 F (37 C) 98.3 F (36.8 C) 98 F (36.7 C)   TempSrc: Oral  Oral   SpO2: 98% 100% 97%   Weight:    73.1 kg  Height:        Intake/Output Summary (Last 24 hours) at 08/12/2022 0945 Last data filed at 08/11/2022 1600 Gross per 24 hour  Intake 148.91 ml  Output 225 ml  Net -76.09 ml      08/12/2022    5:00 AM 08/11/2022    4:31 AM 08/10/2022    4:47 AM  Last 3 Weights  Weight (lbs) 161 lb 2.5 oz 158 lb 8.2 oz 160 lb 0.9 oz  Weight (kg) 73.1 kg 71.9 kg 72.6 kg      Telemetry    N/A - Personally Reviewed  ECG    No new - Personally Reviewed  Physical Exam   GEN: No acute distress.   Neck: No JVD Cardiac: RRR, no murmurs, rubs, or gallops.  Respiratory: Clear to auscultation bilaterally. GI: Soft, nontender, non-distended  MS: minimal  lower edema; No deformity. Neuro:  Nonfocal  Psych: Normal affect   Labs    High Sensitivity Troponin:   Recent Labs  Lab 08/08/22 0210 08/08/22 0513 08/09/22 0745 08/09/22 1048 08/10/22 0639  TROPONINIHS 3,038* 15,838* >24,000* >24,000* >24,000*     Chemistry Recent Labs  Lab 08/10/22 0420 08/11/22 0420 08/12/22 0533  NA 139 142 143  K 4.2 3.5 3.4*  CL 100 102 106  CO2 31 32 28  GLUCOSE 142* 104* 103*  BUN 41* 37* 29*  CREATININE 2.33* 1.56* 1.03*  CALCIUM 8.0* 8.5* 8.3*  MG 1.7 1.9 1.9  PROT 5.2* 4.9* 4.8*  ALBUMIN 2.6* 2.4* 2.4*  AST 169* 124* 81*  ALT 81* 68* 55*  ALKPHOS 80 81 79  BILITOT 0.5 0.8 1.1  GFRNONAA 21* 34* 55*  ANIONGAP '8 8 9    '$ Lipids  Recent Labs  Lab 08/09/22 0526  TRIG 64    Hematology Recent Labs  Lab 08/10/22 0420 08/11/22 0420 08/12/22 0533  WBC 16.8* 8.8 7.8  RBC 3.38* 3.06* 2.85*  HGB 10.0* 9.1* 8.7*  HCT 30.6* 28.2* 26.3*  MCV  90.5 92.2 92.3  MCH 29.6 29.7 30.5  MCHC 32.7 32.3 33.1  RDW 14.8 14.9 14.5  PLT 173 131* 137*   Thyroid  Recent Labs  Lab 08/08/22 0513  TSH 0.272*  FREET4 1.40*    BNP Recent Labs  Lab 08/08/22 0210  BNP 261.2*    DDimer No results for input(s): "DDIMER" in the last 168 hours.   Radiology    No results found.  Cardiac Studies     2D echo 08/08/2022:  1. Left ventricular ejection fraction, by estimation, is 65 to 70%. The  left ventricle has normal function. The left ventricle has no regional  wall motion abnormalities. There is moderate asymmetric left ventricular  hypertrophy of the septal segment.  Left ventricular diastolic parameters are consistent with Grade I  diastolic dysfunction (impaired relaxation). Elevated left ventricular  end-diastolic pressure.   2. Right ventricular systolic function is normal. The right ventricular  size is normal. There is mildly elevated pulmonary artery systolic  pressure.   3. Left atrial size was mildly dilated.   4. The mitral  valve is normal in structure. Trivial mitral valve  regurgitation. No evidence of mitral stenosis.   5. Tricuspid valve regurgitation is mild to moderate.   6. The aortic valve is tricuspid. Aortic valve regurgitation is not  visualized. No aortic stenosis is present.   7. Aortic dilatation noted. There is mild dilatation of the ascending  aorta, measuring 37 mm.   8. The inferior vena cava is dilated in size with <50% respiratory  variability, suggesting right atrial pressure of 15 mmHg.  Patient Profile     81 y.o. female with history of CAD s/p CABG in 2018, HFpEF, CVA in 01/2022, SVT, NSVT, PAD s/p PTA, DM, HTN, and HLD who was admitted with acute hypoxic respiratory failure requiring mechanical ventilation with encephalopathy in the setting of DKA with metabolic derangement, undifferentiated shock and sepsis of unclear etiology and we are seeing for NSTEMI and new onset Afib.   Assessment & Plan    CAD s/p CABG NSTEMI - HS troponin elevated >24,000 in the setting of acute hypoxic respiratory failure requiring mechanical ventilation, AKI, anemia, SEPSIS - EKG with no ischemic changes - she was extubated 10/24. DKA has resolved, still some confusion during exam - continue IV heparin - continue Aspirin, restart Lipitor. Can start BB therapy - Echo showed normal LVEF - Plan for cath once overall status improves, kidney function is improving. Consider heart cath tomorrow.  New onset Afib - noted to have paroxysmal Afib - In NSR - CHADSVASC of 9 - IV heparin>she will need OAC at discharge  Acute hypoxic respiratory failure with encephalopathy - in the setting of DKA and metabolic derangement, shock and sepsis - extubated 10/24 - no longer requiring vasopressor - per CCM  HLD/PAD - resume statin when able Anemia  AKI - likely ATN in the setting of hypotension requiring vasopressor - improving  Hyperthyroidism - in the setting of acute illness - possible contributing to  Afib  For questions or updates, please contact [place] Please consult [email or web address] for contact info under        Signed, Ingvald Theisen Ninfa Meeker, PA-C  08/12/2022, 9:45 AM

## 2022-08-12 NOTE — Progress Notes (Signed)
ANTICOAGULATION CONSULT NOTE  Pharmacy Consult for heparin infusion Indication: NSTEMI  Allergies  Allergen Reactions   Shellfish Allergy Anaphylaxis    Throat swelling   Flexeril [Cyclobenzaprine] Hypertension    Patient Measurements: Height: '5\' 8"'$  (172.7 cm) Weight: 73.1 kg (161 lb 2.5 oz) IBW/kg (Calculated) : 63.9 Heparin Dosing Weight: 63.5 kg  Vital Signs: Temp: 98.7 F (37.1 C) (10/26 0952) Temp Source: Oral (10/26 0952) BP: 151/69 (10/26 0952) Pulse Rate: 79 (10/26 0952)  Labs: Recent Labs    08/10/22 0420 08/10/22 0639 08/11/22 0420 08/12/22 0533 08/12/22 1509  HGB 10.0*  --  9.1* 8.7*  --   HCT 30.6*  --  28.2* 26.3*  --   PLT 173  --  131* 137*  --   HEPARINUNFRC 0.70  --  0.34 0.16* 0.40  CREATININE 2.33*  --  1.56* 1.03*  --   TROPONINIHS  --  >24,000*  --   --   --      Estimated Creatinine Clearance: 44.7 mL/min (A) (by C-G formula based on SCr of 1.03 mg/dL (H)).   Medical History: Past Medical History:  Diagnosis Date   CAD (coronary artery disease)    a. 04/2017 Cath: LM 62, LAD 80p, 3m LCX 95ost, 774mEF 45-50%; b. 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   Diastolic dysfunction    a. 04/2017 Echo: EF 55-60%, no rwma, Gr1 DD, mildly dil LA; b. 08/2020 Echo: EF 55-60%, no rwma, GrII DD, nl RV fxn, RVSP 3065m, mod dil LA, mildly dil RA, Mod TR.   Hyperlipidemia    Hypertensive heart disease    Insulin dependent diabetes mellitus    PAD (peripheral artery disease) ([facility]  a. 02/2021 PTA/DCBA R Peroneal, R Popliteal, distal R SFA.   PSVT (paroxysmal supraventricular tachycardia)    a. 02/2022 Zio: Predominantly sinus rhythm @ 61 (36-218). 2 NSVT runs (fastest/longest 6 beats @ 218). 37 SVT/A tach runs (fastest 185 x 5 beats, longest 32.4 secs @ 107). Triggered events = RSR, PAC.   [facility]  a. 01/2022 R sided wkns/aphasia/tremor/slurred speech-->Ss resolved; b. 02/2022 MRI/A: punctate subacute inf vs artifact-post limb of L int  capsule. 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 Mod, chronic small vessel isch changes within the cerebral white matter.  Subcm chronic infarct- L cerebellar hemisphere.  Sev dzs prox P2 seg of R PCA. Mod dzs A1 R antClinical cytogeneticist  Assessment: Pt is a [age] female presenting to ED w/ AMS, found with elevated Troponin I level, being treated for NSTEMI.  Goal of Therapy:  INR 2-3 Monitor platelets by anticoagulation protocol: Yes   Date/Time  HL Rate 10/22'@1439'$   0.62 750 units/hr, therapeutic x1 10/22 2242  0.68 Therapeutic x 2 10/23 0526  0.63 Therapeutic x 3 10/24 0420  0.70 Therapeutic x 4 10/25 0420  0.34 Therapeutic x 5 10/26 0533  0.16 Sub-therapeutic , 750 un->850 units 10/26 1509  0.40 Therapeutic x 1  Plan:  Continue heparin infusion at 850 units/hr Will recheck HL in 8 hours to confirm therapeutic rate CBC daily while on heparin  Thoren Hosang Rodriguez-Guzman PharmD, BCPS 08/12/2022 4:08 PM

## 2022-08-12 NOTE — Care Management Important Message (Signed)
Important Message  Patient Details  Name: Carrie Kane MRN: [id number] Date of Birth: 30-Aug-1941   Medicare Important Message Given:  Yes     Dannette Barbara 08/12/2022, 3:45 PM

## 2022-08-12 NOTE — Progress Notes (Signed)
ANTICOAGULATION CONSULT NOTE  Pharmacy Consult for heparin infusion Indication: NSTEMI  Allergies  Allergen Reactions   Shellfish Allergy Anaphylaxis    Throat swelling   Flexeril [Cyclobenzaprine] Hypertension    Patient Measurements: Height: '5\' 8"'$  (172.7 cm) Weight: 73.1 kg (161 lb 2.5 oz) IBW/kg (Calculated) : 63.9 Heparin Dosing Weight: 63.5 kg  Vital Signs: Temp: 98 F (36.7 C) (10/26 0423) Temp Source: Oral (10/26 0423) BP: 151/64 (10/26 0423) Pulse Rate: 78 (10/26 0423)  Labs: Recent Labs    08/09/22 0745 08/09/22 1048 08/10/22 0420 08/10/22 0420 08/10/22 0639 08/11/22 0420 08/12/22 0533  HGB  --   --  10.0*   < >  --  9.1* 8.7*  HCT  --   --  30.6*  --   --  28.2* 26.3*  PLT  --   --  173  --   --  131* 137*  APTT 113*  --   --   --   --   --   --   LABPROT 15.5*  --   --   --   --   --   --   INR 1.2  --   --   --   --   --   --   HEPARINUNFRC  --   --  0.70  --   --  0.34 0.16*  CREATININE  --   --  2.33*  --   --  1.56* 1.03*  TROPONINIHS >24,000* >24,000*  --   --  >24,000*  --   --    < > = values in this interval not displayed.     Estimated Creatinine Clearance: 44.7 mL/min (A) (by C-G formula based on SCr of 1.03 mg/dL (H)).   Medical History: Past Medical History:  Diagnosis Date   CAD (coronary artery disease)    a. 04/2017 Cath: LM 72, LAD 80p, 64m LCX 95ost, 777mEF 45-50%; b. 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   Diastolic dysfunction    a. 04/2017 Echo: EF 55-60%, no rwma, Gr1 DD, mildly dil LA; b. 08/2020 Echo: EF 55-60%, no rwma, GrII DD, nl RV fxn, RVSP 3043m, mod dil LA, mildly dil RA, Mod TR.   Hyperlipidemia    Hypertensive heart disease    Insulin dependent diabetes mellitus    PAD (peripheral artery disease) ([facility]  a. 02/2021 PTA/DCBA R Peroneal, R Popliteal, distal R SFA.   PSVT (paroxysmal supraventricular tachycardia)    a. 02/2022 Zio: Predominantly sinus rhythm @ 61 (36-218). 2 NSVT runs (fastest/longest 6  beats @ 218). 37 SVT/A tach runs (fastest 185 x 5 beats, longest 32.4 secs @ 107). Triggered events = RSR, PAC.   [facility] [place]  a. 01/2022 R sided wkns/aphasia/tremor/slurred speech-->Ss resolved; b. 02/2022 MRI/A: punctate subacute inf vs artifact-post limb of L int capsule. 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 Mod, chronic small vessel isch changes within the cerebral white matter.  Subcm chronic infarct- L cerebellar hemisphere.  Sev dzs prox P2 seg of R PCA. Mod dzs A1 R antClinical cytogeneticist  Assessment: Pt is a [age] female presenting to ED w/ AMS, found with elevated Troponin I level, being treated for NSTEMI.  Goal of Therapy:  INR 2-3 Monitor platelets by anticoagulation protocol: Yes   Date/Time  HL Rate 10/22'@1439'$   0.62 750 units/hr, therapeutic x1 10/22 2242  0.68 Therapeutic x 2 10/23 0526  0.63 Therapeutic x 3 10/24 0420  0.70  Therapeutic x 4 10/25 0420  0.34 Therapeutic x 5  Plan:  Bolus 2000 units x 1 Will increase heparin infusion to 850 units/hr Will recheck HL in 8 hours after rate change CBC daily while on heparin  Pearla Dubonnet, PharmD Clinical Pharmacist 08/12/2022 6:18 AM

## 2022-08-12 NOTE — NC FL2 (Signed)
[place] LEVEL OF CARE SCREENING TOOL     IDENTIFICATION  Patient Name: Carrie Kane Birthdate: 1941-07-28 Sex: female Admission Date (Current Location): 08/08/2022  [facility] and [place] Number:  Engineering geologist and Address:         Provider Number: [id number]  Attending Physician Name and Address:  Shawna Clamp, MD  Relative Name and Phone Number:       Current Level of Care: Hospital Recommended Level of Care: [place] Prior Approval Number:    Date Approved/Denied:   PASRR Number: [id number]  Discharge Plan: SNF    Current Diagnoses: Patient Active Problem List   Diagnosis Date Noted   Dehydration    PAF (paroxysmal atrial fibrillation) ([place])    Diabetic ketoacidosis without coma associated with type 2 diabetes mellitus ([place])    Sepsis with acute renal failure and septic shock ([place])    Acute metabolic encephalopathy 29/93/7169   AKI (acute kidney injury) ([place])    Altered mental status    NSTEMI (non-ST elevated myocardial infarction) ([place])    Malnutrition of moderate degree 05/11/2022   Left displaced femoral neck fracture ([place]) 05/10/2022   Fall at home, initial encounter 05/10/2022   Preoperative clearance 05/10/2022   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) 05/10/2022   Essential hypertension 01/18/2021   Claudication ([place]) 12/23/2020   Hypothyroidism 12/23/2020   Anemia 06/17/2020   Weight loss 06/17/2020   Iron deficiency anemia due to chronic blood loss 06/17/2020   Abscess of left foot 06/12/2020   Pre-ulcerative calluses 04/03/2020   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) ([place]) 08/02/2017   Mixed hyperlipidemia 08/02/2017   CAD with Hx of CABG 08/01/2017   Type 2 diabetes mellitus without complication, with long-term current use of insulin ([place]) 04/28/2017   Bruit 04/28/2017   Unstable angina (HCC) 04/28/2017   Abnormal stress test 67/89/3810   Systolic dysfunction 17/51/0258    Acanthosis nigricans 01/26/2012   Disease of hair and hair follicles 52/77/8242   Hirsutism 01/26/2012   Local infection of skin and subcutaneous tissue 01/26/2012    Orientation RESPIRATION BLADDER Height & Weight     Self, Time, Situation, Place  Normal Indwelling catheter Weight: 73.1 kg Height:  '5\' 8"'$  (172.7 cm)  BEHAVIORAL SYMPTOMS/MOOD NEUROLOGICAL BOWEL NUTRITION STATUS      Continent Diet (dys 2)  AMBULATORY STATUS COMMUNICATION OF NEEDS Skin   Extensive Assist Verbally Normal                       Personal Care Assistance Level of Assistance              Functional Limitations Info             SPECIAL CARE FACTORS FREQUENCY  OT (By licensed OT), PT (By licensed PT)                    Contractures Contractures Info: Not present    Additional Factors Info  Code Status, Allergies Code Status Info: full Allergies Info: Shellfish, flexeril           Current Medications (08/12/2022):  This is the current hospital active medication list Current Facility-Administered Medications  Medication Dose Route Frequency Provider Last Rate Last Admin   aspirin chewable tablet 81 mg  81 mg Per Tube Daily Dgayli, Khabib, MD   81 mg at 08/12/22 1110   ceFEPIme (MAXIPIME) 2 g in sodium chloride 0.9 % 100 mL IVPB  2  g Intravenous Q12H Lorna Dibble, RPH 200 mL/hr at 08/12/22 1005 2 g at 08/12/22 1005   Chlorhexidine Gluconate Cloth 2 % PADS 6 each  6 each Topical Daily Bronson Ing, MD   6 each at 08/12/22 1006   dextrose 50 % solution 0-50 mL  0-50 mL Intravenous PRN Vladimir Crofts, MD   50 mL at 08/09/22 1041   dextrose 50 % solution 50 mL  1 ampule Intravenous Once Teressa Lower, NP       feeding supplement (ENSURE ENLIVE / ENSURE PLUS) liquid 237 mL  237 mL Oral TID BM Shawna Clamp, MD   237 mL at 08/12/22 1447   heparin ADULT infusion 100 units/mL (25000 units/255m)  850 Units/hr Intravenous Continuous Nazari, Walid A, RPH 8.5 mL/hr at 08/12/22 1218  850 Units/hr at 08/12/22 1218   insulin aspart (novoLOG) injection 0-9 Units  0-9 Units Subcutaneous Q4H NTeressa Lower NP   2 Units at 08/12/22 1251   insulin glargine-yfgn (SEMGLEE) injection 8 Units  8 Units Subcutaneous Daily NTeressa Lower NP   8 Units at 08/12/22 1007   metroNIDAZOLE (FLAGYL) IVPB 500 mg  500 mg Intravenous Q8H Beers,Shanon Brow RPH 100 mL/hr at 08/12/22 1215 500 mg at 08/12/22 1215   [START ON 08/13/2022] multivitamin with minerals tablet 1 tablet  1 tablet Oral Daily KShawna Clamp MD       ondansetron ([facility] injection 4 mg  4 mg Intravenous Q8H PRN KShawna Clamp MD   4 mg at 08/11/22 1438   Oral care mouth rinse  15 mL Mouth Rinse PRN OLang Snow NP       Oral care mouth rinse  15 mL Mouth Rinse Q6H KShawna Clamp MD   15 mL at 08/12/22 1113   pantoprazole (PROTONIX) injection 40 mg  40 mg Intravenous QHS GDallie Piles RPH   40 mg at 08/11/22 2132   polyethylene glycol (MIRALAX / GLYCOLAX) packet 17 g  17 g Oral Daily PRN OLang Snow NP       potassium PHOSPHATE 30 mmol in dextrose 5 % 500 mL infusion  30 mmol Intravenous Once KShawna Clamp MD 85 mL/hr at 08/12/22 1447 30 mmol at 08/12/22 1447   senna-docusate (Senokot-S) tablet 1 tablet  1 tablet Oral BID KShawna Clamp MD   1 tablet at 08/12/22 1251   sodium chloride flush (NS) 0.9 % injection 3 mL  3 mL Intravenous Q12H Furth, Cadence H, PA-C   3 mL at 08/12/22 1251     Discharge Medications: Please see discharge summary for a list of discharge medications.  Relevant Imaging Results:  Relevant Lab Results:   Additional Information SS# [id number] SBeverly Sessions RN

## 2022-08-13 ENCOUNTER — Encounter
Admission: EM | Disposition: A | Discharge status: Skilled Nursing Facility | Payer: Self-pay | Source: Home / Self Care | Attending: Family Medicine

## 2022-08-13 DIAGNOSIS — I214 Non-ST elevation (NSTEMI) myocardial infarction: Secondary | ICD-10-CM | POA: Diagnosis not present

## 2022-08-13 DIAGNOSIS — E1110 Type 2 diabetes mellitus with ketoacidosis without coma: Secondary | ICD-10-CM | POA: Diagnosis not present

## 2022-08-13 DIAGNOSIS — N179 Acute kidney failure, unspecified: Secondary | ICD-10-CM | POA: Diagnosis not present

## 2022-08-13 DIAGNOSIS — I25119 Atherosclerotic heart disease of native coronary artery with unspecified angina pectoris: Secondary | ICD-10-CM

## 2022-08-13 DIAGNOSIS — G9341 Metabolic encephalopathy: Secondary | ICD-10-CM | POA: Diagnosis not present

## 2022-08-13 DIAGNOSIS — Z951 Presence of aortocoronary bypass graft: Secondary | ICD-10-CM

## 2022-08-13 DIAGNOSIS — R4182 Altered mental status, unspecified: Secondary | ICD-10-CM | POA: Diagnosis not present

## 2022-08-13 HISTORY — PX: LEFT HEART CATH AND CORONARY ANGIOGRAPHY: CATH118249

## 2022-08-13 LAB — POCT I-STAT EG7
Acid-Base Excess: 4 mmol/L — ABNORMAL HIGH (ref 0.0–2.0)
Bicarbonate: 28 mmol/L (ref 20.0–28.0)
Calcium, Ion: 1.15 mmol/L (ref 1.15–1.40)
HCT: 25 % — ABNORMAL LOW (ref 36.0–46.0)
Hemoglobin: 8.5 g/dL — ABNORMAL LOW (ref 12.0–15.0)
O2 Saturation: 69 %
Potassium: 4.1 mmol/L (ref 3.5–5.1)
Sodium: 139 mmol/L (ref 135–145)
TCO2: 29 mmol/L (ref 22–32)
pCO2, Ven: 36.8 mmHg — ABNORMAL LOW (ref 44–60)
pH, Ven: 7.49 — ABNORMAL HIGH (ref 7.25–7.43)
pO2, Ven: 33 mmHg (ref 32–45)

## 2022-08-13 LAB — CULTURE, BLOOD (ROUTINE X 2)
Culture: NO GROWTH
Culture: NO GROWTH
Special Requests: ADEQUATE
Special Requests: ADEQUATE

## 2022-08-13 LAB — BASIC METABOLIC PANEL
Anion gap: 6 (ref 5–15)
BUN: 23 mg/dL (ref 8–23)
CO2: 30 mmol/L (ref 22–32)
Calcium: 8 mg/dL — ABNORMAL LOW (ref 8.9–10.3)
Chloride: 104 mmol/L (ref 98–111)
Creatinine, Ser: 0.84 mg/dL (ref 0.44–1.00)
GFR, Estimated: >60 mL/min (ref >60)
Glucose, Bld: 211 mg/dL — ABNORMAL HIGH (ref 70–99)
Potassium: 3.3 mmol/L — ABNORMAL LOW (ref 3.5–5.1)
Sodium: 140 mmol/L (ref 135–145)

## 2022-08-13 LAB — GLUCOSE, CAPILLARY
Glucose-Capillary: 127 mg/dL — ABNORMAL HIGH (ref 70–99)
Glucose-Capillary: 148 mg/dL — ABNORMAL HIGH (ref 70–99)
Glucose-Capillary: 159 mg/dL — ABNORMAL HIGH (ref 70–99)
Glucose-Capillary: 211 mg/dL — ABNORMAL HIGH (ref 70–99)
Glucose-Capillary: 280 mg/dL — ABNORMAL HIGH (ref 70–99)
Glucose-Capillary: 99 mg/dL (ref 70–99)

## 2022-08-13 LAB — POCT I-STAT 7, (LYTES, BLD GAS, ICA,H+H)
Acid-Base Excess: 5 mmol/L — ABNORMAL HIGH (ref 0.0–2.0)
Bicarbonate: 27.8 mmol/L (ref 20.0–28.0)
Calcium, Ion: 1.17 mmol/L (ref 1.15–1.40)
HCT: 25 % — ABNORMAL LOW (ref 36.0–46.0)
Hemoglobin: 8.5 g/dL — ABNORMAL LOW (ref 12.0–15.0)
O2 Saturation: 95 %
Potassium: 4.1 mmol/L (ref 3.5–5.1)
Sodium: 139 mmol/L (ref 135–145)
TCO2: 29 mmol/L (ref 22–32)
pCO2 arterial: 34.8 mmHg (ref 32–48)
pH, Arterial: 7.511 — ABNORMAL HIGH (ref 7.35–7.45)
pO2, Arterial: 67 mmHg — ABNORMAL LOW (ref 83–108)

## 2022-08-13 LAB — HEPARIN LEVEL (UNFRACTIONATED): Heparin Unfractionated: 0.33 IU/mL (ref 0.30–0.70)

## 2022-08-13 LAB — MAGNESIUM: Magnesium: 1.9 mg/dL (ref 1.7–2.4)

## 2022-08-13 LAB — PHOSPHORUS: Phosphorus: 1.8 mg/dL — ABNORMAL LOW (ref 2.5–4.6)

## 2022-08-13 SURGERY — LEFT HEART CATH AND CORONARY ANGIOGRAPHY
Anesthesia: Moderate Sedation

## 2022-08-13 MED ORDER — SODIUM CHLORIDE 0.9 % IV SOLN: 250.0000 mL | INTRAVENOUS | Status: DC | PRN

## 2022-08-13 MED ORDER — VERAPAMIL HCL 2.5 MG/ML IV SOLN
INTRAVENOUS | Status: AC
  Filled 2022-08-13: qty 2

## 2022-08-13 MED ORDER — ALUM & MAG HYDROXIDE-SIMETH 200-200-20 MG/5ML PO SUSP
ORAL | Status: AC
  Filled 2022-08-13: qty 30

## 2022-08-13 MED ORDER — SODIUM CHLORIDE 0.9 % IV SOLN
250.0000 mL | INTRAVENOUS | Status: DC | PRN
  Administered 2022-08-14: 250 mL via INTRAVENOUS

## 2022-08-13 MED ORDER — HEPARIN (PORCINE) IN NACL 1000-0.9 UT/500ML-% IV SOLN
INTRAVENOUS | Status: DC | PRN
  Administered 2022-08-13 (×2): 500 mL

## 2022-08-13 MED ORDER — SODIUM CHLORIDE 0.9% FLUSH: 3.0000 mL | INTRAVENOUS | Status: DC | PRN

## 2022-08-13 MED ORDER — FENTANYL CITRATE (PF) 100 MCG/2ML IJ SOLN
INTRAMUSCULAR | Status: AC
  Filled 2022-08-13: qty 2

## 2022-08-13 MED ORDER — OXYCODONE HCL 5 MG PO TABS
5.0000 mg | ORAL_TABLET | Freq: Once | ORAL | Status: AC
  Administered 2022-08-13: 5 mg via ORAL
  Filled 2022-08-13: qty 1

## 2022-08-13 MED ORDER — ACETAMINOPHEN 325 MG PO TABS: 650.0000 mg | ORAL_TABLET | ORAL | Status: DC | PRN

## 2022-08-13 MED ORDER — ASPIRIN 81 MG PO CHEW: 81.0000 mg | CHEWABLE_TABLET | ORAL | Status: DC

## 2022-08-13 MED ORDER — POTASSIUM PHOSPHATES 15 MMOLE/5ML IV SOLN
30.0000 mmol | Freq: Once | INTRAVENOUS | Status: AC
  Administered 2022-08-13: 30 mmol via INTRAVENOUS
  Filled 2022-08-13: qty 10

## 2022-08-13 MED ORDER — SODIUM CHLORIDE 0.9 % WEIGHT BASED INFUSION
3.0000 mL/kg/h | INTRAVENOUS | Status: DC
  Administered 2022-08-13: 3 mL/kg/h via INTRAVENOUS

## 2022-08-13 MED ORDER — LIDOCAINE HCL 1 % IJ SOLN
INTRAMUSCULAR | Status: AC
  Filled 2022-08-13: qty 20

## 2022-08-13 MED ORDER — POTASSIUM CHLORIDE CRYS ER 20 MEQ PO TBCR
40.0000 meq | EXTENDED_RELEASE_TABLET | Freq: Once | ORAL | Status: AC
  Administered 2022-08-13: 40 meq via ORAL
  Filled 2022-08-13: qty 2

## 2022-08-13 MED ORDER — MORPHINE SULFATE (PF) 2 MG/ML IV SOLN
INTRAVENOUS | Status: AC
  Filled 2022-08-13: qty 1

## 2022-08-13 MED ORDER — ALUM & MAG HYDROXIDE-SIMETH 200-200-20 MG/5ML PO SUSP
30.0000 mL | ORAL | Status: DC | PRN
  Administered 2022-08-13: 30 mL via ORAL

## 2022-08-13 MED ORDER — SODIUM CHLORIDE 0.9% FLUSH
3.0000 mL | Freq: Two times a day (BID) | INTRAVENOUS | Status: DC
  Administered 2022-08-14 – 2022-08-16 (×6): 3 mL via INTRAVENOUS

## 2022-08-13 MED ORDER — MAGNESIUM SULFATE 2 GM/50ML IV SOLN
2.0000 g | Freq: Once | INTRAVENOUS | Status: AC
  Administered 2022-08-13: 2 g via INTRAVENOUS
  Filled 2022-08-13: qty 50

## 2022-08-13 MED ORDER — NITROGLYCERIN 0.4 MG SL SUBL
SUBLINGUAL_TABLET | SUBLINGUAL | Status: AC
  Administered 2022-08-13: 0.4 mg via SUBLINGUAL
  Filled 2022-08-13: qty 3

## 2022-08-13 MED ORDER — HEPARIN SODIUM (PORCINE) 1000 UNIT/ML IJ SOLN
INTRAMUSCULAR | Status: AC
  Filled 2022-08-13: qty 10

## 2022-08-13 MED ORDER — SODIUM CHLORIDE 0.9 % WEIGHT BASED INFUSION
1.0000 mL/kg/h | INTRAVENOUS | Status: DC
  Administered 2022-08-13: 1 mL/kg/h via INTRAVENOUS

## 2022-08-13 MED ORDER — MIDAZOLAM HCL 2 MG/2ML IJ SOLN
INTRAMUSCULAR | Status: AC
  Filled 2022-08-13: qty 2

## 2022-08-13 MED ORDER — IOHEXOL 300 MG/ML  SOLN
INTRAMUSCULAR | Status: DC | PRN
  Administered 2022-08-13: 78 mL

## 2022-08-13 MED ORDER — MORPHINE SULFATE (PF) 2 MG/ML IV SOLN
1.0000 mg | Freq: Once | INTRAVENOUS | Status: AC
  Administered 2022-08-13: 1 mg via INTRAVENOUS

## 2022-08-13 MED ORDER — NITROGLYCERIN 0.4 MG SL SUBL: 0.4000 mg | SUBLINGUAL_TABLET | SUBLINGUAL | Status: DC | PRN

## 2022-08-13 MED ORDER — LABETALOL HCL 5 MG/ML IV SOLN: 10.0000 mg | INTRAVENOUS | Status: AC | PRN

## 2022-08-13 MED ORDER — HYDRALAZINE HCL 20 MG/ML IJ SOLN: 10.0000 mg | INTRAMUSCULAR | Status: AC | PRN

## 2022-08-13 MED ORDER — HEPARIN (PORCINE) IN NACL 1000-0.9 UT/500ML-% IV SOLN
INTRAVENOUS | Status: AC
  Filled 2022-08-13: qty 1000

## 2022-08-13 MED ORDER — SODIUM CHLORIDE 0.9 % IV SOLN: INTRAVENOUS | Status: AC

## 2022-08-13 MED ORDER — HEPARIN SODIUM (PORCINE) 5000 UNIT/ML IJ SOLN
5000.0000 [IU] | Freq: Three times a day (TID) | INTRAMUSCULAR | Status: DC
  Administered 2022-08-13 – 2022-08-14 (×2): 5000 [IU] via SUBCUTANEOUS
  Filled 2022-08-13 (×2): qty 1

## 2022-08-13 MED ORDER — LIDOCAINE HCL (PF) 1 % IJ SOLN
INTRAMUSCULAR | Status: DC | PRN
  Administered 2022-08-13: 18 mL
  Administered 2022-08-13: 2 mL

## 2022-08-13 MED ORDER — POTASSIUM CHLORIDE 20 MEQ PO PACK: 40.0000 meq | PACK | Freq: Once | ORAL | Status: DC

## 2022-08-13 MED ORDER — NITROGLYCERIN 0.4 MG SL SUBL: 0.4000 mg | SUBLINGUAL_TABLET | Freq: Once | SUBLINGUAL | Status: AC

## 2022-08-13 SURGICAL SUPPLY — 16 items
CANNULA 5F STIFF (CANNULA) IMPLANT
CATH INFINITI 5FR AL1 (CATHETERS) IMPLANT
CATH INFINITI 5FR MULTPACK ANG (CATHETERS) IMPLANT
CATH SWAN GANZ 7F STRAIGHT (CATHETERS) IMPLANT
DEVICE CLOSURE MYNXGRIP 5F (Vascular Products) IMPLANT
DRAPE BRACHIAL (DRAPES) IMPLANT
GLIDESHEATH SLEND SS 6F .021 (SHEATH) IMPLANT
GUIDEWIRE INQWIRE 1.5J.035X260 (WIRE) IMPLANT
INQWIRE 1.5J .035X260CM (WIRE) ×1
PACK CARDIAC CATH (CUSTOM PROCEDURE TRAY) ×1 IMPLANT
PROTECTION STATION PRESSURIZED (MISCELLANEOUS) ×1
SET ATX SIMPLICITY (MISCELLANEOUS) IMPLANT
SHEATH AVANTI 5FR X 11CM (SHEATH) IMPLANT
SHEATH AVANTI 7FRX11 (SHEATH) IMPLANT
STATION PROTECTION PRESSURIZED (MISCELLANEOUS) IMPLANT
WIRE GUIDERIGHT .035X150 (WIRE) IMPLANT

## 2022-08-13 NOTE — Progress Notes (Signed)
ANTICOAGULATION CONSULT NOTE  Pharmacy Consult for heparin infusion Indication: NSTEMI  Allergies  Allergen Reactions   Shellfish Allergy Anaphylaxis    Throat swelling   Flexeril [Cyclobenzaprine] Hypertension    Patient Measurements: Height: '5\' 8"'$  (172.7 cm) Weight: 73.1 kg (161 lb 2.5 oz) IBW/kg (Calculated) : 63.9 Heparin Dosing Weight: 63.5 kg  Vital Signs: Temp: 99.5 F (37.5 C) (10/26 1934) Temp Source: Oral (10/26 1934) BP: 135/50 (10/26 1934) Pulse Rate: 84 (10/26 1934)  Labs: Recent Labs    08/10/22 0420 08/10/22 0639 08/11/22 0420 08/12/22 0533 08/12/22 1509 08/12/22 2314  HGB 10.0*  --  9.1* 8.7*  --   --   HCT 30.6*  --  28.2* 26.3*  --   --   PLT 173  --  131* 137*  --   --   HEPARINUNFRC 0.70  --  0.34 0.16* 0.40 0.30  CREATININE 2.33*  --  1.56* 1.03*  --   --   TROPONINIHS  --  >24,000*  --   --   --   --      Estimated Creatinine Clearance: 44.7 mL/min (A) (by C-G formula based on SCr of 1.03 mg/dL (H)).   Medical History: Past Medical History:  Diagnosis Date   CAD (coronary artery disease)    a. 04/2017 Cath: LM 20, LAD 80p, 2m LCX 95ost, 758mEF 45-50%; b. 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   Diastolic dysfunction    a. 04/2017 Echo: EF 55-60%, no rwma, Gr1 DD, mildly dil LA; b. 08/2020 Echo: EF 55-60%, no rwma, GrII DD, nl RV fxn, RVSP 3052m, mod dil LA, mildly dil RA, Mod TR.   Hyperlipidemia    Hypertensive heart disease    Insulin dependent diabetes mellitus    PAD ([place]ipheral artery disease) ([place] [place]  a. 02/2021 PTA/DCBA R Peroneal, R Popliteal, distal R SFA.   PSVT (paroxysmal supraventricular tachycardia)    a. 02/2022 Zio: Predominantly sinus rhythm @ 61 (36-218). 2 NSVT runs (fastest/longest 6 beats @ 218). 37 SVT/A tach runs (fastest 185 x 5 beats, longest 32.4 secs @ 107). Triggered events = RSR, PAC.   [facility]  [facility] 01/2022 R sided wkns/aphasia/tremor/slurred speech-->Ss resolved; b. 02/2022 MRI/A: punctate  subacute inf vs artifact-post limb of L int capsule. 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 Mod, chronic small vessel isch changes within the cerebral white matter.  Subcm chronic infarct- L cerebellar hemisphere.  Sev dzs prox P2 seg of R PCA. Mod dzs A1 R antClinical cytogeneticist  Assessment: Pt [age] a [age] female presenting to ED w/ AMS, found with elevated Troponin I level, being treated for NSTEMI.  Goal of Therapy:  INR 2-3 Monitor platelets by anticoagulation protocol: Yes   Date/Time  HL Rate 10/22'@1439'$   0.62 750 units/hr, therapeutic x1 10/22 2242  0.68 Therapeutic x 2 10/23 0526  0.63 Therapeutic x 3 10/24 0420  0.70 Therapeutic x 4 10/25 0420  0.34 Therapeutic x 5 10/26 0533  0.16 Sub-therapeutic , 750 un->850 units 10/26 1509  0.40 Therapeutic x 1 10/26 2314  0.30 Therapeutic x 2  Plan:  Lower end of therapeutic range Will increase heparin infusion to 950 units/hr Will recheck HL in 8 hours  CBC daily while on heparin  WalPearla DubonnetharmD Clinical Pharmacist 08/13/2022 12:53 AM

## 2022-08-13 NOTE — Progress Notes (Signed)
ANTICOAGULATION CONSULT NOTE  Pharmacy Consult for heparin infusion Indication: NSTEMI  Allergies  Allergen Reactions   Shellfish Allergy Anaphylaxis    Throat swelling   Flexeril [Cyclobenzaprine] Hypertension    Patient Measurements: Height: '5\' 8"'$  (172.7 cm) Weight: 73.1 kg (161 lb 2.5 oz) IBW/kg (Calculated) : 63.9 Heparin Dosing Weight: 63.5 kg  Vital Signs: Temp: 98.7 F (37.1 C) (10/27 0743) Temp Source: Oral (10/27 0743) BP: 119/55 (10/27 0743) Pulse Rate: 75 (10/27 0743)  Labs: Recent Labs    08/11/22 0420 08/12/22 0533 08/12/22 1509 08/12/22 2314 08/13/22 0500 08/13/22 1032  HGB 9.1* 8.7*  --   --   --   --   HCT 28.2* 26.3*  --   --   --   --   PLT 131* 137*  --   --   --   --   HEPARINUNFRC 0.34 0.16* 0.40 0.30  --  0.33  CREATININE 1.56* 1.03*  --   --  0.84  --      Estimated Creatinine Clearance: 54.8 mL/min (by C-G formula based on SCr of 0.84 mg/dL).   Medical History: Past Medical History:  Diagnosis Date   CAD (coronary artery disease)    a. 04/2017 Cath: LM 60, LAD 80p, 28m LCX 95ost, 760mEF 45-50%; b. 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   Diastolic dysfunction    a. 04/2017 Echo: EF 55-60%, no rwma, Gr1 DD, mildly dil LA; b. 08/2020 Echo: EF 55-60%, no rwma, GrII DD, nl RV fxn, RVSP 3025m, mod dil LA, mildly dil RA, Mod TR.   Hyperlipidemia    Hypertensive heart disease    Insulin dependent diabetes mellitus    PAD (peripheral artery disease) ([place]  a. 02/2021 PTA/DCBA R Peroneal, R Popliteal, distal R SFA.   PSVT (paroxysmal supraventricular tachycardia)    a. 02/2022 Zio: Predominantly sinus rhythm @ 61 (36-218). 2 NSVT runs (fastest/longest 6 beats @ 218). 37 SVT/A tach runs (fastest 185 x 5 beats, longest 32.4 secs @ 107). Triggered events = RSR, PAC.   [facility]  a. 01/2022 R sided wkns/aphasia/tremor/slurred speech-->Ss resolved; b. 02/2022 MRI/A: punctate subacute inf vs artifact-post limb of L int capsule. Chronic  lacunar infarcts-right caudate nucleus/right thalamus/left pons. Mod, chronic small vessel isch changes within the cerebral white matter.  Subcm chronic infarct- L cerebellar hemisphere.  Sev dzs prox P2 seg of R PCA. Mod dzs A1 R antClinical cytogeneticist  Assessment: Pt is a [age] female presenting to ED w/ AMS, found with elevated Troponin I level, being treated for NSTEMI.  Goal of Therapy:  INR 2-3 Monitor platelets by anticoagulation protocol: Yes   Date/Time  HL Rate 10/22'@1439'$   0.62 750 units/hr, therapeutic x1 10/22 2242  0.68 Therapeutic x 2 10/23 0526  0.63 Therapeutic x 3 10/24 0420  0.70 Therapeutic x 4 10/25 0420  0.34 Therapeutic x 5 10/26 0533  0.16 Sub-therapeutic , 750 un->850 units 10/26 1509  0.40 Therapeutic x 1 10/26 2314  0.30 Therapeutic x 2 10/27 1032  0.33 Therapeutic x 3  Plan:  10/27 1032 HL=0.33 Therapeutic x 3 Will continue heparin infusion at 950 units/hr Will recheck HL with am labs CBC daily while on heparin  MerNoralee SpaceharmD Clinical Pharmacist 08/13/2022 11:32 AM

## 2022-08-13 NOTE — Progress Notes (Signed)
PROGRESS NOTE    Carrie Kane  WCH:[id number] DOB: Dec 23, 1940 DOA: 08/08/2022 PCP: Cletis Athens, MD    Brief Narrative:  This 81 years old female with PMH significant of left displaced femoral neck fracture, T2DM, CAD with Hx of CABG X 3, PAD s/p revascularization in 2022, PSVT, Anemia, Hypothyroidism, hypertension, HLD, CVA (02/2022) who presented to the ED with chief complaints of altered mental status and shortness of breath. She was noted to be hypotensive, have severe metabolic acidosis and hyperglycemia.  He was admitted in the ICU for DKA and managed as per protocol.  She was also found to have elevated troponin cardiology was consulted, recommended left heart cath once renal functions improved.  She is off pressor support.  CT shows small PE.  She was intubated and eventually successfully extubated. TRH pickup 08/11/2022.  Assessment & Plan:   Principal Problem:   Acute metabolic encephalopathy Active Problems:   AKI (acute kidney injury) ([place])   Altered mental status   NSTEMI (non-ST elevated myocardial infarction) ([place])   Diabetic ketoacidosis without coma associated with type 2 diabetes mellitus ([place])   Sepsis with acute renal failure and septic shock (HCC)   PAF (paroxysmal atrial fibrillation) (HCC)   Dehydration  Acute metabolic encephalopathy likely multifactorial: Patient presented with DKA, shock and possible infection. Mental status has improved.  DKA resolved. she is back to baseline.  Acute hypoxic respiratory failure requiring mechanical ventilation: She is successfully extubated.   Continue supplemental oxygen and wean as tolerated.  NSTEMI: Patient had history of CAD with multiple PCI and CABG Cardiology is on the board , recommended to continue heparin gtt. Patient denies any chest pain.  Continue aspirin,  Lipitor. Echocardiogram shows LVEF 65 to 70%. Plan for left heart cath once renal functions improve and critical illness resolves. She is  scheduled for left heart cath today.   New onset A-fib: Heart rate is controlled, now in sinus rhythm. Continue heparin, plan for DOAC on discharge.   Acute kidney injury in the setting of hypotension and DKA. Nephrology is following.  Serum creatinine improved, AKI resolved. Avoid nephrotoxic medications. No acute indication for dialysis at present. Nephrology signed off.  Diabetic ketoacidosis: Resolved.  Continue sliding scale.  Normochromic normocytic anemia without blood loss H&H remains stable.   Monitor H&H while on heparin.  Shock: Undifferentiated. > Resolved. Could be combination of sepsis, hypovolemia in the setting of DKA She required pressor support. Levophed subsequently discontinued. CT abdomen pelvis shows pericholecystic fluid Ultrasound abdomen negative for acute cholecystitis. General surgery is consulted, if patient declines consider HIDA scan. Patient is improving with medical management. Continue cefepime and Flagyl.  General surgery signed off.   DVT prophylaxis: Heparin Code Status: Full code Family Communication: No family at bedside Disposition Plan:   Status is: Inpatient Remains inpatient appropriate because: Admitted for multiple active issues which are recovering. Patient needs left heart cath given NSTEMI pending normalization of serum creatinine. Tentatively scheduled for left heart cath 08/13/2022.   Consultants:  Cardiology Nephrology PCCM  Procedures: None Antimicrobials: Anti-infectives (From admission, onward)    Start     Dose/Rate Route Frequency Ordered Stop   08/12/22 1000  ceFEPIme (MAXIPIME) 2 g in sodium chloride 0.9 % 100 mL IVPB        2 g 200 mL/hr over 30 Minutes Intravenous Every 12 hours 08/12/22 0740 08/13/22 2359   08/08/22 2200  ceFEPIme (MAXIPIME) 2 g in sodium chloride 0.9 % 100 mL IVPB  Status:  Discontinued  2 g 200 mL/hr over 30 Minutes Intravenous Every 24 hours 08/08/22 1135 08/12/22 0740    08/08/22 1200  metroNIDAZOLE (FLAGYL) IVPB 500 mg        500 mg 100 mL/hr over 60 Minutes Intravenous Every 8 hours 08/08/22 1105 08/13/22 2359   08/08/22 0745  ceFEPIme (MAXIPIME) 2 g in sodium chloride 0.9 % 100 mL IVPB  Status:  Discontinued        2 g 200 mL/hr over 30 Minutes Intravenous  Once 08/08/22 0649 08/08/22 0816   08/08/22 0745  vancomycin (VANCOREADY) IVPB 1500 mg/300 mL  Status:  Discontinued        1,500 mg 150 mL/hr over 120 Minutes Intravenous  Once 08/08/22 0649 08/08/22 0816   08/08/22 0300  ceFEPIme (MAXIPIME) 2 g in sodium chloride 0.9 % 100 mL IVPB        2 g 200 mL/hr over 30 Minutes Intravenous  Once 08/08/22 0249 08/08/22 0326   08/08/22 0300  vancomycin (VANCOREADY) IVPB 1500 mg/300 mL        1,500 mg 150 mL/hr over 120 Minutes Intravenous  Once 08/08/22 0249 08/08/22 6789       Subjective: Patient was seen and examined at bedside. Overnight events noted. Patient appears much improved. She is back to her baseline mental status. She denies any chest pain or shortness of breath.  Objective: Vitals:   08/12/22 1642 08/12/22 1934 08/13/22 0317 08/13/22 0743  BP: (!) 133/58 (!) 135/50 112/64 (!) 119/55  Pulse: 85 84 81 75  Resp: '18 20 20 18  '$ Temp: 98.6 F (37 C) 99.5 F (37.5 C) 98.3 F (36.8 C) 98.7 F (37.1 C)  TempSrc: Oral Oral  Oral  SpO2: 100% 98% 96% 98%  Weight:      Height:        Intake/Output Summary (Last 24 hours) at 08/13/2022 1155 Last data filed at 08/13/2022 3810 Gross per 24 hour  Intake 1319.91 ml  Output 150 ml  Net 1169.91 ml   Filed Weights   08/10/22 0447 08/11/22 0431 08/12/22 0500  Weight: 72.6 kg 71.9 kg 73.1 kg    Examination:  General exam: Appears comfortable, NAD, very deconditioned. Respiratory system: CTA bilaterally, respiratory effort normal, RR 15 Cardiovascular system: S1-S2 heard, regular rate and rhythm, no murmur. Gastrointestinal system: Abdomen is soft, non tender, non distended, BS+ Central  nervous system: Alert and oriented x 3. No focal neurological deficits. Extremities: No edema, no cyanosis, no clubbing Skin: No rashes, lesions or ulcers Psychiatry: Judgement and insight appear normal. Mood & affect appropriate.     Data Reviewed: I have personally reviewed following labs and imaging studies  CBC: Recent Labs  Lab 08/08/22 0210 08/08/22 0339 08/09/22 0526 08/10/22 0420 08/11/22 0420 08/12/22 0533  WBC 19.0* 21.1* 19.2* 16.8* 8.8 7.8  NEUTROABS 14.1* 16.0*  --   --   --   --   HGB 9.8* 8.9* 9.6* 10.0* 9.1* 8.7*  HCT 34.8* 30.2* 28.6* 30.6* 28.2* 26.3*  MCV 105.8* 99.3 89.9 90.5 92.2 92.3  PLT 214 195 188 173 131* 175*   Basic Metabolic Panel: Recent Labs  Lab 08/08/22 0513 08/08/22 1047 08/09/22 0526 08/10/22 0420 08/11/22 0420 08/12/22 0533 08/13/22 0500  NA  --    < > 140 139 142 143 140  K  --    < > 3.2* 4.2 3.5 3.4* 3.3*  CL  --    < > 97* 100 102 106 104  CO2  --    < >  33* 31 32 28 30  GLUCOSE  --    < > 157* 142* 104* 103* 211*  BUN  --    < > 35* 41* 37* 29* 23  CREATININE  --    < > 1.93* 2.33* 1.56* 1.03* 0.84  CALCIUM  --    < > 8.3* 8.0* 8.5* 8.3* 8.0*  MG 2.1  --   --  1.7 1.9 1.9 1.9  PHOS 8.1*  --   --  3.3 2.9 2.2* 1.8*   < > = values in this interval not displayed.   GFR: Estimated Creatinine Clearance: 54.8 mL/min (by C-G formula based on SCr of 0.84 mg/dL). Liver Function Tests: Recent Labs  Lab 08/08/22 0210 08/09/22 0747 08/10/22 0420 08/11/22 0420 08/12/22 0533  AST 105* 267* 169* 124* 81*  ALT 38 92* 81* 68* 55*  ALKPHOS 88 70 80 81 79  BILITOT 1.8* 0.5 0.5 0.8 1.1  PROT 6.6 5.0* 5.2* 4.9* 4.8*  ALBUMIN 3.9 2.7* 2.6* 2.4* 2.4*   No results for input(s): "LIPASE", "AMYLASE" in the last 168 hours. No results for input(s): "AMMONIA" in the last 168 hours. Coagulation Profile: Recent Labs  Lab 08/08/22 0210 08/09/22 0745  INR 1.2 1.2   Cardiac Enzymes: No results for input(s): "CKTOTAL", "CKMB",  "CKMBINDEX", "TROPONINI" in the last 168 hours. BNP (last 3 results) No results for input(s): "PROBNP" in the last 8760 hours. HbA1C: No results for input(s): "HGBA1C" in the last 72 hours. CBG: Recent Labs  Lab 08/12/22 1936 08/12/22 2349 08/13/22 0401 08/13/22 0743 08/13/22 1135  GLUCAP 181* 287* 211* 148* 99   Lipid Profile: No results for input(s): "CHOL", "HDL", "LDLCALC", "TRIG", "CHOLHDL", "LDLDIRECT" in the last 72 hours.  Thyroid Function Tests: No results for input(s): "TSH", "T4TOTAL", "FREET4", "T3FREE", "THYROIDAB" in the last 72 hours. Anemia Panel: No results for input(s): "VITAMINB12", "FOLATE", "FERRITIN", "TIBC", "IRON", "RETICCTPCT" in the last 72 hours. Sepsis Labs: Recent Labs  Lab 08/08/22 0210 08/08/22 1025 08/09/22 0526 08/09/22 0745 08/10/22 0420  PROCALCITON 0.99  --  7.02  --  5.52  LATICACIDVEN >9.0* >9.0*  --  2.8*  --     Recent Results (from the past 240 hour(s))  Blood Culture (routine x 2)     Status: None   Collection Time: 08/08/22  2:10 AM   Specimen: BLOOD RIGHT ARM  Result Value Ref Range Status   Specimen Description BLOOD RIGHT ARM  Final   Special Requests   Final    BOTTLES DRAWN AEROBIC AND ANAEROBIC Blood Culture adequate volume   Culture   Final    NO GROWTH 5 DAYS Performed at [facility], [address] [place], [place] [address]    Report Status 08/13/2022 FINAL  Final  Blood Culture (routine x 2)     Status: None   Collection Time: 08/08/22  2:10 AM   Specimen: BLOOD  Result Value Ref Range Status   Specimen Description BLOOD BLOOD LEFT ARM  Final   Special Requests   Final    BOTTLES DRAWN AEROBIC AND ANAEROBIC Blood Culture adequate volume   Culture   Final    NO GROWTH 5 DAYS Performed at [facility], [address] [place], [place] [address]    Report Status 08/13/2022 FINAL  Final  Urine Culture     Status: None   Collection Time: 08/08/22  2:10 AM   Specimen: In/Out Cath Urine   Result Value Ref Range Status   Specimen Description  Final    IN/OUT CATH URINE Performed at [facility], [address] [place], [place] [address]    Special Requests   Final    NONE Performed at [facility], [address], [place], [place] [address]    Culture   Final    NO GROWTH Performed at [facility], [place] [address] [place], [place] [address]    Report Status 08/09/2022 FINAL  Final  SARS Coronavirus 2 by RT PCR (hospital order, performed in [facility] hospital lab) *cepheid single result test* Anterior Nasal Swab     Status: None   Collection Time: 08/08/22  5:10 AM   Specimen: Anterior Nasal Swab  Result Value Ref Range Status   SARS Coronavirus 2 by RT PCR NEGATIVE NEGATIVE Final    Comment: (NOTE) SARS-CoV-2 target nucleic acids are NOT DETECTED.  The SARS-CoV-2 RNA is generally detectable in upper and lower respiratory specimens during the acute phase of infection. The lowest concentration of SARS-CoV-2 viral copies this assay can detect is 250 copies / mL. A negative result does not preclude SARS-CoV-2 infection and should not be used as the sole basis for treatment or other patient management decisions.  A negative result may occur with improper specimen collection / handling, submission of specimen other than nasopharyngeal swab, presence of viral mutation(s) within the areas targeted by this assay, and inadequate number of viral copies (<250 copies / mL). A negative result must be combined with clinical observations, patient history, and epidemiological information.  Fact Sheet for Patients:   https://www.patel.info/  Fact Sheet for Healthcare Providers: https://hall.com/  This test is not yet approved or  cleared by the [place] FDA and has been authorized for detection and/or diagnosis of SARS-CoV-2 by FDA under an Emergency Use Authorization (EUA).  This EUA will remain in  effect (meaning this test can be used) for the duration of the COVID-19 declaration under Section 564(b)(1) of the Act, 21 U.S.C. section 360bbb-3(b)(1), unless the authorization is terminated or revoked sooner.  Performed at [facility], [address] [place], [place] [address]   MRSA Next Gen by PCR, Nasal     Status: None   Collection Time: 08/08/22  5:10 AM   Specimen: Anterior Nasal Swab  Result Value Ref Range Status   MRSA by PCR Next Gen NOT DETECTED NOT DETECTED Final    Comment: (NOTE) The GeneXpert MRSA Assay (FDA approved for NASAL specimens only), is one component of a comprehensive MRSA colonization surveillance program. It is not intended to diagnose MRSA infection nor to guide or monitor treatment for MRSA infections. Test performance is not FDA approved in patients less than 7 years old. Performed at [facility], [address] [place], [place] [address]   Culture, Respiratory w Gram Stain     Status: None   Collection Time: 08/09/22  7:54 AM   Specimen: Tracheal Aspirate; Respiratory  Result Value Ref Range Status   Specimen Description   Final    TRACHEAL ASPIRATE Performed at [facility], [address] [place], [place] [address]    Special Requests   Final    NONE Performed at [facility], [address] [place], [place] [address]    Gram Stain   Final    FEW WBC PRESENT,BOTH PMN AND MONONUCLEAR RARE GRAM POSITIVE COCCI IN CLUSTERS RARE BUDDING YEAST SEEN Performed at [facility], [place] [address] [place], [place] [address]    Culture FEW  CANDIDA TROPICALIS  Final   Report Status 08/11/2022 FINAL  Final  Respiratory (~20 pathogens) panel by PCR     Status: None   Collection Time: 08/09/22  7:54 AM   Specimen: Nasopharyngeal Swab; Respiratory  Result Value Ref Range Status   Adenovirus NOT DETECTED NOT DETECTED Final   Coronavirus 229E NOT DETECTED NOT DETECTED Final    Comment: (NOTE) The  Coronavirus on the Respiratory Panel, DOES NOT test for the novel  Coronavirus (2019 nCoV)    Coronavirus HKU1 NOT DETECTED NOT DETECTED Final   Coronavirus NL63 NOT DETECTED NOT DETECTED Final   Coronavirus OC43 NOT DETECTED NOT DETECTED Final   Metapneumovirus NOT DETECTED NOT DETECTED Final   Rhinovirus / Enterovirus NOT DETECTED NOT DETECTED Final   Influenza A NOT DETECTED NOT DETECTED Final   Influenza B NOT DETECTED NOT DETECTED Final   Parainfluenza Virus 1 NOT DETECTED NOT DETECTED Final   Parainfluenza Virus 2 NOT DETECTED NOT DETECTED Final   Parainfluenza Virus 3 NOT DETECTED NOT DETECTED Final   Parainfluenza Virus 4 NOT DETECTED NOT DETECTED Final   Respiratory Syncytial Virus NOT DETECTED NOT DETECTED Final   Bordetella pertussis NOT DETECTED NOT DETECTED Final   Bordetella Parapertussis NOT DETECTED NOT DETECTED Final   Chlamydophila pneumoniae NOT DETECTED NOT DETECTED Final   Mycoplasma pneumoniae NOT DETECTED NOT DETECTED Final    Comment: Performed at [facility], [place]. [address] [place], [place] [address]    Radiology Studies: No results found.  Scheduled Meds:  aspirin  81 mg Per Tube Daily   Chlorhexidine Gluconate Cloth  6 each Topical Daily   dextrose  1 ampule Intravenous Once   feeding supplement  237 mL Oral TID BM   insulin aspart  0-9 Units Subcutaneous Q4H   insulin glargine-yfgn  8 Units Subcutaneous Daily   multivitamin with minerals  1 tablet Oral Daily   mouth rinse  15 mL Mouth Rinse Q6H   pantoprazole (PROTONIX) IV  40 mg Intravenous QHS   senna-docusate  1 tablet Oral BID   sodium chloride flush  3 mL Intravenous Q12H   Continuous Infusions:  sodium chloride     [START ON 08/14/2022] sodium chloride     Followed by   Derrill Memo ON 08/14/2022] sodium chloride     ceFEPime (MAXIPIME) IV 2 g (08/13/22 1046)   heparin 950 Units/hr (08/13/22 0023)   metronidazole 500 mg (08/13/22 0440)   potassium PHOSPHATE IVPB (in mmol) 30 mmol  (08/13/22 1045)     LOS: 5 days    Time spent: 35 mins    Charlett Merkle, MD [facility]   If 7PM-7AM, please contact night-coverage

## 2022-08-13 NOTE — Consult Note (Signed)
PHARMACY CONSULT NOTE  Pharmacy Consult for Electrolyte Monitoring and Replacement   Recent Labs: Potassium (mmol/L)  Date Value  08/13/2022 3.3 (L)   Magnesium (mg/dL)  Date Value  08/13/2022 1.9   Calcium (mg/dL)  Date Value  08/13/2022 8.0 (L)   Albumin (g/dL)  Date Value  08/12/2022 2.4 (L)  07/15/2022 3.9   Phosphorus (mg/dL)  Date Value  08/13/2022 1.8 (L)   Sodium (mmol/L)  Date Value  08/13/2022 140  07/15/2022 138   Corrected Ca: 9.8 mg/dL  Assessment: Pharmacy has been consulted to monitor and replace electrolytes in 81yo female admitted with acute respiratory failure, AKI, DKA, CAD (NSTEMI / CABG), Afib, and lactic acidosis. Patient was transferred to ICU intubated. -extubated, transferred to floor  Goal of Therapy:  Electrolytes WNL; K>4, Mg >2  Plan:  AKI resolved Scr 1.56>1.03>0.84 K: 3.4>3.3,  Phos 2.2>1.8 - Will replace with KPhos 30 IV mmol x1  and KCL 40 meq PO x 1 Mg 1.9 - replace with MgSO 2g IV x1 Recheck electrolytes in AM  Carrie Kane PharmD Clinical Pharmacist 08/13/2022

## 2022-08-13 NOTE — Progress Notes (Signed)
SLP Cancellation Note  Patient Details Name: Jilene Spohr MRN: [id number] DOB: 22-Jul-1941   Cancelled treatment:       Reason Eval/Treat Not Completed: Patient at procedure or test/unavailable (Pt NPO today for cardiac cath)   Shakthi Scipio 08/13/2022, 12:04 PM

## 2022-08-13 NOTE — TOC Initial Note (Signed)
Transition of Care [facility]) - Initial/Assessment Note    Patient Details  Name: Carrie Kane MRN: [id number] Date of Birth: 07-07-1941  Transition of Care [facility] CM/SW Contact:    Beverly Sessions, RN Phone Number: 08/13/2022, 12:06 PM  Clinical Narrative:                    Admitted for: metabolic encephalopathy DKA  Admitted from: home with husband PCP: [facility] - patient states that baseline she or her husband drive to appointments   Therapy recommending SNF Patient in agreement first preference [facility] sent for signature.  Bed search initiated  Message sent to [place] to review        Patient Goals and CMS Choice        Expected Discharge Plan and Services                                                Prior Living Arrangements/Services                       Activities of Daily Living      Permission Sought/Granted                  Emotional Assessment              Admission diagnosis:  Dehydration [E86.0] NSTEMI (non-ST elevated myocardial infarction) ([place]) [I21.4] AKI (acute kidney injury) ([place]) [N17.9] Altered mental status, unspecified altered mental status type [R41.82] Diabetic ketoacidosis without coma associated with type 2 diabetes mellitus ([place]) [I20.35] Acute metabolic encephalopathy [D97.41] Patient Active Problem List   Diagnosis Date Noted   Dehydration    PAF (paroxysmal atrial fibrillation) ([place])    Diabetic ketoacidosis without coma associated with type 2 diabetes mellitus ([place])    Sepsis with acute renal failure and septic shock ([place])    Acute metabolic encephalopathy 63/84/5364   AKI (acute kidney injury) ([place])    Altered mental status    NSTEMI (non-ST elevated myocardial infarction) ([place])    Malnutrition of moderate degree 05/11/2022   Left displaced femoral neck fracture ([place]) 05/10/2022   Fall at home, initial encounter 05/10/2022   Preoperative clearance  05/10/2022   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) 05/10/2022   Essential hypertension 01/18/2021   Claudication ([place]) 12/23/2020   Hypothyroidism 12/23/2020   Anemia 06/17/2020   Weight loss 06/17/2020   Iron deficiency anemia due to chronic blood loss 06/17/2020   Abscess of left foot 06/12/2020   Pre-ulcerative calluses 04/03/2020   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) ([place]) 08/02/2017   Mixed hyperlipidemia 08/02/2017   CAD with Hx of CABG 08/01/2017   Type 2 diabetes mellitus without complication, with long-term current use of insulin ([place]) 04/28/2017   Bruit 04/28/2017   Unstable angina (HCC) 04/28/2017   Abnormal stress test 68/12/2120   Systolic dysfunction 48/25/0037   Acanthosis nigricans 01/26/2012   Disease of hair and hair follicles 04/88/8916   Hirsutism 01/26/2012   Local infection of skin and subcutaneous tissue 01/26/2012   PCP:  Cletis Athens, MD Pharmacy:   [place], [place] [address] Phone: [phone] Fax: [phone]  [id number]  [place] [address] Phone: [phone] Fax: [phone]     Social Determinants of Health (SDOH) Interventions    Readmission Risk Interventions     No data to display

## 2022-08-13 NOTE — Interval H&P Note (Signed)
History and Physical Interval Note:  08/13/2022 2:32 PM  Carrie Kane  has presented today for surgery, with the diagnosis of non-ST elevation myocardial elevation.  The various methods of treatment have been discussed with the patient and family. After consideration of risks, benefits and other options for treatment, the patient has consented to  Procedure(s): LEFT HEART CATH AND CORONARY ANGIOGRAPHY (N/A)  PERCUTANEOUS CORONARY INTERVENTION  as a surgical intervention.  The patient's history has been reviewed, patient examined, no change in status, stable for surgery.  I have reviewed the patient's chart and labs.  Questions were answered to the patient's satisfaction.    Cath Lab Visit (complete for each Cath Lab visit)  Clinical Evaluation Leading to the Procedure:   ACS: Yes.    Non-ACS:     Glenetta Hew

## 2022-08-13 NOTE — Progress Notes (Signed)
Rounding Note    Patient Name: Carrie Kane Date of Encounter: 08/13/2022  [place] Cardiologist: Ida Rogue, MD   Subjective   Reports feeling well, no complaints Denies significant chest pain or shortness of breath Scheduled for cardiac catheterization today 1230  Inpatient Medications    Scheduled Meds:  aspirin  81 mg Per Tube Daily   Chlorhexidine Gluconate Cloth  6 each Topical Daily   dextrose  1 ampule Intravenous Once   feeding supplement  237 mL Oral TID BM   insulin aspart  0-9 Units Subcutaneous Q4H   insulin glargine-yfgn  8 Units Subcutaneous Daily   multivitamin with minerals  1 tablet Oral Daily   mouth rinse  15 mL Mouth Rinse Q6H   pantoprazole (PROTONIX) IV  40 mg Intravenous QHS   senna-docusate  1 tablet Oral BID   sodium chloride flush  3 mL Intravenous Q12H   Continuous Infusions:  sodium chloride     [START ON 08/14/2022] sodium chloride     Followed by   Derrill Memo ON 08/14/2022] sodium chloride     ceFEPime (MAXIPIME) IV 2 g (08/12/22 2224)   heparin 950 Units/hr (08/13/22 0023)   metronidazole 500 mg (08/13/22 0440)   potassium PHOSPHATE IVPB (in mmol)     PRN Meds: sodium chloride, dextrose, ondansetron (ZOFRAN) IV, mouth rinse, polyethylene glycol, sodium chloride flush   Vital Signs    Vitals:   08/12/22 1642 08/12/22 1934 08/13/22 0317 08/13/22 0743  BP: (!) 133/58 (!) 135/50 112/64 (!) 119/55  Pulse: 85 84 81 75  Resp: '18 20 20 18  '$ Temp: 98.6 F (37 C) 99.5 F (37.5 C) 98.3 F (36.8 C) 98.7 F (37.1 C)  TempSrc: Oral Oral  Oral  SpO2: 100% 98% 96% 98%  Weight:      Height:        Intake/Output Summary (Last 24 hours) at 08/13/2022 0922 Last data filed at 08/13/2022 8938 Gross per 24 hour  Intake 1319.91 ml  Output 900 ml  Net 419.91 ml      08/12/2022    5:00 AM 08/11/2022    4:31 AM 08/10/2022    4:47 AM  Last 3 Weights  Weight (lbs) 161 lb 2.5 oz 158 lb 8.2 oz 160 lb 0.9 oz  Weight (kg)  73.1 kg 71.9 kg 72.6 kg      Telemetry    Normal sinus rhythm- Personally Reviewed  ECG     - Personally Reviewed  Physical Exam   GEN: No acute distress.   Neck: No JVD Cardiac: RRR, no murmurs, rubs, or gallops.  Respiratory: Clear to auscultation bilaterally. GI: Soft, nontender, non-distended  MS: No edema; No deformity. Neuro:  Nonfocal  Psych: Normal affect   Labs    High Sensitivity Troponin:   Recent Labs  Lab 08/08/22 0210 08/08/22 0513 08/09/22 0745 08/09/22 1048 08/10/22 0639  TROPONINIHS 3,038* 15,838* >24,000* >24,000* >24,000*     Chemistry Recent Labs  Lab 08/10/22 0420 08/11/22 0420 08/12/22 0533 08/13/22 0500  NA 139 142 143 140  K 4.2 3.5 3.4* 3.3*  CL 100 102 106 104  CO2 31 32 28 30  GLUCOSE 142* 104* 103* 211*  BUN 41* 37* 29* 23  CREATININE 2.33* 1.56* 1.03* 0.84  CALCIUM 8.0* 8.5* 8.3* 8.0*  MG 1.7 1.9 1.9 1.9  PROT 5.2* 4.9* 4.8*  --   ALBUMIN 2.6* 2.4* 2.4*  --   AST 169* 124* 81*  --   ALT 81* 68*  55*  --   ALKPHOS 80 81 79  --   BILITOT 0.5 0.8 1.1  --   GFRNONAA 21* 34* 55* >60  ANIONGAP '8 8 9 6    '$ Lipids  Recent Labs  Lab 08/09/22 0526  TRIG 64    Hematology Recent Labs  Lab 08/10/22 0420 08/11/22 0420 08/12/22 0533  WBC 16.8* 8.8 7.8  RBC 3.38* 3.06* 2.85*  HGB 10.0* 9.1* 8.7*  HCT 30.6* 28.2* 26.3*  MCV 90.5 92.2 92.3  MCH 29.6 29.7 30.5  MCHC 32.7 32.3 33.1  RDW 14.8 14.9 14.5  PLT 173 131* 137*   Thyroid  Recent Labs  Lab 08/08/22 0513  TSH 0.272*  FREET4 1.40*    BNP Recent Labs  Lab 08/08/22 0210  BNP 261.2*    DDimer No results for input(s): "DDIMER" in the last 168 hours.   Radiology    No results found.  Cardiac Studies    Echocardiogram August 08, 2022   1. Left ventricular ejection fraction, by estimation, is 65 to 70%. The  left ventricle has normal function. The left ventricle has no regional  wall motion abnormalities. There is moderate asymmetric left ventricular   hypertrophy of the septal segment.  Left ventricular diastolic parameters are consistent with Grade I  diastolic dysfunction (impaired relaxation). Elevated left ventricular  end-diastolic pressure.   2. Right ventricular systolic function is normal. The right ventricular  size is normal. There is mildly elevated pulmonary artery systolic  pressure.   3. Left atrial size was mildly dilated.   4. The mitral valve is normal in structure. Trivial mitral valve  regurgitation. No evidence of mitral stenosis.   5. Tricuspid valve regurgitation is mild to moderate.   6. The aortic valve is tricuspid. Aortic valve regurgitation is not  visualized. No aortic stenosis is present.   7. Aortic dilatation noted. There is mild dilatation of the ascending  aorta, measuring 37 mm.   8. The inferior vena cava is dilated in size with <50% respiratory  variability, suggesting right atrial pressure of 15 mmHg.   Patient Profile     81 y.o. female with history of CAD s/p CABG in 2018, HFpEF, CVA in 01/2022, SVT, NSVT, PAD s/p PTA, DM, HTN, and HLD who was admitted with acute hypoxic respiratory failure requiring mechanical ventilation with encephalopathy in the setting of DKA with metabolic derangement, undifferentiated shock and sepsis of unclear etiology, cardiology consult for NSTEMI and new onset Afib.   Assessment & Plan    Acute hypoxic respiratory failure with encephalopathy Presenting with DKA, metabolic derangement, renal failure, shock, sepsis, non-STEMI, ATN Placed on pressors, antibiotics, treatment of DKA, IV fluids,  Mentating close to baseline, renal function close to baseline Remains on antibiotics -Stable lab work, blood pressure stable   NSTEMI Markedly elevated troponin greater than 24,000 History of coronary artery disease, CABG Normal ejection fraction Cardiac catheterization scheduled for noon today Renal function stable, n.p.o. this morning   Atrial fibrillation,  paroxysmal In the setting of respiratory failure, shock picture Now maintaining normal sinus rhythm On IV heparin,  At the time of discharge will need to be transitioned to NOAC   PAD: Managed by Dr. Lucky Cowboy Prior angioplasty left anterior tibial artery, right peroneal artery, right popliteal, distal SFA in May 2022 Prior history of ulceration left lower extremity   History of poorly controlled diabetes with complications Insulin-dependent Last A1c end of 2022 8.9   ATN In the setting of shock, sepsis, DKA, longstanding diabetes  Renal function dramatically improved Creatinine down to baseline   Greater than 50% was spent in counseling and coordination of care with patient Total encounter time 35 minutes or more  For questions or updates, please contact [place] Please consult [email or web address] for contact info under        Signed, Ida Rogue, MD  08/13/2022, 9:22 AM

## 2022-08-13 NOTE — Progress Notes (Signed)
OT Cancellation Note  Patient Details Name: Carrie Kane MRN: [id number] DOB: September 21, 1941   Cancelled Treatment:    Reason Eval/Treat Not Completed: Patient at procedure or test/ unavailable. Pt out of room for cardiac cath per chart. Will re-attempt OT tx at later date/time as medically appropriate.   Ardeth Perfect., MPH, MS, OTR/L ascom [phone] 08/13/22, 3:55 PM

## 2022-08-13 NOTE — TOC Progression Note (Signed)
Transition of Care [facility]) - Progression Note    Patient Details  Name: Carrie Kane MRN: [id number] Date of Birth: [birth year]  [facility]) CM/SW Contact  Beverly Sessions, RN Phone Number: 08/13/2022, 2:28 PM  Clinical Narrative:      [facility] is able to offer a bed. Patient would like to accept.  Will need auth prior to discharge Per Magda Paganini at [facility] they do not accept patients over the weekends       Expected Discharge Plan and Services                                                 Social Determinants of Health (SDOH) Interventions    Readmission Risk Interventions    08/13/2022   12:08 PM  Readmission Risk Prevention Plan  Transportation Screening Complete  HRI or Home Care Consult Complete  Palliative Care Screening Not Applicable  Medication Review (RN Care Manager) Complete

## 2022-08-13 NOTE — Consult Note (Signed)
Carrie Kane, Carrie Kane [id number] 07-26-1941 Shawna Clamp, MD  Reason for Consult: Dysphonia  HPI: 81 year old female admitted for altered mental status and requiring ventilatory support for 2 days presents with dysphonia following extubation.  Patient's diet was advanced today and reports are that she tolerated it well.  Patient denies any throat pain or issues.  Reports she is hoarse and this is a new change from prior to intubation.  Patient denies breathing difficulty today.  She has also been diagnosed with NSTEMI and on heparin drip.  Allergies:  Allergies  Allergen Reactions   Shellfish Allergy Anaphylaxis    Throat swelling   Flexeril [Cyclobenzaprine] Hypertension    ROS: Review of systems normal other than 12 systems except per HPI.  PMH:  Past Medical History:  Diagnosis Date   CAD (coronary artery disease)    a. 04/2017 Cath: LM 71, LAD 80p, 23m LCX 95ost, 758mEF 45-50%; b. 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   Diastolic dysfunction    a. 04/2017 Echo: EF 55-60%, no rwma, Gr1 DD, mildly dil LA; b. 08/2020 Echo: EF 55-60%, no rwma, GrII DD, nl RV fxn, RVSP 3039m, mod dil LA, mildly dil RA, Mod TR.   Hyperlipidemia    Hypertensive heart disease    Insulin dependent diabetes mellitus    PAD (peripheral artery disease) ([facility]  a. 02/2021 PTA/DCBA R Peroneal, R Popliteal, distal R SFA.   PSVT (paroxysmal supraventricular tachycardia)    a. 02/2022 [place]: Predominantly sinus rhythm @ 61 (36-218). 2 NSVT runs (fastest/longest 6 beats @ 218). 37 SVT/A tach runs (fastest 185 x 5 beats, longest 32.4 secs @ 107). Triggered events = RSR, PAC.   [facility]  a. 01/2022 R sided wkns/aphasia/tremor/slurred speech-->Ss resolved; b. 02/2022 MRI/A: punctate subacute inf vs artifact-post limb of L int capsule. 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 Mod, chronic small vessel isch changes within the cerebral white matter.  Subcm chronic infarct- L  cerebellar hemisphere.  Sev dzs prox P2 seg of R PCA. Mod dzs A1 R antClinical cytogeneticist  FH:  Family History  Problem Relation Age of Onset   Breast cancer Maternal Aunt     SH:  Social History   Socioeconomic History   Marital status: Married    Spouse name: RobHerbie [place] Number of children: 1   Years of education: Not on file   Highest education level: Not on file  Occupational History   Not on file  Tobacco Use   Smoking status: Never   Smokeless tobacco: Never  Vaping Use   Vaping Use: Never used  Substance and Sexual Activity   Alcohol use: No   Drug use: No   Sexual activity: Not on file  Other Topics Concern   Not on file  Social History Narrative   [place] in [place] her [place].  She is responsible for driving and grocery shopping.   Social Determinants of Health   Financial Resource Strain: Low Risk  (10/30/2021)   Overall Financial Resource Strain (CARDIA)    Difficulty of Paying Living Expenses: Not hard at all  Food Insecurity: No Food Insecurity (10/30/2021)   Hunger Vital Sign    Worried About Running Out of Food in the Last Year: Never true    Ran Out of Food in the Last Year: Never true  Transportation Needs: No Transportation Needs (10/30/2021)   PRAPARE - TraHydrologistedical): No  Lack of Transportation (Non-Medical): No  Physical Activity: Sufficiently Active (10/30/2021)   Exercise Vital Sign    Days of Exercise per Week: 4 days    Minutes of Exercise per Session: 40 min  Stress: No Stress Concern Present (10/30/2021)   [place]    Feeling of Stress : Not at all  Social Connections: [place] (10/30/2021)   Social Connection and Isolation Panel [NHANES]    Frequency of Communication with Friends and Family: More than three times a week    Frequency of Social Gatherings with Friends and Family: More than three times a week    Attends  Religious Services: More than 4 times per year    Active Member of Clubs or Organizations: Yes    Attends Archivist Meetings: More than 4 times per year    Marital Status: Married  Human resources officer Violence: Not At Risk (10/30/2021)   Humiliation, Afraid, Rape, and Kick questionnaire    Fear of Current or Ex-Partner: No    Emotionally Abused: No    Physically Abused: No    Sexually Abused: No    PSH:  Past Surgical History:  Procedure Laterality Date   ABDOMINAL HYSTERECTOMY     APPLICATION OF WOUND VAC  05/11/2022   Procedure: APPLICATION OF WOUND VAC;  Surgeon: Renee Harder, MD;  Location: [facility];  Service: Orthopedics;;   CARDIAC CATHETERIZATION     CATARACT EXTRACTION W/PHACO Right 10/26/2021   Procedure: CATARACT EXTRACTION PHACO AND INTRAOCULAR LENS PLACEMENT ([place]) RIGHT DIABETIC 16.27 01:23.8;  Surgeon: Eulogio Bear, MD;  Location: [place];  Service: Ophthalmology;  Laterality: Right;  Please leave arrival at 8:00   CATARACT EXTRACTION W/PHACO Left 11/09/2021   Procedure: CATARACT EXTRACTION PHACO AND INTRAOCULAR LENS PLACEMENT ([place]) LEFT DIABETIC;  Surgeon: Eulogio Bear, MD;  Location: [place];  Service: Ophthalmology;  Laterality: Left;  13.39 01:13.0   CORONARY ARTERY BYPASS GRAFT N/A 05/09/2017   Procedure: CORONARY ARTERY BYPASS GRAFTING (CABG) x 3 using left internal mammary artery and right greater saphenous vein harvested endoscopically;  Surgeon: Ivin Poot, MD;  Location: [place];  Service: Open Heart Surgery;  Laterality: N/A;   EYE SURGERY     HIP ARTHROPLASTY Left 05/11/2022   Procedure: ARTHROPLASTY BIPOLAR HIP (HEMIARTHROPLASTY);  Surgeon: Renee Harder, MD;  Location: [facility];  Service: Orthopedics;  Laterality: Left;   INTRAOPERATIVE TRANSESOPHAGEAL ECHOCARDIOGRAM N/A 05/09/2017   Procedure: INTRAOPERATIVE TRANSESOPHAGEAL ECHOCARDIOGRAM;  Surgeon: Ivin Poot, MD;  Location: [place];  Service: Open  Heart Surgery;  Laterality: N/A;   IR RADIOLOGIST EVAL & MGMT  12/04/2020   LEFT HEART CATH AND CORONARY ANGIOGRAPHY N/A 05/02/2017   Procedure: Left Heart Cath and Coronary Angiography;  Surgeon: Wellington Hampshire, MD;  Location: [place] CV LAB;  Service: Cardiovascular;  Laterality: N/A;   LOWER EXTREMITY ANGIOGRAPHY Left 02/09/2021   Procedure: LOWER EXTREMITY ANGIOGRAPHY;  Surgeon: Algernon Huxley, MD;  Location: [place] CV [facility];  Service: Cardiovascular;  Laterality: Left;   LOWER EXTREMITY ANGIOGRAPHY Right 02/16/2021   Procedure: LOWER EXTREMITY ANGIOGRAPHY;  Surgeon: Algernon Huxley, MD;  Location: [place] [facility] LAB;  Service: Cardiovascular;  Laterality: Right;    Physical  Exam:  GEN- supine in bed NAD NEURO- CN 2-12 grossly intact and symmetric. EARS- external ears clear NOSE- clear bilaterally with no mucopus or polyps OC/OP- no masses or lesions NECK- supply with no LAD RESP- unlabored  Procedure:  Trans-nasal flexible laryngoscopy-  After verbal consent obtained, the patient's left nasal cavity was anesthetized with Sinex and Lidocaine 2%.  A flexible fiberoptic scope was inserted into the patient's left nasal cavity and advanced for visualization of the patient's nasopharynx, pharynx, and larynx.  This demonstrated no abnormal masses or lesions.  Bilateral true vocal folds were mobile.  Moderate bowing of the vocal folds were present with mild diffuse edema.  Patient did not tolerate the procedure well and limited evaluation of mobility of vocal folds for subtle weakness was not able to be done.  No pooling of secretions was present.   A/P: Dysphonia following extubation with no obvious vocal fold weakness  Plan:  Laryngoscopy did not show any significant vocal fold abnormality in movement but patient did not tolerate the exam well so subtle weakness of vocal folds still possible.  Patient had mild edema as expected from intubation as well as poor breath support from  limited activity and I suspect that all is playing a role here.  Given no concerns regarding aspiration, I would recommend observation and anticipate improvement once patient becomes more mobile and the farther we get from the extubation.  If dysphonia persists, patient will need formal strobeoscopy as an outpatient.   Aarish Rockers 08/13/2022 8:15 AM

## 2022-08-13 NOTE — H&P (View-Only) (Signed)
Rounding Note    Patient Name: Carrie Kane Date of Encounter: 08/13/2022  [place] Cardiologist: Ida Rogue, MD   Subjective   Reports feeling well, no complaints Denies significant chest pain or shortness of breath Scheduled for cardiac catheterization today 1230  Inpatient Medications    Scheduled Meds:  aspirin  81 mg Per Tube Daily   Chlorhexidine Gluconate Cloth  6 each Topical Daily   dextrose  1 ampule Intravenous Once   feeding supplement  237 mL Oral TID BM   insulin aspart  0-9 Units Subcutaneous Q4H   insulin glargine-yfgn  8 Units Subcutaneous Daily   multivitamin with minerals  1 tablet Oral Daily   mouth rinse  15 mL Mouth Rinse Q6H   pantoprazole (PROTONIX) IV  40 mg Intravenous QHS   senna-docusate  1 tablet Oral BID   sodium chloride flush  3 mL Intravenous Q12H   Continuous Infusions:  sodium chloride     [START ON 08/14/2022] sodium chloride     Followed by   Derrill Memo ON 08/14/2022] sodium chloride     ceFEPime (MAXIPIME) IV 2 g (08/12/22 2224)   heparin 950 Units/hr (08/13/22 0023)   metronidazole 500 mg (08/13/22 0440)   potassium PHOSPHATE IVPB (in mmol)     PRN Meds: sodium chloride, dextrose, ondansetron (ZOFRAN) IV, mouth rinse, polyethylene glycol, sodium chloride flush   Vital Signs    Vitals:   08/12/22 1642 08/12/22 1934 08/13/22 0317 08/13/22 0743  BP: (!) 133/58 (!) 135/50 112/64 (!) 119/55  Pulse: 85 84 81 75  Resp: '18 20 20 18  '$ Temp: 98.6 F (37 C) 99.5 F (37.5 C) 98.3 F (36.8 C) 98.7 F (37.1 C)  TempSrc: Oral Oral  Oral  SpO2: 100% 98% 96% 98%  Weight:      Height:        Intake/Output Summary (Last 24 hours) at 08/13/2022 0922 Last data filed at 08/13/2022 5366 Gross per 24 hour  Intake 1319.91 ml  Output 900 ml  Net 419.91 ml      08/12/2022    5:00 AM 08/11/2022    4:31 AM 08/10/2022    4:47 AM  Last 3 Weights  Weight (lbs) 161 lb 2.5 oz 158 lb 8.2 oz 160 lb 0.9 oz  Weight (kg)  73.1 kg 71.9 kg 72.6 kg      Telemetry    Normal sinus rhythm- Personally Reviewed  ECG     - Personally Reviewed  Physical Exam   GEN: No acute distress.   Neck: No JVD Cardiac: RRR, no murmurs, rubs, or gallops.  Respiratory: Clear to auscultation bilaterally. GI: Soft, nontender, non-distended  MS: No edema; No deformity. Neuro:  Nonfocal  Psych: Normal affect   Labs    High Sensitivity Troponin:   Recent Labs  Lab 08/08/22 0210 08/08/22 0513 08/09/22 0745 08/09/22 1048 08/10/22 0639  TROPONINIHS 3,038* 15,838* >24,000* >24,000* >24,000*     Chemistry Recent Labs  Lab 08/10/22 0420 08/11/22 0420 08/12/22 0533 08/13/22 0500  NA 139 142 143 140  K 4.2 3.5 3.4* 3.3*  CL 100 102 106 104  CO2 31 32 28 30  GLUCOSE 142* 104* 103* 211*  BUN 41* 37* 29* 23  CREATININE 2.33* 1.56* 1.03* 0.84  CALCIUM 8.0* 8.5* 8.3* 8.0*  MG 1.7 1.9 1.9 1.9  PROT 5.2* 4.9* 4.8*  --   ALBUMIN 2.6* 2.4* 2.4*  --   AST 169* 124* 81*  --   ALT 81* 68*  55*  --   ALKPHOS 80 81 79  --   BILITOT 0.5 0.8 1.1  --   GFRNONAA 21* 34* 55* >60  ANIONGAP '8 8 9 6    '$ Lipids  Recent Labs  Lab 08/09/22 0526  TRIG 64    Hematology Recent Labs  Lab 08/10/22 0420 08/11/22 0420 08/12/22 0533  WBC 16.8* 8.8 7.8  RBC 3.38* 3.06* 2.85*  HGB 10.0* 9.1* 8.7*  HCT 30.6* 28.2* 26.3*  MCV 90.5 92.2 92.3  MCH 29.6 29.7 30.5  MCHC 32.7 32.3 33.1  RDW 14.8 14.9 14.5  PLT 173 131* 137*   Thyroid  Recent Labs  Lab 08/08/22 0513  TSH 0.272*  FREET4 1.40*    BNP Recent Labs  Lab 08/08/22 0210  BNP 261.2*    DDimer No results for input(s): "DDIMER" in the last 168 hours.   Radiology    No results found.  Cardiac Studies    Echocardiogram August 08, 2022   1. Left ventricular ejection fraction, by estimation, is 65 to 70%. The  left ventricle has normal function. The left ventricle has no regional  wall motion abnormalities. There is moderate asymmetric left ventricular   hypertrophy of the septal segment.  Left ventricular diastolic parameters are consistent with Grade I  diastolic dysfunction (impaired relaxation). Elevated left ventricular  end-diastolic pressure.   2. Right ventricular systolic function is normal. The right ventricular  size is normal. There is mildly elevated pulmonary artery systolic  pressure.   3. Left atrial size was mildly dilated.   4. The mitral valve is normal in structure. Trivial mitral valve  regurgitation. No evidence of mitral stenosis.   5. Tricuspid valve regurgitation is mild to moderate.   6. The aortic valve is tricuspid. Aortic valve regurgitation is not  visualized. No aortic stenosis is present.   7. Aortic dilatation noted. There is mild dilatation of the ascending  aorta, measuring 37 mm.   8. The inferior vena cava is dilated in size with <50% respiratory  variability, suggesting right atrial pressure of 15 mmHg.   Patient Profile     81 y.o. female with history of CAD s/p CABG in 2018, HFpEF, CVA in 01/2022, SVT, NSVT, PAD s/p PTA, DM, HTN, and HLD who was admitted with acute hypoxic respiratory failure requiring mechanical ventilation with encephalopathy in the setting of DKA with metabolic derangement, undifferentiated shock and sepsis of unclear etiology, cardiology consult for NSTEMI and new onset Afib.   Assessment & Plan    Acute hypoxic respiratory failure with encephalopathy Presenting with DKA, metabolic derangement, renal failure, shock, sepsis, non-STEMI, ATN Placed on pressors, antibiotics, treatment of DKA, IV fluids,  Mentating close to baseline, renal function close to baseline Remains on antibiotics -Stable lab work, blood pressure stable   NSTEMI Markedly elevated troponin greater than 24,000 History of coronary artery disease, CABG Normal ejection fraction Cardiac catheterization scheduled for noon today Renal function stable, n.p.o. this morning   Atrial fibrillation,  paroxysmal In the setting of respiratory failure, shock picture Now maintaining normal sinus rhythm On IV heparin,  At the time of discharge will need to be transitioned to NOAC   PAD: Managed by Dr. Lucky Cowboy Prior angioplasty left anterior tibial artery, right peroneal artery, right popliteal, distal SFA in May 2022 Prior history of ulceration left lower extremity   History of poorly controlled diabetes with complications Insulin-dependent Last A1c end of 2022 8.9   ATN In the setting of shock, sepsis, DKA, longstanding diabetes  Renal function dramatically improved Creatinine down to baseline   Greater than 50% was spent in counseling and coordination of care with patient Total encounter time 35 minutes or more  For questions or updates, please contact [place] Please consult [email or web address] for contact info under        Signed, Ida Rogue, MD  08/13/2022, 9:22 AM

## 2022-08-14 DIAGNOSIS — G9341 Metabolic encephalopathy: Secondary | ICD-10-CM | POA: Diagnosis not present

## 2022-08-14 LAB — CBC
HCT: 30.5 % — ABNORMAL LOW (ref 36.0–46.0)
Hemoglobin: 9.8 g/dL — ABNORMAL LOW (ref 12.0–15.0)
MCH: 29.2 pg (ref 26.0–34.0)
MCHC: 32.1 g/dL (ref 30.0–36.0)
MCV: 90.8 fL (ref 80.0–100.0)
Platelets: 191 10*3/uL (ref 150–400)
RBC: 3.36 MIL/uL — ABNORMAL LOW (ref 3.87–5.11)
RDW: 14.5 % (ref 11.5–15.5)
WBC: 7 10*3/uL (ref 4.0–10.5)
nRBC: 0 % (ref 0.0–0.2)

## 2022-08-14 LAB — BASIC METABOLIC PANEL
Anion gap: 17 — ABNORMAL HIGH (ref 5–15)
BUN: 24 mg/dL — ABNORMAL HIGH (ref 8–23)
CO2: 18 mmol/L — ABNORMAL LOW (ref 22–32)
Calcium: 8.3 mg/dL — ABNORMAL LOW (ref 8.9–10.3)
Chloride: 104 mmol/L (ref 98–111)
Creatinine, Ser: 0.99 mg/dL (ref 0.44–1.00)
GFR, Estimated: 58 mL/min — ABNORMAL LOW (ref >60)
Glucose, Bld: 371 mg/dL — ABNORMAL HIGH (ref 70–99)
Potassium: 5.2 mmol/L — ABNORMAL HIGH (ref 3.5–5.1)
Sodium: 139 mmol/L (ref 135–145)

## 2022-08-14 LAB — POTASSIUM: Potassium: 4.8 mmol/L (ref 3.5–5.1)

## 2022-08-14 LAB — GLUCOSE, CAPILLARY
Glucose-Capillary: 273 mg/dL — ABNORMAL HIGH (ref 70–99)
Glucose-Capillary: 310 mg/dL — ABNORMAL HIGH (ref 70–99)
Glucose-Capillary: 317 mg/dL — ABNORMAL HIGH (ref 70–99)
Glucose-Capillary: 343 mg/dL — ABNORMAL HIGH (ref 70–99)
Glucose-Capillary: 415 mg/dL — ABNORMAL HIGH (ref 70–99)
Glucose-Capillary: 461 mg/dL — ABNORMAL HIGH (ref 70–99)

## 2022-08-14 LAB — PHOSPHORUS: Phosphorus: 3.9 mg/dL (ref 2.5–4.6)

## 2022-08-14 LAB — MAGNESIUM: Magnesium: 2.3 mg/dL (ref 1.7–2.4)

## 2022-08-14 MED ORDER — ISOSORBIDE MONONITRATE ER 30 MG PO TB24
15.0000 mg | ORAL_TABLET | Freq: Every day | ORAL | Status: DC
  Administered 2022-08-14 – 2022-08-16 (×3): 15 mg via ORAL
  Filled 2022-08-14 (×3): qty 1

## 2022-08-14 MED ORDER — CLOPIDOGREL BISULFATE 75 MG PO TABS
75.0000 mg | ORAL_TABLET | Freq: Every day | ORAL | Status: DC
  Administered 2022-08-14 – 2022-08-16 (×3): 75 mg via ORAL
  Filled 2022-08-14 (×3): qty 1

## 2022-08-14 MED ORDER — APIXABAN 5 MG PO TABS
5.0000 mg | ORAL_TABLET | Freq: Two times a day (BID) | ORAL | Status: DC
  Administered 2022-08-14 – 2022-08-16 (×4): 5 mg via ORAL
  Filled 2022-08-14 (×4): qty 1

## 2022-08-14 MED ORDER — ATORVASTATIN CALCIUM 80 MG PO TABS
80.0000 mg | ORAL_TABLET | Freq: Every day | ORAL | Status: DC
  Administered 2022-08-14 – 2022-08-16 (×3): 80 mg via ORAL
  Filled 2022-08-14 (×3): qty 1

## 2022-08-14 MED ORDER — FUROSEMIDE 40 MG PO TABS
40.0000 mg | ORAL_TABLET | Freq: Once | ORAL | Status: AC
  Administered 2022-08-14: 40 mg via ORAL
  Filled 2022-08-14: qty 1

## 2022-08-14 NOTE — Consult Note (Signed)
[place] for Electrolyte Monitoring and Replacement   Recent Labs: Potassium (mmol/L)  Date Value  08/14/2022 5.2 (H)   Magnesium (mg/dL)  Date Value  08/14/2022 2.3   Calcium (mg/dL)  Date Value  08/14/2022 8.3 (L)   Albumin (g/dL)  Date Value  08/12/2022 2.4 (L)  07/15/2022 3.9   Phosphorus (mg/dL)  Date Value  08/14/2022 3.9   Sodium (mmol/L)  Date Value  08/14/2022 139  07/15/2022 138    Assessment: Pharmacy has been consulted to monitor and replace electrolytes in 81yo female admitted with acute respiratory failure, AKI, DKA, CAD (NSTEMI / CABG), Afib, and lactic acidosis. Patient was transferred to ICU intubated. -extubated, transferred to floor  Goal of Therapy:  Electrolytes WNL; K>4, Mg >2  Plan:  No replacement required at this time. Defer correction of potassium to primary team.  Recheck electrolytes in AM  Eleonore Chiquito, PharmD Clinical Pharmacist 08/14/2022

## 2022-08-14 NOTE — Progress Notes (Signed)
Rounding Note    Patient Name: Carrie Kane Date of Encounter: 08/14/2022  [place] Cardiologist: Ida Rogue, MD   Subjective   Patient denies chest pain, reports persistent unchanged SOB. Cath site, right groin, appears stable.Says she has been up to the bathroom.   Inpatient Medications    Scheduled Meds:  apixaban  5 mg Oral BID   atorvastatin  80 mg Oral Daily   Chlorhexidine Gluconate Cloth  6 each Topical Daily   clopidogrel  75 mg Oral Daily   dextrose  1 ampule Intravenous Once   feeding supplement  237 mL Oral TID BM   insulin aspart  0-9 Units Subcutaneous Q4H   insulin glargine-yfgn  8 Units Subcutaneous Daily   multivitamin with minerals  1 tablet Oral Daily   mouth rinse  15 mL Mouth Rinse Q6H   pantoprazole (PROTONIX) IV  40 mg Intravenous QHS   senna-docusate  1 tablet Oral BID   sodium chloride flush  3 mL Intravenous Q12H   sodium chloride flush  3 mL Intravenous Q12H   Continuous Infusions:  sodium chloride 250 mL (08/14/22 0114)   PRN Meds: sodium chloride, acetaminophen, alum & mag hydroxide-simeth, dextrose, ondansetron (ZOFRAN) IV, mouth rinse, polyethylene glycol, sodium chloride flush   Vital Signs    Vitals:   08/13/22 2323 08/14/22 0311 08/14/22 0446 08/14/22 0749  BP: 118/67 (!) 117/58  (!) 105/57  Pulse: 97 (!) 104  93  Resp:  18  18  Temp:  97.9 F (36.6 C)  97.7 F (36.5 C)  TempSrc:    Oral  SpO2: 99% 92%  98%  Weight:   75 kg   Height:        Intake/Output Summary (Last 24 hours) at 08/14/2022 0823 Last data filed at 08/14/2022 0749 Gross per 24 hour  Intake --  Output 900 ml  Net -900 ml      08/14/2022    4:46 AM 08/12/2022    5:00 AM 08/11/2022    4:31 AM  Last 3 Weights  Weight (lbs) 165 lb 5.5 oz 161 lb 2.5 oz 158 lb 8.2 oz  Weight (kg) 75 kg 73.1 kg 71.9 kg      Telemetry    SR HR 90-100 - Personally Reviewed  ECG    No new - Personally Reviewed  Physical Exam   GEN: No acute  distress.   Neck: No JVD Cardiac: RRR, no murmurs, rubs, or gallops.  Respiratory: Clear to auscultation bilaterally. GI: Soft, nontender, non-distended  MS: No edema; No deformity. Neuro:  Nonfocal  Psych: Normal affect   Labs    High Sensitivity Troponin:   Recent Labs  Lab 08/08/22 0210 08/08/22 0513 08/09/22 0745 08/09/22 1048 08/10/22 0639  TROPONINIHS 3,038* 15,838* >24,000* >24,000* >24,000*     Chemistry Recent Labs  Lab 08/10/22 0420 08/11/22 0420 08/12/22 0533 08/13/22 0500 08/13/22 1528 08/13/22 1536 08/14/22 0639  NA 139 142 143 140 139 139 139  K 4.2 3.5 3.4* 3.3* 4.1 4.1 5.2*  CL 100 102 106 104  --   --  104  CO2 31 32 28 30  --   --  18*  GLUCOSE 142* 104* 103* 211*  --   --  371*  BUN 41* 37* 29* 23  --   --  24*  CREATININE 2.33* 1.56* 1.03* 0.84  --   --  0.99  CALCIUM 8.0* 8.5* 8.3* 8.0*  --   --  8.3*  MG 1.7  1.9 1.9 1.9  --   --  2.3  PROT 5.2* 4.9* 4.8*  --   --   --   --   ALBUMIN 2.6* 2.4* 2.4*  --   --   --   --   AST 169* 124* 81*  --   --   --   --   ALT 81* 68* 55*  --   --   --   --   ALKPHOS 80 81 79  --   --   --   --   BILITOT 0.5 0.8 1.1  --   --   --   --   GFRNONAA 21* 34* 55* >60  --   --  58*  ANIONGAP '8 8 9 6  '$ --   --  17*    Lipids  Recent Labs  Lab 08/09/22 0526  TRIG 64    Hematology Recent Labs  Lab 08/11/22 0420 08/12/22 0533 08/13/22 1528 08/13/22 1536 08/14/22 0639  WBC 8.8 7.8  --   --  7.0  RBC 3.06* 2.85*  --   --  3.36*  HGB 9.1* 8.7* 8.5* 8.5* 9.8*  HCT 28.2* 26.3* 25.0* 25.0* 30.5*  MCV 92.2 92.3  --   --  90.8  MCH 29.7 30.5  --   --  29.2  MCHC 32.3 33.1  --   --  32.1  RDW 14.9 14.5  --   --  14.5  PLT 131* 137*  --   --  191   Thyroid  Recent Labs  Lab 08/08/22 0513  TSH 0.272*  FREET4 1.40*    BNP Recent Labs  Lab 08/08/22 0210  BNP 261.2*    DDimer No results for input(s): "DDIMER" in the last 168 hours.   Radiology    CARDIAC CATHETERIZATION  Result Date: 08/13/2022    Mid LM to Ost LAD lesion is 95% stenosed with 90% stenosed side branch in Ost Cx.   Ost LAD to Prox LAD lesion is 100% stenosed.   Mid LAD lesion is 80% stenosed with 100% stenosed side branch in 2nd Diag.   Ost Cx to Prox Cx lesion is 100% stenosed.   Dist Cx lesion is 70% stenosed.   Prox RCA lesion is 65% stenosed.   LIMA graft was visualized by angiography and is normal in caliber. The graft exhibits no disease. Widely patent   SVG-D1 graft was visualized by angiography.  The graft exhibits no disease.  Widely patent; diagonal does not retrograde fill the LAD   SVG-OM graft was visualized by angiography and is normal in caliber.  The graft exhibits no disease.Widely patent to a very trivial downstream vessel.   --------------------   LV end diastolic pressure is mildly elevated.   There is no aortic valve stenosis.   -------------------- POSTOP DIAGNOSES Severe disease of the native left coronary artery system with distal Left Main 90%, severe disease in the small AV groove Circumflex with occlusion of the proximal LAD and large dominant OM with minimal competitive flow. Small caliber heavily calcified Right Coronary Artery with proximal 60 to 70% stenosis with explain the level of troponin elevation LVP 163/7 mmHg with Mildly elevated LVEDP 19 to 20 mmHg. Right Heart Cath numbers relatively stable: RAP mean 4 mmHg, RVP-EDP 38/1-6 mmHg, PAP-mean 35/13-22 mmHg, PCWP mean 16 mmHg. Ao sat 95%, PA sat 69%; Cardiac Output-Index 8.25-4.42 (normal) Despite significant elevation of troponin, there is no obvious culprit lesion to explain the patient's symptoms. RECOMMENDATIONS Continue  guideline directed medical therapy for CAD Discontinued IV heparin Glenetta Hew, MD   Cardiac Studies   Cardiac cath 08/13/22   Mid LM to Ost LAD lesion is 95% stenosed with 90% stenosed side branch in Ost Cx.   Ost LAD to Prox LAD lesion is 100% stenosed.   Mid LAD lesion is 80% stenosed with 100% stenosed side branch in 2nd  Diag.   Ost Cx to Prox Cx lesion is 100% stenosed.   Dist Cx lesion is 70% stenosed.   Prox RCA lesion is 65% stenosed.   LIMA graft was visualized by angiography and is normal in caliber. The graft exhibits no disease. Widely patent   SVG-D1 graft was visualized by angiography.  The graft exhibits no disease.  Widely patent; diagonal does not retrograde fill the LAD   SVG-OM graft was visualized by angiography and is normal in caliber.  The graft exhibits no disease.Widely patent to a very trivial downstream vessel.   --------------------   LV end diastolic pressure is mildly elevated.   There is no aortic valve stenosis.   --------------------   POSTOP DIAGNOSES Severe disease of the native left coronary artery system with distal Left Main 90%, severe disease in the small AV groove Circumflex with occlusion of the proximal LAD and large dominant OM with minimal competitive flow. Small caliber heavily calcified Right Coronary Artery with proximal 60 to 70% stenosis with explain the level of troponin elevation LVP 163/7 mmHg with Mildly elevated LVEDP 19 to 20 mmHg. Right Heart Cath numbers relatively stable:  RAP mean 4 mmHg, RVP-EDP 38/1-6 mmHg, PAP-mean 35/13-22 mmHg, PCWP mean 16 mmHg. Ao sat 95%, PA sat 69%; Cardiac Output-Index 8.25-4.42 (normal)   Despite significant elevation of troponin, there is no obvious culprit lesion to explain the patient's symptoms.      RECOMMENDATIONS Continue guideline directed medical therapy for CAD Discontinued IV heparin     Antiplatelet/Anticoag Recommend Aspirin '81mg'$  daily for moderate CAD. With significant troponin elevation, could consider clopidogrel plus aspirin for 6 months to a year, defer to primary cardiology team.  Discharge Date Anticipated discharge date to be determined.     Glenetta Hew, MD    2D echo 08/08/2022:  1. Left ventricular ejection fraction, by estimation, is 65 to 70%. The  left ventricle has normal function. The  left ventricle has no regional  wall motion abnormalities. There is moderate asymmetric left ventricular  hypertrophy of the septal segment.  Left ventricular diastolic parameters are consistent with Grade I  diastolic dysfunction (impaired relaxation). Elevated left ventricular  end-diastolic pressure.   2. Right ventricular systolic function is normal. The right ventricular  size is normal. There is mildly elevated pulmonary artery systolic  pressure.   3. Left atrial size was mildly dilated.   4. The mitral valve is normal in structure. Trivial mitral valve  regurgitation. No evidence of mitral stenosis.   5. Tricuspid valve regurgitation is mild to moderate.   6. The aortic valve is tricuspid. Aortic valve regurgitation is not  visualized. No aortic stenosis is present.   7. Aortic dilatation noted. There is mild dilatation of the ascending  aorta, measuring 37 mm.   8. The inferior vena cava is dilated in size with <50% respiratory  variability, suggesting right atrial pressure of 15 mmHg.  Patient Profile     81 y.o. female with history of CAD s/p CABG in 2018, HFpEF, CVA in 01/2022, SVT, NSVT, PAD s/p PTA, DM, HTN, and HLD who was admitted  with acute hypoxic respiratory failure requiring mechanical ventilation with encephalopathy in the setting of DKA with metabolic derangement, undifferentiated shock and sepsis of unclear etiology and we are seeing for NSTEMI and new onset Afib.   Assessment & Plan    CAD s/p CABG NSTEMI - HS troponin elevated >24,000 in the setting of acute hypoxic respiratory failure requiring mechanical ventilation, AKI, anemia, SEPSIS - EKG with no ischemic changes - she was extubated 10/24. DKA resolved - R/L heart cath 10/27 showed severe disease of the native left artery system with distal Left main 90%, severe disease in the small AV groove Circumflex with occlusion of the pLAD and large dominant OM with minimal competitive flow. Small caliber heavily  calcified RCA with proximal 60-70% (suspected culprit of elevated troponin). LVEDP 19-75mHg. Right heart numbers stable. Medical therapy recommended  - IV heparin discontinued - continue Lipitor '80mg'$  daily, Plavix, and Eliquis - intermittent low BP - Echo showed normal LVEF - no further chest pain, SOB may be from hospitalization/general deconditioning   New onset Afib - remains in NSR - CHADSVASC of 9 - started on Eliquis '5mg'$  BID - Hgb low but stable   Acute hypoxic respiratory failure with encephalopathy - in the setting of DKA and metabolic derangement, shock and sepsis - extubated 10/24 - no longer requiring vasopressor   HLD - LDL 62 - continue Lipitor   AKI - likely ATN in the setting of hypotension requiring vasopressor - improved - nephrology signed off    For questions or updates, please contact CRoss CornerPlease consult [email or web address] for contact info under        Signed, Ambri Miltner HNinfa Meeker PA-C  08/14/2022, 8:23 AM

## 2022-08-14 NOTE — Progress Notes (Signed)
PROGRESS NOTE    Carrie Kane  ENI:[id number] DOB: 1940/11/05 DOA: 08/08/2022 PCP: Cletis Athens, MD    Brief Narrative:  This 81 years old female with PMH significant of left displaced femoral neck fracture, T2DM, CAD with Hx of CABG X 3, PAD s/p revascularization in 2022, PSVT, Anemia, Hypothyroidism, hypertension, HLD, CVA (02/2022) who presented to the ED with chief complaints of altered mental status and shortness of breath. She was noted to be hypotensive, have severe metabolic acidosis and hyperglycemia.  He was admitted in the ICU for DKA and managed as per protocol.  She was also found to have elevated troponin cardiology was consulted, recommended left heart cath once renal functions improved.  She is off pressor support.  CT shows small PE.  She was intubated and eventually successfully extubated. TRH pickup 08/11/2022.  Assessment & Plan:   Principal Problem:   Acute metabolic encephalopathy Active Problems:   Coronary artery disease involving native coronary artery of native heart with angina pectoris (HCC)   AKI (acute kidney injury) (HCC)   Altered mental status   Non-ST elevation (NSTEMI) myocardial infarction ([place])   Diabetic ketoacidosis without coma associated with type 2 diabetes mellitus ([place])   Sepsis with acute renal failure and septic shock (HCC)   PAF (paroxysmal atrial fibrillation) (HCC)   Dehydration  Acute metabolic encephalopathy likely multifactorial: > Resolved. Patient presented with DKA, shock and possible infection. Mental status has improved.  DKA resolved. she is back to baseline.  Acute hypoxic respiratory failure requiring mechanical ventilation: She is successfully extubated.   Continue supplemental oxygen and wean as tolerated.  NSTEMI: Patient had history of CAD with multiple PCI and CABG Cardiology is on the board , recommended to initiate heparin gtt. Patient denies any chest pain.  Continue aspirin,  Lipitor. Echocardiogram shows  LVEF 65 to 70%. She underwent left heart cath found to have severe disease but all of the grafts were open. Despite significant elevation of troponin there is no obvious culprit lesion to explain the patient's symptoms. Heparin discontinued.  Recommended Lipitor, Plavix and Eliquis. Patient reports chest pain has resolved.  Shortness of breath may be from deconditioning.   New onset A-fib: Heart rate is controlled, now in sinus rhythm. Continue Eliquis 5 mg twice daily.  Heparin discontinued.   Acute kidney injury in the setting of hypotension and DKA. Nephrology is following.  Serum creatinine improved, AKI resolved. Avoid nephrotoxic medications. No acute indication for dialysis at present. Nephrology signed off.  Diabetic ketoacidosis: Resolved.  Continue sliding scale.  Normochromic normocytic anemia without blood loss H&H remains stable.   Monitor H&H while on heparin.  Shock: Undifferentiated. > Resolved. Could be combination of sepsis, hypovolemia in the setting of DKA. She required pressor support. Levophed subsequently discontinued. CT abdomen pelvis shows pericholecystic fluid Ultrasound abdomen negative for acute cholecystitis. General surgery is consulted, if patient declines consider HIDA scan. Patient is improving with medical management. She completed the course of antibiotics.  General surgery signed off.   DVT prophylaxis: Eliquis Code Status: Full code Family Communication: No family at bedside. Disposition Plan:   Status is: Inpatient Remains inpatient appropriate because: Admitted for multiple active issues which are recovering. Underwent left heart cath which was significant for severe disease but all the grafts are open  PT and OT evaluation   Consultants:  Cardiology Nephrology PCCM  Procedures: None Antimicrobials: Anti-infectives (From admission, onward)    Start     Dose/Rate Route Frequency Ordered Stop   08/12/22 1000  ceFEPIme  (MAXIPIME) 2 g in sodium chloride 0.9 % 100 mL IVPB        2 g 200 mL/hr over 30 Minutes Intravenous Every 12 hours 08/12/22 0740 08/14/22 0148   08/08/22 2200  ceFEPIme (MAXIPIME) 2 g in sodium chloride 0.9 % 100 mL IVPB  Status:  Discontinued        2 g 200 mL/hr over 30 Minutes Intravenous Every 24 hours 08/08/22 1135 08/12/22 0740   08/08/22 1200  metroNIDAZOLE (FLAGYL) IVPB 500 mg        500 mg 100 mL/hr over 60 Minutes Intravenous Every 8 hours 08/08/22 1105 08/13/22 2359   08/08/22 0745  ceFEPIme (MAXIPIME) 2 g in sodium chloride 0.9 % 100 mL IVPB  Status:  Discontinued        2 g 200 mL/hr over 30 Minutes Intravenous  Once 08/08/22 0649 08/08/22 0816   08/08/22 0745  vancomycin (VANCOREADY) IVPB 1500 mg/300 mL  Status:  Discontinued        1,500 mg 150 mL/hr over 120 Minutes Intravenous  Once 08/08/22 0649 08/08/22 0816   08/08/22 0300  ceFEPIme (MAXIPIME) 2 g in sodium chloride 0.9 % 100 mL IVPB        2 g 200 mL/hr over 30 Minutes Intravenous  Once 08/08/22 0249 08/08/22 0326   08/08/22 0300  vancomycin (VANCOREADY) IVPB 1500 mg/300 mL        1,500 mg 150 mL/hr over 120 Minutes Intravenous  Once 08/08/22 0249 08/08/22 0929       Subjective: Patient was seen and examined at bedside. Overnight events noted. Patient reports feeling much better. She is back to baseline mental status. She denies any chest pain but reports having shortness of breath which could be from deconditioning.  Objective: Vitals:   08/13/22 2323 08/14/22 0311 08/14/22 0446 08/14/22 0749  BP: 118/67 (!) 117/58  (!) 105/57  Pulse: 97 (!) 104  93  Resp:  18  18  Temp:  97.9 F (36.6 C)  97.7 F (36.5 C)  TempSrc:    Oral  SpO2: 99% 92%  98%  Weight:   75 kg   Height:        Intake/Output Summary (Last 24 hours) at 08/14/2022 1037 Last data filed at 08/14/2022 0749 Gross per 24 hour  Intake --  Output 900 ml  Net -900 ml   Filed Weights   08/11/22 0431 08/12/22 0500 08/14/22 0446  Weight:  71.9 kg 73.1 kg 75 kg    Examination:  General exam: He is comfortable, not in any acute distress, deconditioned. Respiratory system: CTA bilaterally, respiratory effort normal, RR 15 Cardiovascular system: S1-S2 heard, regular rate and rhythm, no murmur. Gastrointestinal system: Abdomen is soft, non tender, non distended, BS+ Central nervous system: Alert and oriented x3, no focal neurological deficits. Extremities: No edema, no cyanosis, no clubbing Skin: No rashes, lesions or ulcers Psychiatry: Judgement and insight appear normal. Mood & affect appropriate.     Data Reviewed: I have personally reviewed following labs and imaging studies  CBC: Recent Labs  Lab 08/08/22 0210 08/08/22 0339 08/09/22 0526 08/10/22 0420 08/11/22 0420 08/12/22 0533 08/13/22 1528 08/13/22 1536 08/14/22 0639  WBC 19.0* 21.1* 19.2* 16.8* 8.8 7.8  --   --  7.0  NEUTROABS 14.1* 16.0*  --   --   --   --   --   --   --   HGB 9.8* 8.9* 9.6* 10.0* 9.1* 8.7* 8.5* 8.5* 9.8*  HCT 34.8* 30.2*  28.6* 30.6* 28.2* 26.3* 25.0* 25.0* 30.5*  MCV 105.8* 99.3 89.9 90.5 92.2 92.3  --   --  90.8  PLT 214 195 188 173 131* 137*  --   --  563   Basic Metabolic Panel: Recent Labs  Lab 08/10/22 0420 08/11/22 0420 08/12/22 0533 08/13/22 0500 08/13/22 1528 08/13/22 1536 08/14/22 0639  NA 139 142 143 140 139 139 139  K 4.2 3.5 3.4* 3.3* 4.1 4.1 5.2*  CL 100 102 106 104  --   --  104  CO2 31 32 28 30  --   --  18*  GLUCOSE 142* 104* 103* 211*  --   --  371*  BUN 41* 37* 29* 23  --   --  24*  CREATININE 2.33* 1.56* 1.03* 0.84  --   --  0.99  CALCIUM 8.0* 8.5* 8.3* 8.0*  --   --  8.3*  MG 1.7 1.9 1.9 1.9  --   --  2.3  PHOS 3.3 2.9 2.2* 1.8*  --   --  3.9   GFR: Estimated Creatinine Clearance: 46.5 mL/min (by C-G formula based on SCr of 0.99 mg/dL). Liver Function Tests: Recent Labs  Lab 08/08/22 0210 08/09/22 0747 08/10/22 0420 08/11/22 0420 08/12/22 0533  AST 105* 267* 169* 124* 81*  ALT 38 92* 81*  68* 55*  ALKPHOS 88 70 80 81 79  BILITOT 1.8* 0.5 0.5 0.8 1.1  PROT 6.6 5.0* 5.2* 4.9* 4.8*  ALBUMIN 3.9 2.7* 2.6* 2.4* 2.4*   No results for input(s): "LIPASE", "AMYLASE" in the last 168 hours. No results for input(s): "AMMONIA" in the last 168 hours. Coagulation Profile: Recent Labs  Lab 08/08/22 0210 08/09/22 0745  INR 1.2 1.2   Cardiac Enzymes: No results for input(s): "CKTOTAL", "CKMB", "CKMBINDEX", "TROPONINI" in the last 168 hours. BNP (last 3 results) No results for input(s): "PROBNP" in the last 8760 hours. HbA1C: No results for input(s): "HGBA1C" in the last 72 hours. CBG: Recent Labs  Lab 08/13/22 1409 08/13/22 1855 08/13/22 2333 08/14/22 0308 08/14/22 0743  GLUCAP 127* 159* 280* 310* 317*   Lipid Profile: No results for input(s): "CHOL", "HDL", "LDLCALC", "TRIG", "CHOLHDL", "LDLDIRECT" in the last 72 hours.  Thyroid Function Tests: No results for input(s): "TSH", "T4TOTAL", "FREET4", "T3FREE", "THYROIDAB" in the last 72 hours. Anemia Panel: No results for input(s): "VITAMINB12", "FOLATE", "FERRITIN", "TIBC", "IRON", "RETICCTPCT" in the last 72 hours. Sepsis Labs: Recent Labs  Lab 08/08/22 0210 08/08/22 1025 08/09/22 0526 08/09/22 0745 08/10/22 0420  PROCALCITON 0.99  --  7.02  --  5.52  LATICACIDVEN >9.0* >9.0*  --  2.8*  --     Recent Results (from the past 240 hour(s))  Blood Culture (routine x 2)     Status: None   Collection Time: 08/08/22  2:10 AM   Specimen: BLOOD RIGHT ARM  Result Value Ref Range Status   Specimen Description BLOOD RIGHT ARM  Final   Special Requests   Final    BOTTLES DRAWN AEROBIC AND ANAEROBIC Blood Culture adequate volume   Culture   Final    NO GROWTH 5 DAYS Performed at [facility], [address], [place], [place] [address]    Report Status 08/13/2022 FINAL  Final  Blood Culture (routine x 2)     Status: None   Collection Time: 08/08/22  2:10 AM   Specimen: BLOOD  Result Value Ref Range Status    Specimen Description BLOOD BLOOD LEFT ARM  Final   Special  Requests   Final    BOTTLES DRAWN AEROBIC AND ANAEROBIC Blood Culture adequate volume   Culture   Final    NO GROWTH 5 DAYS Performed at [facility], [address] [place], [place] [address]    Report Status 08/13/2022 FINAL  Final  Urine Culture     Status: None   Collection Time: 08/08/22  2:10 AM   Specimen: In/Out Cath Urine  Result Value Ref Range Status   Specimen Description   Final    IN/OUT CATH URINE Performed at [facility], [address], [place], [place] [address]    Special Requests   Final    NONE Performed at [facility], [address] [place], [place] [address]    Culture   Final    NO GROWTH Performed at [facility], [address], [place], [place] [address]    Report Status 08/09/2022 FINAL  Final  SARS Coronavirus 2 by RT PCR (hospital order, performed in [facility] hospital lab) *cepheid single result test* Anterior Nasal Swab     Status: None   Collection Time: 08/08/22  5:10 AM   Specimen: Anterior Nasal Swab  Result Value Ref Range Status   SARS Coronavirus 2 by RT PCR NEGATIVE NEGATIVE Final    Comment: (NOTE) SARS-CoV-2 target nucleic acids are NOT DETECTED.  The SARS-CoV-2 RNA is generally detectable in upper and lower respiratory specimens during the acute phase of infection. The lowest concentration of SARS-CoV-2 viral copies this assay can detect is 250 copies / mL. A negative result does not preclude SARS-CoV-2 infection and should not be used as the sole basis for treatment or other patient management decisions.  A negative result may occur with improper specimen collection / handling, submission of specimen other than nasopharyngeal swab, presence of viral mutation(s) within the areas targeted by this assay, and inadequate number of viral copies (<250 copies / mL). A negative result must be combined with clinical observations, patient  history, and epidemiological information.  Fact Sheet for Patients:   https://www.patel.info/  Fact Sheet for Healthcare Providers: https://hall.com/  This test is not yet approved or  cleared by the [place] FDA and has been authorized for detection and/or diagnosis of SARS-CoV-2 by FDA under an Emergency Use Authorization (EUA).  This EUA will remain in effect (meaning this test can be used) for the duration of the COVID-19 declaration under Section 564(b)(1) of the Act, 21 U.S.C. section 360bbb-3(b)(1), unless the authorization is terminated or revoked sooner.  Performed at [facility], [address] [place], [place] [address]   MRSA Next Gen by PCR, Nasal     Status: None   Collection Time: 08/08/22  5:10 AM   Specimen: Anterior Nasal Swab  Result Value Ref Range Status   MRSA by PCR Next Gen NOT DETECTED NOT DETECTED Final    Comment: (NOTE) The GeneXpert MRSA Assay (FDA approved for NASAL specimens only), is one component of a comprehensive MRSA colonization surveillance program. It is not intended to diagnose MRSA infection nor to guide or monitor treatment for MRSA infections. Test performance is not FDA approved in patients less than 11 years old. Performed at [facility], [address] [place],  [address]   Culture, Respiratory w Gram Stain     Status: None   Collection Time: 08/09/22  7:54 AM   Specimen: Tracheal Aspirate; Respiratory  Result Value Ref Range Status   Specimen Description   Final  TRACHEAL ASPIRATE Performed at [facility], [address] [place], [place] [address]    Special Requests   Final    NONE Performed at [facility], [place], [place] [address]    Gram Stain   Final    FEW WBC PRESENT,BOTH PMN AND MONONUCLEAR RARE GRAM POSITIVE COCCI IN CLUSTERS RARE BUDDING YEAST SEEN Performed at [facility], [place]  [address] [place], [place] [address]    Culture FEW CANDIDA TROPICALIS  Final   Report Status 08/11/2022 FINAL  Final  Respiratory (~20 pathogens) panel by PCR     Status: None   Collection Time: 08/09/22  7:54 AM   Specimen: Nasopharyngeal Swab; Respiratory  Result Value Ref Range Status   Adenovirus NOT DETECTED NOT DETECTED Final   Coronavirus 229E NOT DETECTED NOT DETECTED Final    Comment: (NOTE) The Coronavirus on the Respiratory Panel, DOES NOT test for the novel  Coronavirus (2019 nCoV)    Coronavirus HKU1 NOT DETECTED NOT DETECTED Final   Coronavirus NL63 NOT DETECTED NOT DETECTED Final   Coronavirus OC43 NOT DETECTED NOT DETECTED Final   Metapneumovirus NOT DETECTED NOT DETECTED Final   Rhinovirus / Enterovirus NOT DETECTED NOT DETECTED Final   Influenza A NOT DETECTED NOT DETECTED Final   Influenza B NOT DETECTED NOT DETECTED Final   Parainfluenza Virus 1 NOT DETECTED NOT DETECTED Final   Parainfluenza Virus 2 NOT DETECTED NOT DETECTED Final   Parainfluenza Virus 3 NOT DETECTED NOT DETECTED Final   Parainfluenza Virus 4 NOT DETECTED NOT DETECTED Final   Respiratory Syncytial Virus NOT DETECTED NOT DETECTED Final   Bordetella pertussis NOT DETECTED NOT DETECTED Final   Bordetella Parapertussis NOT DETECTED NOT DETECTED Final   Chlamydophila pneumoniae NOT DETECTED NOT DETECTED Final   Mycoplasma pneumoniae NOT DETECTED NOT DETECTED Final    Comment: Performed at [facility], [place] [place], [place] [address]    Radiology Studies: CARDIAC CATHETERIZATION  Result Date: 08/13/2022   Mid LM to [facility] LAD lesion is 95% stenosed with 90% stenosed side branch in Ost Cx.   Ost LAD to Prox LAD lesion is 100% stenosed.   Mid LAD lesion is 80% stenosed with 100% stenosed side branch in 2nd Diag.   Ost Cx to Prox Cx lesion is 100% stenosed.   Dist Cx lesion is 70% stenosed.   Prox RCA lesion is 65% stenosed.   LIMA graft was visualized by angiography and is normal in caliber.  The graft exhibits no disease. Widely patent   SVG-D1 graft was visualized by angiography.  The graft exhibits no disease.  Widely patent; diagonal does not retrograde fill the LAD   SVG-OM graft was visualized by angiography and is normal in caliber.  The graft exhibits no disease.Widely patent to a very trivial downstream vessel.   --------------------   LV end diastolic pressure is mildly elevated.   There is no aortic valve stenosis.   -------------------- POSTOP DIAGNOSES Severe disease of the native left coronary artery system with distal Left Main 90%, severe disease in the small AV groove Circumflex with occlusion of the proximal LAD and large dominant OM with minimal competitive flow. Small caliber heavily calcified Right Coronary Artery with proximal 60 to 70% stenosis with explain the level of troponin elevation LVP 163/7 mmHg with Mildly elevated LVEDP 19 to 20 mmHg. Right Heart Cath numbers relatively stable: RAP mean 4 mmHg, RVP-EDP 38/1-6 mmHg, PAP-mean 35/13-22 mmHg, PCWP mean 16  mmHg. Ao sat 95%, PA sat 69%; Cardiac Output-Index 8.25-4.42 (normal) Despite significant elevation of troponin, there is no obvious culprit lesion to explain the patient's symptoms. RECOMMENDATIONS Continue guideline directed medical therapy for CAD Discontinued IV heparin Glenetta Hew, MD   Scheduled Meds:  apixaban  5 mg Oral BID   atorvastatin  80 mg Oral Daily   Chlorhexidine Gluconate Cloth  6 each Topical Daily   clopidogrel  75 mg Oral Daily   dextrose  1 ampule Intravenous Once   feeding supplement  237 mL Oral TID BM   insulin aspart  0-9 Units Subcutaneous Q4H   insulin glargine-yfgn  8 Units Subcutaneous Daily   multivitamin with minerals  1 tablet Oral Daily   mouth rinse  15 mL Mouth Rinse Q6H   pantoprazole (PROTONIX) IV  40 mg Intravenous QHS   senna-docusate  1 tablet Oral BID   sodium chloride flush  3 mL Intravenous Q12H   sodium chloride flush  3 mL Intravenous Q12H   Continuous  Infusions:  sodium chloride 250 mL (08/14/22 0114)     LOS: 6 days    Time spent: 35 mins    Elida Harbin, MD [facility]   If 7PM-7AM, please contact night-coverage

## 2022-08-14 NOTE — Progress Notes (Signed)
Physical Therapy Co-Treatment Patient Details Name: Carrie Kane MRN: [id number] DOB: 07/15/1941 Today's Date: 08/14/2022   History of Present Illness 81 y.o female with significant PMH of Left displaced femoral neck fracture, T2DM, CAD with Hx of CABG X 3, PAD s/p revascularization 2022, PSVT, Anemia, Hypothyroidism,hypertension, HLD, CVA (02/2022) who presented to the ED with chief complaints of altered mental status and shortness of breath. On Presentation, she was noted to be hypotensive, have severe metabolic acidosis and hyperglycemia, with a positive beta-hydroxybutarate.  Pt intubated from 08/08/2022 thru 08/10/2022. Pt s/p left heart cath on 08/13/22 (right femoral access)    PT Comments    [name] is s/p L cardiac cath on 08/13/22. She denies any pain today. She did express desire to eat. Shortly after PT started, her breakfast tray was delivered. Therefore, exercises were limited and we focused session on transferring to recliner to better position for eating. Co-treatment performed for safety as patient is +2 for transfers. She does exhibit some improvement this session compared to earlier in the week. She was supervision for bed mobility but does require increased time/effort and bed rail. She required mod A +2 for sit<>stand transfer. Pt was able to take a few steps to bedside chair with min A and RW. She does exhibit unsteady gait pattern. She also had a hard time controlling descent to chair requiring mod A +2 with cues for hand placement. Patient was limited today with fatigue and weakness. Current plan remains appropriate. Patient would benefit from additional skilled PT intervention to improve strength, balance and gait safety;   Recommendations for follow up therapy are one component of a multi-disciplinary discharge planning process, led by the attending physician.  Recommendations may be updated based on patient status, additional functional criteria and insurance  authorization.  Follow Up Recommendations  Skilled nursing-short term rehab (<3 hours/day) Can patient physically be transported by private vehicle: No   Assistance Recommended at Discharge Frequent or constant Supervision/Assistance  Patient can return home with the following Two people to help with walking and/or transfers;A lot of help with bathing/dressing/bathroom;Assistance with cooking/housework;Assist for transportation;Help with stairs or ramp for entrance   Equipment Recommendations       Recommendations for Other Services       Precautions / Restrictions Precautions Precautions: Fall Restrictions Weight Bearing Restrictions: No     Mobility  Bed Mobility Overal bed mobility: Needs Assistance Bed Mobility: Supine to Sit     Supine to sit: Supervision, HOB elevated     General bed mobility comments: Requires increased time/effort; uses bed rails; requires set up from PT for catheter bag/lowering bed rail;    Transfers Overall transfer level: Needs assistance Equipment used: Rolling walker (2 wheels) Transfers: Sit to/from Stand Sit to Stand: Mod assist, +2 physical assistance           General transfer comment: Pt transferred sit<>stand from bed with RW, requires +2 physical assistance. Pt required cues for hand placement, however continued to pull on RW for transfer. She exhibit uncontrolled sit down to recliner likely due to weakness in LE and fatigue;    Ambulation/Gait Ambulation/Gait assistance: Min assist Gait Distance (Feet): 3 Feet Assistive device: Rolling walker (2 wheels)   Gait velocity: decreased     General Gait Details: Pt ambulated 3-5 steps from bed to bedside chair. She required min A for safety with RW, exhibiting unsteady gait pattern, requiring increased time/effort.   Stairs  Wheelchair Mobility    Modified Rankin (Stroke Patients Only)       Balance Overall balance assessment: Needs  assistance Sitting-balance support: Feet supported, Bilateral upper extremity supported Sitting balance-Leahy Scale: Fair     Standing balance support: Bilateral upper extremity supported Standing balance-Leahy Scale: Fair Standing balance comment: highly reliant on the walker, able to maintain standing with knees locked, but UE reliant during any weight shifting/stepping                            Cognition Arousal/Alertness: Awake/alert Behavior During Therapy: Flat affect, WFL for tasks assessed/performed Overall Cognitive Status: Within Functional Limits for tasks assessed                                          Exercises Other Exercises Other Exercises: Instructed patient in BLE ankle pump while PT got room ready x10-15 reps with cues to increase ROM for increased flexibility/stretch;    General Comments General comments (skin integrity, edema, etc.): No swelling noted; MD came in during session and inspected incision site with no bleeding/bruising reported;      Pertinent Vitals/Pain Pain Assessment Pain Assessment: No/denies pain    Home Living                          Prior Function            PT Goals (current goals can now be found in the care plan section) Acute Rehab PT Goals Patient Stated Goal: get stronger at rehab PT Goal Formulation: With patient Time For Goal Achievement: 08/24/22 Potential to Achieve Goals: Fair Progress towards PT goals: Progressing toward goals    Frequency    Min 2X/week      PT Plan Current plan remains appropriate    Co-evaluation PT/OT/SLP Co-Evaluation/Treatment: Yes Reason for Co-Treatment: To address functional/ADL transfers;For patient/therapist safety PT goals addressed during session: Mobility/safety with mobility;Strengthening/ROM;Proper use of DME OT goals addressed during session: ADL's and self-care;Proper use of Adaptive equipment and DME      AM-PAC PT "6 Clicks"  Mobility   Outcome Measure  Help needed turning from your back to your side while in a flat bed without using bedrails?: A Lot Help needed moving from lying on your back to sitting on the side of a flat bed without using bedrails?: A Lot Help needed moving to and from a bed to a chair (including a wheelchair)?: A Lot Help needed standing up from a chair using your arms (e.g., wheelchair or bedside chair)?: A Lot Help needed to walk in hospital room?: Total Help needed climbing 3-5 steps with a railing? : Total 6 Click Score: 10    End of Session Equipment Utilized During Treatment: Gait belt Activity Tolerance: Patient tolerated treatment well;Patient limited by fatigue Patient left: in chair;with call bell/phone within reach;with chair alarm set   PT Visit Diagnosis: Muscle weakness (generalized) (M62.81);Difficulty in walking, not elsewhere classified (R26.2);Unsteadiness on feet (R26.81)     Time: 2878-6767 PT Time Calculation (min) (ACUTE ONLY): 26 min  Charges:  $Therapeutic Activity: 8-22 mins                      Joliene Salvador PT, DPT 08/14/2022, 11:04 AM

## 2022-08-14 NOTE — TOC Progression Note (Signed)
Transition of Care [facility]) - Progression Note    Patient Details  Name: Carrie Kane MRN: [id number] Date of Birth: [birth year]  Transition of Care [facility] CM/SW [place] Phone Number: 08/14/2022, 11:10 AM  Clinical Narrative:     Per MD patient medically stable to dc, informed that [facility] does not do weekend admissions.   CSW has started insurance authorization with Everlene Balls today and notified leslie at [facility] plans to dc to facility on Monday once auth achieved.        Expected Discharge Plan and Services                                                 Social Determinants of Health (SDOH) Interventions    Readmission Risk Interventions    08/13/2022   12:08 PM  Readmission Risk Prevention Plan  Transportation Screening Complete  HRI or Home Care Consult Complete  Palliative Care Screening Not Applicable  Medication Review (RN Care Manager) Complete

## 2022-08-14 NOTE — Progress Notes (Signed)
Occupational Therapy Treatment Patient Details Name: Carrie Kane MRN: [id number] DOB: 02-22-1941 Today's Date: 08/14/2022   History of present illness 81 y.o female with significant PMH of Left displaced femoral neck fracture, T2DM, CAD with Hx of CABG X 3, PAD s/p revascularization 2022, PSVT, Anemia, Hypothyroidism,hypertension, HLD, CVA (02/2022) who presented to the ED with chief complaints of altered mental status and shortness of breath. On Presentation, she was noted to be hypotensive, have severe metabolic acidosis and hyperglycemia, with a positive beta-hydroxybutarate.  Pt intubated from 08/08/2022 thru 08/10/2022. Pt s/p left heart cath on 08/13/22 (right femoral access)   OT comments  Carrie Kane was seen for OT/PT co-tx this date. Pt now s/p Heart cath and reports feeling generally weak with minimal pain noted this date. She is agreeable to OT/PT co-tx and eager to sit at EOB. Pt motivated to participate t/o session, however, remains functionally limited by generalized weakness and decreased activity tolerance. She requires MOD A for bed mobility and MOD A +2 for step pivot transfer to room recliner. Pt making good progress toward goals. Pt continues to benefit from skilled OT services to maximize return to PLOF and minimize risk of future falls, injury, caregiver burden, and readmission. Will continue to follow POC. Discharge recommendation remains appropriate.     Recommendations for follow up therapy are one component of a multi-disciplinary discharge planning process, led by the attending physician.  Recommendations may be updated based on patient status, additional functional criteria and insurance authorization.    Follow Up Recommendations  Skilled nursing-short term rehab (<3 hours/day)    Assistance Recommended at Discharge Frequent or constant Supervision/Assistance  Patient can return home with the following  Two people to help with walking and/or transfers;A lot of help  with bathing/dressing/bathroom;Assist for transportation;Help with stairs or ramp for entrance;Assistance with cooking/housework   Equipment Recommendations   (defer to next venue of care.)    Recommendations for Other Services      Precautions / Restrictions Precautions Precautions: Fall Restrictions Weight Bearing Restrictions: No       Mobility Bed Mobility Overal bed mobility: Needs Assistance Bed Mobility: Supine to Sit     Supine to sit: Supervision, HOB elevated Sit to supine: HOB elevated, Mod assist   General bed mobility comments: Requires increased time/effort    Transfers Overall transfer level: Needs assistance Equipment used: Rolling walker (2 wheels) Transfers: Sit to/from Stand Sit to Stand: Mod assist, +2 physical assistance           General transfer comment: Pt transferred sit<>stand from bed with RW, requires +2 physical assistance. Pt required cues for hand placement, however continued to pull on RW for transfer. She exhibit uncontrolled sit down to recliner likely due to weakness in LE and fatigue;     Balance Overall balance assessment: Needs assistance Sitting-balance support: Feet supported, Bilateral upper extremity supported Sitting balance-Leahy Scale: Fair Sitting balance - Comments: No physical assist required for sitting EOB. maintains BUE support on EOB during seated activity.   Standing balance support: Bilateral upper extremity supported   Standing balance comment: highly reliant on the walker, able to maintain standing with knees locked, but UE reliant during any weight shifting/stepping                           ADL either performed or assessed with clinical judgement   ADL Overall ADL's : Needs assistance/impaired  General ADL Comments: MOD A +2 for functional transfer to chair with assist for mgt of lines/leads. Pt fatigues quickly at EOB. Anticipate MOD A for  LB ADL management from STS. SET UP assist with increased time to perform seated UB ADL management.    Extremity/Trunk Assessment Upper Extremity Assessment Upper Extremity Assessment: Generalized weakness   Lower Extremity Assessment Lower Extremity Assessment: Generalized weakness   Cervical / Trunk Assessment Cervical / Trunk Assessment: Kyphotic    Vision Patient Visual Report: No change from baseline     Perception     Praxis      Cognition Arousal/Alertness: Awake/alert Behavior During Therapy: Flat affect, WFL for tasks assessed/performed Overall Cognitive Status: Within Functional Limits for tasks assessed                                 General Comments: A&O to self, place, and situation. Able to follow VCs consistently t/o session.        Exercises Other Exercises Other Exercises: Pt educated on safe use of AE/DME for ADL management, routines modifications to support safety and functional independence upon hospital DC, and safe transfer techniques.    Shoulder Instructions       General Comments No swelling noted; MD came in during session and inspected incision site with no bleeding/bruising reported;    Pertinent Vitals/ Pain       Pain Assessment Pain Assessment: No/denies pain  Home Living                                          Prior Functioning/Environment              Frequency  Min 2X/week        Progress Toward Goals  OT Goals(current goals can now be found in the care plan section)  Progress towards OT goals: Progressing toward goals  Acute Rehab OT Goals Patient Stated Goal: to go home OT Goal Formulation: With patient Time For Goal Achievement: 08/25/22 Potential to Achieve Goals: Good  Plan Discharge plan remains appropriate;Frequency remains appropriate    Co-evaluation    PT/OT/SLP Co-Evaluation/Treatment: Yes Reason for Co-Treatment: To address functional/ADL transfers;For  patient/therapist safety PT goals addressed during session: Mobility/safety with mobility;Balance;Proper use of DME OT goals addressed during session: ADL's and self-care;Proper use of Adaptive equipment and DME      AM-PAC OT "6 Clicks" Daily Activity     Outcome Measure   Help from another person eating meals?: A Little Help from another person taking care of personal grooming?: A Lot Help from another person toileting, which includes using toliet, bedpan, or urinal?: A Lot Help from another person bathing (including washing, rinsing, drying)?: A Lot Help from another person to put on and taking off regular upper body clothing?: A Little Help from another person to put on and taking off regular lower body clothing?: A Lot 6 Click Score: 14    End of Session Equipment Utilized During Treatment: Gait belt;Rolling walker (2 wheels)  OT Visit Diagnosis: Other abnormalities of gait and mobility (R26.89);Muscle weakness (generalized) (M62.81)   Activity Tolerance Patient tolerated treatment well   Patient Left in chair;with call bell/phone within reach;with chair alarm set   Nurse Communication Mobility status        Time: 2703-5009 OT Time Calculation (min): 19 min  Charges: OT General Charges $OT Visit: 1 Visit OT Treatments $Self Care/Home Management : 8-22 mins  Shara Blazing, M.S., OTR/L 08/14/22, 12:25 PM

## 2022-08-15 DIAGNOSIS — G9341 Metabolic encephalopathy: Secondary | ICD-10-CM | POA: Diagnosis not present

## 2022-08-15 LAB — GLUCOSE, CAPILLARY
Glucose-Capillary: 157 mg/dL — ABNORMAL HIGH (ref 70–99)
Glucose-Capillary: 146 mg/dL — ABNORMAL HIGH (ref 70–99)
Glucose-Capillary: 204 mg/dL — ABNORMAL HIGH (ref 70–99)
Glucose-Capillary: 321 mg/dL — ABNORMAL HIGH (ref 70–99)
Glucose-Capillary: 312 mg/dL — ABNORMAL HIGH (ref 70–99)
Glucose-Capillary: 158 mg/dL — ABNORMAL HIGH (ref 70–99)

## 2022-08-15 LAB — BASIC METABOLIC PANEL
Anion gap: 5 (ref 5–15)
BUN: 24 mg/dL — ABNORMAL HIGH (ref 8–23)
CO2: 29 mmol/L (ref 22–32)
Calcium: 8.1 mg/dL — ABNORMAL LOW (ref 8.9–10.3)
Chloride: 102 mmol/L (ref 98–111)
Creatinine, Ser: 0.97 mg/dL (ref 0.44–1.00)
GFR, Estimated: 59 mL/min — ABNORMAL LOW (ref >60)
Glucose, Bld: 177 mg/dL — ABNORMAL HIGH (ref 70–99)
Potassium: 3.9 mmol/L (ref 3.5–5.1)
Sodium: 136 mmol/L (ref 135–145)

## 2022-08-15 LAB — PHOSPHORUS: Phosphorus: 1.5 mg/dL — ABNORMAL LOW (ref 2.5–4.6)

## 2022-08-15 LAB — MAGNESIUM: Magnesium: 2 mg/dL (ref 1.7–2.4)

## 2022-08-15 MED ORDER — METOPROLOL TARTRATE 25 MG PO TABS
12.5000 mg | ORAL_TABLET | Freq: Two times a day (BID) | ORAL | Status: DC
  Administered 2022-08-15 – 2022-08-16 (×3): 12.5 mg via ORAL
  Filled 2022-08-15 (×3): qty 1

## 2022-08-15 MED ORDER — K PHOS MONO-SOD PHOS DI & MONO 155-852-130 MG PO TABS
250.0000 mg | ORAL_TABLET | Freq: Three times a day (TID) | ORAL | Status: DC
  Administered 2022-08-15 – 2022-08-16 (×5): 250 mg via ORAL
  Filled 2022-08-15 (×6): qty 1

## 2022-08-15 NOTE — Progress Notes (Signed)
Cardiology Progress Note  Patient ID: Carrie Kane MRN: [id number] DOB: [birth year] Date of Encounter: 08/15/2022  Primary Cardiologist: Ida Rogue, MD  Subjective   Chief Complaint: None.   HPI: Chest pain improved with Imdur.  No further episodes.  Denies any symptoms today.  EKG was sinus rhythm with PACs.  ROS:  All other ROS reviewed and negative. Pertinent positives noted in the HPI.     Inpatient Medications  Scheduled Meds:  apixaban  5 mg Oral BID   atorvastatin  80 mg Oral Daily   Chlorhexidine Gluconate Cloth  6 each Topical Daily   clopidogrel  75 mg Oral Daily   feeding supplement  237 mL Oral TID BM   insulin aspart  0-9 Units Subcutaneous Q4H   insulin glargine-yfgn  8 Units Subcutaneous Daily   isosorbide mononitrate  15 mg Oral Daily   multivitamin with minerals  1 tablet Oral Daily   mouth rinse  15 mL Mouth Rinse Q6H   pantoprazole (PROTONIX) IV  40 mg Intravenous QHS   phosphorus  250 mg Oral TID   senna-docusate  1 tablet Oral BID   sodium chloride flush  3 mL Intravenous Q12H   sodium chloride flush  3 mL Intravenous Q12H   Continuous Infusions:  sodium chloride 250 mL (08/14/22 0114)   PRN Meds: sodium chloride, acetaminophen, alum & mag hydroxide-simeth, dextrose, ondansetron (ZOFRAN) IV, mouth rinse, polyethylene glycol, sodium chloride flush   Vital Signs   Vitals:   08/14/22 1155 08/14/22 1653 08/15/22 0804 08/15/22 1117  BP: 110/64 (!) 107/46 112/67 (!) 103/49  Pulse: 94 99 87 91  Resp: '17  17 17  '$ Temp: (!) 97.5 F (36.4 C)  97.8 F (36.6 C) 98.5 F (36.9 C)  TempSrc:      SpO2: 96% 96% 100% 100%  Weight:      Height:        Intake/Output Summary (Last 24 hours) at 08/15/2022 1125 Last data filed at 08/15/2022 1119 Gross per 24 hour  Intake 240 ml  Output 450 ml  Net -210 ml      08/14/2022    4:46 AM 08/12/2022    5:00 AM 08/11/2022    4:31 AM  Last 3 Weights  Weight (lbs) 165 lb 5.5 oz 161 lb 2.5 oz 158 lb 8.2  oz  Weight (kg) 75 kg 73.1 kg 71.9 kg      Telemetry  Overnight telemetry shows SR with PACs, which I personally reviewed.   ECG  The most recent ECG shows SR 78, RBBB/LAFB, which I personally reviewed.   Physical Exam   Vitals:   08/14/22 1155 08/14/22 1653 08/15/22 0804 08/15/22 1117  BP: 110/64 (!) 107/46 112/67 (!) 103/49  Pulse: 94 99 87 91  Resp: '17  17 17  '$ Temp: (!) 97.5 F (36.4 C)  97.8 F (36.6 C) 98.5 F (36.9 C)  TempSrc:      SpO2: 96% 96% 100% 100%  Weight:      Height:        Intake/Output Summary (Last 24 hours) at 08/15/2022 1125 Last data filed at 08/15/2022 1119 Gross per 24 hour  Intake 240 ml  Output 450 ml  Net -210 ml       08/14/2022    4:46 AM 08/12/2022    5:00 AM 08/11/2022    4:31 AM  Last 3 Weights  Weight (lbs) 165 lb 5.5 oz 161 lb 2.5 oz 158 lb 8.2 oz  Weight (kg) 75 kg 73.1  kg 71.9 kg    Body mass index is 25.14 kg/m.  General: Well nourished, well developed, in no acute distress Head: Atraumatic, normal size  Eyes: PEERLA, EOMI  Neck: Supple, no JVD Endocrine: No thryomegaly Cardiac: Normal S1, S2; RRR; no murmurs, rubs, or gallops Lungs: Clear to auscultation bilaterally, no wheezing, rhonchi or rales  Abd: Soft, nontender, no hepatomegaly  Ext: No edema, pulses 2+ Musculoskeletal: No deformities, BUE and BLE strength normal and equal Skin: Warm and dry, no rashes   Neuro: Alert and oriented to person, place, time, and situation, CNII-XII grossly intact, no focal deficits  Psych: Normal mood and affect   Labs  High Sensitivity Troponin:   Recent Labs  Lab 08/08/22 0210 08/08/22 0513 08/09/22 0745 08/09/22 1048 08/10/22 0639  TROPONINIHS 3,038* 15,838* >24,000* >24,000* >24,000*     Cardiac EnzymesNo results for input(s): "TROPONINI" in the last 168 hours. No results for input(s): "TROPIPOC" in the last 168 hours.  Chemistry Recent Labs  Lab 08/10/22 0420 08/11/22 0420 08/12/22 0533 08/13/22 0500  08/13/22 1528 08/13/22 1536 08/14/22 0639 08/14/22 1350 08/15/22 0500  NA 139 142 143 140   < > 139 139  --  136  K 4.2 3.5 3.4* 3.3*   < > 4.1 5.2* 4.8 3.9  CL 100 102 106 104  --   --  104  --  102  CO2 31 32 28 30  --   --  18*  --  29  GLUCOSE 142* 104* 103* 211*  --   --  371*  --  177*  BUN 41* 37* 29* 23  --   --  24*  --  24*  CREATININE 2.33* 1.56* 1.03* 0.84  --   --  0.99  --  0.97  CALCIUM 8.0* 8.5* 8.3* 8.0*  --   --  8.3*  --  8.1*  PROT 5.2* 4.9* 4.8*  --   --   --   --   --   --   ALBUMIN 2.6* 2.4* 2.4*  --   --   --   --   --   --   AST 169* 124* 81*  --   --   --   --   --   --   ALT 81* 68* 55*  --   --   --   --   --   --   ALKPHOS 80 81 79  --   --   --   --   --   --   BILITOT 0.5 0.8 1.1  --   --   --   --   --   --   GFRNONAA 21* 34* 55* >60  --   --  58*  --  59*  ANIONGAP '8 8 9 6  '$ --   --  17*  --  5   < > = values in this interval not displayed.    Hematology Recent Labs  Lab 08/11/22 0420 08/12/22 0533 08/13/22 1528 08/13/22 1536 08/14/22 0639  WBC 8.8 7.8  --   --  7.0  RBC 3.06* 2.85*  --   --  3.36*  HGB 9.1* 8.7* 8.5* 8.5* 9.8*  HCT 28.2* 26.3* 25.0* 25.0* 30.5*  MCV 92.2 92.3  --   --  90.8  MCH 29.7 30.5  --   --  29.2  MCHC 32.3 33.1  --   --  32.1  RDW 14.9 14.5  --   --  14.5  PLT 131* 137*  --   --  191   BNPNo results for input(s): "BNP", "PROBNP" in the last 168 hours.  DDimer No results for input(s): "DDIMER" in the last 168 hours.   Radiology  CARDIAC CATHETERIZATION  Result Date: 08/13/2022   Mid LM to Ost LAD lesion is 95% stenosed with 90% stenosed side branch in Ost Cx.   Ost LAD to Prox LAD lesion is 100% stenosed.   Mid LAD lesion is 80% stenosed with 100% stenosed side branch in 2nd Diag.   Ost Cx to Prox Cx lesion is 100% stenosed.   Dist Cx lesion is 70% stenosed.   Prox RCA lesion is 65% stenosed.   LIMA graft was visualized by angiography and is normal in caliber. The graft exhibits no disease. Widely patent    SVG-D1 graft was visualized by angiography.  The graft exhibits no disease.  Widely patent; diagonal does not retrograde fill the LAD   SVG-OM graft was visualized by angiography and is normal in caliber.  The graft exhibits no disease.Widely patent to a very trivial downstream vessel.   --------------------   LV end diastolic pressure is mildly elevated.   There is no aortic valve stenosis.   -------------------- POSTOP DIAGNOSES Severe disease of the native left coronary artery system with distal Left Main 90%, severe disease in the small AV groove Circumflex with occlusion of the proximal LAD and large dominant OM with minimal competitive flow. Small caliber heavily calcified Right Coronary Artery with proximal 60 to 70% stenosis with explain the level of troponin elevation LVP 163/7 mmHg with Mildly elevated LVEDP 19 to 20 mmHg. Right Heart Cath numbers relatively stable: RAP mean 4 mmHg, RVP-EDP 38/1-6 mmHg, PAP-mean 35/13-22 mmHg, PCWP mean 16 mmHg. Ao sat 95%, PA sat 69%; Cardiac Output-Index 8.25-4.42 (normal) Despite significant elevation of troponin, there is no obvious culprit lesion to explain the patient's symptoms. RECOMMENDATIONS Continue guideline directed medical therapy for CAD Discontinued IV heparin Glenetta Hew, MD   Cardiac Studies  LHC 08/13/2022 Severe disease of the native left coronary artery system with distal Left Main 90%, severe disease in the small AV groove Circumflex with occlusion of the proximal LAD and large dominant OM with minimal competitive flow. Small caliber heavily calcified Right Coronary Artery with proximal 60 to 70% stenosis with explain the level of troponin elevation LVP 163/7 mmHg with Mildly elevated LVEDP 19 to 20 mmHg. Right Heart Cath numbers relatively stable:  RAP mean 4 mmHg, RVP-EDP 38/1-6 mmHg, PAP-mean 35/13-22 mmHg, PCWP mean 16 mmHg. Ao sat 95%, PA sat 69%; Cardiac Output-Index 8.25-4.42 (normal)   Despite significant elevation of troponin,  there is no obvious culprit lesion to explain the patient's symptoms.   LHC 08/08/2022  1. Left ventricular ejection fraction, by estimation, is 65 to 70%. The  left ventricle has normal function. The left ventricle has no regional  wall motion abnormalities. There is moderate asymmetric left ventricular  hypertrophy of the septal segment.  Left ventricular diastolic parameters are consistent with Grade I  diastolic dysfunction (impaired relaxation). Elevated left ventricular  end-diastolic pressure.   2. Right ventricular systolic function is normal. The right ventricular  size is normal. There is mildly elevated pulmonary artery systolic  pressure.   3. Left atrial size was mildly dilated.   4. The mitral valve is normal in structure. Trivial mitral valve  regurgitation. No evidence of mitral stenosis.   5. Tricuspid valve regurgitation is mild to moderate.   6.  The aortic valve is tricuspid. Aortic valve regurgitation is not  visualized. No aortic stenosis is present.   7. Aortic dilatation noted. There is mild dilatation of the ascending  aorta, measuring 37 mm.   8. The inferior vena cava is dilated in size with <50% respiratory  variability, suggesting right atrial pressure of 15 mmHg.   Patient Profile  Buffie Herne is a 81 year old female with history of CAD status post CABG, HFpEF, stroke, SVT, PAD, diabetes, hypertension, hyperlipidemia who was admitted on 08/08/2022 with DKA, hypoxic respiratory failure requiring mechanical ventilation, PE and non-STEMI.  Assessment & Plan   #Non-STEMI #CAD status post CABG -Admitted with critical illness.  Was in DKA and respiratory failure requiring mechanical ventilation.  She was clearly septic. -Had significant troponin elevation.  Echo with normal LV function and no wall motion abnormality. -Left heart catheterization on Friday with no targets for revascularization.  RCA 60 to 70% was thought to be the lesion. -Medical therapy was  recommended. -Plavix and Eliquis as she now has a need for Eliquis. -Lipitor 80 mg daily. -I added Imdur 15 mg daily.  No further chest pain episodes. -She was given Lasix 40 mg as a one-time dose yesterday.  Symptoms are improved. -Also added metoprolol to tartrate 12.5 mg twice daily.  #HFpEF -LVEDP at the time of cath.  Given Lasix 40 mg yesterday.  Symptoms are improving.  Euvolemic on examination.  Would recommend Lasix as needed 20 mg p.o.  #Atrial fibrillation -Maintaining sinus rhythm.  Has had brief SVT.  We will add back metoprolol to tartrate 12.5 mg twice daily.  She is on Eliquis as well.    For questions or updates, please contact [place] Please consult [email or web address] for contact info under   Signed, [place] T. Audie Box, MD, [place]  08/15/2022 11:25 AM

## 2022-08-15 NOTE — Progress Notes (Signed)
Patient stating "I don't feel good, I just want to go home" When I asked patient what wrong, why she doesn't feel good. Patient stated " I don't know" when ask what can I do to make her feel better, she stated nothing, you have done all you can do. Will reassess and try to see what I can do for the patient

## 2022-08-15 NOTE — Progress Notes (Signed)
PROGRESS NOTE    Carrie Kane  AOZ:[id number] DOB: [birth year] DOA: 08/08/2022 PCP: Cletis Athens, MD    Brief Narrative:  This 81 years old female with PMH significant of left displaced femoral neck fracture, T2DM, CAD with Hx of CABG X 3, PAD s/p revascularization in 2022, PSVT, Anemia, Hypothyroidism, hypertension, HLD, CVA (02/2022) who presented to the ED with chief complaints of altered mental status and shortness of breath. She was noted to be hypotensive, have severe metabolic acidosis and hyperglycemia.  He was admitted in the ICU for DKA and managed as per protocol.  She was also found to have elevated troponin cardiology was consulted, recommended left heart cath once renal functions improved.  She is off pressor support.  CT shows small PE.  She was intubated and eventually successfully extubated. TRH pickup 08/11/2022.  Assessment & Plan:   Principal Problem:   Acute metabolic encephalopathy Active Problems:   Coronary artery disease involving native coronary artery of native heart with angina pectoris (HCC)   AKI (acute kidney injury) (HCC)   Altered mental status   Non-ST elevation (NSTEMI) myocardial infarction ([place])   Diabetic ketoacidosis without coma associated with type 2 diabetes mellitus ([place])   Sepsis with acute renal failure and septic shock (HCC)   PAF (paroxysmal atrial fibrillation) (HCC)   Dehydration  Acute metabolic encephalopathy likely multifactorial: > Resolved. Patient presented with DKA, shock and possible infection. Mental status has improved.  DKA resolved. She is back to baseline.  Acute hypoxic respiratory failure requiring mechanical ventilation: She is successfully extubated.   She is weaned down to room air.  NSTEMI: Patient had history of CAD with multiple PCI and CABG. Cardiology is on the board , recommended to initiate heparin gtt. Patient denies any chest pain.  Continue aspirin,  Lipitor. Echocardiogram shows LVEF 65 to 70%. She  underwent left heart cath found to have severe disease but all of the grafts were open. Despite significant elevation of troponin there is no obvious culprit lesion to explain the patient's symptoms on LHC. Heparin discontinued.  Recommended Lipitor, Plavix and Eliquis. She is started on Imdur 15 mg daily. Patient reports chest pain has resolved. Shortness of breath may be from deconditioning.   New onset A-fib: Heart rate is controlled, now in sinus rhythm. Continue Eliquis 5 mg twice daily.  Heparin discontinued. Started on metoprolol 12.5 mg every 12 hours   Acute kidney injury in the setting of hypotension and DKA. Serum creatinine has improved, AKI resolved. Avoid nephrotoxic medications. No acute indication for dialysis at present. Nephrology signed off.  Diabetic ketoacidosis: Resolved.  Continue sliding scale.  Normochromic normocytic anemia without blood loss H&H remains stable.   Monitor H&H while on heparin.  Shock: Undifferentiated. > Resolved. Could be combination of sepsis, hypovolemia in the setting of DKA. She required pressor support. Levophed subsequently discontinued. CT abdomen pelvis shows pericholecystic fluid Ultrasound abdomen negative for acute cholecystitis. General surgery is consulted, if patient declines consider HIDA scan. Patient is improving with medical management. She completed the course of antibiotics.  General surgery signed off.   DVT prophylaxis: Eliquis Code Status: Full code Family Communication: No family at bedside. Disposition Plan:   Status is: Inpatient Remains inpatient appropriate because: Admitted for multiple active issues which are recovering. She underwent left heart cath which was significant for severe disease but all the grafts are open PT and OT recommended SNF.  Patient is medically clear awaiting insurance authorization for SNF placement.   Consultants:  Cardiology Nephrology  PCCM  Procedures:  None Antimicrobials: Anti-infectives (From admission, onward)    Start     Dose/Rate Route Frequency Ordered Stop   08/12/22 1000  ceFEPIme (MAXIPIME) 2 g in sodium chloride 0.9 % 100 mL IVPB        2 g 200 mL/hr over 30 Minutes Intravenous Every 12 hours 08/12/22 0740 08/14/22 1151   08/08/22 2200  ceFEPIme (MAXIPIME) 2 g in sodium chloride 0.9 % 100 mL IVPB  Status:  Discontinued        2 g 200 mL/hr over 30 Minutes Intravenous Every 24 hours 08/08/22 1135 08/12/22 0740   08/08/22 1200  metroNIDAZOLE (FLAGYL) IVPB 500 mg        500 mg 100 mL/hr over 60 Minutes Intravenous Every 8 hours 08/08/22 1105 08/13/22 2359   08/08/22 0745  ceFEPIme (MAXIPIME) 2 g in sodium chloride 0.9 % 100 mL IVPB  Status:  Discontinued        2 g 200 mL/hr over 30 Minutes Intravenous  Once 08/08/22 0649 08/08/22 0816   08/08/22 0745  vancomycin (VANCOREADY) IVPB 1500 mg/300 mL  Status:  Discontinued        1,500 mg 150 mL/hr over 120 Minutes Intravenous  Once 08/08/22 0649 08/08/22 0816   08/08/22 0300  ceFEPIme (MAXIPIME) 2 g in sodium chloride 0.9 % 100 mL IVPB        2 g 200 mL/hr over 30 Minutes Intravenous  Once 08/08/22 0249 08/08/22 0326   08/08/22 0300  vancomycin (VANCOREADY) IVPB 1500 mg/300 mL        1,500 mg 150 mL/hr over 120 Minutes Intravenous  Once 08/08/22 0249 08/08/22 3016       Subjective: Patient was seen and examined at bedside. Overnight events noted. Patient reports feeling much better, She denies any chest pain. She is back to her baseline mental status. She still reports having shortness of breath but which could be from deconditioning.  Objective: Vitals:   08/14/22 1155 08/14/22 1653 08/15/22 0804 08/15/22 1117  BP: 110/64 (!) 107/46 112/67 (!) 103/49  Pulse: 94 99 87 91  Resp: '17  17 17  '$ Temp: (!) 97.5 F (36.4 C)  97.8 F (36.6 C) 98.5 F (36.9 C)  TempSrc:      SpO2: 96% 96% 100% 100%  Weight:      Height:        Intake/Output Summary (Last 24 hours) at  08/15/2022 1206 Last data filed at 08/15/2022 1119 Gross per 24 hour  Intake 240 ml  Output 450 ml  Net -210 ml   Filed Weights   08/11/22 0431 08/12/22 0500 08/14/22 0446  Weight: 71.9 kg 73.1 kg 75 kg    Examination:  General exam: Appears comfortable, not in any acute distress, deconditioned. Respiratory system: CTA bilaterally, respiratory effort normal, RR 13. Cardiovascular system: S1-S2 heard, regular rate and rhythm, no murmur Gastrointestinal system: Abdomen is soft, non tender, non distended, BS+ Central nervous system: Alert and oriented x3, no focal neurological deficits. Extremities: No edema, no cyanosis, no clubbing Skin: No rashes, lesions or ulcers Psychiatry: Judgement and insight appear normal. Mood & affect appropriate.     Data Reviewed: I have personally reviewed following labs and imaging studies  CBC: Recent Labs  Lab 08/09/22 0526 08/10/22 0420 08/11/22 0420 08/12/22 0533 08/13/22 1528 08/13/22 1536 08/14/22 0639  WBC 19.2* 16.8* 8.8 7.8  --   --  7.0  HGB 9.6* 10.0* 9.1* 8.7* 8.5* 8.5* 9.8*  HCT 28.6* 30.6* 28.2*  26.3* 25.0* 25.0* 30.5*  MCV 89.9 90.5 92.2 92.3  --   --  90.8  PLT 188 173 131* 137*  --   --  211   Basic Metabolic Panel: Recent Labs  Lab 08/11/22 0420 08/12/22 0533 08/13/22 0500 08/13/22 1528 08/13/22 1536 08/14/22 0639 08/14/22 1350 08/15/22 0500  NA 142 143 140 139 139 139  --  136  K 3.5 3.4* 3.3* 4.1 4.1 5.2* 4.8 3.9  CL 102 106 104  --   --  104  --  102  CO2 32 28 30  --   --  18*  --  29  GLUCOSE 104* 103* 211*  --   --  371*  --  177*  BUN 37* 29* 23  --   --  24*  --  24*  CREATININE 1.56* 1.03* 0.84  --   --  0.99  --  0.97  CALCIUM 8.5* 8.3* 8.0*  --   --  8.3*  --  8.1*  MG 1.9 1.9 1.9  --   --  2.3  --  2.0  PHOS 2.9 2.2* 1.8*  --   --  3.9  --  1.5*   GFR: Estimated Creatinine Clearance: 47.4 mL/min (by C-G formula based on SCr of 0.97 mg/dL). Liver Function Tests: Recent Labs  Lab  08/09/22 0747 08/10/22 0420 08/11/22 0420 08/12/22 0533  AST 267* 169* 124* 81*  ALT 92* 81* 68* 55*  ALKPHOS 70 80 81 79  BILITOT 0.5 0.5 0.8 1.1  PROT 5.0* 5.2* 4.9* 4.8*  ALBUMIN 2.7* 2.6* 2.4* 2.4*   No results for input(s): "LIPASE", "AMYLASE" in the last 168 hours. No results for input(s): "AMMONIA" in the last 168 hours. Coagulation Profile: Recent Labs  Lab 08/09/22 0745  INR 1.2   Cardiac Enzymes: No results for input(s): "CKTOTAL", "CKMB", "CKMBINDEX", "TROPONINI" in the last 168 hours. BNP (last 3 results) No results for input(s): "PROBNP" in the last 8760 hours. HbA1C: No results for input(s): "HGBA1C" in the last 72 hours. CBG: Recent Labs  Lab 08/14/22 2041 08/14/22 2357 08/15/22 0624 08/15/22 0801 08/15/22 1118  GLUCAP 343* 273* 157* 146* 204*   Lipid Profile: No results for input(s): "CHOL", "HDL", "LDLCALC", "TRIG", "CHOLHDL", "LDLDIRECT" in the last 72 hours.  Thyroid Function Tests: No results for input(s): "TSH", "T4TOTAL", "FREET4", "T3FREE", "THYROIDAB" in the last 72 hours. Anemia Panel: No results for input(s): "VITAMINB12", "FOLATE", "FERRITIN", "TIBC", "IRON", "RETICCTPCT" in the last 72 hours. Sepsis Labs: Recent Labs  Lab 08/09/22 0526 08/09/22 0745 08/10/22 0420  PROCALCITON 7.02  --  5.52  LATICACIDVEN  --  2.8*  --     Recent Results (from the past 240 hour(s))  Blood Culture (routine x 2)     Status: None   Collection Time: 08/08/22  2:10 AM   Specimen: BLOOD RIGHT ARM  Result Value Ref Range Status   Specimen Description BLOOD RIGHT ARM  Final   Special Requests   Final    BOTTLES DRAWN AEROBIC AND ANAEROBIC Blood Culture adequate volume   Culture   Final    NO GROWTH 5 DAYS Performed at [facility], [address], [place], [place] [address]    Report Status 08/13/2022 FINAL  Final  Blood Culture (routine x 2)     Status: None   Collection Time: 08/08/22  2:10 AM   Specimen: BLOOD  Result Value Ref Range  Status   Specimen Description BLOOD BLOOD LEFT ARM  Final  Special Requests   Final    BOTTLES DRAWN AEROBIC AND ANAEROBIC Blood Culture adequate volume   Culture   Final    NO GROWTH 5 DAYS Performed at [facility], [address] [place], [place] [address]    Report Status 08/13/2022 FINAL  Final  Urine Culture     Status: None   Collection Time: 08/08/22  2:10 AM   Specimen: In/Out Cath Urine  Result Value Ref Range Status   Specimen Description   Final    IN/OUT CATH URINE Performed at [facility], [address], [place], [place] [address]    Special Requests   Final    NONE Performed at [facility], [address], [place], [place] [address]    Culture   Final    NO GROWTH Performed at [facility], [place] [address] [place], [place] [address]    Report Status 08/09/2022 FINAL  Final  SARS Coronavirus 2 by RT PCR (hospital order, performed in [facility] hospital lab) *cepheid single result test* Anterior Nasal Swab     Status: None   Collection Time: 08/08/22  5:10 AM   Specimen: Anterior Nasal Swab  Result Value Ref Range Status   SARS Coronavirus 2 by RT PCR NEGATIVE NEGATIVE Final    Comment: (NOTE) SARS-CoV-2 target nucleic acids are NOT DETECTED.  The SARS-CoV-2 RNA is generally detectable in upper and lower respiratory specimens during the acute phase of infection. The lowest concentration of SARS-CoV-2 viral copies this assay can detect is 250 copies / mL. A negative result does not preclude SARS-CoV-2 infection and should not be used as the sole basis for treatment or other patient management decisions.  A negative result may occur with improper specimen collection / handling, submission of specimen other than nasopharyngeal swab, presence of viral mutation(s) within the areas targeted by this assay, and inadequate number of viral copies (<250 copies / mL). A negative result must be combined with clinical observations,  patient history, and epidemiological information.  Fact Sheet for Patients:   https://www.patel.info/  Fact Sheet for Healthcare Providers: https://hall.com/  This test is not yet approved or  cleared by the [place] FDA and has been authorized for detection and/or diagnosis of SARS-CoV-2 by FDA under an Emergency Use Authorization (EUA).  This EUA will remain in effect (meaning this test can be used) for the duration of the COVID-19 declaration under Section 564(b)(1) of the Act, 21 U.S.C. section 360bbb-3(b)(1), unless the authorization is terminated or revoked sooner.  Performed at [facility], [address] [place], [place] [address]   MRSA Next Gen by PCR, Nasal     Status: None   Collection Time: 08/08/22  5:10 AM   Specimen: Anterior Nasal Swab  Result Value Ref Range Status   MRSA by PCR Next Gen NOT DETECTED NOT DETECTED Final    Comment: (NOTE) The GeneXpert MRSA Assay (FDA approved for NASAL specimens only), is one component of a comprehensive MRSA colonization surveillance program. It is not intended to diagnose MRSA infection nor to guide or monitor treatment for MRSA infections. Test performance is not FDA approved in patients less than 80 years old. Performed at [facility], [address] [place], [place] [address]   Culture, Respiratory w Gram Stain     Status: None   Collection Time: 08/09/22  7:54 AM   Specimen: Tracheal Aspirate; Respiratory  Result Value Ref Range Status   Specimen Description   Final  TRACHEAL ASPIRATE Performed at [facility], [address] [place], [place] [address]    Special Requests   Final    NONE Performed at [facility], [place], [place] [address]    Gram Stain   Final    FEW WBC PRESENT,BOTH PMN AND MONONUCLEAR RARE GRAM POSITIVE COCCI IN CLUSTERS RARE BUDDING YEAST SEEN Performed at [facility],  [place] [address] [place], [place] [address]    Culture FEW CANDIDA TROPICALIS  Final   Report Status 08/11/2022 FINAL  Final  Respiratory (~20 pathogens) panel by PCR     Status: None   Collection Time: 08/09/22  7:54 AM   Specimen: Nasopharyngeal Swab; Respiratory  Result Value Ref Range Status   Adenovirus NOT DETECTED NOT DETECTED Final   Coronavirus 229E NOT DETECTED NOT DETECTED Final    Comment: (NOTE) The Coronavirus on the Respiratory Panel, DOES NOT test for the novel  Coronavirus (2019 nCoV)    Coronavirus HKU1 NOT DETECTED NOT DETECTED Final   Coronavirus NL63 NOT DETECTED NOT DETECTED Final   Coronavirus OC43 NOT DETECTED NOT DETECTED Final   Metapneumovirus NOT DETECTED NOT DETECTED Final   Rhinovirus / Enterovirus NOT DETECTED NOT DETECTED Final   Influenza A NOT DETECTED NOT DETECTED Final   Influenza B NOT DETECTED NOT DETECTED Final   Parainfluenza Virus 1 NOT DETECTED NOT DETECTED Final   Parainfluenza Virus 2 NOT DETECTED NOT DETECTED Final   Parainfluenza Virus 3 NOT DETECTED NOT DETECTED Final   Parainfluenza Virus 4 NOT DETECTED NOT DETECTED Final   Respiratory Syncytial Virus NOT DETECTED NOT DETECTED Final   Bordetella pertussis NOT DETECTED NOT DETECTED Final   Bordetella Parapertussis NOT DETECTED NOT DETECTED Final   Chlamydophila pneumoniae NOT DETECTED NOT DETECTED Final   Mycoplasma pneumoniae NOT DETECTED NOT DETECTED Final    Comment: Performed at [facility], [place], [place],  [address]    Radiology Studies: CARDIAC CATHETERIZATION  Result Date: 08/13/2022   Mid LM to [facility] LAD lesion is 95% stenosed with 90% stenosed side branch in Ost Cx.   Ost LAD to Prox LAD lesion is 100% stenosed.   Mid LAD lesion is 80% stenosed with 100% stenosed side branch in 2nd Diag.   Ost Cx to Prox Cx lesion is 100% stenosed.   Dist Cx lesion is 70% stenosed.   Prox RCA lesion is 65% stenosed.   LIMA graft was visualized by angiography and is normal in  caliber. The graft exhibits no disease. Widely patent   SVG-D1 graft was visualized by angiography.  The graft exhibits no disease.  Widely patent; diagonal does not retrograde fill the LAD   SVG-OM graft was visualized by angiography and is normal in caliber.  The graft exhibits no disease.Widely patent to a very trivial downstream vessel.   --------------------   LV end diastolic pressure is mildly elevated.   There is no aortic valve stenosis.   -------------------- POSTOP DIAGNOSES Severe disease of the native left coronary artery system with distal Left Main 90%, severe disease in the small AV groove Circumflex with occlusion of the proximal LAD and large dominant OM with minimal competitive flow. Small caliber heavily calcified Right Coronary Artery with proximal 60 to 70% stenosis with explain the level of troponin elevation LVP 163/7 mmHg with Mildly elevated LVEDP 19 to 20 mmHg. Right Heart Cath numbers relatively stable: RAP mean 4 mmHg, RVP-EDP 38/1-6 mmHg, PAP-mean 35/13-22 mmHg, PCWP mean 16  mmHg. Ao sat 95%, PA sat 69%; Cardiac Output-Index 8.25-4.42 (normal) Despite significant elevation of troponin, there is no obvious culprit lesion to explain the patient's symptoms. RECOMMENDATIONS Continue guideline directed medical therapy for CAD Discontinued IV heparin Glenetta Hew, MD   Scheduled Meds:  apixaban  5 mg Oral BID   atorvastatin  80 mg Oral Daily   Chlorhexidine Gluconate Cloth  6 each Topical Daily   clopidogrel  75 mg Oral Daily   feeding supplement  237 mL Oral TID BM   insulin aspart  0-9 Units Subcutaneous Q4H   insulin glargine-yfgn  8 Units Subcutaneous Daily   isosorbide mononitrate  15 mg Oral Daily   metoprolol tartrate  12.5 mg Oral BID   multivitamin with minerals  1 tablet Oral Daily   mouth rinse  15 mL Mouth Rinse Q6H   pantoprazole (PROTONIX) IV  40 mg Intravenous QHS   phosphorus  250 mg Oral TID   senna-docusate  1 tablet Oral BID   sodium chloride flush  3 mL  Intravenous Q12H   sodium chloride flush  3 mL Intravenous Q12H   Continuous Infusions:  sodium chloride 250 mL (08/14/22 0114)     LOS: 7 days    Time spent: 35 mins    Carrie Pfiffner, MD [facility]   If 7PM-7AM, please contact night-coverage

## 2022-08-15 NOTE — Consult Note (Signed)
[place] for Electrolyte Monitoring and Replacement   Recent Labs: Potassium (mmol/L)  Date Value  08/15/2022 3.9   Magnesium (mg/dL)  Date Value  08/15/2022 2.0   Calcium (mg/dL)  Date Value  08/15/2022 8.1 (L)   Albumin (g/dL)  Date Value  08/12/2022 2.4 (L)  07/15/2022 3.9   Phosphorus (mg/dL)  Date Value  08/15/2022 1.5 (L)   Sodium (mmol/L)  Date Value  08/15/2022 136  07/15/2022 138   Assessment: Pharmacy has been consulted to monitor and replace electrolytes in 81yo female admitted with acute respiratory failure, AKI, DKA, CAD (NSTEMI / CABG), Afib, and lactic acidosis. Patient was transferred to ICU intubated. -extubated, transferred to floor  Goal of Therapy:  Electrolytes WNL; K>4, Mg >2  Plan:  --Phos 1.5, K Phos Neutral 250 mg TID x 6 doses per primary team --Follow-up electrolytes as ordered per primary team and replace as indicated  Benita Gutter  08/15/2022

## 2022-08-15 NOTE — TOC Progression Note (Addendum)
Transition of Care [facility]) - Progression Note    Patient Details  Name: Carrie Kane MRN: [id number] Date of Birth: 1940/11/26  Transition of Care [facility]) CM/SW [place], LCSW Phone Number: 08/15/2022, 9:50 AM  Clinical Narrative:  Josem Kaufmann still pending.   3:02 pm: Auth still pending.  Expected Discharge Plan and Services                                                 Social Determinants of Health (SDOH) Interventions    Readmission Risk Interventions    08/13/2022   12:08 PM  Readmission Risk Prevention Plan  Transportation Screening Complete  HRI or Home Care Consult Complete  Palliative Care Screening Not Applicable  Medication Review (RN Care Manager) Complete

## 2022-08-16 ENCOUNTER — Telehealth: Payer: Self-pay | Admitting: *Deleted

## 2022-08-16 DIAGNOSIS — G9341 Metabolic encephalopathy: Secondary | ICD-10-CM | POA: Diagnosis not present

## 2022-08-16 DIAGNOSIS — I214 Non-ST elevation (NSTEMI) myocardial infarction: Secondary | ICD-10-CM | POA: Diagnosis not present

## 2022-08-16 LAB — GLUCOSE, CAPILLARY
Glucose-Capillary: 85 mg/dL (ref 70–99)
Glucose-Capillary: 189 mg/dL — ABNORMAL HIGH (ref 70–99)
Glucose-Capillary: 279 mg/dL — ABNORMAL HIGH (ref 70–99)
Glucose-Capillary: 311 mg/dL — ABNORMAL HIGH (ref 70–99)

## 2022-08-16 LAB — BASIC METABOLIC PANEL
Anion gap: 7 (ref 5–15)
BUN: 23 mg/dL (ref 8–23)
CO2: 28 mmol/L (ref 22–32)
Calcium: 8.2 mg/dL — ABNORMAL LOW (ref 8.9–10.3)
Chloride: 104 mmol/L (ref 98–111)
Creatinine, Ser: 0.78 mg/dL (ref 0.44–1.00)
GFR, Estimated: >60 mL/min (ref >60)
Glucose, Bld: 100 mg/dL — ABNORMAL HIGH (ref 70–99)
Potassium: 3.7 mmol/L (ref 3.5–5.1)
Sodium: 139 mmol/L (ref 135–145)

## 2022-08-16 LAB — LIPID PANEL
Cholesterol: 116 mg/dL (ref 0–200)
HDL: 55 mg/dL (ref >40)
LDL Cholesterol: 48 mg/dL (ref 0–99)
Total CHOL/HDL Ratio: 2.1 RATIO
Triglycerides: 64 mg/dL (ref <150)
VLDL: 13 mg/dL (ref 0–40)

## 2022-08-16 LAB — MAGNESIUM: Magnesium: 2 mg/dL (ref 1.7–2.4)

## 2022-08-16 LAB — PHOSPHORUS: Phosphorus: 2.2 mg/dL — ABNORMAL LOW (ref 2.5–4.6)

## 2022-08-16 MED ORDER — ATORVASTATIN CALCIUM 80 MG PO TABS: 40.0000 mg | ORAL_TABLET | Freq: Every day | ORAL | 1 refills | Status: AC

## 2022-08-16 MED ORDER — ISOSORBIDE MONONITRATE ER 30 MG PO TB24: 15.0000 mg | ORAL_TABLET | Freq: Every day | ORAL | 1 refills | Status: AC

## 2022-08-16 MED ORDER — APIXABAN 5 MG PO TABS: 5.0000 mg | ORAL_TABLET | Freq: Two times a day (BID) | ORAL | 2 refills | Status: AC

## 2022-08-16 MED ORDER — METOPROLOL TARTRATE 25 MG PO TABS: 12.5000 mg | ORAL_TABLET | Freq: Two times a day (BID) | ORAL | 2 refills | Status: AC

## 2022-08-16 NOTE — Discharge Summary (Addendum)
Physician Discharge Summary  Carrie Kane ZRA:[id number] DOB: October 31, 1940 DOA: 08/08/2022  PCP: Cletis Athens, MD  Admit date: 08/08/2022  Discharge date: 08/16/2022  Admitted From: Home.  Disposition:  [employer]NF[employer]er, [employer])  Recommendations for Outpatient Follow-up:  Follow up with PCP in 1-2 weeks. Please obtain BMP/CBC in one week. Advised to follow-up with Cardiology as scheduled. Advised to continue following medications. Advised Eliquis 5 mg twice daily for atrial fibrillation.  Home Health: None Equipment/Devices:None  Discharge Condition: Stable CODE STATUS:Full code Diet recommendation: Heart Healthy   Brief [facility] Course: This 81 years old female with PMH significant of left displaced femoral neck fracture, T2DM, CAD with Hx of CABG X 3, PAD s/p revascularization in 2022, PSVT, Anemia, Hypothyroidism, hypertension, HLD, CVA (02/2022) who presented to the ED with chief complaints of altered mental status and shortness of breath. She was noted to be hypotensive, have severe metabolic acidosis and hyperglycemia.  He was admitted in the ICU for DKA and managed as per protocol.  She was also found to have elevated troponin , Cardiology was consulted, recommended left heart cath once renal functions improved.  She is off pressor support.  CT shows small PE.  She was intubated and eventually successfully extubated. TRH pickup 08/11/2022.  Renal functions has improved.  Then patient underwent left heart catheterization.  Left heart catheterization shows severe disease but all the grafts were patent and open.  Patient was started on Eliquis for new onset atrial fibrillation.  She feels better,  PT and OT recommended skilled nursing facility. Cardiology signed off.  Patient is being discharged to skilled nursing facility for rehab.  Discharge Diagnoses:  Principal Problem:   Acute metabolic encephalopathy Active Problems:   Coronary artery disease involving native coronary  artery of native heart with angina pectoris (HCC)   AKI (acute kidney injury) (HCC)   Altered mental status   Non-ST elevation (NSTEMI) myocardial infarction ([place])   Diabetic ketoacidosis without coma associated with type 2 diabetes mellitus ([place])   Sepsis with acute renal failure and septic shock (HCC)   PAF (paroxysmal atrial fibrillation) (HCC)   Dehydration  Acute metabolic encephalopathy likely multifactorial: > Resolved. Patient presented with DKA, shock and possible infection. Mental status has improved.  DKA resolved. She is back to baseline.   Acute hypoxic respiratory failure requiring mechanical ventilation: She is successfully extubated.   She is weaned down to room air.   NSTEMI: Patient had history of CAD with multiple PCI and CABG. Cardiology is on the board , recommended to initiate heparin gtt. Patient denies any chest pain.  Continue aspirin,  Lipitor. Echocardiogram shows LVEF 65 to 70%. She underwent left heart cath found to have severe disease but all of the grafts were open. Despite significant elevation of troponin there is no obvious culprit lesion to explain the patient's symptoms on LHC. Heparin discontinued.  Recommended Lipitor, Plavix and Eliquis. She is started on Imdur 15 mg daily. Patient reports chest pain has resolved. Shortness of breath may be from deconditioning. Advised outpatient follow-up with cardiology.   New onset A-fib: Heart rate is controlled, now in sinus rhythm. Continue Eliquis 5 mg twice daily.  Heparin discontinued. Started on metoprolol 12.5 mg every 12 hours   Acute kidney injury in the setting of hypotension and DKA. Serum creatinine has improved, AKI resolved. Avoid nephrotoxic medications. No acute indication for dialysis at present. Nephrology signed off.   Diabetic ketoacidosis: Resolved.  Continue sliding scale.   Normochromic normocytic anemia without blood loss  H&H remains stable.   Monitor H&H while on  heparin.   Shock: Undifferentiated. > Resolved. Could be combination of sepsis, hypovolemia in the setting of DKA. She required pressor support. Levophed subsequently discontinued. CT abdomen pelvis shows pericholecystic fluid Ultrasound abdomen negative for acute cholecystitis. General surgery is consulted, if patient declines consider HIDA scan. Patient is improving with medical management. She completed the course of antibiotics.  General surgery signed off.  Discharge Instructions  Discharge Instructions     Call MD for:  difficulty breathing, headache or visual disturbances   Complete by: As directed    Call MD for:  persistant dizziness or light-headedness   Complete by: As directed    Call MD for:  persistant nausea and vomiting   Complete by: As directed    Diet - low sodium heart healthy   Complete by: As directed    Diet Carb Modified   Complete by: As directed    Discharge instructions   Complete by: As directed    Advised to follow-up with primary care physician in 1 week. Advised to follow-up with cardiology as scheduled. Advised to continue following medications. Occasionally Eliquis 5 mg twice daily for atrial fibrillation   Increase activity slowly   Complete by: As directed       Allergies as of 08/16/2022       Reactions   Shellfish Allergy Anaphylaxis   Throat swelling   Flexeril [cyclobenzaprine] Hypertension        Medication List     STOP taking these medications    aspirin EC 81 MG tablet   cloNIDine 0.2 MG tablet Commonly known as: CATAPRES   hydrochlorothiazide 12.5 MG capsule Commonly known as: MICROZIDE   valsartan 320 MG tablet Commonly known as: DIOVAN       TAKE these medications    apixaban 5 MG Tabs tablet Commonly known as: ELIQUIS Take 1 tablet (5 mg total) by mouth 2 (two) times daily.   atorvastatin 80 MG tablet Commonly known as: LIPITOR Take 0.5 tablets (40 mg total) by mouth daily. What changed:  medication strength   BD Insulin Syringe U/F 30G X 1/2" 0.5 ML Misc Generic drug: Insulin Syringe-Needle U-100   cholecalciferol 1000 units tablet Commonly known as: VITAMIN D Take 2,000 Units by mouth daily at 12 noon.   clopidogrel 75 MG tablet Commonly known as: PLAVIX Take 1 tablet (75 mg total) by mouth daily.   ferrous sulfate 325 (65 FE) MG EC tablet Take 1 tablet (325 mg total) by mouth daily at 2 PM.   HumaLOG KwikPen 100 UNIT/ML KwikPen Generic drug: insulin lispro INJECT 38 UNITS UNDER THE SKIN DAILY What changed: See the new instructions.   insulin glargine 100 UNIT/ML injection Commonly known as: Lantus INJECT 45 UNITS UNDER THE SKIN AT BEDTIME What changed:  how much to take when to take this additional instructions   isosorbide mononitrate 30 MG 24 hr tablet Commonly known as: IMDUR Take 0.5 tablets (15 mg total) by mouth daily. Start taking on: August 17, 2022   levothyroxine 100 MCG tablet Commonly known as: SYNTHROID TAKE 1 TABLET DAILY BEFORE BREAKFAST   metoprolol tartrate 25 MG tablet Commonly known as: LOPRESSOR Take 0.5 tablets (12.5 mg total) by mouth 2 (two) times daily.   multivitamin capsule Take 1 capsule by mouth daily at 12 noon.   Na Sulfate-K Sulfate-Mg Sulf 17.5-3.13-1.6 GM/177ML Soln Take by mouth as directed.   nitroGLYCERIN 0.4 MG SL tablet Commonly known as: Scientist, clinical (histocompatibility and immunogenetics)  1 tablet (0.4 mg total) under the tongue every 5 (five) minutes as needed for chest pain.   SYSTANE OP Apply 1 drop to eye as needed (dry eyes).        Contact information for follow-up providers     Minna Merritts, MD Follow up in 1 week(s).   Specialty: Cardiology Contact information: [place] [address] [phone]              Contact information for after-discharge care     [facility]erred [facility] .    Service: Skilled Nursing Contact information: [place] [phone]                    Allergies  Allergen Reactions   Shellfish Allergy Anaphylaxis    Throat swelling   Flexeril [Cyclobenzaprine] Hypertension    Consultations: Cardiology   Procedures/Studies: CARDIAC CATHETERIZATION  Result Date: 08/13/2022   Mid LM to Ost LAD lesion is 95% stenosed with 90% stenosed side branch in Ost Cx.   Ost LAD to Prox LAD lesion is 100% stenosed.   Mid LAD lesion is 80% stenosed with 100% stenosed side branch in 2nd Diag.   Ost Cx to Prox Cx lesion is 100% stenosed.   Dist Cx lesion is 70% stenosed.   Prox RCA lesion is 65% stenosed.   LIMA graft was visualized by angiography and is normal in caliber. The graft exhibits no disease. Widely patent   SVG-D1 graft was visualized by angiography.  The graft exhibits no disease.  Widely patent; diagonal does not retrograde fill the LAD   SVG-OM graft was visualized by angiography and is normal in caliber.  The graft exhibits no disease.Widely patent to a very trivial downstream vessel.   --------------------   LV end diastolic pressure is mildly elevated.   There is no aortic valve stenosis.   -------------------- POSTOP DIAGNOSES Severe disease of the native left coronary artery system with distal Left Main 90%, severe disease in the small AV groove Circumflex with occlusion of the proximal LAD and large dominant OM with minimal competitive flow. Small caliber heavily calcified Right Coronary Artery with proximal 60 to 70% stenosis with explain the level of troponin elevation LVP 163/7 mmHg with Mildly elevated LVEDP 19 to 20 mmHg. Right Heart Cath numbers relatively stable: RAP mean 4 mmHg, RVP-EDP 38/1-6 mmHg, PAP-mean 35/13-22 mmHg, PCWP mean 16 mmHg. Ao sat 95%, PA sat 69%; Cardiac Output-Index 8.25-4.42 (normal) Despite significant elevation of troponin, there is no obvious culprit lesion to  explain the patient's symptoms. RECOMMENDATIONS Continue guideline directed medical therapy for CAD Discontinued IV heparin Glenetta Hew, MD  DG Chest Port 1 View  Result Date: 08/10/2022 CLINICAL DATA:  81 year old female with respiratory failure and hypoxia. Sepsis. Intubated. EXAM: PORTABLE CHEST 1 VIEW COMPARISON:  CTA chest 08/08/2022 and earlier. FINDINGS: Portable AP semi upright view at 0419 hours. The patient is more rotated to the right. Endotracheal tube tip is stable at the level the clavicles. Left subclavian vascular catheter is stable. Enteric tube courses to the abdomen, tip not included. Mildly lower lung volumes. Prior CABG. Stable cardiac size and mediastinal contours. No pneumothorax, pleural effusion or consolidation. Mildly increased crowding of markings or vascular congestion. No overt edema. Stable visualized osseous structures. Prior sternotomy. Negative visible bowel gas. IMPRESSION: 1. Stable lines and tubes. 2. Mildly  lower lung volumes with increased crowding of markings or vascular congestion. But no overt edema. Electronically Signed   By: Genevie Ann M.D.   On: 08/10/2022 04:56   US Abdomen Limited RUQ (LIVER/GB)  Result Date: 08/09/2022 CLINICAL DATA:  Elevated LFTs EXAM: ULTRASOUND ABDOMEN LIMITED RIGHT UPPER QUADRANT COMPARISON:  CT abdomen and pelvis 08/08/2022 FINDINGS: Gallbladder: Normally distended with small shadowing calculi up to 5 mm diameter. No definite gallbladder wall thickening or sonographic Murphy sign. Free fluid adjacent to gallbladder. Common bile duct: Diameter: 2 mm Liver: Echogenic parenchyma, likely fatty infiltration though this can be seen with cirrhosis and certain infiltrative disorders. No hepatic mass or nodularity. Portal vein is patent on color Doppler imaging with normal direction of blood flow towards the liver. Other: N/A IMPRESSION: Cholelithiasis. Free fluid adjacent to gallbladder without definite evidence of acute cholecystitis. Probable  fatty infiltration of liver as above. Electronically Signed   By: Lavonia Dana M.D.   On: 08/09/2022 11:31   ECHOCARDIOGRAM COMPLETE  Result Date: 08/08/2022    ECHOCARDIOGRAM REPORT   Patient Name:   Carrie Kane Date of Exam: 08/08/2022 Medical Rec #:  [id number]      Height:       68.0 in Accession #:    [id number]     Weight:       140.0 lb Date of Birth:  06/09/1941      BSA:          1.756 m Patient Age:    10 years       BP:           113/59 mmHg Patient Gender: F              HR:           98 bpm. Exam Location:  [facility] Procedure: 2D Echo Indications:     Diastolic CHF  History:         Patient has prior history of Echocardiogram examinations. CAD,                  Prior CABG, Stroke; Risk Factors:Hypertension.  Sonographer:     L. Thornton-Maynard Referring Phys:  [place] Diagnosing Phys: Skeet Latch MD IMPRESSIONS  1. Left ventricular ejection fraction, by estimation, is 65 to 70%. The left ventricle has normal function. The left ventricle has no regional wall motion abnormalities. There is moderate asymmetric left ventricular hypertrophy of the septal segment. Left ventricular diastolic parameters are consistent with Grade I diastolic dysfunction (impaired relaxation). Elevated left ventricular end-diastolic pressure.  2. Right ventricular systolic function is normal. The right ventricular size is normal. There is mildly elevated pulmonary artery systolic pressure.  3. Left atrial size was mildly dilated.  4. The mitral valve is normal in structure. Trivial mitral valve regurgitation. No evidence of mitral stenosis.  5. Tricuspid valve regurgitation is mild to moderate.  6. The aortic valve is tricuspid. Aortic valve regurgitation is not visualized. No aortic stenosis is present.  7. Aortic dilatation noted. There is mild dilatation of the ascending aorta, measuring 37 mm.  8. The inferior vena cava is dilated in size with <50% respiratory variability, suggesting right atrial  pressure of 15 mmHg. FINDINGS  Left Ventricle: Left ventricular ejection fraction, by estimation, is 65 to 70%. The left ventricle has normal function. The left ventricle has no regional wall motion abnormalities. The left ventricular internal cavity size was normal in size. There is  moderate asymmetric left ventricular hypertrophy of  the septal segment. Left ventricular diastolic parameters are consistent with Grade I diastolic dysfunction (impaired relaxation). Elevated left ventricular end-diastolic pressure. Right Ventricle: The right ventricular size is normal. No increase in right ventricular wall thickness. Right ventricular systolic function is normal. There is mildly elevated pulmonary artery systolic pressure. The tricuspid regurgitant velocity is 2.55  m/s, and with an assumed right atrial pressure of 15 mmHg, the estimated right ventricular systolic pressure is 90.2 mmHg. Left Atrium: Left atrial size was mildly dilated. Right Atrium: Right atrial size was normal in size. Pericardium: There is no evidence of pericardial effusion. Mitral Valve: The mitral valve is normal in structure. There is moderate thickening of the anterior mitral valve leaflet(s). Mild mitral annular calcification. Trivial mitral valve regurgitation. No evidence of mitral valve stenosis. MV peak gradient, 9.1 mmHg. The mean mitral valve gradient is 4.0 mmHg. Tricuspid Valve: The tricuspid valve is normal in structure. Tricuspid valve regurgitation is mild to moderate. No evidence of tricuspid stenosis. Aortic Valve: The aortic valve is tricuspid. Aortic valve regurgitation is not visualized. No aortic stenosis is present. Aortic valve mean gradient measures 8.0 mmHg. Aortic valve peak gradient measures 14.4 mmHg. Aortic valve area, by VTI measures 2.01  cm. Pulmonic Valve: The pulmonic valve was normal in structure. Pulmonic valve regurgitation is trivial. No evidence of pulmonic stenosis. Aorta: Aortic dilatation noted. There is  mild dilatation of the ascending aorta, measuring 37 mm. Venous: The inferior vena cava is dilated in size with less than 50% respiratory variability, suggesting right atrial pressure of 15 mmHg. IAS/Shunts: No atrial level shunt detected by color flow Doppler.  LEFT VENTRICLE PLAX 2D LVIDd:         3.70 cm     Diastology LVIDs:         2.10 cm     LV e' medial:    4.30 cm/s LV PW:         1.00 cm     LV E/e' medial:  27.0 LV IVS:        1.30 cm     LV e' lateral:   6.32 cm/s LVOT diam:     1.80 cm     LV E/e' lateral: 18.4 LV SV:         56 LV SV Index:   32 LVOT Area:     2.54 cm  LV Volumes (MOD) LV vol d, MOD A2C: 43.8 ml LV vol d, MOD A4C: 41.0 ml LV vol s, MOD A2C: 16.3 ml LV vol s, MOD A4C: 12.9 ml LV SV MOD A2C:     27.5 ml LV SV MOD A4C:     41.0 ml LV SV MOD BP:      28.0 ml RIGHT VENTRICLE RV Basal diam:  2.90 cm RV S prime:     7.30 cm/s TAPSE (M-mode): 0.9 cm LEFT ATRIUM             Index        RIGHT ATRIUM           Index LA diam:        3.10 cm 1.77 cm/m   RA Area:     14.50 cm LA Vol (A2C):   66.0 ml 37.58 ml/m  RA Volume:   34.40 ml  19.59 ml/m LA Vol (A4C):   57.2 ml 32.57 ml/m LA Biplane Vol: 61.6 ml 35.07 ml/m  AORTIC VALVE  PULMONIC VALVE AV Area (Vmax):    2.02 cm      PV Vmax:          1.08 m/s AV Area (Vmean):   2.01 cm      PV Peak grad:     4.6 mmHg AV Area (VTI):     2.01 cm      PR End Diast Vel: 5.06 msec AV Vmax:           190.00 cm/s AV Vmean:          128.000 cm/s AV VTI:            0.277 m AV Peak Grad:      14.4 mmHg AV Mean Grad:      8.0 mmHg LVOT Vmax:         151.00 cm/s LVOT Vmean:        101.000 cm/s LVOT VTI:          0.219 m LVOT/AV VTI ratio: 0.79  AORTA Ao Root diam: 3.10 cm Ao Asc diam:  3.50 cm MITRAL VALVE                TRICUSPID VALVE MV Area (PHT): 3.54 cm     TR Peak grad:   26.0 mmHg MV Area VTI:   1.49 cm     TR Vmax:        255.00 cm/s MV Peak grad:  9.1 mmHg MV Mean grad:  4.0 mmHg     SHUNTS MV Vmax:       1.51 m/s     Systemic  VTI:  0.22 m MV Vmean:      91.2 cm/s    Systemic Diam: 1.80 cm MV Decel Time: 214 msec MV E velocity: 116.00 cm/s MV A velocity: 160.00 cm/s MV E/A ratio:  0.72 Skeet Latch MD Electronically signed by Skeet Latch MD Signature Date/Time: 08/08/2022/1:23:04 PM    Final    CT Angio Chest Pulmonary Embolism (PE) W or WO Contrast  Result Date: 08/08/2022 CLINICAL DATA:  Sepsis of unknown source. EXAM: CT ANGIOGRAPHY CHEST CT ABDOMEN AND PELVIS WITH CONTRAST TECHNIQUE: Multidetector CT imaging of the chest was performed using the standard protocol during bolus administration of intravenous contrast. Multiplanar CT image reconstructions and MIPs were obtained to evaluate the vascular anatomy. Multidetector CT imaging of the abdomen and pelvis was performed using the standard protocol during bolus administration of intravenous contrast. RADIATION DOSE REDUCTION: This exam was performed according to the departmental dose-optimization program which includes automated exposure control, adjustment of the mA and/or kV according to patient size and/or use of iterative reconstruction technique. CONTRAST:  55m OMNIPAQUE IOHEXOL 350 MG/ML SOLN COMPARISON:  Chest CT with contrast 10/04/2004. No prior abdomen pelvis CT. Most recently, 2 portable chest films obtained today, portable chest 05/10/2022 FINDINGS: CTA CHEST FINDINGS Support devices: ETT in place 3.4 cm from the carina. NGT curves to the left in the stomach with the tip abutting the proximal fundal wall. Left IJ line terminates at about the superior cavoatrial junction. Cardiovascular: Interval CABG with healed sternotomy. Native coronary arteries have become heavily calcified since the previous study in the heart has undergone mild-to-moderate interval enlargement, with pan chamber involvement. There is increased distention of the central pulmonary veins. No pericardial effusion is seen. The mitral ring has become heavily calcified except for medially where  there is only minimal calcification. The pulmonary trunk, as before is prominent measuring 3.5 cm indicating arterial hypertension, but no arterial embolus is  seen. There are increased calcific plaques in the aorta, proximal subclavian or. There is no aortic aneurysm, stenosis or dissection. Mediastinum/Nodes: There is tortuous but otherwise unremarkable thoracic esophagus. Thoracic trachea clear except for the T2. The main bronchi are clear. There is no intrathoracic or axillary adenopathy or thyroid mass. Lungs/Pleura: There are trace layering pleural effusions. There is interval increased bilateral lower lobe bronchiectasis and posterior basal bronchiolectasis, but no appreciable impacted bronchi. There is subpleural atelectasis in the posterior extreme lung bases. Minimal interstitial edema is noted in the lung bases and apices. There is a 6 mm ground-glass nodule posteriorly in the right upper lobe lung apex on 3:39. There is a 4 mm subpleural ground-glass nodule laterally in the right middle lobe on 3:98. No confluent pneumonia or other focal lung opacity is seen. 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 No acute or significant osseous findings. There is body wall edema in the chest wall. Review of the MIP images confirms the above findings. CT ABDOMEN and PELVIS FINDINGS Hepatobiliary: The liver is mildly steatotic. The intrahepatic periportal edema. Small amount of perihepatic/pericholecystic ascites. The gallbladder free wall slightly thickened and there are stones in the proximal lumen without biliary dilatation. No liver mass is seen. 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 No acute inflammatory change is seen, no mass enhancement. Spleen: Normal in size with homogeneous enhancement. Adrenals/Urinary Tract: New finding of 1.9 x 1.6 cm left adrenal nodule measuring 58 Hounsfield units on this  postcontrast study. Adrenal dedicated MRI recommended when clinically feasible. Right adrenals unremarkable. There is a 2.2 cm homogeneous thin walled cyst in the upper pole of the left kidney of 9 Hounsfield units. No follow-up imaging is recommended. There are few additional too small to characterize hypodensities in the left kidney. Right kidney is unremarkable. There are bilateral linear as well rounded calcifications scattered along both renal collecting systems. Most of this appears to be renovascular calcification but there may be a few small nonobstructive caliceal stones in both kidneys. There is no ureteral stone or hydronephrosis to the level of the left acetabulum, below which the remainder of the distal ureters and bladder are obscured by extensive spray artifact from a left hip replacement. In the delayed phase, no contrast is seen in either collecting system. Stomach/Bowel: No dilatation or wall thickening including of the appendix. Sigmoid diverticulosis without evidence of diverticulitis. The rectum largely obscured by her left hip replacement. Vascular/Lymphatic: There is heavy aortoiliac calcific plaque without AAA. No adenopathy is seen. Reproductive: Status post hysterectomy. No adnexal masses. Other: There is mild-to-moderate body wall anasarca. There is generalized mesenteric congestion. Minimal ascites abdomen and pelvis. No free air, abscess or hemorrhage. Musculoskeletal: Mild osteopenia and mild degenerative change lumbar spine. Left hip arthroplasty. Review of the MIP images confirms the above findings. IMPRESSION: 1. Chronically dilated pulmonary trunk. No arterial embolus is seen. 2. Cardiomegaly with venous distention, slight interstitial edema in the lung apices and bases, minimal pleural effusions and body wall anasarca. 3. Mesenteric congestion and minimal ascites in the abdomen and pelvis. 4. Increased bronchiectasis and bronchiolectasis in both lower lobes since 2005 but no active  infiltrate or bronchial impactions. 5. 6 mm and 4 mm right lung ground-glass nodules. Recommend follow-up chest CT 3-6 months. If stable, repeat follow-up CT 18-24 months then at 2 year intervals until 5 years of stability is documented. Consider resection if either nodule develops solid components or substantially grows. These guidelines do  not apply to cancer patients and immunocompromised patients. 6. CABG with native CAD, aortoiliac atherosclerosis. 7. Pericholecystic fluid and slightly thickened gallbladder wall with stones. This most likely congestive etiology but please correlate clinically to exclude cholecystitis. No other appreciable source for sepsis is seen. 8. 1.9 x 1.6 cm heterogeneous left adrenal nodule not seen in 2005. Further evaluation recommended. 9. Linear and rounded calcifications along the renal collecting systems, most of which are probably renovascular calcifications. There may be a few small caliceal stones. 10. No collecting system contrast in the delayed phase. This could be on a congestive basis or due to heart failure, but could also be seen with nephrotoxicity. Laboratory and clinical correlation advised. 11. Remaining findings discussed above. Electronically Signed   By: Telford Nab M.D.   On: 08/08/2022 05:31   CT ABDOMEN PELVIS W CONTRAST  Result Date: 08/08/2022 CLINICAL DATA:  Sepsis of unknown source. EXAM: CT ANGIOGRAPHY CHEST CT ABDOMEN AND PELVIS WITH CONTRAST TECHNIQUE: Multidetector CT imaging of the chest was performed using the standard protocol during bolus administration of intravenous contrast. Multiplanar CT image reconstructions and MIPs were obtained to evaluate the vascular anatomy. Multidetector CT imaging of the abdomen and pelvis was performed using the standard protocol during bolus administration of intravenous contrast. RADIATION DOSE REDUCTION: This exam was performed according to the departmental dose-optimization program which includes automated  exposure control, adjustment of the mA and/or kV according to patient size and/or use of iterative reconstruction technique. CONTRAST:  85m OMNIPAQUE IOHEXOL 350 MG/ML SOLN COMPARISON:  Chest CT with contrast 10/04/2004. No prior abdomen pelvis CT. Most recently, 2 portable chest films obtained today, portable chest 05/10/2022 FINDINGS: CTA CHEST FINDINGS Support devices: ETT in place 3.4 cm from the carina. NGT curves to the left in the stomach with the tip abutting the proximal fundal wall. Left IJ line terminates at about the superior cavoatrial junction. Cardiovascular: Interval CABG with healed sternotomy. Native coronary arteries have become heavily calcified since the previous study in the heart has undergone mild-to-moderate interval enlargement, with pan chamber involvement. There is increased distention of the central pulmonary veins. No pericardial effusion is seen. The mitral ring has become heavily calcified except for medially where there is only minimal calcification. The pulmonary trunk, as before is prominent measuring 3.5 cm indicating arterial hypertension, but no arterial embolus is seen. There are increased calcific plaques in the aorta, proximal subclavian or. There is no aortic aneurysm, stenosis or dissection. Mediastinum/Nodes: There is tortuous but otherwise unremarkable thoracic esophagus. Thoracic trachea clear except for the T2. The main bronchi are clear. There is no intrathoracic or axillary adenopathy or thyroid mass. Lungs/Pleura: There are trace layering pleural effusions. There is interval increased bilateral lower lobe bronchiectasis and posterior basal bronchiolectasis, but no appreciable impacted bronchi. There is subpleural atelectasis in the posterior extreme lung bases. Minimal interstitial edema is noted in the lung bases and apices. There is a 6 mm ground-glass nodule posteriorly in the right upper lobe lung apex on 3:39. There is a 4 mm subpleural ground-glass nodule  laterally in the right middle lobe on 3:98. No confluent pneumonia or other focal lung opacity is seen. 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 No acute or significant osseous findings. There is body wall edema in the chest wall. Review of the MIP images confirms the above findings. CT ABDOMEN and PELVIS FINDINGS Hepatobiliary: The liver is mildly steatotic. The intrahepatic periportal edema. Small amount of perihepatic/pericholecystic ascites. The gallbladder free  wall slightly thickened and there are stones in the proximal lumen without biliary dilatation. No liver mass is seen. 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 No acute inflammatory change is seen, no mass enhancement. Spleen: Normal in size with homogeneous enhancement. Adrenals/Urinary Tract: New finding of 1.9 x 1.6 cm left adrenal nodule measuring 58 Hounsfield units on this postcontrast study. Adrenal dedicated MRI recommended when clinically feasible. Right adrenals unremarkable. There is a 2.2 cm homogeneous thin walled cyst in the upper pole of the left kidney of 9 Hounsfield units. No follow-up imaging is recommended. There are few additional too small to characterize hypodensities in the left kidney. Right kidney is unremarkable. There are bilateral linear as well rounded calcifications scattered along both renal collecting systems. Most of this appears to be renovascular calcification but there may be a few small nonobstructive caliceal stones in both kidneys. There is no ureteral stone or hydronephrosis to the level of the left acetabulum, below which the remainder of the distal ureters and bladder are obscured by extensive spray artifact from a left hip replacement. In the delayed phase, no contrast is seen in either collecting system. Stomach/Bowel: No dilatation or wall thickening including of the appendix. Sigmoid diverticulosis  without evidence of diverticulitis. The rectum largely obscured by her left hip replacement. Vascular/Lymphatic: There is heavy aortoiliac calcific plaque without AAA. No adenopathy is seen. Reproductive: Status post hysterectomy. No adnexal masses. Other: There is mild-to-moderate body wall anasarca. There is generalized mesenteric congestion. Minimal ascites abdomen and pelvis. No free air, abscess or hemorrhage. Musculoskeletal: Mild osteopenia and mild degenerative change lumbar spine. Left hip arthroplasty. Review of the MIP images confirms the above findings. IMPRESSION: 1. Chronically dilated pulmonary trunk. No arterial embolus is seen. 2. Cardiomegaly with venous distention, slight interstitial edema in the lung apices and bases, minimal pleural effusions and body wall anasarca. 3. Mesenteric congestion and minimal ascites in the abdomen and pelvis. 4. Increased bronchiectasis and bronchiolectasis in both lower lobes since 2005 but no active infiltrate or bronchial impactions. 5. 6 mm and 4 mm right lung ground-glass nodules. Recommend follow-up chest CT 3-6 months. If stable, repeat follow-up CT 18-24 months then at 2 year intervals until 5 years of stability is documented. Consider resection if either nodule develops solid components or substantially grows. These guidelines do not apply to cancer patients and immunocompromised patients. 6. CABG with native CAD, aortoiliac atherosclerosis. 7. Pericholecystic fluid and slightly thickened gallbladder wall with stones. This most likely congestive etiology but please correlate clinically to exclude cholecystitis. No other appreciable source for sepsis is seen. 8. 1.9 x 1.6 cm heterogeneous left adrenal nodule not seen in 2005. Further evaluation recommended. 9. Linear and rounded calcifications along the renal collecting systems, most of which are probably renovascular calcifications. There may be a few small caliceal stones. 10. No collecting system contrast  in the delayed phase. This could be on a congestive basis or due to heart failure, but could also be seen with nephrotoxicity. Laboratory and clinical correlation advised. 11. Remaining findings discussed above. Electronically Signed   By: Telford Nab M.D.   On: 08/08/2022 05:31   DG Chest Port 1 View  Result Date: 08/08/2022 CLINICAL DATA:  Respiratory failure EXAM: PORTABLE CHEST 1 VIEW COMPARISON:  2:20 a.m. FINDINGS: Endotracheal tube 4.9 cm above the carina. Nasogastric tube extends into the upper abdomen beyond the margin of the examination. Left subclavian central venous catheter tip noted at the superior cavoatrial junction. Lungs are  clear. No pneumothorax or pleural effusion. Coronary artery bypass grafting has been performed. Cardiac size is within normal limits. Pulmonary vascularity is normal. No acute bone abnormality. IMPRESSION: 1. Support lines and tubes in appropriate position. 2. No active disease. Electronically Signed   By: Fidela Salisbury M.D.   On: 08/08/2022 03:51   CT HEAD WO CONTRAST (5MM)  Result Date: 08/08/2022 CLINICAL DATA:  Altered mental status EXAM: CT HEAD WITHOUT CONTRAST TECHNIQUE: Contiguous axial images were obtained from the base of the skull through the vertex without intravenous contrast. RADIATION DOSE REDUCTION: This exam was performed according to the departmental dose-optimization program which includes automated exposure control, adjustment of the mA and/or kV according to patient size and/or use of iterative reconstruction technique. COMPARISON:  05/11/2022 FINDINGS: Brain: Imaging is limited by motion artifact. Mild parenchymal volume loss is commensurate with the patient's age. Moderate periventricular white matter changes are present likely reflecting the sequela of small vessel ischemia, stable since prior examination. No evidence of acute intracranial hemorrhage or infarct. No abnormal mass effect or midline shift. No abnormal intra or extra-axial mass  lesion. Ventricular size is normal. Cerebellum is unremarkable. Vascular: No hyperdense vessel or unexpected calcification. Skull: Normal. Negative for fracture or focal lesion. Sinuses/Orbits: No acute finding. Other: Mastoid air cells and middle ear cavities are clear. IMPRESSION: Limited examination. Stable senescent changes. No definite acute intracranial hemorrhage or infarct. Electronically Signed   By: Fidela Salisbury M.D.   On: 08/08/2022 03:11   DG Chest Port 1 View  Result Date: 08/08/2022 CLINICAL DATA:  Questionable sepsis. EXAM: PORTABLE CHEST 1 VIEW COMPARISON:  Portable chest 05/10/2022 FINDINGS: Mild cardiomegaly. Old CABG. No vascular congestion is seen. The mitral ring is heavily calcified. There are scattered aortic calcifications with unremarkable mediastinal configuration. The lungs are clear. No pleural effusion is seen. There is thoracic spondylosis. IMPRESSION: No evidence of acute chest process or interval changes. Stable post CABG chest with cardiomegaly. Electronically Signed   By: Telford Nab M.D.   On: 08/08/2022 02:32   Left heart catheterization   Subjective: Patient was seen and examined at bedside.  Overnight events noted. Patient reports doing much better, denies any chest pain. Patient is going to be discharged to SNF today.  Discharge Exam: Vitals:   08/16/22 0734 08/16/22 1217  BP: 119/61 (!) 103/55  Pulse: 97 81  Resp: 16 18  Temp: 98.1 F (36.7 C) 98.1 F (36.7 C)  SpO2: 96% 98%   Vitals:   08/16/22 0358 08/16/22 0523 08/16/22 0734 08/16/22 1217  BP: 124/65  119/61 (!) 103/55  Pulse: 85  97 81  Resp: '16  16 18  '$ Temp: 98.3 F (36.8 C)  98.1 F (36.7 C) 98.1 F (36.7 C)  TempSrc:      SpO2: 98%  96% 98%  Weight:  77.8 kg    Height:        General: Pt is alert, awake, not in acute distress Cardiovascular: RRR, S1/S2 +, no rubs, no gallops Respiratory: CTA bilaterally, no wheezing, no rhonchi Abdominal: Soft, NT, ND, bowel sounds  + Extremities: no edema, no cyanosis    The results of significant diagnostics from this hospitalization (including imaging, microbiology, ancillary and laboratory) are listed below for reference.     Microbiology: Recent Results (from the past 240 hour(s))  Blood Culture (routine x 2)     Status: None   Collection Time: 08/08/22  2:10 AM   Specimen: BLOOD RIGHT ARM  Result Value Ref Range Status  Specimen Description BLOOD RIGHT ARM  Final   Special Requests   Final    BOTTLES DRAWN AEROBIC AND ANAEROBIC Blood Culture adequate volume   Culture   Final    NO GROWTH 5 DAYS Performed at [facility], [address] [place], [place] [address]    Report Status 08/13/2022 FINAL  Final  Blood Culture (routine x 2)     Status: None   Collection Time: 08/08/22  2:10 AM   Specimen: BLOOD  Result Value Ref Range Status   Specimen Description BLOOD BLOOD LEFT ARM  Final   Special Requests   Final    BOTTLES DRAWN AEROBIC AND ANAEROBIC Blood Culture adequate volume   Culture   Final    NO GROWTH 5 DAYS Performed at [facility], [address], [place], [place] [address]    Report Status 08/13/2022 FINAL  Final  Urine Culture     Status: None   Collection Time: 08/08/22  2:10 AM   Specimen: In/Out Cath Urine  Result Value Ref Range Status   Specimen Description   Final    IN/OUT CATH URINE Performed at [facility], [address] [place], [place] [address]    Special Requests   Final    NONE Performed at [facility], [address], [place], [place] [address]    Culture   Final    NO GROWTH Performed at [facility], [place] [address] [place], [place] [address]    Report Status 08/09/2022 FINAL  Final  SARS Coronavirus 2 by RT PCR (hospital order, performed in [facility] hospital lab) *cepheid single result test* Anterior Nasal Swab     Status: None   Collection Time: 08/08/22  5:10 AM   Specimen: Anterior Nasal Swab  Result  Value Ref Range Status   SARS Coronavirus 2 by RT PCR NEGATIVE NEGATIVE Final    Comment: (NOTE) SARS-CoV-2 target nucleic acids are NOT DETECTED.  The SARS-CoV-2 RNA is generally detectable in upper and lower respiratory specimens during the acute phase of infection. The lowest concentration of SARS-CoV-2 viral copies this assay can detect is 250 copies / mL. A negative result does not preclude SARS-CoV-2 infection and should not be used as the sole basis for treatment or other patient management decisions.  A negative result may occur with improper specimen collection / handling, submission of specimen other than nasopharyngeal swab, presence of viral mutation(s) within the areas targeted by this assay, and inadequate number of viral copies (<250 copies / mL). A negative result must be combined with clinical observations, patient history, and epidemiological information.  Fact Sheet for Patients:   https://www.patel.info/  Fact Sheet for Healthcare Providers: https://hall.com/  This test is not yet approved or  cleared by the [place] FDA and has been authorized for detection and/or diagnosis of SARS-CoV-2 by FDA under an Emergency Use Authorization (EUA).  This EUA will remain in effect (meaning this test can be used) for the duration of the COVID-19 declaration under Section 564(b)(1) of the Act, 21 U.S.C. section 360bbb-3(b)(1), unless the authorization is terminated or revoked sooner.  Performed at [facility], [address] [place], [place] [address]   MRSA Next Gen by PCR, Nasal     Status: None   Collection Time: 08/08/22  5:10 AM   Specimen: Anterior Nasal Swab  Result Value Ref Range Status   MRSA by PCR Next Gen NOT DETECTED NOT DETECTED Final    Comment: (NOTE)  The GeneXpert MRSA Assay (FDA approved for NASAL specimens only), is one component of a comprehensive MRSA colonization surveillance program. It  is not intended to diagnose MRSA infection nor to guide or monitor treatment for MRSA infections. Test performance is not FDA approved in patients less than 66 years old. Performed at [facility], [address] [place], [place] [address]   Culture, Respiratory w Gram Stain     Status: None   Collection Time: 08/09/22  7:54 AM   Specimen: Tracheal Aspirate; Respiratory  Result Value Ref Range Status   Specimen Description   Final    TRACHEAL ASPIRATE Performed at [facility], [address] [place], [place] [address]    Special Requests   Final    NONE Performed at [facility], [address] [place], [place] [address]    Gram Stain   Final    FEW WBC PRESENT,BOTH PMN AND MONONUCLEAR RARE GRAM POSITIVE COCCI IN CLUSTERS RARE BUDDING YEAST SEEN Performed at [facility], [place] [address] [place], [place] [address]    Culture FEW CANDIDA TROPICALIS  Final   Report Status 08/11/2022 FINAL  Final  Respiratory (~20 pathogens) panel by PCR     Status: None   Collection Time: 08/09/22  7:54 AM   Specimen: Nasopharyngeal Swab; Respiratory  Result Value Ref Range Status   Adenovirus NOT DETECTED NOT DETECTED Final   Coronavirus 229E NOT DETECTED NOT DETECTED Final    Comment: (NOTE) The Coronavirus on the Respiratory Panel, DOES NOT test for the novel  Coronavirus (2019 nCoV)    Coronavirus HKU1 NOT DETECTED NOT DETECTED Final   Coronavirus NL63 NOT DETECTED NOT DETECTED Final   Coronavirus OC43 NOT DETECTED NOT DETECTED Final   Metapneumovirus NOT DETECTED NOT DETECTED Final   Rhinovirus / Enterovirus NOT DETECTED NOT DETECTED Final   Influenza A NOT DETECTED NOT DETECTED Final   Influenza B NOT DETECTED NOT DETECTED Final   Parainfluenza Virus 1 NOT DETECTED NOT DETECTED Final   Parainfluenza Virus 2 NOT DETECTED NOT DETECTED Final   Parainfluenza Virus 3 NOT DETECTED NOT DETECTED Final   Parainfluenza Virus 4 NOT DETECTED NOT DETECTED  Final   Respiratory Syncytial Virus NOT DETECTED NOT DETECTED Final   Bordetella pertussis NOT DETECTED NOT DETECTED Final   Bordetella Parapertussis NOT DETECTED NOT DETECTED Final   Chlamydophila pneumoniae NOT DETECTED NOT DETECTED Final   Mycoplasma pneumoniae NOT DETECTED NOT DETECTED Final    Comment: Performed at [facility], [place] [place], [place] [address]     Labs: BNP (last 3 results) Recent Labs    08/08/22 0210  BNP 546.5*   Basic Metabolic Panel: Recent Labs  Lab 08/12/22 0533 08/13/22 0500 08/13/22 1528 08/13/22 1536 08/14/22 0639 08/14/22 1350 08/15/22 0500 08/16/22 0457  NA 143 140 139 139 139  --  136 139  K 3.4* 3.3* 4.1 4.1 5.2* 4.8 3.9 3.7  CL 106 104  --   --  104  --  102 104  CO2 28 30  --   --  18*  --  29 28  GLUCOSE 103* 211*  --   --  371*  --  177* 100*  BUN 29* 23  --   --  24*  --  24* 23  CREATININE 1.03* 0.84  --   --  0.99  --  0.97 0.78  CALCIUM 8.3* 8.0*  --   --  8.3*  --  8.1* 8.2*  MG 1.9 1.9  --   --  2.3  --  2.0 2.0  PHOS 2.2* 1.8*  --   --  3.9  --  1.5* 2.2*   Liver Function Tests: Recent Labs  Lab 08/10/22 0420 08/11/22 0420 08/12/22 0533  AST 169* 124* 81*  ALT 81* 68* 55*  ALKPHOS 80 81 79  BILITOT 0.5 0.8 1.1  PROT 5.2* 4.9* 4.8*  ALBUMIN 2.6* 2.4* 2.4*   No results for input(s): "LIPASE", "AMYLASE" in the last 168 hours. No results for input(s): "AMMONIA" in the last 168 hours. CBC: Recent Labs  Lab 08/10/22 0420 08/11/22 0420 08/12/22 0533 08/13/22 1528 08/13/22 1536 08/14/22 0639  WBC 16.8* 8.8 7.8  --   --  7.0  HGB 10.0* 9.1* 8.7* 8.5* 8.5* 9.8*  HCT 30.6* 28.2* 26.3* 25.0* 25.0* 30.5*  MCV 90.5 92.2 92.3  --   --  90.8  PLT 173 131* 137*  --   --  191   Cardiac Enzymes: No results for input(s): "CKTOTAL", "CKMB", "CKMBINDEX", "TROPONINI" in the last 168 hours. BNP: Invalid input(s): "POCBNP" CBG: Recent Labs  Lab 08/15/22 1644 08/15/22 1937 08/15/22 2326 08/16/22 0401  08/16/22 0735  GLUCAP 321* 312* 158* 85 189*   D-Dimer No results for input(s): "DDIMER" in the last 72 hours. Hgb A1c No results for input(s): "HGBA1C" in the last 72 hours. Lipid Profile Recent Labs    08/16/22 0451  CHOL 116  HDL 55  LDLCALC 48  TRIG 64  CHOLHDL 2.1   Thyroid function studies No results for input(s): "TSH", "T4TOTAL", "T3FREE", "THYROIDAB" in the last 72 hours.  Invalid input(s): "FREET3" Anemia work up No results for input(s): "VITAMINB12", "FOLATE", "FERRITIN", "TIBC", "IRON", "RETICCTPCT" in the last 72 hours. Urinalysis    Component Value Date/Time   COLORURINE STRAW (A) 08/08/2022 0210   APPEARANCEUR CLEAR (A) 08/08/2022 0210   LABSPEC 1.021 08/08/2022 0210   PHURINE 5.0 08/08/2022 0210   GLUCOSEU >=500 (A) 08/08/2022 0210   HGBUR NEGATIVE 08/08/2022 0210   BILIRUBINUR NEGATIVE 08/08/2022 0210   KETONESUR 20 (A) 08/08/2022 0210   PROTEINUR NEGATIVE 08/08/2022 0210   NITRITE NEGATIVE 08/08/2022 0210   LEUKOCYTESUR NEGATIVE 08/08/2022 0210   Sepsis Labs Recent Labs  Lab 08/10/22 0420 08/11/22 0420 08/12/22 0533 08/14/22 0639  WBC 16.8* 8.8 7.8 7.0   Microbiology Recent Results (from the past 240 hour(s))  Blood Culture (routine x 2)     Status: None   Collection Time: 08/08/22  2:10 AM   Specimen: BLOOD RIGHT ARM  Result Value Ref Range Status   Specimen Description BLOOD RIGHT ARM  Final   Special Requests   Final    BOTTLES DRAWN AEROBIC AND ANAEROBIC Blood Culture adequate volume   Culture   Final    NO GROWTH 5 DAYS Performed at [facility], [address], [place], [place] [address]    Report Status 08/13/2022 FINAL  Final  Blood Culture (routine x 2)     Status: None   Collection Time: 08/08/22  2:10 AM   Specimen: BLOOD  Result Value Ref Range Status   Specimen Description BLOOD BLOOD LEFT ARM  Final   Special Requests   Final    BOTTLES DRAWN AEROBIC AND ANAEROBIC Blood Culture adequate volume   Culture    Final    NO GROWTH 5 DAYS Performed at [facility], [address] [place], [place] [address]    Report Status 08/13/2022 FINAL  Final  Urine Culture     Status: None   Collection Time: 08/08/22  2:10 AM   Specimen: In/Out Cath Urine  Result Value Ref Range Status   Specimen Description   Final    IN/OUT CATH URINE Performed at [facility], [address], [place], [place] [address]    Special Requests   Final    NONE Performed at [facility], [address], [place], [place] [address]    Culture   Final    NO GROWTH Performed at [facility], [place] [address] [place], [place] [address]    Report Status 08/09/2022 FINAL  Final  SARS Coronavirus 2 by RT PCR (hospital order, performed in [facility] hospital lab) *cepheid single result test* Anterior Nasal Swab     Status: None   Collection Time: 08/08/22  5:10 AM   Specimen: Anterior Nasal Swab  Result Value Ref Range Status   SARS Coronavirus 2 by RT PCR NEGATIVE NEGATIVE Final    Comment: (NOTE) SARS-CoV-2 target nucleic acids are NOT DETECTED.  The SARS-CoV-2 RNA is generally detectable in upper and lower respiratory specimens during the acute phase of infection. The lowest concentration of SARS-CoV-2 viral copies this assay can detect is 250 copies / mL. A negative result does not preclude SARS-CoV-2 infection and should not be used as the sole basis for treatment or other patient management decisions.  A negative result may occur with improper specimen collection / handling, submission of specimen other than nasopharyngeal swab, presence of viral mutation(s) within the areas targeted by this assay, and inadequate number of viral copies (<250 copies / mL). A negative result must be combined with clinical observations, patient history, and epidemiological information.  Fact Sheet for Patients:   https://www.patel.info/  Fact Sheet for Healthcare  Providers: https://hall.com/  This test is not yet approved or  cleared by the [place] FDA and has been authorized for detection and/or diagnosis of SARS-CoV-2 by FDA under an Emergency Use Authorization (EUA).  This EUA will remain in effect (meaning this test can be used) for the duration of the COVID-19 declaration under Section 564(b)(1) of the Act, 21 U.S.C. section 360bbb-3(b)(1), unless the authorization is terminated or revoked sooner.  Performed at [facility], [address] [place], [place] [address]   MRSA Next Gen by PCR, Nasal     Status: None   Collection Time: 08/08/22  5:10 AM   Specimen: Anterior Nasal Swab  Result Value Ref Range Status   MRSA by PCR Next Gen NOT DETECTED NOT DETECTED Final    Comment: (NOTE) The GeneXpert MRSA Assay (FDA approved for NASAL specimens only), is one component of a comprehensive MRSA colonization surveillance program. It is not intended to diagnose MRSA infection nor to guide or monitor treatment for MRSA infections. Test performance is not FDA approved in patients less than 39 years old. Performed at [facility], [address] [place], [place] [address]   Culture, Respiratory w Gram Stain     Status: None   Collection Time: 08/09/22  7:54 AM   Specimen: Tracheal Aspirate; Respiratory  Result Value Ref Range Status   Specimen Description   Final    TRACHEAL ASPIRATE Performed at [facility], [address], [place], [place] [address]    Special Requests   Final    NONE Performed at [facility], [address] [place], [place] [address]    Gram Stain   Final  FEW WBC PRESENT,BOTH PMN AND MONONUCLEAR RARE GRAM POSITIVE COCCI IN CLUSTERS RARE BUDDING YEAST SEEN Performed at [facility], [place] [address] [address], [place], [place] [address]    Culture FEW CANDIDA TROPICALIS  Final   Report Status 08/11/2022 FINAL  Final  Respiratory (~20 pathogens)  panel by PCR     Status: None   Collection Time: 08/09/22  7:54 AM   Specimen: Nasopharyngeal Swab; Respiratory  Result Value Ref Range Status   Adenovirus NOT DETECTED NOT DETECTED Final   Coronavirus 229E NOT DETECTED NOT DETECTED Final    Comment: (NOTE) The Coronavirus on the Respiratory Panel, DOES NOT test for the novel  Coronavirus (2019 nCoV)    Coronavirus HKU1 NOT DETECTED NOT DETECTED Final   Coronavirus NL63 NOT DETECTED NOT DETECTED Final   Coronavirus OC43 NOT DETECTED NOT DETECTED Final   Metapneumovirus NOT DETECTED NOT DETECTED Final   Rhinovirus / Enterovirus NOT DETECTED NOT DETECTED Final   Influenza A NOT DETECTED NOT DETECTED Final   Influenza B NOT DETECTED NOT DETECTED Final   Parainfluenza Virus 1 NOT DETECTED NOT DETECTED Final   Parainfluenza Virus 2 NOT DETECTED NOT DETECTED Final   Parainfluenza Virus 3 NOT DETECTED NOT DETECTED Final   Parainfluenza Virus 4 NOT DETECTED NOT DETECTED Final   Respiratory Syncytial Virus NOT DETECTED NOT DETECTED Final   Bordetella pertussis NOT DETECTED NOT DETECTED Final   Bordetella Parapertussis NOT DETECTED NOT DETECTED Final   Chlamydophila pneumoniae NOT DETECTED NOT DETECTED Final   Mycoplasma pneumoniae NOT DETECTED NOT DETECTED Final    Comment: Performed at [facility], [place]. [address] [place], [place] [address]     Time coordinating discharge: Over 30 minutes  SIGNED:   Shawna Clamp, MD  [facility] 08/16/2022, 12:19 PM Pager   If 7PM-7AM, please contact night-coverage

## 2022-08-16 NOTE — Discharge Instructions (Signed)
Advised to follow-up with primary care physician in 1 week. Advised to follow-up with cardiology as scheduled. Advised to continue following medications. Occasionally Eliquis 5 mg twice daily for atrial fibrillation

## 2022-08-16 NOTE — Care Management Important Message (Signed)
Important Message  Patient Details  Name: Carrie Kane MRN: [id number] Date of Birth: [birth year]   Medicare Important Message Given:  Yes     Dannette Barbara 08/16/2022, 1:15 PM

## 2022-08-16 NOTE — Telephone Encounter (Signed)
-----   Message from Irene, PA-C sent at 08/14/2022  9:42 AM EDT ----- Regarding: hosp follow-up Pt will need hosp follow-up in 2-3 weeks.

## 2022-08-16 NOTE — Progress Notes (Signed)
Physical Therapy Treatment Patient Details Name: Carrie Kane MRN: [id number] DOB: July 25, 1941 Today's Date: 08/16/2022   History of Present Illness 81 y.o female with significant PMH of Left displaced femoral neck fracture, T2DM, CAD with Hx of CABG X 3, PAD s/p revascularization 2022, PSVT, Anemia, Hypothyroidism,hypertension, HLD, CVA (02/2022) who presented to the ED with chief complaints of altered mental status and shortness of breath. On Presentation, she was noted to be hypotensive, have severe metabolic acidosis and hyperglycemia, with a positive beta-hydroxybutarate.  Pt intubated from 08/08/2022 thru 08/10/2022. Pt s/p left heart cath on 08/13/22 (right femoral access)    PT Comments    Pt seen for PT tx with pt agreeable. Pt is able to complete supine>sit without physical assistance but heavy reliance on hospital bed features & extra time. Pt requires up to max assist for STS with max cuing for technique. Pt engages in standing exercises & progresses to ambulating in room with RW & min assist + chair follow for safety; pt demonstrates impaired gait pattern as noted below. Continue to recommend STR upon d/c. Will continue to follow pt acutely to address strengthening, balance, endurance, & gait with LRAD.    Recommendations for follow up therapy are one component of a multi-disciplinary discharge planning process, led by the attending physician.  Recommendations may be updated based on patient status, additional functional criteria and insurance authorization.  Follow Up Recommendations  Skilled nursing-short term rehab (<3 hours/day) Can patient physically be transported by private vehicle: No   Assistance Recommended at Discharge Frequent or constant Supervision/Assistance  Patient can return home with the following Two people to help with walking and/or transfers;A lot of help with bathing/dressing/bathroom;Assistance with cooking/housework;Assist for transportation;Help with stairs  or ramp for entrance   Equipment Recommendations   (TBD in next venue)    Recommendations for Other Services       Precautions / Restrictions Precautions Precautions: Fall Restrictions Weight Bearing Restrictions: No     Mobility  Bed Mobility Overal bed mobility: Needs Assistance Bed Mobility: Supine to Sit     Supine to sit: Supervision, HOB elevated     General bed mobility comments: Pt is able to transition supine>sit with HOB elevated, bed rails, and extra time.    Transfers Overall transfer level: Needs assistance Equipment used: Rolling walker (2 wheels) Transfers: Sit to/from Stand, Bed to chair/wheelchair/BSC Sit to Stand: Mod assist, Max assist   Step pivot transfers: Min assist       General transfer comment: Focused on STS transfer training throughout session with pt performing multiple repetitions. Pt requires cuing for head/hips relationship & lateral leans as well as extra time to scoot to sitting EOB/edge of recliner. PT educates pt on foot width & placement under BOS as well as head placement. Pt requires cuing for anterior weight shifting to initiate sit>stand, as well as cuing to activate hip extensors/glutes to aide in shifting pelvis anteriorly for improved upright posture in standing. Pt requires ongoing education/cuing throughout session.    Ambulation/Gait Ambulation/Gait assistance: Min assist Gait Distance (Feet): 7 Feet Assistive device: Rolling walker (2 wheels) Gait Pattern/deviations: Decreased step length - right, Decreased step length - left, Decreased dorsiflexion - right, Decreased dorsiflexion - left, Decreased stride length, Shuffle Gait velocity: decreased     General Gait Details: Decreased BLE foot clearance, decreased BLE heel strike, decreased BLE dorsiflexion during swing phase, decreased hip/knee flexion during swing phase. Chair follow for safety.   Stairs  Wheelchair Mobility    Modified Rankin  (Stroke Patients Only)       Balance Overall balance assessment: Needs assistance Sitting-balance support: Feet supported, Bilateral upper extremity supported Sitting balance-Leahy Scale: Fair Sitting balance - Comments: supervision static sitting EOB   Standing balance support: Bilateral upper extremity supported, During functional activity Standing balance-Leahy Scale: Poor                              Cognition Arousal/Alertness: Awake/alert Behavior During Therapy: WFL for tasks assessed/performed Overall Cognitive Status: Within Functional Limits for tasks assessed                                          Exercises Other Exercises Other Exercises: Pt performed mini squats in standing with BUE support on RW (5 repetitions) with multimodal cuing for increased glute/hip extensor activation to come to upright standing.    General Comments General comments (skin integrity, edema, etc.): max HR noted 97 bpm      Pertinent Vitals/Pain Pain Assessment Pain Assessment: No/denies pain    Home Living                          Prior Function            PT Goals (current goals can now be found in the care plan section) Acute Rehab PT Goals Patient Stated Goal: get stronger at rehab PT Goal Formulation: With patient Time For Goal Achievement: 08/24/22 Potential to Achieve Goals: Fair Progress towards PT goals: Progressing toward goals    Frequency    Min 2X/week      PT Plan Current plan remains appropriate    Co-evaluation PT/OT/SLP Co-Evaluation/Treatment: Yes            AM-PAC PT "6 Clicks" Mobility   Outcome Measure  Help needed turning from your back to your side while in a flat bed without using bedrails?: A Little Help needed moving from lying on your back to sitting on the side of a flat bed without using bedrails?: A Lot Help needed moving to and from a bed to a chair (including a wheelchair)?: A Lot Help  needed standing up from a chair using your arms (e.g., wheelchair or bedside chair)?: A Lot Help needed to walk in hospital room?: A Lot Help needed climbing 3-5 steps with a railing? : Total 6 Click Score: 12    End of Session Equipment Utilized During Treatment: Gait belt Activity Tolerance: Patient tolerated treatment well;Patient limited by fatigue Patient left: in chair;with chair alarm set;with call bell/phone within reach Nurse Communication: Mobility status PT Visit Diagnosis: Muscle weakness (generalized) (M62.81);Difficulty in walking, not elsewhere classified (R26.2);Unsteadiness on feet (R26.81)     Time: 2683-4196 PT Time Calculation (min) (ACUTE ONLY): 25 min  Charges:  $Therapeutic Activity: 23-37 mins                     Lavone Nian, PT, DPT 08/16/22, 12:48 PM  Waunita Schooner 08/16/2022, 12:45 PM

## 2022-08-16 NOTE — TOC Transition Note (Signed)
Transition of Care [facility]) - CM/SW Discharge Note   Patient Details  Name: Carrie Kane MRN: [id number] Date of Birth: 06-02-1941  [facility] CM/SW Contact:  Carrie Sam, LCSW Phone Number: 08/16/2022, 12:25 PM   Clinical Narrative:     Patient will DC [facility] Anticipated DC date: 08/16/22 Family notified:spouse Transport by: Johnanna Schneiders  Per MD patient ready for DC to [facility]. RN, patient, patient's family, and facility notified of DC. Discharge Summary sent to facility. RN given number for report  [phone]. DC packet on chart. Ambulance transport requested for patient.  CSW signing off.  Pricilla Riffle, LCSW     Final next level of care: Skilled Nursing Facility Barriers to Discharge: No Barriers Identified   Patient Goals and CMS Choice Patient states their goals for this hospitalization and ongoing recovery are:: to go home CMS Medicare.gov Compare Post Acute Care list provided to:: Patient Represenative (must comment) (spouse) Choice offered to / list presented to : Spouse  Discharge Placement              Patient [facility]: [facility] Patient to be transferred to facility by: acems Name of family member notified: spouse Patient and family notified of of transfer: 08/16/22  Discharge Plan and Services                                     Social Determinants of Health ([place]) Interventions     Readmission Risk Interventions    08/13/2022   12:08 PM  Readmission Risk Prevention Plan  Transportation Screening Complete  HRI or Home Care Consult Complete  Palliative Care Screening Not Applicable  Medication Review (RN Care Manager) Complete

## 2022-08-16 NOTE — Progress Notes (Signed)
Rounding Note    Patient Name: Carrie Kane Date of Encounter: 08/16/2022  [place] Cardiologist: Ida Rogue, MD   Subjective   Patient seen on a.m. rounds.  She denies any chest pain, shortness of breath, or palpitations.  She was started on Imdur which has relieved her chest discomfort.  She has not gotten up with physical therapy this morning and is in the recliner on rounds.  Inpatient Medications    Scheduled Meds:  apixaban  5 mg Oral BID   atorvastatin  80 mg Oral Daily   Chlorhexidine Gluconate Cloth  6 each Topical Daily   clopidogrel  75 mg Oral Daily   feeding supplement  237 mL Oral TID BM   insulin aspart  0-9 Units Subcutaneous Q4H   insulin glargine-yfgn  8 Units Subcutaneous Daily   isosorbide mononitrate  15 mg Oral Daily   metoprolol tartrate  12.5 mg Oral BID   multivitamin with minerals  1 tablet Oral Daily   mouth rinse  15 mL Mouth Rinse Q6H   pantoprazole (PROTONIX) IV  40 mg Intravenous QHS   phosphorus  250 mg Oral TID   senna-docusate  1 tablet Oral BID   sodium chloride flush  3 mL Intravenous Q12H   sodium chloride flush  3 mL Intravenous Q12H   Continuous Infusions:  sodium chloride 250 mL (08/14/22 0114)   PRN Meds: sodium chloride, acetaminophen, alum & mag hydroxide-simeth, dextrose, ondansetron (ZOFRAN) IV, mouth rinse, polyethylene glycol, sodium chloride flush   Vital Signs    Vitals:   08/16/22 0358 08/16/22 0523 08/16/22 0734 08/16/22 1217  BP: 124/65  119/61 (!) 103/55  Pulse: 85  97 81  Resp: '16  16 18  '$ Temp: 98.3 F (36.8 C)  98.1 F (36.7 C) 98.1 F (36.7 C)  TempSrc:      SpO2: 98%  96% 98%  Weight:  77.8 kg    Height:        Intake/Output Summary (Last 24 hours) at 08/16/2022 1227 Last data filed at 08/15/2022 1856 Gross per 24 hour  Intake 477 ml  Output 500 ml  Net -23 ml      08/16/2022    5:23 AM 08/14/2022    4:46 AM 08/12/2022    5:00 AM  Last 3 Weights  Weight (lbs) 171 lb 8.3  oz 165 lb 5.5 oz 161 lb 2.5 oz  Weight (kg) 77.8 kg 75 kg 73.1 kg      Telemetry    Sinus rhythm rate of 70-90 with unifocal PVCs- Personally Reviewed  ECG    No new tracings- Personally Reviewed  Physical Exam   GEN: No acute distress.   Neck: No JVD Cardiac: RRR, no murmurs, rubs, or gallops.  Respiratory: Clear to auscultation bilaterally. GI: Soft, nontender, non-distended  MS: No edema; No deformity. Neuro:  Nonfocal  Psych: Normal affect   Labs    High Sensitivity Troponin:   Recent Labs  Lab 08/08/22 0210 08/08/22 0513 08/09/22 0745 08/09/22 1048 08/10/22 0639  TROPONINIHS 3,038* 15,838* >24,000* >24,000* >24,000*     Chemistry Recent Labs  Lab 08/10/22 0420 08/11/22 0420 08/12/22 0533 08/13/22 0500 08/14/22 0639 08/14/22 1350 08/15/22 0500 08/16/22 0457  NA 139 142 143   < > 139  --  136 139  K 4.2 3.5 3.4*   < > 5.2* 4.8 3.9 3.7  CL 100 102 106   < > 104  --  102 104  CO2 31 32 28   < >  18*  --  29 28  GLUCOSE 142* 104* 103*   < > 371*  --  177* 100*  BUN 41* 37* 29*   < > 24*  --  24* 23  CREATININE 2.33* 1.56* 1.03*   < > 0.99  --  0.97 0.78  CALCIUM 8.0* 8.5* 8.3*   < > 8.3*  --  8.1* 8.2*  MG 1.7 1.9 1.9   < > 2.3  --  2.0 2.0  PROT 5.2* 4.9* 4.8*  --   --   --   --   --   ALBUMIN 2.6* 2.4* 2.4*  --   --   --   --   --   AST 169* 124* 81*  --   --   --   --   --   ALT 81* 68* 55*  --   --   --   --   --   ALKPHOS 80 81 79  --   --   --   --   --   BILITOT 0.5 0.8 1.1  --   --   --   --   --   GFRNONAA 21* 34* 55*   < > 58*  --  59* >60  ANIONGAP '8 8 9   '$ < > 17*  --  5 7   < > = values in this interval not displayed.    Lipids  Recent Labs  Lab 08/16/22 0451  CHOL 116  TRIG 64  HDL 55  LDLCALC 48  CHOLHDL 2.1    Hematology Recent Labs  Lab 08/11/22 0420 08/12/22 0533 08/13/22 1528 08/13/22 1536 08/14/22 0639  WBC 8.8 7.8  --   --  7.0  RBC 3.06* 2.85*  --   --  3.36*  HGB 9.1* 8.7* 8.5* 8.5* 9.8*  HCT 28.2* 26.3* 25.0*  25.0* 30.5*  MCV 92.2 92.3  --   --  90.8  MCH 29.7 30.5  --   --  29.2  MCHC 32.3 33.1  --   --  32.1  RDW 14.9 14.5  --   --  14.5  PLT 131* 137*  --   --  191   Thyroid No results for input(s): "TSH", "FREET4" in the last 168 hours.  BNPNo results for input(s): "BNP", "PROBNP" in the last 168 hours.  DDimer No results for input(s): "DDIMER" in the last 168 hours.   Radiology    No results found.  Cardiac Studies   LHC 08/13/2022 Severe disease of the native left coronary artery system with distal Left Main 90%, severe disease in the small AV groove Circumflex with occlusion of the proximal LAD and large dominant OM with minimal competitive flow. Small caliber heavily calcified Right Coronary Artery with proximal 60 to 70% stenosis with explain the level of troponin elevation LVP 163/7 mmHg with Mildly elevated LVEDP 19 to 20 mmHg. Right Heart Cath numbers relatively stable:  RAP mean 4 mmHg, RVP-EDP 38/1-6 mmHg, PAP-mean 35/13-22 mmHg, PCWP mean 16 mmHg. Ao sat 95%, PA sat 69%; Cardiac Output-Index 8.25-4.42 (normal)   Despite significant elevation of troponin, there is no obvious culprit lesion to explain the patient's symptoms.    TTE 08/08/2022  1. Left ventricular ejection fraction, by estimation, is 65 to 70%. The  left ventricle has normal function. The left ventricle has no regional  wall motion abnormalities. There is moderate asymmetric left ventricular  hypertrophy of the septal segment.  Left ventricular diastolic parameters are  consistent with Grade I  diastolic dysfunction (impaired relaxation). Elevated left ventricular  end-diastolic pressure.   2. Right ventricular systolic function is normal. The right ventricular  size is normal. There is mildly elevated pulmonary artery systolic  pressure.   3. Left atrial size was mildly dilated.   4. The mitral valve is normal in structure. Trivial mitral valve  regurgitation. No evidence of mitral stenosis.   5.  Tricuspid valve regurgitation is mild to moderate.   6. The aortic valve is tricuspid. Aortic valve regurgitation is not  visualized. No aortic stenosis is present.   7. Aortic dilatation noted. There is mild dilatation of the ascending  aorta, measuring 37 mm.   8. The inferior vena cava is dilated in size with <50% respiratory  variability, suggesting right atrial pressure of 15 mmHg.   Patient Profile     81 y.o. female with history of CAD status post CABG in 2018, HFpEF, CVA in 01/2022, SVT, NSVT, PAD status post PTA, DM, HTN, and hyperlipidemia who was admitted with acute hypoxic respiratory failure required mechanical ventilation with encephalopathy in the setting of DKA with metabolic derangement undifferentiated shock and sepsis of unclear etiology who is being seen and evaluated for NSTEMI and new onset atrial fibrillation.  Assessment & Plan    Coronary artery disease status post CABG/NSTEMI -In the setting of DKA with respiratory failure requiring mechanical ventilation with sepsis and encephalopathy -She had significant troponin elevation -Echocardiogram revealed normal LV function and no wall motion abnormality -Left heart catheterization on Friday with no targets for revascularization.  RCA 60 to 70% was thought to be the lesion -Medical therapy was recommendation -She is continued on Plavix, Eliquis and lieu of aspirin, Lipitor, metoprolol, and Imdur -She remains chest pain-free today -Continue cardiac monitoring -EKG as needed changes and pain  New onset atrial fibrillation -Currently maintaining sinus rhythm with a rate of 70-90 -Due to her history of SVT she was continued on metoprolol 12.5 mg twice daily -He is also continued on Eliquis for CHA2DS2-VASc score of at least 9  HFpEF -LVEF on last echocardiogram 65-70% -Given 40 mg of Lasix for elevated LVEDP at the time of catheterization. -She appears euvolemic on examination -Continued on metoprolol -Recommend  Lasix as needed on discharge -Daily weight, I&O, low-sodium diet  Acute hypoxic respiratory failure with encephalopathy -In the setting of DKA and metabolic DR arrangement, shock and sepsis -Successfully extubated 10/24 -Currently remaining on room air without difficulty  Hyperlipidemia -LDL 62 -Continue atorvastatin  AKI -Likely ATN in the setting of hypotension requiring vasopressors -Serum creatinine back to 0.78 -Daily BMP -Nephrology has signed off as well     CHA2DS2-VASc Score = 9   This indicates a 12.2% annual risk of stroke. The patient's score is based upon: CHF History: 1 HTN History: 1 Diabetes History: 1 Stroke History: 2 Vascular Disease History: 1 Age Score: 2 Gender Score: 1     For questions or updates, please contact [place] Please consult [email or web address] for contact info under        Signed, Sylas Twombly, NP  08/16/2022, 12:27 PM

## 2022-08-16 NOTE — Inpatient Diabetes Management (Signed)
Inpatient Diabetes Program Recommendations  AACE/ADA: New Consensus Statement on Inpatient Glycemic Control (2015)  Target Ranges:  Prepandial:   less than 140 mg/dL      Peak postprandial:   less than 180 mg/dL (1-2 hours)      Critically ill patients:  140 - 180 mg/dL   Lab Results  Component Value Date   GLUCAP 189 (H) 08/16/2022   HGBA1C 8.8 (H) 05/10/2022    Review of Glycemic Control  Latest Reference Range & Units 08/15/22 08:01 08/15/22 11:18 08/15/22 16:44 08/15/22 19:37 08/15/22 23:26 08/16/22 04:01 08/16/22 07:35  Glucose-Capillary 70 - 99 mg/dL 146 (H) 204 (H) 321 (H) 312 (H) 158 (H) 85 189 (H)  (H): Data is abnormally high  Diabetes history: DM2 Outpatient Diabetes medications: Lantus 20 units QD Humalog 10-12 BID and PRN Current orders for Inpatient glycemic control:  Semglee 8 units QD Novolog 0-9 units Q4H  Inpatient Diabetes Program Recommendations:    Has a diet order and postprandials are elevated.  Please consider:  Novolog 0-9 units TID  Novolog 3 units TID with meals if consumes at least 50%   Will continue to follow while inpatient.  Thank you, Reche Dixon, MSN, [place] Diabetes Coordinator Inpatient Diabetes Program [phone] (team pager from 8a-5p)

## 2022-08-16 NOTE — Telephone Encounter (Signed)
The patient is currently scheduled to follow up with Dr. Rockey Situ on 09/06/22.

## 2022-08-16 NOTE — Progress Notes (Signed)
SLP Follow up Note  Patient Details Name: Carrie Kane MRN: [id number] DOB: 30-Oct-1940  Diet upgraded following cardiac cath. Pt appears to be consuming without any overt s/s of aspiration or dysphagia. ST will sign off at this time.    Rulon Abdalla 08/16/2022, 9:27 AM

## 2022-08-17 ENCOUNTER — Encounter: Payer: Self-pay | Admitting: Cardiology

## 2022-08-17 LAB — LIPOPROTEIN A (LPA): Lipoprotein (a): 48.9 nmol/L — ABNORMAL HIGH (ref <75.0)

## 2022-08-18 ENCOUNTER — Emergency Department: Payer: Medicare Other

## 2022-08-18 ENCOUNTER — Other Ambulatory Visit: Payer: Self-pay

## 2022-08-18 DIAGNOSIS — D72829 Elevated white blood cell count, unspecified: Secondary | ICD-10-CM | POA: Diagnosis not present

## 2022-08-18 DIAGNOSIS — I469 Cardiac arrest, cause unspecified: Secondary | ICD-10-CM

## 2022-08-18 DIAGNOSIS — I25110 Atherosclerotic heart disease of native coronary artery with unstable angina pectoris: Secondary | ICD-10-CM | POA: Diagnosis present

## 2022-08-18 DIAGNOSIS — Z8673 Personal history of transient ischemic attack (TIA), and cerebral infarction without residual deficits: Secondary | ICD-10-CM

## 2022-08-18 DIAGNOSIS — I071 Rheumatic tricuspid insufficiency: Secondary | ICD-10-CM | POA: Diagnosis present

## 2022-08-18 DIAGNOSIS — E875 Hyperkalemia: Secondary | ICD-10-CM | POA: Diagnosis present

## 2022-08-18 DIAGNOSIS — Z888 Allergy status to other drugs, medicaments and biological substances status: Secondary | ICD-10-CM

## 2022-08-18 DIAGNOSIS — E119 Type 2 diabetes mellitus without complications: Secondary | ICD-10-CM

## 2022-08-18 DIAGNOSIS — E11649 Type 2 diabetes mellitus with hypoglycemia without coma: Secondary | ICD-10-CM

## 2022-08-18 DIAGNOSIS — R079 Chest pain, unspecified: Secondary | ICD-10-CM | POA: Diagnosis present

## 2022-08-18 DIAGNOSIS — I214 Non-ST elevation (NSTEMI) myocardial infarction: Secondary | ICD-10-CM | POA: Diagnosis present

## 2022-08-18 DIAGNOSIS — N179 Acute kidney failure, unspecified: Secondary | ICD-10-CM | POA: Diagnosis present

## 2022-08-18 DIAGNOSIS — I200 Unstable angina: Secondary | ICD-10-CM | POA: Diagnosis present

## 2022-08-18 DIAGNOSIS — N390 Urinary tract infection, site not specified: Secondary | ICD-10-CM | POA: Diagnosis not present

## 2022-08-18 DIAGNOSIS — Z794 Long term (current) use of insulin: Secondary | ICD-10-CM

## 2022-08-18 DIAGNOSIS — Z6825 Body mass index (BMI) 25.0-25.9, adult: Secondary | ICD-10-CM

## 2022-08-18 DIAGNOSIS — I5032 Chronic diastolic (congestive) heart failure: Secondary | ICD-10-CM | POA: Diagnosis present

## 2022-08-18 DIAGNOSIS — Z803 Family history of malignant neoplasm of breast: Secondary | ICD-10-CM

## 2022-08-18 DIAGNOSIS — I480 Paroxysmal atrial fibrillation: Secondary | ICD-10-CM | POA: Diagnosis present

## 2022-08-18 DIAGNOSIS — I959 Hypotension, unspecified: Secondary | ICD-10-CM | POA: Diagnosis present

## 2022-08-18 DIAGNOSIS — I4510 Unspecified right bundle-branch block: Secondary | ICD-10-CM | POA: Diagnosis present

## 2022-08-18 DIAGNOSIS — L89313 Pressure ulcer of right buttock, stage 3: Secondary | ICD-10-CM | POA: Diagnosis present

## 2022-08-18 DIAGNOSIS — I452 Bifascicular block: Secondary | ICD-10-CM | POA: Diagnosis present

## 2022-08-18 DIAGNOSIS — R32 Unspecified urinary incontinence: Secondary | ICD-10-CM | POA: Diagnosis not present

## 2022-08-18 DIAGNOSIS — D638 Anemia in other chronic diseases classified elsewhere: Secondary | ICD-10-CM | POA: Diagnosis present

## 2022-08-18 DIAGNOSIS — I471 Supraventricular tachycardia, unspecified: Secondary | ICD-10-CM | POA: Diagnosis present

## 2022-08-18 DIAGNOSIS — Z955 Presence of coronary angioplasty implant and graft: Secondary | ICD-10-CM

## 2022-08-18 DIAGNOSIS — E663 Overweight: Secondary | ICD-10-CM | POA: Diagnosis present

## 2022-08-18 DIAGNOSIS — I5021 Acute systolic (congestive) heart failure: Secondary | ICD-10-CM | POA: Diagnosis not present

## 2022-08-18 DIAGNOSIS — Z91013 Allergy to seafood: Secondary | ICD-10-CM

## 2022-08-18 DIAGNOSIS — I429 Cardiomyopathy, unspecified: Secondary | ICD-10-CM | POA: Diagnosis present

## 2022-08-18 DIAGNOSIS — E782 Mixed hyperlipidemia: Secondary | ICD-10-CM | POA: Diagnosis present

## 2022-08-18 DIAGNOSIS — E1151 Type 2 diabetes mellitus with diabetic peripheral angiopathy without gangrene: Secondary | ICD-10-CM | POA: Diagnosis present

## 2022-08-18 DIAGNOSIS — E876 Hypokalemia: Secondary | ICD-10-CM | POA: Insufficient documentation

## 2022-08-18 DIAGNOSIS — E1165 Type 2 diabetes mellitus with hyperglycemia: Secondary | ICD-10-CM | POA: Diagnosis present

## 2022-08-18 DIAGNOSIS — I110 Hypertensive heart disease with heart failure: Secondary | ICD-10-CM | POA: Diagnosis present

## 2022-08-18 DIAGNOSIS — E871 Hypo-osmolality and hyponatremia: Secondary | ICD-10-CM | POA: Insufficient documentation

## 2022-08-18 DIAGNOSIS — E440 Moderate protein-calorie malnutrition: Secondary | ICD-10-CM | POA: Diagnosis present

## 2022-08-18 DIAGNOSIS — L8990 Pressure ulcer of unspecified site, unspecified stage: Secondary | ICD-10-CM | POA: Diagnosis present

## 2022-08-18 DIAGNOSIS — I5031 Acute diastolic (congestive) heart failure: Secondary | ICD-10-CM | POA: Diagnosis not present

## 2022-08-18 DIAGNOSIS — Z79899 Other long term (current) drug therapy: Secondary | ICD-10-CM

## 2022-08-18 DIAGNOSIS — I5043 Acute on chronic combined systolic (congestive) and diastolic (congestive) heart failure: Secondary | ICD-10-CM | POA: Diagnosis not present

## 2022-08-18 DIAGNOSIS — Z96642 Presence of left artificial hip joint: Secondary | ICD-10-CM | POA: Diagnosis present

## 2022-08-18 DIAGNOSIS — Z951 Presence of aortocoronary bypass graft: Secondary | ICD-10-CM

## 2022-08-18 DIAGNOSIS — Z7901 Long term (current) use of anticoagulants: Secondary | ICD-10-CM

## 2022-08-18 DIAGNOSIS — D509 Iron deficiency anemia, unspecified: Secondary | ICD-10-CM | POA: Diagnosis present

## 2022-08-18 DIAGNOSIS — Z7902 Long term (current) use of antithrombotics/antiplatelets: Secondary | ICD-10-CM

## 2022-08-18 DIAGNOSIS — Z79890 Hormone replacement therapy (postmenopausal): Secondary | ICD-10-CM

## 2022-08-18 DIAGNOSIS — R680 Hypothermia, not associated with low environmental temperature: Secondary | ICD-10-CM | POA: Diagnosis not present

## 2022-08-18 DIAGNOSIS — E039 Hypothyroidism, unspecified: Secondary | ICD-10-CM | POA: Diagnosis present

## 2022-08-18 DIAGNOSIS — Z86711 Personal history of pulmonary embolism: Secondary | ICD-10-CM

## 2022-08-18 DIAGNOSIS — Z90710 Acquired absence of both cervix and uterus: Secondary | ICD-10-CM

## 2022-08-18 LAB — CBG MONITORING, ED: Glucose-Capillary: 438 mg/dL — ABNORMAL HIGH (ref 70–99)

## 2022-08-18 LAB — PROTIME-INR
INR: 1.3 — ABNORMAL HIGH (ref 0.8–1.2)
Prothrombin Time: 15.6 seconds — ABNORMAL HIGH (ref 11.4–15.2)

## 2022-08-18 LAB — CBC
HCT: 32.9 % — ABNORMAL LOW (ref 36.0–46.0)
Hemoglobin: 10.6 g/dL — ABNORMAL LOW (ref 12.0–15.0)
MCH: 30.1 pg (ref 26.0–34.0)
MCHC: 32.2 g/dL (ref 30.0–36.0)
MCV: 93.5 fL (ref 80.0–100.0)
Platelets: 349 10*3/uL (ref 150–400)
RBC: 3.52 MIL/uL — ABNORMAL LOW (ref 3.87–5.11)
RDW: 16.1 % — ABNORMAL HIGH (ref 11.5–15.5)
WBC: 9.5 10*3/uL (ref 4.0–10.5)
nRBC: 0 % (ref 0.0–0.2)

## 2022-08-18 LAB — BASIC METABOLIC PANEL
Anion gap: 12 (ref 5–15)
BUN: 22 mg/dL (ref 8–23)
CO2: 22 mmol/L (ref 22–32)
Calcium: 8.6 mg/dL — ABNORMAL LOW (ref 8.9–10.3)
Chloride: 96 mmol/L — ABNORMAL LOW (ref 98–111)
Creatinine, Ser: 1.07 mg/dL — ABNORMAL HIGH (ref 0.44–1.00)
GFR, Estimated: 53 mL/min — ABNORMAL LOW (ref >60)
Glucose, Bld: 438 mg/dL — ABNORMAL HIGH (ref 70–99)
Potassium: 4.4 mmol/L (ref 3.5–5.1)
Sodium: 130 mmol/L — ABNORMAL LOW (ref 135–145)

## 2022-08-18 LAB — TROPONIN I (HIGH SENSITIVITY)
Troponin I (High Sensitivity): 3639 ng/L (ref <18)
Troponin I (High Sensitivity): 2308 ng/L (ref <18)

## 2022-08-18 LAB — TSH: TSH: 6.187 u[IU]/mL — ABNORMAL HIGH (ref 0.350–4.500)

## 2022-08-18 LAB — HEPARIN LEVEL (UNFRACTIONATED): Heparin Unfractionated: >1.1 IU/mL — ABNORMAL HIGH (ref 0.30–0.70)

## 2022-08-18 LAB — APTT: aPTT: 32 seconds (ref 24–36)

## 2022-08-18 LAB — T4, FREE: Free T4: 1.18 ng/dL — ABNORMAL HIGH (ref 0.61–1.12)

## 2022-08-18 MED ORDER — CLOPIDOGREL BISULFATE 75 MG PO TABS
75.0000 mg | ORAL_TABLET | Freq: Every day | ORAL | Status: DC
  Administered 2022-08-18 – 2022-08-22 (×5): 75 mg via ORAL
  Filled 2022-08-18 (×5): qty 1

## 2022-08-18 MED ORDER — ATORVASTATIN CALCIUM 20 MG PO TABS
40.0000 mg | ORAL_TABLET | Freq: Every day | ORAL | Status: DC
  Administered 2022-08-18 – 2022-08-22 (×5): 40 mg via ORAL
  Filled 2022-08-18 (×5): qty 2

## 2022-08-18 MED ORDER — INSULIN GLARGINE-YFGN 100 UNIT/ML ~~LOC~~ SOLN
20.0000 [IU] | Freq: Every day | SUBCUTANEOUS | Status: DC
  Administered 2022-08-18 – 2022-08-20 (×2): 20 [IU] via SUBCUTANEOUS
  Filled 2022-08-18 (×6): qty 0.2

## 2022-08-18 MED ORDER — INSULIN ASPART 100 UNIT/ML IJ SOLN
0.0000 [IU] | Freq: Three times a day (TID) | INTRAMUSCULAR | Status: DC
  Administered 2022-08-19: 2 [IU] via SUBCUTANEOUS
  Administered 2022-08-19: 8 [IU] via SUBCUTANEOUS
  Administered 2022-08-20: 5 [IU] via SUBCUTANEOUS
  Administered 2022-08-20: 8 [IU] via SUBCUTANEOUS
  Administered 2022-08-20: 3 [IU] via SUBCUTANEOUS
  Filled 2022-08-18 (×5): qty 1

## 2022-08-18 MED ORDER — ADULT MULTIVITAMIN W/MINERALS CH
1.0000 | ORAL_TABLET | Freq: Every day | ORAL | Status: DC
  Administered 2022-08-19 – 2022-08-22 (×4): 1 via ORAL
  Filled 2022-08-18 (×4): qty 1

## 2022-08-18 MED ORDER — VITAMIN D 25 MCG (1000 UNIT) PO TABS
2000.0000 [IU] | ORAL_TABLET | Freq: Every day | ORAL | Status: DC
  Administered 2022-08-19 – 2022-08-22 (×4): 2000 [IU] via ORAL
  Filled 2022-08-18 (×4): qty 2

## 2022-08-18 MED ORDER — NITROGLYCERIN 2 % TD OINT
0.5000 [in_us] | TOPICAL_OINTMENT | Freq: Once | TRANSDERMAL | Status: AC
  Administered 2022-08-18: 0.5 [in_us] via TOPICAL
  Filled 2022-08-18: qty 1

## 2022-08-18 MED ORDER — NITROGLYCERIN 0.4 MG SL SUBL
0.4000 mg | SUBLINGUAL_TABLET | SUBLINGUAL | Status: DC | PRN
  Administered 2022-08-22: 0.4 mg via SUBLINGUAL
  Filled 2022-08-18: qty 1

## 2022-08-18 MED ORDER — LEVOTHYROXINE SODIUM 100 MCG PO TABS
100.0000 ug | ORAL_TABLET | Freq: Every day | ORAL | Status: DC
  Administered 2022-08-19 – 2022-08-22 (×4): 100 ug via ORAL
  Filled 2022-08-18 (×4): qty 1

## 2022-08-18 MED ORDER — FERROUS SULFATE 325 (65 FE) MG PO TABS
325.0000 mg | ORAL_TABLET | Freq: Every day | ORAL | Status: DC
  Administered 2022-08-18 – 2022-08-22 (×5): 325 mg via ORAL
  Filled 2022-08-18 (×5): qty 1

## 2022-08-18 MED ORDER — MORPHINE SULFATE (PF) 2 MG/ML IV SOLN
2.0000 mg | Freq: Once | INTRAVENOUS | Status: AC
  Administered 2022-08-18: 2 mg via INTRAVENOUS
  Filled 2022-08-18: qty 1

## 2022-08-18 MED ORDER — SODIUM CHLORIDE 0.9 % IV BOLUS
250.0000 mL | Freq: Once | INTRAVENOUS | Status: AC
  Administered 2022-08-18: 250 mL via INTRAVENOUS

## 2022-08-18 MED ORDER — ONDANSETRON HCL 4 MG/2ML IJ SOLN
4.0000 mg | Freq: Four times a day (QID) | INTRAMUSCULAR | Status: DC | PRN
  Administered 2022-08-22 (×2): 4 mg via INTRAVENOUS
  Filled 2022-08-18 (×2): qty 2

## 2022-08-18 MED ORDER — HEPARIN (PORCINE) 25000 UT/250ML-% IV SOLN
12.0000 [IU]/kg/h | INTRAVENOUS | Status: DC
  Administered 2022-08-18: 12 [IU]/kg/h via INTRAVENOUS
  Filled 2022-08-18: qty 250

## 2022-08-18 MED ORDER — ISOSORBIDE MONONITRATE ER 30 MG PO TB24
15.0000 mg | ORAL_TABLET | Freq: Every day | ORAL | Status: DC
  Administered 2022-08-18 – 2022-08-21 (×3): 15 mg via ORAL
  Filled 2022-08-18 (×5): qty 1

## 2022-08-18 MED ORDER — POLYVINYL ALCOHOL 1.4 % OP SOLN: 1.0000 [drp] | OPHTHALMIC | Status: DC | PRN

## 2022-08-18 MED ORDER — METOPROLOL TARTRATE 25 MG PO TABS
12.5000 mg | ORAL_TABLET | Freq: Two times a day (BID) | ORAL | Status: DC
  Administered 2022-08-20 – 2022-08-22 (×4): 12.5 mg via ORAL
  Filled 2022-08-18 (×6): qty 1

## 2022-08-18 MED ORDER — ACETAMINOPHEN 325 MG PO TABS
650.0000 mg | ORAL_TABLET | ORAL | Status: DC | PRN
  Administered 2022-08-18 – 2022-08-19 (×3): 650 mg via ORAL
  Filled 2022-08-18 (×3): qty 2

## 2022-08-18 MED ORDER — INSULIN ASPART 100 UNIT/ML IJ SOLN
0.0000 [IU] | Freq: Every day | INTRAMUSCULAR | Status: DC
  Administered 2022-08-18: 5 [IU] via SUBCUTANEOUS
  Filled 2022-08-18: qty 1

## 2022-08-18 MED ORDER — HEPARIN SODIUM (PORCINE) 5000 UNIT/ML IJ SOLN
4000.0000 [IU] | Freq: Once | INTRAMUSCULAR | Status: AC
  Administered 2022-08-18: 4000 [IU] via INTRAVENOUS

## 2022-08-18 MED ORDER — ASPIRIN 325 MG PO TBEC
325.0000 mg | DELAYED_RELEASE_TABLET | Freq: Every day | ORAL | Status: DC
  Administered 2022-08-19: 325 mg via ORAL
  Filled 2022-08-18: qty 1

## 2022-08-18 NOTE — ED Provider Notes (Addendum)
[facility] Provider Note    Event Date/Time   First MD Initiated Contact with Patient 08/22/2022 1808     (approximate)   History   Chest Pain   HPI  Carrie Kane is a 81 y.o. female comes in complaining of midsternal chest pain.  She had a cath done that showed multiple blockages and was only good for medical management.  She also had a recent PE reportedly.  She was DC'd this Monday.  She feels like someone's standing on her chest.  It started while she was watching TV.  Is been continuous.  She has not had any relief with nitro.  Does not seem pleuritic.  Is also short of breath.  Her troponin has come back 3639. Review of her old records shows her troponin on the 23rd was greater than 24,000.     Physical Exam   Triage Vital Signs: ED Triage Vitals  Enc Vitals Group     BP 08/27/2022 1731 117/61     Pulse Rate 08/25/2022 1753 (!) 107     Resp 08/24/2022 1731 17     Temp 08/20/2022 1731 97.8 F (36.6 C)     Temp Source 09/11/2022 1731 Oral     SpO2 09/06/2022 1753 99 %     Weight --      Height --      Head Circumference --      Peak Flow --      Pain Score --      Pain Loc --      Pain Edu? --      Excl. in [place]? --     Most recent vital signs: Vitals:   09/05/2022 2004 09/15/2022 2020  BP:  (!) 99/58  Pulse:  (!) 105  Resp: 19 20  Temp:    SpO2:  100%     General: Awake, alert complaining of chest pain CV:  Good peripheral perfusion.  Regular rate and rhythm no audible murmurs Resp:  Normal effort.  Clear Abd:  No distention.  Soft and nontender    ED Results / Procedures / Treatments   Labs (all labs ordered are listed, but only abnormal results are displayed) Labs Reviewed  BASIC METABOLIC PANEL - Abnormal; Notable for the following components:      Result Value   Sodium 130 (*)    Chloride 96 (*)    Glucose, Bld 438 (*)    Creatinine, Ser 1.07 (*)    Calcium 8.6 (*)    GFR, Estimated 53 (*)    All other components within normal  limits  CBC - Abnormal; Notable for the following components:   RBC 3.52 (*)    Hemoglobin 10.6 (*)    HCT 32.9 (*)    RDW 16.1 (*)    All other components within normal limits  PROTIME-INR - Abnormal; Notable for the following components:   Prothrombin Time 15.6 (*)    INR 1.3 (*)    All other components within normal limits  HEPARIN LEVEL (UNFRACTIONATED) - Abnormal; Notable for the following components:   Heparin Unfractionated >1.10 (*)    All other components within normal limits  TSH - Abnormal; Notable for the following components:   TSH 6.187 (*)    All other components within normal limits  T4, FREE - Abnormal; Notable for the following components:   Free T4 1.18 (*)    All other components within normal limits  TROPONIN I (HIGH SENSITIVITY) - Abnormal; Notable  for the following components:   Troponin I (High Sensitivity) 3,639 (*)    All other components within normal limits  TROPONIN I (HIGH SENSITIVITY) - Abnormal; Notable for the following components:   Troponin I (High Sensitivity) 2,308 (*)    All other components within normal limits  APTT  [facility] ([facility] ONLY)  BASIC METABOLIC PANEL  CBC  HEPARIN LEVEL (UNFRACTIONATED)  APTT  HEPARIN LEVEL (UNFRACTIONATED)     EKG  EKG read and interpreted by me shows sinus tachycardia 106 left axis right bundle branch block left anterior hemiblock QRS duration is 148 ms EKG #2 read and interpreted by me shows sinus tachycardia rate of 110 right bundle branch block left anterior hemiblock similar to prior.  There are new flipped T's laterally in 1 and L on this 1 in the prior EKG compared to old EKGs but no other marked changes.  QRS duration is 163 ms.   RADIOLOGY Chest x-ray read by radiology reviewed by me shows no acute disease  PROCEDURES:  Critical Care performed: Critical care time 20 minutes.  This includes seeing the patient evaluating her and then ordering her studies discussing her with the cardiologist and the  hospital doc.  Procedures   MEDICATIONS ORDERED IN ED: Medications  heparin ADULT infusion 100 units/mL (25000 units/271m) (12 Units/kg/hr  77.8 kg Intravenous New Bag/Given 09/01/2022 1844)  atorvastatin (LIPITOR) tablet 40 mg (40 mg Oral Given 09/07/2022 2035)  isosorbide mononitrate (IMDUR) 24 hr tablet 15 mg (15 mg Oral Given 08/28/2022 2034)  metoprolol tartrate (LOPRESSOR) tablet 12.5 mg (has no administration in time range)  nitroGLYCERIN (NITROSTAT) SL tablet 0.4 mg (has no administration in time range)  insulin glargine-yfgn (SEMGLEE) injection 20 Units (has no administration in time range)  levothyroxine (SYNTHROID) tablet 100 mcg (has no administration in time range)  clopidogrel (PLAVIX) tablet 75 mg (75 mg Oral Given 09/09/2022 2035)  ferrous sulfate tablet 325 mg (has no administration in time range)  cholecalciferol (VITAMIN D3) 25 MCG (1000 UNIT) tablet 2,000 Units (has no administration in time range)  multivitamin with minerals tablet 1 tablet (has no administration in time range)  polyvinyl alcohol (LIQUIFILM TEARS) 1.4 % ophthalmic solution 1 drop (has no administration in time range)  aspirin EC tablet 325 mg (has no administration in time range)  acetaminophen (TYLENOL) tablet 650 mg (has no administration in time range)  ondansetron (ZOFRAN) injection 4 mg (has no administration in time range)  insulin aspart (novoLOG) injection 0-15 Units (has no administration in time range)  insulin aspart (novoLOG) injection 0-5 Units (has no administration in time range)  nitroGLYCERIN (NITROGLYN) 2 % ointment 0.5 inch (0.5 inches Topical Given 08/30/2022 1836)  morphine (PF) 2 MG/ML injection 2 mg (2 mg Intravenous Given 09/02/2022 1823)  heparin injection 4,000 Units (4,000 Units Intravenous Given 09/09/2022 1847)  sodium chloride 0.9 % bolus 250 mL (0 mLs Intravenous Stopped 08/27/2022 1917)     IMPRESSION / MDM / ASSESSMENT AND PLAN / ED COURSE  I reviewed the triage vital signs and the  nursing notes. Patient tells me last week when she had the MI with the troponin with a level of greater than 24,000 she had gone to bed and feel felt bad he vomited and her husband called the ambulance and she was unconscious for about 2 days.  She was diagnosed with having an MI and STEMI and PE.  Differential diagnosis includes, but is not limited to, NSTEMI, chest pain related to her previous NSTEMI and elevated  troponin also related to that.  PE is a possibility although the patient's pain is not pleuritic.  Other causes of chest pain like esophageal spasm are also possible  Patient's presentation is most consistent with acute presentation with potential threat to life or bodily function.  The patient is on the cardiac monitor to evaluate for evidence of arrhythmia and/or significant heart rate changes.  None have been seen  We attempted to try quarter inch of Nitropaste for the patient to see if it would help.  With understanding by this was applied blood pressure immediately dropped to 94th nitro was removed the pressure continued dropping into the upper 80s.  We are giving fluids now to bring the pressure up when the pressure comes up if the patient feels a little bit better we will give her 2 more morphine and try to CT her chest.  This is to see if she has a PE or not.  I have paged cardiologist twice at this point.   FINAL CLINICAL IMPRESSION(S) / ED DIAGNOSES   Final diagnoses:  Unstable angina (HCC)  Possible NSTEMI depending on whether or not the troponin elevation is left over from her greater than 24,000 troponin level on the 23rd of this month or if it is new.  CK still pending.   Rx / DC Orders   ED Discharge Orders     None        Note:  This document was prepared using Dragon voice recognition software and may include unintentional dictation errors.   Nena Polio, MD 09/03/2022 2124    Nena Polio, MD 08/18/2022 2124

## 2022-08-18 NOTE — ED Notes (Signed)
Nitro paste removed at this time due to low BP. Cinda Quest, MD aware.

## 2022-08-18 NOTE — ED Notes (Signed)
Hospitalist at the bedside 

## 2022-08-18 NOTE — ED Notes (Signed)
Cinda Quest, MD aware of critical troponin

## 2022-08-18 NOTE — H&P (Signed)
[facility] [place] @ [facility] Admission History and Physical [employer], D.O.  ---------------------------------------------------------------------------------------------------------------------   PATIENT NAME: Carrie Kane MR#: [id number] DATE OF BIRTH: [birth year] DATE OF ADMISSION: 09/15/2022 PRIMARY CARE PHYSICIAN: Cletis Athens, MD  REQUESTING/REFERRING PHYSICIAN: ED Dr. Cinda Quest  CHIEF COMPLAINT: Chief Complaint  Patient presents with   Chest Pain    HISTORY OF PRESENT ILLNESS: Carrie Kane is a 81 y.o. 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  She rec'd ASA 325 and 2 nitro by EMS without relief. Patient denies fevers/chills, weakness, dizziness, shortness of breath, N/V/C/D, abdominal pain, dysuria/frequency, changes in mental status.    Of note patient was admitted to ICU for DKA on 08/08/22: from D/C summ: presented to the ED with chief complaints of altered mental status and shortness of breath. She was noted to be hypotensive, have severe metabolic acidosis and hyperglycemia.  He was admitted in the ICU for DKA and managed as per protocol.  She was also found to have elevated troponin , Cardiology was consulted, recommended left heart cath once renal functions improved.  She is off pressor support.  CT shows small PE.  She was intubated and eventually successfully extubated. TRH pickup 08/11/2022.  Renal functions has improved.  Then patient underwent left heart catheterization.  Left heart catheterization shows severe disease but all the grafts were patent and open.  Patient was started on Eliquis for new onset atrial fibrillation.  She feels better,  PT and OT recommended skilled nursing facility. Cardiology signed off.  Patient is being discharged to skilled  nursing facility for rehab.    EMS/ED COURSE:   Patient received ASA and nitro by EMS.  One quarter inch of nitropaste in ER caused hypotension and was therefore d/c'd.  Also started on heparin  PAST MEDICAL HISTORY: Past Medical History:  Diagnosis Date   CAD (coronary artery disease)    a. 04/2017 Cath: LM 4, LAD 80p, 8m LCX 95ost, 713mEF 45-50%; b. 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   Diastolic dysfunction    a. 04/2017 Echo: EF 55-60%, no rwma, Gr1 DD, mildly dil LA; b. 08/2020 Echo: EF 55-60%, no rwma, GrII DD, nl RV fxn, RVSP 3015m, mod dil LA, mildly dil RA, Mod TR.   Hyperlipidemia    Hypertensive heart disease    Insulin dependent diabetes mellitus    [place] disease) [place]  a. 02/2021 PTA/DCBA R Peroneal, R Popliteal, distal R SFA.   PSVT (paroxysmal supraventricular tachycardia)    a. 02/2022 [place]: Predominantly sinus rhythm @ 61 (36-218). 2 NSVT runs (fastest/longest 6 beats @ 218). 37 SVT/A tach runs (fastest 185 x 5 beats, longest 32.4 secs @ 107). Triggered events = RSR, PAC.   [facility]  [facility]. 01/2022 R sided wkns/aphasia/tremor/slurred speech-->Ss resolved; b. 02/2022 MRI/A: punctate subacute inf vs artifact-post limb of L int capsule. 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 Mod, chronic small vessel isch changes within the cerebral white matter.  Subcm chronic infarct- L cerebellar hemisphere.  Sev dzs prox P2 seg of R PCA. Mod dzs A1 R antClinical cytogeneticist    PAST SURGICAL HISTORY: Past Surgical History:  Procedure Laterality Date   ABDOMINAL HYSTERECTOMY     APPLICATION OF WOUND VAC  05/11/2022  Procedure: APPLICATION OF WOUND VAC;  Surgeon: Renee Harder, MD;  Location: [facility];  Service: Orthopedics;;   CARDIAC CATHETERIZATION     CATARACT EXTRACTION W/PHACO Right 10/26/2021   Procedure: CATARACT EXTRACTION PHACO AND INTRAOCULAR LENS PLACEMENT (I[facility]) RIGHT DIABETIC 16.27 01:23.8;  Surgeon: Eulogio Bear, MD;  Location: [place];  Service: Ophthalmology;  Laterality: Right;  Please leave arrival at 8:00   CATARACT EXTRACTION W/PHACO Left 11/09/2021   Procedure: CATARACT EXTRACTION PHACO AND INTRAOCULAR LENS PLACEMENT ([place]) LEFT DIABETIC;  Surgeon: Eulogio Bear, MD;  Location: [place];  Service: Ophthalmology;  Laterality: Left;  13.39 01:13.0   CORONARY ARTERY BYPASS GRAFT N/A 05/09/2017   Procedure: CORONARY ARTERY BYPASS GRAFTING (CABG) x 3 using left internal mammary artery and right greater saphenous vein harvested endoscopically;  Surgeon: Ivin Poot, MD;  Location: [place];  Service: Open Heart Surgery;  Laterality: N/A;   EYE SURGERY     HIP ARTHROPLASTY Left 05/11/2022   Procedure: ARTHROPLASTY BIPOLAR HIP (HEMIARTHROPLASTY);  Surgeon: Renee Harder, MD;  Location: [facility];  Service: Orthopedics;  Laterality: Left;   INTRAOPERATIVE TRANSESOPHAGEAL ECHOCARDIOGRAM N/A 05/09/2017   Procedure: INTRAOPERATIVE TRANSESOPHAGEAL ECHOCARDIOGRAM;  Surgeon: Ivin Poot, MD;  Location: [place];  Service: Open Heart Surgery;  Laterality: N/A;   IR RADIOLOGIST EVAL & MGMT  12/04/2020   LEFT HEART CATH AND CORONARY ANGIOGRAPHY N/A 05/02/2017   Procedure: Left Heart Cath and Coronary Angiography;  Surgeon: Wellington Hampshire, MD;  Location: [place] [facility];  Service: Cardiovascular;  Laterality: N/A;   LEFT HEART CATH AND CORONARY ANGIOGRAPHY N/A 08/13/2022   Procedure: LEFT HEART CATH AND CORONARY ANGIOGRAPHY;  Surgeon: Leonie Man, MD;  Location: [place] [facility];  Service: Cardiovascular;  Laterality: N/A;   LOWER EXTREMITY ANGIOGRAPHY Left 02/09/2021   Procedure: LOWER EXTREMITY ANGIOGRAPHY;  Surgeon: Algernon Huxley, MD;  Location: [place] [facility];  Service: Cardiovascular;  Laterality: Left;   LOWER EXTREMITY ANGIOGRAPHY Right 02/16/2021   Procedure: LOWER EXTREMITY ANGIOGRAPHY;  Surgeon: Algernon Huxley, MD;  Location: [place]  CV [facility];  Service: Cardiovascular;  Laterality: Right;      SOCIAL HISTORY: Social History   Tobacco Use   Smoking status: Never   Smokeless tobacco: Never  Substance Use Topics   Alcohol use: No      FAMILY HISTORY: Family History  Problem Relation Age of Onset   Breast cancer Maternal Aunt      MEDICATIONS AT HOME: Prior to Admission medications   Medication Sig Start Date End Date Taking? Authorizing Provider  apixaban (ELIQUIS) 5 MG TABS tablet Take 1 tablet (5 mg total) by mouth 2 (two) times daily. 08/16/22   Shawna Clamp, MD  atorvastatin (LIPITOR) 80 MG tablet Take 0.5 tablets (40 mg total) by mouth daily. 08/16/22   Shawna Clamp, MD  BD INSULIN SYRINGE U/F 30G X 1/2" 0.5 ML MISC  03/29/20   [provider]  cholecalciferol (VITAMIN D) 1000 units tablet Take 2,000 Units by mouth daily at 12 noon.     [provider]  clopidogrel (PLAVIX) 75 MG tablet Take 1 tablet (75 mg total) by mouth daily. 06/23/21   Minna Merritts, MD  ferrous sulfate 325 (65 FE) MG EC tablet Take 1 tablet (325 mg total) by mouth daily at 2 PM. 12/23/20   Cletis Athens, MD  HUMALOG KWIKPEN 100 UNIT/ML KwikPen INJECT 38 UNITS UNDER THE SKIN DAILY Patient taking differently: 10-12 Units in the  morning and at bedtime. 10-12 units BID prn 03/22/22   Cletis Athens, MD  insulin glargine (LANTUS) 100 UNIT/ML injection INJECT 45 UNITS UNDER THE SKIN AT BEDTIME Patient taking differently: 20 Units at bedtime. Pt takes 20 units depending on blood sugar level 02/26/21   Beckie Salts, FNP  isosorbide mononitrate (IMDUR) 30 MG 24 hr tablet Take 0.5 tablets (15 mg total) by mouth daily. 08/17/22   Shawna Clamp, MD  levothyroxine (SYNTHROID) 100 MCG tablet TAKE 1 TABLET DAILY BEFORE BREAKFAST 03/22/22   Cletis Athens, MD  metoprolol tartrate (LOPRESSOR) 25 MG tablet Take 0.5 tablets (12.5 mg total) by mouth 2 (two) times daily. 08/16/22   Shawna Clamp, MD  Multiple Vitamin (MULTIVITAMIN)  capsule Take 1 capsule by mouth daily at 12 noon.     [provider]  Na Sulfate-K Sulfate-Mg Sulf 17.5-3.13-1.6 GM/177ML SOLN Take by mouth as directed. 07/06/22   [provider]  nitroGLYCERIN (NITROSTAT) 0.4 MG SL tablet Place 1 tablet (0.4 mg total) under the tongue every 5 (five) minutes as needed for chest pain. 06/23/21   Minna Merritts, MD  Polyethyl Glycol-Propyl Glycol (SYSTANE OP) Apply 1 drop to eye as needed (dry eyes).    [provider]      DRUG ALLERGIES: Allergies  Allergen Reactions   Shellfish Allergy Anaphylaxis    Throat swelling   Flexeril [Cyclobenzaprine] Hypertension     REVIEW OF SYSTEMS: CONSTITUTIONAL: No fatigue, weakness, fever, chills, weight gain/loss, headache EYES: No blurry or double vision. ENT: No tinnitus, postnasal drip, redness or soreness of the oropharynx. RESPIRATORY: No dyspnea, cough, wheeze, hemoptysis. CARDIOVASCULAR: Positive chest pain, negative orthopnea, palpitations, syncope. GASTROINTESTINAL: No nausea, vomiting, constipation, diarrhea, abdominal pain. No hematemesis, melena or hematochezia. GENITOURINARY: No dysuria, frequency, hematuria. ENDOCRINE: No polyuria or nocturia. No heat or cold intolerance. HEMATOLOGY: No anemia, bruising, bleeding. INTEGUMENTARY: No rashes, ulcers, lesions. MUSCULOSKELETAL: No pain, arthritis, swelling, gout. NEUROLOGIC: No numbness, tingling, weakness or ataxia. No seizure-type activity. PSYCHIATRIC: No anxiety, depression, insomnia.  PHYSICAL EXAMINATION: VITAL SIGNS: Blood pressure (!) 86/72, pulse (!) 105, temperature 97.8 F (36.6 C), temperature source Oral, resp. rate (!) 22, height '5\' 8"'$  (1.727 m), weight 77.8 kg, SpO2 97 %.  GENERAL: 81 y.o.-year-old black female patient, well-developed, well-nourished lying in the bed in no acute distress.  Pleasant and cooperative.   HEENT: Head atraumatic, normocephalic. Pupils equal, round, reactive to light and  accommodation. No scleral icterus. Extraocular muscles intact. Oropharynx is clear. Mucus membranes moist. NECK: Supple, full range of motion. No JVD, no bruit heard. No cervical lymphadenopathy. CHEST: Normal breath sounds bilaterally. No wheezing, rales, rhonchi or crackles. No use of accessory muscles of respiration.  No reproducible chest wall tenderness.  CARDIOVASCULAR: S1, S2 normal. No murmurs, rubs, or gallops appreciated. Cap refill <2 seconds. ABDOMEN: Soft, nontender, nondistended. No rebound, guarding, rigidity. Normoactive bowel sounds present in all four quadrants. No organomegaly or mass. EXTREMITIES: Full range of motion. No pedal edema, cyanosis, or clubbing. NEUROLOGIC: Cranial nerves II through XII are grossly intact with no focal sensorimotor deficit. Muscle strength 5/5 in all extremities. Sensation intact. Gait not checked. PSYCHIATRIC: The patient is alert and oriented x 3. Normal affect, mood, thought content. SKIN: Warm, dry, and intact without obvious rash, lesion, or ulcer.  LABORATORY PANEL:  CBC Recent Labs  Lab 08/29/2022 1743  WBC 9.5  HGB 10.6*  HCT 32.9*  PLT 349   ----------------------------------------------------------------------------------------------------------------- Chemistries Recent Labs  Lab 08/12/22 0533 08/13/22 0500 08/16/22 0457 08/26/2022 1743  NA 143   < > 139 130*  K 3.4*   < > 3.7 4.4  CL 106   < > 104 96*  CO2 28   < > 28 22  GLUCOSE 103*   < > 100* 438*  BUN 29*   < > 23 22  CREATININE 1.03*   < > 0.78 1.07*  CALCIUM 8.3*   < > 8.2* 8.6*  MG 1.9   < > 2.0  --   AST 81*  --   --   --   ALT 55*  --   --   --   ALKPHOS 79  --   --   --   BILITOT 1.1  --   --   --    < > = values in this interval not displayed.   ------------------------------------------------------------------------------------------------------------------ Cardiac Enzymes No results for input(s): "TROPONINI" in the last 168  hours. ------------------------------------------------------------------------------------------------------------------  RADIOLOGY: DG Chest Port 1 View  Result Date: 09/12/2022 CLINICAL DATA:  Chest pain. EXAM: PORTABLE CHEST 1 VIEW COMPARISON:  August 10, 2022. FINDINGS: The heart size and mediastinal contours are within normal limits. Both lungs are clear. Status post coronary bypass graft. The visualized skeletal structures are unremarkable. IMPRESSION: No active disease. Electronically Signed   By: Marijo Conception M.D.   On: 09/13/2022 17:57    EKG: Sinus tach at 106 with leftward axis, RBBB  IMPRESSION AND PLAN:  This is a 81 y.o. 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 Chest pain, elevated troponin - ?new or downtrending from prior NSTEMI - Admit to IP with telemetry monitoring. - Trend troponins - Heparin, morphine, nitrate, beta blocker, Plavix, aspirin and statin ordered.  Hold Eliquis for now - Cardiology consult requested.   2. Hyperglycemia without DKA - Accuchecks achs with RISS coverage - Heart healthy, carb controlled diet - Continue Lantus  3. Hypothyroidism - Continue Synthroid - Check TSH  4. History of Fe deficiency anemia - Continue FeSO4  5. History of HTN - Continue metoprolol  6. History of HLD - Continue atorvastatin  Admission status: IP telemetry Diet/Nutrition: Heart healthy, carb controlled Fluids: HL DVT Px: Heparin and early ambulation Code Status: Full Disposition Plan: To home in 1-2 days  All the records are reviewed and case discussed with ED provider. Management plans discussed with the patient and/or family who express understanding and agree with plan of care.   TOTAL TIME TAKING CARE OF THIS PATIENT: 60 minutes.   Osei Anger D.O. on 09/01/2022 at 7:10 PM CC: Primary care physician;  Cletis Athens, MD     Note: This dictation was prepared with Dragon dictation along with smaller phrase technology. Any transcriptional errors that result from this process are unintentional.

## 2022-08-18 NOTE — Consult Note (Signed)
ANTICOAGULATION CONSULT NOTE - Initial Consult  Pharmacy Consult for Heparin Indication: chest pain/ACS  Allergies  Allergen Reactions   Shellfish Allergy Anaphylaxis    Throat swelling   Flexeril [Cyclobenzaprine] Hypertension    Patient Measurements:   Heparin Dosing Weight: 77.8 kg  Vital Signs: Temp: 97.8 F (36.6 C) (11/01 1731) Temp Source: Oral (11/01 1731) BP: 94/52 (11/01 1839) Pulse Rate: 104 (11/01 1839)  Labs: Recent Labs    08/16/22 0457 09/15/2022 1743  HGB  --  10.6*  HCT  --  32.9*  PLT  --  349  CREATININE 0.78 1.07*  TROPONINIHS  --  3,639*    Estimated Creatinine Clearance: 46.8 mL/min (A) (by C-G formula based on SCr of 1.07 mg/dL (H)).   Medical History: Past Medical History:  Diagnosis Date   CAD (coronary artery disease)    a. 04/2017 Cath: LM 26, LAD 80p, 71m LCX 95ost, 759mEF 45-50%; b. 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   Diastolic dysfunction    a. 04/2017 Echo: EF 55-60%, no rwma, Gr1 DD, mildly dil LA; b. 08/2020 Echo: EF 55-60%, no rwma, GrII DD, nl RV fxn, RVSP 3036m, mod dil LA, mildly dil RA, Mod TR.   Hyperlipidemia    Hypertensive heart disease    Insulin dependent diabetes mellitus    PAD [facility]peripheral artery disease) ([facility]  a. 02/2021 PTA/DCBA R Peroneal, R Popliteal, distal R SFA.   PSVT (paroxysmal supraventricular tachycardia)    a. 02/2022 Zio: Predominantly sinus rhythm @ 61 (36-218). 2 NSVT runs (fastest/longest 6 beats @ 218). 37 SVT/A tach runs (fastest 185 x 5 beats, longest 32.4 secs @ 107). Triggered events = RSR, PAC.   [facility]  a. 01/2022 R sided wkns/aphasia/tremor/slurred speech-->Ss resolved; b. 02/2022 MRI/A: punctate subacute inf vs artifact-post limb of L int capsule. 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 Mod, chronic small vessel isch changes within the cerebral white matter.  Subcm chronic infarct- L cerebellar hemisphere.  Sev dzs prox P2 seg of R PCA. Mod  dzs A1 R antClinical cytogeneticist  Medications:  PTA apixaban '5mg'$  PO BID  Assessment: Carrie Kane a [age]o. 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  Brought to ED woth reports of chest pain.  She rec'd ASA 325 and 2 nitro by EMS without relief. Troponin elevated X 2.  Pharmacy consulted to initiate heparin gtt.    Baseline Labs:  HL >1.10, aPTT 32, Hgb 10.6, HCT 32.9, PLTS 349  Goal of Therapy:  Heparin level 0.3-0.7 units/ml Monitor platelets by anticoagulation protocol: Yes   Plan:  Give 4000 units bolus x 1 Begin heparin gtt at 950 units/hr Check HL/aPTT in 8 hours Continue to monitor H&H and Plts daily while on heparin  JusAlison Murray/10/2021,6:42 PM

## 2022-08-18 NOTE — ED Triage Notes (Addendum)
Pt presents to ED via [facility] from [facility] with c/o of midsternum CP. Pt states recently Dx'ed with PE and NSTEMI, D/C'ed this past Monday.  Pt states pain started while sitting down watching TV.    EMS states pt was given 2 nitro and 324 ASA.   CBG 564 by EMS

## 2022-08-18 NOTE — ED Notes (Addendum)
Hospitalist notified of the patients pain level; hypotension; and elevated CBG.  Pt refusing nitro at this time.

## 2022-08-19 ENCOUNTER — Ambulatory Visit: Payer: Medicare Other | Admitting: Podiatry

## 2022-08-19 ENCOUNTER — Inpatient Hospital Stay: Payer: Self-pay

## 2022-08-19 DIAGNOSIS — I5032 Chronic diastolic (congestive) heart failure: Secondary | ICD-10-CM | POA: Diagnosis not present

## 2022-08-19 DIAGNOSIS — E663 Overweight: Secondary | ICD-10-CM | POA: Diagnosis not present

## 2022-08-19 DIAGNOSIS — E039 Hypothyroidism, unspecified: Secondary | ICD-10-CM

## 2022-08-19 DIAGNOSIS — R079 Chest pain, unspecified: Secondary | ICD-10-CM | POA: Diagnosis not present

## 2022-08-19 LAB — CBC
HCT: 26.9 % — ABNORMAL LOW (ref 36.0–46.0)
Hemoglobin: 8.7 g/dL — ABNORMAL LOW (ref 12.0–15.0)
MCH: 30 pg (ref 26.0–34.0)
MCHC: 32.3 g/dL (ref 30.0–36.0)
MCV: 92.8 fL (ref 80.0–100.0)
Platelets: 338 10*3/uL (ref 150–400)
RBC: 2.9 MIL/uL — ABNORMAL LOW (ref 3.87–5.11)
RDW: 16.2 % — ABNORMAL HIGH (ref 11.5–15.5)
WBC: 12.1 10*3/uL — ABNORMAL HIGH (ref 4.0–10.5)
nRBC: 0 % (ref 0.0–0.2)

## 2022-08-19 LAB — APTT
aPTT: 136 seconds — ABNORMAL HIGH (ref 24–36)
aPTT: 30 seconds (ref 24–36)

## 2022-08-19 LAB — BASIC METABOLIC PANEL
Anion gap: 13 (ref 5–15)
BUN: 25 mg/dL — ABNORMAL HIGH (ref 8–23)
CO2: 21 mmol/L — ABNORMAL LOW (ref 22–32)
Calcium: 7.8 mg/dL — ABNORMAL LOW (ref 8.9–10.3)
Chloride: 99 mmol/L (ref 98–111)
Creatinine, Ser: 1.06 mg/dL — ABNORMAL HIGH (ref 0.44–1.00)
GFR, Estimated: 53 mL/min — ABNORMAL LOW (ref >60)
Glucose, Bld: 355 mg/dL — ABNORMAL HIGH (ref 70–99)
Potassium: 4.7 mmol/L (ref 3.5–5.1)
Sodium: 133 mmol/L — ABNORMAL LOW (ref 135–145)

## 2022-08-19 LAB — HEPARIN LEVEL (UNFRACTIONATED)
Heparin Unfractionated: >1.1 IU/mL — ABNORMAL HIGH (ref 0.30–0.70)
Heparin Unfractionated: >1.1 IU/mL — ABNORMAL HIGH (ref 0.30–0.70)

## 2022-08-19 LAB — TROPONIN I (HIGH SENSITIVITY): Troponin I (High Sensitivity): 15041 ng/L (ref <18)

## 2022-08-19 LAB — CKMB (ARMC ONLY): CK, MB: 6.7 ng/mL — ABNORMAL HIGH (ref 0.5–5.0)

## 2022-08-19 LAB — CBG MONITORING, ED
Glucose-Capillary: 270 mg/dL — ABNORMAL HIGH (ref 70–99)
Glucose-Capillary: 139 mg/dL — ABNORMAL HIGH (ref 70–99)
Glucose-Capillary: 93 mg/dL (ref 70–99)
Glucose-Capillary: 104 mg/dL — ABNORMAL HIGH (ref 70–99)

## 2022-08-19 LAB — BRAIN NATRIURETIC PEPTIDE: B Natriuretic Peptide: 3657.1 pg/mL — ABNORMAL HIGH (ref 0.0–100.0)

## 2022-08-19 MED ORDER — SODIUM CHLORIDE 0.9% FLUSH: 10.0000 mL | INTRAVENOUS | Status: DC | PRN

## 2022-08-19 MED ORDER — MORPHINE SULFATE (PF) 2 MG/ML IV SOLN
2.0000 mg | Freq: Once | INTRAVENOUS | Status: AC
  Administered 2022-08-19: 2 mg via INTRAVENOUS
  Filled 2022-08-19: qty 1

## 2022-08-19 MED ORDER — MORPHINE SULFATE (PF) 2 MG/ML IV SOLN
2.0000 mg | INTRAVENOUS | Status: AC | PRN
  Administered 2022-08-20 (×2): 2 mg via INTRAVENOUS
  Filled 2022-08-19 (×2): qty 1

## 2022-08-19 MED ORDER — HEPARIN BOLUS VIA INFUSION
1200.0000 [IU] | Freq: Once | INTRAVENOUS | Status: AC
  Administered 2022-08-19: 1200 [IU] via INTRAVENOUS
  Filled 2022-08-19: qty 1200

## 2022-08-19 MED ORDER — SODIUM CHLORIDE 0.9% FLUSH
10.0000 mL | Freq: Two times a day (BID) | INTRAVENOUS | Status: DC
  Administered 2022-08-20 – 2022-08-22 (×5): 10 mL

## 2022-08-19 MED ORDER — HEPARIN (PORCINE) 25000 UT/250ML-% IV SOLN
850.0000 [IU]/h | INTRAVENOUS | Status: DC
  Administered 2022-08-19 – 2022-08-20 (×2): 900 [IU]/h via INTRAVENOUS
  Administered 2022-08-22: 850 [IU]/h via INTRAVENOUS
  Filled 2022-08-19 (×3): qty 250

## 2022-08-19 NOTE — Consult Note (Signed)
ANTICOAGULATION CONSULT NOTE  Pharmacy Consult for Heparin Indication: chest pain/ACS  Allergies  Allergen Reactions   Shellfish Allergy Anaphylaxis    Throat swelling   Flexeril [Cyclobenzaprine] Hypertension    Patient Measurements: Height: '5\' 8"'$  (172.7 cm) (from 08/16/22 (previous admit)) Weight: 77.8 kg (171 lb 8.3 oz) (from 08/16/22 (previous admit)) IBW/kg (Calculated) : 63.9 Heparin Dosing Weight: 77.8 kg  Vital Signs: Temp: 98 F (36.7 C) (11/02 0433) Temp Source: Oral (11/02 0433) BP: 94/54 (11/02 0433) Pulse Rate: 94 (11/02 0433)  Labs: Recent Labs    08/26/2022 0424 09/03/2022 1743 09/07/2022 1831 09/09/2022 1958 08/19/22 0424  HGB  --  10.6*  --   --  8.7*  HCT  --  32.9*  --   --  26.9*  PLT  --  349  --   --  338  APTT  --   --  32  --  136*  LABPROT  --   --  15.6*  --   --   INR  --   --  1.3*  --   --   HEPARINUNFRC  --   --  >1.10*  --  >1.10*  CREATININE  --  1.07*  --   --  1.06*  TROPONINIHS 15,041* 3,639*  --  2,308*  --      Estimated Creatinine Clearance: 47.2 mL/min (A) (by C-G formula based on SCr of 1.06 mg/dL (H)).   Medical History: Past Medical History:  Diagnosis Date   CAD (coronary artery disease)    a. 04/2017 Cath: LM 57, LAD 80p, 34m LCX 95ost, 736mEF 45-50%; b. 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   Diastolic dysfunction    a. 04/2017 Echo: EF 55-60%, no rwma, Gr1 DD, mildly dil LA; b. 08/2020 Echo: EF 55-60%, no rwma, GrII DD, nl RV fxn, RVSP 3022m, mod dil LA, mildly dil RA, Mod TR.   Hyperlipidemia    Hypertensive heart disease    Insulin dependent diabetes mellitus    PAD (peripheral artery disease) ([facility] [place]  a. 02/2021 PTA/DCBA R Peroneal, R Popliteal, distal R SFA.   PSVT (paroxysmal supraventricular tachycardia)    a. 02/2022 Zio: Predominantly sinus rhythm @ 61 (36-218). 2 NSVT runs (fastest/longest 6 beats @ 218). 37 SVT/A tach runs (fastest 185 x 5 beats, longest 32.4 secs @ 107). Triggered events = RSR, PAC.    [facility]  a. 01/2022 R sided wkns/aphasia/tremor/slurred speech-->Ss resolved; b. 02/2022 MRI/A: punctate subacute inf vs artifact-post limb of L int capsule. 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 Mod, chronic small vessel isch changes within the cerebral white matter.  Subcm chronic infarct- L cerebellar hemisphere.  Sev dzs prox P2 seg of R PCA. Mod dzs A1 R antClinical cytogeneticist  Medications:  PTA apixaban '5mg'$  PO BID  Assessment: BetLakely Elmendorf a 81 26o. 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  Brought to ED woth reports of chest pain.  She rec'd ASA 325 and 2 nitro by EMS without relief. Troponin elevated X 2.  Pharmacy consulted to initiate heparin gtt.    Baseline Labs:  HL >1.10, aPTT 32, Hgb 10.6, HCT 32.9, PLTS 349  Goal of Therapy:  Heparin level 0.3-0.7 units/ml Monitor platelets by anticoagulation protocol: Yes   11/02 0424 aPTT 136 / HL >1.10  Plan:  Decrease heparin infusion to 750 units/hr Recheck aPTT in 8 hr after rate change Following aPTT until correlation w/ HL confirmed HL & CBC daily while on heparin  Renda Rolls, PharmD, [facility] 08/19/2022 6:14 AM

## 2022-08-19 NOTE — Progress Notes (Addendum)
[facility] Progress Note  Patient: Carrie Kane    MPN:[id number]  DOA: 08/22/2022    Date of Service: the patient was seen and examined on 08/19/2022  Brief hospital course: 81 year old female with past medical history of CAD with CABG, PAD, diabetes mellitus, hypertension and recent hospitalization from 10/22 - 10/30 for DKA with complications of non-STEMI and PE presented to the emergency room with chest pain.  Assessment and Plan: Assessment and Plan: * Chest pain, rule out acute myocardial infarction Chest pain resolved.  No new findings on EKG.  Cardiology consulted.  Markedly elevated troponins, however this may be a downtrend from previous.  Chronic diastolic (congestive) heart failure (HCC) No evidence of acute heart failure.  Will check BNP.  Type 2 diabetes mellitus without complication, with long-term current use of insulin ([place]) Recent hospitalization for DKA.  CBG stable  Hypothyroidism Continue Synthroid  Overweight (BMI 25.0-29.9) Meets criteria BMI greater than 25       Body mass index is 26.08 kg/m.        Consultants: Cardiology  Procedures: None  Antimicrobials: None  Code Status: Full code   Subjective: Patient feeling better, denies any chest pain  Objective: Vital signs were reviewed and unremarkable. Vitals:   08/19/22 1530 08/19/22 1554  BP: 113/64   Pulse: 84   Resp: 16   Temp:  97.9 F (36.6 C)  SpO2: 99%    No intake or output data in the 24 hours ending 08/19/22 1631 Filed Weights   08/25/2022 1850  Weight: 77.8 kg   Body mass index is 26.08 kg/m.  Exam:  General: Alert and oriented x3, no acute distress HEENT: Normocephalic, atraumatic, mucous membranes are moist Cardiovascular: Regular rate and rhythm, S1-S2, 2 out of 6 systolic ejection murmur Respiratory: Clear to auscultation bilaterally Abdomen: Soft, nontender, nondistended, positive bowel sounds Musculoskeletal: No clubbing or cyanosis, trace pitting  edema Skin: No skin breaks, tears or lesions Psychiatry: Patient is appropriate, no evidence of psychoses Neurology: No focal deficits  Data Reviewed: Creatinine 1.06.  Sodium of 133.  Troponin of 2300  Disposition:  Status is: Inpatient Remains inpatient appropriate because: Awaiting further evaluation by cardiology    Anticipated discharge date: 11/3  Family Communication: We will call family DVT Prophylaxis: Heparin infusion    Author: Annita Brod ,MD 08/19/2022 4:31 PM  To reach On-call, see care teams to locate the attending and reach out via [email or web address]. Between 7PM-7AM, please contact night-coverage If you still have difficulty reaching the attending provider, please page the [facility] (Director on Call) for [facility] on amion for assistance.

## 2022-08-19 NOTE — Consult Note (Signed)
ANTICOAGULATION CONSULT NOTE  Pharmacy Consult for Heparin Indication: chest pain/ACS  Allergies  Allergen Reactions   Shellfish Allergy Anaphylaxis    Throat swelling   Flexeril [Cyclobenzaprine] Hypertension    Patient Measurements: Height: '5\' 8"'$  (172.7 cm) (from 08/16/22 (previous admit)) Weight: 77.8 kg (171 lb 8.3 oz) (from 08/16/22 (previous admit)) IBW/kg (Calculated) : 63.9 Heparin Dosing Weight: 77.8 kg  Vital Signs: Temp: 97.9 F (36.6 C) (11/02 1110) Temp Source: Oral (11/02 1110) BP: 107/60 (11/02 1430) Pulse Rate: 86 (11/02 1430)  Labs: Recent Labs    09/07/2022 0424 09/02/2022 1743 09/16/2022 1831 09/02/2022 1958 08/19/22 0424 08/19/22 1400  HGB  --  10.6*  --   --  8.7*  --   HCT  --  32.9*  --   --  26.9*  --   PLT  --  349  --   --  338  --   APTT  --   --  32  --  136* 30  LABPROT  --   --  15.6*  --   --   --   INR  --   --  1.3*  --   --   --   HEPARINUNFRC  --   --  >1.10*  --  >1.10* >1.10*  CREATININE  --  1.07*  --   --  1.06*  --   CKMB  --   --   --  6.7*  --   --   TROPONINIHS 15,041* 0,109*  --  2,308*  --   --      Estimated Creatinine Clearance: 47.2 mL/min (A) (by C-G formula based on SCr of 1.06 mg/dL (H)).   Medical History: Past Medical History:  Diagnosis Date   CAD (coronary artery disease)    a. 04/2017 Cath: LM 72, LAD 80p, 52m LCX 95ost, 750mEF 45-50%; b. 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   Diastolic dysfunction    a. 04/2017 Echo: EF 55-60%, no rwma, Gr1 DD, mildly dil LA; b. 08/2020 Echo: EF 55-60%, no rwma, GrII DD, nl RV fxn, RVSP 3036m, mod dil LA, mildly dil RA, Mod TR.   Hyperlipidemia    Hypertensive heart disease    Insulin dependent diabetes mellitus    PAD (peripheral artery disease) ([facility]  a. 02/2021 PTA/DCBA R Peroneal, R Popliteal, distal R SFA.   PSVT (paroxysmal supraventricular tachycardia)    a. 02/2022 Zio: Predominantly sinus rhythm @ 61 (36-218). 2 NSVT runs (fastest/longest 6 beats @  218). 37 SVT/A tach runs (fastest 185 x 5 beats, longest 32.4 secs @ 107). Triggered events = RSR, PAC.   [facility]  a. 01/2022 R sided wkns/aphasia/tremor/slurred speech-->Ss resolved; b. 02/2022 MRI/A: punctate subacute inf vs artifact-post limb of L int capsule. 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 Mod, chronic small vessel isch changes within the cerebral white matter.  Subcm chronic infarct- L cerebellar hemisphere.  Sev dzs prox P2 seg of R PCA. Mod dzs A1 R antClinical cytogeneticist  Medications:  PTA Apixaban '5mg'$  PO BID (last dose 11/1 @ 0900) Inpatient heparin infusion (11/1 >>>) Allergies: No AC/APT related allergies  Assessment: Carrie Kane a [age]o. 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  Brought to ED woth reports of chest pain.  She rec'd ASA 325 and  2 nitro by EMS without relief. Troponin elevated X 2.  Pharmacy consulted to initiate heparin gtt.    Goal of Therapy:  Heparin level 0.3-0.7 units/ml Monitor platelets by anticoagulation protocol: Yes   Date Time aPTT/HL Rate/Comment 11/1 1831 32/ >1.10 Baseline 11/2 0424 136/>1.10 950 u/hr > 750 u/hr 11/2 1400 30/>1.10 750 u/hr > 900 u/hr  Plan:  Give heparin 1200 unit bolus then increase heparin infusion to 900 units/hr Check aPTT/Anti-Xa level in 8 hours and daily once consecutively therapeutic.  Titrate by aPTT's until lab correlation is noted, then titrate by anti-xa alone. Continue to monitor H&H and platelets daily while on heparin gtt  [place] Pharmacist 08/19/2022 3:36 PM

## 2022-08-19 NOTE — Assessment & Plan Note (Signed)
Continue Synthroid °

## 2022-08-19 NOTE — Assessment & Plan Note (Signed)
Meets criteria BMI greater than 25 

## 2022-08-19 NOTE — ED Notes (Addendum)
Iv team in room at this time, pt with edema in both arms, pitting edema in both legs. No urine output at this time, MD notified.

## 2022-08-19 NOTE — Assessment & Plan Note (Addendum)
Initially chest pain resolved, but by afternoon of 11/3, had recurred with associated shortness of breath.  No new findings on EKG on admission.  Cardiology consulted and plan to take patient for heart catheterization on Monday.

## 2022-08-19 NOTE — Progress Notes (Addendum)
       CROSS COVER NOTE  NAME: Carrie Kane MRN: [id number] DOB : 11/11/1940 ATTENDING PHYSICIAN: Harvie Bridge, DO    Date of Service   08/19/2022   HPI/Events of Note   Message received from RN requesting order be entered for PRN morphine for ongoing chest pain.  Interventions   Assessment/Plan:  Morphine PRN     This document was prepared using Dragon voice recognition software and may include unintentional dictation errors.  Neomia Glass DNP, MBA, FNP-BC Nurse Practitioner [facility] Pager [phone]

## 2022-08-19 NOTE — Assessment & Plan Note (Addendum)
Recent hospitalization for DKA.  CBGs have started to trend upward, possibly in the setting of acute cardiac ischemia.  Increase coverage.

## 2022-08-19 NOTE — ED Notes (Signed)
Bg 139

## 2022-08-19 NOTE — Assessment & Plan Note (Addendum)
Newly depressed ejection fraction in less than 2 weeks.  Unclear if this is from worsening decompensation of heart function versus read while volume overloaded.  Greatly appreciate cardiology help.  Continue diuresis.  Left heart catheterization on 11/6.

## 2022-08-19 NOTE — Hospital Course (Addendum)
81 year old female with past medical history of CAD with CABG, PAD, diabetes mellitus, hypertension and recent hospitalization from 10/22 - 10/30 for DKA with complications of non-STEMI and PE presented to the emergency room on morning of 11/1 with chest pain.  At time of admission, noted to have troponins around 15,000, although this was down from greater than 24,000 a week prior.  Cardiology consulted.  Troponins trended downward and by end of day down to 2300.  By following day, patient chest pain-free.  Following admission, BNP checked on evening of 11/2 (given previous history of diastolic heart failure) and found to be markedly elevated at 3600.  Patient seen by cardiology and started on IV Lasix.  Echocardiogram done 11/3 noted markedly depressed ejection fraction of 25 to 30% (as compared to echocardiogram done on 10/22.  Patient ordered more Lasix and cardiology plans to take patient for left and right heart catheterization on 11/6.

## 2022-08-19 NOTE — ED Notes (Signed)
Pt has purewic in place, no urine noted in canister, pt's brief is dry. Pt states she does not feel like she has to void at this time and the last time she voided was before she came to the ED. Bladder scan used, 321 ml urine present in pt's bladder. Pt encouraged to void, MD notified.

## 2022-08-19 NOTE — ED Notes (Signed)
This RN unable to establish new IV access or draw blood from pt, lab called to collect blood

## 2022-08-19 NOTE — ED Notes (Signed)
Heparin drip paused, line occluded, IV insertion attempt without success, IV team consult placed.

## 2022-08-20 ENCOUNTER — Inpatient Hospital Stay (HOSPITAL_COMMUNITY)
Admit: 2022-08-20 | Discharge: 2022-08-20 | Disposition: A | Discharge status: Home / Self Care | Payer: Medicare Other | Attending: Internal Medicine | Admitting: Internal Medicine

## 2022-08-20 DIAGNOSIS — I5031 Acute diastolic (congestive) heart failure: Secondary | ICD-10-CM

## 2022-08-20 DIAGNOSIS — L8990 Pressure ulcer of unspecified site, unspecified stage: Secondary | ICD-10-CM | POA: Diagnosis present

## 2022-08-20 DIAGNOSIS — I5021 Acute systolic (congestive) heart failure: Secondary | ICD-10-CM | POA: Diagnosis not present

## 2022-08-20 DIAGNOSIS — R079 Chest pain, unspecified: Secondary | ICD-10-CM

## 2022-08-20 DIAGNOSIS — E440 Moderate protein-calorie malnutrition: Secondary | ICD-10-CM

## 2022-08-20 DIAGNOSIS — D72829 Elevated white blood cell count, unspecified: Secondary | ICD-10-CM

## 2022-08-20 LAB — GLUCOSE, CAPILLARY
Glucose-Capillary: 238 mg/dL — ABNORMAL HIGH (ref 70–99)
Glucose-Capillary: 278 mg/dL — ABNORMAL HIGH (ref 70–99)
Glucose-Capillary: 186 mg/dL — ABNORMAL HIGH (ref 70–99)
Glucose-Capillary: 135 mg/dL — ABNORMAL HIGH (ref 70–99)

## 2022-08-20 LAB — TROPONIN I (HIGH SENSITIVITY): Troponin I (High Sensitivity): >24000 ng/L (ref <18)

## 2022-08-20 LAB — CBC
HCT: 29.3 % — ABNORMAL LOW (ref 36.0–46.0)
Hemoglobin: 9.6 g/dL — ABNORMAL LOW (ref 12.0–15.0)
MCH: 29.8 pg (ref 26.0–34.0)
MCHC: 32.8 g/dL (ref 30.0–36.0)
MCV: 91 fL (ref 80.0–100.0)
Platelets: 405 10*3/uL — ABNORMAL HIGH (ref 150–400)
RBC: 3.22 MIL/uL — ABNORMAL LOW (ref 3.87–5.11)
RDW: 16.6 % — ABNORMAL HIGH (ref 11.5–15.5)
WBC: 19.2 10*3/uL — ABNORMAL HIGH (ref 4.0–10.5)
nRBC: 0 % (ref 0.0–0.2)

## 2022-08-20 LAB — ECHOCARDIOGRAM LIMITED
Calc EF: 35.8 %
Height: 68 in
S' Lateral: 4.7 cm
Single Plane A2C EF: 28.6 %
Single Plane A4C EF: 34.3 %
Weight: 2624.36 oz

## 2022-08-20 LAB — HEPARIN LEVEL (UNFRACTIONATED): Heparin Unfractionated: >1.1 IU/mL — ABNORMAL HIGH (ref 0.30–0.70)

## 2022-08-20 LAB — PROCALCITONIN: Procalcitonin: 2.22 ng/mL

## 2022-08-20 LAB — APTT
aPTT: 67 seconds — ABNORMAL HIGH (ref 24–36)
aPTT: 84 seconds — ABNORMAL HIGH (ref 24–36)

## 2022-08-20 MED ORDER — FUROSEMIDE 10 MG/ML IJ SOLN: 40.0000 mg | Freq: Two times a day (BID) | INTRAMUSCULAR | Status: DC

## 2022-08-20 MED ORDER — FUROSEMIDE 10 MG/ML IJ SOLN
40.0000 mg | Freq: Once | INTRAMUSCULAR | Status: AC
  Administered 2022-08-20: 40 mg via INTRAVENOUS
  Filled 2022-08-20: qty 4

## 2022-08-20 MED ORDER — ACETAMINOPHEN 325 MG PO TABS
650.0000 mg | ORAL_TABLET | ORAL | Status: DC | PRN
  Administered 2022-08-20 – 2022-08-22 (×2): 650 mg via ORAL
  Filled 2022-08-20 (×2): qty 2

## 2022-08-20 MED ORDER — ASPIRIN 81 MG PO TBEC
81.0000 mg | DELAYED_RELEASE_TABLET | Freq: Every day | ORAL | Status: DC
  Administered 2022-08-20 – 2022-08-22 (×3): 81 mg via ORAL
  Filled 2022-08-20 (×3): qty 1

## 2022-08-20 MED ORDER — MORPHINE SULFATE (PF) 2 MG/ML IV SOLN
1.0000 mg | INTRAVENOUS | Status: DC | PRN
  Administered 2022-08-20 – 2022-08-23 (×12): 1 mg via INTRAVENOUS
  Filled 2022-08-20 (×12): qty 1

## 2022-08-20 MED ORDER — FUROSEMIDE 10 MG/ML IJ SOLN
40.0000 mg | Freq: Two times a day (BID) | INTRAMUSCULAR | Status: DC
  Administered 2022-08-21: 40 mg via INTRAVENOUS
  Filled 2022-08-20: qty 4

## 2022-08-20 MED ORDER — MORPHINE SULFATE (PF) 2 MG/ML IV SOLN
1.0000 mg | INTRAVENOUS | Status: AC
  Administered 2022-08-20: 1 mg via INTRAVENOUS
  Filled 2022-08-20: qty 1

## 2022-08-20 NOTE — Progress Notes (Signed)
*  PRELIMINARY RESULTS* Echocardiogram 2D Echocardiogram has been performed.  Sherrie Sport 08/20/2022, 1:07 PM

## 2022-08-20 NOTE — Progress Notes (Signed)
Spoke with Primary RN Vicente Males re: PICC order, he's not sure if PICC is still needed. Secure chat sent to attending MD Hugelmeyer and Gollan MD. Patient has 2 working IV access, 1 LUA midline and L AC. Will follow up.

## 2022-08-20 NOTE — Consult Note (Signed)
Cardiology Consultation:   Patient ID: Ted Leonhart; [id number]; Jul 02, 1941   Admit date: 09/09/2022 Date of Consult: 08/20/2022  Primary Care Provider: Cletis Athens, MD Primary Cardiologist: Rockey Situ Primary Electrophysiologist:  None   Patient Profile:   Airlie Blumenberg is a 81 y.o. female with a hx of CAD s/p CABG with recent NSTEMI, HFpEF, PAF, SVT, CVA, PAD s/p PTA, DM with DKA, HTN, and HLD who is being seen today for the evaluation of elevated troponin at the request of Dr. [place] Pink.  History of Present Illness:   Ms. Braun was first seen 02/2017 for chest pain and underwent stress testing which was abnormal.  Left heart cath revealed severe multivessel disease and she was referred for CABG.  Systolic function was normal at that time.  She has a history of PAD and claudication.  02/2021 she underwent PTA and drug-eluting stent with balloon angioplasty of the right peroneal, popliteal, and distal SFA arteries.  She continues to have claudication and abnormal ABIs.  02/2022 follow-up angiography was recommended but she declined.  01/2022 she presented with right-sided weakness, aphasia, and slurred speech.  Symptoms resolved after about 24 hours.  CTA showed chronic ischemic changes without any acute intracranial process.  She left AMA and follow-up MRI showed punctate subacute infarct versus artifact in the posterior limb of the internal capsule and chronic lacunar infarcts in the right caudate nucleus, right thalamus, and left pons.  She also had moderate chronic small vessel ischemic changes within the cerebral white matter with subcentimeter chronic infarct in the left cerebellar hemisphere and severe proximal disease in the P2 segment of the right PCA and moderate disease in the A1 segment of the right ACA.  She wore an ambulatory monitor that did not show any evidence of atrial fibrillation but she did have 37 runs of paroxysmal SVT and atrial tachycardia.  She was asymptomatic.   She  was recently admitted to [facility] from 10/22 to 62/69 with metabolic encephalopathy with DKA complicated by acute hypoxic respiratory failure requiring mechanical ventilation, new onset Afib, NSTEMI, anemia, and AKI. Echo showed an EF of 65-70%, no RWMA, moderate asymmetric LVH, Gr1DD, RVSF and ventricular cavity size normal, mildly elevated PASP, mildly dilated left atrium, mild to moderate TR, mildly dilated ascending aorta measuring 37 mm, and an estimated right atrial pressure of 15 mm. R/LHC showed severe native left coronary artery system with distal left main 90%, severe disease in the small AV groove circumflex with occlusion of the proximal LAD and large dominant OM with minimal competitive flow. Small caliber heavily calcified RCA with proximal 60 to 70% stenosis felt to explain the level of troponin elevation. RHC readings relatively stable as below. Medical management was recommended.   She was discharged to a living facility on 08/16/2022. She reports she was not given her medications there. She returned to the ED on 08/28/2022 with chest pain described as a "sharp tightness" with associated SOB, nausea, and lower extremity swelling. Upon arrival to the ED, vitals were soft at 86/72 and have remained in the 90s to low 485I systolic. EKG showed sinus tachycardia with bifascicular block. Labs notable for HS-Tn 15041 trending to 3639 and 2308 (of note HS-Tn > 24000 on 10/24), BNP 3657 (prior 261 in 07/2022), hgb 8.7, BUN/SCr 25/1.06, TSH 6.187, free T4 1.18. CXR no active process. PTA Eliquis was held and she was placed on a heparin gtt. Currently with improved, though not resolved chest pain. Documented weight trend 77.8 to 74.4.   Past Medical  History:  Diagnosis Date   CAD (coronary artery disease)    a. 04/2017 Cath: LM 80, LAD 80p, 26m LCX 95ost, 755mEF 45-50%; b. 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   Diastolic dysfunction    a. 04/2017 Echo: EF 55-60%, no rwma, Gr1 DD, mildly dil LA;  b. 08/2020 Echo: EF 55-60%, no rwma, GrII DD, nl RV fxn, RVSP 3070m, mod dil LA, mildly dil RA, Mod TR.   Hyperlipidemia    Hypertensive heart disease    Insulin dependent diabetes mellitus    [place] artery disease) [place]  a. 02/2021 PTA/DCBA R Peroneal, R Popliteal, distal R SFA.   PSVT (paroxysmal supraventricular tachycardia)    a. 02/2022 [place]: Predominantly sinus rhythm @ 61 (36-218). 2 NSVT runs (fastest/longest 6 beats @ 218). 37 SVT/A tach runs (fastest 185 x 5 beats, longest 32.4 secs @ 107). Triggered events = RSR, PAC.   [facility]  a. 01/2022 R sided wkns/aphasia/tremor/slurred speech-->Ss resolved; b. 02/2022 MRI/A: punctate subacute inf vs artifact-post limb of L int capsule. 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 Mod, chronic small vessel isch changes within the cerebral white matter.  Subcm chronic infarct- L cerebellar hemisphere.  Sev dzs prox P2 seg of R PCA. Mod dzs A1 R antClinical cytogeneticist  Past Surgical History:  Procedure Laterality Date   ABDOMINAL HYSTERECTOMY     APPLICATION OF WOUND VAC  05/11/2022   Procedure: APPLICATION OF WOUND VAC;  Surgeon: CraRenee HarderD;  Location: [facility] ORS;  Service: Orthopedics;;   CARDIAC CATHETERIZATION     CATARACT EXTRACTION W/PHACO Right 10/26/2021   Procedure: CATARACT EXTRACTION PHACO AND INTRAOCULAR LENS PLACEMENT (IOC) RIGHT DIABETIC 16.27 01:23.8;  Surgeon: KinEulogio BearD;  [place] [facility];  Laterality: Right;  Please leave arrival at 8:00   CATARACT EXTRACTION W/PHACO Left 11/09/2021   Procedure: CATARACT EXTRACTION PHACO AND INTRAOCULAR LENS PLACEMENT (IOCSpringdaleEFT DIABETIC;  Surgeon: KinEulogio BearD;  [place];  Laterality: Left;  13.39 01:13.0   CORONARY ARTERY BYPASS GRAFT N/A 05/09/2017   Procedure: CORONARY ARTERY BYPASS GRAFTING (CABG) x 3 using left internal mammary artery and  right greater saphenous vein harvested endoscopically;  Surgeon: VanIvin PootD;  [facility] [place] [facility];  Laterality: N/A;   EYE SURGERY     HIP ARTHROPLASTY Left 05/11/2022   Procedure: ARTHROPLASTY BIPOLAR HIP (HEMIARTHROPLASTY);  Surgeon: CraRenee HarderD;  Location: [facility] ORS;  Service: Orthopedics;  Laterality: Left;   INTRAOPERATIVE TRANSESOPHAGEAL ECHOCARDIOGRAM N/A 05/09/2017   Procedure: INTRAOPERATIVE TRANSESOPHAGEAL ECHOCARDIOGRAM;  Surgeon: VanIvin PootD;  [facility] [place] [facility];  Laterality: N/A;   IR RADIOLOGIST EVAL & MGMT  12/04/2020   LEFT HEART CATH AND CORONARY ANGIOGRAPHY N/A 05/02/2017   Procedure: Left Heart Cath and Coronary Angiography;  Surgeon: AriWellington HampshireD;  [facility];  Service: Cardiovascular;  Laterality: N/A;   LEFT HEART CATH AND CORONARY ANGIOGRAPHY N/A 08/13/2022   Procedure: LEFT HEART CATH AND CORONARY ANGIOGRAPHY;  Surgeon: HarLeonie ManD;  [facility];  Service: Cardiovascular;  Laterality: N/A;   LOWER EXTREMITY ANGIOGRAPHY Left 02/09/2021   Procedure: LOWER EXTREMITY ANGIOGRAPHY;  Surgeon: DewAlgernon HuxleyD;  [place] [facility];  Service: Cardiovascular;  Laterality: Left;   LOWER EXTREMITY ANGIOGRAPHY Right 02/16/2021   Procedure: LOWER EXTREMITY  ANGIOGRAPHY;  Surgeon: Algernon Huxley, MD;  [facility];  Service: Cardiovascular;  Laterality: Right;     Home Meds: Prior to Admission medications   Medication Sig Start Date End Date Taking? Authorizing Provider  apixaban (ELIQUIS) 5 MG TABS tablet Take 1 tablet (5 mg total) by mouth 2 (two) times daily. 08/16/22  Yes Shawna Clamp, MD  atorvastatin (LIPITOR) 80 MG tablet Take 0.5 tablets (40 mg total) by mouth daily. 08/16/22  Yes Shawna Clamp, MD  cholecalciferol (VITAMIN D) 1000 units tablet Take 2,000 Units by mouth daily at 12 noon.    Yes [provider]  clopidogrel (PLAVIX) 75 MG tablet Take 1 tablet (75 mg total) by mouth daily. 06/23/21  Yes Minna Merritts, MD  ferrous sulfate 325 (65 FE) MG EC tablet Take 1 tablet (325 mg total) by mouth daily at 2 PM. 12/23/20  Yes Masoud, Viann Shove, MD  HUMALOG KWIKPEN 100 UNIT/ML KwikPen INJECT 38 UNITS UNDER THE SKIN DAILY Patient taking differently: Inject 0-6 Units into the skin 3 (three) times daily. (Based on glucose readings) 03/22/22  Yes Masoud, Viann Shove, MD  insulin glargine (LANTUS) 100 UNIT/ML injection INJECT 45 UNITS UNDER THE SKIN AT BEDTIME Patient taking differently: Inject 35 Units into the skin at bedtime. 02/26/21  Yes Beckie Salts, FNP  isosorbide mononitrate (IMDUR) 30 MG 24 hr tablet Take 0.5 tablets (15 mg total) by mouth daily. 08/17/22  Yes Shawna Clamp, MD  levothyroxine (SYNTHROID) 100 MCG tablet TAKE 1 TABLET DAILY BEFORE BREAKFAST 03/22/22  Yes Masoud, Viann Shove, MD  metoprolol tartrate (LOPRESSOR) 25 MG tablet Take 0.5 tablets (12.5 mg total) by mouth 2 (two) times daily. 08/16/22  Yes Shawna Clamp, MD  Multiple Vitamin (MULTIVITAMIN) capsule Take 1 capsule by mouth daily at 12 noon.    Yes [provider]  nitroGLYCERIN (NITROSTAT) 0.4 MG SL tablet Place 1 tablet (0.4 mg total) under the tongue every 5 (five) minutes as needed for chest pain. 06/23/21  Yes Gollan, Kathlene November, MD  Polyethyl Glycol-Propyl Glycol (SYSTANE OP) Apply 1 drop to eye every 4 (four) hours as needed (dry eyes).   Yes [provider]    Inpatient Medications: Scheduled Meds:  aspirin EC  325 mg Oral Daily   atorvastatin  40 mg Oral Daily   cholecalciferol  2,000 Units Oral Q1200   clopidogrel  75 mg Oral Daily   ferrous sulfate  325 mg Oral Q1400   furosemide  40 mg Intravenous Once   insulin aspart  0-15 Units Subcutaneous TID WC   insulin aspart  0-5 Units Subcutaneous QHS   insulin glargine-yfgn  20 Units Subcutaneous QHS   isosorbide mononitrate  15 mg Oral Daily    levothyroxine  100 mcg Oral QAC breakfast   metoprolol tartrate  12.5 mg Oral BID   multivitamin with minerals  1 tablet Oral Q1200   sodium chloride flush  10-40 mL Intracatheter Q12H   Continuous Infusions:  heparin 900 Units/hr (08/20/22 0247)   PRN Meds: acetaminophen, morphine injection, nitroGLYCERIN, ondansetron (ZOFRAN) IV, polyvinyl alcohol, sodium chloride flush  Allergies:   Allergies  Allergen Reactions   Shellfish Allergy Anaphylaxis    Throat swelling   Flexeril [Cyclobenzaprine] Hypertension    Social History:   Social History   Socioeconomic History   Marital status: Married    Spouse name: [name]    Number of children: 1   Years of education: Not on file   Highest education level: Not on file  Occupational History   Not on file  Tobacco Use   Smoking status: Never   Smokeless tobacco: Never  Vaping Use   Vaping Use: Never used  Substance and Sexual Activity   Alcohol use: No   Drug use: No   Sexual activity: Not on file  Other Topics Concern   Not on file  Social History Narrative   Lives in [place] with her husband.  She is responsible for driving and grocery shopping.   Social Determinants of Health   Financial Resource Strain: Low Risk  (10/30/2021)   Overall Financial Resource Strain (CARDIA)    Difficulty of Paying Living Expenses: Not hard at all  Food Insecurity: No Food Insecurity (10/30/2021)   Hunger Vital Sign    Worried About Running Out of Food in the Last Year: Never true    Ran Out of Food in the Last Year: Never true  Transportation Needs: No Transportation Needs (10/30/2021)   PRAPARE - Hydrologist (Medical): No    Lack of Transportation (Non-Medical): No  Physical Activity: Sufficiently Active (10/30/2021)   Exercise Vital Sign    Days of Exercise per Week: 4 days    Minutes of Exercise per Session: 40 min  Stress: No Stress Concern Present (10/30/2021)   [place]    Feeling of Stress : Not at all  Social Connections: [place] (10/30/2021)   Social Connection and Isolation Panel [NHANES]    Frequency of Communication with Friends and Family: More than three times a week    Frequency of Social Gatherings with Friends and Family: More than three times a week    Attends Religious Services: More than 4 times per year    Active Member of [employer] or Organizations: Yes    Attends Music therapist: More than 4 times per year    Marital Status: Married  Human resources officer Violence: Not At Risk (10/30/2021)   Humiliation, Afraid, Rape, and Kick questionnaire    Fear of Current or Ex-Partner: No    Emotionally Abused: No    Physically Abused: No    Sexually Abused: No     Family History:   Family History  Problem Relation Age of Onset   Breast cancer Maternal Aunt     ROS:  Review of Systems  Constitutional:  Positive for malaise/fatigue. Negative for chills, diaphoresis, fever and weight loss.  HENT:  Negative for congestion.   Eyes:  Negative for discharge and redness.  Respiratory:  Positive for shortness of breath. Negative for cough, sputum production and wheezing.   Cardiovascular:  Positive for chest pain and leg swelling. Negative for palpitations, orthopnea, claudication and PND.  Gastrointestinal:  Positive for nausea. Negative for abdominal pain, heartburn and vomiting.  Musculoskeletal:  Negative for falls and myalgias.  Skin:  Negative for rash.  Neurological:  Negative for dizziness, tingling, tremors, sensory change, speech change, focal weakness, loss of consciousness and weakness.  Endo/Heme/Allergies:  Does not bruise/bleed easily.  Psychiatric/Behavioral:  Negative for substance abuse. The patient is not nervous/anxious.   All other systems reviewed and are negative.     Physical Exam/Data:   Vitals:   08/19/22 2212 08/19/22 2233 08/19/22 2234 08/20/22 0419  BP:  97/62  (!) 92/52 102/68  Pulse: 89  88 93  Resp: '20  18 18  '$ Temp: 98 F (36.7 C)  97.8 F (36.6 C) 97.6 F (36.4 C)  TempSrc: Oral  SpO2: 100%  99% 99%  Weight:  74.4 kg    Height:  '5\' 8"'$  (1.727 m)      Intake/Output Summary (Last 24 hours) at 08/20/2022 0722 Last data filed at 08/20/2022 0247 Gross per 24 hour  Intake 157.67 ml  Output --  Net 157.67 ml   Filed Weights   09/10/2022 1850 08/19/22 2233  Weight: 77.8 kg 74.4 kg   Body mass index is 24.94 kg/m.   Physical Exam: General: Well developed, well nourished, in no acute distress. Head: Normocephalic, atraumatic, sclera non-icteric, no xanthomas, nares without discharge.  Neck: Negative for carotid bruits. JVD not elevated. Lungs: Crackles along the bilateral bases. Breathing is unlabored. Heart: RRR with S1 S2. No murmurs, rubs, or gallops appreciated. Abdomen: Soft, non-tender, non-distended with normoactive bowel sounds. No hepatomegaly. No rebound/guarding. No obvious abdominal masses. Msk:  Strength and tone appear normal for age. Extremities: No clubbing or cyanosis. 1+ bilateral lower extremity edema. Distal pedal pulses are 2+ and equal bilaterally. Neuro: Alert and oriented X 3. No facial asymmetry. No focal deficit. Moves all extremities spontaneously. Psych:  Responds to questions appropriately with a normal affect.   EKG:  The EKG was personally reviewed and demonstrates: sinus tachycardia, 106 bpm, bifascicular block Telemetry:  Telemetry was personally reviewed and demonstrates: SR, 80s to 90s bpm  Weights: Filed Weights   09/10/2022 1850 08/19/22 2233  Weight: 77.8 kg 74.4 kg    Relevant CV Studies:  LHC 08/13/2022:   Mid LM to Ost LAD lesion is 95% stenosed with 90% stenosed side branch in Ost Cx.   Ost LAD to Prox LAD lesion is 100% stenosed.   Mid LAD lesion is 80% stenosed with 100% stenosed side branch in 2nd Diag.   Ost Cx to Prox Cx lesion is 100% stenosed.   Dist Cx lesion is 70%  stenosed.   Prox RCA lesion is 65% stenosed.   LIMA graft was visualized by angiography and is normal in caliber. The graft exhibits no disease. Widely patent   SVG-D1 graft was visualized by angiography.  The graft exhibits no disease.  Widely patent; diagonal does not retrograde fill the LAD   SVG-OM graft was visualized by angiography and is normal in caliber.  The graft exhibits no disease.Widely patent to a very trivial downstream vessel.   --------------------   LV end diastolic pressure is mildly elevated.   There is no aortic valve stenosis.   --------------------   POSTOP DIAGNOSES Severe disease of the native left coronary artery system with distal Left Main 90%, severe disease in the small AV groove Circumflex with occlusion of the proximal LAD and large dominant OM with minimal competitive flow. Small caliber heavily calcified Right Coronary Artery with proximal 60 to 70% stenosis with explain the level of troponin elevation LVP 163/7 mmHg with Mildly elevated LVEDP 19 to 20 mmHg. Right Heart Cath numbers relatively stable:  RAP mean 4 mmHg, RVP-EDP 38/1-6 mmHg, PAP-mean 35/13-22 mmHg, PCWP mean 16 mmHg. Ao sat 95%, PA sat 69%; Cardiac Output-Index 8.25-4.42 (normal)   Despite significant elevation of troponin, there is no obvious culprit lesion to explain the patient's symptoms.      RECOMMENDATIONS Continue guideline directed medical therapy for CAD Discontinued IV heparin __________  2D echo 08/08/2022: 1. Left ventricular ejection fraction, by estimation, is 65 to 70%. The  left ventricle has normal function. The left ventricle has no regional  wall motion abnormalities. There is moderate asymmetric left ventricular  hypertrophy of the septal  segment.  Left ventricular diastolic parameters are consistent with Grade I  diastolic dysfunction (impaired relaxation). Elevated left ventricular  end-diastolic pressure.   2. Right ventricular systolic function is normal. The  right ventricular  size is normal. There is mildly elevated pulmonary artery systolic  pressure.   3. Left atrial size was mildly dilated.   4. The mitral valve is normal in structure. Trivial mitral valve  regurgitation. No evidence of mitral stenosis.   5. Tricuspid valve regurgitation is mild to moderate.   6. The aortic valve is tricuspid. Aortic valve regurgitation is not  visualized. No aortic stenosis is present.   7. Aortic dilatation noted. There is mild dilatation of the ascending  aorta, measuring 37 mm.   8. The inferior vena cava is dilated in size with <50% respiratory  variability, suggesting right atrial pressure of 15 mmHg.   Laboratory Data:  Chemistry Recent Labs  Lab 08/16/22 0457 09/03/2022 1743 08/19/22 0424  NA 139 130* 133*  K 3.7 4.4 4.7  CL 104 96* 99  CO2 28 22 21*  GLUCOSE 100* 438* 355*  BUN 23 22 25*  CREATININE 0.78 1.07* 1.06*  CALCIUM 8.2* 8.6* 7.8*  GFRNONAA >60 53* 53*  ANIONGAP '7 12 13    '$ No results for input(s): "PROT", "ALBUMIN", "AST", "ALT", "ALKPHOS", "BILITOT" in the last 168 hours. Hematology Recent Labs  Lab 08/14/22 0639 08/19/2022 1743 08/19/22 0424  WBC 7.0 9.5 12.1*  RBC 3.36* 3.52* 2.90*  HGB 9.8* 10.6* 8.7*  HCT 30.5* 32.9* 26.9*  MCV 90.8 93.5 92.8  MCH 29.2 30.1 30.0  MCHC 32.1 32.2 32.3  RDW 14.5 16.1* 16.2*  PLT 191 349 338   Cardiac EnzymesNo results for input(s): "TROPONINI" in the last 168 hours. No results for input(s): "TROPIPOC" in the last 168 hours.  BNP Recent Labs  Lab 08/19/22 1748  BNP 3,657.1*    DDimer No results for input(s): "DDIMER" in the last 168 hours.  Radiology/Studies:  [place] EKG SITE RITE  Result Date: 08/19/2022 If Site Rite image not attached, placement could not be confirmed due to current cardiac rhythm.  DG Chest Port 1 View  Result Date: 09/14/2022 IMPRESSION: No active disease. Electronically Signed   By: Marijo Conception M.D.   On: 09/01/2022 17:57    Assessment and Plan:    1. CAD s/p CABG with recent NSTEMI with elevated troponin: -Currently notes improved, though not resolved chest pain -Elevated troponin may be in the setting of down trending from prior NSTEMI, however cannot exclude new ischemic changes in the setting of recurrent symptoms vs volume overload with noted elevated BNP and evidence on exam -Obtain stat echo to evaluate LVSF and wall motion, if there are new changes concerning for ischemia, will plan for cardiac cath this afternoon -If echo is reassuring and she notes symptomatic improvement with diuresis, would likely pursue medical management  -ASA, Plavix for now with further recommendations pending work up given underlying Afib -Imdur, metoprolol  -NPO for now  2. Acute on chronic HFpEF: -IV Lasix 40 mg x 1, assess UOP -Monitor renal function -Stat echo -Escalate GDMT as indicated   3. PAF/pSVT: -Newly diagnosed during admission in late 07/2022 -Maintaining sinus rhythm currently -PTA Eliquis held, resume prior to discharge  -Heparin gtt -Metoprolol   4. AKI: -Renal function stable -Monitor  5. HLD: -LDL 62 -PTA Lipitor  6. Anemia: -Likely on chronic disease -Stable  7. DM with history of DKA: -Per IM    For  questions or updates, please contact [place] Please consult [email or web address] for contact info under Cardiology/STEMI.   Signed, Christell Faith, PA-C [place] Pager: [phone] 08/20/2022, 7:22 AM

## 2022-08-20 NOTE — Plan of Care (Incomplete)
218

## 2022-08-20 NOTE — TOC Initial Note (Signed)
Transition of Care [facility]) - Initial/Assessment Note    Patient Details  Name: Carrie Kane MRN: [id number] Date of Birth: [birth year]  Transition [facility]) CM/SW Contact:    Candie Chroman, LCSW Phone Number: 08/20/2022, 2:45 PM  Clinical Narrative:   CSW met with patient. No supports at bedside. CSW introduced role and explained that discharge planning would be discussed. Patient discharged to [facility] for rehab on 10/30 and readmitted on 11/1. She prefers to return home at discharge. Asked MD for PT/OT consults. Will follow up once recommendations are made.               Expected Discharge Plan: [place] Barriers to Discharge: Continued Medical Work up   Patient Goals and CMS Choice        Expected Discharge Plan and Services Expected Discharge Plan: [place] Acute Care Choice: [place] arrangements for the past 2 months: Single Family Home                                      Prior Living Arrangements/Services Living arrangements for the past 2 months: Single Family Home Lives with:: Spouse Patient language and need for interpreter reviewed:: Yes Do you feel safe going back to the place where you live?: Yes      Need for Family Participation in Patient Care: Yes (Comment) Care giver support system in place?: Yes (comment)   Criminal Activity/Legal Involvement Pertinent to Current Situation/Hospitalization: No - Comment as needed  Activities of Daily Living      Permission Sought/Granted                  Emotional Assessment Appearance:: Appears stated age Attitude/Demeanor/Rapport: Engaged Affect (typically observed): Appropriate, Calm Orientation: : Oriented to Self, Oriented to Place, Oriented to  Time, Oriented to Situation Alcohol / Substance Use: Not Applicable Psych Involvement: No (comment)  Admission diagnosis:  Unstable angina ([place]) [I20.0] Chest pain, rule out  acute myocardial infarction [R07.9] Patient Active Problem List   Diagnosis Date Noted   Pressure injury of skin 08/20/2022   Overweight (BMI 25.0-29.9) 08/19/2022   Chest pain, rule out acute myocardial infarction 09/08/2022   Dehydration    PAF (paroxysmal atrial fibrillation) ([place])    Diabetic ketoacidosis without coma associated with type 2 diabetes mellitus ([place])    Sepsis with acute renal failure and septic shock ([place])    Acute metabolic encephalopathy 65/53/7482   AKI (acute kidney injury) ([place])    Altered mental status    NSTEMI (non-ST elevated myocardial infarction) ([place])    Coronary artery disease involving native coronary artery of native heart with angina pectoris ([place]) 05/11/2022   Malnutrition of moderate degree 05/11/2022   Left displaced femoral neck fracture ([place]) 05/10/2022   Fall at home, initial encounter 05/10/2022   Preoperative clearance 05/10/2022   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) 05/10/2022   Essential hypertension 01/18/2021   Claudication ([place]) 12/23/2020   Hypothyroidism 12/23/2020   Anemia 06/17/2020   Weight loss 06/17/2020   Iron deficiency anemia due to chronic blood loss 06/17/2020   Abscess of left foot 06/12/2020   Pre-ulcerative calluses 04/03/2020   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) ([place]) 08/02/2017   Mixed hyperlipidemia 08/02/2017   CAD with Hx of CABG 08/01/2017   Type 2 diabetes mellitus without complication, with  long-term current use of insulin ([place]) 04/28/2017   Bruit 04/28/2017   Unstable angina ([place]) 04/28/2017   Abnormal stress test 04/28/2017   Chronic diastolic (congestive) heart failure ([place]) 04/28/2017   Acanthosis nigricans 01/26/2012   Disease of hair and hair follicles 08/81/1031   Hirsutism 01/26/2012   Local infection of skin and subcutaneous tissue 01/26/2012   PCP:  Cletis Athens, MD Pharmacy:   [place], [place] [address] Phone: [phone] Fax: [phone]  [id number]: [phone]: [phone]    Social Determinants of Health (SDOH) Interventions    Readmission Risk Interventions    08/13/2022   12:08 PM  Readmission Risk Prevention Plan  Transportation Screening Complete  HRI or Home Care Consult Complete  Palliative Care Screening Not Applicable  Medication Review (RN Care Manager) Complete

## 2022-08-20 NOTE — Progress Notes (Signed)
Pt has an order for PICC placement. Pt has lt upper arm Midline and lt ac piv. Per unit RN, will hold picc placement and follow up in am if pt still needs PICC access.

## 2022-08-20 NOTE — Consult Note (Addendum)
ANTICOAGULATION CONSULT NOTE  Pharmacy Consult for Heparin Infusion Indication: chest pain/ACS  Allergies  Allergen Reactions   Shellfish Allergy Anaphylaxis    Throat swelling   Flexeril [Cyclobenzaprine] Hypertension    Patient Measurements: Height: '5\' 8"'$  (172.7 cm) Weight: 74.4 kg (164 lb 0.4 oz) IBW/kg (Calculated) : 63.9 Heparin Dosing Weight: 77.8 kg  Vital Signs: Temp: 98.2 F (36.8 C) (11/03 0817) Temp Source: Oral (11/02 2212) BP: 95/63 (11/03 0817) Pulse Rate: 96 (11/03 0817)  Labs: Recent Labs     0000 09/04/2022 0424 08/21/2022 1743 09/08/2022 1831 09/10/2022 1831 09/01/2022 1958 08/19/22 0424 08/19/22 1400 08/19/22 2353 08/20/22 0730  HGB   < >  --  10.6*  --   --   --  8.7*  --   --  9.6*  HCT  --   --  32.9*  --   --   --  26.9*  --   --  29.3*  PLT  --   --  349  --   --   --  338  --   --  405*  APTT  --   --   --  32   < >  --  136* 30 67* 84*  LABPROT  --   --   --  15.6*  --   --   --   --   --   --   INR  --   --   --  1.3*  --   --   --   --   --   --   HEPARINUNFRC  --   --   --  >1.10*   < >  --  >1.10* >1.10*  --  >1.10*  CREATININE  --   --  1.07*  --   --   --  1.06*  --   --   --   CKMB  --   --   --   --   --  6.7*  --   --   --   --   TROPONINIHS  --  15,041* 1,975*  --   --  2,308*  --   --   --   --    < > = values in this interval not displayed.    Estimated Creatinine Clearance: 43.4 mL/min (A) (by C-G formula based on SCr of 1.06 mg/dL (H)).   Medical History: Past Medical History:  Diagnosis Date   CAD (coronary artery disease)    a. 04/2017 Cath: LM 32, LAD 80p, 40m LCX 95ost, 715mEF 45-50%; b. 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   Diastolic dysfunction    a. 04/2017 Echo: EF 55-60%, no rwma, Gr1 DD, mildly dil LA; b. 08/2020 Echo: EF 55-60%, no rwma, GrII DD, nl RV fxn, RVSP 3023m, mod dil LA, mildly dil RA, Mod TR.   Hyperlipidemia    Hypertensive heart disease    Insulin dependent diabetes mellitus    PAD  (peripheral artery disease) ([place]  a. 02/2021 PTA/DCBA R Peroneal, R Popliteal, distal R SFA.   PSVT (paroxysmal supraventricular tachycardia)    a. 02/2022 Zio: Predominantly sinus rhythm @ 61 (36-218). 2 NSVT runs (fastest/longest 6 beats @ 218). 37 SVT/A tach runs (fastest 185 x 5 beats, longest 32.4 secs @ 107). Triggered events = RSR, PAC.   [facility]  [facility]. 01/2022 R sided wkns/aphasia/tremor/slurred speech-->Ss resolved; b. 02/2022 MRI/A: punctate subacute inf vs artifact-post limb of L int  capsule. 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 Mod, chronic small vessel isch changes within the cerebral white matter.  Subcm chronic infarct- L cerebellar hemisphere.  Sev dzs prox P2 seg of R PCA. Mod dzs A1 R Clinical cytogeneticist.    Medications:  Scheduled:   aspirin EC  81 mg Oral Daily   atorvastatin  40 mg Oral Daily   cholecalciferol  2,000 Units Oral Q1200   clopidogrel  75 mg Oral Daily   ferrous sulfate  325 mg Oral Q1400   furosemide  40 mg Intravenous Once   insulin aspart  0-15 Units Subcutaneous TID WC   insulin aspart  0-5 Units Subcutaneous QHS   insulin glargine-yfgn  20 Units Subcutaneous QHS   isosorbide mononitrate  15 mg Oral Daily   levothyroxine  100 mcg Oral QAC breakfast   metoprolol tartrate  12.5 mg Oral BID   multivitamin with minerals  1 tablet Oral Q1200   sodium chloride flush  10-40 mL Intracatheter Q12H   Infusions:   heparin 900 Units/hr (08/20/22 0247)   PRN: acetaminophen, morphine injection, nitroGLYCERIN, ondansetron (ZOFRAN) IV, polyvinyl alcohol, sodium chloride flush  Assessment: Carrie Kane is a 81 y.o. female presenting with midsternal pain. PMH significant for CAD s/p CABG, PAD, HTN, HLD, DM. Patient was on [facility] PTA per chart review, last dose of apixaban reported to be 11/1 @ 0900. hsTrop in ED [phone], further work up pending. Pharmacy has been consulted to initiate and manage heparin infusion.   Baseline Labs 11/1:  aPTT 32, HL >1.10, PT 15.6, INR 1.3, Hgb 10.6, Hct 32.9, Plt 349   Goal of Therapy:  Heparin level 0.3-0.7 units/ml aPTT 66-102 seconds until HL & aPTT correlate Monitor platelets by anticoagulation protocol: Yes   Date Time aPTT HL Rate/Comment 11/1 1831 32 >1.10 Baseline 11/2 0424 136 >1.10 SUPRAtherapeutic 11/2 1400 30 >1.10 aPTT SUBtherapeutic, HL not correlatiing 11/2 2353 67 --- aPTT Therapeutic x 1 11/3 0730 84 >1.10 aPTT Therapeutic x2, HL not correlatiing  Plan:  Continue heparin infusion at 900 units/hr Check aPTT with AM labs Continue to monitor aPTT until HL and aPTT correlate.  Check HL daily for correlation until HL and aPTT correlate. Switch to HL monitoring once HL and aPTT correlate.  Follow up plan for triple therapy (ASA, plavix, OAC) upon transition to oral anticoagulant  Thank you for allowing pharmacy to be a part of this patient's care.   Gretel Acre, PharmD PGY1 Pharmacy Resident 08/20/2022 8:39 AM

## 2022-08-20 NOTE — Progress Notes (Signed)
[facility] Progress Note  Patient: Carrie Kane    ASN:[id number]  DOA: 08/22/2022    Date of Service: the patient was seen and examined on 08/20/2022  Brief hospital course: 81 year old female with past medical history of CAD with CABG, PAD, diabetes mellitus, hypertension and recent hospitalization from 10/22 - 10/30 for DKA with complications of non-STEMI and PE presented to the emergency room on morning of 11/1 with chest pain.  At time of admission, noted to have troponins around 15,000, although this was down from greater than 24,000 a week prior.  Cardiology consulted.  Troponins trended downward and by end of day down to 2300.  By following day, patient chest pain-free.  Following admission, BNP checked on evening of 11/2 (given previous history of diastolic heart failure) and found to be markedly elevated at 3600.  Patient seen by cardiology and started on IV Lasix.  Echocardiogram done 11/3 noted markedly depressed ejection fraction of 25 to 30% (as compared to echocardiogram done on 10/22.  Patient ordered more Lasix and cardiology plans to take patient for left and right heart catheterization on 11/6.  Assessment and Plan: Assessment and Plan: * Acute systolic (congestive) heart failure ([place]) Newly depressed ejection fraction in less than 2 weeks.  Unclear if this is from worsening decompensation of heart function versus read while volume overloaded.  Greatly appreciate cardiology help.  Continue diuresis.  Left heart catheterization on 11/6.  Chest pain, rule out acute myocardial infarction Initially chest pain resolved, but by afternoon of 11/3, had recurred with associated shortness of breath.  No new findings on EKG on admission.  Cardiology consulted and plan to take patient for heart catheterization on Monday.    Type 2 diabetes mellitus without complication, with long-term current use of insulin ([place]) Recent hospitalization for DKA.  CBGs have started to trend upward,  possibly in the setting of acute cardiac ischemia.  Increase coverage.  Mixed hyperlipidemia Continue statin  Malnutrition of moderate degree We will ask nutrition to reconsult  Hypothyroidism Continue Synthroid  Overweight (BMI 25.0-29.9) Meets criteria BMI greater than 25  Leukocytosis White blood cell count which was normal on admission up to 19.2 today.  Suspect reactive, possibly in the setting of acute cardiac ischemia versus CHF.  Checking procalcitonin.       Body mass index is 24.94 kg/m.    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 Subcutaneous fat may be visible but bone, tendon or muscle are NOT exposed. (Active)  08/20/22   Location: Buttocks  Location Orientation:   Staging: Stage 3 -  Full thickness tissue loss. Subcutaneous fat may be visible but bone, tendon or muscle are NOT exposed.  Wound Description (Comments):   Present on Admission: Yes     Consultants: Cardiology  Procedures: Planned left and right heart catheterization 11/6 Echocardiogram done 11/3 noting ejection fraction 25 to 30%, significantly worse compared to echocardiogram 10/22  Antimicrobials: None  Code Status: Full code   Subjective: Patient for was rough, complaining of chest pain and shortness of breath  Objective: Vital signs were reviewed and unremarkable. Vitals:   08/20/22 1206 08/20/22 1406  BP: (!) 94/51 90/60  Pulse: 78 81  Resp: 17   Temp: (!) 97.3 F (36.3 C)   SpO2: 97% 100%    Intake/Output Summary (Last 24 hours) at 08/20/2022 1548 Last data filed at 08/20/2022 0247 Gross per 24 hour  Intake 157.67 ml  Output --  Net 157.67 ml  Filed Weights   09/14/2022 1850 08/19/22 2233  Weight: 77.8 kg 74.4 kg   Body mass index is 24.94 kg/m.  Exam:  General: Alert and oriented x3, no acute distress HEENT: Normocephalic, atraumatic, mucous membranes are moist Cardiovascular: Regular rate and rhythm, S1-S2, 2 out of 6  systolic ejection murmur Respiratory: Clear to auscultation bilaterally Abdomen: Soft, nontender, nondistended, positive bowel sounds Musculoskeletal: No clubbing or cyanosis, trace pitting edema Skin: No skin breaks, tears or lesions Psychiatry: Patient is appropriate, no evidence of psychoses Neurology: No focal deficits  Data Reviewed: Leukocytosis now with white count of 19.2.  Disposition:  Status is: Inpatient Remains inpatient appropriate because: Awaiting further evaluation by cardiology    Anticipated discharge date: 11/7  Family Communication: We will call family DVT Prophylaxis: Heparin infusion    Author: Annita Brod ,MD 08/20/2022 3:48 PM  To reach On-call, see care teams to locate the attending and reach out via [email or web address]. Between 7PM-7AM, please contact night-coverage If you still have difficulty reaching the attending provider, please page the [facility] (Director on Call) for [facility] on amion for assistance.

## 2022-08-20 NOTE — Consult Note (Signed)
ANTICOAGULATION CONSULT NOTE  Pharmacy Consult for Heparin Indication: chest pain/ACS  Allergies  Allergen Reactions   Shellfish Allergy Anaphylaxis    Throat swelling   Flexeril [Cyclobenzaprine] Hypertension    Patient Measurements: Height: '5\' 8"'$  (172.7 cm) Weight: 74.4 kg (164 lb 0.4 oz) IBW/kg (Calculated) : 63.9 Heparin Dosing Weight: 77.8 kg  Vital Signs: Temp: 97.8 F (36.6 C) (11/02 2234) Temp Source: Oral (11/02 2212) BP: 92/52 (11/02 2234) Pulse Rate: 88 (11/02 2234)  Labs: Recent Labs    08/26/2022 0424 09/06/2022 1743 09/06/2022 1831 09/06/2022 1831 08/26/2022 1958 08/19/22 0424 08/19/22 1400 08/19/22 2353  HGB  --  10.6*  --   --   --  8.7*  --   --   HCT  --  32.9*  --   --   --  26.9*  --   --   PLT  --  349  --   --   --  338  --   --   APTT  --   --  32   < >  --  136* 30 67*  LABPROT  --   --  15.6*  --   --   --   --   --   INR  --   --  1.3*  --   --   --   --   --   HEPARINUNFRC  --   --  >1.10*  --   --  >1.10* >1.10*  --   CREATININE  --  1.07*  --   --   --  1.06*  --   --   CKMB  --   --   --   --  6.7*  --   --   --   TROPONINIHS 15,041* 9,211*  --   --  2,308*  --   --   --    < > = values in this interval not displayed.     Estimated Creatinine Clearance: 43.4 mL/min (A) (by C-G formula based on SCr of 1.06 mg/dL (H)).   Medical History: Past Medical History:  Diagnosis Date   CAD (coronary artery disease)    a. 04/2017 Cath: LM 48, LAD 80p, 66m LCX 95ost, 72mEF 45-50%; b. 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   Diastolic dysfunction    a. 04/2017 Echo: EF 55-60%, no rwma, Gr1 DD, mildly dil LA; b. 08/2020 Echo: EF 55-60%, no rwma, GrII DD, nl RV fxn, RVSP 3072m, mod dil LA, mildly dil RA, Mod TR.   Hyperlipidemia    Hypertensive heart disease    Insulin dependent diabetes mellitus    PAD (peripheral artery disease) ([place]  a. 02/2021 PTA/DCBA R Peroneal, R Popliteal, distal R SFA.   PSVT (paroxysmal supraventricular  tachycardia)    a. 02/2022 Zio: Predominantly sinus rhythm @ 61 (36-218). 2 NSVT runs (fastest/longest 6 beats @ 218). 37 SVT/A tach runs (fastest 185 x 5 beats, longest 32.4 secs @ 107). Triggered events = RSR, PAC.   [facility]  [facility]. 01/2022 R sided wkns/aphasia/tremor/slurred speech-->Ss resolved; b. 02/2022 MRI/A: punctate subacute inf vs artifact-post limb of L int capsule. 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 Mod, chronic small vessel isch changes within the cerebral white matter.  Subcm chronic infarct- L cerebellar hemisphere.  Sev dzs prox P2 seg of R PCA. Mod dzs A1 R antClinical cytogeneticist  Medications:  PTA Apixaban '5mg'$  PO BID (last dose 11/1 @  0900) Inpatient heparin infusion (11/1 >>>) Allergies: No AC/APT related allergies  Assessment: Carrie Kane is a 81 y.o. 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  Brought to ED woth reports of chest pain.  She rec'd ASA 325 and 2 nitro by EMS without relief. Troponin elevated X 2.  Pharmacy consulted to initiate heparin gtt.    Goal of Therapy:  Heparin level 0.3-0.7 units/ml Monitor platelets by anticoagulation protocol: Yes   Date Time aPTT/HL Rate/Comment 11/1 1831 32/ >1.10 Baseline 11/2 0424 136/>1.10 950 u/hr > 750 u/hr 11/2 1400 30/>1.10 750 u/hr > 900 u/hr 11/2 2353 67 / --  Therapeutic x 1  Plan:  Continue heparin infusion at 900 units/hr Recheck aPTT w/ AM labs to confirm Titrate by aPTT's until lab correlation is noted, then titrate by anti-xa alone. Continue to monitor H&H and platelets daily while on heparin gtt  Renda Rolls, PharmD, [facility] 08/20/2022 1:28 AM

## 2022-08-20 NOTE — Consult Note (Addendum)
[place] Nurse Consult Note: Reason for Consult: Consult requested for buttocks.  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  No odor, drainage, or fluctuance.  No topical treatment is indicated at this time. Dressing procedure/placement/frequency: Topical treatment orders provided for bedside nurses to perform to absorb drainage and promote healing as follows:  1.Apply piece of calcium alginate Kellie Simmering # [phone]) to buttock wound Q day, then cover with foam dressing.  (Change foam dressing Q 3 days or PRN soiling.) 2. Leave left foot callous dry and open to air Please re-consult if further assistance is needed.  Thank-you,  Julien Girt MSN, [place], [place], [place], [place]

## 2022-08-20 NOTE — Care Management Important Message (Signed)
Important Message  Patient Details  Name: Carrie Kane MRN: [id number] Date of Birth: 07/21/1941   Medicare Important Message Given:  Yes     Dannette Barbara 08/20/2022, 11:30 AM

## 2022-08-20 NOTE — Assessment & Plan Note (Signed)
White blood cell count which was normal on admission up to 19.2 today.  Suspect reactive, possibly in the setting of acute cardiac ischemia versus CHF.  Checking procalcitonin.

## 2022-08-20 NOTE — Assessment & Plan Note (Signed)
Continue statin. 

## 2022-08-20 NOTE — Progress Notes (Signed)
Spoke with Primary RN Vicente Males re: PICC order to follow up if patient still needs a PICC. Per primary RN, patient does not need it and has 2 working PIVs and will reach out to MD. Will follow up.

## 2022-08-20 NOTE — Assessment & Plan Note (Signed)
We will ask nutrition to reconsult

## 2022-08-21 ENCOUNTER — Other Ambulatory Visit: Payer: Self-pay

## 2022-08-21 DIAGNOSIS — E11649 Type 2 diabetes mellitus with hypoglycemia without coma: Secondary | ICD-10-CM | POA: Diagnosis not present

## 2022-08-21 DIAGNOSIS — E871 Hypo-osmolality and hyponatremia: Secondary | ICD-10-CM | POA: Insufficient documentation

## 2022-08-21 DIAGNOSIS — I5021 Acute systolic (congestive) heart failure: Secondary | ICD-10-CM | POA: Diagnosis not present

## 2022-08-21 DIAGNOSIS — E876 Hypokalemia: Secondary | ICD-10-CM | POA: Insufficient documentation

## 2022-08-21 DIAGNOSIS — I214 Non-ST elevation (NSTEMI) myocardial infarction: Secondary | ICD-10-CM

## 2022-08-21 DIAGNOSIS — Z794 Long term (current) use of insulin: Secondary | ICD-10-CM

## 2022-08-21 DIAGNOSIS — I429 Cardiomyopathy, unspecified: Secondary | ICD-10-CM

## 2022-08-21 LAB — BASIC METABOLIC PANEL
Anion gap: 7 (ref 5–15)
BUN: 29 mg/dL — ABNORMAL HIGH (ref 8–23)
CO2: 27 mmol/L (ref 22–32)
Calcium: 8.5 mg/dL — ABNORMAL LOW (ref 8.9–10.3)
Chloride: 102 mmol/L (ref 98–111)
Creatinine, Ser: 0.98 mg/dL (ref 0.44–1.00)
GFR, Estimated: 59 mL/min — ABNORMAL LOW (ref >60)
Glucose, Bld: 107 mg/dL — ABNORMAL HIGH (ref 70–99)
Potassium: 3.4 mmol/L — ABNORMAL LOW (ref 3.5–5.1)
Sodium: 136 mmol/L (ref 135–145)

## 2022-08-21 LAB — CBC
HCT: 26.1 % — ABNORMAL LOW (ref 36.0–46.0)
Hemoglobin: 8.6 g/dL — ABNORMAL LOW (ref 12.0–15.0)
MCH: 30 pg (ref 26.0–34.0)
MCHC: 33 g/dL (ref 30.0–36.0)
MCV: 90.9 fL (ref 80.0–100.0)
Platelets: 393 10*3/uL (ref 150–400)
RBC: 2.87 MIL/uL — ABNORMAL LOW (ref 3.87–5.11)
RDW: 17 % — ABNORMAL HIGH (ref 11.5–15.5)
WBC: 13.1 10*3/uL — ABNORMAL HIGH (ref 4.0–10.5)
nRBC: 0 % (ref 0.0–0.2)

## 2022-08-21 LAB — IRON AND TIBC
Iron: 15 ug/dL — ABNORMAL LOW (ref 28–170)
Saturation Ratios: 9 % — ABNORMAL LOW (ref 10.4–31.8)
TIBC: 172 ug/dL — ABNORMAL LOW (ref 250–450)
UIBC: 157 ug/dL

## 2022-08-21 LAB — GLUCOSE, CAPILLARY
Glucose-Capillary: 131 mg/dL — ABNORMAL HIGH (ref 70–99)
Glucose-Capillary: 24 mg/dL — CL (ref 70–99)
Glucose-Capillary: 95 mg/dL (ref 70–99)
Glucose-Capillary: 128 mg/dL — ABNORMAL HIGH (ref 70–99)
Glucose-Capillary: 213 mg/dL — ABNORMAL HIGH (ref 70–99)
Glucose-Capillary: 209 mg/dL — ABNORMAL HIGH (ref 70–99)

## 2022-08-21 LAB — HEPARIN LEVEL (UNFRACTIONATED): Heparin Unfractionated: >1.1 IU/mL — ABNORMAL HIGH (ref 0.30–0.70)

## 2022-08-21 LAB — VITAMIN B12: Vitamin B-12: 1895 pg/mL — ABNORMAL HIGH (ref 180–914)

## 2022-08-21 LAB — APTT: aPTT: 101 seconds — ABNORMAL HIGH (ref 24–36)

## 2022-08-21 LAB — FERRITIN: Ferritin: 443 ng/mL — ABNORMAL HIGH (ref 11–307)

## 2022-08-21 MED ORDER — ENSURE ENLIVE PO LIQD
237.0000 mL | Freq: Three times a day (TID) | ORAL | Status: DC
  Administered 2022-08-21 – 2022-08-22 (×4): 237 mL via ORAL

## 2022-08-21 MED ORDER — DEXTROSE 50 % IV SOLN
1.0000 | INTRAVENOUS | Status: AC
  Administered 2022-08-21: 50 mL via INTRAVENOUS
  Filled 2022-08-21: qty 50

## 2022-08-21 MED ORDER — FUROSEMIDE 10 MG/ML IJ SOLN
60.0000 mg | Freq: Two times a day (BID) | INTRAMUSCULAR | Status: DC
  Administered 2022-08-21 – 2022-08-22 (×2): 60 mg via INTRAVENOUS
  Filled 2022-08-21 (×2): qty 6

## 2022-08-21 MED ORDER — INSULIN ASPART 100 UNIT/ML IJ SOLN
0.0000 [IU] | Freq: Three times a day (TID) | INTRAMUSCULAR | Status: DC
  Administered 2022-08-21: 3 [IU] via SUBCUTANEOUS
  Administered 2022-08-22 (×2): 5 [IU] via SUBCUTANEOUS
  Administered 2022-08-22: 2 [IU] via SUBCUTANEOUS
  Filled 2022-08-21 (×4): qty 1

## 2022-08-21 MED ORDER — POTASSIUM CHLORIDE CRYS ER 20 MEQ PO TBCR
40.0000 meq | EXTENDED_RELEASE_TABLET | ORAL | Status: AC
  Administered 2022-08-21 (×2): 40 meq via ORAL
  Filled 2022-08-21 (×2): qty 2

## 2022-08-21 MED ORDER — DEXTROSE 10 % IV SOLN: INTRAVENOUS | Status: DC

## 2022-08-21 NOTE — Consult Note (Signed)
ANTICOAGULATION CONSULT NOTE  Pharmacy Consult for Heparin Infusion Indication: chest pain/ACS  Allergies  Allergen Reactions   Shellfish Allergy Anaphylaxis    Throat swelling   Flexeril [Cyclobenzaprine] Hypertension    Patient Measurements: Height: '5\' 8"'$  (172.7 cm) Weight: 77.1 kg (170 lb) IBW/kg (Calculated) : 63.9 Heparin Dosing Weight: 77.8 kg  Vital Signs: Temp: 96 F (35.6 C) (11/04 0550) Temp Source: Rectal (11/04 0550) BP: 105/56 (11/04 0550) Pulse Rate: 80 (11/04 0550)  Labs: Recent Labs    09/14/2022 1743 09/09/2022 1743 08/25/2022 1831 08/22/2022 1958 08/19/22 0424 08/19/22 1400 08/19/22 2353 08/20/22 0730 08/20/22 1805 08/21/22 0630  HGB 10.6*  --   --   --  8.7*  --   --  9.6*  --  8.6*  HCT 32.9*  --   --   --  26.9*  --   --  29.3*  --  26.1*  PLT 349  --   --   --  338  --   --  405*  --  393  APTT  --    < > 32  --  136* 30 67* 84*  --  101*  LABPROT  --   --  15.6*  --   --   --   --   --   --   --   INR  --   --  1.3*  --   --   --   --   --   --   --   HEPARINUNFRC  --    < > >1.10*  --  >1.10* >1.10*  --  >1.10*  --  >1.10*  CREATININE 1.07*  --   --   --  1.06*  --   --   --   --  0.98  CKMB  --   --   --  6.7*  --   --   --   --   --   --   TROPONINIHS 3,639*  --   --  2,308*  --   --   --   --  >24,000*  --    < > = values in this interval not displayed.     Estimated Creatinine Clearance: 50.9 mL/min (by C-G formula based on SCr of 0.98 mg/dL).   Medical History: Past Medical History:  Diagnosis Date   CAD (coronary artery disease)    a. 04/2017 Cath: LM 78, LAD 80p, 92m LCX 95ost, 75mEF 45-50%; b. 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   Diastolic dysfunction    a. 04/2017 Echo: EF 55-60%, no rwma, Gr1 DD, mildly dil LA; b. 08/2020 Echo: EF 55-60%, no rwma, GrII DD, nl RV fxn, RVSP 3037m, mod dil LA, mildly dil RA, Mod TR.   Hyperlipidemia    Hypertensive heart disease    Insulin dependent diabetes mellitus    PAD  (peripheral artery disease) ([facility]  a. 02/2021 PTA/DCBA R Peroneal, R Popliteal, distal R SFA.   PSVT (paroxysmal supraventricular tachycardia)    a. 02/2022 [facility]: Predominantly sinus rhythm @ 61 (36-218). 2 NSVT runs (fastest/longest 6 beats @ 218). 37 SVT/A tach runs (fastest 185 x 5 beats, longest 32.4 secs @ 107). Triggered events = RSR, PAC.   [facility] ([facility]  [facility]. 01/2022 R sided wkns/aphasia/tremor/slurred speech-->Ss resolved; b. 02/2022 MRI/A: punctate subacute inf vs artifact-post limb of L int capsule. 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 Mod, chronic small vessel isch changes within the  cerebral white matter.  Subcm chronic infarct- L cerebellar hemisphere.  Sev dzs prox P2 seg of R PCA. Mod dzs A1 R Clinical cytogeneticist.    Medications:  Scheduled:   aspirin EC  81 mg Oral Daily   atorvastatin  40 mg Oral Daily   cholecalciferol  2,000 Units Oral Q1200   clopidogrel  75 mg Oral Daily   ferrous sulfate  325 mg Oral Q1400   furosemide  40 mg Intravenous BID   insulin aspart  0-15 Units Subcutaneous TID WC   insulin aspart  0-5 Units Subcutaneous QHS   insulin glargine-yfgn  20 Units Subcutaneous QHS   isosorbide mononitrate  15 mg Oral Daily   levothyroxine  100 mcg Oral QAC breakfast   metoprolol tartrate  12.5 mg Oral BID   multivitamin with minerals  1 tablet Oral Q1200   sodium chloride flush  10-40 mL Intracatheter Q12H   Infusions:   dextrose 30 mL/hr at 08/21/22 0612   heparin 900 Units/hr (08/21/22 0419)   PRN: acetaminophen, morphine injection, nitroGLYCERIN, ondansetron (ZOFRAN) IV, polyvinyl alcohol, sodium chloride flush  Assessment: Carrie Kane is a 81 y.o. female presenting with midsternal pain. PMH significant for CAD s/p CABG, PAD, HTN, HLD, DM. Patient was on [facility] PTA per chart review, last dose of apixaban reported to be 11/1 @ 0900. hsTrop in ED [phone], further work up pending. Pharmacy has been consulted to initiate and manage heparin  infusion.   Baseline Labs 11/1: aPTT 32, HL >1.10, PT 15.6, INR 1.3, Hgb 10.6, Hct 32.9, Plt 349   Goal of Therapy:  Heparin level 0.3-0.7 units/ml aPTT 66-102 seconds until HL & aPTT correlate Monitor platelets by anticoagulation protocol: Yes   Date Time aPTT HL Rate/Comment 11/1 1831 32 >1.10 Baseline 11/2 0424 136 >1.10 SUPRAtherapeutic 11/2 1400 30 >1.10 aPTT SUBtherapeutic, HL not correlatiing 11/2 2353 67 --- aPTT Therapeutic x 1 11/3 0730 84 >1.10 aPTT Therapeutic x2, HL not correlatiing 11/4 0630 101 >1.10   aPTT therapeutic but upper end  Plan:  11/4  0630 aPTT=101 ,   HL>1.10   aPTT therapeutic but upper end Slightly adjust heparin infusion to 850 units/hr Check aPTT/HL with am labs Continue to monitor aPTT until HL and aPTT correlate.  Check HL daily for correlation until HL and aPTT correlate. Switch to HL monitoring once HL and aPTT correlate.  Follow up plan for triple therapy (ASA, plavix, OAC) upon transition to oral anticoagulant  Thank you for allowing pharmacy to be a part of this patient's care.   Chinita Greenland PharmD Clinical Pharmacist 08/21/2022

## 2022-08-21 NOTE — Plan of Care (Signed)
  Problem: Activity: Goal: Ability to return to baseline activity level will improve Outcome: Progressing   Problem: Cardiac: Goal: Ability to achieve and maintain adequate cardiopulmonary perfusion will improve Outcome: Progressing   Problem: Health Behavior/Discharge Planning: Goal: Ability to safely manage health-related needs after discharge will improve Outcome: Progressing   Problem: Metabolic: Goal: Ability to maintain appropriate glucose levels will improve Outcome: Progressing

## 2022-08-21 NOTE — Plan of Care (Signed)
  Problem: Activity: Goal: Ability to return to baseline activity level will improve Outcome: Progressing   Problem: Activity: Goal: Ability to tolerate increased activity will improve Outcome: Progressing   Problem: Metabolic: Goal: Ability to maintain appropriate glucose levels will improve Outcome: Progressing

## 2022-08-21 NOTE — Progress Notes (Signed)
Initial Nutrition Assessment  DOCUMENTATION CODES:   Not applicable  INTERVENTION:  Ensure Enlive po TID, each supplement provides 350 kcal and 20 grams of protein. (Strawberry or chocolate) MVI with minerals daily Monitor magnesium, potassium, and phosphorus BID for at least 3 days, MD to replete as needed, as pt is at risk for refeeding syndrome   NUTRITION DIAGNOSIS:   Increased nutrient needs related to acute illness as evidenced by estimated needs.  GOAL:   Patient will meet greater than or equal to 90% of their needs  MONITOR:   PO intake, Supplement acceptance, Labs, Weight trends  REASON FOR ASSESSMENT:   Consult Assessment of nutrition requirement/status  ASSESSMENT:   Pt admitted with chest pain 2/2 acute systolic CHF. PMH significant for CAD s/p CABG, PAD, DM, HTN, recently admitted 10/22-10/30 for DKA with complications of NSTEMI and PE.  Cardiology planning for [facility] on 11/6  No documented meal completions.   Pt admitted 10/22-10/30 and returned on 11/1. She was previously on tube feeding while intubated then advanced to a dysphagia 2 diet. She is not currently eating a modified textured diet but denies difficulty chewing or swallowing.   Spoke with pt via phone call to room. She states that she is not feeling well today d/t SOB. She states that she just started eating again today and has had breakfast and lunch. She is requesting Ensure supplements. Will place order as the added nutrition wound be beneficial for her d/t increased nutritional needs.   Reviewed weight history. No significant weight loss noted. Current weight 77.1 kg.   Edema: non-pitting generalized  Pt diagnosed with malnutrition during prior admission. This is likely an ongoing diagnosis d/t cardiac complications. Will assess at follow up.   Medications: Vitamin D3, ferrous sulfate, lasix, SSI 0-15 units TID< SSI 0-5 units qhs, semglee 20 units daily, MVI IV drips: D10 '@30ml'$ /hr  Labs:  potassium 3.4, BUN 29, GFR 59, CBG's 24-186 x24 hours   NUTRITION - FOCUSED PHYSICAL EXAM: RD working remotely. Deferred to follow up.   Diet Order:   Diet Order             Diet NPO time specified Except for: Sips with Meds  Diet effective midnight           Diet Heart Room service appropriate? Yes; Fluid consistency: Thin  Diet effective now                   EDUCATION NEEDS:   Education needs have been addressed  Skin:  Skin Assessment: Skin Integrity Issues: Skin Integrity Issues:: Stage III Stage III: buttocks  Last BM:  11/2  Height:   Ht Readings from Last 1 Encounters:  08/19/22 '5\' 8"'$  (1.727 m)    Weight:   Wt Readings from Last 1 Encounters:  08/21/22 77.1 kg   BMI:  Body mass index is 25.85 kg/m.  Estimated Nutritional Needs:   Kcal:  1700-1900  Protein:  90-100g  Fluid:  1.7-1.9L  Clayborne Dana, RDN, LDN Clinical Nutrition

## 2022-08-21 NOTE — Progress Notes (Signed)
Hypoglycemic Event  CBG: 24  Treatment: 8 oz juice/soda and D50 50 mL (25 gm)  Symptoms: Sweaty  Follow-up CBG: Time: 0620 CBG Result:131  Possible Reasons for Event: Inadequate meal intake and Medication regimen: Semglee  Comments/MD notified: Rachael Fee NP    [place]

## 2022-08-21 NOTE — Progress Notes (Signed)
Progress Note   Patient: Carrie Kane ZOX:[id number] DOB: Jul 06, 1941 DOA: 09/05/2022     3 DOS: the patient was seen and examined on 08/21/2022   Brief hospital course: 81 year old female with past medical history of CAD with CABG, PAD, diabetes mellitus, hypertension and recent hospitalization from 10/22 - 10/30 for DKA with complications of non-STEMI and PE presented to the emergency room on morning of 11/1 with chest pain.  At time of admission, noted to have troponins around 15,000, although this was down from greater than 24,000 a week prior.  Cardiology consulted.  Troponins trended downward and by end of day down to 2300.  By following day, patient chest pain-free.  Following admission, BNP checked on evening of 11/2 (given previous history of diastolic heart failure) and found to be markedly elevated at 3600.  Patient seen by cardiology and started on IV Lasix.  Echocardiogram done 11/3 noted markedly depressed ejection fraction of 25 to 30% (as compared to echocardiogram done on 10/22.  Patient ordered more Lasix and cardiology plans to take patient for left and right heart catheterization on 11/6.  Assessment and Plan: * Acute systolic (congestive) heart failure (HCC) Non-STEMI. Coronary artery disease status post CABG. Patient elevated troponin over 24,000 again in this admission.  Repeated echocardiogram showed ejection fraction 25 to 30% with mild to moderate tricuspid regurgitation. Reviewed heart cath results at the last admission, patient had multivessel disease, but  the coronary grafts are patent.  Patient is scheduled to have repeat angiogram on Monday.  Patient has been followed by cardiology, continue heparin drip and symptomatic treatment Patient continues to have significant volume overload, his renal function has normalized, but he has reduced urine output.  I will increase furosemide to 60 mg twice a day. Continue to monitor closely.  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  I will discontinue evening dose of insulin glargine.  Reduce sliding scale insulin dose. Continue D10 for now. Patient seems also has some hypothermia, will check cortisol level.  Hyponatremia Hypokalemia Replete potassium, sodium level is better.  Anemia. Check iron B12 level.  Recheck a CBC tomorrow.  Transfuse as needed.  Mixed hyperlipidemia Continue statin  Malnutrition of moderate degree We will ask nutrition to reconsult  Hypothyroidism Continue Synthroid  Overweight (BMI 25.0-29.9) Meets criteria BMI greater than 25  Leukocytosis Leukocytosis better today at 13.1.  But procalcitonin level 2.22.  Chest x-ray did not show any pneumonia.  Patient currently does not have any symptoms suggest infection.  I will obtain a UA to rule out UTI.  Hold antibiotics for now.       Subjective:  Patient still has some chest discomfort, feels like cannot take a deep breath.  Still on 2 L oxygen with good saturation.  Physical Exam: Vitals:   08/21/22 0825 08/21/22 1025 08/21/22 1206 08/21/22 1341  BP: 98/68 96/63 102/69 93/64  Pulse: 80 84 89 74  Resp: '19 16 18   '$ Temp: (!) 94.3 F (34.6 C)  (!) 95.5 F (35.3 C)   TempSrc: Rectal  Rectal   SpO2:  100% 98% 100%  Weight:      Height:       General exam: Appears calm and comfortable  Respiratory system: Crackles in the base. Respiratory effort normal. Cardiovascular system: S1 & S2 heard, RRR. No JVD, murmurs, rubs, gallops or clicks.  No pedal edema. Gastrointestinal system: Abdomen is nondistended, soft and nontender. No organomegaly or masses felt. Normal bowel sounds heard. Central nervous system: Alert and oriented x3. No focal neurological deficits. Extremities: Symmetric 5 x 5 power. Skin: No rashes, lesions or ulcers Psychiatry:  Mood & affect  appropriate.   Data Reviewed:  Chest x-ray reviewed, lab results reviewed.  Family Communication: could not reach husband  Disposition: Status is: Inpatient Remains inpatient appropriate because: severity of disease, IV treatment, pending inpatient procedure.  Planned Discharge Destination: Home with Home Health    Time spent: 55 minutes  Author: Sharen Hones, MD 08/21/2022 2:53 PM  For on call review [email or web address].

## 2022-08-21 NOTE — Progress Notes (Signed)
Progress Note  Patient Name: Carrie Kane Date of Encounter: 08/21/2022  Primary Cardiologist: Rockey Situ  Subjective   No further chest pain since admission. Dyspnea improving.  Documented urine output of 270 mL for the past 24 hours with a net -112 mL for the admission.  BUN trending from 25-29 with a stable to improved serum creatinine.  Potassium 3.4.  Hgb trending from 9.6-8.6 which is consistent with her readings in late October.  Inpatient Medications    Scheduled Meds:  aspirin EC  81 mg Oral Daily   atorvastatin  40 mg Oral Daily   cholecalciferol  2,000 Units Oral Q1200   clopidogrel  75 mg Oral Daily   ferrous sulfate  325 mg Oral Q1400   furosemide  40 mg Intravenous BID   insulin aspart  0-15 Units Subcutaneous TID WC   insulin aspart  0-5 Units Subcutaneous QHS   insulin glargine-yfgn  20 Units Subcutaneous QHS   isosorbide mononitrate  15 mg Oral Daily   levothyroxine  100 mcg Oral QAC breakfast   metoprolol tartrate  12.5 mg Oral BID   multivitamin with minerals  1 tablet Oral Q1200   sodium chloride flush  10-40 mL Intracatheter Q12H   Continuous Infusions:  dextrose 30 mL/hr at 08/21/22 0612   heparin 900 Units/hr (08/21/22 0419)   PRN Meds: acetaminophen, morphine injection, nitroGLYCERIN, ondansetron (ZOFRAN) IV, polyvinyl alcohol, sodium chloride flush   Vital Signs    Vitals:   08/21/22 0000 08/21/22 0416 08/21/22 0418 08/21/22 0550  BP: (!) 92/59 (!) 92/57  (!) 105/56  Pulse: 85 92  80  Resp: '18 19  16  '$ Temp: 98.1 F (36.7 C) 97.6 F (36.4 C)  (!) 96 F (35.6 C)  TempSrc:    Rectal  SpO2: 97% 100%  99%  Weight:   77.1 kg   Height:        Intake/Output Summary (Last 24 hours) at 08/21/2022 0712 Last data filed at 08/21/2022 0419 Gross per 24 hour  Intake 229.59 ml  Output 500 ml  Net -270.41 ml   Filed Weights   09/02/2022 1850 08/19/22 2233 08/21/22 0418  Weight: 77.8 kg 74.4 kg 77.1 kg    Telemetry    SR - Personally  Reviewed  ECG    No new tracings - Personally Reviewed  Physical Exam   GEN: No acute distress.   Neck: No JVD. Cardiac: RRR, no murmurs, rubs, or gallops.  Respiratory: Diminished breath sounds along the bases bilaterally with crackles along the lower third of both lung fields.  GI: Soft, nontender, non-distended.   MS: 1+ bilateral pretibial edema; No deformity. Neuro:  Alert and oriented x 3; Nonfocal.  Psych: Normal affect.  Labs    Chemistry Recent Labs  Lab 09/12/2022 1743 08/19/22 0424 08/21/22 0630  NA 130* 133* 136  K 4.4 4.7 3.4*  CL 96* 99 102  CO2 22 21* 27  GLUCOSE 438* 355* 107*  BUN 22 25* 29*  CREATININE 1.07* 1.06* 0.98  CALCIUM 8.6* 7.8* 8.5*  GFRNONAA 53* 53* 59*  ANIONGAP '12 13 7     '$ Hematology Recent Labs  Lab 08/19/22 0424 08/20/22 0730 08/21/22 0630  WBC 12.1* 19.2* 13.1*  RBC 2.90* 3.22* 2.87*  HGB 8.7* 9.6* 8.6*  HCT 26.9* 29.3* 26.1*  MCV 92.8 91.0 90.9  MCH 30.0 29.8 30.0  MCHC 32.3 32.8 33.0  RDW 16.2* 16.6* 17.0*  PLT 338 405* 393    Cardiac EnzymesNo results for input(s): "TROPONINI"  in the last 168 hours. No results for input(s): "TROPIPOC" in the last 168 hours.   BNP Recent Labs  Lab 08/19/22 1748  BNP 3,657.1*     DDimer No results for input(s): "DDIMER" in the last 168 hours.   Radiology    DG Chest Port 1 View   Result Date: 08/21/2022 IMPRESSION: No active disease. Electronically Signed   By: Marijo Conception M.D.   On: 09/06/2022 17:57  Cardiac Studies   Limited echo 08/20/2022: 1. Left ventricular ejection fraction, by estimation, is 25 to 30%. The  left ventricle has severely decreased function. The left ventricle  demonstrates regional wall motion abnormalities (global hypokinesis with  severe hypokinesis of the  anterior,anteroseptal and apical region).   2. Right ventricular systolic function is normal. The right ventricular  size is mildly enlarged. There is moderately elevated pulmonary artery   systolic pressure. The estimated right ventricular systolic pressure is  54.6 mmHg.   3. Left atrial size was moderately dilated.   4. Large pleural effusion in the left lateral region. Estimated at 7 cm   5. The mitral valve is normal in structure. No evidence of mitral valve  regurgitation. No evidence of mitral stenosis. Moderate mitral annular  calcification.   6. Tricuspid valve regurgitation is moderate to severe.   7. The aortic valve is tricuspid. Aortic valve regurgitation is not  visualized. No aortic stenosis is present.   8. The inferior vena cava is normal in size with <50% respiratory  variability, suggesting right atrial pressure of 8 mmHg.  __________  LHC 08/13/2022:   Mid LM to Ost LAD lesion is 95% stenosed with 90% stenosed side branch in Ost Cx.   Ost LAD to Prox LAD lesion is 100% stenosed.   Mid LAD lesion is 80% stenosed with 100% stenosed side branch in 2nd Diag.   Ost Cx to Prox Cx lesion is 100% stenosed.   Dist Cx lesion is 70% stenosed.   Prox RCA lesion is 65% stenosed.   LIMA graft was visualized by angiography and is normal in caliber. The graft exhibits no disease. Widely patent   SVG-D1 graft was visualized by angiography.  The graft exhibits no disease.  Widely patent; diagonal does not retrograde fill the LAD   SVG-OM graft was visualized by angiography and is normal in caliber.  The graft exhibits no disease.Widely patent to a very trivial downstream vessel.   --------------------   LV end diastolic pressure is mildly elevated.   There is no aortic valve stenosis.   --------------------   POSTOP DIAGNOSES Severe disease of the native left coronary artery system with distal Left Main 90%, severe disease in the small AV groove Circumflex with occlusion of the proximal LAD and large dominant OM with minimal competitive flow. Small caliber heavily calcified Right Coronary Artery with proximal 60 to 70% stenosis with explain the level of troponin  elevation LVP 163/7 mmHg with Mildly elevated LVEDP 19 to 20 mmHg. Right Heart Cath numbers relatively stable:  RAP mean 4 mmHg, RVP-EDP 38/1-6 mmHg, PAP-mean 35/13-22 mmHg, PCWP mean 16 mmHg. Ao sat 95%, PA sat 69%; Cardiac Output-Index 8.25-4.42 (normal)   Despite significant elevation of troponin, there is no obvious culprit lesion to explain the patient's symptoms.      RECOMMENDATIONS Continue guideline directed medical therapy for CAD Discontinued IV heparin __________   2D echo 08/08/2022: 1. Left ventricular ejection fraction, by estimation, is 65 to 70%. The  left ventricle has normal function. The  left ventricle has no regional  wall motion abnormalities. There is moderate asymmetric left ventricular  hypertrophy of the septal segment.  Left ventricular diastolic parameters are consistent with Grade I  diastolic dysfunction (impaired relaxation). Elevated left ventricular  end-diastolic pressure.   2. Right ventricular systolic function is normal. The right ventricular  size is normal. There is mildly elevated pulmonary artery systolic  pressure.   3. Left atrial size was mildly dilated.   4. The mitral valve is normal in structure. Trivial mitral valve  regurgitation. No evidence of mitral stenosis.   5. Tricuspid valve regurgitation is mild to moderate.   6. The aortic valve is tricuspid. Aortic valve regurgitation is not  visualized. No aortic stenosis is present.   7. Aortic dilatation noted. There is mild dilatation of the ascending  aorta, measuring 37 mm.   8. The inferior vena cava is dilated in size with <50% respiratory  variability, suggesting right atrial pressure of 15 mmHg.   Patient Profile     81 y.o. female with history of CAD s/p CABG with recent NSTEMI, HFpEF, PAF, SVT, CVA, PAD s/p PTA, DM with DKA, HTN, and HLD who is being seen today for the evaluation of elevated troponin and HFrEF at the request of Dr. [place] Pink.  Assessment & Plan    1. 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 Acute HFrEF with acute on chronic HFpEF: -Echo in 07/2022 during admission with NSTEMI demonstrated preserved LV systolic function with echo on 08/20/2022 showing a new cardiomyopathy with an EF of 25 to 30% with global hypokinesis with severe hypokinesis of the anterior, anteroseptal, and apical region -Relative hypotension precludes escalation of GDMT -IV Lasix 40 mg bid -Prior to discharge, if A-fib remains well controlled, would look to transition Lopressor to Toprol-XL or to carvedilol -We will pursue R/LHC early next week following diuresis   3. PAF/pSVT: -Newly diagnosed during admission in late 07/2022 -Maintaining sinus rhythm currently -PTA Eliquis held, resume prior to discharge  -Heparin gtt -Metoprolol    4. AKI: -Renal function largely stable -Monitor   5. HLD: -LDL 62 -PTA Lipitor   6. Anemia: -Likely on chronic disease -Stable   7. DM with history of DKA: -Per IM       For questions or updates, please contact [place] Please consult [email or web address] for contact info under Cardiology/STEMI.    Signed, Christell Faith, PA-C [place] Pager: [phone] 08/21/2022, 7:12 AM

## 2022-08-22 DIAGNOSIS — E875 Hyperkalemia: Secondary | ICD-10-CM | POA: Insufficient documentation

## 2022-08-22 DIAGNOSIS — I214 Non-ST elevation (NSTEMI) myocardial infarction: Secondary | ICD-10-CM | POA: Diagnosis not present

## 2022-08-22 DIAGNOSIS — I5021 Acute systolic (congestive) heart failure: Secondary | ICD-10-CM | POA: Diagnosis not present

## 2022-08-22 DIAGNOSIS — Z794 Long term (current) use of insulin: Secondary | ICD-10-CM | POA: Diagnosis not present

## 2022-08-22 DIAGNOSIS — E11649 Type 2 diabetes mellitus with hypoglycemia without coma: Secondary | ICD-10-CM | POA: Diagnosis not present

## 2022-08-22 LAB — CBC
HCT: 27.4 % — ABNORMAL LOW (ref 36.0–46.0)
Hemoglobin: 9 g/dL — ABNORMAL LOW (ref 12.0–15.0)
MCH: 30.3 pg (ref 26.0–34.0)
MCHC: 32.8 g/dL (ref 30.0–36.0)
MCV: 92.3 fL (ref 80.0–100.0)
Platelets: 396 10*3/uL (ref 150–400)
RBC: 2.97 MIL/uL — ABNORMAL LOW (ref 3.87–5.11)
RDW: 17.2 % — ABNORMAL HIGH (ref 11.5–15.5)
WBC: 8.8 10*3/uL (ref 4.0–10.5)
nRBC: 0 % (ref 0.0–0.2)

## 2022-08-22 LAB — URINALYSIS, COMPLETE (UACMP) WITH MICROSCOPIC
Bilirubin Urine: NEGATIVE
Glucose, UA: 50 mg/dL — AB
Hgb urine dipstick: NEGATIVE
Ketones, ur: NEGATIVE mg/dL
Nitrite: NEGATIVE
Protein, ur: NEGATIVE mg/dL
RBC / HPF: >50 RBC/hpf — ABNORMAL HIGH (ref 0–5)
Specific Gravity, Urine: 1.012 (ref 1.005–1.030)
pH: 5 (ref 5.0–8.0)

## 2022-08-22 LAB — BASIC METABOLIC PANEL
Anion gap: 6 (ref 5–15)
BUN: 29 mg/dL — ABNORMAL HIGH (ref 8–23)
CO2: 27 mmol/L (ref 22–32)
Calcium: 8.3 mg/dL — ABNORMAL LOW (ref 8.9–10.3)
Chloride: 100 mmol/L (ref 98–111)
Creatinine, Ser: 1.02 mg/dL — ABNORMAL HIGH (ref 0.44–1.00)
GFR, Estimated: 56 mL/min — ABNORMAL LOW (ref >60)
Glucose, Bld: 337 mg/dL — ABNORMAL HIGH (ref 70–99)
Potassium: 5.4 mmol/L — ABNORMAL HIGH (ref 3.5–5.1)
Sodium: 133 mmol/L — ABNORMAL LOW (ref 135–145)

## 2022-08-22 LAB — MAGNESIUM: Magnesium: 2.1 mg/dL (ref 1.7–2.4)

## 2022-08-22 LAB — GLUCOSE, CAPILLARY
Glucose-Capillary: 296 mg/dL — ABNORMAL HIGH (ref 70–99)
Glucose-Capillary: 196 mg/dL — ABNORMAL HIGH (ref 70–99)
Glucose-Capillary: 278 mg/dL — ABNORMAL HIGH (ref 70–99)
Glucose-Capillary: 268 mg/dL — ABNORMAL HIGH (ref 70–99)

## 2022-08-22 LAB — HEPARIN LEVEL (UNFRACTIONATED): Heparin Unfractionated: >1.1 IU/mL — ABNORMAL HIGH (ref 0.30–0.70)

## 2022-08-22 LAB — PHOSPHORUS: Phosphorus: 3 mg/dL (ref 2.5–4.6)

## 2022-08-22 LAB — POTASSIUM
Potassium: 5.5 mmol/L — ABNORMAL HIGH (ref 3.5–5.1)
Potassium: 5 mmol/L (ref 3.5–5.1)

## 2022-08-22 LAB — APTT: aPTT: 78 seconds — ABNORMAL HIGH (ref 24–36)

## 2022-08-22 LAB — CORTISOL-AM, BLOOD: Cortisol - AM: 20 ug/dL (ref 6.7–22.6)

## 2022-08-22 MED ORDER — SODIUM CHLORIDE 0.9% FLUSH: 3.0000 mL | INTRAVENOUS | Status: DC | PRN

## 2022-08-22 MED ORDER — ONDANSETRON HCL 4 MG/2ML IJ SOLN
4.0000 mg | INTRAMUSCULAR | Status: DC | PRN
  Administered 2022-08-22 – 2022-08-23 (×3): 4 mg via INTRAVENOUS
  Filled 2022-08-22 (×3): qty 2

## 2022-08-22 MED ORDER — SODIUM CHLORIDE 0.9 % IV SOLN: 250.0000 mL | INTRAVENOUS | Status: DC | PRN

## 2022-08-22 MED ORDER — SODIUM CHLORIDE 0.9% FLUSH: 3.0000 mL | Freq: Two times a day (BID) | INTRAVENOUS | Status: DC

## 2022-08-22 MED ORDER — SODIUM CHLORIDE 0.9 % IV SOLN: INTRAVENOUS | Status: DC

## 2022-08-22 MED ORDER — SODIUM ZIRCONIUM CYCLOSILICATE 5 G PO PACK
5.0000 g | PACK | Freq: Once | ORAL | Status: AC
  Administered 2022-08-22: 5 g via ORAL
  Filled 2022-08-22: qty 1

## 2022-08-22 MED ORDER — FUROSEMIDE 10 MG/ML IJ SOLN
40.0000 mg | Freq: Two times a day (BID) | INTRAMUSCULAR | Status: DC
  Administered 2022-08-22: 40 mg via INTRAVENOUS
  Filled 2022-08-22: qty 4

## 2022-08-22 MED ORDER — SODIUM CHLORIDE 0.9 % IV SOLN
1.0000 g | INTRAVENOUS | Status: DC
  Administered 2022-08-22: 1 g via INTRAVENOUS
  Filled 2022-08-22 (×2): qty 10

## 2022-08-22 MED ORDER — PANTOPRAZOLE SODIUM 40 MG PO TBEC
40.0000 mg | DELAYED_RELEASE_TABLET | Freq: Every day | ORAL | Status: DC
  Administered 2022-08-22: 40 mg via ORAL
  Filled 2022-08-22: qty 1

## 2022-08-22 NOTE — Progress Notes (Signed)
Progress Note  Patient Name: Carrie Kane Date of Encounter: 08/22/2022  Primary Cardiologist: Rockey Situ  Subjective   Family visiting at bedside today. No new concerns. Still with mild, difficult to describe upper chest discomfort but not limiting. Reviewed plans for cath tomorrow, she is amenable.  Inpatient Medications    Scheduled Meds:  aspirin EC  81 mg Oral Daily   atorvastatin  40 mg Oral Daily   cholecalciferol  2,000 Units Oral Q1200   clopidogrel  75 mg Oral Daily   feeding supplement  237 mL Oral TID BM   ferrous sulfate  325 mg Oral Q1400   furosemide  40 mg Intravenous BID   insulin aspart  0-9 Units Subcutaneous TID WC   isosorbide mononitrate  15 mg Oral Daily   levothyroxine  100 mcg Oral QAC breakfast   metoprolol tartrate  12.5 mg Oral BID   multivitamin with minerals  1 tablet Oral Q1200   sodium chloride flush  10-40 mL Intracatheter Q12H   sodium zirconium cyclosilicate  5 g Oral Once   Continuous Infusions:  heparin 850 Units/hr (08/22/22 0116)   PRN Meds: acetaminophen, morphine injection, nitroGLYCERIN, ondansetron (ZOFRAN) IV, polyvinyl alcohol, sodium chloride flush   Vital Signs    Vitals:   08/22/22 0520 08/22/22 0756 08/22/22 1008 08/22/22 1118  BP: '98/60 99/64 97/63 '$ (!) 98/59  Pulse: 75 74  77  Resp: '18 17  17  '$ Temp: 98 F (36.7 C) 98 F (36.7 C)  98 F (36.7 C)  TempSrc: Oral Oral  Oral  SpO2: 100% 100%  100%  Weight:      Height:        Intake/Output Summary (Last 24 hours) at 08/22/2022 1420 Last data filed at 08/22/2022 1202 Gross per 24 hour  Intake 1248.21 ml  Output 400 ml  Net 848.21 ml   Filed Weights   08/31/2022 1850 08/19/22 2233 08/21/22 0418  Weight: 77.8 kg 74.4 kg 77.1 kg    Telemetry    SR - Personally Reviewed  ECG    No new tracings - Personally Reviewed  Physical Exam   GEN: Well nourished, well developed in no acute distress NECK: No JVD CARDIAC: regular rhythm, normal S1 and S2, no rubs or  gallops. No murmur. VASCULAR: Radial pulses 2+ bilaterally.  RESPIRATORY:  Clear to auscultation without wheezing or rhonchi in upper fields, mildly diminished bilateral bases ABDOMEN: Soft, non-tender, non-distended MUSCULOSKELETAL:  Moves all 4 limbs independently SKIN: Warm and dry, 1+ bilateral LE edema NEUROLOGIC:  No focal neuro deficits noted. PSYCHIATRIC:  Normal affect    Labs    Chemistry Recent Labs  Lab 08/19/22 0424 08/21/22 0630 08/22/22 0627 08/22/22 0807  NA 133* 136 133*  --   K 4.7 3.4* 5.4* 5.5*  CL 99 102 100  --   CO2 21* 27 27  --   GLUCOSE 355* 107* 337*  --   BUN 25* 29* 29*  --   CREATININE 1.06* 0.98 1.02*  --   CALCIUM 7.8* 8.5* 8.3*  --   GFRNONAA 53* 59* 56*  --   ANIONGAP '13 7 6  '$ --      Hematology Recent Labs  Lab 08/20/22 0730 08/21/22 0630 08/22/22 0627  WBC 19.2* 13.1* 8.8  RBC 3.22* 2.87* 2.97*  HGB 9.6* 8.6* 9.0*  HCT 29.3* 26.1* 27.4*  MCV 91.0 90.9 92.3  MCH 29.8 30.0 30.3  MCHC 32.8 33.0 32.8  RDW 16.6* 17.0* 17.2*  PLT 405* 393 396  Cardiac EnzymesNo results for input(s): "TROPONINI" in the last 168 hours. No results for input(s): "TROPIPOC" in the last 168 hours.   BNP Recent Labs  Lab 08/19/22 1748  BNP 3,657.1*     DDimer No results for input(s): "DDIMER" in the last 168 hours.   Radiology    DG Chest Port 1 View   Result Date: 09/12/2022 IMPRESSION: No active disease. Electronically Signed   By: Marijo Conception M.D.   On: 08/20/2022 17:57  Cardiac Studies   Limited echo 08/20/2022: 1. Left ventricular ejection fraction, by estimation, is 25 to 30%. The  left ventricle has severely decreased function. The left ventricle  demonstrates regional wall motion abnormalities (global hypokinesis with  severe hypokinesis of the  anterior,anteroseptal and apical region).   2. Right ventricular systolic function is normal. The right ventricular  size is mildly enlarged. There is moderately elevated pulmonary  artery  systolic pressure. The estimated right ventricular systolic pressure is  76.1 mmHg.   3. Left atrial size was moderately dilated.   4. Large pleural effusion in the left lateral region. Estimated at 7 cm   5. The mitral valve is normal in structure. No evidence of mitral valve  regurgitation. No evidence of mitral stenosis. Moderate mitral annular  calcification.   6. Tricuspid valve regurgitation is moderate to severe.   7. The aortic valve is tricuspid. Aortic valve regurgitation is not  visualized. No aortic stenosis is present.   8. The inferior vena cava is normal in size with <50% respiratory  variability, suggesting right atrial pressure of 8 mmHg.  __________  LHC 08/13/2022:   Mid LM to Ost LAD lesion is 95% stenosed with 90% stenosed side branch in Ost Cx.   Ost LAD to Prox LAD lesion is 100% stenosed.   Mid LAD lesion is 80% stenosed with 100% stenosed side branch in 2nd Diag.   Ost Cx to Prox Cx lesion is 100% stenosed.   Dist Cx lesion is 70% stenosed.   Prox RCA lesion is 65% stenosed.   LIMA graft was visualized by angiography and is normal in caliber. The graft exhibits no disease. Widely patent   SVG-D1 graft was visualized by angiography.  The graft exhibits no disease.  Widely patent; diagonal does not retrograde fill the LAD   SVG-OM graft was visualized by angiography and is normal in caliber.  The graft exhibits no disease.Widely patent to a very trivial downstream vessel.   --------------------   LV end diastolic pressure is mildly elevated.   There is no aortic valve stenosis.   --------------------   POSTOP DIAGNOSES Severe disease of the native left coronary artery system with distal Left Main 90%, severe disease in the small AV groove Circumflex with occlusion of the proximal LAD and large dominant OM with minimal competitive flow. Small caliber heavily calcified Right Coronary Artery with proximal 60 to 70% stenosis with explain the level of  troponin elevation LVP 163/7 mmHg with Mildly elevated LVEDP 19 to 20 mmHg. Right Heart Cath numbers relatively stable:  RAP mean 4 mmHg, RVP-EDP 38/1-6 mmHg, PAP-mean 35/13-22 mmHg, PCWP mean 16 mmHg. Ao sat 95%, PA sat 69%; Cardiac Output-Index 8.25-4.42 (normal)   Despite significant elevation of troponin, there is no obvious culprit lesion to explain the patient's symptoms.      RECOMMENDATIONS Continue guideline directed medical therapy for CAD Discontinued IV heparin __________   2D echo 08/08/2022: 1. Left ventricular ejection fraction, by estimation, is 65 to 70%. The  left ventricle has normal function. The left ventricle has no regional  wall motion abnormalities. There is moderate asymmetric left ventricular  hypertrophy of the septal segment.  Left ventricular diastolic parameters are consistent with Grade I  diastolic dysfunction (impaired relaxation). Elevated left ventricular  end-diastolic pressure.   2. Right ventricular systolic function is normal. The right ventricular  size is normal. There is mildly elevated pulmonary artery systolic  pressure.   3. Left atrial size was mildly dilated.   4. The mitral valve is normal in structure. Trivial mitral valve  regurgitation. No evidence of mitral stenosis.   5. Tricuspid valve regurgitation is mild to moderate.   6. The aortic valve is tricuspid. Aortic valve regurgitation is not  visualized. No aortic stenosis is present.   7. Aortic dilatation noted. There is mild dilatation of the ascending  aorta, measuring 37 mm.   8. The inferior vena cava is dilated in size with <50% respiratory  variability, suggesting right atrial pressure of 15 mmHg.   Patient Profile     81 y.o. female with history of CAD s/p CABG with recent NSTEMI, HFpEF, PAF, SVT, CVA, PAD s/p PTA, DM with DKA, HTN, and HLD who is being seen today for the evaluation of elevated troponin and HFrEF at the request of Dr. Maryland Pink.  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  -hsTn >24,000 -continue aspirin, clopidogrel for now. After cath, will need to determine plan for antiplatelet/anticoagulation given atrial fibrillation -continue heparin -continue atorvastatin -continue lasix 40 mg IV BID. I/O not well documented. -tolerating imdur. BP borderline hypotensive, no room for additional GDMT at this time   PAF/pSVT: -Newly diagnosed during admission in late 07/2022 -Maintaining sinus rhythm currently -PTA Eliquis held, resume prior to discharge  -Heparin gtt -Metoprolol    HLD: -LDL 62 -PTA Lipitor   Anemia: -Likely on chronic disease -Stable   DM with history of DKA: -Per IM -consider SGLT2i prior to discharge if renal function remains stable    For questions or updates, please contact [facility] Please consult [email or web address] for contact info under Cardiology/STEMI.    Signed, Buford Dresser, MD, PhD, [facility] at [facility] at [address] [facility] [address] [place], [place] [address] [phone]  08/22/2022, 2:20 PM

## 2022-08-22 NOTE — Progress Notes (Signed)
Progress Note   Patient: Carrie Kane GYJ:[id number] DOB: 1941/02/28 DOA: 08/30/2022     4 DOS: the patient was seen and examined on 08/22/2022   Brief hospital course: 81 year old female with past medical history of CAD with CABG, PAD, diabetes mellitus, hypertension and recent hospitalization from 10/22 - 10/30 for DKA with complications of non-STEMI and PE presented to the emergency room on morning of 11/1 with chest pain.  At time of admission, noted to have troponins around 15,000, although this was down from greater than 24,000 a week prior.  Cardiology consulted.  Troponins trended downward and by end of day down to 2300.  By following day, patient chest pain-free.  Following admission, BNP checked on evening of 11/2 (given previous history of diastolic heart failure) and found to be markedly elevated at 3600.  Patient seen by cardiology and started on IV Lasix.  Echocardiogram done 11/3 noted markedly depressed ejection fraction of 25 to 30% (as compared to echocardiogram done on 10/22.  Patient ordered more Lasix and cardiology plans to take patient for left and right heart catheterization on 11/6.  Assessment and Plan:  Acute systolic (congestive) heart failure (HCC) Non-STEMI. Coronary artery disease status post CABG. Patient elevated troponin over 24,000 again in this admission.  Repeated echocardiogram showed ejection fraction 25 to 30% with mild to moderate tricuspid regurgitation. Reviewed heart cath results at the last admission, patient had multivessel disease, but  the coronary grafts are patent.  Patient is scheduled to have repeat angiogram on Monday.  Patient has been followed by cardiology, continue heparin drip and symptomatic treatment Patient had significant diuresis, but she is incontinent, urine output not recorded.  Patient feel better today.  Furosemide was reduced to 40 mg twice a day again.  Monitor renal function closely.  I also will anchor a Foley catheter to  closely monitor urine output.   Uncontrolled type 2 diabetes mellitus with hypoglycemia, with long-term current use of insulin ([place]) Patient had a severe hypoglycemia happened this morning, with glucose 24, started on 10% D5 at 30 mL/h.  I will discontinue evening dose of insulin glargine.  Reduce sliding scale insulin dose. Patient glucose is running high again, discontinue D10.  Continue sliding scale insulin.   Hyponatremia Hypokalemia Hyperkalemia. Patient potassium increased to 5.5 today with mild worsening renal function.  Give a dose of Lokelma, recheck potassium.   Anemia of chronic disease. Anemia of chronic disease.  Hemoglobin still stable.   Mixed hyperlipidemia Continue statin   Malnutrition of moderate degree We will ask nutrition to reconsult   Hypothyroidism Continue Synthroid   Overweight (BMI 25.0-29.9) Meets criteria BMI greater than 25   Leukocytosis Likely UTI. Patient has leukocytosis, elevated procalcitonin level, now incontinent of urine.  Likely UTI, Foley catheter anchored today and send out UA.  We will start Rocephin for now for 3 days.        Subjective:  Patient feels better today, short of breath much improved since yesterday.  She had incontinent of urine, she had a large urine output, soaked the entire bed.  Physical Exam: Vitals:   08/22/22 0520 08/22/22 0756 08/22/22 1008 08/22/22 1118  BP: '98/60 99/64 97/63 '$ (!) 98/59  Pulse: 75 74  77  Resp: '18 17  17  '$ Temp: 98 F (36.7 C) 98 F (36.7 C)  98 F (36.7 C)  TempSrc: Oral Oral  Oral  SpO2: 100% 100%  100%  Weight:      Height:  General exam: Appears calm and comfortable  Respiratory system: Crackles in the bases bilaterally.Marland Kitchen Respiratory effort normal. Cardiovascular system: S1 & S2 heard, RRR. No JVD, murmurs, rubs, gallops or clicks. No pedal edema. Gastrointestinal system: Abdomen is nondistended, soft and nontender. No organomegaly or masses felt. Normal bowel sounds  heard. Central nervous system: Alert and oriented. No focal neurological deficits. Extremities: Symmetric 5 x 5 power. Skin: No rashes, lesions or ulcers Psychiatry: Judgement and insight appear normal. Mood & affect appropriate.   Data Reviewed:  Lab results reviewed.  Family Communication: Daughter updated at bedside.  Disposition: Status is: Inpatient Remains inpatient appropriate because: Severity of disease, IV treatment, pending procedure.  Planned Discharge Destination: Home with Home Health    Time spent: 35 minutes  Author: Sharen Hones, MD 08/22/2022 2:40 PM  For on call review [email or web address].

## 2022-08-22 NOTE — Consult Note (Signed)
ANTICOAGULATION CONSULT NOTE  Pharmacy Consult for Heparin Infusion Indication: chest pain/ACS  Allergies  Allergen Reactions   Shellfish Allergy Anaphylaxis    Throat swelling   Flexeril [Cyclobenzaprine] Hypertension    Patient Measurements: Height: '5\' 8"'$  (172.7 cm) Weight: 77.1 kg (170 lb) IBW/kg (Calculated) : 63.9 Heparin Dosing Weight: 77.8 kg  Vital Signs: Temp: 98 F (36.7 C) (11/05 0520) Temp Source: Oral (11/05 0520) BP: 98/60 (11/05 0520) Pulse Rate: 75 (11/05 0520)  Labs: Recent Labs    08/20/22 0730 08/20/22 1805 08/21/22 0630 08/22/22 0627  HGB 9.6*  --  8.6* 9.0*  HCT 29.3*  --  26.1* 27.4*  PLT 405*  --  393 396  APTT 84*  --  101* 78*  HEPARINUNFRC >1.10*  --  >1.10* >1.10*  CREATININE  --   --  0.98 1.02*  TROPONINIHS  --  >24,000*  --   --      Estimated Creatinine Clearance: 48.9 mL/min (A) (by C-G formula based on SCr of 1.02 mg/dL (H)).   Medical History: Past Medical History:  Diagnosis Date   CAD (coronary artery disease)    a. 04/2017 Cath: LM 22, LAD 80p, 30m LCX 95ost, 78mEF 45-50%; b. 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   Diastolic dysfunction    a. 04/2017 Echo: EF 55-60%, no rwma, Gr1 DD, mildly dil LA; b. 08/2020 Echo: EF 55-60%, no rwma, GrII DD, nl RV fxn, RVSP 304m, mod dil LA, mildly dil RA, Mod TR.   Hyperlipidemia    Hypertensive heart disease    Insulin dependent diabetes mellitus    [place] artery disease) [place]  a. 02/2021 PTA/DCBA R Peroneal, R Popliteal, distal R SFA.   PSVT (paroxysmal supraventricular tachycardia)    a. 02/2022 Zio: Predominantly sinus rhythm @ 61 (36-218). 2 NSVT runs (fastest/longest 6 beats @ 218). 37 SVT/A tach runs (fastest 185 x 5 beats, longest 32.4 secs @ 107). Triggered events = RSR, PAC.   [facility]  a. 01/2022 R sided wkns/aphasia/tremor/slurred speech-->Ss resolved; b. 02/2022 MRI/A: punctate subacute inf vs artifact-post limb of L int capsule. 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 Mod, chronic small vessel isch changes within the cerebral white matter.  Subcm chronic infarct- L cerebellar hemisphere.  Sev dzs prox P2 seg of R PCA. Mod dzs A1 R antClinical cytogeneticist  Medications:  Scheduled:   aspirin EC  81 mg Oral Daily   atorvastatin  40 mg Oral Daily   cholecalciferol  2,000 Units Oral Q1200   clopidogrel  75 mg Oral Daily   feeding supplement  237 mL Oral TID BM   ferrous sulfate  325 mg Oral Q1400   furosemide  60 mg Intravenous BID   insulin aspart  0-9 Units Subcutaneous TID WC   isosorbide mononitrate  15 mg Oral Daily   levothyroxine  100 mcg Oral QAC breakfast   metoprolol tartrate  12.5 mg Oral BID   multivitamin with minerals  1 tablet Oral Q1200   sodium chloride flush  10-40 mL Intracatheter Q12H   Infusions:   dextrose 30 mL/hr at 08/21/22 2341   heparin 850 Units/hr (08/22/22 0116)   PRN: acetaminophen, morphine injection, nitroGLYCERIN, ondansetron (ZOFRAN) IV, polyvinyl alcohol, sodium chloride flush  Assessment: Carrie Kane a 81 59o. female presenting with midsternal pain. PMH significant for CAD s/p CABG, PAD, HTN, HLD, DM. [facility] review, last dose of apixaban  reported to be 11/1 @ 0900. hsTrop in ED [phone], further work up pending. Pharmacy has been consulted to initiate and manage heparin infusion.   Baseline Labs 11/1: aPTT 32, HL >1.10, PT 15.6, INR 1.3, Hgb 10.6, Hct 32.9, Plt 349   Goal of Therapy:  Heparin level 0.3-0.7 units/ml aPTT 66-102 seconds until HL & aPTT correlate Monitor platelets by anticoagulation protocol: Yes   Date Time aPTT HL Rate/Comment 11/1 1831 32 >1.10 Baseline 11/2 0424 136 >1.10 SUPRAtherapeutic 11/2 1400 30 >1.10 aPTT SUBtherapeutic, HL not correlatiing 11/2 2353 67 --- aPTT Therapeutic x 1 11/3 0730 84 >1.10 aPTT Therapeutic x2, HL not correlatiing 11/4 0630 101 >1.10   aPTT therapeutic but upper end-adj from 900 to 850  u/hr 11/5 0627 78 >1.10 aPTT therapeutic  Plan:  11/5  0627  aPTT=78 HL= >1.10 aPTT therapeutic continue heparin infusion at 850 units/hr Check aPTT/HL with am labs Continue to monitor aPTT until HL and aPTT correlate.  Check HL daily for correlation until HL and aPTT correlate. Switch to HL monitoring once HL and aPTT correlate.  Follow up plan for triple therapy (ASA, plavix, OAC) upon transition to oral anticoagulant  Thank you for allowing pharmacy to be a part of this patient's care.   Chinita Greenland PharmD Clinical Pharmacist 08/22/2022

## 2022-08-22 NOTE — Progress Notes (Signed)
Responded to call from pt, pt states "I am not feeling good and it is going fast...".  Asked the pt what she meant, pt said 'I don't know, I just feel so nauseous."  BP 97/55, HR 86, saturation 99% on 3L [place].  Dr. Roosevelt Locks made aware, prn ondansetron not due until 1800H.  Pt. needs addressed.

## 2022-08-23 ENCOUNTER — Encounter: Payer: Self-pay | Admitting: Nurse Practitioner

## 2022-08-23 DIAGNOSIS — I469 Cardiac arrest, cause unspecified: Secondary | ICD-10-CM

## 2022-08-23 DIAGNOSIS — I5021 Acute systolic (congestive) heart failure: Secondary | ICD-10-CM | POA: Diagnosis not present

## 2022-08-23 DIAGNOSIS — I214 Non-ST elevation (NSTEMI) myocardial infarction: Secondary | ICD-10-CM | POA: Diagnosis not present

## 2022-08-23 LAB — GLUCOSE, CAPILLARY: Glucose-Capillary: 377 mg/dL — ABNORMAL HIGH (ref 70–99)

## 2022-08-23 SURGERY — RIGHT/LEFT HEART CATH AND CORONARY/GRAFT ANGIOGRAPHY
Anesthesia: Moderate Sedation

## 2022-08-23 MED ORDER — AMIODARONE IV BOLUS ONLY 150 MG/100ML
INTRAVENOUS | Status: AC
  Filled 2022-08-23: qty 100

## 2022-08-23 MED ORDER — EPINEPHRINE HCL 5 MG/250ML IV SOLN IN NS
0.5000 ug/min | INTRAVENOUS | Status: DC
  Filled 2022-08-23: qty 250

## 2022-09-06 ENCOUNTER — Ambulatory Visit: Payer: Medicare Other | Admitting: Cardiovascular Disease

## 2022-09-17 NOTE — Significant Event (Signed)
CODE BLUE NOTE  Patient Name: Carrie Kane   MRN: [id number]   Date of Birth/ Sex: Aug 07, 1941 , female      Admission Date: 08/24/2022  Attending Provider: Sharen Hones, MD  Primary Diagnosis: Acute systolic (congestive) heart failure [facility])     Cardiopulmonary Resuscitation Directed by: Carrie Glass DNP, FNP-BC  I personally directed ancillary staff and/or performed CPR in an effort to regain return of spontaneous circulation. ED MD, PCCM NP, and [facility] MD present in room.  Indication: Pt was in her usual state of health until this PM, when she was noted to be asystolic. Code blue was subsequently called at 0241. At the time of arrival on scene, ACLS protocol was underway. Carrie Kane was subsequently intubated by EDP Dr Carrie Kane.   Carrie Kane is 81 y.o. female with a known history of NSTEMI and PE, d/c'd on 10/30, CAD with CABGx3, PAD s/p revas in 22, PSVT, CVA 5/23, anemia, HTN, HLD, DM, hypothyroidism who presented to [facility] ED on 11/2 after onset of midsternal chest pain at rest, while watching TV.  She has had ongoing chest pain this admission and was scheduled for cardiac cath tomorrow. BNP 3600+ on admission and she has been diuresed with IV lasix.   Technical Description:  - CPR performance duration:  8 minute  - Was defibrillation or cardioversion used? No   - Was external pacer placed? No  - Was patient intubated pre/post CPR? Yes    Medications Administered: Y = Yes; Blank = No Amiodarone    Atropine    Calcium  Y  Epinephrine  Y x2  Lidocaine    Magnesium    Norepinephrine    Phenylephrine    Sodium bicarbonate  Y x2  Vasopressin      Post CPR evaluation:  - Final Status - Was patient successfully resuscitated ? Yes at 0249 and moved to ICU where she again lost a pulse - What is current hemodynamic status? Hemodynamically unstable on transfer to ICU   Miscellaneous Information:  - Labs sent, including: No   - Additional notes/ transfer status:     On  arrival to ICU, patient lost a pulse and CPR was resumed, please see PCCM or [facility] MD Code Note for additional details  - Family Notified? Yes via phone, husband Carrie Kane elected to stop resuscitative efforts. Time of Death 87.          This document was prepared using Dragon voice recognition software and may include unintentional dictation errors.  Carrie Glass DNP, MBA, FNP-BC Nurse Practitioner [facility] Pager [phone]

## 2022-09-17 NOTE — ED Provider Notes (Signed)
Emergency department code blue note  Called to patient's bedside for CODE BLUE and found patient being bagged with BVM and hypoxic.  Patient was intubated as per procedure note below.  After procedure performed, patient's care was continued and code was run by the internal medicine team at bedside. Procedure Name: Intubation Date/Time: Sep 05, 2022 3:51 AM  Performed by: [name], MDPre-anesthesia Checklist: Patient identified, Patient being monitored, Emergency Drugs available, Timeout performed and Suction available Oxygen Delivery Method: Non-rebreather mask Preoxygenation: Pre-oxygenation with 100% oxygen Induction Type: Rapid sequence Ventilation: Mask ventilation without difficulty Laryngoscope Size: Glidescope Grade View: Grade I Tube size: 7.5 mm Number of attempts: 1 Airway Equipment and Method: Video-laryngoscopy Placement Confirmation: ETT inserted through vocal cords under direct vision, CO2 detector and Breath sounds checked- equal and bilateral Secured at: 25 cm Tube secured with: ETT holder Dental Injury: Teeth and Oropharynx as per pre-operative assessment      CRITICAL CARE Performed by: [name]  Total critical care time: 15 minutes  Critical care time was exclusive of separately billable procedures and treating other patients.  Critical care was necessary to treat or prevent imminent or life-threatening deterioration.  Critical care was time spent personally by me on the following activities: development of treatment plan with patient and/or surrogate as well as nursing, discussions with consultants, evaluation of patient's response to treatment, examination of patient, obtaining history from patient or surrogate, ordering and performing treatments and interventions, ordering and review of laboratory studies, ordering and review of radiographic studies, pulse oximetry and re-evaluation of patient's condition.    [name], MD 09/05/22 [phone]

## 2022-09-17 NOTE — Progress Notes (Signed)
Chaplain responded to Code Blue, pt is receiving medical care, no family is present.  Please contact as needed for support.    [place], [place] Pager:  [phone]    09-08-2022 0300  Clinical Encounter Type  Visited With Patient not available  Visit Type Code  Referral From Nurse  Consult/Referral To Chaplain  Stress Factors  Patient Stress Factors Health changes

## 2022-09-17 NOTE — Progress Notes (Signed)
CODE BLUE note:  Date of note 2022-09-16  CODE BLUE was called at 02 41 on 09-16-22 when the patient was found unresponsive and went into cardiac respiratory arrest with asystole on monitor.  CPR was immediately started.  The patient was intubated by Dr. Cheri Fowler.  She received 1 mg of IV epi, an amp of sodium bicarbonate twice and 1 amp of calcium gluconate 1 g IV.  Wide open saline was provided.  She regained her pulse at 02 49 and was transferred to the ICU.

## 2022-09-17 NOTE — Progress Notes (Signed)
0232 - Pt received PRN IV morphine for ongoing chest pain. Pt wanted to sit on the side of the bed for a while to get comfortable, but couldn't tolerate it due to "feeling too bad." Primary nurse positioned the patient back in bed. Safety measures in place.  Approx.   [place] called about patient heart rhythm reading asystole. Upon primary nurse entering room to check on patient, pt was grabbing chest, took a few breaths, and then eyes rolled back. Primary nurse then checked for a pulse and confirmed pt to be pulseless, initiated chest compressions, and pressed the code blue button. Unit staff, providers, and response teams came to bedside. Approx.   0249 - ROSC. Pt transferred to ICU.

## 2022-09-17 NOTE — Progress Notes (Signed)
CARDIOPULMONARY RESUSCITATION  Brief Synopsis:    81 y.o. female with history of CAD s/p CABG with recent NSTEMI, HFpEF, PAF, SVT, CVA, PAD s/p PTA, DM with DKA, HTN, and HLD who presented with chest pain and admitted to PCU with NSTEMI, Acute combined systolic and diastolic heart failure, New cardiomyopathy, Paroxysmal atrial fibrillation. While undergoing medical treatment, patient went into PEA cardia arrest and CODE blue initiated. Patient was briefly resuscitated with ROSC after multiple rounds of CPR/ACLS she was intubated and transferred to the ICU ( see separate code sheet for medications administered prior to transfer to the  ICU). In the ICU, patient lost pulses  again and CPR/ACLS initiated as below.  Initial rhythm: PEA CPR performance duration: 15 minutes Was defibrillation or cardioversion used ? x2 Was external pacer placed ? No Was patient intubated pre/post CPR ? pre Was transvenous pacer placed ? np  Medications Administered Include:      Yes/no Amiodarone Yes x 1  Atropine   Calcium   Epinephrine  X 2 doses  Lidocaine   Magnesium   Norepinephrine   Phenylephrine   Sodium bicarbonate X 1  Vasopression    The following were review during resuscitative efforts  ACLS H's and T's  -Hypovolemia  -Hypoxia -Hydrogen Ion excess (acidosis) -Hypoglycemia -Hypokalemia / Hyperkalemia  -Hypothermia  -Tension pneumothorax  -Toxins  -Thrombosis PE / MI  Evaluation Final Status - Was patient successfully resuscitated ? No, hospitalist service reached out to patient's family and relayed that CPR/ACLS was in progress and given multiples attempts at resuscitative efforts with no meaningful chance of recovery, family decided to call off the CODE. Resuscitative effort were immediately terminated per family's request and patient expires immediately at 0302.      [name] DNP, FNP-BC, AGACNP-BC [facility] Pager: [phone] [place] at  [facility]

## 2022-09-17 NOTE — Death Summary Note (Signed)
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, with long-term current use of insulin (HCC)   PAF (paroxysmal atrial fibrillation) ([place])   Mixed hyperlipidemia   Hypothyroidism   Malnutrition of moderate degree   Overweight (BMI 25.0-29.9)   NSTEMI (non-ST elevated myocardial infarction) (HCC)   Pressure ulcer   Leukocytosis   Hyponatremia   Hypokalemia   Hyperkalemia   Cardiac arrest [facility])   Hospital Course: 81 year old female with past medical history of CAD with CABG, PAD, diabetes mellitus, hypertension and recent hospitalization from 10/22 - 10/30 for DKA with complications of non-STEMI and PE presented to the emergency room on morning of 09/08/2023 with chest pain.  At time of admission, noted to have troponins around 15,000, although this was down from greater than 24,000 a week prior.  Cardiology consulted.  Troponins trended downward and by end of day down to 2300.  By following day, patient chest pain-free.  Following admission, BNP checked on evening of 11/2 (given previous history of diastolic heart failure) and found to be markedly elevated at 3600.  Patient seen by cardiology and started on IV Lasix.  Echocardiogram done 11/3 noted markedly depressed ejection fraction of 25 to 30% (as compared to echocardiogram done on 10/22.  Patient ordered more Lasix and cardiology plans to take patient for left and right heart catheterization on 2023-09-13. Patient developed asystole earlier this morning, ACLS was started,  transiently achieved ROSC, and transferred to ICU.  Then developed cardiac arrest again, she died early this morning. Assessment and Plan:  Acute systolic (congestive) heart failure (HCC) Non-STEMI. Coronary artery disease status post CABG. Patient elevated troponin over 24,000 again in this admission.  Repeated echocardiogram showed ejection fraction 25 to 30% with mild to moderate tricuspid regurgitation. Reviewed heart cath results at the last admission, patient had multivessel disease, but  the coronary grafts are patent.  Patient is scheduled to have repeat angiogram on Monday.  Patient has been followed by cardiology, continue heparin drip and symptomatic treatment Patient had significant diuresis 11/5, but she is incontinent, urine output not recorded.  Patient feel better.  But she had a cardiac arrest, and died suddenly.   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  I will discontinue evening dose of insulin glargine.  Reduce sliding scale insulin dose. Patient glucose was running high again, discontinued D10.  Continued sliding scale insulin.   Hyponatremia Hypokalemia Hyperkalemia. Patient potassium increased to 5.5 today with mild worsening renal function.  Recieved a dose of Lokelma, recheck potassium normalized.   Anemia of chronic disease. Anemia of chronic disease.  Hemoglobin still stable.   Mixed hyperlipidemia Continue statin   Malnutrition of moderate degree    Hypothyroidism    Overweight (BMI 25.0-29.9) Meets criteria BMI greater than 25   Leukocytosis Likely UTI. Patient has leukocytosis, elevated procalcitonin level, now incontinent of urine.  Likely UTI, Foley catheter anchored t and  send out UA.  Started Rocephin.      Procedures: ACLS  Consultations: Cardiology  The results of  significant diagnostics from this hospitalization (including imaging, microbiology, ancillary and laboratory) are listed below for reference.   Significant Diagnostic Studies: ECHOCARDIOGRAM LIMITED  Result Date: 08/20/2022    ECHOCARDIOGRAM LIMITED REPORT   Patient Name:   CHANTELLE VERDI Date of Exam: 08/20/2022 Medical Rec #:  [id number]      Height:       68.0 in Accession #:    [id number]     Weight:       164.0 lb Date of Birth:  11-25-1940      BSA:          1.879 m Patient Age:    25 years       BP:           94/51 mmHg Patient Gender: F              HR:           78 bpm. Exam Location:  [facility] Procedure: Limited Echo, Cardiac Doppler and Color Doppler Indications:     Chest pain R07.9  History:         Patient has prior history of Echocardiogram examinations, most                  recent 09/08/2022. Stroke; Risk Factors:Hypertension and                  Dyslipidemia.  Sonographer:     Sherrie Sport Referring Phys:  [id number] CHRISTOPHER END Diagnosing Phys: Ida Rogue MD IMPRESSIONS  1. Left ventricular ejection fraction, by estimation, is 25 to 30%. The left ventricle has severely decreased function. The left ventricle demonstrates regional wall motion abnormalities (global hypokinesis with severe hypokinesis of the anterior,anteroseptal and apical region).  2. Right ventricular systolic function is normal. The right ventricular size is mildly enlarged. There is moderately elevated pulmonary artery systolic pressure. The estimated right ventricular systolic pressure is 55.7 mmHg.  3. Left atrial size was moderately dilated.  4. Large pleural effusion in the left lateral region. Estimated at 7 cm  5. The mitral valve is normal in structure. No evidence of mitral valve regurgitation. No evidence of mitral stenosis. Moderate mitral annular calcification.  6. Tricuspid valve regurgitation is moderate to severe.  7. The aortic valve is tricuspid. Aortic valve regurgitation is not visualized. No aortic stenosis is  present.  8. The inferior vena cava is normal in size with <50% respiratory variability, suggesting right atrial pressure of 8 mmHg. FINDINGS  Left Ventricle: Left ventricular ejection fraction, by estimation, is 25 to 30%. The left ventricle has severely decreased function. The left ventricle demonstrates regional wall motion abnormalities. The left ventricular internal cavity size was normal  in size. There is mild left ventricular hypertrophy. Right Ventricle: The right ventricular size is mildly enlarged. No increase in right ventricular wall thickness. Right ventricular systolic function is normal. There is moderately elevated pulmonary artery systolic pressure. The tricuspid regurgitant velocity is 3.30 m/s, and with an assumed right atrial pressure of 10 mmHg, the estimated right ventricular systolic pressure is 32.2 mmHg. Left Atrium: Left atrial size was moderately dilated. Right Atrium: Right atrial size was normal in size. Pericardium: There is no evidence of pericardial effusion. Mitral Valve: The mitral valve is normal in structure. Moderate mitral annular calcification. No evidence of mitral valve stenosis. Tricuspid Valve: The tricuspid valve is normal in structure.  Tricuspid valve regurgitation is moderate to severe. No evidence of tricuspid stenosis. Aortic Valve: The aortic valve is tricuspid. Aortic valve regurgitation is not visualized. No aortic stenosis is present. Pulmonic Valve: The pulmonic valve was normal in structure. Pulmonic valve regurgitation is mild. No evidence of pulmonic stenosis. Aorta: The aortic root is normal in size and structure. Venous: The inferior vena cava is normal in size with less than 50% respiratory variability, suggesting right atrial pressure of 8 mmHg. IAS/Shunts: No atrial level shunt detected by color flow Doppler. Additional Comments: There is a large pleural effusion in the left lateral region.  LEFT VENTRICLE PLAX 2D LVIDd:         5.20 cm LVIDs:          4.70 cm LV PW:         1.40 cm LV IVS:        1.10 cm LVOT diam:     2.00 cm LVOT Area:     3.14 cm  LV Volumes (MOD) LV vol d, MOD A2C: 126.0 ml LV vol d, MOD A4C: 113.0 ml LV vol s, MOD A2C: 90.0 ml LV vol s, MOD A4C: 74.2 ml LV SV MOD A2C:     36.0 ml LV SV MOD A4C:     113.0 ml LV SV MOD BP:      42.8 ml LEFT ATRIUM             Index        RIGHT ATRIUM           Index LA diam:        4.90 cm 2.61 cm/m   RA Area:     14.70 cm LA Vol (A2C):   48.1 ml 25.60 ml/m  RA Volume:   33.70 ml  17.94 ml/m LA Vol (A4C):   84.9 ml 45.19 ml/m LA Biplane Vol: 64.3 ml 34.23 ml/m                        PULMONIC VALVE AORTA                 PR End Diast Vel: 9.12 msec Ao Root diam: 3.20 cm  TRICUSPID VALVE TR Peak grad:   43.6 mmHg TR Vmax:        330.00 cm/s  SHUNTS Systemic Diam: 2.00 cm Ida Rogue MD Electronically signed by Ida Rogue MD Signature Date/Time: 08/20/2022/1:20:48 PM    Final    [place] EKG SITE RITE  Result Date: 08/19/2022 If Site Rite image not attached, placement could not be confirmed due to current cardiac rhythm.  DG Chest Port 1 View  Result Date: 09/10/2022 CLINICAL DATA:  Chest pain. EXAM: PORTABLE CHEST 1 VIEW COMPARISON:  August 10, 2022. FINDINGS: The heart size and mediastinal contours are within normal limits. Both lungs are clear. Status post coronary bypass graft. The visualized skeletal structures are unremarkable. IMPRESSION: No active disease. Electronically Signed   By: Marijo Conception M.D.   On: 09/16/2022 17:57   CARDIAC CATHETERIZATION  Result Date: 08/17/2022   Mid LM to Ost LAD lesion is 95% stenosed with 90% stenosed side branch in Ost Cx.   Ost LAD to Prox LAD lesion is 100% stenosed.   Mid LAD lesion is 80% stenosed with 100% stenosed side branch in 2nd Diag.   Ost Cx to Prox Cx lesion is 100% stenosed.   Dist Cx lesion is 70% stenosed.   Prox RCA lesion is  65% stenosed.   LIMA graft was visualized by angiography and is normal in caliber. The graft exhibits no  disease. Widely patent   SVG-D1 graft was visualized by angiography.  The graft exhibits no disease.  Widely patent; diagonal does not retrograde fill the LAD   SVG-OM graft was visualized by angiography and is normal in caliber.  The graft exhibits no disease.Widely patent to a very trivial downstream vessel.   --------------------   LV end diastolic pressure is mildly elevated.   There is no aortic valve stenosis.   -------------------- POSTOP DIAGNOSES Severe disease of the native left coronary artery system with distal Left Main 90%, severe disease in the small AV groove Circumflex with occlusion of the proximal LAD and large dominant OM with minimal competitive flow. Small caliber heavily calcified Right Coronary Artery with proximal 60 to 70% stenosis with explain the level of troponin elevation LVP 163/7 mmHg with Mildly elevated LVEDP 19 to 20 mmHg. Right Heart Cath numbers relatively stable: RAP mean 4 mmHg, RVP-EDP 38/1-6 mmHg, PAP-mean 35/13-22 mmHg, PCWP mean 16 mmHg. Ao sat 95%, PA sat 69%; Cardiac Output-Index 8.25-4.42 (normal) Despite significant elevation of troponin, there is no obvious culprit lesion to explain the patient's symptoms. RECOMMENDATIONS Continue guideline directed medical therapy for CAD Discontinued IV heparin Glenetta Hew, MD  DG Chest Port 1 View  Result Date: 08/10/2022 CLINICAL DATA:  81 year old female with respiratory failure and hypoxia. Sepsis. Intubated. EXAM: PORTABLE CHEST 1 VIEW COMPARISON:  CTA chest 08/08/2022 and earlier. FINDINGS: Portable AP semi upright view at 0419 hours. The patient is more rotated to the right. Endotracheal tube tip is stable at the level the clavicles. Left subclavian vascular catheter is stable. Enteric tube courses to the abdomen, tip not included. Mildly lower lung volumes. Prior CABG. Stable cardiac size and mediastinal contours. No pneumothorax, pleural effusion or consolidation. Mildly increased crowding of markings or vascular  congestion. No overt edema. Stable visualized osseous structures. Prior sternotomy. Negative visible bowel gas. IMPRESSION: 1. Stable lines and tubes. 2. Mildly lower lung volumes with increased crowding of markings or vascular congestion. But no overt edema. Electronically Signed   By: Genevie Ann M.D.   On: 08/10/2022 04:56   US Abdomen Limited RUQ (LIVER/GB)  Result Date: 08/09/2022 CLINICAL DATA:  Elevated LFTs EXAM: ULTRASOUND ABDOMEN LIMITED RIGHT UPPER QUADRANT COMPARISON:  CT abdomen and pelvis 08/08/2022 FINDINGS: Gallbladder: Normally distended with small shadowing calculi up to 5 mm diameter. No definite gallbladder wall thickening or sonographic Murphy sign. Free fluid adjacent to gallbladder. Common bile duct: Diameter: 2 mm Liver: Echogenic parenchyma, likely fatty infiltration though this can be seen with cirrhosis and certain infiltrative disorders. No hepatic mass or nodularity. Portal vein is patent on color Doppler imaging with normal direction of blood flow towards the liver. Other: N/A IMPRESSION: Cholelithiasis. Free fluid adjacent to gallbladder without definite evidence of acute cholecystitis. Probable fatty infiltration of liver as above. Electronically Signed   By: Lavonia Dana M.D.   On: 08/09/2022 11:31   ECHOCARDIOGRAM COMPLETE  Result Date: 08/08/2022    ECHOCARDIOGRAM REPORT   Patient Name:   TUNISIA LANDGREBE Date of Exam: 08/08/2022 Medical Rec #:  [id number]      Height:       68.0 in Accession #:    [id number]     Weight:       140.0 lb Date of Birth:  June 01, 1941      BSA:          1.756  m Patient Age:    69 years       BP:           113/59 mmHg Patient Gender: F              HR:           98 bpm. Exam Location:  [facility] Procedure: 2D Echo Indications:     Diastolic CHF  History:         Patient has prior history of Echocardiogram examinations. CAD,                  Prior CABG, Stroke; Risk Factors:Hypertension.  Sonographer:     L. Thornton-Maynard Referring Phys:  [place] Diagnosing Phys: Skeet Latch MD IMPRESSIONS  1. Left ventricular ejection fraction, by estimation, is 65 to 70%. The left ventricle has normal function. The left ventricle has no regional wall motion abnormalities. There is moderate asymmetric left ventricular hypertrophy of the septal segment. Left ventricular diastolic parameters are consistent with Grade I diastolic dysfunction (impaired relaxation). Elevated left ventricular end-diastolic pressure.  2. Right ventricular systolic function is normal. The right ventricular size is normal. There is mildly elevated pulmonary artery systolic pressure.  3. Left atrial size was mildly dilated.  4. The mitral valve is normal in structure. Trivial mitral valve regurgitation. No evidence of mitral stenosis.  5. Tricuspid valve regurgitation is mild to moderate.  6. The aortic valve is tricuspid. Aortic valve regurgitation is not visualized. No aortic stenosis is present.  7. Aortic dilatation noted. There is mild dilatation of the ascending aorta, measuring 37 mm.  8. The inferior vena cava is dilated in size with <50% respiratory variability, suggesting right atrial pressure of 15 mmHg. FINDINGS  Left Ventricle: Left ventricular ejection fraction, by estimation, is 65 to 70%. The left ventricle has normal function. The left ventricle has no regional wall motion abnormalities. The left ventricular internal cavity size was normal in size. There is  moderate asymmetric left ventricular hypertrophy of the septal segment. Left ventricular diastolic parameters are consistent with Grade I diastolic dysfunction (impaired relaxation). Elevated left ventricular end-diastolic pressure. Right Ventricle: The right ventricular size is normal. No increase in right ventricular wall thickness. Right ventricular systolic function is normal. There is mildly elevated pulmonary artery systolic pressure. The tricuspid regurgitant velocity is 2.55  m/s, and with an assumed  right atrial pressure of 15 mmHg, the estimated right ventricular systolic pressure is 87.5 mmHg. Left Atrium: Left atrial size was mildly dilated. Right Atrium: Right atrial size was normal in size. Pericardium: There is no evidence of pericardial effusion. Mitral Valve: The mitral valve is normal in structure. There is moderate thickening of the anterior mitral valve leaflet(s). Mild mitral annular calcification. Trivial mitral valve regurgitation. No evidence of mitral valve stenosis. MV peak gradient, 9.1 mmHg. The mean mitral valve gradient is 4.0 mmHg. Tricuspid Valve: The tricuspid valve is normal in structure. Tricuspid valve regurgitation is mild to moderate. No evidence of tricuspid stenosis. Aortic Valve: The aortic valve is tricuspid. Aortic valve regurgitation is not visualized. No aortic stenosis is present. Aortic valve mean gradient measures 8.0 mmHg. Aortic valve peak gradient measures 14.4 mmHg. Aortic valve area, by VTI measures 2.01  cm. Pulmonic Valve: The pulmonic valve was normal in structure. Pulmonic valve regurgitation is trivial. No evidence of pulmonic stenosis. Aorta: Aortic dilatation noted. There is mild dilatation of the ascending aorta, measuring 37 mm. Venous: The inferior vena cava is dilated  in size with less than 50% respiratory variability, suggesting right atrial pressure of 15 mmHg. IAS/Shunts: No atrial level shunt detected by color flow Doppler.  LEFT VENTRICLE PLAX 2D LVIDd:         3.70 cm     Diastology LVIDs:         2.10 cm     LV e' medial:    4.30 cm/s LV PW:         1.00 cm     LV E/e' medial:  27.0 LV IVS:        1.30 cm     LV e' lateral:   6.32 cm/s LVOT diam:     1.80 cm     LV E/e' lateral: 18.4 LV SV:         56 LV SV Index:   32 LVOT Area:     2.54 cm  LV Volumes (MOD) LV vol d, MOD A2C: 43.8 ml LV vol d, MOD A4C: 41.0 ml LV vol s, MOD A2C: 16.3 ml LV vol s, MOD A4C: 12.9 ml LV SV MOD A2C:     27.5 ml LV SV MOD A4C:     41.0 ml LV SV MOD BP:      28.0 ml  RIGHT VENTRICLE RV Basal diam:  2.90 cm RV S prime:     7.30 cm/s TAPSE (M-mode): 0.9 cm LEFT ATRIUM             Index        RIGHT ATRIUM           Index LA diam:        3.10 cm 1.77 cm/m   RA Area:     14.50 cm LA Vol (A2C):   66.0 ml 37.58 ml/m  RA Volume:   34.40 ml  19.59 ml/m LA Vol (A4C):   57.2 ml 32.57 ml/m LA Biplane Vol: 61.6 ml 35.07 ml/m  AORTIC VALVE                     PULMONIC VALVE AV Area (Vmax):    2.02 cm      PV Vmax:          1.08 m/s AV Area (Vmean):   2.01 cm      PV Peak grad:     4.6 mmHg AV Area (VTI):     2.01 cm      PR End Diast Vel: 5.06 msec AV Vmax:           190.00 cm/s AV Vmean:          128.000 cm/s AV VTI:            0.277 m AV Peak Grad:      14.4 mmHg AV Mean Grad:      8.0 mmHg LVOT Vmax:         151.00 cm/s LVOT Vmean:        101.000 cm/s LVOT VTI:          0.219 m LVOT/AV VTI ratio: 0.79  AORTA Ao Root diam: 3.10 cm Ao Asc diam:  3.50 cm MITRAL VALVE                TRICUSPID VALVE MV Area (PHT): 3.54 cm     TR Peak grad:   26.0 mmHg MV Area VTI:   1.49 cm     TR Vmax:        255.00 cm/s MV Peak grad:  9.1  mmHg MV Mean grad:  4.0 mmHg     SHUNTS MV Vmax:       1.51 m/s     Systemic VTI:  0.22 m MV Vmean:      91.2 cm/s    Systemic Diam: 1.80 cm MV Decel Time: 214 msec MV E velocity: 116.00 cm/s MV A velocity: 160.00 cm/s MV E/A ratio:  0.72 Skeet Latch MD Electronically signed by Skeet Latch MD Signature Date/Time: 08/08/2022/1:23:04 PM    Final    CT Angio Chest Pulmonary Embolism (PE) W or WO Contrast  Result Date: 08/08/2022 CLINICAL DATA:  Sepsis of unknown source. EXAM: CT ANGIOGRAPHY CHEST CT ABDOMEN AND PELVIS WITH CONTRAST TECHNIQUE: Multidetector CT imaging of the chest was performed using the standard protocol during bolus administration of intravenous contrast. Multiplanar CT image reconstructions and MIPs were obtained to evaluate the vascular anatomy. Multidetector CT imaging of the abdomen and pelvis was performed using the standard  protocol during bolus administration of intravenous contrast. RADIATION DOSE REDUCTION: This exam was performed according to the departmental dose-optimization program which includes automated exposure control, adjustment of the mA and/or kV according to patient size and/or use of iterative reconstruction technique. CONTRAST:  42m OMNIPAQUE IOHEXOL 350 MG/ML SOLN COMPARISON:  Chest CT with contrast 10/04/2004. No prior abdomen pelvis CT. Most recently, 2 portable chest films obtained today, portable chest 05/10/2022 FINDINGS: CTA CHEST FINDINGS Support devices: ETT in place 3.4 cm from the carina. NGT curves to the left in the stomach with the tip abutting the proximal fundal wall. Left IJ line terminates at about the superior cavoatrial junction. Cardiovascular: Interval CABG with healed sternotomy. Native coronary arteries have become heavily calcified since the previous study in the heart has undergone mild-to-moderate interval enlargement, with pan chamber involvement. There is increased distention of the central pulmonary veins. No pericardial effusion is seen. The mitral ring has become heavily calcified except for medially where there is only minimal calcification. The pulmonary trunk, as before is prominent measuring 3.5 cm indicating arterial hypertension, but no arterial embolus is seen. There are increased calcific plaques in the aorta, proximal subclavian or. There is no aortic aneurysm, stenosis or dissection. Mediastinum/Nodes: There is tortuous but otherwise unremarkable thoracic esophagus. Thoracic trachea clear except for the T2. The main bronchi are clear. There is no intrathoracic or axillary adenopathy or thyroid mass. Lungs/Pleura: There are trace layering pleural effusions. There is interval increased bilateral lower lobe bronchiectasis and posterior basal bronchiolectasis, but no appreciable impacted bronchi. There is subpleural atelectasis in the posterior extreme lung bases. Minimal  interstitial edema is noted in the lung bases and apices. There is a 6 mm ground-glass nodule posteriorly in the right upper lobe lung apex on 3:39. There is a 4 mm subpleural ground-glass nodule laterally in the right middle lobe on 3:98. No confluent pneumonia or other focal lung opacity is seen. 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 No acute or significant osseous findings. There is body wall edema in the chest wall. Review of the MIP images confirms the above findings. CT ABDOMEN and PELVIS FINDINGS Hepatobiliary: The liver is mildly steatotic. The intrahepatic periportal edema. Small amount of perihepatic/pericholecystic ascites. The gallbladder free wall slightly thickened and there are stones in the proximal lumen without biliary dilatation. No liver mass is seen. 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 No acute inflammatory change is seen, no mass enhancement. Spleen: Normal in size with homogeneous enhancement. Adrenals/Urinary Tract:  New finding of 1.9 x 1.6 cm left adrenal nodule measuring 58 Hounsfield units on this postcontrast study. Adrenal dedicated MRI recommended when clinically feasible. Right adrenals unremarkable. There is a 2.2 cm homogeneous thin walled cyst in the upper pole of the left kidney of 9 Hounsfield units. No follow-up imaging is recommended. There are few additional too small to characterize hypodensities in the left kidney. Right kidney is unremarkable. There are bilateral linear as well rounded calcifications scattered along both renal collecting systems. Most of this appears to be renovascular calcification but there may be a few small nonobstructive caliceal stones in both kidneys. There is no ureteral stone or hydronephrosis to the level of the left acetabulum, below which the remainder of the distal ureters and bladder are obscured by extensive spray artifact  from a left hip replacement. In the delayed phase, no contrast is seen in either collecting system. Stomach/Bowel: No dilatation or wall thickening including of the appendix. Sigmoid diverticulosis without evidence of diverticulitis. The rectum largely obscured by her left hip replacement. Vascular/Lymphatic: There is heavy aortoiliac calcific plaque without AAA. No adenopathy is seen. Reproductive: Status post hysterectomy. No adnexal masses. Other: There is mild-to-moderate body wall anasarca. There is generalized mesenteric congestion. Minimal ascites abdomen and pelvis. No free air, abscess or hemorrhage. Musculoskeletal: Mild osteopenia and mild degenerative change lumbar spine. Left hip arthroplasty. Review of the MIP images confirms the above findings. IMPRESSION: 1. Chronically dilated pulmonary trunk. No arterial embolus is seen. 2. Cardiomegaly with venous distention, slight interstitial edema in the lung apices and bases, minimal pleural effusions and body wall anasarca. 3. Mesenteric congestion and minimal ascites in the abdomen and pelvis. 4. Increased bronchiectasis and bronchiolectasis in both lower lobes since 2005 but no active infiltrate or bronchial impactions. 5. 6 mm and 4 mm right lung ground-glass nodules. Recommend follow-up chest CT 3-6 months. If stable, repeat follow-up CT 18-24 months then at 2 year intervals until 5 years of stability is documented. Consider resection if either nodule develops solid components or substantially grows. These guidelines do not apply to cancer patients and immunocompromised patients. 6. CABG with native CAD, aortoiliac atherosclerosis. 7. Pericholecystic fluid and slightly thickened gallbladder wall with stones. This most likely congestive etiology but please correlate clinically to exclude cholecystitis. No other appreciable source for sepsis is seen. 8. 1.9 x 1.6 cm heterogeneous left adrenal nodule not seen in 2005. Further evaluation recommended. 9.  Linear and rounded calcifications along the renal collecting systems, most of which are probably renovascular calcifications. There may be a few small caliceal stones. 10. No collecting system contrast in the delayed phase. This could be on a congestive basis or due to heart failure, but could also be seen with nephrotoxicity. Laboratory and clinical correlation advised. 11. Remaining findings discussed above. Electronically Signed   By: Telford Nab M.D.   On: 08/08/2022 05:31   CT ABDOMEN PELVIS W CONTRAST  Result Date: 08/08/2022 CLINICAL DATA:  Sepsis of unknown source. EXAM: CT ANGIOGRAPHY CHEST CT ABDOMEN AND PELVIS WITH CONTRAST TECHNIQUE: Multidetector CT imaging of the chest was performed using the standard protocol during bolus administration of intravenous contrast. Multiplanar CT image reconstructions and MIPs were obtained to evaluate the vascular anatomy. Multidetector CT imaging of the abdomen and pelvis was performed using the standard protocol during bolus administration of intravenous contrast. RADIATION DOSE REDUCTION: This exam was performed according to the departmental dose-optimization program which includes automated exposure control, adjustment of the mA and/or kV according to patient size  and/or use of iterative reconstruction technique. CONTRAST:  84m OMNIPAQUE IOHEXOL 350 MG/ML SOLN COMPARISON:  Chest CT with contrast 10/04/2004. No prior abdomen pelvis CT. Most recently, 2 portable chest films obtained today, portable chest 05/10/2022 FINDINGS: CTA CHEST FINDINGS Support devices: ETT in place 3.4 cm from the carina. NGT curves to the left in the stomach with the tip abutting the proximal fundal wall. Left IJ line terminates at about the superior cavoatrial junction. Cardiovascular: Interval CABG with healed sternotomy. Native coronary arteries have become heavily calcified since the previous study in the heart has undergone mild-to-moderate interval enlargement, with pan chamber  involvement. There is increased distention of the central pulmonary veins. No pericardial effusion is seen. The mitral ring has become heavily calcified except for medially where there is only minimal calcification. The pulmonary trunk, as before is prominent measuring 3.5 cm indicating arterial hypertension, but no arterial embolus is seen. There are increased calcific plaques in the aorta, proximal subclavian or. There is no aortic aneurysm, stenosis or dissection. Mediastinum/Nodes: There is tortuous but otherwise unremarkable thoracic esophagus. Thoracic trachea clear except for the T2. The main bronchi are clear. There is no intrathoracic or axillary adenopathy or thyroid mass. Lungs/Pleura: There are trace layering pleural effusions. There is interval increased bilateral lower lobe bronchiectasis and posterior basal bronchiolectasis, but no appreciable impacted bronchi. There is subpleural atelectasis in the posterior extreme lung bases. Minimal interstitial edema is noted in the lung bases and apices. There is a 6 mm ground-glass nodule posteriorly in the right upper lobe lung apex on 3:39. There is a 4 mm subpleural ground-glass nodule laterally in the right middle lobe on 3:98. No confluent pneumonia or other focal lung opacity is seen. 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 No acute or significant osseous findings. There is body wall edema in the chest wall. Review of the MIP images confirms the above findings. CT ABDOMEN and PELVIS FINDINGS Hepatobiliary: The liver is mildly steatotic. The intrahepatic periportal edema. Small amount of perihepatic/pericholecystic ascites. The gallbladder free wall slightly thickened and there are stones in the proximal lumen without biliary dilatation. No liver mass is seen. 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 No acute inflammatory change is seen, no mass  enhancement. Spleen: Normal in size with homogeneous enhancement. Adrenals/Urinary Tract: New finding of 1.9 x 1.6 cm left adrenal nodule measuring 58 Hounsfield units on this postcontrast study. Adrenal dedicated MRI recommended when clinically feasible. Right adrenals unremarkable. There is a 2.2 cm homogeneous thin walled cyst in the upper pole of the left kidney of 9 Hounsfield units. No follow-up imaging is recommended. There are few additional too small to characterize hypodensities in the left kidney. Right kidney is unremarkable. There are bilateral linear as well rounded calcifications scattered along both renal collecting systems. Most of this appears to be renovascular calcification but there may be a few small nonobstructive caliceal stones in both kidneys. There is no ureteral stone or hydronephrosis to the level of the left acetabulum, below which the remainder of the distal ureters and bladder are obscured by extensive spray artifact from a left hip replacement. In the delayed phase, no contrast is seen in either collecting system. Stomach/Bowel: No dilatation or wall thickening including of the appendix. Sigmoid diverticulosis without evidence of diverticulitis. The rectum largely obscured by her left hip replacement. Vascular/Lymphatic: There is heavy aortoiliac calcific plaque without AAA. No adenopathy is seen. Reproductive: Status post hysterectomy. No adnexal masses.  Other: There is mild-to-moderate body wall anasarca. There is generalized mesenteric congestion. Minimal ascites abdomen and pelvis. No free air, abscess or hemorrhage. Musculoskeletal: Mild osteopenia and mild degenerative change lumbar spine. Left hip arthroplasty. Review of the MIP images confirms the above findings. IMPRESSION: 1. Chronically dilated pulmonary trunk. No arterial embolus is seen. 2. Cardiomegaly with venous distention, slight interstitial edema in the lung apices and bases, minimal pleural effusions and body wall  anasarca. 3. Mesenteric congestion and minimal ascites in the abdomen and pelvis. 4. Increased bronchiectasis and bronchiolectasis in both lower lobes since 2005 but no active infiltrate or bronchial impactions. 5. 6 mm and 4 mm right lung ground-glass nodules. Recommend follow-up chest CT 3-6 months. If stable, repeat follow-up CT 18-24 months then at 2 year intervals until 5 years of stability is documented. Consider resection if either nodule develops solid components or substantially grows. These guidelines do not apply to cancer patients and immunocompromised patients. 6. CABG with native CAD, aortoiliac atherosclerosis. 7. Pericholecystic fluid and slightly thickened gallbladder wall with stones. This most likely congestive etiology but please correlate clinically to exclude cholecystitis. No other appreciable source for sepsis is seen. 8. 1.9 x 1.6 cm heterogeneous left adrenal nodule not seen in 2005. Further evaluation recommended. 9. Linear and rounded calcifications along the renal collecting systems, most of which are probably renovascular calcifications. There may be a few small caliceal stones. 10. No collecting system contrast in the delayed phase. This could be on a congestive basis or due to heart failure, but could also be seen with nephrotoxicity. Laboratory and clinical correlation advised. 11. Remaining findings discussed above. Electronically Signed   By: Telford Nab M.D.   On: 08/08/2022 05:31   DG Chest Port 1 View  Result Date: 08/08/2022 CLINICAL DATA:  Respiratory failure EXAM: PORTABLE CHEST 1 VIEW COMPARISON:  2:20 a.m. FINDINGS: Endotracheal tube 4.9 cm above the carina. Nasogastric tube extends into the upper abdomen beyond the margin of the examination. Left subclavian central venous catheter tip noted at the superior cavoatrial junction. Lungs are clear. No pneumothorax or pleural effusion. Coronary artery bypass grafting has been performed. Cardiac size is within normal  limits. Pulmonary vascularity is normal. No acute bone abnormality. IMPRESSION: 1. Support lines and tubes in appropriate position. 2. No active disease. Electronically Signed   By: Fidela Salisbury M.D.   On: 08/08/2022 03:51   CT HEAD WO CONTRAST (5MM)  Result Date: 08/08/2022 CLINICAL DATA:  Altered mental status EXAM: CT HEAD WITHOUT CONTRAST TECHNIQUE: Contiguous axial images were obtained from the base of the skull through the vertex without intravenous contrast. RADIATION DOSE REDUCTION: This exam was performed according to the departmental dose-optimization program which includes automated exposure control, adjustment of the mA and/or kV according to patient size and/or use of iterative reconstruction technique. COMPARISON:  05/11/2022 FINDINGS: Brain: Imaging is limited by motion artifact. Mild parenchymal volume loss is commensurate with the patient's age. Moderate periventricular white matter changes are present likely reflecting the sequela of small vessel ischemia, stable since prior examination. No evidence of acute intracranial hemorrhage or infarct. No abnormal mass effect or midline shift. No abnormal intra or extra-axial mass lesion. Ventricular size is normal. Cerebellum is unremarkable. Vascular: No hyperdense vessel or unexpected calcification. Skull: Normal. Negative for fracture or focal lesion. Sinuses/Orbits: No acute finding. Other: Mastoid air cells and middle ear cavities are clear. IMPRESSION: Limited examination. Stable senescent changes. No definite acute intracranial hemorrhage or infarct. Electronically Signed   By: Cassandria Anger  Christa See M.D.   On: 08/08/2022 03:11   DG Chest Port 1 View  Result Date: 08/08/2022 CLINICAL DATA:  Questionable sepsis. EXAM: PORTABLE CHEST 1 VIEW COMPARISON:  Portable chest 05/10/2022 FINDINGS: Mild cardiomegaly. Old CABG. No vascular congestion is seen. The mitral ring is heavily calcified. There are scattered aortic calcifications with unremarkable  mediastinal configuration. The lungs are clear. No pleural effusion is seen. There is thoracic spondylosis. IMPRESSION: No evidence of acute chest process or interval changes. Stable post CABG chest with cardiomegaly. Electronically Signed   By: Telford Nab M.D.   On: 08/08/2022 02:32    Microbiology: No results found for this or any previous visit (from the past 240 hour(s)).  Time spent: 35 minutes  Signed: Sharen Hones, MD 08-31-22

## 2022-09-17 DEATH — deceased
